# Patient Record
Sex: Male | Born: 1950
Health system: Southern US, Community
[De-identification: ages and names within clinical notes are randomized; demographics above are authoritative.]

## PROBLEM LIST (undated history)

## (undated) DIAGNOSIS — M545 Low back pain, unspecified: Secondary | ICD-10-CM

## (undated) DIAGNOSIS — G5600 Carpal tunnel syndrome, unspecified upper limb: Secondary | ICD-10-CM

## (undated) DIAGNOSIS — IMO0001 Reserved for inherently not codable concepts without codable children: Secondary | ICD-10-CM

## (undated) DIAGNOSIS — E669 Obesity, unspecified: Secondary | ICD-10-CM

## (undated) DIAGNOSIS — I10 Essential (primary) hypertension: Secondary | ICD-10-CM

## (undated) DIAGNOSIS — D693 Immune thrombocytopenic purpura: Secondary | ICD-10-CM

## (undated) DIAGNOSIS — M549 Dorsalgia, unspecified: Secondary | ICD-10-CM

## (undated) DIAGNOSIS — I2510 Atherosclerotic heart disease of native coronary artery without angina pectoris: Secondary | ICD-10-CM

## (undated) DIAGNOSIS — K219 Gastro-esophageal reflux disease without esophagitis: Secondary | ICD-10-CM

## (undated) DIAGNOSIS — S064XAA Epidural hemorrhage with loss of consciousness status unknown, initial encounter: Secondary | ICD-10-CM

## (undated) DIAGNOSIS — J189 Pneumonia, unspecified organism: Secondary | ICD-10-CM

## (undated) DIAGNOSIS — Z8719 Personal history of other diseases of the digestive system: Secondary | ICD-10-CM

## (undated) DIAGNOSIS — Z862 Personal history of diseases of the blood and blood-forming organs and certain disorders involving the immune mechanism: Secondary | ICD-10-CM

## (undated) DIAGNOSIS — G8929 Other chronic pain: Secondary | ICD-10-CM

## (undated) DIAGNOSIS — I25119 Atherosclerotic heart disease of native coronary artery with unspecified angina pectoris: Secondary | ICD-10-CM

## (undated) DIAGNOSIS — E7800 Pure hypercholesterolemia, unspecified: Secondary | ICD-10-CM

## (undated) DIAGNOSIS — E039 Hypothyroidism, unspecified: Secondary | ICD-10-CM

## (undated) DIAGNOSIS — I5042 Chronic combined systolic (congestive) and diastolic (congestive) heart failure: Secondary | ICD-10-CM

## (undated) DIAGNOSIS — T7840XA Allergy, unspecified, initial encounter: Secondary | ICD-10-CM

## (undated) DIAGNOSIS — Z951 Presence of aortocoronary bypass graft: Secondary | ICD-10-CM

## (undated) DIAGNOSIS — I219 Acute myocardial infarction, unspecified: Secondary | ICD-10-CM

## (undated) DIAGNOSIS — IMO0002 Reserved for concepts with insufficient information to code with codable children: Secondary | ICD-10-CM

## (undated) HISTORY — DX: Carpal tunnel syndrome, unspecified upper limb: G56.00

## (undated) HISTORY — PX: NECK SURGERY: SHX720

## (undated) HISTORY — PX: COLONOSCOPY: SHX174

## (undated) HISTORY — DX: Allergy, unspecified, initial encounter: T78.40XA

## (undated) HISTORY — PX: BACK SURGERY: SHX140

## (undated) HISTORY — DX: Low back pain, unspecified: M54.50

## (undated) HISTORY — PX: CORONARY ANGIOPLASTY WITH STENT PLACEMENT: SHX49

## (undated) HISTORY — DX: Low back pain: M54.5

## (undated) HISTORY — DX: Hypothyroidism, unspecified: E03.9

---

## 1998-07-15 ENCOUNTER — Ambulatory Visit (HOSPITAL_COMMUNITY): Admission: RE | Admit: 1998-07-15 | Discharge: 1998-07-15 | Payer: Self-pay | Admitting: Neurosurgery

## 1998-07-15 ENCOUNTER — Encounter: Payer: Self-pay | Admitting: Neurosurgery

## 1998-07-20 ENCOUNTER — Encounter: Payer: Self-pay | Admitting: Neurosurgery

## 1998-07-21 ENCOUNTER — Inpatient Hospital Stay (HOSPITAL_COMMUNITY): Admission: RE | Admit: 1998-07-21 | Discharge: 1998-07-22 | Payer: Self-pay | Admitting: Neurosurgery

## 1998-07-21 ENCOUNTER — Encounter: Payer: Self-pay | Admitting: Neurosurgery

## 1999-03-18 ENCOUNTER — Ambulatory Visit (HOSPITAL_COMMUNITY): Admission: RE | Admit: 1999-03-18 | Discharge: 1999-03-18 | Payer: Self-pay | Admitting: Neurosurgery

## 1999-03-18 ENCOUNTER — Encounter: Payer: Self-pay | Admitting: Neurosurgery

## 1999-05-27 ENCOUNTER — Observation Stay (HOSPITAL_COMMUNITY): Admission: AD | Admit: 1999-05-27 | Discharge: 1999-05-28 | Payer: Self-pay | Admitting: *Deleted

## 1999-05-27 ENCOUNTER — Encounter: Payer: Self-pay | Admitting: *Deleted

## 1999-09-17 ENCOUNTER — Encounter: Payer: Self-pay | Admitting: Neurosurgery

## 1999-09-17 ENCOUNTER — Ambulatory Visit (HOSPITAL_COMMUNITY): Admission: RE | Admit: 1999-09-17 | Discharge: 1999-09-18 | Payer: Self-pay | Admitting: Neurosurgery

## 2000-05-04 ENCOUNTER — Encounter: Payer: Self-pay | Admitting: Neurosurgery

## 2000-05-04 ENCOUNTER — Ambulatory Visit (HOSPITAL_COMMUNITY): Admission: RE | Admit: 2000-05-04 | Discharge: 2000-05-04 | Payer: Self-pay | Admitting: Neurosurgery

## 2000-05-18 ENCOUNTER — Ambulatory Visit (HOSPITAL_COMMUNITY): Admission: RE | Admit: 2000-05-18 | Discharge: 2000-05-18 | Payer: Self-pay | Admitting: Neurosurgery

## 2000-05-18 ENCOUNTER — Encounter: Payer: Self-pay | Admitting: Neurosurgery

## 2000-06-02 ENCOUNTER — Ambulatory Visit (HOSPITAL_COMMUNITY): Admission: RE | Admit: 2000-06-02 | Discharge: 2000-06-02 | Payer: Self-pay | Admitting: Neurosurgery

## 2000-06-02 ENCOUNTER — Encounter: Payer: Self-pay | Admitting: Neurosurgery

## 2000-08-15 ENCOUNTER — Encounter: Payer: Self-pay | Admitting: Neurosurgery

## 2000-08-15 ENCOUNTER — Ambulatory Visit (HOSPITAL_COMMUNITY): Admission: RE | Admit: 2000-08-15 | Discharge: 2000-08-15 | Payer: Self-pay | Admitting: Neurosurgery

## 2000-12-12 ENCOUNTER — Ambulatory Visit (HOSPITAL_COMMUNITY): Admission: RE | Admit: 2000-12-12 | Discharge: 2000-12-12 | Payer: Self-pay | Admitting: Neurosurgery

## 2000-12-12 ENCOUNTER — Encounter: Payer: Self-pay | Admitting: Neurosurgery

## 2000-12-19 ENCOUNTER — Ambulatory Visit: Admission: RE | Admit: 2000-12-19 | Discharge: 2000-12-19 | Payer: Self-pay | Admitting: Neurosurgery

## 2001-01-03 ENCOUNTER — Encounter: Payer: Self-pay | Admitting: Neurosurgery

## 2001-01-05 ENCOUNTER — Inpatient Hospital Stay (HOSPITAL_COMMUNITY): Admission: RE | Admit: 2001-01-05 | Discharge: 2001-01-10 | Payer: Self-pay | Admitting: Neurosurgery

## 2001-01-05 ENCOUNTER — Encounter: Payer: Self-pay | Admitting: Neurosurgery

## 2001-02-06 ENCOUNTER — Encounter (HOSPITAL_COMMUNITY): Admission: RE | Admit: 2001-02-06 | Discharge: 2001-03-08 | Payer: Self-pay | Admitting: Neurosurgery

## 2001-07-24 ENCOUNTER — Encounter: Payer: Self-pay | Admitting: *Deleted

## 2001-07-24 ENCOUNTER — Inpatient Hospital Stay (HOSPITAL_COMMUNITY): Admission: EM | Admit: 2001-07-24 | Discharge: 2001-07-26 | Payer: Self-pay | Admitting: *Deleted

## 2001-08-01 ENCOUNTER — Encounter (HOSPITAL_COMMUNITY): Admission: RE | Admit: 2001-08-01 | Discharge: 2001-08-31 | Payer: Self-pay | Admitting: Hematology & Oncology

## 2001-08-01 ENCOUNTER — Encounter: Admission: RE | Admit: 2001-08-01 | Discharge: 2001-08-01 | Payer: Self-pay | Admitting: Hematology & Oncology

## 2001-09-03 ENCOUNTER — Encounter: Admission: RE | Admit: 2001-09-03 | Discharge: 2001-09-03 | Payer: Self-pay | Admitting: Oncology

## 2001-09-03 ENCOUNTER — Encounter (HOSPITAL_COMMUNITY): Admission: RE | Admit: 2001-09-03 | Discharge: 2001-10-03 | Payer: Self-pay | Admitting: Oncology

## 2001-12-31 ENCOUNTER — Encounter: Admission: RE | Admit: 2001-12-31 | Discharge: 2001-12-31 | Payer: Self-pay | Admitting: Oncology

## 2001-12-31 ENCOUNTER — Encounter (HOSPITAL_COMMUNITY): Admission: RE | Admit: 2001-12-31 | Discharge: 2002-01-30 | Payer: Self-pay | Admitting: Oncology

## 2002-01-15 ENCOUNTER — Ambulatory Visit (HOSPITAL_COMMUNITY): Admission: RE | Admit: 2002-01-15 | Discharge: 2002-01-15 | Payer: Self-pay | Admitting: Neurosurgery

## 2002-01-15 ENCOUNTER — Encounter: Payer: Self-pay | Admitting: Neurosurgery

## 2002-01-29 ENCOUNTER — Encounter: Admission: RE | Admit: 2002-01-29 | Discharge: 2002-01-29 | Payer: Self-pay | Admitting: Neurosurgery

## 2002-01-29 ENCOUNTER — Encounter: Payer: Self-pay | Admitting: Neurosurgery

## 2002-02-12 ENCOUNTER — Encounter: Admission: RE | Admit: 2002-02-12 | Discharge: 2002-02-12 | Payer: Self-pay | Admitting: Neurosurgery

## 2002-02-12 ENCOUNTER — Encounter: Payer: Self-pay | Admitting: Neurosurgery

## 2002-02-26 ENCOUNTER — Encounter: Admission: RE | Admit: 2002-02-26 | Discharge: 2002-02-26 | Payer: Self-pay | Admitting: Neurosurgery

## 2002-02-26 ENCOUNTER — Encounter: Payer: Self-pay | Admitting: Neurosurgery

## 2002-03-10 ENCOUNTER — Emergency Department (HOSPITAL_COMMUNITY): Admission: EM | Admit: 2002-03-10 | Discharge: 2002-03-10 | Payer: Self-pay | Admitting: Internal Medicine

## 2002-03-10 ENCOUNTER — Inpatient Hospital Stay (HOSPITAL_COMMUNITY): Admission: EM | Admit: 2002-03-10 | Discharge: 2002-03-12 | Payer: Self-pay | Admitting: *Deleted

## 2002-03-10 ENCOUNTER — Encounter: Payer: Self-pay | Admitting: Internal Medicine

## 2002-05-24 ENCOUNTER — Observation Stay (HOSPITAL_COMMUNITY): Admission: EM | Admit: 2002-05-24 | Discharge: 2002-05-24 | Payer: Self-pay | Admitting: *Deleted

## 2002-07-02 ENCOUNTER — Encounter: Admission: RE | Admit: 2002-07-02 | Discharge: 2002-07-02 | Payer: Self-pay | Admitting: Oncology

## 2002-07-19 ENCOUNTER — Encounter: Payer: Self-pay | Admitting: Cardiology

## 2002-07-19 ENCOUNTER — Ambulatory Visit (HOSPITAL_COMMUNITY): Admission: RE | Admit: 2002-07-19 | Discharge: 2002-07-20 | Payer: Self-pay | Admitting: *Deleted

## 2002-07-25 ENCOUNTER — Emergency Department (HOSPITAL_COMMUNITY): Admission: EM | Admit: 2002-07-25 | Discharge: 2002-07-26 | Payer: Self-pay | Admitting: Emergency Medicine

## 2002-07-26 ENCOUNTER — Ambulatory Visit (HOSPITAL_COMMUNITY): Admission: RE | Admit: 2002-07-26 | Discharge: 2002-07-26 | Payer: Self-pay | Admitting: Cardiology

## 2002-08-07 ENCOUNTER — Ambulatory Visit (HOSPITAL_COMMUNITY): Admission: RE | Admit: 2002-08-07 | Discharge: 2002-08-07 | Payer: Self-pay | Admitting: *Deleted

## 2002-09-14 ENCOUNTER — Emergency Department (HOSPITAL_COMMUNITY): Admission: EM | Admit: 2002-09-14 | Discharge: 2002-09-14 | Payer: Self-pay | Admitting: *Deleted

## 2002-10-09 ENCOUNTER — Ambulatory Visit (HOSPITAL_COMMUNITY): Admission: RE | Admit: 2002-10-09 | Discharge: 2002-10-09 | Payer: Self-pay | Admitting: *Deleted

## 2002-10-09 ENCOUNTER — Encounter: Payer: Self-pay | Admitting: *Deleted

## 2002-10-17 ENCOUNTER — Ambulatory Visit (HOSPITAL_COMMUNITY): Admission: RE | Admit: 2002-10-17 | Discharge: 2002-10-17 | Payer: Self-pay | Admitting: Neurosurgery

## 2002-10-17 ENCOUNTER — Encounter: Payer: Self-pay | Admitting: Neurosurgery

## 2002-11-27 ENCOUNTER — Inpatient Hospital Stay (HOSPITAL_COMMUNITY): Admission: RE | Admit: 2002-11-27 | Discharge: 2002-11-30 | Payer: Self-pay | Admitting: Neurosurgery

## 2002-11-27 ENCOUNTER — Encounter: Payer: Self-pay | Admitting: Neurosurgery

## 2002-11-29 ENCOUNTER — Encounter: Payer: Self-pay | Admitting: Neurosurgery

## 2002-12-31 ENCOUNTER — Encounter: Admission: RE | Admit: 2002-12-31 | Discharge: 2002-12-31 | Payer: Self-pay | Admitting: Oncology

## 2003-03-05 ENCOUNTER — Encounter: Payer: Self-pay | Admitting: Family Medicine

## 2003-03-05 ENCOUNTER — Ambulatory Visit (HOSPITAL_COMMUNITY): Admission: RE | Admit: 2003-03-05 | Discharge: 2003-03-05 | Payer: Self-pay | Admitting: Family Medicine

## 2003-05-09 ENCOUNTER — Encounter: Payer: Self-pay | Admitting: Emergency Medicine

## 2003-05-09 ENCOUNTER — Emergency Department (HOSPITAL_COMMUNITY): Admission: EM | Admit: 2003-05-09 | Discharge: 2003-05-09 | Payer: Self-pay | Admitting: Emergency Medicine

## 2003-06-05 ENCOUNTER — Ambulatory Visit (HOSPITAL_COMMUNITY): Admission: RE | Admit: 2003-06-05 | Discharge: 2003-06-05 | Payer: Self-pay | Admitting: Neurosurgery

## 2003-06-05 ENCOUNTER — Encounter: Payer: Self-pay | Admitting: Neurosurgery

## 2003-06-24 ENCOUNTER — Encounter: Payer: Self-pay | Admitting: Neurosurgery

## 2003-06-24 ENCOUNTER — Encounter: Admission: RE | Admit: 2003-06-24 | Discharge: 2003-06-24 | Payer: Self-pay | Admitting: Neurosurgery

## 2003-07-08 ENCOUNTER — Encounter: Payer: Self-pay | Admitting: Neurosurgery

## 2003-07-08 ENCOUNTER — Encounter: Admission: RE | Admit: 2003-07-08 | Discharge: 2003-07-08 | Payer: Self-pay | Admitting: Neurosurgery

## 2003-10-07 ENCOUNTER — Inpatient Hospital Stay (HOSPITAL_COMMUNITY): Admission: RE | Admit: 2003-10-07 | Discharge: 2003-10-11 | Payer: Self-pay | Admitting: Neurosurgery

## 2004-06-02 ENCOUNTER — Encounter: Admission: RE | Admit: 2004-06-02 | Discharge: 2004-06-02 | Payer: Self-pay | Admitting: Oncology

## 2004-12-24 ENCOUNTER — Ambulatory Visit (HOSPITAL_COMMUNITY): Admission: RE | Admit: 2004-12-24 | Discharge: 2004-12-24 | Payer: Self-pay | Admitting: Family Medicine

## 2005-01-12 ENCOUNTER — Ambulatory Visit: Payer: Self-pay | Admitting: *Deleted

## 2005-01-13 ENCOUNTER — Ambulatory Visit (HOSPITAL_COMMUNITY): Admission: RE | Admit: 2005-01-13 | Discharge: 2005-01-13 | Payer: Self-pay | Admitting: Family Medicine

## 2005-01-26 ENCOUNTER — Ambulatory Visit: Payer: Self-pay | Admitting: Orthopedic Surgery

## 2005-02-18 ENCOUNTER — Ambulatory Visit: Payer: Self-pay | Admitting: Orthopedic Surgery

## 2005-02-18 ENCOUNTER — Ambulatory Visit (HOSPITAL_COMMUNITY): Admission: RE | Admit: 2005-02-18 | Discharge: 2005-02-18 | Payer: Self-pay | Admitting: Orthopedic Surgery

## 2005-02-21 ENCOUNTER — Ambulatory Visit: Payer: Self-pay | Admitting: Orthopedic Surgery

## 2005-02-23 ENCOUNTER — Encounter (HOSPITAL_COMMUNITY): Admission: RE | Admit: 2005-02-23 | Discharge: 2005-03-25 | Payer: Self-pay | Admitting: Orthopedic Surgery

## 2005-03-14 ENCOUNTER — Ambulatory Visit: Payer: Self-pay | Admitting: Orthopedic Surgery

## 2005-04-06 ENCOUNTER — Ambulatory Visit (HOSPITAL_COMMUNITY): Admission: RE | Admit: 2005-04-06 | Discharge: 2005-04-06 | Payer: Self-pay | Admitting: Family Medicine

## 2005-04-20 ENCOUNTER — Ambulatory Visit (HOSPITAL_COMMUNITY): Admission: RE | Admit: 2005-04-20 | Discharge: 2005-04-20 | Payer: Self-pay | Admitting: Neurosurgery

## 2005-07-06 ENCOUNTER — Encounter: Admission: RE | Admit: 2005-07-06 | Discharge: 2005-07-29 | Payer: Self-pay | Admitting: Oncology

## 2005-07-06 ENCOUNTER — Encounter (HOSPITAL_COMMUNITY): Admission: RE | Admit: 2005-07-06 | Discharge: 2005-07-29 | Payer: Self-pay | Admitting: Oncology

## 2005-07-06 ENCOUNTER — Ambulatory Visit (HOSPITAL_COMMUNITY): Payer: Self-pay | Admitting: Oncology

## 2006-07-11 ENCOUNTER — Ambulatory Visit (HOSPITAL_COMMUNITY): Payer: Self-pay | Admitting: Oncology

## 2006-09-15 ENCOUNTER — Ambulatory Visit (HOSPITAL_COMMUNITY): Admission: RE | Admit: 2006-09-15 | Discharge: 2006-09-15 | Payer: Self-pay | Admitting: Family Medicine

## 2007-01-29 ENCOUNTER — Inpatient Hospital Stay (HOSPITAL_COMMUNITY): Admission: AD | Admit: 2007-01-29 | Discharge: 2007-02-02 | Payer: Self-pay | Admitting: Family Medicine

## 2007-03-08 ENCOUNTER — Ambulatory Visit: Payer: Self-pay | Admitting: Orthopedic Surgery

## 2007-03-15 ENCOUNTER — Ambulatory Visit: Payer: Self-pay | Admitting: Orthopedic Surgery

## 2007-04-09 ENCOUNTER — Ambulatory Visit: Payer: Self-pay | Admitting: Orthopedic Surgery

## 2007-07-09 ENCOUNTER — Ambulatory Visit: Payer: Self-pay | Admitting: Orthopedic Surgery

## 2007-07-30 ENCOUNTER — Ambulatory Visit: Admission: RE | Admit: 2007-07-30 | Discharge: 2007-07-30 | Payer: Self-pay | Admitting: Family Medicine

## 2007-09-10 ENCOUNTER — Ambulatory Visit (HOSPITAL_COMMUNITY): Admission: RE | Admit: 2007-09-10 | Discharge: 2007-09-10 | Payer: Self-pay | Admitting: Anesthesiology

## 2007-10-09 DIAGNOSIS — I10 Essential (primary) hypertension: Secondary | ICD-10-CM | POA: Insufficient documentation

## 2007-10-10 ENCOUNTER — Ambulatory Visit: Payer: Self-pay | Admitting: Orthopedic Surgery

## 2007-10-10 DIAGNOSIS — M25569 Pain in unspecified knee: Secondary | ICD-10-CM | POA: Insufficient documentation

## 2007-11-01 DIAGNOSIS — I219 Acute myocardial infarction, unspecified: Secondary | ICD-10-CM

## 2007-11-01 HISTORY — PX: LUMBAR FUSION: SHX111

## 2007-11-01 HISTORY — DX: Acute myocardial infarction, unspecified: I21.9

## 2008-01-16 ENCOUNTER — Ambulatory Visit: Payer: Self-pay | Admitting: Orthopedic Surgery

## 2008-02-18 ENCOUNTER — Ambulatory Visit (HOSPITAL_COMMUNITY): Admission: RE | Admit: 2008-02-18 | Discharge: 2008-02-18 | Payer: Self-pay | Admitting: Family Medicine

## 2008-02-20 ENCOUNTER — Ambulatory Visit: Payer: Self-pay | Admitting: Cardiovascular Disease

## 2008-02-26 ENCOUNTER — Ambulatory Visit: Payer: Self-pay | Admitting: Cardiovascular Disease

## 2008-02-26 ENCOUNTER — Encounter (HOSPITAL_COMMUNITY): Admission: RE | Admit: 2008-02-26 | Discharge: 2008-03-27 | Payer: Self-pay | Admitting: Cardiovascular Disease

## 2008-02-28 ENCOUNTER — Ambulatory Visit (HOSPITAL_COMMUNITY): Admission: RE | Admit: 2008-02-28 | Discharge: 2008-02-28 | Payer: Self-pay | Admitting: Neurosurgery

## 2008-03-05 ENCOUNTER — Inpatient Hospital Stay (HOSPITAL_COMMUNITY): Admission: RE | Admit: 2008-03-05 | Discharge: 2008-03-12 | Payer: Self-pay | Admitting: Neurosurgery

## 2008-04-30 ENCOUNTER — Ambulatory Visit: Payer: Self-pay | Admitting: Orthopedic Surgery

## 2008-04-30 DIAGNOSIS — M25469 Effusion, unspecified knee: Secondary | ICD-10-CM | POA: Insufficient documentation

## 2008-05-29 ENCOUNTER — Ambulatory Visit: Payer: Self-pay | Admitting: Orthopedic Surgery

## 2008-05-29 DIAGNOSIS — IMO0002 Reserved for concepts with insufficient information to code with codable children: Secondary | ICD-10-CM | POA: Insufficient documentation

## 2008-05-29 DIAGNOSIS — M1710 Unilateral primary osteoarthritis, unspecified knee: Secondary | ICD-10-CM

## 2008-07-01 ENCOUNTER — Encounter: Payer: Self-pay | Admitting: Orthopedic Surgery

## 2008-07-23 ENCOUNTER — Ambulatory Visit: Payer: Self-pay | Admitting: Orthopedic Surgery

## 2008-07-23 DIAGNOSIS — G8929 Other chronic pain: Secondary | ICD-10-CM

## 2008-07-23 DIAGNOSIS — M549 Dorsalgia, unspecified: Secondary | ICD-10-CM

## 2008-07-28 ENCOUNTER — Telehealth: Payer: Self-pay | Admitting: Orthopedic Surgery

## 2008-07-29 ENCOUNTER — Encounter: Payer: Self-pay | Admitting: Orthopedic Surgery

## 2008-07-30 ENCOUNTER — Encounter: Payer: Self-pay | Admitting: Orthopedic Surgery

## 2008-08-05 ENCOUNTER — Ambulatory Visit: Payer: Self-pay | Admitting: Orthopedic Surgery

## 2008-08-05 ENCOUNTER — Inpatient Hospital Stay (HOSPITAL_COMMUNITY): Admission: RE | Admit: 2008-08-05 | Discharge: 2008-08-11 | Payer: Self-pay | Admitting: Orthopedic Surgery

## 2008-08-11 ENCOUNTER — Telehealth: Payer: Self-pay | Admitting: Orthopedic Surgery

## 2008-08-11 ENCOUNTER — Encounter: Payer: Self-pay | Admitting: Orthopedic Surgery

## 2008-08-12 ENCOUNTER — Encounter: Payer: Self-pay | Admitting: Orthopedic Surgery

## 2008-08-14 ENCOUNTER — Ambulatory Visit: Payer: Self-pay | Admitting: Orthopedic Surgery

## 2008-08-14 ENCOUNTER — Telehealth: Payer: Self-pay | Admitting: Orthopedic Surgery

## 2008-08-14 DIAGNOSIS — Z96659 Presence of unspecified artificial knee joint: Secondary | ICD-10-CM | POA: Insufficient documentation

## 2008-08-19 ENCOUNTER — Ambulatory Visit: Payer: Self-pay | Admitting: Orthopedic Surgery

## 2008-08-25 ENCOUNTER — Encounter: Payer: Self-pay | Admitting: Orthopedic Surgery

## 2008-08-28 ENCOUNTER — Ambulatory Visit: Payer: Self-pay | Admitting: Orthopedic Surgery

## 2008-09-04 ENCOUNTER — Ambulatory Visit: Payer: Self-pay | Admitting: Orthopedic Surgery

## 2008-09-10 ENCOUNTER — Ambulatory Visit: Payer: Self-pay | Admitting: Orthopedic Surgery

## 2008-09-26 ENCOUNTER — Encounter: Payer: Self-pay | Admitting: Orthopedic Surgery

## 2008-10-02 ENCOUNTER — Ambulatory Visit: Payer: Self-pay | Admitting: Orthopedic Surgery

## 2008-12-31 ENCOUNTER — Ambulatory Visit: Payer: Self-pay | Admitting: Orthopedic Surgery

## 2009-04-29 ENCOUNTER — Emergency Department (HOSPITAL_COMMUNITY): Admission: EM | Admit: 2009-04-29 | Discharge: 2009-04-29 | Payer: Self-pay | Admitting: Emergency Medicine

## 2009-05-05 ENCOUNTER — Encounter: Admission: RE | Admit: 2009-05-05 | Discharge: 2009-05-05 | Payer: Self-pay | Admitting: Neurosurgery

## 2009-06-19 ENCOUNTER — Inpatient Hospital Stay (HOSPITAL_COMMUNITY): Admission: RE | Admit: 2009-06-19 | Discharge: 2009-06-29 | Payer: Self-pay | Admitting: Neurosurgery

## 2009-06-24 ENCOUNTER — Ambulatory Visit: Payer: Self-pay | Admitting: Physical Medicine & Rehabilitation

## 2009-08-05 ENCOUNTER — Encounter: Payer: Self-pay | Admitting: Orthopedic Surgery

## 2009-09-11 ENCOUNTER — Ambulatory Visit (HOSPITAL_BASED_OUTPATIENT_CLINIC_OR_DEPARTMENT_OTHER): Admission: RE | Admit: 2009-09-11 | Discharge: 2009-09-11 | Payer: Self-pay | Admitting: Urology

## 2009-10-31 HISTORY — PX: JOINT REPLACEMENT: SHX530

## 2010-12-28 ENCOUNTER — Other Ambulatory Visit: Payer: Self-pay | Admitting: Anesthesiology

## 2010-12-28 DIAGNOSIS — M79605 Pain in left leg: Secondary | ICD-10-CM

## 2010-12-28 DIAGNOSIS — R29898 Other symptoms and signs involving the musculoskeletal system: Secondary | ICD-10-CM

## 2010-12-29 ENCOUNTER — Ambulatory Visit
Admission: RE | Admit: 2010-12-29 | Discharge: 2010-12-29 | Disposition: A | Discharge status: Home / Self Care | Payer: No Typology Code available for payment source | Source: Ambulatory Visit | Attending: Anesthesiology | Admitting: Anesthesiology

## 2010-12-29 DIAGNOSIS — R29898 Other symptoms and signs involving the musculoskeletal system: Secondary | ICD-10-CM

## 2010-12-29 DIAGNOSIS — M79605 Pain in left leg: Secondary | ICD-10-CM

## 2011-01-19 ENCOUNTER — Other Ambulatory Visit (HOSPITAL_COMMUNITY): Payer: Self-pay | Admitting: Family Medicine

## 2011-01-19 DIAGNOSIS — R600 Localized edema: Secondary | ICD-10-CM

## 2011-01-21 ENCOUNTER — Other Ambulatory Visit (HOSPITAL_COMMUNITY): Payer: No Typology Code available for payment source

## 2011-01-24 ENCOUNTER — Ambulatory Visit (HOSPITAL_COMMUNITY)
Admission: RE | Admit: 2011-01-24 | Discharge: 2011-01-24 | Disposition: A | Discharge status: Home / Self Care | Payer: No Typology Code available for payment source | Source: Ambulatory Visit | Attending: Family Medicine | Admitting: Family Medicine

## 2011-01-24 ENCOUNTER — Encounter (HOSPITAL_COMMUNITY): Payer: Self-pay

## 2011-01-24 DIAGNOSIS — R9389 Abnormal findings on diagnostic imaging of other specified body structures: Secondary | ICD-10-CM | POA: Insufficient documentation

## 2011-01-24 DIAGNOSIS — R935 Abnormal findings on diagnostic imaging of other abdominal regions, including retroperitoneum: Secondary | ICD-10-CM | POA: Insufficient documentation

## 2011-01-24 DIAGNOSIS — R600 Localized edema: Secondary | ICD-10-CM

## 2011-01-24 DIAGNOSIS — R609 Edema, unspecified: Secondary | ICD-10-CM | POA: Insufficient documentation

## 2011-01-24 HISTORY — DX: Essential (primary) hypertension: I10

## 2011-01-24 MED ORDER — IOHEXOL 300 MG/ML  SOLN
100.0000 mL | Freq: Once | INTRAMUSCULAR | Status: AC | PRN
  Administered 2011-01-24: 100 mL via INTRAVENOUS

## 2011-02-02 LAB — POCT I-STAT 4, (NA,K, GLUC, HGB,HCT)
Glucose, Bld: 100 mg/dL — ABNORMAL HIGH (ref 70–99)
HCT: 37 % — ABNORMAL LOW (ref 39.0–52.0)
Hemoglobin: 12.6 g/dL — ABNORMAL LOW (ref 13.0–17.0)
Potassium: 4.2 mEq/L (ref 3.5–5.1)
Sodium: 138 mEq/L (ref 135–145)

## 2011-02-05 LAB — URINALYSIS, ROUTINE W REFLEX MICROSCOPIC
Glucose, UA: NEGATIVE mg/dL
Protein, ur: NEGATIVE mg/dL
Urobilinogen, UA: 1 mg/dL (ref 0.0–1.0)

## 2011-02-05 LAB — CBC
HCT: 38.1 % — ABNORMAL LOW (ref 39.0–52.0)
Hemoglobin: 12.8 g/dL — ABNORMAL LOW (ref 13.0–17.0)
MCV: 95.3 fL (ref 78.0–100.0)
RDW: 14.5 % (ref 11.5–15.5)

## 2011-02-05 LAB — BASIC METABOLIC PANEL
Chloride: 102 mEq/L (ref 96–112)
GFR calc non Af Amer: >60 mL/min (ref >60)
Glucose, Bld: 112 mg/dL — ABNORMAL HIGH (ref 70–99)
Potassium: 4.2 mEq/L (ref 3.5–5.1)
Sodium: 137 mEq/L (ref 135–145)

## 2011-02-05 LAB — URINE CULTURE
Colony Count: NO GROWTH
Culture: NO GROWTH

## 2011-03-15 NOTE — Consult Note (Signed)
NAMEDEAVEN, [name]                 ACCOUNT NO.:  [id number]   MEDICAL RECORD NO.:  [id number]          PATIENT TYPE:  INP   LOCATION:  3040                         FACILITY:  [facility]   PHYSICIAN:  Mark C. Vernie Ammons, M.D.  DATE OF BIRTH:  [birth year]   DATE OF CONSULTATION:  06/25/2009  DATE OF DISCHARGE:                                 CONSULTATION   [name] is a 60 year old male patient, who is known to me.  He is seen  today in hospital consultation for further evaluation of urinary  retention.  The patient has not been able to void spontaneously since he  was admitted to the hospital for an elective lumbar laminectomy and  fusion on June 19, 2009.  He has been experiencing significant pain in  his back and has been undergoing intermittent self-catheterization every  4 hours, but has not begun to void spontaneously.   He has a history of urinary retention, and I saw him in hospital  consultation in March 2000, for this same problem.  Since I have seen  him last, he reports that he has slowly developed a slow, weak stream  with associated hesitancy, but denies spraying or splitting of his  stream.  He reports he has no nocturia and says he can go all day with  only having to urinate twice typically.  This is of mild-to-moderate  severity, and it appears his pain and need for narcotics are modifying  factors.  He also has a history of an elevated PSA, but has been  followed for this by [name] and notes his PSAs have looked good.   Past medical history is positive for hypertension, coronary artery  disease, MI, and multiple back surgeries.  He has had a cardiac stent  placed in the past.  He also has chronic back pain.   ALLERGIES:  SULFA and PENICILLIN.   MEDICATIONS:  Aspirin, Plavix, quinapril, doxazosin, Lasix, simvastatin,  fexofenadine, gabapentin, omeprazole, Zyrtec, nadolol, Niaspan, as well  as a fentanyl patch, oxycodone, and diazepam.   SOCIAL HISTORY:  Denies  tobacco or ethanol use.   FAMILY HISTORY:  Negative for GU malignancy or renal disease.   REVIEW OF SYSTEMS:  As noted above, otherwise his 13-point review of  systems is negative.   PHYSICAL EXAMINATION:  VITAL SIGNS:  Temperature is 98.5, pulse 64,  blood pressure is 110/68.  GENERAL:  The patient is a well-developed, well-nourished white male,  who appears to be in mild distress due to back pain.  HEENT:  Atraumatic, normocephalic.  Oropharynx clear.  NECK:  Supple with midline trachea.  CHEST:  Normal respiratory effort.  CARDIOVASCULAR:  Regular rate and rhythm.  ABDOMEN:  Soft and nontender without mass or HSM.  PELVIC:  He has no inguinal hernias or adenopathy.  He has a circumcised  phallus with a distal shaft hypospadias that is widely patent.  Scrotum,  testicles, epididymis, anus, and perineum are normal.  He has normal  rectal tone.  His prostate is small, smooth, symmetric, and has no  suspicious nodularity or any induration.  There is  no tenderness.  EXTREMITIES:  Without clubbing, cyanosis, edema.  NEUROLOGIC:  He is alert and oriented with appropriate mood and affect  and no gross focal neurologic deficits.   His creatinine is normal at 1.0.   IMPRESSION:  1. Hypospadias.  His meatus is widely patent and he urinates with a      forwardly directed stream and therefore no surgical treatment is      necessary.  2. He does have minimal benign prostatic hypertrophy, but more      importantly, it sounds like, by his history, that he has bladder      hypotonicity.  That sounds chronic and may be in part neurologic,      but also I think his narcotic pain medications are contributing to      this.  3. Acute urinary retention.  This has undoubtedly been precipitated by      the fact that he has bladder hypotonicity as well as the need for      narcotics and his pain.  He has been undergoing intermittent      catheterization, and we discussed placing a Foley catheter  in.      This has occurred in the past, and it has been managed with Foley      catheter temporarily with spontaneous voiding once the patient was      out of the hospital, and I suspect a similar result will occur at      this time.  I will add alpha blocker to augment his Cardura.  4. He has a history of elevated PSA, although this has been followed      by [name], and he reports his PSA has been normal.   PLAN:  1. I will have a Foley catheter placed today, and this will remain      indwelling during his hospitalization and will be removed as an      outpatient.  2. Add tamsulosin 0.4 mg to his 4 mg of Cardura for maximum alpha      blockade.  3. His voiding trial will be undertaken once his pain has decreased as      well as his narcotic need.  4. He will follow up with me as an outpatient for long-term management      of his bladder and possible urodynamics.      Mark C. Vernie Ammons, M.D.  Electronically Signed     MCO/MEDQ  D:  06/25/2009  T:  06/26/2009  Job:  [id number]   cc:   Hilda Lias, M.D.

## 2011-03-15 NOTE — H&P (Signed)
[name], [name]                 ACCOUNT NO.:  [id number]   MEDICAL RECORD NO.:  [id number]          PATIENT TYPE:  INP   LOCATION:  3034                         FACILITY:  [facility]   PHYSICIAN:  Hilda Lias, M.D.   DATE OF BIRTH:  December 24, 1950   DATE OF ADMISSION:  03/05/2008  DATE OF DISCHARGE:                              HISTORY & PHYSICAL   Mr. Dredge is a gentleman  with a lumbar fusion at the level of L4-L5, L5-  S1.  I have not seen him in a long while, but luckily he comes  complaining of back pain, quadriceps of both legs associated with  weakness with __________ of the quadriceps.  We did a myelogram that  showed severe stenosis at the L3-4.  He is being admitted for surgery.   PAST MEDICAL HISTORY:  Lumbar fusion L4-L5 and L5-S1.  He had a ear  surgery.   REVIEW OF SYSTEMS:  Positive for back and neck pain.   PHYSICAL EXAMINATION:  The patient came to my office, and he was walking  with a short step.  He was using a walker and a cane.   PHYSICAL EXAMINATION:  HEAD, EARS, NOSE, AND THROAT:  Normal.  NECK:  Normal.  LUNGS:  Clear.  HEART:  Normal.  ABDOMEN:  Normal.  EXTREMITIES:  Normal pulses.  There is a scar in the lumbar area from  previous surgery.  NEURO:  He certainly has weakness of the quadriceps and iliopsoas.  The  straight leg raising is positive at 80 degrees.  Femoral stretch  maneuver is positive bilaterally.   Lumbar spine x-ray showed that he has stenosis at the level of L3-L4  with fusion at the level of L4-L5 and L5-S1 with position of the pedicle  screws in good location.   CLINICAL IMPRESSION:  1. Lumbar stenosis, L3-L4.  2. Status post lumbar fusion, L4-L5 and L5-S1.   RECOMMENDATIONS:  The patient being admitted for surgery.  Procedure  would be bilateral __________ prelaminectomy with foraminotomy.  We are  going to do a posterolateral arthrodesis.  A decision about having to do  pedicle screw will be made during surgery.  He and his wife  knew the  risks such as __________  infection, CSF leak, worsening pain,  paralysis, and need for the surgery.           ______________________________  Hilda Lias, M.D.     EB/MEDQ  D:  03/05/2008  T:  03/06/2008  Job:  [id number]

## 2011-03-15 NOTE — H&P (Signed)
[name], [name]                 ACCOUNT NO.:  [id number]   MEDICAL RECORD NO.:  [id number]          PATIENT TYPE:  AMB   LOCATION:  DAY                           FACILITY:  [facility]   PHYSICIAN:  Vickki Hearing, M.D.DATE OF BIRTH:  Feb 15, 1951   DATE OF ADMISSION:  DATE OF DISCHARGE:  LH                              HISTORY & PHYSICAL   CHIEF COMPLAINT:  Left knee pain.   HISTORY:  [name] is a 60 year old male status post neck surgery, 4  lumbar spine surgeries by Dr. Jeral Fruit, status post stent placement in  heart with a history of hypertension, heart disease, and chronic pain  who has end-stage osteoarthritis of his left knee.   This patient has been treated with multiple aspirations and injections  as well as arthroscopy of the left knee but continued to have pain and a  feeling like his left leg is going in and out of joint.  His recurrent  effusions have been problematic as well.   He has definite chronic pain issues as he is on Fentanyl patches,  Lidoderm patches, and oxycodone 7.5 mg every 6 hours p.r.n.  See med  list.  However, he has failed conservative treatment in reference to his  left knee osteoarthritis, has x-ray findings consistent with a severely  diseased knee and wishes to go ahead with knee replacement surgery.   I have made a concerted effort to explain to him that he must  participate in the rehabilitation program or he will have a poor result  with a stiff painful knee.  I told them that he will have pain  management issues secondary to his narcotic use and tolerance.  Informed  consent process also occluded a discussion regarding bleeding,  infection, deep vein thrombosis, stiffness, continued pain which may be  a component of his lumbar disk disease.   ALLERGIES:  PENICILLIN.   MEDICATIONS:  1. Over-the-counter multivitamin.  2. Aspirin stopped on July 21, 2008.  3. Plavix stopped on July 21, 2008.  4. Quinapril 10 mg.  5. Doxazosin  4 mg.  6. Furosemide 40 mg.  7. Simvastatin 80 mg.  8. Fexofenadine 180 mg.  9. Gabapentin 300 mg.  10.Omeprazole 20 mg.  All of those medicines a taken in the morning.  11.In the afternoon he takes gabapentin 300 mg.  12.At bedtime he takes Zyrtec 10 mg.  13.Quinapril 10 mg.  14.Omeprazole 20 mg.  15.Gabapentin 30 mg.  16.Nadolol 40 mg.  17.Niaspan 500 mg.  18.He takes a fentanyl patch 50 mcg every other day.  19.Oxycodone 7.5/325 one every 6 hours p.r.n. pain.  20.Valium 5 mg 1 every 6 to 8 hours as needed for spasms.   MEDICAL AND SURGICAL HISTORY:  Have been recorded.   FAMILY HISTORY:  Noncontributory.   SOCIAL HISTORY:  He is married and has a supportive spouse   REVIEW OF SYSTEMS:  He reports chronic and longstanding numbness in his  left leg and weakness in his left leg.  Otherwise, other than some  sinusitis, all other systems are negative.   EXAMINATION:  VITAL  SIGNS:  Stable.  They will be repeated prior to  surgery.  CONSTITUTIONAL:  His appearance is normal.  His body habitus is medium  to large.  CARDIOVASCULAR:  On observation, there is no swelling or varicose veins.  On palpation, pulses are normal.  Temperature is warm.  There is no  edema or tenderness.  LYMPHATICS:  Normal in the cervical spine as well as the groin.  MUSCULOSKELETAL:  Examination of gait and station reveal an antalgic  left lower extremity gait with a varus knee.  EXTREMITIES:  Lower extremities inspection of the left knee reveals  medial joint line tenderness with left knee effusion.  Flexion 115  degrees, -5 degrees of extension.  Medial joint line is significantly  tender.  He has varus deformity.  He has 5-/5 extension power.  Upper  extremities show normal range of motion, strength, stability and  alignment and his right knee and right lower extremity show no atrophy,  tremor subluxation instability or effusion.  SKIN:  He has some skin lesions which are chronic and are of no  major  consequence.  They have been there all of his life and they have been  worked up.  NEUROLOGIC:  His deep tendon reflexes are normal in the knee and ankle  with 2+ at the knee and 1+ at the ankle.  They are symmetric and equal.  He has decreased sensation in his lower leg, mainly in the L4-L5  dermatomes on the left.  He is oriented x3.  His mood is pleasant.  Coordination is normal.   IMPRESSION:  He has osteoarthritis and pain to the left knee 715.96 and  715.16.  He has chronic back pain 724.5.   PLAN:  Katrinka Blazing and Nephew left total knee arthroplasty at [facility] on October 6.  He is following up on October 20 at 1:45 p.m.      Vickki Hearing, M.D.  Electronically Signed     SEH/MEDQ  D:  08/04/2008  T:  08/04/2008  Job:  [id number]   cc:   [facility]

## 2011-03-15 NOTE — Discharge Summary (Signed)
[name], Burns                 ACCOUNT NO.:  [id number]   MEDICAL RECORD NO.:  [id number]          PATIENT TYPE:  INP   LOCATION:  3034                         FACILITY:  [facility]   PHYSICIAN:  Hilda Lias, M.D.   DATE OF BIRTH:  April 27, 1951   DATE OF ADMISSION:  03/05/2008  DATE OF DISCHARGE:  03/12/2008                               DISCHARGE SUMMARY   ADMISSION DIAGNOSIS:  L3-L4 stenosis, status post L4-L5, L5-S1 fusion.   FINAL DIAGNOSIS:  L3-L4 stenosis, status post L4-L5, L5-S1 fusion.   CLINICAL HISTORY:  The patient was admitted because of back pain racing  to both the legs.  X-ray shows severe stenosis at the L3-L4.  The  previous area where he had the surgery was normal.  The surgery was  advised.  Laboratory normal.   COURSE IN THE HOSPITAL:  The patient underwent L3 laminectomy with  posterolateral fusion.  The patient had quite a bit of discomfort in the  beginning.  At the end, we added fentanyl patch, which he was taking  prior to surgery and the pain decreased.  He has been able to ambulate  with minimal discomfort.  He is being discharged today to be followed by  me in my office.   CONDITION ON DISCHARGE:  Improvement.   MEDICATIONS:  Percocet and diazepam.  He will continue taking his own  fentanyl as prescribed by his medical doctor.   DIET:  Regular.   ACTIVITY:  Not to drive for at least a week.   FOLLOWUP:  To be seen by me in 4 weeks or before.           ______________________________  Hilda Lias, M.D.     EB/MEDQ  D:  03/12/2008  T:  03/13/2008  Job:  [id number]

## 2011-03-15 NOTE — Op Note (Signed)
NAMEOBINNA, EHRESMAN                 ACCOUNT NO.:  [id number]   MEDICAL RECORD NO.:  [id number]          PATIENT TYPE:  INP   LOCATION:  3040                         FACILITY:  [facility]   PHYSICIAN:  Hilda Lias, M.D.   DATE OF BIRTH:  14-Jun-1951   DATE OF PROCEDURE:  06/19/2009  DATE OF DISCHARGE:                               OPERATIVE REPORT   PREOPERATIVE DIAGNOSIS:  L2-L3 stenosis status post fusion 4-5, 5-1,  laminectomy, L3.   POSTOPERATIVE DIAGNOSIS:  L2-L3 stenosis status post fusion 4-5, 5-1,  laminectomy, L3.   PROCEDURE:  Bilateral L2 laminectomy, lysis of adhesion, decompression  of the thecal sac, foraminotomy 2-3 and 3-4, microscope, posterolateral  arthrodesis with BMP and autograft.   SURGEON:  Hilda Lias, MD   ASSISTANT:  Danae Orleans. Venetia Maxon, MD   CLINICAL HISTORY:  Mr. Goates is a gentleman who in the past underwent  fusion at L4-5, 5-1 with laminectomy of L3.  Now, he is complaining of  back pain with radiation to both legs.  X-ray showed stenosis at the  level of 2-3.  Surgery was advised in view of non-improvement with  medication.   DESCRIPTION OF PROCEDURE:  The patient was taken to the OR, and after  intubation he was positioned in a prone manner.  The back was cleaned  with DuraPrep.  Drapes were applied.  Immediately, we felt the spinal  process of L2, and midline incision from L1-2 down through below was  made.  We detached the muscle from the lamina of L2 with Leksell and  drill.  We removed the spinous process of L2 and the lamina of this  level.  We found that it was quite narrow here, but the most narrow was  below.  The patient had quite a bit of adhesion.  It took [place] at least 45  minutes to be able to remove the scar tissue and decompress the thecal  sac all the way down until the upper part of L4.  With the drill and the  2 mm Kerrison punch we went laterally, we removed a part of the yellow  ligament at the scar tissue.  We had good  decompression of the foramina  of L2-3 and L3-4.  X-ray showed that indeed we were at the right area.  At the end, had a good decompression, the area was irrigated.  We went  laterally, and we removed the periosteum of the facet of 2-3 as well as  the transverse process, and a mix of BMP and autograft was used for  arthrodesis.  From there on, the wound was closed with Vicryl and  staples.           ______________________________  Hilda Lias, M.D.     EB/MEDQ  D:  06/19/2009  T:  06/20/2009  Job:  [id number]

## 2011-03-15 NOTE — Assessment & Plan Note (Signed)
[facility]                       [place] CARDIOLOGY OFFICE NOTE   Oscar [name], [name]                        MRN:          [id number]  DATE:02/20/2008                            DOB:          07-Feb-1951    Mr. Oscar [name] [name] is a 60 year old patient referred for preop clearance.  The  patient needs to have back surgery and possible stimulator implant by  Dr. Jeral Fruit.  In fact, he has already stopped his Plavix for a myelogram  next week.  The patient has previously been seen by Dr. Chales Abrahams and Dr.  Gerri Spore.  He has had stents placed to the LAD and OM.  He has an  occlusion of the distal right that is collateralize.  His last cath  appears to have been in 2003 at which time he had patent stents and a  chronically occluded right.   His Plavix was to be continued long-term per Dr. Andee Lineman.   The patient has had extreme back pain.  He is very limited.  He has had  injections of steroids and had oral steroids.  He is on fentanyl patch  and Valium-like compounds.  He is miserable.   It is not clear whether he has a compression in his spinal cord or just  a bony abnormality.  The myelogram will help with this.  The patient has  been inactive due to his back disease.  I told him that since he has  known disease and is fairly inactive, he would need an adenosine Myoview  to clear him for surgery.   He has not had chest pain.  He has mild chronic dyspnea.  He has no  lower extremity edema.  No palpitations, PND, or orthopnea.   PAST MEDICAL HISTORY:  Remarkable for  1. Hypertension.  2. Prostatism.  3. Coronary artery disease.  4. Reflux.  5. Chronic back problems.  6. Hypercholesterolemia.   ALLERGIES:  PENICILLIN.   PAST SURGICAL HISTORY:  He has had previous  1. Eye surgery for lazy eye.  2. Bunion surgery.  3. L-spine surgery,   MEDICATIONS:  1. Gabapentin 300 a day.  2. Fexofenadine 180 mg.  3. Simvastatin 80 a day.  4. Quinapril 10 a day.  5.  Lasix 40 a day.  6. Plavix 75, on hold.  7. Doxazosin 4 a day.  8. Omeprazole 20 a day.  9. Multivitamins.  10.Coated aspirin.  11.Amitriptyline 10 mg 1-2 tablets at bedtime.  12.Fentanyl patch changes every 2 days.  13.Endocet 10/325 one every 4 hours.  14.Diazepam 5 mg one every 6-8 hours.  15.He is also on Cipro which was started for 14 days February 18, 2008,      for a question of a UTI, I believe.   FAMILY HISTORY:  Unremarkable.   SOCIAL HISTORY:  He is previous smoker, quitting about 10 years ago.  He  does not drink.  He is happily married, his wife was with him today.  The patient does not work.  He is on disability from his back.  He has  limited hobbies.   PHYSICAL EXAMINATION:  VITAL SIGNS:  Remarkable for weight of 245, blood  pressure 120/80, pulse 68, respiratory rate 14.  GENERAL APPEARANCE:  He is in some pain from his back.  HEENT:  Unremarkable.  NECK:  Carotids are without bruit.  No lymphadenopathy, thyromegaly or  JVP elevation.  LUNGS:  Clear with good diaphragmatic motion.  No wheezing.  CARDIAC:  S1-S2 with a soft systolic murmur.  ABDOMEN:  Benign.  Bowel sounds positive, no AAA, no tenderness.  No  bruit.  No hepatosplenomegaly or hepatojugular reflux.  EXTREMITIES:  Distal pulses are intact.  No edema.  NEURO:  Nonfocal.  SKIN:  Warm and dry.  MUSCULOSKELETAL:  No muscular weakness.   His EKG is normal.   IMPRESSION:  1. Coronary disease, previous stent to the left anterior descending      and obtuse marginal with a collateralized distal right.  Follow-up      adenosine Myoview to clear him for surgery.  2. Hypertension, currently stable.  Continue current dose of quinapril      and low-salt diet.  3. Hyperlipidemia in the setting of coronary disease.  Continue      simvastatin 80 a day.  Lipid and liver profile in 6 months.  4. Chronic pain from back problem.  Continue fentanyl patch as well as      Valium-like compounds.  Hopefully we can  clear him  for surgery      quickly.  5. In regards to his anticoagulation, his Plavix has already been      held, his stents are old and we do not need to worry about this      issue, however, he does probably need to be on long-term Plavix      which can be restarted after the operation.  In light of this, I      would stop his omeprazole and switch him over to Protonix.  6. So long as his adenosine Myoview is not high-risk, he will be      cleared for surgery.  7. Urinary tract infection.  Continue ciprofloxacin 500 mg, follow-up      with primary care MD.     Noralyn Pick. Eden Emms, MD, [facility]  Electronically Signed    PCN/MedQ  DD: 02/20/2008  DT: 02/20/2008  Job #: [id number]   cc:   Hilda Lias, M.D.

## 2011-03-15 NOTE — Discharge Summary (Signed)
[name], [name]                 ACCOUNT NO.:  [id number]   MEDICAL RECORD NO.:  [id number]          PATIENT TYPE:  INP   LOCATION:  3040                         FACILITY:  [facility]   PHYSICIAN:  Hilda Lias, M.D.   DATE OF BIRTH:  Jul 27, 1951   DATE OF ADMISSION:  06/19/2009  DATE OF DISCHARGE:  06/29/2009                               DISCHARGE SUMMARY   ADMISSION DIAGNOSIS:  L2-L3 degenerative disk disease with a chronic  back pain, status post fusion.   FINAL DIAGNOSES:  L2-L3 degenerative disk disease with a chronic back  pain, status post fusion and urinary retention.   CLINICAL HISTORY:  The patient was admitted to Surgery because of back  pain with radiation to both the legs.  Previously, he has had fusion at  L4-5 and L5-S1.  The patient has failed conservative treatment.  He has  been seen by Dr. Vear Clock in the Pain Clinic.  X-ray shows stenosis at  L2-3.  Surgery was advised.   LABORATORY DATA:  Normal.   COURSE IN THE HOSPITAL:  The patient was taken to surgery and L2-L3  laminectomy and lysis of adhesion was done.  The surgeon and the patient  did well for the first 24 hours, but later on he complained of lot of  back pain and muscle spasm.  He developed off and on urinary retention.  He was seen by urologist.  Catheter was inserted.  Today, he is feeling  much better.  He is ready to go home.  The wounds were healing.   CONDITION ON DISCHARGE:  Stable.   MEDICATIONS:  Percocet and diazepam.   DIET:  Regular.   ACTIVITY:  Not to drive for at least 4 weeks.   FOLLOWUP:  He will be seen by me in 4 weeks and by the urologist in the  next 3 weeks.           ______________________________  Hilda Lias, M.D.     EB/MEDQ  D:  06/29/2009  T:  06/29/2009  Job:  [id number]

## 2011-03-15 NOTE — Discharge Summary (Signed)
[name], [name]                 ACCOUNT NO.:  [id number]   MEDICAL RECORD NO.:  [id number]          PATIENT TYPE:  INP   LOCATION:  A338                          FACILITY:  [facility]   PHYSICIAN:  Vickki Hearing, M.D.DATE OF BIRTH:  1951-02-27   DATE OF ADMISSION:  08/05/2008  DATE OF DISCHARGE:  10/12/2009LH                               DISCHARGE SUMMARY   ADMITTING DIAGNOSIS:  Osteoarthritis of the left knee.   DISCHARGE DIAGNOSES:  1. Osteoarthritis of the left knee.  2. Postoperative hematoma.  3. Acute blood loss anemia.   PROCEDURE PERFORMED:  Left total knee arthroplasty.   CONSULTANTS:  Consults were obtained through his medical doctor, Dr.  Simone Curia.   HISTORY:  This is a 60 year old male who has had neck surgery, 4 lumbar  surgeries, stent placement in his heart with a history of hypertension,  heart disease, and chronic pain who had end-stage osteoarthritis of the  left knee.   He was treated with multiple aspirations, injections, and arthroscopy of  the left knee.  He continued to have pain, feeling like his leg was  going to go in and out of joint, and popping sensations were noted.  He  had recurrent effusions, which were problematic as well as the concern  regarding his perioperative course was that he was on fentanyl patches,  Lidoderm patches, oxycodone 7.5 mg q.6 h. p.r.n., and we were concerned  about controlling his pain in the perioperative period.  He accepted his  risks and benefits of surgery and possibilities of severe pain after  surgery and agreed to participate in rehab, and he was admitted for  surgery on the stated date above.   HOSPITAL COURSE:  His hospital course was remarkable for uncomplicated  surgery on August 05, 2008, with a Katrinka Blazing and Nephew left total knee  arthroplasty.  His operative findings were consistent with his  preoperative diagnosis.  Implant sizes were 8 femur, 7 tibia, 35  patella, and High-Flex 9-mm posterior  stabilized polyethylene insert.  There was a PCL sacrificing substituting knee.   In the perioperative period, he developed a large hematoma secondary to  elevated INR.  This was treated with observation and stopping his  Coumadin.  He also had some severe pain as we expecting, this was  treated by increasing his PCA with a customized dose, and he is on oral  narcotics as required.   Despite this, he was very diligent about exercising, staying in his CPM,  and walking.   Once his hematoma settled down and his INR corrected, he was discharged  to home.  He was to resume his preoperative medications, which will be  listed below.  His postoperative instructions included a followup visit  with me, scheduled for August 19, 2008.   We put Covaderm on his blisters, Xeroform underneath, and wrapped Kerlix  around his knee with ACE bandage.  He had home health set up for  therapy.  He was discharged on the following medicines:  1. Plavix 75 mg a day.  2. Aspirin 325 mg a day.  3. Quinapril  10 mg twice a day.  4. Doxazosin 4 mg daily,  5. Furosemide 40 mg daily.  6. Simvastatin 80 mg daily.  7. Fexofenadine 180 mg daily.  8. Gabapentin 300 mg 3 times a day.  9. Omeprazole 20 mg twice daily.  10.Nadolol 40 mg daily.  11.Niaspan 500 mg daily.  12.Fentanyl patch 50 mcg every second day as a patch.  13.Percocet 10 mg q.4 h. p.r.n.  14.Colace 100 mg p.o. b.i.d.  15.Milk of magnesia 30 mL p.o. daily for constipation.   His overall condition at discharge was improved.  He was discharged to  home.  Discharge laboratory studies showed that his hemoglobin which  initially was 11.2 and was 9.2, and he did require transfusions while in  the hospital.   His INR was 1.8 with a high of 2.8.   Dictation ended at this point.      Vickki Hearing, M.D.  Electronically Signed     SEH/MEDQ  D:  09/02/2008  T:  09/03/2008  Job:  [id number]

## 2011-03-15 NOTE — Op Note (Signed)
NAMEDIDIER, BRANDENBURG                 ACCOUNT NO.:  [id number]   MEDICAL RECORD NO.:  [id number]          PATIENT TYPE:  INP   LOCATION:  3034                         FACILITY:  [facility]   PHYSICIAN:  Hilda Lias, M.D.   DATE OF BIRTH:  1950/12/25   DATE OF PROCEDURE:  DATE OF DISCHARGE:                               OPERATIVE REPORT   PREOPERATIVE DIAGNOSIS:  L3-L4 stenosis, status post L4-L5 and L5-S1  fusion.   POSTOPERATIVE DIAGNOSES:  L3-L4 stenosis, status post L4-L5 and L5-S1  fusion.   PROCEDURES:  Bilateral L3 laminectomy, lysis of adhesion, decompression  of the L3 and L4 nerve roots, posterolateral arthrodesis with autograft  and bone morphogenetic protein, microscope.   SURGEON:  Hilda Lias, MD.   CLINICAL HISTORY:  Mr. Desai is a gentleman who in the past underwent  fusion of L4-L5 and L5-S1.  Lately, he had been complaining of back pain  radiating to both legs with weakness which required x-ray, which showed  stenosis at the L3-L4.  There was normal movement between flexion and  extension.  Surgery was advised.  A decision was made to do  decompression leaving the posterior pedicle screws if we found some  abnormality.   PROCEDURE:  The patient was taken to the OR and he was positioned in a  prone manner.  The back was cleaned with DuraPrep.  A midline incision  was made and we were able to retract the muscle laterally until we saw  the spinous process of L3 and the operation of the pedicle screws of L4.  X-rays showed indeed we were in that area.  From thereon, with the  [place], we removed the spinous process of L3 with the drill and we  removed the lamina bilaterally.  Preservation of the facet was  accomplished, indeed we found then there was a quite a bit adhesion to  the point, there was no plane between the dura mater and the epidural  space.  The patient had quite a bit of scar tissue.  Lysis was  accomplished.  At the end, we had a good decompression of  the thecal  sac.  The L3 and L4 nerve roots on the right side were decompressed  after lysis, they were on the left side, took a little bit longer.  The  patient had quite a bit of adhesion in that area.  At the end, we had a  good decompression and there was no evidence of any ruptured disk.  Then, with the drill, we drilled the lateral facet of L3 and L4 as well  as the bilateral.  Then, a mix of BMP and autograft was used for  arthrodesis.  From thereon, the wound was closed with Vicryl and Steri-  Strips.           ______________________________  Hilda Lias, M.D.     EB/MEDQ  D:  03/05/2008  T:  03/06/2008  Job:  [id number]

## 2011-03-15 NOTE — H&P (Signed)
[name], [name]                 ACCOUNT NO.:  [id number]   MEDICAL RECORD NO.:  [id number]          PATIENT TYPE:  INP   LOCATION:  3040                         FACILITY:  [facility]   PHYSICIAN:  Hilda Lias, M.D.   DATE OF BIRTH:  Jun 03, 1951   DATE OF ADMISSION:  06/19/2009  DATE OF DISCHARGE:                              HISTORY & PHYSICAL   HISTORY OF PRESENT ILLNESS:  [name] is a gentleman who in the past  underwent decompression laminectomy from 3-4, 4-5.  The patient has been  following in my office.  He has minimal discomfort, and off and on he  has been having some radiculopathy, which had been taken care by the  [facility].  Lately, he had difficulty walking.  The pain is quite  different, it is quite severe, and compromise mostly the proximal legs.  He had conservative treatment.  He finally in view of no improvement had  a myelogram, which showed severe stenosis at the level 2-3.  The rest  looks normal.   PAST MEDICAL HISTORY:  Lumbar fusion at the levels 4-5 and 5-1.  Also,  he has fusion at the level 3-4.   FAMILY HISTORY:  Unremarkable.   SOCIAL HISTORY:  Unremarkable.   PHYSICAL EXAMINATION:  GENERAL:  The patient came to my office with his  wife.  He had difficulty walking and standing.  HEAD, NOSE, AND THROAT:  Normal.  NECK:  Normal.  LUNGS:  Clear.  HEART:  Heart sounds normal.  ABDOMEN:  Normal.  EXTREMITIES:  Normal pulses.  NEURO:  He has some weakness of the quadratus.  He has decreased  __________ quadratus reflex.  Femoral stretch maneuver is positive  bilaterally.   The x-ray showed that the area between 3-4, 4-5, 5-1 is completely  fused.  He had severe stenosis at the level 2-3.   CLINICAL IMPRESSION:  Lumbar stenosis, L2-3.   RECOMMENDATIONS:  The patient is being admitted for surgery.  The  procedure would be bilateral L2-3 laminectomy with foraminotomy with  posterolateral arthrodesis with BMP.  The patient knows about the risks  of  infection, CSF leak, worsening pain, paralysis, and need for further  surgery.           ______________________________  Hilda Lias, M.D.     EB/MEDQ  D:  06/19/2009  T:  06/19/2009  Job:  [id number]

## 2011-03-15 NOTE — Op Note (Signed)
NAMEMUJTABA, BOLLIG                 ACCOUNT NO.:  [id number]   MEDICAL RECORD NO.:  [id number]          PATIENT TYPE:  INP   LOCATION:  A341                          FACILITY:  [facility]   PHYSICIAN:  Vickki Hearing, M.D.DATE OF BIRTH:  05-13-1951   DATE OF PROCEDURE:  08/05/2008  DATE OF DISCHARGE:                               OPERATIVE REPORT   HISTORY:  A 60 year old male with a long-standing pain in his left knee  status post left knee arthroscopy.  At that time, we found severe medial  compartment arthrosis, we treated him with multiple aspirations and  injections for multiple effusions, and he has a failed non-operative  conservative treatment and presents for left total knee arthroplasty.   PREOPERATIVE DIAGNOSIS:  Osteoarthritis of left knee.   POSTOPERATIVE DIAGNOSIS:  Osteoarthritis of left knee.   PROCEDURE:  Left total knee arthroplasty with a Smith & Nephew total  knee replacement system using a size 8 femur, size 7 tibia, 35 patella,  and a High Flex 9-mm posterior stabilized polyethylene insert.   SURGEON:  Vickki Hearing, MD.   ASSISTANTS:  I was assisted by [facility] [place] [facility] and [facility].   ANESTHETIC:  General.   OPERATIVE FINDINGS:  Sclerotic bone changes and complete loss of  cartilage from the medial compartment, both tibial and femoral.  There  was varus deformity with a 5-degree flexion contracture, and there was a  large lateral patellar ridge/osteophyte.   The patient was identified as [name].  His left knee, he marked as  a surgical site and then it was countersigned.  His history and physical  was updated.  He is allergic to PENICILLIN and was started on  vancomycin.  He was taken to surgery, had a general anesthetic by  intubation, tolerated that well, and he then had a Foley catheter  inserted sterilely with no complications.  He does have hypospadias   The left knee was then prepped with DuraPrep and draped sterilely with a  tourniquet applied to the proximal thigh.   After completing the time-out, the knee was exsanguinated with a 6-inch  Esmarch and flexed.  Tourniquet was inflated to 300 mmHg   A straight midline incision was made in flexion.  The medial arthrotomy  was performed.  The knee was opened.  The fat pad behind the patella was  resected.  The medial and lateral menisci were resected.  The ACL and  PCL were resected, and the medial soft tissue sleeve was elevated to the  mid-coronal plane.  The tibia was then subluxated forward.   A drill bit was inserted into the intramedullary canal of the femur,  which was then decompressed with suction and irrigation.  We pinned a 5-  degree left cutting block, set for 11-mm resection to the distal femur,  and made the distal femoral cut.   We then placed the tibial external alignment guide and undercut the  medial defect x 2 mm.  We set the guide for neutral cut in the AP and  lateral planes.   After making the proximal tibial cut, we  measured it to a size 7 and  then checked the extension gap.  The extension gap was excellent with  good extension with a 9 mm spacer block.   We then measured the femur to a size 8.  We externally rotated the block  and then pinned for a size 8 femoral prosthesis.  After making the 4  cuts, we took an osteotome and cleaned out the posterior condyles, both  medially and laterally and cleaned out the soft tissue including the  residual medial and lateral menisci.  We then placed the trial, centered  it, placed the box cut device, reamed twice proximal and distal, and  then punched twice proximal and distal.  We then placed the 9-mm trial  on the 7 base plate and did a trial reduction.  The patella tracked a  little bit laterally and a lateral release was performed until the  patella could track with no-touch technique.   We then resected the patella from a 25-1/2 down to a 14.  We put a 35-mm  button on with lateralizing  and to improve patellar tracking.  We  drilled the 3 holes.   We then ranged the knee, set the tibial rotation, then removed the trial  components, and punched the tibia.   We irrigated the knee, removed debris, and cemented the implants in  place.  During the trial range of motion, we made further soft tissue  releases to balance the knee.   After cementing the components in place and removing excess cement, we  placed the 9-mm High Flex polyethylene insert and took the knee through  a range of motion.  The range of motion was excellent.  Full extension  was obtained, 125 degrees of flexion was obtained at 90 degrees.  AP  stability was excellent in extension.  Medial lateral stability was  normal.  Again, we did a lateral release to improve patellar tracking.   We injected the knee with 30 mL of Marcaine with epinephrine, closed the  extensor mechanism in flexion with #1 Bralon in interrupted and running  sutures.   We inserted a pain pump catheter in the subcu and then closed with O  Monocryl and 0 Monocryl suture.   We did irrigate the knee prior to closure.  We removed any excess cement  and debris.   We closed the skin with staples.  We put on sterile dressings and a  Cryo/Cuff.   The patient was extubated and taken to recovery room in stable  condition.   We would like to use Coumadin for DVT prevention along with foot pumps  and TED hose, and early mobilization.   He will be on vancomycin 1 dose to follow the SCIP protocols.   He is full weightbearing and he will be on a CPM machine.      Vickki Hearing, M.D.  Electronically Signed     SEH/MEDQ  D:  08/05/2008  T:  08/05/2008  Job:  [id number]

## 2011-03-18 NOTE — H&P (Signed)
NAMECHANCY, SMIGIEL                           ACCOUNT NO.:  [id number]   MEDICAL RECORD NO.:  [id number]                   PATIENT TYPE:  INP   LOCATION:  2899                                 FACILITY:  [facility]   PHYSICIAN:  Hilda Lias, M.D.                DATE OF BIRTH:  05-14-1951   DATE OF ADMISSION:  10/07/2003  DATE OF DISCHARGE:                                HISTORY & PHYSICAL   HISTORY OF PRESENT ILLNESS:  The patient is a gentleman who underwent L3-4  and L4-5 decompressive surgery in the past.  Nevertheless, I have been  following him for several months.  He has been unable to walk.  The pain  that is complaining of is mostly going to both legs, the left worse than  right one.  He had developed weakness of the dorsiflexion of the left foot  with weakness of the quadriceps.  The patient had conservative treatment,  including epidural injection, without any improvement.  Because of that, we  went ahead and we did a myelogram followed by a CT scan, and because of the  findings the patient wanted to proceed with surgery.   PAST MEDICAL HISTORY:  1. Decompressive laminectomy at the level of L3-4 and L4-5 in the left side.  2. He had L3-4 diskectomy many years ago because of a foot drop.  3. Anterior cervical diskectomy about two years ago.   FAMILY HISTORY:  Unremarkable.   REVIEW OF SYSTEMS:  Positive for back and bilateral leg pain, left worse  than the right one.   SOCIAL HISTORY:  Negative.   PHYSICAL EXAMINATION:  GENERAL:  The patient came to my office with his wife  and he was limping from the left leg.  HEENT:  Normal.  NECK:  Normal.  LUNGS:  Clear.  HEART:  Heart sounds normal.  EXTREMITIES:  Normal extremities, normal pulse.  NEUROLOGIC:  Mental status normal.  Cranial nerves normal.  Strength is 5/5,  except that he has weakness on dorsiflexion of the left foot.  He has some  mild weakness of the left quadriceps.  The ________ maneuver was positive  bilaterally, and straight leg raising is positive at 80 degrees.   LABORATORY DATA:  The myelogram and MRI show that he has a severe case of  degenerative disk disease at the level of 4-5 and 5-1, and borderline at 3-  4.   CLINICAL IMPRESSION:  Chronic radiculopathy secondary to degenerative disk  disease, 4-5 and 5-1, and borderline 3-4.   RECOMMENDATIONS:  The patient is being admitted for surgery.  The procedure  will be bilateral 4-5 and 5-1 diskectomy and interbody fusion.  We are going  to do a compressive laminectomy at the level of 3.  A decision  about what to do with the L3-4 disk will be made during surgery.  He and his  wife know about the risks such  as infection, CSF leak, worsening pain, need  for further surgery, damage to the vessels of the abdomen, failure of the  graft, failure of the hardware.                                                Hilda Lias, M.D.    EB/MEDQ  D:  10/07/2003  T:  10/07/2003  Job:  [id number]

## 2011-03-18 NOTE — Cardiovascular Report (Signed)
[place]  Patient:    KIRKE, BREACH Visit Number: [id number] MRN: [id number]          Service Type: MED Location: 2000 2041 01 Attending Physician:  Rollene Rotunda Dictated by:   Lewayne Bunting, M.D. [facility] Proc. Date: 07/25/01 Admit Date:  07/24/2001   CC:         Lilyan Punt, M.D.  Daisey Must, M.D. [facility]   Cardiac Catheterization  DATE OF BIRTH:  12/20/1950  REFERRING PHYSICIAN:  Lilyan Punt, M.D.  Phone number [phone].  CARDIOLOGIST:  Daisey Must, M.D. [facility]  PROCEDURES:  Left heart catheterization, selective coronary angiography and ventriculography.  DIAGNOSES: 1. End stent restenosis of first obtuse marginal branch. 2. High grade mid left anterior descending artery stenosis. 3. Chronically occluded distal right coronary artery. 4. Mild left ventricular systolic dysfunction with inferior hypokinesis.  HISTORY OF PRESENT ILLNESS:  Mr. Stranahan is a 60 year old male with multiple cardiovascular risk factors, including hypertension, hypercholesterolemia, previous tobacco use, as well as a strong family history of coronary artery disease.  The patient approximately two years ago underwent cardiac catheterization by Dr. Gerri Spore, and was found to have significant coronary artery disease requiring stent placement to a large first obtuse marginal branch.  At that time, an attempt was made to try to cross an occluded distal right coronary artery which was not successful.  During this time, the patient has been doing well.  He has had no recurrent chest pain.  He is now admitted with recurrent substernal discomfort, shortness of breath, and diaphoresis. He has ruled out for myocardial infarction.  The decision was made to proceed with cardiac catheterization to reassess his coronary anatomy and rule out end stent restenosis.  DESCRIPTION OF PROCEDURE:  After informed consent was obtained, the patient was brought to the  catheterization laboratory.  The right groin was sterilely prepped and draped and infiltrated with 1% lidocaine.  A 6 French arterial sheath was placed using the modified Seldinger technique.  Subsequently, 6 [place] geled catheters were used to engage the left and right coronary system, respectively.  Angiographic images were then obtained in various projections, using manual injection of contrast.  After coronary angiography, a pigtail catheter was placed in the left ventricular cavity, and the left-sided hemodynamics were obtained.  ______ injection was obtained to perform left ventriculography.  At the termination of the case, all catheters were removed. The sheath was left in place in anticipation of a percutaneous coronary intervention to the left anterior descending artery and/or the stent to the obtuse marginal branch.  CATHETERIZATION RESULTS: 1. Hemodynamics:  Aortic pressure 140/80.  Left ventricular pressure 140/22.    There was no aortic valve gradient. 2. Left ventriculogram:  Mild left ventricular systolic dysfunction with    hypokinesis of the basal and mid inferior wall. 3. Selective coronary arteriography:  (Codominant system).  One left main    coronary artery.  There is no evidence of ______ coronary artery disease.    Left anterior descending artery is a moderate sized caliber vessel with a    proximal 30% stenosis.  The mid vessel has an 80% focal stenosis.  The    remainder of the distal vessel has nonobstructive coronary artery disease    with respect to 40% tandem lesions in the distal vessel.  Left circumflex    coronary artery gives rise to a small ramus intermedius.  The first obtuse    marginal branch is a large caliber vessel bifurcating  in a superior    inferior limb.  The superior limb is free of coronary artery disease, but    the inferior limb has a proximal 80% stenosis just prior to the stent, and    followed then by an 80% end stent restenosis of the  proximal 1/3 of the NIR    Royal stent.  In addition, the proximal circumflex has a diffuse 30%    stenosis.  Right coronary artery is a small codominant vessel which is    diffusely diseased.  There is approximately a 50% stenosis in the mid    vessel, followed by a long 60 to 70% diffuse stenosis which terminates in    an occluded distal right coronary artery.  There is vein filling of a small    posterior descending coronary artery and posterolateral branch from    right-to-right collaterals (bridging collaterals), as well as left-to-right    collaterals filling a posterolateral branch.  There is a moderate sized    right ventricular branch which has tandem lesions of approximately 80 and    90% in the mid vessel.  CONCLUSIONS: 1. End stent restenosis of the first obtuse marginal branch. 2. High grade mid left anterior descending artery lesion. 3. Occluded distal right coronary artery with bridging collaterals and    left-to-right collaterals.  RECOMMENDATIONS:  The results have been discussed with the patient as well as Dr. Gerri Spore.  The plan is to proceed with percutaneous coronary intervention to relieve the end stent restenosis and left anterior descending artery stenosis. Dictated by:   Lewayne Bunting, M.D. [facility] Attending Physician:  Rollene Rotunda DD:  07/25/01 TD:  07/25/01 Job: [id number]

## 2011-03-18 NOTE — Consult Note (Signed)
[place]. [facility]  Patient:    [name], [name]                        MRN: [id number] Proc. Date: 01/06/01 Adm. Date:  [id number] Attending:  Danella Penton CC:         Tanya Nones. Jeral Fruit, M.D.   Consultation Report  BRIEF HISTORY:  This patient is seen in hospital consultation for further evaluation of inability to void after his back surgery.  He is a 60 year old white male with a history of obstructive symptoms that he tells me have worsened somewhat over the past couple of years.  He has had back pain since that time and occasionally the back pain will radiating around into the penis and groin.  He has noted over the years diminished pressure and force of his urinary stream as well as intermittency, nocturia 3 times as well as hesitancy.  He has never had a UTI or stones or gross hematuria.  He has had difficulty with voiding in the past with prior hospitalizations, and he says his "nerves" seem to make it worse as well.  He is now unable to void after his back surgery and notes that in the past he was able to feel his very full and distended bladder but recently has not been able to sense a full bladder despite there being greater than 1 liter in the bladder.  OTHER PERTINENT UROLOGIC HISTORY:  Consists of a prior PSA done by Dr. Gerda Diss about 1 year ago.  The patient says it was slightly elevated.  He does not know the result though.  He also notes that he has had a hypospadias but it does not cause any penile curvature, and he voids with a good stream otherwise without splitting or spreading.  He has had an attempt at prior repair in the past.  He has a past history of hypertension, cigarette smoking and a prior MI with coronary artery disease.  ALLERGIES:  PENICILLIN caused him to pass out in the past.  MEDICATIONS:  On admission his medications are:  1. Corgard 40 mg.  2. Lipitor 40 mg.  3. Accupril 10 mg.  4. Humibid long-acting  b.i.d.  5. allegra 60 mg b.i.d.  6. Nitroglycerin patch.  7. Aspirin.  8. Percocet.  9. Nitrostat. 10. Prilosec 11. Multivitamins.  SOCIAL HISTORY:  He denies alcohol or tobacco use.  FAMILY HISTORY:  Negative for GU malignancy or renal disease.  REVIEW OF SYSTEMS:  As per his Health History Sheet Section II which was reviewed and signed.  LABORATORY DATA:  Serum creatinine is 1.0.  He has a UA CNS that is currently pending.  PHYSICAL EXAMINATION:  GENERAL:  The patient is a well-developed, well-nourished white male in no apparent distress.  VITAL SIGNS:  Temperature 97.7, pulse 50, respiration 20, blood pressure 153/87.  SKIN:  Warm and dry.  HEENT:  Atraumatic, normocephalic.  PERRLA, EOMI, oropharynx is clear.  CHEST:  Reveals normal respiratory effort.  CARDIOVASCULAR:  Bradycardic and regular.  ABDOMEN:  Soft, nontender, nondistended without mass or hepatosplenomegaly. He has no inguinal hernias or adenopathy.  GENITOURINARY:  He has an uncircumcised male phallus with a hypospadiac meatus that is at the coronal to subcoronal location and evidence of attempt at prior surgical repair.  No lesions are noted.  There is no discharge and the meatus is adequate.  His scrotum, testicles and epididymis, anus and perineum are normal.  He  has normal sphincter tone and a piloted perianal wink.  He has no rectal masses.  His prostate is just slightly 1 enlarged but is free of any suspicious nodularity or induration.  There is obliteration of the median furrow.  The seminal vesicles have no palpable abnormality.  IMPRESSION:  1. Acute urinary retention in a patient who has just had recent back surgery.     The patient has fairly moderate obstructive symptoms as a baseline     consistent with some BPH which obviously had been worsened by the pain of     his surgery and narcotics and decreased mobility.  I am going to try to     rather diminish outlet resistance with an alpha  blocker and also try to     increase detrusor contractility with a cholinergic agent.  2. He has mild BPH by exam but a smooth prostate.  3. He reports an elevated PSA in the past, over 1 year ago, that will need     followup as an outpatient once his urinary retention has resolved.  4. Bladder outlet obstructive symptoms of longstanding duration.  He has     some slight prostate enlargement.  There may be a neurogenic component     as well but I think the first thing that he needs is a trial of     anticholinergics before further investigation such as urodynamics are     considered.  5. He has mild hypospadias with no chordae.  I would not recommend any     surgical therapy for this.  PLAN:  1. Flomax 0.8 mg q. day.  2. Urecholine 25 mg q.i.d.  3. Continue I&O catheterization q.6h. and check PVRs until less than 100 cc.  4. UA CNS has been sent.  5. Will follow and assist in care. DD:  01/06/01 TD:  01/08/01 Job: [id number]

## 2011-03-18 NOTE — H&P (Signed)
NAMEMADSEN, RIDDLE                             ACCOUNT NO.:  [id number]   MEDICAL RECORD NO.:  [id number]                   PATIENT TYPE:   LOCATION:                                       FACILITY:   PHYSICIAN:  Hilda Lias, M.D.                DATE OF BIRTH:  06/24/1951   DATE OF ADMISSION:  11/27/2002  DATE OF DISCHARGE:                                HISTORY & PHYSICAL   CLINICAL HISTORY:  The patient is a gentleman who in the past underwent L3-4  diskectomy because of foot drop.  Later on, he was admitted because of  spondylosis and foraminotomy of that level was accomplished.  The patient  did well, but he came to see me in my office because weakness of the left  quadriceps associated with atrophy and weak dorsiflexion of the left foot.  This problem is on the left side, which before, his problems had been on the  right side.  Although he is concerned about the right leg,  nevertheless, he  is really worried about the amount of  weakness that he is having in the  left thigh.  Because of that, we proceed with an MRI as well as  electromyelogram and because of the findings, he is to be admitted for  surgery.   PAST MEDICAL HISTORY:  Diskectomy at L3-4 and also foraminotomy for  spondylosis of the L3-4 and L4-5.  This was one done last time in 12/2000.   FAMILY HISTORY:  Remarkable.   REVIEW OF SYSTEMS:  Positive for back pain, weakness.  He has had cervical  diskectomy. That one was four years ago by me.   SOCIAL HISTORY:  Negative.   PHYSICAL EXAMINATION:  GENERAL:  The patient came to my office with his  wife.  He was obviously limping from the left leg.  HEENT:  Normal.  NECK:  There is a scar on the left side from previous surgery.  He is able  to flex __________ with minimal discomfort.  LUNGS:  Clear.  HEART:  Normal.  EXTREMITIES:  Normal pulses.  NEURO:  Mental status normal.  Cranial nerves normal.  Strength:  Showed  __________ atrophy of the left  quadriceps with weakness of the left  quadriceps iliopsoas, mild weakness of the left dorsiflexor in the left foot  .  In the right leg, there is no evidence of any weakness.  Sensation:  He  complains of numbness, mostly in the left thigh and some of it in the right  foot.  Reflexes symmetrical with absence of the left knee jerk.  Coordination:  Normal gait.  He is obviously limping on the left leg and  when he tried to stand from sitting position he is using mostly the right  thigh.   The myelogram showed this gentleman had a herniated disk at the level of 4-5  with intra and extraforaminal  component. The EMG showed that indeed there is  compromise of the L4 level.   CLINICAL IMPRESSION:  Left  L4-5 herniated disk with intra and  extraforaminal component associated with atrophy of the left quadriceps most  likely secondary to compromise of the L4 nerve root proven by MRI,  myelogram, as well as EMG.   RECOMMENDATIONS:  The patient is being admitted for surgery and the  procedure will be a left 4-5 diskectomy with intra and extraforaminal  approach.  We are going to try the  Medrix system, but if that fails we are  going to proceed with our standard diskectomy.  He and his wife know all the  risks of infection, CSF leak, worsening pain, paralysis and no improvement  whatsoever of the weakness in the quadriceps.                                               Hilda Lias, M.D.    EB/MEDQ  D:  11/27/2002  T:  11/27/2002  Job:  [id number]

## 2011-03-18 NOTE — Op Note (Signed)
[name], [name]                           ACCOUNT NO.:  [id number]   MEDICAL RECORD NO.:  [id number]                   PATIENT TYPE:  INP   LOCATION:  2852                                 FACILITY:  [facility]   PHYSICIAN:  Hilda Lias, M.D.                DATE OF BIRTH:  1951-02-14   DATE OF PROCEDURE:  11/27/2002  DATE OF DISCHARGE:                                 OPERATIVE REPORT   PREOPERATIVE DIAGNOSIS:  Left L4-L5 intra and extraforaminal herniated disk  with compromise of the L4 nerve root by electromyelogram and magnetic  resonance imaging.   POSTOPERATIVE DIAGNOSIS:  Left L4-L5 intra and extraforaminal herniated disk  with compromise of the L4 nerve root by electromyelogram and magnetic  resonance imaging.   PROCEDURE:  Left L4-L5 intra and extraforaminal diskectomy, lysis of  adhesions, decompression of the L4 nerve root.  Microscope:  Metrix system.  C-arm.   SURGEON:  Hilda Lias, M.D.   INDICATIONS:  The patient is a gentleman who in the past underwent a 3-4  diskectomy and later on a 3-4, L4-5 foraminotomy. Now he came to see [place] with  __________ of the left quadriceps associated with pain. Myelogram showed a  herniated disk, intra and extraforaminal at the level of 4-5, compromising  the L4 nerve root. The electromyogram showed compromise of the L4 nerve  root. Surgery was advised. He and his wife knew about the possibility of no  improvement whatsoever, need for further surgery, infection, CSF leak and  worsening of the pain.   PROCEDURE:  The patient was taken to the operating room and he was  positioned under a monitor  video. The back was prepped with Betadine.  Drapes were applied. With the C-arm we localized the L4-5 space.   Then we made an incision about an inch away from the midline. The incision  was carried down and the dilator was inserted until we were able to localize  the area of transverse process of 4-5 on the left side with the facet of  the  4-5. From then on, we brought the microscope into the area.   The patient had quite a bit of scar tissue and lysis was accomplished. At  the end we found out that the L4 nerve root was swollen and edematous. Also  it was reddish. A __________ was made and we found the disk space.   An incision was made and quite a bit of degenerative disk was removed. At  the end we had good decompression of the L4 nerve root. There was no  evidence of more scar tissue or any disk compromising the L4.   We went into the intraforaminal space into the previous laminotomy and we  decompressed the disk from the inside out. There was minimal disk at this  level. Investigation of above and below was negative. At the end we had  plenty of  room for the L5 and also for the L4 nerve root.   From then on the area was irrigated. Fentanyl was left in the epidural space  and the wound was closed with Vicryl and Steri-Strips.                                              Hilda Lias, M.D.   EB/MEDQ  D:  11/27/2002  T:  11/27/2002  Job:  [id number]

## 2011-03-18 NOTE — H&P (Signed)
[place]. [facility]  Patient:    ESCHER HARR                         MRN: [id number] Adm. Date:  [id number] Attending:  Danella Penton                         History and Physical  HISTORY:  Mr. Collins [place] is a gentleman who had surgery by me more than one year ago t the level of the cervical spine, because of cervical myelopathy with a stenosis  between C5-6 and C6-7.  Last night Dr. Stefani Dama was called by Dr. Gerda Diss. The history was that the patient was having quite a bit of weakness in the left  leg.  He was seen early this afternoon by Dr. Danielle Dess in the office, and he called me. Immediately I went to see him, and indeed Mr. Hort had been having quite a it of weakness of the left foot, especially when he walks, associated with weakness of the quadriceps and the iliopsoas.  It seems that the history started about two weeks ago when he was bending over and developed the sudden onset of pain in his back, with radiation down to the left foot.  Right now he complains of numbness of the left thigh, numbness of the leg, weakness of the leg, especially distally. he denies any pain in the right leg, or any problem with his bladder or bowels. He had an MRI last night.  PAST MEDICAL HISTORY:  Anterior cervical diskectomy.  He had surgery on the ear. Also he has a stent inserted into the heart a few months ago.  FAMILY HISTORY:  Negative.  CLINICAL IMPRESSION:  A patient who is complaining of quite a bit of pain in the left leg.  He is having quite a bit of difficulty sitting.  PHYSICAL EXAMINATION:  HEENT:  Normal.  NECK:  A scar, well-healed.  He has a good ____________ with some mild discomfort on extension.  LUNGS:  Clear.  HEART:  Sounds normal.  ABDOMEN:  Normal.  EXTREMITIES:  Normal pulses.  NEUROLOGIC:  Mental status normal.  Cranial nerves:  Pupils equal, reactive. Face symmetrical.  Funduscopic clear.  Hearing is  normal.  Able to move his shoulder and able to stick his tongue in the midline.  Strength in the upper extremities normal. In the right leg is normal.  In the left leg I found weakness of the iliopsoas,  quadriceps, and also weakness of dorsiflexion of the left foot.  Plantar flexion seems to be normal.  Reflexes symmetrical with absence of the left knee jerk. Straight leg raising negative bilaterally with the femoral stretch maneuver positive on the left, and negative on the right side.  He had an MRI as well as x-ray of the lumbar spine.  The lumbar spine showed four lumbar vertebrae.  Indeed at the level of L3-4 on the left side there is a large herniated disk with expression of the thecal sac, and going to the foramen.  CLINICAL IMPRESSION:  Left L3-4 herniated disk with a foot drop.  RECOMMENDATIONS:  I talked to the patient and to his friend at length.  I am really concerned about the amount of weakness, especially with the foot drop that he is having.  I mentioned to him and to his friend, that this is something that needs to be taken care of immediately,  and I do not think that we need to go ahead with he and continue with the possibility of treatment.  He is being admitted immediately for surgery.  The procedure will be a left L3-4 diskectomy and foraminotomy. The risks of course are CSF leak, need for further surgery, paralysis, and all the problems associated with his heart and also with the previous history of deep vein thrombosis. DD:  09/17/99 TD:  09/17/99 Job: [id number]

## 2011-03-18 NOTE — H&P (Signed)
[place]. [facility]  Patient:    [name], [name]                        MRN: [id number] Adm. Date:  [id number] Attending:  Danella Penton                         History and Physical  HISTORY OF PRESENT ILLNESS:  [name] is a gentleman who, in the past, underwent L3-4 diskectomy because of left foot drop.  The patient did well and off and on has been seen in my office, but lately he is complaining of worsening of the pain in the left leg.  He was scheduled to have a left L3-4, L4-5 foraminotomy about 10 days ago but when he came he was complaining of some pain in the right leg.  Since that was a big difference from before, we decided to cancel the surgery and for him to go home, to monitor himself, and to see if indeed the pain was going to the right leg or mostly was to the left.  Again, today he tells me that since he left the hospital 10 days ago the pain has been persistent and going to the left leg, not to the right leg. Because of that, he is being admitted for surgery.  PAST MEDICAL HISTORY:  L3-4 diskectomy because of foot drop.  FAMILY HISTORY:  Unremarkable.  REVIEW OF SYSTEMS:  The patient complains of back and left leg pain with numbness of the left foot.  He has weakness of the left foot.  He has a history of anterior cervical diskectomy done by me almost 18 months ago.  SOCIAL HISTORY:  Negative.  PHYSICAL EXAMINATION:  GENERAL:  The patient walked into my office and he is complaining of back pain going to the left leg.  HEENT:  Normal.  NECK:  There is an anterior scar.  He has good flexibility.  There are no bruits in the carotids.  LUNGS:  There is some mild rhonchi bilaterally.  HEART:  Heart sounds normal.  ABDOMEN:  Normal.  EXTREMITIES:  Normal pulses.  NEUROLOGIC:  Mental status normal.  Cranial nerves normal.  Strength is 5/5 except in the left leg.  He has weakness on dorsiflexion of the left foot and some  mild weakness over the left quadriceps.  There is no atrophy or fasciculation.  Reflexes are symmetrical with decrease of the left knee jerk. Straight leg raising on the right side is positive to 80.  Left side is positive about 30 degrees.  He had difficulty walking tiptoes and heel, with the left leg especially.  Sensation:  Numbness which affects the L4 and L3 area.  CLINICAL IMPRESSION:  Left L3-4, L4-5 spondylosis with radiculopathy.  RECOMMENDATIONS:  The patient is being admitted for a left 3-4 and 4-5 foraminotomy.  We are going to investigate the disk but, looking at the myelogram, there is no evidence of any ruptured disk, but mostly stenosis. The patient knows about the risks such as infection, CSF leak, worsening of pain, paralysis, and the possibility that in the future he will need more surgery and it would be a fusion. DD:  01/05/01 TD:  01/06/01 Job: [id number]

## 2011-03-18 NOTE — Discharge Summary (Signed)
   [name], [name]                           ACCOUNT NO.:  [id number]   MEDICAL RECORD NO.:  [id number]                   PATIENT TYPE:  INP   LOCATION:  3037                                 FACILITY:  [facility]   PHYSICIAN:  Danae Orleans. Venetia Maxon, M.D.               DATE OF BIRTH:  05-11-1951   DATE OF ADMISSION:  11/27/2002  DATE OF DISCHARGE:  11/30/2002                                 DISCHARGE SUMMARY   REASON FOR ADMISSION:  Lumbar disk herniation, hypertension, old myocardial  infarction, coronary atherosclerosis of the native coronary vessel, and  status post prior percutaneous transluminal coronary angioplasty.   FINAL DIAGNOSES:  Lumbar disk herniation, hypertension, old myocardial  infarction, coronary atherosclerosis of the native coronary vessel, and  status post prior percutaneous transluminal coronary angioplasty.   HOSPITAL COURSE:  The patient is a 60 year old man who is a patient of Dr.  Jeral Fruit. He was admitted on November 27, 2002, for left L4-5 disk herniation  with intra and extraforaminal component.  He was admitted on same-day  hospital procedure basis and underwent microdiskectomy. He did well after  surgery, was observed in the hospital. He was gradually doing better on  January 31, and was discharged home at that point with instructions to  follow up in three weeks with Dr. Jeral Fruit with discharge medications of  Percocet and Valium.                                               Danae Orleans. Venetia Maxon, M.D.    JDS/MEDQ  D:  01/10/2003  T:  01/11/2003  Job:  [id number]

## 2011-03-18 NOTE — H&P (Signed)
[name], [name]                 ACCOUNT NO.:  [id number]   MEDICAL RECORD NO.:  [id number]          PATIENT TYPE:  INP   LOCATION:  A340                          FACILITY:  [facility]   PHYSICIAN:  Scott A. Gerda Diss, MD    DATE OF BIRTH:  1951/10/16   DATE OF ADMISSION:  01/29/2007  DATE OF DISCHARGE:  LH                              HISTORY & PHYSICAL   CHIEF COMPLAINT:  Cough, weakness.   HISTORY OF PRESENT ILLNESS:  This is 60 year old white male who has had  a 4- or 5-day history of a cold with a little bit of a runny nose and a  mild cough and over the weekend, began having a very harsh cough with  some chest discomfort, feeling short-winded and feeling dizzy.  He  states that he just really does not feel like eating anything on drink  anything and was just pretty much out of it when he came to my office  and was not able to really even get up on the exam table.  In addition  to this, he states some nausea, but no vomiting or diarrhea, and he  related having a lot of fever and chills and feeling the worst that he  has ever felt.   PAST MEDICAL HISTORY:  1. Long history of a hypertension.  2. Back problems including cervical and lumbar back troubles and has      had previous surgery plus also is on chronic pain medicines.  3. In addition to this, he has had stents in the past along with      coronary artery disease.  4. BPH.  5. Hyperlipidemia.  6. He also had hospital stays for diverticulosis, cardiac stents,      lumbar surgery, neck surgery.   ALLERGIES:  PENICILLIN; he states to makes him go unconscious; that  occurred after a shot.   FAMILY HISTORY:  Colon cancer, hypertension, heart disease.   SOCIAL HISTORY:  He is married, quit smoking several years back, has  children.   MEDICATION LIST:  1. Zocor 80 mg daily.  2. Cardura 4 mg nightly.  3. Corgard 40 mg one-half daily.  4. Accupril 10 mg b.i.d.  5. Lasix 40 mg q.a.m.  6. Nitrostat p.r.n.  7. Plavix 75 mg  daily.  8. Nexium 40 mg daily.  9. Aspirin 325 mg daily.  10.Percocet averages one to three per day.  11.Levitra 50 mg t.i.d.  12.Duragesic 50-mcg patch applied every 2 days.   REVIEW OF SYSTEMS:  See per above.   PHYSICAL EXAMINATION:  GENERAL:  Looks to feel very ill, extremely weak  in the office, tachycardiac, little bit tachypneic.  HEENT:  TMs NL, __________  CHEST:  Rales in the right lower half of the lung.  HEART:  Tachycardic.  No murmurs.  ABDOMEN:  Soft.  EXTREMITIES:  Warm, temperature noted 100.8.   LABORATORY AND ACCESSORY CLINICAL DATA:  White count 6.8, hemoglobin  11.4, MCV 88.4, platelets 107,000.  Sodium 137, potassium 3.4.  Blood  culture taken.   Chest x-ray:  Right lower lobe pneumonia.  ASSESSMENT/PLAN:  Right lower lobe pneumonia.  We will go with  intravenous antibiotics, both Levaquin and doxycycline, ,to cover for  the possibility of a post-flu methicillin-resistant Staphylococcus  aureus.  The patient was pretty toxic.  I think he needs a couple of  days of intravenous antibiotics, making sure the blood cultures are  negative.  It is concerning for the possibility of sepsis, given his  dehydration as well and his toxic appearance, but if he does the corner  significantly over the next couple of days, then hopefully will be able  to move towards outpatient.      Scott A. Gerda Diss, MD  Electronically Signed     SAL/MEDQ  D:  01/30/2007  T:  01/30/2007  Job:  [id number]

## 2011-03-18 NOTE — Cardiovascular Report (Signed)
[place]  Patient:    NOLEN, LINDAMOOD Visit Number: [id number] MRN: [id number]          Service Type: MED Location: [phone] Attending Physician:  Rollene Rotunda Proc. Date: 07/25/01 Admit Date:  07/24/2001   CC:         Lilyan Punt, M.D.  Cath Lab   Cardiac Catheterization  PROCEDURE: 1. PTCA with stent placement in the mid left anterior descending artery. 2. PTCA utilizing the cutting balloon of the second obtuse marginal both in    the proximal and midvessel.  In the midvessel it was for in-stent    restenosis.  INDICATIONS:  Mr. Liaw is a 60 year old male who had a history of previous stent placement in the first obtuse marginal branch.  He presented to the hospital with unstable angina.  Cardiac catheterization today by Dr. Andee Lineman revealed an 80% stenosis in the mid left anterior descending artery.  In the left circumflex system, there was a large obtuse marginal which had a previously placed stent.  Just at the proximal portion of the stent, there was an 80% focal in-stent restenosis.  In addition, proximal to this area just before the bifurcation of the first marginal was a 70% stenosis.  We opted to proceed with percutaneous intervention.  DESCRIPTION OF PROCEDURE:  The preexisting 6 French sheath in the right femoral artery was exchanged over a wire for a 7 [place] sheath.  Heparin and Integrilin were administered per protocol.  We used a 7 [place] Voda left 4 guiding catheter and a BMW wire.  We initially treated the left anterior descending artery.  The lesion was predilated with a 3.0 x 15 mm Quantum balloon inflated to 10 atmospheres.  We then deployed a 3.0 x 12 mm Express II stent at a deployment pressure of 10 atmospheres.  This stent was postdilated with a 3.0 x 9 mm Quantum balloon inflated to 14 atmospheres at the distal aspect of the stent and 17 atmospheres at the proximal aspect of the stent. Angiographic images  revealed patency of the LAD with 0% residual stenosis and Timi 3 flow.  We then turned our attention to the left circumflex.  The BMW wire was advanced into the circumflex beyond the stenosis in the obtuse marginal branch. We then used a 3.25 x 10 mm cutting balloon.  This was positioned first across the area of in-stent restenosis and inflated to 6, 8, and then 8 atmospheres.  The balloon was then pulled back and positioned in the proximal portion of the obtuse marginal and inflated to 8, 10, and 10 atmospheres. Final angiographic images revealed patency of the obtuse marginal with 25% residual stenosis at both sites that were treated with Timi 3 flow.  COMPLICATIONS:  None.  RESULTS: 1. Successful PTCA with stent placement in the mid LAD reducing an 80%    stenosis to 0% residual with Timi 3 flow. 2. Successful PTCA utilizing the cutting balloon of the first obtuse marginal    branch and 80% in-stent restenosis was reduced to 25% residual.  A 70%    stenosis in the proximal marginal was reduced to 25% residual with Timi 3    flow.  PLAN:  Integrilin will be continued for 18 hours.  Plavix will be administered for a minimum of four weeks. Attending Physician:  Rollene Rotunda DD:  07/25/01 TD:  07/25/01 Job: [id number]

## 2011-03-18 NOTE — Cardiovascular Report (Signed)
[name], [name]                           ACCOUNT NO.:  [id number]   MEDICAL RECORD NO.:  [id number]                   PATIENT TYPE:  OIB   LOCATION:  2902                                 FACILITY:  [facility]   PHYSICIAN:  Veneda Melter, M.D. [facility]               DATE OF BIRTH:  May 29, 1951   DATE OF PROCEDURE:  07/19/2002  DATE OF DISCHARGE:  07/20/2002                              CARDIAC CATHETERIZATION   PROCEDURES PERFORMED:  1. Selective coronary angiography.  2. Percutaneous transluminal coronary angioplasty, rotational arthrectomy,     and stent placement of distal right coronary artery.   DIAGNOSES:  1. Two-vessel coronary artery disease.  2. Exertional angina.   HISTORY:  The patient is a 60 year old gentleman with known history of two-  vessel coronary artery disease with occlusion of the distal right and severe  disease in the large first marginal branch of the left circumflex artery. He  has undergone percutaneous intervention with stent placement to the first  marginal branch of the left circumflex artery with repeat treatment for in-  stent re-stenosis.  Unfortunately, he has continued to have substernal chest  discomfort attributable to his subtotal occlusion of the distal right  coronary artery. Stress imaging study has shown inferior wall scar with a  mild amount of inferolateral wall ischemia.  He has been poorly tolerant of  medical therapy with significant side effects due to nitrates. He presents  now for an attempt of percutaneous intervention of the distal right coronary  artery.   TECHNIQUE:  Informed consent had been obtained, patient brought to the  cardiac catheterization lab, and a 7 French sheath was placed in the right  femoral artery using the modified Seldinger technique. A 6 [place] JL4  diagnostic catheter was used to engage the left coronary artery and  selective angiography performed in various projections using manual  injections of contrast. A  7 [place] JR4 guide catheter was then used to  engage the right coronary artery and selective guide shots of the RCA  performed using manual injections of contrast.   FINDINGS:  Initial findings are as follows:  1. Left main trunk is a medium caliber vessel with mild disease of 20%.  2. LAD:  This is a medium caliber vessel that provides two small diagonal     branches in the mid section.  The LAD has mild diffuse disease of 30-40%     in the proximal mid section.  3. Left circumflex artery:  A large caliber vessel that provides a large     bifurcating first marginal branch in the mid section and a small second     marginal branch distally.  The AV circumflex has mild diffuse disease of     30%.  A large bifurcating first marginal branch has moderate narrowing of     30% prior to bifurcation. There is a previously placed stent in the  inferior division which has mild in-stent re-stenosis of 30%.  4. Right coronary artery is dominant.  This is a medium caliber vessel that     provides the posterior descending artery in its terminal segment.  The     right coronary artery has moderate diffuse disease in the mid and distal     sections of 50-60%. There is then a long area of subtotal occlusion in     the distal section prior to the PDA.   With these findings, we elected to proceed with an attempt at percutaneous  intervention to the distal right coronary artery.  The patient had been  pretreated with aspirin and Plavix and was given heparin to maintain ACT of  greater than 250 seconds.  Later during advance in interventional therapy  including rotablation ACT was maintained at 300 seconds. Next, a support  wire was advanced within a 2.0/15 mm Maverick balloon for support and used  to probe the distal RCA. This proved unsuccessful and additional wires  including a Cross-It 300 as well as a standard wire were used with limited  successful.  A single inflation was performed with the Maverick  balloon in  an attempt to snowplow the distal vessel. However, this proved unsuccessful  as the balloon could not be advanced very far into the distal RCA due to  poor guide support. An attempt was made using a 1.5 mm Maverick balloon and  this also proved unsuccessful. The 7 [place] guide catheter was exchanged  for a 7 [place] AL-1 guide catheter with side holes to improve distal  support, and the Maverick balloons reintroduced. After significant attempts,  the standard wire was finally successfully advanced into the distal RCA.  However, the balloon could not be passed.  The wire was retracted and a  rotafloppy extra-support wire advanced into the distal section of the  vessel.  The patient was then given aminophylline pretreatment and  rotational arthrectomy then performed using a 1.25 mm bur. A total of three  passes were made at 6, 9 and 11 seconds in the distal RCA.  Repeat  angiography showed recanalization of the vessel with significant improved  flow into the PDA. There was a mild amount of contrast extravasation of the  distal vessel consistent with wire perforation.  The bur was removed and a  2.0 x 15 mm Maverick balloon introduced. A total of four inflations was  performed within the distal RCA at 8 atmospheres for 30 seconds.  Two  additional inflations performed within the proximal segment of the PDA at 6  atmospheres for 30 seconds to stabilize this vessel, which appeared somewhat  hazy.  Repeat angiography showed evidence of what appeared to be a  dissection in the proximal segment of the posterior descending artery and  moderate residual disease in the distal RCA.  A 2.5/18 mm Cypher stent was  introduced, carefully positioned in the distal RCA extending up to a small  posterior ventricular branch which arose just prior to the PDA and deployed  at 11 atmospheres for 30 seconds.  The stent balloon was brought back slightly and used to further dilate the distal RCA at 10  atmospheres for 30  seconds. We then elected to proceed with coverage of the RCA just proximal  to the stent with an additional 2.5 x 18 mm Cypher stent deployed at 12  atmospheres for 30 seconds.  Repeat angiography showed an excellent result  in the distal RCA with no significant residual  stenosis. There was improved  flow through the distal posterior descending artery. However, the proximal  PDA appeared to have dissection. A 2.5 x 20 mm Quantum Maverick balloon was  introduced, carefully positioned in the proximal segment of the PDA and  dilated at 4 atmospheres for 180 seconds to stabilize the dissection.  Two  additional inflations were performed in the RCA within the distal and  proximal segment to the stented region at 14 atmospheres for 30 seconds each  and in the mid section at 16 atmospheres for 30 seconds.  Repeat angiography  was performed after the administration of intracoronary nitroglycerin  showing excellent result with no residual stenosis in the distal RCA.  There  appeared to be stable type A dissection involving the vessel wall in the  proximal segment of the PDA that did not appear to compromise the lumen.  There was TIMI-3 flow through the PDA and perhaps 30-40% residual stenosis.  During placement of the Cypher stent, an attempt was briefly made to pass  this into the proximal segment of the PDA. However, due to the bend, this  proved difficult and we did not reattempt this for fear of damaging the  vessel. Consideration was given for the placement of a Pixel stent.  However, due to the brisk flow and no evidence of luminal compromise, we  felt that leaving this segment alone and allowing to heal would be prudent.  Final angiography was then performed in various projection again showing  TIMI-3 flow through the PDA and distal right coronary artery with stable  dissection of the proximal segment of the PDA.  The guide catheter was then  removed and the sheath secured  in position. The patient tolerated the  procedure well and was transferred to the ward in stable condition. This was  a long procedure due to extensive amount of work necessary to cross the  distal RCA lesions.   FINAL RESULTS:  Successful rotational arthrectomy, angioplasty and stent  placement of the distal right coronary artery with reduction of subtotal 99%  narrowing to 0% with placement of two 2.5 x 18 mm Cypher stents with  improvement of TIMI grade 1 to TIMI grade 3 flow into the PDA.   ASSESSMENT AND PLAN:  The patient is a 60 year old gentleman with advanced  coronary artery disease involving the distal right coronary artery.  We  successfully recannulated this vessel. Will allow the patient to heal. He  will continue on Plavix indefinitely and will consider re-look angiography  in 3-4 months to reassess the proximal posterior descending artery.  Should  this area not heal appropriately or have significant residual stenosis, consideration may then be given towards placement of a stent.                                                                         Veneda Melter, M.D. [facility]    NG/MEDQ  D:  07/19/2002  T:  07/22/2002  Job:  [id number]   cc:   Learta Codding, M.D. [facility]   Scott A. Gerda Diss, M.D.

## 2011-03-18 NOTE — H&P (Signed)
[place]. [facility]  Patient:    [name], [name] Visit Number: [id number] MRN: [id number]          Service Type: MED Location: [phone] Attending Physician:  Daisey Must Dictated by:   Daisey Must, M.D. [facility] Proc. Date: 03/10/02 Admit Date:  03/10/2002   CC:         Lilyan Punt, M.D.   History and Physical  CHIEF COMPLAINT:  Chest pain.  HISTORY OF PRESENT ILLNESS: The patient is a 60 year old male with a known history of coronary artery disease. His cardiac history is significant for an initial cardiac catheterization in July of 2000 after he presented to the office with symptoms of recent onset chest pain. He had an outpatient stress Cardiolite which suggested prior inferior infarct. On catheterization, he was found to have a chronically occluded distal right coronary artery with a very small vessel beyond this. He also had an 80% stenosis in a circumflex marginal branch with moderate disease in the LAD. He underwent stent placement in the circumflex marginal branch. There was an attempt at angioplasty of the distal right coronary artery. However, the chronic total occlusion could not be crossed with the guide wire. He subsequently represented to the hospital in September of 2002 with an acute coronary syndrome. At that time, he had normal troponins but slightly elevated CK-MB. Cardiac catheterization revealed an 80% stenosis in the mid LAD and an 80% stenosis in the circumflex proximal to and within the previously placed stent. There was chronic total occlusion of the distal right coronary artery as seen below. Ejection fraction at that time was 45-50% with inferior hypokinesis. He underwent stent placement in the left anterior descending artery and PTCA of the first obtuse marginal branch.  The patient was subsequently discharged and did well since then.  He had a followup Cardiolite in January, which showed no evidence of ischemia. He  has been otherwise doing well and free of chest pain until yesterday. He states that while he was washing his car, he developed an episode of substernal chest pain which was relieved with nitroglycerin. This morning, he was at church and walked up two flights of stairs and then back down again. After he walked down the stairs, he developed the onset of substernal chest pain, which was a pressure, squeezing type pain radiating into the left arm. It was rated at 7/10 in severity, and was associated with dyspnea, diaphoresis, and nausea. He took a total of three sublingual nitroglycerin without relief of his chest pain. He was taken to the [facility] Room where he was started on intravenous heparin and intravenous nitroglycerin with significant improvement in chest pain. On arrival to [facility], he had still mild residual chest pain but after titration of his nitroglycerin, he has been rendered chest pain free.  The patient has significant lumbar disk disease, which limits his activity. He has been recently getting steroid injections. He has had significant weight gain over the past several weeks, which he has attributed to the steroid injections. He does have some mild exertional dyspnea. No orthopnea, PND, or edema that he is aware of. As described above, he had not had any angina up until yesterday.  PAST MEDICAL HISTORY: 1. CAD, as above. 2. Hypertension. 3. Hyperlipidemia. 4. Gastroesophageal reflux disease. 5. Diverticulosis. 6. 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 7. Benign prostatic hypertrophy.  Current medications, allergies, social history, family history, and review of systems are  as noted in the admission hand written physicians assistant admission note.  PHYSICAL EXAMINATION:  GENERAL: This is a well appearing male in no acute distress.  VITAL SIGNS: Temperature 98.1, pulse 80 and regular, respirations 14, blood pressure  130/80.  SKIN: Warm and dry without generalized rash.  HEENT: Normocephalic, atraumatic. Sclerae anicteric. ______ are unremarkable. Oral mucosa unremarkable.  NECK: No adenopathy or thyromegaly. Carotid upstroke normal without bruits. No JVD.  CHEST: Clear to percussion and auscultation.  CARDIAC: Apical and pulse nondisplaced. Regular rate and rhythm. Positive S4, normal S1 and S2 without rub, murmur, or S3.  ABDOMEN: Soft and nontender without bruit or organomegaly. Normal bowel sounds.  EXTREMITIES: No clubbing, cyanosis, or edema. Peripheral pulses, femoral are 2+ bilaterally without bruits. Pedal pulses reveal 2+ dorsal pedal bilaterally and trace posterior tibial bilaterally.  ECG done at [facility] showed normal sinus rhythm with T wave inversions in the inferior leads.  Followup ECG done here after arrival shows normal sinus rhythm with normalization of the inferior T waves and no acute ischemic changes.  Other laboratory data are as noted in the admission note.  ASSESSMENT AND PLAN: 1. Unstable angina: The patient has a known history of coronary artery disease    and now presents with symptoms compatible with unstable angina. Total    duration of his chest pain was approximately 2 hours and thus we do need    to rule out myocardial infarction. Plan at this point continuation of    heparin, nitroglycerin and aspirin therapy. He will also be started on    Plavix. He will be scheduled for cardiac catheterization tomorrow. This    may need to be done more urgently if he should have recurrent refractory    chest pain. In addition, he will be continued on therapy with beta blocker    and ACE inhibitor. 2. Hypertension: Continue outpatient medical regimen. 3. Hyperlipidemia: Continue statin. 4. Lumbar spine degenerative joint disease: Continue p.r.n. Vicodin. Dictated by:   Daisey Must, M.D. [facility] Attending Physician:  Daisey Must DD:  03/10/02 TD:   03/12/02  Job: [id number]

## 2011-03-18 NOTE — Op Note (Signed)
NAMEAIMAN, Oscar                           ACCOUNT NO.:  [id number]   MEDICAL RECORD NO.:  [id number]                   PATIENT TYPE:  INP   LOCATION:  3017                                 FACILITY:  [facility]   PHYSICIAN:  Hilda Lias, M.D.                DATE OF BIRTH:  11-Aug-1951   DATE OF PROCEDURE:  DATE OF DISCHARGE:                                 OPERATIVE REPORT   PREOPERATIVE DIAGNOSIS:  Degenerative disk disease, L4-5 and L5-S1, with a  chronic L4-5 and S1 radiculopathy.  Status post two lumbar decompressions.   POSTOPERATIVE DIAGNOSIS:  Degenerative disk disease, L4-5 and L5-S1, with a  chronic L4-5 and S1 radiculopathy.  Status post two lumbar decompressions.   PROCEDURE:  Bilateral L4-5 laminectomy, L4-5 and L5-S1 diskectomy, interbody  fusion, posterior lateral fusion, pedicle screw from L4 to S1 bilateral,  partial laminotomy of L3, lysis of adhesions, Cell Saver, C-arm.   SURGEON:  Dr. Jeral Fruit.   ASSISTANT:  Dr. Phoebe Perch.   CLINICAL HISTORY:  This patient was admitted because of back pain radiating  to both legs, left worse than the right one.  X-rays showed degenerative  disk disease at the level of 4-5 and 5-1.  Surgery was advised.  The risks  were explained in the history and physical.   PROCEDURE:  The patient was taken to the OR and _________.  Midline incision  from L2 down to S1 was made.  Muscle and fascia were retracted laterally.  We identified the L5-S1, as well as the L4-5 space.  With the Leks[place], we  removed the spinal _______ of 4-5 and part of 3.  We entered disk space in  the right side which is the area where he never had surgery.  Nevertheless,  there was quite a bit of adhesion.  We removed the disk at 4-5 and 5-1 and  immediately and placed a spacer to keep the space open.  Then we went to the  left side where it took [place] at least 45 minutes to do a lysis of adhesions to  decompress and clean the L4 and L5, as well as the S1 nerve root.   Then the  disk space were entered and total gross diskectomy was achieved from the  left side.  With the curette, we removed the end plate.  This procedure was  done bilaterally.  At the end, we were able to introduce at the level of 4-  5, two allografts of 12 x 26, and at the level of 5-1, two of 10 x 26.  In  the midline as well as laterally, we used morselized autograft.  From then  on, we went laterally and what looked like the pedicle of 4, 5, and 1.  Pedicle prongs were applied.  This was followed by a tap and using three  screws of 6.5 x 40 and 45 were introduced at those three  levels.  The same  procedure was done bilaterally.  This was done with the help of the C-arm.  AP and lateral showed good position of the pedicle screws.  We investigated  the foramen, and the L4 and L5 and S1 nerve root were absolutely open.  Having done this, we did a laminotomy and we opened the space at the level  of 3-4.  Then a rod was used to get the hooks together, followed by a tap.  Then we went laterally and we removed the periosteum of 4-5 and the ala of  the sacrum.  Then using a mixture of ________ autograft, that area was  filled up with this material.  Then a cross link with __________ was used.  From then on the area was irrigated.  The _________ was negative.  Then the  wound was closed with Vicryl and Steri-Strips.                                               Hilda Lias, M.D.   EB/MEDQ  D:  10/07/2003  T:  10/07/2003  Job:  [id number]

## 2011-03-18 NOTE — Op Note (Signed)
NAMEALEJANDRA, BARNA                 ACCOUNT NO.:  [id number]   MEDICAL RECORD NO.:  [id number]          PATIENT TYPE:  AMB   LOCATION:  DAY                           FACILITY:  [facility]   PHYSICIAN:  Vickki Hearing, M.D.DATE OF BIRTH:  03/29/1951   DATE OF PROCEDURE:  02/18/2005  DATE OF DISCHARGE:                                 OPERATIVE REPORT   MEDICAL HISTORY:  This is a 60 year old male who presented with a chief  complaint of left knee pain.  He was having mechanical symptoms.  His  clinical exam suggested a medial meniscal tear.  This was confirmed by MRI.  He presented for left knee arthroscopy and medial meniscectomy.   PREOPERATIVE DIAGNOSIS:  Medial meniscal, tear left knee.   POSTOPERATIVE DIAGNOSES:  Medial meniscal tear, osteoarthritis, left knee.   PROCEDURE:  Arthroscopy, partial medial meniscectomy, chondroplasty of  medial femoral condyle.   SURGEON:  Vickki Hearing, M.D.   ASSISTANT:  Victoriano Lain.   ANESTHETIC:  General.   OPERATIVE FINDINGS:  There was a moderate amount of arthritis in the  patellofemoral compartment and the medial compartment.  There was a torn  medial meniscus at the posterior horn.  The medial femoral condyle lesion  was grade 2 and was associated with a chondral flap.  The patellofemoral  compartment had grade 2 changes as well.   The remaining structures in the knee were normal.   The patient was identified in preop holding area as Faylene Kurtz, his history  and physical was updated,  he was given preoperative Ancef 1 gram.  He was  taken to the operating room for general anesthesia.  After successful  anesthesia, his left lower extremity was prepped and draped with a sterile  technique, at which time a time-out was taken and the procedure was  confirmed as a left knee arthroscopy with planned medial meniscectomy and  Faylene Kurtz, antibiotics were given within an hour of the skin incision,  equipment necessary to complete  the procedure was confirmed to be in the  room.   A two-incision diagnostic arthroscopy was performed.  The structures in the  knee were palpated using a probe and visualized arthroscopically.  The  findings are as stated.  Through the  medial portal a straight duckbill  forceps was used to resect the meniscal tear.  The fragments were removed  from the joint using a motorized shaver and the remaining meniscus was  balanced with a combination of motorized shaver and arthroscopic wand.  A  straight shaver was used to perform a chondroplasty on the medial femoral  condyle.  The knee was irrigated and the knee was washed to clear any  debris.  The knee was injected with 30 mL of plain 0.5% Marcaine.  Steri-  Strips were applied to the incisions.  A Cryo/Cuff was applied along with  the sterile dressings.  The patient was extubated and taken to the recovery  room in stable condition.   Postop plan is for full weightbearing, physical therapy can start on  Tuesday.  Follow-up will be on Monday.  He is discharged on Lorcet Plus one  q.4h. p.r.n. for pain, #60. CPT code is [phone], ICD-9 code as 717.2 and  715.16.      SEH/MEDQ  D:  02/18/2005  T:  02/18/2005  Job:  [id number]

## 2011-03-18 NOTE — Discharge Summary (Signed)
[place]  Patient:    [name], [name] Visit Number: [id number] MRN: [id number]          Service Type: MED Location: [phone] Attending Physician:  Lenoria Farrier Dictated by:   Joellyn Rued, P.A.-C. Admit Date:  05/23/2002 Discharge Date: 05/24/2002                    Referring Physician Discharge [place]  DATE OF BIRTH:  1950/12/27  SUMMARY OF HISTORY:  [name] is a 60 year old white male, who presented having occasional exertional chest discomfort since the preceding Saturday. He feels that it has actually worsened since that time and has had to start taking 2-3 nitroglycerins a day.  He does not feel it is as severe as his May symptoms, but it is the same type of discomfort.  He also describes shortness of breath and dyspnea on exertion and diaphoresis.  It is noted that after his catheterization last time, he has had two days worth of itching, and he was prescribed Benadryl.  His history is notable for a stent to the OM-1 in July 2000, a stent to the LAD, and angioplasty to the OM in September 2002, angioplasty to the OM-1 in May 2003.  Last catheterization was in May 2003.  This showed a 20% left main, 30% LAD, 70% in-stent OM-1, chronically occluded RCA.  He has a history of hypertension, hyperlipidemia, obesity, DJD, GERD, diverticulosis, hiatal hernia, BPH, and femoral bruits.  LABORATORY DATA:  In the emergency room, H&H was 11.60 and 35.1, normal indices, platelets 117, WBC 3.9.  CKs and troponins were negative for myocardial infarction.  Sodium was 141, potassium 4.5, BUN 13, creatinine 1.3, glucose 112.  EKG showed normal sinus rhythm.  HOSPITAL COURSE:  [name] was admitted to 6500 and placed on IV heparin and his home medications.  Overnight, he was still having chest discomfort despite negative CK, isoenzymes, and troponins.  It was noted on the 25th that he did have thrombocytopenia.  His admission platelets  were 117, and they were 103 on May 24, 2002.  In searching through his old medical records on admission May 3, prior to his catheterization, they were 159.  After he was started on Plavix at DC, they were 108.  On May 24, 2002, he underwent cardiac catheterization by Dr. Andee Lineman.  His EF was 45-50% with inferior hypokinesis and 1+ MR.  The LAD stent was patent.  He had a 40% mid and distal LAD.  The OM-1 had a 40-50% ostial lesion.  The OM stent was patent.  The RCA had a 30% proximal lesion, 50% mid lesion, 60% distal lesion, a 100% distal lesion involving the PDA which was felt to be chronically occluded.  An RV branch also had a high-grade 90-805 lesion that was also chronic.  Post sheath removal and bed rest, he was ambulating without difficulty.  Dr.  Andee Lineman felt that he could be discharged on the evening of his catheterization postambulation.  DISCHARGE DIAGNOSES: 1. Unstable angina. 2. History as previously.  DISPOSITION:  DISCHARGE MEDICATIONS:  1. His Corgard was decreased to 20 mg q.d. secondary to bradycardia.  2. His Nitro-Dur was increased to 0.6, apply q.a.m. remove q.p.m.  3. He was asked to continue his Plavix 75 1 q.d.  4. Coated aspirin 325 q.d.  5. Prinivil 10 b.i.d.  6. Zocor 80 q.d.  7. Nexium 40 q.d.  8. Lasix 20 q.d.  9. Cardura  4 q.d. 10. Nitroglycerin p.r.n. 11. Darvocet and Vicodin as previously.  DISCHARGE INSTRUCTIONS: 1. He was advised no lifting, driving, sexual activity, or heavy exertion for    two days. 2. Maintain low salt, fat, cholesterol diet. 3. If he had any problems with his catheterization site, he was asked to call    [place] immediately. 4. He will keep his appointment with Dr. Chales Abrahams on July 09, 2002, at    11 a.m. Dictated by:   Joellyn Rued, P.A.-C. Attending Physician:  Lenoria Farrier DD:  05/24/02 TD:  05/24/02 Job: [id number]

## 2011-03-18 NOTE — Discharge Summary (Signed)
NAMEKELCEY, WICKSTROM                           ACCOUNT NO.:  [id number]   MEDICAL RECORD NO.:  [id number]                   PATIENT TYPE:  INP   LOCATION:  3017                                 FACILITY:  [facility]   PHYSICIAN:  Danae Orleans. Venetia Maxon, M.D.               DATE OF BIRTH:  [birth year]   DATE OF ADMISSION:  10/07/2003  DATE OF DISCHARGE:  10/11/2003                                 DISCHARGE SUMMARY   REASON FOR ADMISSION:  1. Lumbar disk degeneration.  2. Hypertension.   Patient is a 60 year old man who is a patient of Dr. Cassandria Santee who was  admitted to the hospital with inability to walk complaining of pain down  both legs, left worse than the right, developing weakness in dorsiflexion on  the left with weakness in the quadriceps.  Patient initially underwent  conservative management but did not get better.  He had previously undergone  decompression at L4-5 and L5-S1 levels on the left and he had an L3-4  diskectomy many years ago because of a footdrop.  The patient was admitted  to the hospital and underwent decompressive procedure L4-5 and L5-S1  __________ surgery along with pedicle screw fixation.  Postoperatively he  gradually mobilized, was doing better on October 11, 2003, and was  discharged home with instructions to follow up with Dr. Jeral Fruit in 3 weeks  and instructed to wear the brace when up.   DISCHARGE MEDICATIONS:  1. Percocet.  2. Valium.   FINAL DIAGNOSES:  1. Lumbar disk degeneration.  2. Hypertension.   DISCHARGE CONDITION:  Improved.                                                Danae Orleans. Venetia Maxon, M.D.    JDS/MEDQ  D:  11/24/2003  T:  11/25/2003  Job:  [id number]

## 2011-03-18 NOTE — Discharge Summary (Signed)
[place]. [facility]  Patient:    [name], [name]                        MRN: [id number] Adm. Date:  [id number] Disc. Date: [id number] Attending:  Danella Penton                           Discharge Summary  ADMISSION DIAGNOSIS:  Chronic left L4-5 radiculopathy with weakness of the left foot secondary to spondylosis.  FINAL DIAGNOSES: 1. Chronic left L4-5 radiculopathy with weakness of the left foot secondary to    spondylosis. 2. Acute urinary retention with mild hypertrophy of the prostate.  HISTORY OF PRESENT ILLNESS:  Mr. Valletta is a gentleman who in the past underwent surgery because of foot drop.  The patient did well, but lately had been complaining of back pain with radiation down to the left leg.  The pain had been persistent and went to the left leg.  On one occasion it went to the right side.  In view of no improvement, a myelogram was done which showed spondylosis at the level of L3-4 and L4-5.  Surgery was advised.  LABORATORY DATA:  None.  COURSE IN THE HOSPITAL:  The patient was taken to surgery and a left L3-4 and L4-5 foraminotomy with lysis of adhesions was done.  The patient did well, but developed urinary retention.  He was seen by a urologist and a catheter was inserted.  He had problems in the past.  The patient had been seen by physical therapy.  Today he is feeling much better and he is ready to go home.  CONDITION ON DISCHARGE:  Improving.  DISCHARGE MEDICATIONS:  Percocet and diazepam.  DIET:  Regular.  ACTIVITY:  He is not to drive until he sees me.  FOLLOW-UP:  We are going to set up an appointment to be seen at [facility] for further physical therapy.  Also, we are going to set up an appointment with Loraine Leriche C. Vernie Ammons, M.D., from the urology service.  DIET:  Regular. DD:  01/10/01 TD:  01/10/01 Job: [id number]

## 2011-03-18 NOTE — H&P (Signed)
[name], [name]                 ACCOUNT NO.:  [id number]   MEDICAL RECORD NO.:  [id number]          PATIENT TYPE:  AMB   LOCATION:  DAY                           FACILITY:  [facility]   PHYSICIAN:  Vickki Hearing, M.D.DATE OF BIRTH:  05/14/1951   DATE OF ADMISSION:  DATE OF DISCHARGE:  LH                                HISTORY & PHYSICAL   CHIEF COMPLAINT:  Left knee pain.   HISTORY:  This is a 60 year old male with three to four weeks' duration of  constant moderate dull aching left knee pain relieved initially with  Tylenol, aggravated by moving and motion in the context of a fall three  weeks ago with giving way of the knee and associated signs and symptoms of  swelling and giving way.   REVIEW OF SYSTEMS:  He has had chest pain, angina, heart attack, phlebitis,  poor stream, reflux, numbness, weakness, unsteady gait, joint pain,  rheumatoid arthritis, joint swelling, sinusitis, bee sting allergy, seasonal  allergies, sinus problems and itching.   ALLERGIES:  1.  BEE STINGS.  2.  PENICILLIN.   MEDICAL HISTORY:  1.  Heart disease.  2.  Hypertension.  3.  Back trouble with four back surgeries.  4.  Multiple stents placed in his coronaries.  5.  One neck procedure.  Dr. Jeral Fruit has evaluated him for his neck and      recommended no surgery.   MEDICATIONS:  1.  Duragesic patch.  2.  Lidoderm patch.  3.  Fentanyl.  4.  Plavix.  5.  Aspirin.  6.  Doxazosin 4 mg.  7.  Furosemide 20 mg.  8.  Nexium 40 mg.  9.  Iron.  10. Protegra Cardio.  11. Quinapril.  12. Zyrtec.  13. Lyrica.  14. Zetia.  15. Lipitor.  16. Nadolol.  17. Niaspan.   PHYSICAL EXAMINATION:  He is noted to have normal development, grooming,  hygiene, nutrition, body habitus and mesomorphic.  He is alert and oriented  x 3 with normal sensation, except over his left knee joint with persistent  numbness from previous back surgery.  Pulses normal.  Venous status:  None.  Temperature normal.  Edema:   No.  Skin normal.  Left knee range of motion  normal.  Strength normal.  Stability normal.  Inspection was abnormal.  There was tenderness over the medial joint line with swelling, effusion and  crepitance.  His opposite extremity showed no abnormal findings and his  upper extremities were normal.   His MRI showed a torn medial meniscus of the left knee.  There was  patellofemoral degenerative disease and degeneration in the medial  compartment.  There was also a prominent medial plica.   Recommendations are made for arthroscopy, left knee, and resection of the  torn medial meniscus.      SEH/MEDQ  D:  02/17/2005  T:  02/17/2005  Job:  [id number]   cc:   [facility]  Fax: [phone]

## 2011-03-18 NOTE — Cardiovascular Report (Signed)
[name], [name]                           ACCOUNT NO.:  [id number]   MEDICAL RECORD NO.:  [id number]                   PATIENT TYPE:  OIB   LOCATION:  2899                                 FACILITY:  [facility]   PHYSICIAN:  Veneda Melter, M.D. [facility]               DATE OF BIRTH:  1951/06/29   DATE OF PROCEDURE:  10/09/2002  DATE OF DISCHARGE:                              CARDIAC CATHETERIZATION   PROCEDURE:  1. Left heart catheterization.  2. Left ventriculogram.  3. Selective coronary angiography.   CARDIOLOGIST:  Veneda Melter, M.D.   DIAGNOSES:  1. Coronary atherosclerotic disease.  2. Patent stents left circumflex artery and right coronary artery.  3. Mild left ventricular systolic dysfunction.   HISTORY:  The patient is a 60 year old gentleman with known history of  coronary artery disease who has previously undergone percutaneous  intervention with stent placement to left circumflex artery and recently to  the distal right coronary artery in September 2003.  At that time, he had  occluded vessel with a difficult intervention.  He has done remarkably well  without recurrence of discomfort.  Due to the difficulty of opening the  occluded vessel with placement of two Cypher stents, it was recommended  relook angiography be performed to ensure patency of the vessel.  In  addition, the patient is requiring back surgery in the near future and will  require cardiac clearance.   TECHNIQUE:  Informed consent was obtained.  The patient was brought to the  cardiac catheterization lab.  A 6-French sheath was placed in the right  femoral artery using modified Seldinger technique.  The 6-French JL4 an JR4  catheter were then used to engage the left and right coronary arteries.  Selective angiography was performed in various projections using manual  injection of contrast.  A 6-French pigtail catheter was then advanced in the  left ventricle and left ventriculogram performed using power  injection of  contrast.  At the termination of the case, catheters and sheaths were  removed and manual pressure applied until adequate hemostasis was achieved.  The patient tolerated the procedure well and was transferred to the floor  instable condition.   FINDINGS:  1. Left main trunk:  Mild irregularities.  2. LAD:  This is a medium caliber vessel that provides a several small     diagonal  branches.  The LAD has mild plaque of 40 to 50% in the proximal     segment.  The distal vessel has mild diffuse disease of 30%.  The     diagonal branch is trivial in size.  3. Left circumflex artery:  This is a large caliber vessel that provides a     large bifurcating first marginal branch in the mid section and a small     second marginal branch distally.  The proximal A-V circumflex has mild     diffuse disease of 30.  The first marginal branch has moderate disease of     40 to 50% prior to the bifurcation.  The stent in the inferior position     is widely patent with mild in-stent restenosis of 30 to 40%.  4. Right coronary artery:  This is dominant.  It is a small caliber vessel     that provides a small posterior descending artery at its terminal     segment.  The right coronary artery has moderate disease of 30% in     proximal and mid section.  There is then a tubular narrowing of 50% at     the junction of the mid and distal segments.  The distal right coronary     artery has evidence of previously placed Cypher stents that are widely     patent.  There is mild disease of 30% immediately prior to and following     the stented segment.  The proximal posterior descending artery is patent     with mild disease of 30% on the inferior border in the area of previous     dissections, appears to be well healed.  5. Left ventricle:  Normal end-systolic and end-diastolic dimensions.     Overall left ventricular function is low-normal to mildly impaired with     ejection fraction of approximately  45 to  50%.  There is hypokinesis of     the basal and mid inferior wall.  No mitral regurgitation was noted.  LV     pressure is 160/20, aortic 160/90.  LV EDP equals 25.   ASSESSMENT/PLAN:  The patient is a 59 year old gentleman who is three months  status post placement of drug-eluting stents to the distal right coronary  artery.  These are patent and appear well opposed.  There is very mild  aneurysmal dilatation in the distal vessel in the area of prior perforation  and dissection that appears stable.  These are not flow limiting and do not  appear to represent dilatation due to the drug coating. As the stents appear  to be well opposed, I believe the risk fo performing MRI is low.  The  patient has stable coronary artery disease and should also be at low risk  for cardiovascular event at the time of proposed surgery.                                                 Veneda Melter, M.D. [facility]    NG/MEDQ  D:  10/09/2002  T:  10/09/2002  Job:  [id number]   cc:   Learta Codding, M.D. [facility]   Scott A. Gerda Diss, M.D.  [address], [address]  [place]  [place] [address]  Fax: [phone]   Hilda Lias, M.D.  [address]  [place], [place] [address]  Fax: [phone]

## 2011-03-18 NOTE — Cardiovascular Report (Signed)
NAMESHIMSHON, NARULA                             ACCOUNT NO.:  [id number]   MEDICAL RECORD NO.:  [id number]                   PATIENT TYPE:   LOCATION:                                       FACILITY:   PHYSICIAN:  Learta Codding, M.D. [facility]             DATE OF BIRTH:  02/10/1951   DATE OF PROCEDURE:  05/24/2002  DATE OF DISCHARGE:                              CARDIAC CATHETERIZATION   PROCEDURES PERFORMED:  1. Left heart catheterization with selective coronary angiography.  2. Ventriculography.   DIAGNOSES:  1. Patent stent x2 of the old obtuse marginal branch in the left anterior     descending.  2. Chronic occlusion of the distal right coronary artery.  3. High-grade stenosis of a small artery branch, which appears chronic.   INDICATION:  The patient is a 60 year old male with a history of coronary  artery disease and prior coronary intervention to the LAD and obtuse  marginal branch.  The patient now presents with atypical chest pain, is  ruled out for myocardial infarction and was referred to reassess his  coronary anatomy.   DESCRIPTION OF PROCEDURE:  After informed consent was obtained, the patient  was brought to the catheterization laboratory.  A 6 French arterial sheath was placed in the right femoral artery using the  modified Seldinger technique.  A 6 [place] and JR4 catheters were used to  engage the left and right coronary ostia respectively.  A 6 French angled  pigtail catheter was used to performed ventriculography. At the termination  of the procedure all catheters and sheaths were removed and the patient was  brought back tot he holding area and no complications were encountered.   FINDINGS:  HEMODYNAMICS:.  Left ventricular pressure 109/13 mmHg, aortic pressure  108/59 mmHg. No gradient on aortic pullback.   VENTRICULOGRAPHY:  Ejection fraction approximately 45-50% with mild LV  dysfunction. There is an area of hypokinesis of the inferior wall extending  from the base to the mid ventricle.  There is 1+ mitral regurgitation noted.   SELECTIVE CORONARY ANGIOGRAPHY:  1. Left main coronary artery is a large caliber vessel with no evidence of     flow-limiting disease.  2. Left anterior descending artery demonstrates a patent stent in the mid     left anterior descending. There is diffuse disease in the distal vessel     of approximately 40%.  The remainder of the vessel, however, does not     demonstrate flow-limiting disease.  Diagonal branches are free of flow-     limiting coronary artery disease.  3. The circumflex coronary artery is a large caliber vessel.  A stent in the     inferior branch of the first obtuse marginal branch is patent.  There is     a more proximal 40-50% stenosis and the superior limb has approximately     30-40% stenosis.  4. Right  coronary artery is a large caliber vessel which is chronically     occluded in its distal course. Proximally there is a 30% stenosis. In the     mid vessel there is a 50% stenosis and more distal there is a diffuse 60%     stenosis.  The PDA fills from collateral branches from the LAD. There is     an RV branch which has a proximal 90% and 80% stenosis, which is chronic     compared to prior angiograms.   IMPRESSION/PLAN:  Patent stents x2 with diffusely diseased right coronary  artery with chronic distal occlusion.  The plan is to continue on medical  therapy at this point, particularly as the patient has rather atypical chest  pain. Inderal will be added to his medical regimen and Plavix can also be  continued long-term.                                                 Learta Codding, M.D. [facility]    GED/MEDQ  D:  07/11/2002  T:  07/12/2002  Job:  [id number]   cc:   Gerda Diss, Dr.

## 2011-03-18 NOTE — Discharge Summary (Signed)
[name], [name]                           ACCOUNT NO.:  [id number]   MEDICAL RECORD NO.:  [id number]                   PATIENT TYPE:  OIB   LOCATION:  2902                                 FACILITY:  [facility]   PHYSICIAN:  Guy Franco, P.A. [facility]                DATE OF BIRTH:  06/29/1951   DATE OF ADMISSION:  07/19/2002  DATE OF DISCHARGE:  07/20/2002                           DISCHARGE SUMMARY - REFERRING   DISCHARGE DIAGNOSES:  1. Coronary artery disease.  2. Hypertension.  3. Hyperlipidemia, treated.  4. Gastroesophageal reflux disease.   HOSPITAL COURSE:  [name] is a 60 year old male patient who was admitted  for outpatient cardiac catheterization on 07/19/02 for complaints of  exertional chest pain.  Cardiac catheterization was performed by Dr. Veneda Melter, and the patient was found to have the following:  Left main 20%, LAD  30 to 40% proximal and mid lesions, circumflex with a 30% OM1 with in-stent  restenosis, right coronary artery with a 60% mid lesion followed by 99%  distal lesion.  LE-gram not performed.  The patient then underwent a  PCR/stent placement to the distal RCA with a 99% stenosis reduced to a 0%  post procedure stenosis.  The patient will be placed on Plavix indefinitely  and a relook angiogram will need to be scheduled in approximately three to  four months.  The patient remained in the hospital overnight and was  prepared for discharge to home the following day in stable condition.   During his hospitalization, the patient underwent a 2-D echo which revealed  an EF of 55 to 60% with no evidence of LV wall motion abnormalities.  There  is no significant pericardial effusion.   Labs done during the patient stay include BUN 9, creatinine 0.7, sodium 140,  potassium 3.7, hemoglobin 11.5, hematocrit 33.9, white count 4.8, and  platelets 107,000.   DISCHARGE MEDICATIONS:  The same as his home medications which include  Plavix 75 mg one p.o. q.d.,  multivitamin daily, baby aspirin daily, Demerol  40 mg a day, Accupril 40 mg a day, Cardura 4 mg a day, Lasix 40 mg a day,  Nexium 40 mg a day, Imdur 60 mg a day, Sil-Tex 1 p.o. q.d.  I have given him  a prescription for sublingual nitroglycerin p.r.n. chest pain.  He can use  Tylenol as needed for pain.  No strenuous activity for two days, then resume  normal activity.  He is o.k. to return to work on 07/22/2002.  Remain on a  low-fat diet.  The cardiac catheterization will be assessed some more, and  my office will call him with a appointment to see a P.A. in two weeks and  Dr. Chales Abrahams in three months.  Guy Franco, P.A. [facility]    LB/MEDQ  D:  07/20/2002  T:  07/23/2002  Job:  [id number]   cc:   Veneda Melter, M.D. [facility]

## 2011-03-18 NOTE — Discharge Summary (Signed)
[place]. [facility]  Patient:    [name], [name] Visit Number: [id number] MRN: [id number]          Service Type: Attending:  Daisey Must, M.D. [facility] Dictated by:   Pennelope Bracken, N.P. Adm. Date:  07/24/01 Disc. Date: 07/26/01   CC:         Dr. Lilyan Punt, primary care  Ladona Horns. Mariel Sleet, M.D., [place], [place]   Discharge Summary  REASON FOR ADMISSION:  This delightful 60 year old gentleman who has a history of coronary artery disease with stent placement in the OM as well as failed attempt to angioplasty the RCA.  His stents had been working well since then. Until recently, where he has experienced recurrent substernal chest discomfort, dyspnea and diaphoresis.  He was brought by ambulance to our emergency department where he was stabilized heparin and nitroglycerin.  ADMISSION LABS:  CK 133, CK-MB 5.6, relative index 4.2 and troponin 0.01. His hemoglobin on admission was 13.0 with an RBC of 4.13, hematocrit 37.8.  This was drawn again and RBC was found to be 3.76, hemoglobin 11.8 and hematocrit 33.7.  Platelets were low at 130 on admission.  A third draw produced the following results:  RBC 3.90, hemoglobin 12.1 and hematocrit 35.1, platelet count was even lower at 121.  The patient was taken to cath where it was found that there was an 80% occlusion in the mid LAD, with diffuse disease both proximally and distally. The distal limb of the OM.  This seemed to have an 80% proximal in-stent restenosis.  The RCA was 100% distally occluded with right-to-right and left-to-right collateral formation.  Ejection fraction was estimated at 45 to 50% and severe hypokinesis was seen in the inferior ventricular wall.  The patient was taken subsequently for intervention where a stent was placed in the mid LAD reducing the degree of occlusion there from 80% to zero.  A cutting balloon was used to reduce stenosis in the OM from 70% to 25%, an intrastent restenosis  was reduced in this manner from 80 to 25%.  Post intervention, the patient was managed with both heparin and Integrelin.  The patient recovered uneventfully, continued on his medications from home. Serial labs were followed and these revealed fluctuations in RBCs, hemoglobin and hematocrit as well as platelets.  The patients hemoglobin was 11.8 on admission and on day of discharge it was 11.3.  The patients hematocrit was 33.7 on the day of admission and had declined to 32.6 on day of discharge. The patients rbcs were 3.76 on admission, declining to 3.61 at discharge. Platelets  were 130 on admission and 114 on the day of discharge.  Cardiac markers were within normal limits by discharge; CK total was 71 and CK-MB 3.5 down from 6.4 previously, troponin was negative.  Anemia studies were drawn and all values were within normal limits.  Iron was 129, TIBC 274, percent sat was 47, B12 was 366, folate was greater than 20 and ferritin was 38.  Chest x-ray performed on admission shows stable cardiomegaly and no active disease.  The patient is discharged to home with his wife in stable condition.  DISCHARGE PHYSICAL EXAMINATION:  GENERAL APPEARANCE:  A well-developed, well-nourished white male in no acute distress.  VITAL SIGNS:  Blood pressure 115/65, pulse 70, respiratory rate 20, temperature afebrile.  Pulse-ox is 94% on room air.  A 12-lead EKG obtained on the day of discharge showed normal sinus rhythm without ST segment changes.  LUNGS:  Clear to auscultation.  CARDIOVASCULAR:  Regular rate and rhythm without murmurs, rubs, or gallops. Clear S1 and S2.  EXTREMITIES:  Without clubbing, cyanosis, or edema.  No evidence of hematoma or bruit at the right groin.  DISCHARGE DIAGNOSES: 1. Coronary artery disease, status post stent of obtuse marginal in 2000    with restenosis.  A percutaneous coronary intervention stent placed in    the mid left anterior descending and balloon  angioplasty performed of the    obtuse marginal. 2. Hypertension. 3. Hyperlipidemia. 4. Anemia of unknown origin. 5. Thrombocytopenia. 6. Chronic back pain. 7. History of colon polyp with positive family history of colorectal cancer.  DISCHARGE MEDICATIONS:  The patient will resume his home medications including: 1. Accupril 40 mg one q.d. 2. __________ 40 mg one q.d. 3. Cardura 4 mg one half tablet q.d. 4. Plavix 75 mg one q.d. with food. 5. Lipitor 40 mg q.d. 6. Aspirin 325 mg q.d. 7. Celebrex as directed.  (The patient does not want to take both aspirin and    Celebrex so we will discontinue his Celebrex except for p.r.n. use.) 8. Vicodin p.r.n. 9. Nitropatch 0.4 mg one q.d. with 10 to 12 hour removal q.d.  ACTIVITY:  Restrictions include heavy lifting, driving or sexual activity for two days.  DIET:  The patient is aware to consume a low fat, low salt, low cholesterol diet.  DISCHARGE INSTRUCTIONS:  He agrees to call if his groin wound becomes painful or hard.  He will follow up his hematologic irregularities with Dr. Ladona Horns. Mariel Sleet, M.D., on August 07, 2001, at 2:10 and this appointment has been secured and records have been faxed.  He will return to [facility] on August 03, 2001, for groin check with P.A. at 10 oclock and will see Daisey Must, M.D., August 27, 2001, at 12:15.  He knows to call the clinic in the interim with any questions, problems, or concerns or change or increase in symptoms. Dictated by:   Pennelope Bracken, N.P. Attending:  Daisey Must, M.D. [facility] DD:  07/26/01 TD:  07/26/01 Job: [id number]

## 2011-03-18 NOTE — Discharge Summary (Signed)
[name], [name]                 ACCOUNT NO.:  [id number]   MEDICAL RECORD NO.:  [id number]          PATIENT TYPE:  INP   LOCATION:  A340                          FACILITY:  [facility]   PHYSICIAN:  Scott A. Gerda Diss, MD    DATE OF BIRTH:  05-17-1951   DATE OF ADMISSION:  01/29/2007  DATE OF DISCHARGE:  04/04/2008LH                               DISCHARGE SUMMARY   DISCHARGE DIAGNOSES:  1. Right lower lobe pneumonia.  2. Heart disease.  3. Dehydration.  4. Altered mental status.   HOSPITAL COURSE:  This patient was admitted just very drowsy coughing,  fever or congestion, felt short-winded, stated he felt very bad, did not  recall all the events of the morning in terms of how he got to the  doctor's office, was found to be dehydrated by orthostatic, was admitted  at that point in time with presumed right lower lobe pneumonia.  The x-  ray did show right lower lobe pneumonia.  His blood counts overall  looked good.  O2 saturations were acceptable.  The patient was given IV  fluids and the patient's blood pressures stabilized; IV antibiotics of  doxycycline as well as Levaquin and improved dramatically over the  course of next few days and on the date of February 02, 2007, was felt  stable for discharge.   DISCHARGE MEDICATIONS:  We are discharging him to home on all his usual  medicines plus  1. Levaquin 500 mg daily for 10 days.  2. Zithromax 500 mg daily for 3 days.   He is to follow up in our office in 10-14 days and we will need to do a  follow-up chest x-ray to make sure this clears.  If it does not clear,  he may need to have a CAT scan.  Certainly if he worsens, I would  recommend a CAT scan.  He was advised of all warning signs to watch for  and to notify us if any of these problems should occur and not to drive  over the weekend.      Scott A. Gerda Diss, MD  Electronically Signed     SAL/MEDQ  D:  02/02/2007  T:  02/02/2007  Job:  [id number]

## 2011-03-18 NOTE — Op Note (Signed)
[place]. [facility]  Patient:    [name], [name]                        MRN: [id number] Proc. Date: 01/05/01 Adm. Date:  [id number] Attending:  Danella Penton                           Operative Report  PREOPERATIVE DIAGNOSES: 1. Left L3-4, L4-5 stenosis. 2. Weakness on dorsiflexion of the left foot.  POSTOPERATIVE DIAGNOSES: 1. Left L3-4, L4-5 stenosis. 2. Weakness on dorsiflexion of the left foot.  PROCEDURES: 1. Left L3-4 foraminotomy, left L4-5 foraminotomy with lysis of adhesions. 2. Decompression of the L5, L4, and L3 nerve root. 3. Microscope.  SURGEON:  Tanya Nones. Jeral Fruit, M.D.  ASSISTANT:  Hewitt Shorts, M.D.  CLINICAL HISTORY:  The patient is a 60 year old gentleman who in the past had surgery because of foot drop.  The patient did well but now is complaining of pain from his back down to the left leg.  He has failed with conservative treatment.  X-ray shows a stenosis of the 3-4 and 4-5, worst at L4-5.  The patient wanted to go ahead with surgery.  He denies any pain in the right leg. The patient knew of the risks, such as infection, CSF leak, worsening pain, paralysis, need for further surgery, which might _____.  DESCRIPTION OF PROCEDURE:  The patient was taken to the OR and after intubation, the previous scar tissue was removed.  Then dissection was carried down in the midline, and muscle was retracted on the left side.  We were able to identify 5-1 and 4-5 easily.  Then we brought the microscope into the area, and we started drilling part of the lamina of L5 and part of the lamina of L3. At the level of 3-4, we found that indeed there was quite a bit of narrowing compromising the L4 nerve root.  Foraminotomy was accomplished.  Investigation of the disk space was completely normal.  At the level of 4-5, we found the L5 nerve root and, indeed, what we found was quite a bit of scar tissue, mostly at the level of the axilla of  the nerve root.  There was the possibility of some herniated disk and mostly hypertrophy of the ligament.  Decompression was achieved.  At the end, we had plenty of room for the L5 nerve root.  The foramen was opened after we drilled it with the drill.  Having done this, the area was irrigated.  Valsalva maneuver was negative.  During surgery we did two lumbar spine x-rays just to be sure of the location.  Then the area was irrigated.  Fentanyl and Depo-Medrol were left in the dural space.  Also, fat was left in the operative site.  Depo-Medrol and fentanyl were left, and the patient went back to the recovery room.  The wound was closed with Vicryl and Steri-Strips. DD:  01/05/01 TD:  01/06/01 Job: [id number]

## 2011-03-18 NOTE — H&P (Signed)
[place]. [facility]  Patient:    Oscar, Burns Visit Number: [id number] MRN: [id number]          Service Type: MED Location: [phone] Attending Physician:  Lenoria Farrier Dictated by:   Everardo Beals Juanda Chance, M.D. [facility] Admit Date:  05/23/2002 Discharge Date: 05/24/2002   CC:         Lilyan Punt, M.D.  Veneda Melter, M.D. [facility]  Cardiopulmonary Lab   History and Physical  CHIEF COMPLAINT:  Chest pain.  HISTORY OF PRESENT ILLNESS:  [name] is 60 years old and has documented coronary disease.  He has a chronically totally occluded right coronary artery. In 2000 he had a stent placed in the circumflex artery.  In September 2002 he had PTCA for in-stent restenosis in the circumflex artery and had a new stent placed in the LAD, and in May 2003 he had PTCA for in-stent restenosis in the circumflex artery.  Over the last four to five days he has developed chest pain, which he describes as substernal pressure coming on both with exertion and with rest.  He has had to take several nitroglycerin.  While at work at [employer] today, he developed recurrent pain and was seen by the nurse there and advised to come to the emergency room.  He had persistent pain in the emergency room requiring relief with nitroglycerin.  He did have shortness of breath and diaphoresis with his pain.  PAST MEDICAL HISTORY:  Significant for hypertension, hyperlipidemia, degenerative joint disease, and GERD.  There is no history of diabetes.  MEDICATIONS:  Nitroglycerin patch, Plavix 75 mg, aspirin 325 mg, Prinivil 10 mg b.i.d., Corgard 40 mg daily, Zocor 80 mg daily, Nexium 40 mg daily, Lasix 20 mg daily, Cardura 4 mg daily, and p.r.n. nitroglycerin.  ALLERGIES:  PENICILLIN.  SOCIAL HISTORY:  He is married, and he works at [employer].  For details of family history and review of systems, please see [name] note.  PHYSICAL EXAMINATION:  VITAL SIGNS:  Blood pressure  was 140/72 and the pulse 70 and regular.  NECK:  There was no venous distention.  The carotid pulses were full, and there were no bruits.  The thyroid was not enlarged.  CHEST:  The chest was clear without rales or rhonchi.  CARDIAC:  The cardiac rhythm was regular.  The first and second heart sounds were normal, and there were no murmurs or gallops.  ABDOMEN:  Soft with normal bowel sounds.  There was no hepatosplenomegaly.  EXTREMITIES:  No edema.  Peripheral pulses were full.  NEUROLOGIC:  No focal signs.  MUSCULOSKELETAL:  No deformities.  SKIN:  Warm and dry.  LABORATORY DATA:  EKG was normal except for a slight conduction delay.  His BUN and creatinine were 13 and 1.3.  His initial troponins and CK-MBs were negative.  IMPRESSION: 1. Chest pain consistent with unstable angina. 2. Coronary artery disease, status post multiple percutaneous coronary    interventions as described above. 3. Hyperlipidemia. 4. Hypertension. 5. Gastroesophageal reflux disease.  RECOMMENDATIONS:  Will plan to admit the patient to the hospital and treat him with IV heparin and continue his aspirin and beta blockers and nitrates.  Will plan evaluation with angiography tomorrow. Dictated by:   Everardo Beals Juanda Chance, M.D. [facility] Attending Physician:  Lenoria Farrier DD:  05/23/02 TD:  05/27/02 Job: [id number]

## 2011-03-18 NOTE — Cardiovascular Report (Signed)
[place]. [facility]  Patient:    [name], [name] Visit Number: [id number] MRN: [id number]          Service Type: MED Location: [phone] Attending Physician:  Daisey Must Dictated by:   Veneda Melter, M.D. [facility] Proc. Date: 03/11/02 Admit Date:  03/10/2002 Discharge Date: 03/12/2002   CC:         Daisey Must, M.D. [facility]  Lilyan Punt, M.D.   Cardiac Catheterization  PROCEDURES PERFORMED: 1. Left heart catheterization. 2. Left ventriculogram. 3. Selective coronary angiography. 4. Percutaneous transluminal coronary angioplasty of the first marginal    branch.  DIAGNOSES: 1. Coronary artery disease. 2. Normal left ventricular systolic function. 3. In-stent restenosis.  HISTORY OF PRESENT ILLNESS:  Mr. Housman is a 60 year old white male with advanced coronary artery disease, who has a known occlusion of the distal right coronary artery at the takeoff of the posterior descending artery as well as the left circumflex system, who has undergone percutaneous intervention with stent placement to the inferior branch of a bifurcating first marginal branch on May 27, 1999, and July 25, 2001.  The patient recently developed some substernal chest discomfort initially with exertion but then had a severe episode at rest, prompting his admission to the hospital.  He ruled out for acute myocardial infarction, and he presents now for further assessment.  TECHNIQUE:  Informed consent was obtained.  The patient was brought to the catheterization lab and a 6 French sheath placed in the right femoral artery. A 6 [place] and JR4 catheter was then used to engage the left and right coronary arteries and selective angiography performed in various projections using manual injections contrast.  A 6 French pigtail catheter was then advanced to the left ventricle and left ventriculogram performed using power injections of contrast.  FINDINGS: 1. Left main  trunk is a large caliber vessel with mild irregularities to 20%. 2. Left anterior descending:  This is medium caliber vessel that provides    several small diagonal branches in the mid section.  The LAD has moderate    diffuse disease of 30-40% along its course. 3. The left circumflex artery is a large caliber vessel that provides a large    bifurcating first marginal branch in the mid section and a small second    marginal branch distally.  The AV circumflex has mild diffuse disease of    30%, large first marginal branch has narrowings of 30-40% in its proximal    segment at the bifurcation.  The superior division then has moderate    diffuse disease of 50% in the mid section.  The inferior branch has    evidence of previously-placed stent in the proximal mid section.  There is    a moderate narrowing of 70% in the proximal stent.  The distal stent has    mild narrowing of 30-40%.  The distal native vessel has mild    irregularities.  The second marginal branch has mild irregularities. 4. Ramus intermedius:  This is a medium caliber vessel with an ostial    narrowing of 50%.  There is diffuse disease of 30-40% in the mid section. 5. Right coronary artery is dominant.  It is a medium caliber vessel that    provides the posterior descending and a small posterior ventricular branch    at the terminal segment.  The right coronary artery has moderately diffuse    disease of 50% along its entire course.  There is a severe  narrowing of    100% distally at the takeoff of the posterior descending artery, descending    into the posterior ventricular branch.  The posterior descending artery is    seen to fill the collaterals from the left circumflex system and has mild    diffuse disease. 6. Left ventricle:  Normal end systolic and end diastolic dimensions.  Overall    left ventricular function is well-preserved, ejection fraction greater than    55%, no mitral regurgitation.  LV pressure is 115/10,  aortic was 115/65,    LVEDP is 15.  With these findings, we elected to proceed with percutaneous intervention to the marginal branch of the left circumflex system.  The patient was given heparin intravenously to maintain ACT of approximately 200 seconds.  A 6 [place] CS guide catheter was used to engage the left coronary artery, and a .01 preformed support wire advanced into the distal segment of the inferior division of the first marginal branch.  A 3.0 x 15 mm cutting balloon was introduced and a total of three inflations performed within the proximal segment of the vessel for treatment of in-stent restenosis at up to 10 atmospheres for 60 seconds.  Repeat angiography showed an excellent result with less than 10% residual narrowing.  There was TIMI 3 flow through the vessel and no evidence of distal vessel damage.  Final angiography was performed in various projections, confirming no distal vessel damage.  The guide catheter was then removed with the sheath, secured into position, and the patient transferred to the holding area in stable condition.  The sheath will be removed when the ACT returns to normal.  FINAL RESULT:  Successful PTCA of the inferior division of the first marginal branch with reduction of 70% in-stent restenosis to less than 10% using a 3.0 mm cutting balloon. Dictated by:   Veneda Melter, M.D. [facility] Attending Physician:  Daisey Must DD:  03/11/02 TD:  03/13/02 Job: [id number]

## 2011-03-18 NOTE — Discharge Summary (Signed)
[facility]  Patient:    [name], [name] Visit Number: [id number] MRN: [id number]          Service Type: MED Location: [phone] Attending Physician:  Daisey Must Dictated by:   Pennelope Bracken, N.P. Admit Date:  03/10/2002 Disc. Date: 03/12/02   CC:         Lilyan Punt, M.D.   Discharge Summary  DATE OF BIRTH:  12-11-1950.  PRIMARY CARE Bevelyn Arriola:  Lilyan Punt, M.D.  REASON FOR ADMISSION:  Chest pain.  DISCHARGE DIAGNOSES: 1. Coronary artery disease, status post percutaneous transluminal coronary    angioplasty this admission for in-stent restenosis of the obtuse marginal    #1.  Previous admission July 24, 2001, for percutaneous transluminal    coronary angioplasty with stent placement to the mid-left anterior    descending coronary artery, and percutaneous transluminal coronary    angioplasty with cutting balloon to obtuse marginal #2, where there was    midvessel in-stent restenosis. 2. Obesity. 3. Hyperlipidemia. 4. Hypertension. 5. Femoral bruits. 6. Gastroesophageal reflux disease. 7. Degenerative disk disease. 8. Benign prostatic hypertrophy.  HISTORY OF PRESENT ILLNESS:  A delightful 60 year old gentleman with cardiac history as noted above, was transferred to [facility] with unstable angina.  He had done well since the previous admission September 2002, having had a negative Cardiolite in January of this year.  The day of admission he experienced exertional chest tightness which was relieved with nitroglycerin, with associated shortness of breath, diaphoresis, and nausea, with radiation to the left upper extremity.  His EKG revealed downsloping ST segment changes in inferior leads.  Given these factors, it was decided best to admit him for re-look.  HOSPITAL COURSE:  The patient was admitted to telemetry, continued on his home medications and stabilized on IV nitroglycerin and heparin  as well as Plavix. Serial labs were drawn, and the patients cardiac enzymes were negative.  He did experienced some trouble voiding on the procedure day, so a Foley was placed.  He was taken to catheterization Mar 11, 2002, by Dr. Chales Abrahams.  The findings were as follows:  Left main 20% stenosis.  LAD 30% stenosis, with two small diagonals.  The left circumflex had two OMs with a 70% in-stent restenosis in the proximal part of the OM-1 stent.  The RCA had 100% occlusion of the PDA with some collateralization seen from the left circumflex. Ventriculogram revealed an ejection fraction of greater than 55%.  PTCA was performed to the OM-1, reducing the stenosis there from 70% to less than 10%. The patient tolerated the procedure well and returned to the floor in stable condition.  DISCHARGE PHYSICAL EXAMINATION:  VITAL SIGNS:  Telemetry revealed normal sinus rhythm.  Vital signs were as follows:  115/55, 80, 16, 97% on room air, and 97.1.  GENERAL:  The day of discharge the patient offered no complaints of chest pain, shortness of breath, or palpitations.   In no acute distress.  NECK:  No JVD.  CHEST:  Clear to auscultation bilaterally.  CARDIAC:  Regular rate and rhythm, S1, S2.  EXTREMITIES:  Without cyanosis, clubbing, or edema,  Catheter site benign without sign of hematoma or bruit.  LABORATORY DATA:  At discharge, chemistry:  Sodium 140, potassium 4.3, chloride 107, CO2 27, BUN 12, creatinine 1.0, glucose 101.  Hemogram:  WBC 4.9, hemoglobin 12.6, hematocrit 36.7, and platelets 105.  Postprocedure cardiac enzymes were CK 41, MB 1.5, and troponin I  of 0.01.  A lipid profile was obtained with results as follows:  Total cholesterol 148, triglycerides 115, HDL 45, and LDL 80.  Chest x-ray showed no abnormality.  Twelve-lead EKG revealed normal sinus rhythm without ischemic changes.  DISPOSITION:  The patient is discharged to home in care of his family.  DISCHARGE MEDICATIONS:   1. Aspirin 325 mg one q.d.  2. Prinivil 10 mg b.i.d.  3. Corgard 40 mg one q.d.  4. Zocor 80 mg one q.d.  5. Humibid 600 mg one b.i.d. with water.  6. Protonix 40 mg or Nexium one q.d.  7. Lasix 20 mg one q.d.  8. Cardura 4 mg one q.d.  9. Plavix 75 mg one q.d. 10. Nitroglycerin 0.4 mg one sublingual every five minutes x3 for chest pain. 11. Nitro patch.  DISCHARGE INSTRUCTIONS:  Activity restrictions:  No heavy lifting, driving, sexual activity, or tub baths x2 days.  Diet recommended:  Heart-healthy, low-fat, low-cholesterol diet.  The patient agrees to call the office if his groin becomes hard or painful.  SPECIAL INSTRUCTIONS:  He will exercise by biking or doing water aerobics due to his degenerative disk disease for 30 minutes every day.  He declines cardiac rehab at this visit, having been through it several times in the past.  FOLLOW-UP:  With Dr. Venia Minks. on June 2 at 9 oclock at [facility].  The patient agrees to call in the interim with any problems, questions, or concerns, or change or increase in symptoms. Dictated by:   Pennelope Bracken, N.P. Attending Physician:  Daisey Must DD:  03/12/02 TD:  03/12/02 Job: [id number]

## 2011-06-13 ENCOUNTER — Other Ambulatory Visit: Payer: Self-pay

## 2011-06-13 ENCOUNTER — Emergency Department (HOSPITAL_COMMUNITY): Payer: No Typology Code available for payment source

## 2011-06-13 ENCOUNTER — Emergency Department (HOSPITAL_COMMUNITY)
Admission: EM | Admit: 2011-06-13 | Discharge: 2011-06-14 | Disposition: A | Discharge status: Still In Hospital | Payer: No Typology Code available for payment source | Source: Home / Self Care | Attending: Emergency Medicine | Admitting: Emergency Medicine

## 2011-06-13 ENCOUNTER — Encounter (HOSPITAL_COMMUNITY): Payer: Self-pay | Admitting: Emergency Medicine

## 2011-06-13 DIAGNOSIS — K219 Gastro-esophageal reflux disease without esophagitis: Secondary | ICD-10-CM | POA: Insufficient documentation

## 2011-06-13 DIAGNOSIS — Z79899 Other long term (current) drug therapy: Secondary | ICD-10-CM | POA: Insufficient documentation

## 2011-06-13 DIAGNOSIS — Z9861 Coronary angioplasty status: Secondary | ICD-10-CM | POA: Insufficient documentation

## 2011-06-13 DIAGNOSIS — R079 Chest pain, unspecified: Secondary | ICD-10-CM

## 2011-06-13 DIAGNOSIS — I10 Essential (primary) hypertension: Secondary | ICD-10-CM | POA: Insufficient documentation

## 2011-06-13 DIAGNOSIS — I252 Old myocardial infarction: Secondary | ICD-10-CM | POA: Insufficient documentation

## 2011-06-13 DIAGNOSIS — I2510 Atherosclerotic heart disease of native coronary artery without angina pectoris: Secondary | ICD-10-CM | POA: Insufficient documentation

## 2011-06-13 DIAGNOSIS — M549 Dorsalgia, unspecified: Secondary | ICD-10-CM | POA: Insufficient documentation

## 2011-06-13 HISTORY — DX: Gastro-esophageal reflux disease without esophagitis: K21.9

## 2011-06-13 HISTORY — DX: Other chronic pain: G89.29

## 2011-06-13 HISTORY — DX: Atherosclerotic heart disease of native coronary artery without angina pectoris: I25.10

## 2011-06-13 HISTORY — DX: Acute myocardial infarction, unspecified: I21.9

## 2011-06-13 HISTORY — DX: Pure hypercholesterolemia, unspecified: E78.00

## 2011-06-13 HISTORY — DX: Dorsalgia, unspecified: M54.9

## 2011-06-13 LAB — CBC
MCHC: 33.4 g/dL (ref 30.0–36.0)
Platelets: 177 10*3/uL (ref 150–400)
RDW: 14.5 % (ref 11.5–15.5)

## 2011-06-13 LAB — COMPREHENSIVE METABOLIC PANEL
Alkaline Phosphatase: 92 U/L (ref 39–117)
BUN: 10 mg/dL (ref 6–23)
Calcium: 8.8 mg/dL (ref 8.4–10.5)
GFR calc Af Amer: >60 mL/min (ref >60)
Glucose, Bld: 101 mg/dL — ABNORMAL HIGH (ref 70–99)
Total Protein: 6.4 g/dL (ref 6.0–8.3)

## 2011-06-13 LAB — CARDIAC PANEL(CRET KIN+CKTOT+MB+TROPI)
CK, MB: 2.7 ng/mL (ref 0.3–4.0)
Total CK: 46 U/L (ref 7–232)

## 2011-06-13 LAB — PRO B NATRIURETIC PEPTIDE: Pro B Natriuretic peptide (BNP): 306.8 pg/mL — ABNORMAL HIGH (ref 0–125)

## 2011-06-13 MED ORDER — ASPIRIN 81 MG PO CHEW
324.0000 mg | CHEWABLE_TABLET | Freq: Once | ORAL | Status: AC
  Administered 2011-06-13: 324 mg via ORAL
  Filled 2011-06-13: qty 4

## 2011-06-13 MED ORDER — POTASSIUM CHLORIDE CRYS ER 20 MEQ PO TBCR
40.0000 meq | EXTENDED_RELEASE_TABLET | Freq: Once | ORAL | Status: AC
Start: 2011-06-13 — End: 2011-06-13
  Administered 2011-06-13: 40 meq via ORAL
  Filled 2011-06-13: qty 2

## 2011-06-13 MED ORDER — NITROGLYCERIN 2 % TD OINT
1.0000 [in_us] | TOPICAL_OINTMENT | Freq: Once | TRANSDERMAL | Status: AC
  Administered 2011-06-13: 66.6667 [in_us] via TOPICAL
  Filled 2011-06-13: qty 1

## 2011-06-13 MED ORDER — NITROGLYCERIN 0.4 MG SL SUBL
0.4000 mg | SUBLINGUAL_TABLET | SUBLINGUAL | Status: DC | PRN
  Administered 2011-06-13 (×2): 0.4 mg via SUBLINGUAL
  Filled 2011-06-13: qty 25

## 2011-06-13 MED ORDER — MORPHINE SULFATE 2 MG/ML IJ SOLN
2.0000 mg | INTRAMUSCULAR | Status: DC | PRN
  Administered 2011-06-13: 2 mg via INTRAVENOUS
  Filled 2011-06-13: qty 1

## 2011-06-13 MED ORDER — SODIUM CHLORIDE 0.9 % IV SOLN: 999.0000 mL | INTRAVENOUS | Status: DC

## 2011-06-13 NOTE — ED Provider Notes (Signed)
History     CSN: [id number] Arrival date & time: 06/13/2011  8:53 PM  Chief Complaint  Patient presents with  . Chest Pain   HPI Pt was seen at 2130.  Per pt, c/o gradual onset and persistence of multiple intermittent episodes of mid-sternal chest "pain" x3 days.  Describes the CP as "pressure," "heaviness," and "like when I needed a stent last time."  CP has been assoc with SOB and nausea, has radiated into his left shoulder and jaw/neck area.  Has been relieved by taking ntg SL.  CP has been occurring at rest and on exertion.  Denies palpitations, no cough, no back pain (other than his usual longstanding chronic low back pain), no vomiting/diarrhea, no abd pain.        Past Medical History  Diagnosis Date  . Hypertension   . Coronary artery disease   . Myocardial infarction   . Chronic back pain   . Hypercholesteremia   . GERD (gastroesophageal reflux disease)     Past Surgical History  Procedure Date  . Coronary angioplasty with stent placement   . Back surgery     No family history on file.  History  Substance Use Topics  . Smoking status: Not on file  . Smokeless tobacco: Not on file  . Alcohol Use:     Review of Systems ROS: Statement: All systems negative except as marked or noted in the HPI; Constitutional: Negative for fever and chills. ; ; Eyes: Negative for eye pain and discharge. ; ; ENMT: Negative for ear pain, hoarseness, nasal congestion, sinus pressure and sore throat. ; ; Cardiovascular: Negative for palpitations, diaphoresis, peripheral edema. +CP and SOB ; Respiratory: Negative for cough, wheezing and stridor. ; ; Gastrointestinal: Negative for nausea, vomiting, diarrhea and abdominal pain. ; ; Genitourinary: Negative for dysuria, flank pain and hematuria. ; ; Musculoskeletal: Negative for back pain and neck pain. ; ; Skin: Negative for rash and skin lesion. ; ; Neuro: Negative for headache, lightheadedness and neck stiffness.    Physical Exam  BP 142/82   Pulse 69  Temp(Src) 97.8 F (36.6 C) (Oral)  Resp 20  Ht 5\' 11"  (1.803 m)  Wt 237 lb (107.502 kg)  BMI 33.05 kg/m2  SpO2 99%  Physical Exam 2135: Physical examination:  Nursing notes reviewed; Vital signs and O2 SAT reviewed;  Constitutional: Well developed, Well nourished, Well hydrated, In no acute distress; Head:  Normocephalic, atraumatic; Eyes: EOMI, PERRL, No scleral icterus; ENMT: Mouth and pharynx normal, Mucous membranes moist; Neck: Supple, Full range of motion, No lymphadenopathy; Cardiovascular: Regular rate and rhythm, No murmur, rub, or gallop; Respiratory: Breath sounds clear & equal bilaterally, No rales, rhonchi, wheezes, or rub, Normal respiratory effort/excursion; Chest: Nontender, Movement normal; Abdomen: Soft, Nontender, Nondistended, Normal bowel sounds; Extremities: Pulses normal, No tenderness, No edema, No calf edema or asymmetry.; Neuro: AA&Ox3, Major CN grossly intact.  No gross focal motor or sensory deficits in extremities.; Skin: Color normal, Warm, Dry.   ED Course  Procedures  MDM MDM Reviewed: nursing note, vitals and previous chart Reviewed previous: ECG and labs Interpretation: labs, ECG and x-ray   "09/2002 Cardiac Cath by Dr. Chales Abrahams: FINDINGS:  1. Left main trunk:  Mild irregularities.  2. LAD:  This is a medium caliber vessel that provides a several small diagonal  branches.  The LAD has mild plaque of 40 to 50% in the proximal segment.  The distal vessel has mild diffuse disease of 30%.  The diagonal branch is  trivial in size.  3. Left circumflex artery:  This is a large caliber vessel that provides a large bifurcating first marginal branch in the mid section and a small second marginal branch distally.  The proximal A-V circumflex has mild diffuse disease of 30.  The first marginal branch has moderate disease of 40 to 50% prior to the bifurcation.  The stent in the inferior position is widely patent with mild in-stent restenosis of 30 to 40%.  4.  Right coronary artery:  This is dominant.  It is a small caliber vessel that provides a small posterior descending artery at its terminal segment.  The right coronary artery has moderate disease of 30% in     proximal and mid section.  There is then a tubular narrowing of 50% at the junction of the mid and distal segments.  The distal right coronary artery has evidence of previously placed Cypher stents that are widely patent.  There is mild disease of 30% immediately prior to and following the stented segment.  The proximal posterior descending artery is patent with mild disease of 30% on the inferior border in the area of previous dissections, appears to be well healed.  5. Left ventricle:  Normal end-systolic and end-diastolic dimensions.  Overall left ventricular function is low-normal to mildly impaired with ejection fraction of approximately 45 to  50%.  There is hypokinesis of     the basal and mid inferior wall.  No mitral regurgitation was noted.  LV pressure is 160/20, aortic 160/90.  LV EDP equals 25."   Date: 06/13/2011  Rate: 75  Rhythm: normal sinus rhythm  QRS Axis: normal  Intervals: normal  ST/T Wave abnormalities: nonspecific ST/T changes  Conduction Disutrbances:none  Narrative Interpretation:   Old EKG Reviewed: unchanged from previous EKG dated 09/11/2009.   Results for orders placed during the hospital encounter of 06/13/11  PRO B NATRIURETIC PEPTIDE      Component Value Range   BNP, POC 306.8 (*) 0 - 125 (pg/mL)  CBC      Component Value Range   WBC 6.0  4.0 - 10.5 (K/uL)   RBC 4.23  4.22 - 5.81 (MIL/uL)   Hemoglobin 12.4 (*) 13.0 - 17.0 (g/dL)   HCT 16.1 (*) 09.6 - 52.0 (%)   MCV 87.7  78.0 - 100.0 (fL)   MCH 29.3  26.0 - 34.0 (pg)   MCHC 33.4  30.0 - 36.0 (g/dL)   RDW 04.5  40.9 - 81.1 (%)   Platelets 177  150 - 400 (K/uL)  CARDIAC PANEL(CRET KIN+CKTOT+MB+TROPI)      Component Value Range   Total CK 46  7 - 232 (U/L)   CK, MB 2.7  0.3 - 4.0 (ng/mL)    Troponin I <0.30  <0.30 (ng/mL)   Relative Index RELATIVE INDEX IS INVALID  0.0 - 2.5   COMPREHENSIVE METABOLIC PANEL      Component Value Range   Sodium 141  135 - 145 (mEq/L)   Potassium 3.1 (*) 3.5 - 5.1 (mEq/L)   Chloride 104  96 - 112 (mEq/L)   CO2 24  19 - 32 (mEq/L)   Glucose, Bld 101 (*) 70 - 99 (mg/dL)   BUN 10  6 - 23 (mg/dL)   Creatinine, Ser 9.14  0.50 - 1.35 (mg/dL)   Calcium 8.8  8.4 - 78.2 (mg/dL)   Total Protein 6.4  6.0 - 8.3 (g/dL)   Albumin 3.6  3.5 - 5.2 (g/dL)   AST 10  0 -  37 (U/L)   ALT 8  0 - 53 (U/L)   Alkaline Phosphatase 92  39 - 117 (U/L)   Total Bilirubin 0.2 (*) 0.3 - 1.2 (mg/dL)   GFR calc non Af Amer >60  >60 (mL/min)   GFR calc Af Amer >60  >60 (mL/min)   Dg Chest Portable 1 View  06/13/2011  *RADIOLOGY REPORT*  Clinical Data: Chest pain.  PORTABLE CHEST - 1 VIEW  Comparison: 06/19/2009  Findings: Cardiomegaly noted with coronary stents in place.  Incidental note is made of an azygos fissure.  The lungs appear clear.  No pleural effusion identified.  IMPRESSION:  1.  Cardiomegaly. 2.   Otherwise, no significant abnormality identified.  Original Report Authenticated By: Dellia Cloud, M.D.   2245:  BNP mildly elevated, no old to compare.   Mild hypokalemia, will replete potassium PO.  CK/troponin neg, EKG without acute STTW changes.  Feels improved after meds (ASA, ntg, morphine).  Dx testing d/w pt and family.  Questions answered.  Verb understanding, agreeable to xfer/admit.      11:50 PM:  T/C to Cards MD at [facility] Dr. Raphael Gibney ([facility] Fellow on call for Magdalena Cards), case discussed, including:  HPI, pertinent PM/SHx, VS/PE, dx testing, ED course and treatment.  Agreeable to accept transfer for admit.  Requests to admit to tele bed to Dr. Eden Emms (pt's usual Cards MD per pt and family).      Deegan Valentino Allison Quarry, DO 06/14/11 1433

## 2011-06-13 NOTE — ED Notes (Signed)
Chest pain for 3 days, previous history of heart disease and MI

## 2011-06-13 NOTE — ED Notes (Signed)
Pt resting  in bed stating his pain increased to a 4/10, no noted distress no stated needs

## 2011-06-13 NOTE — ED Notes (Signed)
Pt stating pain is better rating it at 3/10 after 1st nitro

## 2011-06-14 ENCOUNTER — Inpatient Hospital Stay (HOSPITAL_COMMUNITY)
Admission: RE | Admit: 2011-06-14 | Discharge: 2011-06-15 | DRG: 247 | Disposition: A | Discharge status: Home / Self Care | Payer: No Typology Code available for payment source | Source: Other Acute Inpatient Hospital | Attending: Cardiovascular Disease | Admitting: Cardiovascular Disease

## 2011-06-14 DIAGNOSIS — I2510 Atherosclerotic heart disease of native coronary artery without angina pectoris: Principal | ICD-10-CM | POA: Diagnosis present

## 2011-06-14 DIAGNOSIS — Z96659 Presence of unspecified artificial knee joint: Secondary | ICD-10-CM

## 2011-06-14 DIAGNOSIS — I252 Old myocardial infarction: Secondary | ICD-10-CM

## 2011-06-14 DIAGNOSIS — M549 Dorsalgia, unspecified: Secondary | ICD-10-CM | POA: Diagnosis present

## 2011-06-14 DIAGNOSIS — R079 Chest pain, unspecified: Secondary | ICD-10-CM

## 2011-06-14 DIAGNOSIS — Z9861 Coronary angioplasty status: Secondary | ICD-10-CM

## 2011-06-14 DIAGNOSIS — E785 Hyperlipidemia, unspecified: Secondary | ICD-10-CM | POA: Diagnosis present

## 2011-06-14 DIAGNOSIS — Z7902 Long term (current) use of antithrombotics/antiplatelets: Secondary | ICD-10-CM

## 2011-06-14 DIAGNOSIS — Z7982 Long term (current) use of aspirin: Secondary | ICD-10-CM

## 2011-06-14 DIAGNOSIS — I10 Essential (primary) hypertension: Secondary | ICD-10-CM | POA: Diagnosis present

## 2011-06-14 LAB — POCT ACTIVATED CLOTTING TIME: Activated Clotting Time: 501 seconds

## 2011-06-14 LAB — LIPID PANEL
Cholesterol: 218 mg/dL — ABNORMAL HIGH (ref 0–200)
Total CHOL/HDL Ratio: 7 RATIO
Triglycerides: 126 mg/dL (ref <150)
VLDL: 25 mg/dL (ref 0–40)

## 2011-06-14 LAB — BASIC METABOLIC PANEL
CO2: 27 mEq/L (ref 19–32)
Chloride: 109 mEq/L (ref 96–112)
Creatinine, Ser: 0.65 mg/dL (ref 0.50–1.35)
Sodium: 143 mEq/L (ref 135–145)

## 2011-06-14 LAB — PRO B NATRIURETIC PEPTIDE: Pro B Natriuretic peptide (BNP): 295.7 pg/mL — ABNORMAL HIGH (ref 0–125)

## 2011-06-14 LAB — URINALYSIS, ROUTINE W REFLEX MICROSCOPIC
Ketones, ur: NEGATIVE mg/dL
Leukocytes, UA: NEGATIVE
Nitrite: NEGATIVE
Specific Gravity, Urine: 1.02 (ref 1.005–1.030)
pH: 6.5 (ref 5.0–8.0)

## 2011-06-14 LAB — CARDIAC PANEL(CRET KIN+CKTOT+MB+TROPI): Total CK: 40 U/L (ref 7–232)

## 2011-06-14 MED ORDER — MORPHINE SULFATE 2 MG/ML IJ SOLN
2.0000 mg | Freq: Once | INTRAMUSCULAR | Status: AC
  Administered 2011-06-14: 2 mg via INTRAVENOUS
  Filled 2011-06-14: qty 1

## 2011-06-14 NOTE — ED Notes (Signed)
Pt leaving er via [facility] no noted distress no stated needs from pt or family. Report called to rose at receiving facility

## 2011-06-15 LAB — CBC
HCT: 31.9 % — ABNORMAL LOW (ref 39.0–52.0)
Hemoglobin: 10.4 g/dL — ABNORMAL LOW (ref 13.0–17.0)
WBC: 4.7 10*3/uL (ref 4.0–10.5)

## 2011-06-15 LAB — BASIC METABOLIC PANEL
BUN: 9 mg/dL (ref 6–23)
Chloride: 105 mEq/L (ref 96–112)
GFR calc Af Amer: >60 mL/min (ref >60)
Glucose, Bld: 92 mg/dL (ref 70–99)
Potassium: 4 mEq/L (ref 3.5–5.1)
Sodium: 137 mEq/L (ref 135–145)

## 2011-06-16 NOTE — Cardiovascular Report (Signed)
[name], FULTS NO.:  [id number]  MEDICAL RECORD NO.:  [id number]  LOCATION:  2919                         FACILITY:  [facility]  PHYSICIAN:  Arturo Morton. Riley Kill, MD, FACCDATE OF BIRTH:  1951-04-26  DATE OF PROCEDURE:  06/14/2011 DATE OF DISCHARGE:                           CARDIAC CATHETERIZATION   INDICATIONS:  [name] is a 60 year old gentleman who has been extensively treated here.  He has been followed by Dr. Eden Emms.  The patient has stents within the circumflex, right, and LAD.  He has had some recurrent symptoms, and he was seen by Dr. Eden Emms, his primary cardiologist, who recommended diagnostic cardiac catheterization.  We discussed the risks and benefits.  The patient was agreeable to proceed.  PROCEDURES: 1. Left heart catheterization. 2. Selective coronary arteriography. 3. Selective left ventriculography. 4. Placement of drug-eluting stent in the circumflex marginal.  DESCRIPTION OF PROCEDURE:  The patient was brought to the catheterization laboratory and prepped and draped in the usual fashion. We utilized the right radial artery and a 5-French sheath was placed.  3 mg of intra-arterial verapamil and 5000 units of intravenous heparin were administered.  Views of the right and left coronary arteries were obtained in multiple angiographic projections.  Central aortic and left ventricular pressures were measured with pigtail.  Ventriculography was performed in the RAO projection.  I then carefully reviewed the films, and carefully reviewed the prior procedural notes to follow the path. The patient's LAD stent remains patent as to the distal right stent. His previously placed non drug-eluting stent, or NIR Royal in the circumflex coronary artery, which has been re-dilated previously demonstrates extensive area of in-stent restenosis.  I discussed the various options with the patient, and given the findings, we elected to recommend an attempt at  reopening the vessel.  He has a modest-size vessel beyond this with no evident collaterals.  The patient was agreeable to proceed.  We then administered Angiomax and an ACT was checked.  I have spoke with the patient's wife as we were getting ready. We used an XB 3.5 guide, which sat nicely in the left main.  We were able to get across with a Prowater wire, and dilatations were done with a 2.25 x 30-mm balloon.  Given the angiographic findings, we felt that the best option at this point given the very, very long area would be to place a drug-eluting stent.  Therefore, a 2.5 x 38 Promus element drug- eluting stent was attempted to be passed into the vessel.  We initially could not, so a second wire, specifically a BMW was placed down the vessel and this acted like a nice rails, so the stent could be easily delivered.  The BMW wire was removed and the stent was carefully positioned so that some of it was at the proximal end and some out at the distal end.  Following this, the stent was deployed at about 15 atmospheres.  There was marked improvement in the appearance of the vessel.  We then postdilated throughout a high pressure using a 2.75-mm noncompliant balloon.  Most proximal end of the stent was dilated with a 3. 0 x 8 noncompliant balloon to high  pressure to flare at the ostium. There were no major complications.  The patient was taken to the holding area after removal of the radial sheath and placement of a TR band.  I took his family to the viewing room and reviewed the findings with his wife and brother-in-law.  He tolerated the procedure well and there were no major complications.  HEMODYNAMIC DATA: 1. The central aortic pressure is 116/73, mean 95. 2. LV pressure 123/20. 3. No gradient or pullback across the aortic valve.  ANGIOGRAPHIC DATA: 1. Ventriculography in the RAO projection reveals a small inferobasal     focal wall motion abnormality with moderately severe  hypokinesis.     Overall EF would be estimated in the 50% range. 2. The left main is free of critical disease. 3. The LAD is a heavily calcified vessel with diffuse luminal     irregularities throughout.  In the previously placed stent, there     is about 40% narrowing.  There was extensive calcification noted     proximally.  There was a fair amount of luminal irregularity again     noted distally.  There was about 50% distal stenosis noted as well.     Critical disease however is not noted. 4. The circumflex provides a ramus intermedius.  This has mild luminal     irregularity.  Following this, there is a bifurcating marginal     branch with a superior branch and an inferior branch.  The superior     branch has diffuse 70% narrowing throughout the proximal vessel     over a long area.  The inferior branch has a previously placed NIR     Royal stent.  There was extensive in-stent restenosis, which was     successfully dilated with an excellent angiographic appearance.     This was improved. 5. The right coronary artery is also a calcified, diffusely diseased     vessel.  Throughout the proximal and midvessel were several areas     of probably 40% narrowing with diffuse luminal irregularities and     calcification.  The previously placed stent distally has about 30%     in-stent renarrowing with one focal area of about 40 to no more     than 50, but there is good runoff into the distal vessel.  It     consists mainly of a posterior descending branch.  There is a right     ventricular branch that comes off proximally with tandem 90%     stenoses, but is relatively small in caliber.  CONCLUSIONS: 1. Minimal reduction in overall left ventricular systolic function. 2. Continued patency of the stent in the left anterior descending     artery with a diffuse calcified artery. 3. High-grade in-stent restenosis in the inferior subbranch of the     large bifurcating marginal with successful  percutaneous stenting     using a drug-eluting platform. 4. Diffuse abnormalities of the right coronary artery as described     above.  DISPOSITION:  Aggressive medical therapy will be warranted.  He should remain on dual antiplatelet therapy.  We will get a P2Y12 given the length of the stent to ensure that he has adequate platelet protection.     Arturo Morton. Riley Kill, MD, [facility]     TDS/MEDQ  D:  06/14/2011  T:  06/15/2011  Job:  [id number]  cc:   CV Laboratory Noralyn Pick. Eden Emms, MD, [facility]  Electronically Signed by Shawnie Pons MD  FACC on 06/16/2011 09:54:28 AM

## 2011-06-20 ENCOUNTER — Encounter: Payer: Self-pay | Admitting: Cardiovascular Disease

## 2011-06-21 NOTE — Discharge Summary (Addendum)
[name], TEAL NO.:  [id number]  MEDICAL RECORD NO.:  [id number]  LOCATION:  2919                         FACILITY:  [facility]  PHYSICIAN:  Noralyn Pick. Eden Emms, MD, FACCDATE OF BIRTH:  06-Feb-1951  DATE OF ADMISSION:  06/14/2011 DATE OF DISCHARGE:  06/15/2011                              DISCHARGE SUMMARY   DISCHARGE DIAGNOSES: 1. Coronary artery disease.     a.     Status post Promus drug-eluting stent placement for high-      grade in-stent restenosis in the inferior subbranch of the large      bifurcating marginal, June 14, 2011, with continued patency of      the stent to the left anterior descending with diffusely calcified      artery.     b.     Status post percutaneous coronary intervention in 2003 by      Dr. Chales Abrahams with stents to the mid left anterior descending.     c.     P2Y12 testing this admission demonstrating a PRU of 330, the      patient changed to Effient. 2. Hypertension with hypotension with admission, ACE inhibitor and     Lasix on hold. 3. Hyperlipidemia with LDL 162, initiated on Zocor this admission. 4. Chronic back pain, on chronic narcotics. 5. Elevated TSH, for recheck with PCP within the week.  HOSPITAL COURSE:  [name] is a 60 year old gentleman with a history of known coronary artery disease, hypertension, hyperlipidemia, and chronic back pain who presents to the [facility], complains of left- sided substernal squeezing chest pressure with associated left shoulder pain.  He had mild nausea and some shortness of breath.  He reported similar prior episodes of chest pain when he required stenting.  He took nitroglycerin with relief and the pain returns, so he had to take more nitroglycerin.  He initially presented to [facility] and transferred to [facility] for further evaluation.  Cardiac enzymes were negative x2.  The patient was continued on aspirin and Plavix and was felt to warrant cardiac  catheterization which was done on June 14, 2011 by Dr. Riley Kill.  This demonstrated high-grade in-stent restenosis in the inferior subbranch of the large bifurcating marginal with unsuccessful PCI using drug-eluting stent platform.  He also continued patency of the stent to the LAD with diffusely calcified artery as well as diffuse abnormalities of RCA and EF was 50%.  P2Y12 testing was obtained given the length of the stent, which did demonstrate a PRU of 330.  This was reviewed by Dr. Eden Emms and was changed to Effient.  Per discussion with Dr. Eden Emms since the patient had been on Plavix and aspirin prior to admission, the patient was started on daily dosing at 10 mg daily of Effient.  Today, the patient is doing well.  Dr. Eden Emms has seen and examined him today and feels he is stable for discharge.  The patient's blood pressure on the day of discharge is slightly soft, in the upper 80s and mid 90s and the patient is asymptomatic.  His ACE inhibitor and Lasix will be discontinued per discussion with Dr. Eden Emms.  Of  note, two pertinent labs and studies during this admission demonstrated an LDL of 162 and a TSH of 15.646.  He was subsequently started on a statin.  He will be strongly encouraged to follow up with his primary care provider as an outpatient for recheck of his thyroid function tests along with the free T4.  DISCHARGE LABS:  WBC 4.7, hemoglobin 10.4, hematocrit 31.9, platelet count 143.  Sodium 137, potassium 4, chloride 105, CO2 29, glucose 92, BUN 9, creatinine 0.65.  Cardiac enzymes are negative x3.  Pro BNP 295. Total cholesterol 280, triglycerides 126, HDL 31, LDL 162, TSH 15.646. UA was negative.  STUDIES: 1. Chest x-ray, June 13, 2011, showed cardiomegaly, is otherwise no     significant abnormality identified. 2. Cardiac catheterization, June 14, 2011, please see full report     for details as well as HPI for summary.  DISCHARGE MEDICATIONS: 1. Prasugrel  10 mg daily. 2. Zocor 40 mg nightly. 3. Aspirin 81 mg daily. 4. Celexa 20 mg daily. 5. Doxazosin 4 mg daily. 6. Kadian 60mg  t.i.d. 7. Morphine sulfate 15 mg t.i.d. 8. Multivitamin 1 tablet daily. 9. Nadolol 40 mg daily. 10.Nitroglycerin sublingual 0.4 mg every 5 minutes as needed up to 3     doses for chest pain. 11.Omeprazole 20 mg daily. 12.Zyrtec 20 mg daily.  Please note, the patient's Plavix was changed to Effient this admission secondary to inadequate PRU by P2Y12 testing.  Lasix and quinapril were discontinued at this time secondary to hypotension with blood pressures ranging in the upper 80s to mid 90s.  DISPOSITION:  [name] will be discharged in stable condition to home. He is not to lift anything over 5 pounds for 1 week, drive 2 days or participate in sexual activity for 1 week.  He is to follow a low- sodium, heart-healthy die and to call or return if he notices any pain, swelling, bleeding, or pus from the cath site.  He does not have any appointment for the next 5 weeks in [place], so the patient will follow up in [place] office on July 06, 2011, at 9:30 a.m.  He is also ensure to follow up with his primary care provider regarding recheck of his thyroid function testing within 1 week.  DURATION OF DISCHARGE ENCOUNTER:  Greater than 30 minutes including physician and PA time.     [name], P.A.C.   ______________________________ Noralyn Pick Eden Emms, MD, [facility]    DD/MEDQ  D:  06/15/2011  T:  06/15/2011  Job:  [id number]  Electronically Signed by Charlton Haws MD [facility] on 06/21/2011 09:23:49 AM Electronically Signed by Ronie Spies  on 06/22/2011 10:00:36 AM

## 2011-06-30 ENCOUNTER — Encounter: Payer: Self-pay | Admitting: Cardiovascular Disease

## 2011-07-06 ENCOUNTER — Ambulatory Visit (INDEPENDENT_AMBULATORY_CARE_PROVIDER_SITE_OTHER): Payer: No Typology Code available for payment source | Admitting: Cardiovascular Disease

## 2011-07-06 ENCOUNTER — Encounter: Payer: Self-pay | Admitting: Cardiovascular Disease

## 2011-07-06 DIAGNOSIS — E782 Mixed hyperlipidemia: Secondary | ICD-10-CM | POA: Insufficient documentation

## 2011-07-06 DIAGNOSIS — Z8679 Personal history of other diseases of the circulatory system: Secondary | ICD-10-CM

## 2011-07-06 DIAGNOSIS — I25119 Atherosclerotic heart disease of native coronary artery with unspecified angina pectoris: Secondary | ICD-10-CM | POA: Insufficient documentation

## 2011-07-06 DIAGNOSIS — M549 Dorsalgia, unspecified: Secondary | ICD-10-CM

## 2011-07-06 DIAGNOSIS — I2510 Atherosclerotic heart disease of native coronary artery without angina pectoris: Secondary | ICD-10-CM

## 2011-07-06 NOTE — Patient Instructions (Signed)
Your physician recommends that you schedule a follow-up appointment in: 6 MONTHS WITH DR NISHAN  Your physician recommends that you continue on your current medications as directed. Please refer to the Current Medication list given to you today. 

## 2011-07-06 NOTE — Assessment & Plan Note (Signed)
F/U with ortho and [facility].  No nerve stimulator for at least 6 months

## 2011-07-06 NOTE — Progress Notes (Signed)
F/U after recent D/C from [facility] for angina:  1. Coronary artery disease.  a. Status post Promus drug-eluting stent placement for high-  grade in-stent restenosis in the inferior subbranch of the large  bifurcating marginal, June 14, 2011, with continued patency of  the stent to the left anterior descending with diffusely calcified  artery.  b. Status post percutaneous coronary intervention in 2003 by  Dr. Chales Abrahams with stents to the mid left anterior descending.  c. P2Y12 testing this admission demonstrating a PRU of 330, the  patient changed to Effient.  He has had better energy and no chest pain since D/C.  Needs nerve stimulator for back.  Told him he could not stop his Effient for at least 6 months.  Lasix and qunipril not restarted after D/C and BP is ok.    ROS: Denies fever, malais, weight loss, blurry vision, decreased visual acuity, cough, sputum, SOB, hemoptysis, pleuritic pain, palpitaitons, heartburn, abdominal pain, melena, lower extremity edema, claudication, or rash.  All other systems reviewed and negative  General: Affect appropriate Healthy:  appears stated age HEENT: normal Neck supple with no adenopathy JVP normal no bruits no thyromegaly Lungs clear with no wheezing and good diaphragmatic motion Heart:  S1/S2 no murmur,rub, gallop or click PMI normal Abdomen: benighn, BS positve, no tenderness, no AAA no bruit.  No HSM or HJR Distal pulses intact with no bruits No edema Neuro non-focal Skin warm and dry No muscular weakness   Current Outpatient Prescriptions  Medication Sig Dispense Refill  . aspirin 81 MG tablet Take 81 mg by mouth daily.        . cetirizine (ZYRTEC) 10 MG tablet Take 10 mg by mouth at bedtime.        . citalopram (CELEXA) 20 MG tablet Take 10 mg by mouth every morning.        Marland Kitchen doxazosin (CARDURA) 4 MG tablet Take 4 mg by mouth every morning.        Marland Kitchen levothyroxine (SYNTHROID, LEVOTHROID) 50 MCG tablet Take 50 mcg by mouth daily.          Marland Kitchen morphine (KADIAN) 60 MG 24 hr capsule Take 60 mg by mouth every 8 (eight) hours. With Morphine 15mg   For a total of 75mg  every 8 hours       . morphine (MSIR) 15 MG tablet Take 15 mg by mouth every 8 (eight) hours. For a total of 75mg  every 8 hours       . Multiple Vitamin (MULTIVITAMIN) capsule Take 1 capsule by mouth daily.        . nadolol (CORGARD) 40 MG tablet Take 40 mg by mouth at bedtime.        Marland Kitchen omeprazole (PRILOSEC) 20 MG capsule Take 20 mg by mouth 2 (two) times daily.        . prasugrel (EFFIENT) 10 MG TABS Take 10 mg by mouth.        . simvastatin (ZOCOR) 40 MG tablet Take 40 mg by mouth at bedtime.          Allergies  Penicillins and Sulfa antibiotics  Electrocardiogram:  Assessment and Plan

## 2011-07-06 NOTE — Assessment & Plan Note (Signed)
Just started stain in hospital.  F/U LDL 6 months No myalgias or side effects.  F/U  LFT's in 6 months. Lab Results  Component Value Date   LDLCALC 162* 06/14/2011

## 2011-07-06 NOTE — Assessment & Plan Note (Signed)
Well controlled.  Continue current medications and low sodium Dash type diet.    

## 2011-07-06 NOTE — Assessment & Plan Note (Signed)
Previous stent ot LAD with recent OM DES.  Continue beta blocker ASA and Effient

## 2011-07-27 LAB — BASIC METABOLIC PANEL
BUN: 8
Chloride: 101
Potassium: 4.4
Sodium: 141

## 2011-07-27 LAB — CBC
HCT: 28.8 — ABNORMAL LOW
HCT: 36.6 — ABNORMAL LOW
Hemoglobin: 12.3 — ABNORMAL LOW
Hemoglobin: 9.7 — ABNORMAL LOW
MCHC: 33.6
MCV: 90.5
MCV: 91.2
Platelets: 141 — ABNORMAL LOW
Platelets: 164
RDW: 14.4
WBC: 5

## 2011-08-01 LAB — CBC
HCT: 30 — ABNORMAL LOW
Hemoglobin: 10.3 — ABNORMAL LOW
Hemoglobin: 8 — ABNORMAL LOW
Hemoglobin: 8.1 — ABNORMAL LOW
Hemoglobin: 8.3 — ABNORMAL LOW
Hemoglobin: 9.2 — ABNORMAL LOW
MCHC: 34.1
MCHC: 34.1
MCHC: 34.3
MCHC: 34.5
MCHC: 34.5
MCHC: 34.5
MCV: 89.1
MCV: 89.7
MCV: 89.8
Platelets: 130 — ABNORMAL LOW
Platelets: 176
Platelets: 193
RBC: 2.23 — ABNORMAL LOW
RBC: 2.59 — ABNORMAL LOW
RBC: 2.68 — ABNORMAL LOW
RDW: 15.3
RDW: 15.6 — ABNORMAL HIGH
RDW: 15.7 — ABNORMAL HIGH
RDW: 15.7 — ABNORMAL HIGH
RDW: 16.2 — ABNORMAL HIGH
RDW: 16.3 — ABNORMAL HIGH
RDW: 16.5 — ABNORMAL HIGH

## 2011-08-01 LAB — DIFFERENTIAL
Basophils Absolute: 0
Basophils Absolute: 0
Basophils Absolute: 0
Basophils Absolute: 0
Basophils Absolute: 0
Basophils Absolute: 0
Basophils Relative: 0
Basophils Relative: 0
Basophils Relative: 0
Basophils Relative: 0
Basophils Relative: 0
Basophils Relative: 1
Eosinophils Absolute: 0.1
Eosinophils Absolute: 0.1
Eosinophils Absolute: 0.2
Eosinophils Absolute: 0.2
Eosinophils Absolute: 0.3
Eosinophils Relative: 3
Eosinophils Relative: 4
Eosinophils Relative: 4
Lymphocytes Relative: 11 — ABNORMAL LOW
Lymphocytes Relative: 9 — ABNORMAL LOW
Monocytes Absolute: 0.5
Monocytes Absolute: 0.5
Monocytes Absolute: 0.7
Monocytes Absolute: 0.7
Monocytes Absolute: 0.7
Monocytes Relative: 10
Monocytes Relative: 12
Monocytes Relative: 5
Neutro Abs: 3.5
Neutro Abs: 3.9
Neutro Abs: 6.2
Neutro Abs: 6.6
Neutrophils Relative %: 75
Neutrophils Relative %: 79 — ABNORMAL HIGH
Neutrophils Relative %: 80 — ABNORMAL HIGH
Neutrophils Relative %: 81 — ABNORMAL HIGH

## 2011-08-01 LAB — BASIC METABOLIC PANEL
BUN: 11
BUN: 12
BUN: 5 — ABNORMAL LOW
BUN: 7
BUN: 9
CO2: 28
CO2: 28
CO2: 29
CO2: 29
CO2: 29
CO2: 30
Calcium: 7.9 — ABNORMAL LOW
Calcium: 7.9 — ABNORMAL LOW
Calcium: 8 — ABNORMAL LOW
Calcium: 8.1 — ABNORMAL LOW
Calcium: 8.1 — ABNORMAL LOW
Calcium: 8.1 — ABNORMAL LOW
Calcium: 9.5
Chloride: 101
Chloride: 104
Chloride: 105
Creatinine, Ser: 0.83
Creatinine, Ser: 0.88
Creatinine, Ser: 0.9
Creatinine, Ser: 0.98
Creatinine, Ser: 1
GFR calc Af Amer: >60
GFR calc Af Amer: >60
GFR calc Af Amer: >60
GFR calc non Af Amer: >60
GFR calc non Af Amer: >60
Glucose, Bld: 110 — ABNORMAL HIGH
Glucose, Bld: 114 — ABNORMAL HIGH
Glucose, Bld: 120 — ABNORMAL HIGH
Glucose, Bld: 130 — ABNORMAL HIGH
Glucose, Bld: 130 — ABNORMAL HIGH
Glucose, Bld: 98
Potassium: 3.7
Sodium: 135
Sodium: 136
Sodium: 138
Sodium: 138

## 2011-08-01 LAB — PROTIME-INR
INR: 1.1
INR: 1.1
INR: 2.7 — ABNORMAL HIGH
INR: 2.8 — ABNORMAL HIGH
Prothrombin Time: 15.8 — ABNORMAL HIGH
Prothrombin Time: 21.5 — ABNORMAL HIGH

## 2011-08-01 LAB — CROSSMATCH
ABO/RH(D): O POS
Antibody Screen: NEGATIVE

## 2011-08-01 LAB — URINALYSIS, ROUTINE W REFLEX MICROSCOPIC
Nitrite: NEGATIVE
Specific Gravity, Urine: 1.01
Urobilinogen, UA: 0.2

## 2011-08-01 LAB — PREPARE RBC (CROSSMATCH)

## 2011-08-01 LAB — URINE CULTURE: Culture: NO GROWTH

## 2011-08-01 LAB — CULTURE, BLOOD (ROUTINE X 2)
Culture: NO GROWTH
Report Status: 10132009

## 2011-08-01 LAB — OCCULT BLOOD X 1 CARD TO LAB, STOOL: Fecal Occult Bld: NEGATIVE

## 2011-08-01 LAB — HEMOGLOBIN AND HEMATOCRIT, BLOOD
Hemoglobin: 8.7 — ABNORMAL LOW
Hemoglobin: 9.1 — ABNORMAL LOW

## 2011-08-01 LAB — APTT: aPTT: 36

## 2011-10-31 ENCOUNTER — Ambulatory Visit (HOSPITAL_COMMUNITY)
Admission: RE | Admit: 2011-10-31 | Discharge: 2011-10-31 | Disposition: A | Discharge status: Home / Self Care | Payer: No Typology Code available for payment source | Source: Ambulatory Visit | Attending: Family Medicine | Admitting: Family Medicine

## 2011-10-31 ENCOUNTER — Other Ambulatory Visit: Payer: Self-pay | Admitting: Family Medicine

## 2011-10-31 DIAGNOSIS — M25579 Pain in unspecified ankle and joints of unspecified foot: Secondary | ICD-10-CM | POA: Insufficient documentation

## 2011-10-31 DIAGNOSIS — T148XXA Other injury of unspecified body region, initial encounter: Secondary | ICD-10-CM

## 2011-10-31 DIAGNOSIS — R52 Pain, unspecified: Secondary | ICD-10-CM

## 2011-10-31 DIAGNOSIS — S9030XA Contusion of unspecified foot, initial encounter: Secondary | ICD-10-CM | POA: Insufficient documentation

## 2011-10-31 DIAGNOSIS — X58XXXA Exposure to other specified factors, initial encounter: Secondary | ICD-10-CM | POA: Insufficient documentation

## 2011-11-01 HISTORY — PX: EYE SURGERY: SHX253

## 2011-12-31 ENCOUNTER — Emergency Department (HOSPITAL_COMMUNITY): Payer: No Typology Code available for payment source

## 2011-12-31 ENCOUNTER — Observation Stay (HOSPITAL_COMMUNITY)
Admission: EM | Admit: 2011-12-31 | Discharge: 2012-01-03 | Disposition: A | Discharge status: Home / Self Care | Payer: No Typology Code available for payment source | Attending: Cardiology | Admitting: Cardiology

## 2011-12-31 ENCOUNTER — Other Ambulatory Visit: Payer: Self-pay

## 2011-12-31 ENCOUNTER — Encounter (HOSPITAL_COMMUNITY): Payer: Self-pay | Admitting: *Deleted

## 2011-12-31 DIAGNOSIS — M1990 Unspecified osteoarthritis, unspecified site: Secondary | ICD-10-CM | POA: Insufficient documentation

## 2011-12-31 DIAGNOSIS — R0609 Other forms of dyspnea: Secondary | ICD-10-CM | POA: Insufficient documentation

## 2011-12-31 DIAGNOSIS — R0600 Dyspnea, unspecified: Secondary | ICD-10-CM

## 2011-12-31 DIAGNOSIS — Z9861 Coronary angioplasty status: Secondary | ICD-10-CM | POA: Insufficient documentation

## 2011-12-31 DIAGNOSIS — R079 Chest pain, unspecified: Secondary | ICD-10-CM | POA: Insufficient documentation

## 2011-12-31 DIAGNOSIS — I2510 Atherosclerotic heart disease of native coronary artery without angina pectoris: Secondary | ICD-10-CM | POA: Insufficient documentation

## 2011-12-31 DIAGNOSIS — R7989 Other specified abnormal findings of blood chemistry: Secondary | ICD-10-CM

## 2011-12-31 DIAGNOSIS — E785 Hyperlipidemia, unspecified: Secondary | ICD-10-CM | POA: Insufficient documentation

## 2011-12-31 DIAGNOSIS — I10 Essential (primary) hypertension: Secondary | ICD-10-CM | POA: Insufficient documentation

## 2011-12-31 DIAGNOSIS — R002 Palpitations: Principal | ICD-10-CM | POA: Insufficient documentation

## 2011-12-31 DIAGNOSIS — R0989 Other specified symptoms and signs involving the circulatory and respiratory systems: Secondary | ICD-10-CM | POA: Insufficient documentation

## 2011-12-31 DIAGNOSIS — E039 Hypothyroidism, unspecified: Secondary | ICD-10-CM | POA: Insufficient documentation

## 2011-12-31 LAB — CBC
Hemoglobin: 13.7 g/dL (ref 13.0–17.0)
MCHC: 33.8 g/dL (ref 30.0–36.0)
Platelets: 182 10*3/uL (ref 150–400)
RDW: 13.9 % (ref 11.5–15.5)

## 2011-12-31 LAB — COMPREHENSIVE METABOLIC PANEL
ALT: 9 U/L (ref 0–53)
Albumin: 4.3 g/dL (ref 3.5–5.2)
Alkaline Phosphatase: 80 U/L (ref 39–117)
Potassium: 3 mEq/L — ABNORMAL LOW (ref 3.5–5.1)
Sodium: 137 mEq/L (ref 135–145)
Total Protein: 7.2 g/dL (ref 6.0–8.3)

## 2011-12-31 LAB — POCT I-STAT TROPONIN I: Troponin i, poc: 0 ng/mL (ref 0.00–0.08)

## 2011-12-31 LAB — DIFFERENTIAL
Basophils Absolute: 0 10*3/uL (ref 0.0–0.1)
Basophils Relative: 0 % (ref 0–1)
Neutro Abs: 6.8 10*3/uL (ref 1.7–7.7)
Neutrophils Relative %: 74 % (ref 43–77)

## 2011-12-31 NOTE — ED Notes (Signed)
Cp x a hour with SOB, denies cough, denies N/V, " skipping a beat", hx of 4 stents

## 2012-01-01 ENCOUNTER — Emergency Department (HOSPITAL_COMMUNITY): Payer: No Typology Code available for payment source

## 2012-01-01 ENCOUNTER — Encounter (HOSPITAL_COMMUNITY): Payer: Self-pay | Admitting: *Deleted

## 2012-01-01 DIAGNOSIS — R079 Chest pain, unspecified: Secondary | ICD-10-CM

## 2012-01-01 DIAGNOSIS — R002 Palpitations: Secondary | ICD-10-CM

## 2012-01-01 LAB — CREATININE, SERUM
Creatinine, Ser: 0.77 mg/dL (ref 0.50–1.35)
GFR calc Af Amer: >90 mL/min
GFR calc non Af Amer: >90 mL/min

## 2012-01-01 LAB — CARDIAC PANEL(CRET KIN+CKTOT+MB+TROPI)
CK, MB: 2.5 ng/mL (ref 0.3–4.0)
CK, MB: 1.9 ng/mL (ref 0.3–4.0)
Relative Index: INVALID (ref 0.0–2.5)
Total CK: 47 U/L (ref 7–232)
Troponin I: <0.3 ng/mL
Troponin I: <0.3 ng/mL (ref <0.30)

## 2012-01-01 LAB — CBC
MCH: 28.3 pg (ref 26.0–34.0)
MCV: 84.4 fL (ref 78.0–100.0)
Platelets: 155 10*3/uL (ref 150–400)
RDW: 14 % (ref 11.5–15.5)

## 2012-01-01 LAB — POCT I-STAT TROPONIN I: Troponin i, poc: 0.01 ng/mL (ref 0.00–0.08)

## 2012-01-01 MED ORDER — ASPIRIN 300 MG RE SUPP
300.0000 mg | RECTAL | Status: AC
  Filled 2012-01-01: qty 1

## 2012-01-01 MED ORDER — PANTOPRAZOLE SODIUM 40 MG PO TBEC
40.0000 mg | DELAYED_RELEASE_TABLET | Freq: Every day | ORAL | Status: DC
  Administered 2012-01-01 – 2012-01-02 (×2): 40 mg via ORAL
  Filled 2012-01-01 (×2): qty 1

## 2012-01-01 MED ORDER — POTASSIUM CHLORIDE CRYS ER 20 MEQ PO TBCR
20.0000 meq | EXTENDED_RELEASE_TABLET | Freq: Every day | ORAL | Status: DC
  Administered 2012-01-01 – 2012-01-03 (×3): 20 meq via ORAL
  Filled 2012-01-01 (×3): qty 1

## 2012-01-01 MED ORDER — POTASSIUM CHLORIDE CRYS ER 20 MEQ PO TBCR: 20.0000 meq | EXTENDED_RELEASE_TABLET | Freq: Two times a day (BID) | ORAL | Status: DC

## 2012-01-01 MED ORDER — ASPIRIN EC 81 MG PO TBEC: 81.0000 mg | DELAYED_RELEASE_TABLET | Freq: Every day | ORAL | Status: DC

## 2012-01-01 MED ORDER — CITALOPRAM HYDROBROMIDE 10 MG PO TABS
10.0000 mg | ORAL_TABLET | Freq: Every day | ORAL | Status: DC
  Administered 2012-01-01 – 2012-01-03 (×3): 10 mg via ORAL
  Filled 2012-01-01 (×3): qty 1

## 2012-01-01 MED ORDER — NITROGLYCERIN 0.4 MG SL SUBL
0.4000 mg | SUBLINGUAL_TABLET | Freq: Once | SUBLINGUAL | Status: AC
  Administered 2012-01-01: 0.4 mg via SUBLINGUAL
  Filled 2012-01-01: qty 25

## 2012-01-01 MED ORDER — PRASUGREL HCL 10 MG PO TABS
10.0000 mg | ORAL_TABLET | Freq: Every day | ORAL | Status: DC
  Administered 2012-01-01 – 2012-01-03 (×3): 10 mg via ORAL
  Filled 2012-01-01 (×3): qty 1

## 2012-01-01 MED ORDER — MORPHINE SULFATE 30 MG PO TABS
15.0000 mg | ORAL_TABLET | Freq: Three times a day (TID) | ORAL | Status: DC
  Administered 2012-01-01 – 2012-01-03 (×7): 15 mg via ORAL
  Filled 2012-01-01 (×7): qty 1

## 2012-01-01 MED ORDER — POTASSIUM CHLORIDE CRYS ER 20 MEQ PO TBCR
40.0000 meq | EXTENDED_RELEASE_TABLET | Freq: Once | ORAL | Status: AC
  Administered 2012-01-01: 40 meq via ORAL
  Filled 2012-01-01: qty 2

## 2012-01-01 MED ORDER — MORPHINE SULFATE CR 30 MG PO TB12
30.0000 mg | ORAL_TABLET | Freq: Two times a day (BID) | ORAL | Status: DC
  Administered 2012-01-01 – 2012-01-03 (×5): 30 mg via ORAL
  Filled 2012-01-01 (×6): qty 1

## 2012-01-01 MED ORDER — SIMVASTATIN 40 MG PO TABS
40.0000 mg | ORAL_TABLET | Freq: Every day | ORAL | Status: DC
  Administered 2012-01-01 – 2012-01-02 (×2): 40 mg via ORAL
  Filled 2012-01-01 (×3): qty 1

## 2012-01-01 MED ORDER — ONDANSETRON HCL 4 MG/2ML IJ SOLN: 4.0000 mg | Freq: Four times a day (QID) | INTRAMUSCULAR | Status: DC | PRN

## 2012-01-01 MED ORDER — IOHEXOL 350 MG/ML SOLN
100.0000 mL | Freq: Once | INTRAVENOUS | Status: AC | PRN
  Administered 2012-01-01: 100 mL via INTRAVENOUS

## 2012-01-01 MED ORDER — DOXAZOSIN MESYLATE 4 MG PO TABS
4.0000 mg | ORAL_TABLET | Freq: Every day | ORAL | Status: DC
  Administered 2012-01-01 – 2012-01-03 (×3): 4 mg via ORAL
  Filled 2012-01-01 (×3): qty 1

## 2012-01-01 MED ORDER — LEVOTHYROXINE SODIUM 75 MCG PO TABS
75.0000 ug | ORAL_TABLET | Freq: Every day | ORAL | Status: DC
  Administered 2012-01-01 – 2012-01-03 (×3): 75 ug via ORAL
  Filled 2012-01-01 (×4): qty 1

## 2012-01-01 MED ORDER — HEPARIN SODIUM (PORCINE) 5000 UNIT/ML IJ SOLN
5000.0000 [IU] | Freq: Three times a day (TID) | INTRAMUSCULAR | Status: DC
  Administered 2012-01-01 – 2012-01-03 (×6): 5000 [IU] via SUBCUTANEOUS
  Filled 2012-01-01 (×9): qty 1

## 2012-01-01 MED ORDER — NADOLOL 40 MG PO TABS
40.0000 mg | ORAL_TABLET | Freq: Every day | ORAL | Status: DC
  Administered 2012-01-01 – 2012-01-02 (×2): 40 mg via ORAL
  Filled 2012-01-01 (×3): qty 1

## 2012-01-01 MED ORDER — ASPIRIN 81 MG PO CHEW: 324.0000 mg | CHEWABLE_TABLET | ORAL | Status: AC

## 2012-01-01 MED ORDER — SODIUM CHLORIDE 0.9 % IV SOLN
INTRAVENOUS | Status: DC
  Administered 2012-01-01: 23:00:00 via INTRAVENOUS

## 2012-01-01 MED ORDER — ASPIRIN EC 81 MG PO TBEC
81.0000 mg | DELAYED_RELEASE_TABLET | Freq: Every day | ORAL | Status: DC
  Administered 2012-01-02 – 2012-01-03 (×2): 81 mg via ORAL
  Filled 2012-01-01 (×2): qty 1

## 2012-01-01 MED ORDER — ACETAMINOPHEN 325 MG PO TABS: 650.0000 mg | ORAL_TABLET | ORAL | Status: DC | PRN

## 2012-01-01 MED ORDER — ASPIRIN 325 MG PO TABS
325.0000 mg | ORAL_TABLET | Freq: Once | ORAL | Status: AC
  Administered 2012-01-01: 325 mg via ORAL
  Filled 2012-01-01: qty 1

## 2012-01-01 MED ORDER — NITROGLYCERIN 0.4 MG SL SUBL: 0.4000 mg | SUBLINGUAL_TABLET | SUBLINGUAL | Status: DC | PRN

## 2012-01-01 MED ORDER — HYDROMORPHONE HCL PF 1 MG/ML IJ SOLN
1.0000 mg | Freq: Once | INTRAMUSCULAR | Status: AC
  Administered 2012-01-01: 1 mg via INTRAVENOUS
  Filled 2012-01-01: qty 1

## 2012-01-01 MED ORDER — MORPHINE SULFATE ER 60 MG PO CP24: 60.0000 mg | ORAL_CAPSULE | Freq: Three times a day (TID) | ORAL | Status: DC

## 2012-01-01 NOTE — ED Provider Notes (Signed)
History     CSN: [id number]  Arrival date & time 12/31/11  2112   First MD Initiated Contact with Patient 01/01/12 0002      Chief Complaint  Patient presents with  . Chest Pain    (Consider location/radiation/quality/duration/timing/severity/associated sxs/prior treatment) Patient is a 61 y.o. male presenting with chest pain. The history is provided by the patient.  Chest Pain The chest pain began 3 - 5 hours ago. Chest pain occurs constantly. The chest pain is unchanged. At its most intense, the pain is at 8/10. The pain is currently at 5/10. The severity of the pain is moderate. The quality of the pain is described as sharp. The pain radiates to the left arm. Primary symptoms include shortness of breath. Pertinent negatives for primary symptoms include no fever, no fatigue, no syncope, no cough, no wheezing, no palpitations, no abdominal pain, no nausea, no vomiting and no dizziness.  Associated symptoms include lower extremity edema.  Pertinent negatives for associated symptoms include no diaphoresis, no near-syncope, no numbness and no weakness. He tried nothing for the symptoms. Risk factors include male gender.  His past medical history is significant for hypertension and MI.  Pertinent negatives for past medical history include no aortic dissection, no pacemaker and no PE.   patient with history of stents is at 4 in the past most recently it was in 7 months ago that one was placed. No pain since then today's pain reminds him of the pain that he had during that time.  Past Medical History  Diagnosis Date  . Hypertension   . Coronary artery disease   . Myocardial infarction   . Chronic back pain   . Hypercholesteremia   . GERD (gastroesophageal reflux disease)     Past Surgical History  Procedure Date  . Coronary angioplasty with stent placement   . Back surgery   . Neck surgery   . Lumbar fusion 2009    History reviewed. No pertinent family history.  History    Substance Use Topics  . Smoking status: Former Smoker    Types: Cigarettes    Quit date: 07/06/1991  . Smokeless tobacco: Never Used  . Alcohol Use: No      Review of Systems  Constitutional: Negative for fever, diaphoresis and fatigue.  HENT: Negative for congestion.   Eyes: Positive for redness.  Respiratory: Positive for shortness of breath. Negative for cough and wheezing.   Cardiovascular: Positive for chest pain. Negative for palpitations, syncope and near-syncope.  Gastrointestinal: Negative for nausea, vomiting and abdominal pain.  Genitourinary: Negative for dysuria.  Musculoskeletal: Positive for back pain.  Skin: Negative for rash.  Neurological: Negative for dizziness, weakness, numbness and headaches.  Hematological: Does not bruise/bleed easily.    Allergies  Penicillins and Sulfa antibiotics  Home Medications   Current Outpatient Rx  Name Route Sig Dispense Refill  . ASPIRIN EC 81 MG PO TBEC Oral Take 81 mg by mouth daily.    Marland Kitchen CETIRIZINE HCL 10 MG PO TABS Oral Take 10 mg by mouth at bedtime.      Marland Kitchen CITALOPRAM HYDROBROMIDE 20 MG PO TABS Oral Take 10 mg by mouth every morning.      Marland Kitchen DOXAZOSIN MESYLATE 4 MG PO TABS Oral Take 4 mg by mouth every morning.      Marland Kitchen LEVOTHYROXINE SODIUM 50 MCG PO TABS Oral Take 75 mcg by mouth daily.     . MORPHINE SULFATE ER 60 MG PO CP24 Oral Take 60 mg by  mouth every 8 (eight) hours. With Morphine 15mg   For a total of 75mg  every 8 hours     . MORPHINE SULFATE 15 MG PO TABS Oral Take 15 mg by mouth every 8 (eight) hours. For a total of 75mg  every 8 hours     . MULTIVITAMINS PO CAPS Oral Take 1 capsule by mouth daily.      Marland Kitchen NADOLOL 40 MG PO TABS Oral Take 40 mg by mouth at bedtime.      . OMEPRAZOLE 20 MG PO CPDR Oral Take 20 mg by mouth 2 (two) times daily.      Marland Kitchen PRASUGREL HCL 10 MG PO TABS Oral Take 10 mg by mouth.      Marland Kitchen SIMVASTATIN 40 MG PO TABS Oral Take 40 mg by mouth at bedtime.        BP 142/88  Pulse 78  Temp(Src)  98.1 F (36.7 C) (Oral)  Resp 20  Ht 6\' 1"  (1.854 m)  Wt 240 lb (108.863 kg)  BMI 31.66 kg/m2  SpO2 98%  Physical Exam  Nursing note and vitals reviewed. Constitutional: He is oriented to person, place, and time. He appears well-developed and well-nourished. No distress.  HENT:  Head: Normocephalic and atraumatic.  Mouth/Throat: Oropharynx is clear and moist.  Eyes: Conjunctivae and EOM are normal. Pupils are equal, round, and reactive to light.  Neck: Normal range of motion. Neck supple. No JVD present.  Cardiovascular: Normal rate, regular rhythm, normal heart sounds and intact distal pulses.   No murmur heard. Pulmonary/Chest: Effort normal. No respiratory distress. He has no wheezes. He has no rales. He exhibits no tenderness.  Abdominal: Soft. Bowel sounds are normal. There is no tenderness.  Musculoskeletal: Normal range of motion. He exhibits no edema and no tenderness.       No leg swelling  Neurological: He is alert and oriented to person, place, and time. No cranial nerve deficit. He exhibits normal muscle tone. Coordination normal.  Skin: Skin is warm. No rash noted. He is not diaphoretic.    ED Course  Procedures (including critical care time)  Labs Reviewed  COMPREHENSIVE METABOLIC PANEL - Abnormal; Notable for the following:    Potassium 3.0 (*)    All other components within normal limits  CBC  DIFFERENTIAL  TROPONIN I  POCT I-STAT TROPONIN I  POCT I-STAT TROPONIN I   Ct Angio Chest W/cm &/or Wo Cm  01/01/2012  *RADIOLOGY REPORT*  Clinical Data: Chest pain.  CT ANGIOGRAPHY CHEST  Technique:  Multidetector CT imaging of the chest using the standard protocol during bolus administration of intravenous contrast. Multiplanar reconstructed images including MIPs were obtained and reviewed to evaluate the vascular anatomy.  Contrast: OMNIPAQUE IOHEXOL 350 MG/ML IV SOLN  Comparison: Chest radiograph performed 01/10/2012  Findings: There is no evidence of pulmonary  embolus.  The lungs are clear bilaterally.  Evaluation is mildly suboptimal due to motion artifact.  There is no evidence of significant focal consolidation, pleural effusion or pneumothorax.  No masses are identified; no abnormal focal contrast enhancement is seen.  An accessory azygos lobe is noted.  Diffuse coronary artery calcifications are seen. There is a borderline enlarged subcarinal node, measuring 1.1 cm in short axis.  No additional mediastinal lymphadenopathy is seen.  A trace pericardial effusion is noted.  The great vessels are unremarkable in appearance.  No axillary lymphadenopathy is seen.  The thyroid gland is unremarkable in appearance.  Scattered small hypodensities within the liver are nonspecific, but likely  reflect small cysts.  The visualized portions of the spleen are unremarkable in appearance, given the phase of contrast enhancement.  Nonspecific perinephric stranding is noted bilaterally.  The visualized portions of the stomach and pancreas are unremarkable.  No acute osseous abnormalities are seen.  The patient is status post anterior cervical spinal fusion.  IMPRESSION:  1.  No evidence of pulmonary embolus. 2.  Lungs clear bilaterally. 3.  Diffuse coronary artery calcifications seen. 4.  Borderline enlarged subcarinal node, measuring 1.1 cm in short axis, likely within normal limits. 5.  Scattered small hypodensities within the liver appears stable from the prior CT of the abdomen and pelvis performed 01/24/2011, and likely reflect small cysts. 6.  Trace pericardial effusion noted.  Original Report Authenticated By: Tonia Ghent, M.D.   Dg Chest Portable 1 View  12/31/2011  *RADIOLOGY REPORT*  Clinical Data: Chest pain  PORTABLE CHEST - 1 VIEW  Comparison: 06/13/2011  Findings: The heart size and mediastinal contours are within normal limits.  Both lungs are clear.  The visualized skeletal structures are unremarkable.  IMPRESSION: Negative exam.  Original Report Authenticated By:  Rosealee Albee, M.D.    Date: 01/01/2012  Rate: 91  Rhythm: premature atrial contractions (PAC)  QRS Axis: normal  Intervals: normal  ST/T Wave abnormalities: nonspecific ST changes  Conduction Disutrbances:none  Narrative Interpretation:   Old EKG Reviewed: unchanged No significant change in EKG from 06/15/2011 Results for orders placed during the hospital encounter of 12/31/11  CBC      Component Value Range   WBC 9.2  4.0 - 10.5 (K/uL)   RBC 4.83  4.22 - 5.81 (MIL/uL)   Hemoglobin 13.7  13.0 - 17.0 (g/dL)   HCT 72.5  36.6 - 44.0 (%)   MCV 83.9  78.0 - 100.0 (fL)   MCH 28.4  26.0 - 34.0 (pg)   MCHC 33.8  30.0 - 36.0 (g/dL)   RDW 34.7  42.5 - 95.6 (%)   Platelets 182  150 - 400 (K/uL)  DIFFERENTIAL      Component Value Range   Neutrophils Relative 74  43 - 77 (%)   Neutro Abs 6.8  1.7 - 7.7 (K/uL)   Lymphocytes Relative 15  12 - 46 (%)   Lymphs Abs 1.4  0.7 - 4.0 (K/uL)   Monocytes Relative 10  3 - 12 (%)   Monocytes Absolute 0.9  0.1 - 1.0 (K/uL)   Eosinophils Relative 1  0 - 5 (%)   Eosinophils Absolute 0.1  0.0 - 0.7 (K/uL)   Basophils Relative 0  0 - 1 (%)   Basophils Absolute 0.0  0.0 - 0.1 (K/uL)  TROPONIN I      Component Value Range   Troponin I <0.30  <0.30 (ng/mL)  COMPREHENSIVE METABOLIC PANEL      Component Value Range   Sodium 137  135 - 145 (mEq/L)   Potassium 3.0 (*) 3.5 - 5.1 (mEq/L)   Chloride 103  96 - 112 (mEq/L)   CO2 23  19 - 32 (mEq/L)   Glucose, Bld 79  70 - 99 (mg/dL)   BUN 14  6 - 23 (mg/dL)   Creatinine, Ser 3.87  0.50 - 1.35 (mg/dL)   Calcium 9.6  8.4 - 56.4 (mg/dL)   Total Protein 7.2  6.0 - 8.3 (g/dL)   Albumin 4.3  3.5 - 5.2 (g/dL)   AST 12  0 - 37 (U/L)   ALT 9  0 - 53 (U/L)   Alkaline  Phosphatase 80  39 - 117 (U/L)   Total Bilirubin 0.6  0.3 - 1.2 (mg/dL)   GFR calc non Af Amer >90  >90 (mL/min)   GFR calc Af Amer >90  >90 (mL/min)  POCT I-STAT TROPONIN I      Component Value Range   Troponin i, poc 0.00  0.00 - 0.08  (ng/mL)   Comment 3           POCT I-STAT TROPONIN I      Component Value Range   Troponin i, poc 0.01  0.00 - 0.08 (ng/mL)   Comment 3              1. Chest pain       MDM  Discussed with [place] cardiology, accepted for admission at [place] telemetry bed will be transported by [facility]. Currently chest pain-free resolved after sublingual nitroglycerin and aspirin. CT chest negative for PE.that because patient had a history of left leg swelling 2 days ago which has now resolved. Based on the CT Angio no evidence of pulmonary embolism no evidence of great vessel disease. troponins are negative x2. However patient has history of stents most recently 7 months ago.         Shelda Jakes, MD 01/01/12 [phone]

## 2012-01-01 NOTE — H&P (Signed)
See dictated note. Transferred from [facility] for chest pain.  However, his primary complaint is palpitations and SOB.  Recently started synthroid.  Admitted for r/o MI and telemetry. Check TSH, ECHO.  Continue bblocker -consider uptitrating.

## 2012-01-01 NOTE — H&P (Signed)
NAMERAEVON, BROOM NO.:  [id number]  MEDICAL RECORD NO.:  [id number]  LOCATION:                                 FACILITY:  PHYSICIAN:  Natasha Bence, MD       DATE OF BIRTH:  Jun 30, 1951  DATE OF ADMISSION:  01/01/2012 DATE OF DISCHARGE:                             HISTORY & PHYSICAL   PRIMARY CARDIOLOGIST:  Theron Arista C. Eden Emms, MD, [facility]  CHIEF COMPLAINT:  Palpitations and chest pain.  HISTORY OF PRESENT ILLNESS:  Mr. Reetz is a 61 year old male with known coronary artery disease, who is followed by Dr. Eden Emms who presented to the [facility] Emergency Department with chief complaint of palpitations that began last night while he was lying down.  He experienced some mild chest discomfort, although this was not nearly as intense as he had had on previous episodes.  His primary complaint was shortness of breath and feeling of his heart skipping beats.  He has never experienced episode like this before.  He denies any lightheadedness or presyncope.  He has been doing very well and since his last stent has not had any recurrent chest discomfort.  He does have mild chronic dyspnea on exertion.  This is largely unchanged.  He denies any PND or orthopnea.  He also denies any bleeding or melena.  Of note he was recently diagnosed with hypothyroidism and has recently started treatment with Synthroid.  He says at times he has periods where he is very hot natured, feels somewhat anxious.  He denies any problems with constipation or diarrhea or hypothyroid symptoms.  Otherwise denies any fevers chills, sweats. No weight loss or weight gain.  In the emergency department at [facility] due to his abrupt shortness of breath this evening, they obtained a CT with PE protocol which was negative for pulmonary embolism. Currently he feels well and is without shortness of breath.  He does note his diastolic blood pressure has been running high in the 100-110s. Complete review of  systems was performed and was otherwise negative except for what is stated above.  PAST MEDICAL HISTORY:  Coronary artery disease, status post drug-eluting stent placed in inferior subbranch of his large bifurcating marginal branch.  This was placed in an in-stent restenosis on June 14, 2011. He also has a stent in his LAD, diffusely calcified disease in that vessel.  He has a history of Plavix resistance and is on Effient.  Also has a history of dyslipidemia, hypertension, hypothyroidism, and chronic osteoarthritis.  SURGICAL HISTORY:  History of back surgery and neck surgery.  FAMILY HISTORY:  No history of coronary artery disease.  SOCIAL HISTORY:  He is a former smoker.  He quit 20 years ago.  He denies any alcohol or illicit drug use.  ALLERGIES:  He is allergic to penicillin and sulfa.  HOME MEDICATIONS: 1. Aspirin 81 mg p.o. daily. 2. Zyrtec 10 mg p.o. daily. 3. Celexa 20 mg p.o. daily. 4. Doxazosin 4 mg p.o. daily. 5. Synthroid 75 mcg p.o. daily. 6. Morphine 24 hour capsule 60 mg every 8 hours. 7. Morphine IR 15 mg q.8 hours p.r.n. 8. Nadolol 40 mg p.o. at bedtime.  9. Omeprazole 20 mg p.o. b.i.d. 10.Prasugrel 10 mg p.o. daily. 11.Simvastatin 40 mg p.o. at bedtime.  PHYSICAL EXAMINATION:  VITAL SIGNS:  He is afebrile with temperature 98.4, pulse is 78 and regular, respiratory rate is 20, blood pressure 158/83, O2 sats 98% on room air. GENERAL:  He is well-developed white male, in no apparent distress. HEENT:  Eyes:  Anicteric sclerae. NECK:  He has normal jugular venous pressure.  No carotid bruits.  No thyromegaly. HEENT:  Mucous membranes are moist. LUNGS:  Clear to auscultation bilaterally. CARDIOVASCULAR:  Regular rate and rhythm.  No murmurs, rubs, or gallops. ABDOMEN:  Soft, nontender, nondistended. EXTREMITIES:  Warm with no edema.  He has symmetrical pulses throughout. NEURO:  Grossly afocal. SKIN:  No rashes or ulcers.  LABS:  Sodium was 137,  potassium 3.0, chloride 103, bicarb 23, BUN of 14, creatinine of 0.77, calcium of 9.6, glucose 79.  Liver enzymes are within normal limits.  Troponin was less than 0.3.  White blood cell count is 9.2 with normal differential.  Hematocrit is 41, platelet count was 182.  The patient had a CT chest angiogram to rule out PE which was negative for PE.  EKG shows sinus mechanism with a rate of 92 beats per minute.  He has PACs and nonspecific ST changes.  IMPRESSION AND PLAN:  This is a 61 year old male with known coronary artery disease status post drug-eluting stent, stents most recent August 2012, also has a history of Plavix resistance on Effient who presents primarily with complaint of palpitations.  He did experience mild chest discomfort, however, he has been doing very well recently after his stent in August.  His primary complaint was witnessed palpitations with associated shortness of breath.  He had a negative PE protocol scan in the ER at [facility].  He has been in sinus rhythm since he has been here which is very brief.  He has already had negative troponins and has no obvious ischemic evidence on his EKG.  We will admit him for telemetry and rule him out for myocardial infarction, however, given the low burden of his symptoms and the infrequency of his symptoms, unsure if we will be able to see anything on telemetry.  We may need to have a device or a Holter monitor.  He does not have any heart failure symptoms, but given his admission we will order an echocardiogram.  It has been almost 10 years since his last echocardiogram.  Of note, he has recently started Synthroid for hypothyroidism which may be contributory. We will go ahead and check a TSH.          ______________________________ Natasha Bence, MD     MH/MEDQ  D:  01/01/2012  T:  01/01/2012  Job:  [id number]

## 2012-01-01 NOTE — ED Notes (Signed)
Patient requesting pain medication - usually takes Morphine 75mg  po at home every 8 hours for back pain.  Dr Herma Carson made aware

## 2012-01-01 NOTE — ED Notes (Signed)
Patient's wife states she is going home and is requesting [place] to call when patient is transferred to [facility]. Name is [name] and number is [phone].

## 2012-01-02 DIAGNOSIS — R0609 Other forms of dyspnea: Secondary | ICD-10-CM

## 2012-01-02 DIAGNOSIS — R0600 Dyspnea, unspecified: Secondary | ICD-10-CM

## 2012-01-02 DIAGNOSIS — R0989 Other specified symptoms and signs involving the circulatory and respiratory systems: Secondary | ICD-10-CM

## 2012-01-02 DIAGNOSIS — R002 Palpitations: Principal | ICD-10-CM

## 2012-01-02 DIAGNOSIS — I2510 Atherosclerotic heart disease of native coronary artery without angina pectoris: Secondary | ICD-10-CM

## 2012-01-02 LAB — BASIC METABOLIC PANEL
CO2: 25 mEq/L (ref 19–32)
Calcium: 8.6 mg/dL (ref 8.4–10.5)
Creatinine, Ser: 0.79 mg/dL (ref 0.50–1.35)
Glucose, Bld: 93 mg/dL (ref 70–99)
Sodium: 142 mEq/L (ref 135–145)

## 2012-01-02 NOTE — Progress Notes (Signed)
Patient ID: [name], male   DOB: 07-14-1951, 61 y.o.   MRN: [id number] @ Subjective:  Admitted with palpitations and dyspnea.  Recently had Thyroid medicine increased for TSH 15.  Known CAD  1. Coronary artery disease.  a. Status post Promus drug-eluting stent placement for high-  grade in-stent restenosis in the inferior subbranch of the large  bifurcating marginal, June 14, 2011, with continued patency of  the stent to the left anterior descending with diffusely calcified  artery.  b. Status post percutaneous coronary intervention in 2003 by  Dr. Chales Abrahams with stents to the mid left anterior descending.  c. P2Y12 testing this admission demonstrating a PRU of 330, the  patient changed to Effient.  Objective:  Filed Vitals:   01/01/12 0646 01/01/12 1506 01/01/12 2100 01/02/12 0500  BP: 158/83 128/74 125/75 112/71  Pulse: 78 91 72 52  Temp: 98.4 F (36.9 C) 98.4 F (36.9 C) 97.9 F (36.6 C) 97.6 F (36.4 C)  TempSrc:  Oral    Resp: 20 18 18 18   Height: 6\' 1"  (1.854 m)     Weight: 235 lb 7.2 oz (106.8 kg)     SpO2: 98% 97% 99% 100%    Intake/Output from previous day:  Intake/Output Summary (Last 24 hours) at 01/02/12 1118 Last data filed at 01/02/12 0700  Gross per 24 hour  Intake    580 ml  Output      2 ml  Net    578 ml    Physical Exam: Affect appropriate Healthy:  appears stated age HEENT: normal Neck supple with no adenopathy JVP normal no bruits no thyromegaly Lungs clear with no wheezing and good diaphragmatic motion Heart:  S1/S2 no murmur, no rub, gallop or click PMI normal Abdomen: benighn, BS positve, no tenderness, no AAA no bruit.  No HSM or HJR Distal pulses intact with no bruits No edema Neuro non-focal Skin warm and dry No muscular weakness   Lab Results: Basic Metabolic Panel:  Basename 01/02/12 0615 01/01/12 0810 12/31/11 2130  NA 142 -- 137  K 3.7 -- 3.0*  CL 109 -- 103  CO2 25 -- 23  GLUCOSE 93 -- 79  BUN 13 -- 14  CREATININE  0.79 0.77 --  CALCIUM 8.6 -- 9.6  MG -- -- --  PHOS -- -- --   Liver Function Tests:  [facility] 12/31/11 2130  AST 12  ALT 9  ALKPHOS 80  BILITOT 0.6  PROT 7.2  ALBUMIN 4.3   No results found for this basename: LIPASE:2,AMYLASE:2 in the last 72 hours CBC:  Basename 01/01/12 0810 12/31/11 2130  WBC 7.6 9.2  NEUTROABS -- 6.8  HGB 12.3* 13.7  HCT 36.7* 40.5  MCV 84.4 83.9  PLT 155 182   Cardiac Enzymes:  Basename 01/01/12 1942 01/01/12 1425 01/01/12 0825  CKTOTAL 40 47 51  CKMB 1.9 2.5 2.7  CKMBINDEX -- -- --  TROPONINI <0.30 <0.30 <0.30     Basename 01/01/12 0810  TSH 15.420*  T4TOTAL --  T3FREE --  THYROIDAB --   Anemia Panel: No results found for this basename: VITAMINB12,FOLATE,FERRITIN,TIBC,IRON,RETICCTPCT in the last 72 hours  Imaging: Ct Angio Chest W/cm &/or Wo Cm  01/01/2012  *RADIOLOGY REPORT*  Clinical Data: Chest pain.  CT ANGIOGRAPHY CHEST  Technique:  Multidetector CT imaging of the chest using the standard protocol during bolus administration of intravenous contrast. Multiplanar reconstructed images including MIPs were obtained and reviewed to evaluate the vascular anatomy.  Contrast: OMNIPAQUE IOHEXOL  350 MG/ML IV SOLN  Comparison: Chest radiograph performed 01/10/2012  Findings: There is no evidence of pulmonary embolus.  The lungs are clear bilaterally.  Evaluation is mildly suboptimal due to motion artifact.  There is no evidence of significant focal consolidation, pleural effusion or pneumothorax.  No masses are identified; no abnormal focal contrast enhancement is seen.  An accessory azygos lobe is noted.  Diffuse coronary artery calcifications are seen. There is a borderline enlarged subcarinal node, measuring 1.1 cm in short axis.  No additional mediastinal lymphadenopathy is seen.  A trace pericardial effusion is noted.  The great vessels are unremarkable in appearance.  No axillary lymphadenopathy is seen.  The thyroid gland is unremarkable in  appearance.  Scattered small hypodensities within the liver are nonspecific, but likely reflect small cysts.  The visualized portions of the spleen are unremarkable in appearance, given the phase of contrast enhancement.  Nonspecific perinephric stranding is noted bilaterally.  The visualized portions of the stomach and pancreas are unremarkable.  No acute osseous abnormalities are seen.  The patient is status post anterior cervical spinal fusion.  IMPRESSION:  1.  No evidence of pulmonary embolus. 2.  Lungs clear bilaterally. 3.  Diffuse coronary artery calcifications seen. 4.  Borderline enlarged subcarinal node, measuring 1.1 cm in short axis, likely within normal limits. 5.  Scattered small hypodensities within the liver appears stable from the prior CT of the abdomen and pelvis performed 01/24/2011, and likely reflect small cysts. 6.  Trace pericardial effusion noted.  Original Report Authenticated By: Tonia Ghent, M.D.   Dg Chest Portable 1 View  12/31/2011  *RADIOLOGY REPORT*  Clinical Data: Chest pain  PORTABLE CHEST - 1 VIEW  Comparison: 06/13/2011  Findings: The heart size and mediastinal contours are within normal limits.  Both lungs are clear.  The visualized skeletal structures are unremarkable.  IMPRESSION: Negative exam.  Original Report Authenticated By: Rosealee Albee, M.D.    Cardiac Studies:  ECG:   NSR rate 92 PAC no acute ischemic changes.  12/31/11   Telemetry: NSR no PAF or arrhythmia  Echo:  Ordered 3/3  Medications:     . aspirin  324 mg Oral NOW   Or  . aspirin  300 mg Rectal NOW  . aspirin EC  81 mg Oral Daily  . citalopram  10 mg Oral Daily  . doxazosin  4 mg Oral Daily  . heparin  5,000 Units Subcutaneous Q8H  . levothyroxine  75 mcg Oral Q0600  . morphine  30 mg Oral Q12H  . morphine  15 mg Oral Q8H  . nadolol  40 mg Oral QHS  . pantoprazole  40 mg Oral Q1200  . potassium chloride  20 mEq Oral Daily  . prasugrel  10 mg Oral Daily  . simvastatin  40 mg Oral  QHS       . sodium chloride 100 mL/hr at 01/01/12 2233    Assessment/Plan:  Palpitations:  May be related to increase in synthroid.  Tele shows no PAF.  Continue Nadolol.  Dyspnea:  No signs of CHF or ischemia.  Echo CAD:  Stable continue ASA and Effient Chol:  Known CAD continue statin  Charlton Haws 01/02/2012, 11:18 AM

## 2012-01-02 NOTE — Progress Notes (Signed)
  Echocardiogram 2D Echocardiogram has been performed.  Cathie Beams Deneen 01/02/2012, 12:24 PM

## 2012-01-02 NOTE — Clinical Documentation Improvement (Signed)
CHEST PAIN DOCUMENTATION CLARIFICATION QUERY  THIS DOCUMENT IS NOT A PERMANENT PART OF THE MEDICAL RECORD  TO RESPOND TO THE THIS QUERY, FOLLOW THE INSTRUCTIONS BELOW:  1. If needed, update documentation for the patient's encounter via the notes activity.  2. Access this query again and click edit on the In [employer].  3. After updating, or not, click F2 to complete all highlighted (required) fields concerning your review. Select "additional documentation in the medical record" OR "no additional documentation provided".  4. Click Sign note button.  5. The deficiency will fall out of your In Basket *Please let us know if you are not able to complete this workflow by phone or e-mail (listed below).          01/02/12  Dear Dr. Eden Emms Marton Redwood  In an effort to better capture your patient's severity of illness, reflect appropriate length of stay and utilization of resources, a review of the patient medical record has revealed the following indicators.    Based on your clinical judgment, please clarify and document in a progress note and/or discharge summary the clinical condition associated with the following supporting information:  In responding to this query please exercise your independent judgment.  The fact that a query is asked, does not imply that any particular answer is desired or expected.  Possible Clinical Conditions?   Based on your clinical judgment, please clarify cause of Chest Pain:  _______Unstable Angina  _______Musculoskeletal Chest Pain _______Pleuritic Chest Pain _______GERD _______Costochondritis _______Cholelithiasis / Cholecystitis _______Dressler's syndrome _______Chest wall pain _______Other Condition_______________________ _______Cannot Clinically Determine  Supporting Information: Palpitations and Chest Pain noted per 3/3 progress notes. Palpitations and dyspnea, recently had thyroid medicine increased, known history of CAD noted per  3/4 progress notes.   You may use possible, probable, or suspect with inpatient documentation. possible, probable, suspected diagnoses MUST be documented at the time of discharge  Reviewed:  no additional documentation provided  Thank You,  Marciano Sequin,  Clinical Documentation Specialist:  Pager: [phone]  Health Information Management [place]   I did not admit patient.  On rounds today he did not complain of pain.  Today his primary issues Are dyspnea and palpitations which appear more related to recent change in thyroid medicine No further inpatient evaluation of chest pain is currently being considered  Charlton Haws

## 2012-01-03 ENCOUNTER — Ambulatory Visit: Payer: No Typology Code available for payment source | Admitting: Cardiovascular Disease

## 2012-01-03 DIAGNOSIS — R079 Chest pain, unspecified: Secondary | ICD-10-CM

## 2012-01-03 DIAGNOSIS — R7989 Other specified abnormal findings of blood chemistry: Secondary | ICD-10-CM

## 2012-01-03 MED ORDER — NITROGLYCERIN 0.4 MG SL SUBL: 0.4000 mg | SUBLINGUAL_TABLET | SUBLINGUAL | Status: DC | PRN

## 2012-01-03 MED FILL — Morphine Sulfate Inj 10 MG/ML: INTRAMUSCULAR | Qty: 1 | Status: AC

## 2012-01-03 NOTE — Progress Notes (Signed)
Patient ID: [name], male   DOB: 1951-04-22, 61 y.o.   MRN: [id number] @ Subjective:  Admitted with palpitations and dyspnea.  Recently had Thyroid medicine increased for TSH 15.  Known CAD Daughter in hospital just had her thyroid removed by Dr Jearld Fenton  1. Coronary artery disease.  a. Status post Promus drug-eluting stent placement for high-  grade in-stent restenosis in the inferior subbranch of the large  bifurcating marginal, June 14, 2011, with continued patency of  the stent to the left anterior descending with diffusely calcified  artery.  b. Status post percutaneous coronary intervention in 2003 by  Dr. Chales Abrahams with stents to the mid left anterior descending.  c. P2Y12 testing this admission demonstrating a PRU of 330, the  patient changed to Effient.  Objective:  Filed Vitals:   01/02/12 0500 01/02/12 1417 01/02/12 2119 01/03/12 0500  BP: 112/71 105/64 112/70 111/71  Pulse: 52 55 60 56  Temp: 97.6 F (36.4 C) 98.6 F (37 C) 98.3 F (36.8 C) 98 F (36.7 C)  TempSrc:  Oral Oral Oral  Resp: 18 18  18   Height:      Weight:      SpO2: 100% 98% 98% 99%    Intake/Output from previous day:  Intake/Output Summary (Last 24 hours) at 01/03/12 0803 Last data filed at 01/02/12 1700  Gross per 24 hour  Intake    840 ml  Output      0 ml  Net    840 ml    Physical Exam: Affect appropriate Healthy:  appears stated age HEENT: normal Neck supple with no adenopathy JVP normal no bruits no thyromegaly Lungs clear with no wheezing and good diaphragmatic motion Heart:  S1/S2 no murmur, no rub, gallop or click PMI normal Abdomen: benighn, BS positve, no tenderness, no AAA no bruit.  No HSM or HJR Distal pulses intact with no bruits No edema Neuro non-focal Skin warm and dry No muscular weakness   Lab Results: Basic Metabolic Panel:  Basename 01/02/12 0615 01/01/12 0810 12/31/11 2130  NA 142 -- 137  K 3.7 -- 3.0*  CL 109 -- 103  CO2 25 -- 23  GLUCOSE 93 -- 79    BUN 13 -- 14  CREATININE 0.79 0.77 --  CALCIUM 8.6 -- 9.6  MG -- -- --  PHOS -- -- --   Liver Function Tests:  [facility] 12/31/11 2130  AST 12  ALT 9  ALKPHOS 80  BILITOT 0.6  PROT 7.2  ALBUMIN 4.3   No results found for this basename: LIPASE:2,AMYLASE:2 in the last 72 hours CBC:  Basename 01/01/12 0810 12/31/11 2130  WBC 7.6 9.2  NEUTROABS -- 6.8  HGB 12.3* 13.7  HCT 36.7* 40.5  MCV 84.4 83.9  PLT 155 182   Cardiac Enzymes:  Basename 01/01/12 1942 01/01/12 1425 01/01/12 0825  CKTOTAL 40 47 51  CKMB 1.9 2.5 2.7  CKMBINDEX -- -- --  TROPONINI <0.30 <0.30 <0.30     Basename 01/01/12 0810  TSH 15.420*  T4TOTAL --  T3FREE --  THYROIDAB --   Anemia Panel: No results found for this basename: VITAMINB12,FOLATE,FERRITIN,TIBC,IRON,RETICCTPCT in the last 72 hours  Imaging: No results found.  Cardiac Studies:  ECG:   NSR rate 92 PAC no acute ischemic changes.  12/31/11   Telemetry: NSR no PAF or arrhythmia  Echo:  Reviewed EF 55-60%  Medications:      . aspirin EC  81 mg Oral Daily  . citalopram  10 mg  Oral Daily  . doxazosin  4 mg Oral Daily  . heparin  5,000 Units Subcutaneous Q8H  . levothyroxine  75 mcg Oral Q0600  . morphine  30 mg Oral Q12H  . morphine  15 mg Oral Q8H  . nadolol  40 mg Oral QHS  . pantoprazole  40 mg Oral Q1200  . potassium chloride  20 mEq Oral Daily  . prasugrel  10 mg Oral Daily  . simvastatin  40 mg Oral QHS        . sodium chloride 100 mL/hr at 01/01/12 2233    Assessment/Plan:  Palpitations:  May be related to increase in synthroid.  Tele shows no PAF.  Continue Nadolol.  No need for holter Dyspnea:  No signs of CHF or ischemia.  Echo shows normal EF CAD:  Stable continue ASA and Effient Chol:  Known CAD continue statin  D/C home outpatient F/U primary for synthroid dose adjustment and me in 3 months  Charlton Haws 01/03/2012, 8:03 AM

## 2012-01-03 NOTE — Discharge Summary (Signed)
Discharge Summary   Patient ID: [name] MRN: [id number], DOB/AGE: 61-10-1951 61 y.o.  Primary MD: Lilyan Punt MD Primary Cardiologist: Charlton Haws MD  Admit date: 12/31/2011 D/C date:     01/03/2012      Primary Discharge Diagnoses:  1. Palpitations  - Felt r/t hypothyroidism, TSH 15.42  - No A. Fib or arrhythmias on telemetry, No indication for outpt holter monitor  - Outpatient f/u with PCP for synthroid dose adjustment    2. Dyspnea  - No signs of CHF or ischemia  - Cardiac enzymes negative  - Echo 01/02/12 revealed EF 55-60%, basal inferior hypokinesis, grade 1 diastolic dysfunction  - Chest CTA negative for PE or acute cardiopulmonary findings  Secondary Discharge Diagnoses:  1. CAD - S/p Promus DES  for high-grade ISR in the inferior subbranch of the large bifurcating marginal, 06/14/11, w/ continued patency of the stent to the LAD w/ diffusely calcified artery. S/p PCI 2003 by Dr. Chales Abrahams with stents to the mid LAD.  2. Plavix resistance, PRU 330  3. HTN 4. HLD - NFA213 on 06/14/11  5. Hypothyroidism 6. Osteoarthritis 7. Back surgery 8. Neck surgery 9. Former tobacco abuser, quit in the 1990s  Allergies Allergies  Allergen Reactions  . Penicillins Other (See Comments)    "I passed out last time I had it and they told me not to take it again."  . Sulfa Antibiotics Swelling    Diagnostic Studies/Procedures:   01/02/12 - 2D Echocardiogram Study Conclusions:  Left ventricle: Difficult acoustic windows limit study. LVEF is approximately 55 to 60% with basal infeiror hypokinesis. The cavity size was normal. Wall thickness was normal. Systolic function was normal. The estimated ejection fraction was in the range of 55% to 60%. Doppler parameters are consistent with abnormal left ventricular relaxation (grade 1 diastolic dysfunction).  01/01/2012 - CTA Chest Findings: There is no evidence of pulmonary embolus.  The lungs are clear bilaterally.  Evaluation is mildly  suboptimal due to motion artifact.  There is no evidence of significant focal consolidation, pleural effusion or pneumothorax.  No masses are identified; no abnormal focal contrast enhancement is seen.  An accessory azygos lobe is noted.  Diffuse coronary artery calcifications are seen. There is a borderline enlarged subcarinal node, measuring 1.1 cm in short axis.  No additional mediastinal lymphadenopathy is seen.  A trace pericardial effusion is noted.  The great vessels are unremarkable in appearance.  No axillary lymphadenopathy is seen.  The thyroid gland is unremarkable in appearance.  Scattered small hypodensities within the liver are nonspecific, but likely reflect small cysts.  The visualized portions of the spleen are unremarkable in appearance, given the phase of contrast enhancement.  Nonspecific perinephric stranding is noted bilaterally.  The visualized portions of the stomach and pancreas are unremarkable.  No acute osseous abnormalities are seen.  The patient is status post anterior cervical spinal fusion.  IMPRESSION:  1.  No evidence of pulmonary embolus. 2.  Lungs clear bilaterally. 3.  Diffuse coronary artery calcifications seen. 4.  Borderline enlarged subcarinal node, measuring 1.1 cm in short axis, likely within normal limits. 5.  Scattered small hypodensities within the liver appears stable from the prior CT of the abdomen and pelvis performed 01/24/2011, and likely reflect small cysts. 6.  Trace pericardial effusion noted.     History of Present Illness: 61 y.o. male w/ above problems who presented to [facility] on 01/01/12 with chief c/o palpitations, mild chest discomfort, and sob for  which he was transferred to [facility] for further evaluation and treatment.  He reported his palpitations began the night prior to presentation while he was lying down. He experienced some mild chest discomfort, although this was not nearly as intense as he had had on previous episodes.  His primary complaint was shortness of breath and feeling of his heart skipping beats which he had never experienced before. He was recently diagnosed with hypothyroidism and had started treatment with Synthroid with last adjustment about 7mos ago.   Hospital Course: In the [facility] ED his EKG revealed NSR 92bpm w/ PACs and nonspecific ST changes. CXR was without acute cardiopulmonary abnormalities. Cardiac enzymes were negative x2. CTA chest was completed and negative for PE or acute cardiopulmonary findings. He was transferred to [facility] for further evaluation and treatment.  At [facility] he had no signs of volume overload or other findings to suggest CHF or ischemia. Cardiac enzymes remained negative. An echocardiogram was completed revealing EF 55-60%, basal inferior hypokinesis, grade 1 diastolic dysfunction. TSH was elevated at 15.42. It was felt his palpitations and dyspnea were likely related to hypothroidism. No atrial fibrillation or other arrhythmias were noted on telemetry and there were no other indications for further evaluation with an outpatient holter monitor. He will need outpatient follow up with his PCP for synthroid dose adjustment.  He was seen and evaluated by Dr. Eden Emms who felt he was stable for discharge home with plans for follow up as scheduled below.  Discharge Vitals: Blood pressure 111/71, pulse 56, temperature 98 F (36.7 C), temperature source Oral, resp. rate 18, height 6\' 1"  (1.854 m), weight 235 lb 7.2 oz (106.8 kg), SpO2 99.00%.  Labs: Component Value Date   WBC 7.6 01/01/2012   HGB 12.3* 01/01/2012   HCT 36.7* 01/01/2012   MCV 84.4 01/01/2012   PLT 155 01/01/2012    Lab 01/02/12 0615 12/31/11 2130  NA 142 --  K 3.7 --  CL 109 --  CO2 25 --  BUN 13 --  CREATININE 0.79 --  CALCIUM 8.6 --  PROT -- 7.2  BILITOT -- 0.6  ALKPHOS -- 80  ALT -- 9  AST -- 12  GLUCOSE 93 --   Basename 01/01/12 1942 01/01/12 1425 01/01/12 0825 12/31/11 2130  CKTOTAL  40 47 51 --  CKMB 1.9 2.5 2.7 --  TROPONINI <0.30 <0.30 <0.30 <0.30     01/01/2012 08:10  TSH 15.420 (H)    Discharge Medications   Medication List  As of 01/03/2012 11:10 AM   TAKE these medications         aspirin EC 81 MG tablet   Take 81 mg by mouth daily.      cetirizine 10 MG tablet   Commonly known as: ZYRTEC   Take 10 mg by mouth at bedtime.      citalopram 20 MG tablet   Commonly known as: CELEXA   Take 10 mg by mouth every morning.      doxazosin 4 MG tablet   Commonly known as: CARDURA   Take 4 mg by mouth every morning.      EFFIENT 10 MG Tabs   Generic drug: prasugrel   Take 10 mg by mouth.      levothyroxine 50 MCG tablet   Commonly known as: SYNTHROID, LEVOTHROID   Take 75 mcg by mouth daily.      morphine 60 MG 24 hr capsule   Commonly known as: KADIAN   Take  60 mg by mouth every 8 (eight) hours. With Morphine 15mg   For a total of 75mg  every 8 hours      morphine 15 MG tablet   Commonly known as: MSIR   Take 15 mg by mouth every 8 (eight) hours. For a total of 75mg  every 8 hours      multivitamin capsule   Take 1 capsule by mouth daily.      nadolol 40 MG tablet   Commonly known as: CORGARD   Take 40 mg by mouth at bedtime.      nitroGLYCERIN 0.4 MG SL tablet   Commonly known as: NITROSTAT   Place 1 tablet (0.4 mg total) under the tongue every 5 (five) minutes x 3 doses as needed for chest pain (up to 3 doses).      omeprazole 20 MG capsule   Commonly known as: PRILOSEC   Take 20 mg by mouth 2 (two) times daily.      simvastatin 40 MG tablet   Commonly known as: ZOCOR   Take 40 mg by mouth at bedtime.            Disposition   Discharge Orders    Future Appointments: Provider: Department: Dept Phone: Center:   02/07/2012 11:00 AM Fuller Canada, MD [facility] [phone] [facility]   04/10/2012 10:00 AM Wendall Stade, MD [facility] [phone] [facility]     Future Orders Please Complete By Expires   Diet - low  sodium heart healthy      Increase activity slowly      Discharge instructions      Comments:   *PLEASE REMEMBER TO BRING ALL OF YOUR MEDICATIONS TO EACH OF YOUR FOLLOW-UP OFFICE VISITS.  * Please follow up with your primary care provider as soon as possible for adjustment of your thyroid medication.     Follow-up Information    Schedule an appointment as soon as possible for a visit with Lilyan Punt, MD.   Contact information:   [address] [place] [address] [phone]       Follow up with Charlton Haws, MD on 04/10/2012. (10:00)    Contact information:   [facility] [address] [place] [address] [phone]           Outstanding Labs/Studies:  1. Need outpatient f/u with PCP for elevated TSH  Duration of Discharge Encounter: Greater than 30 minutes including physician and PA time.  Signed, Abeni Finchum PA-C 01/03/2012, 11:10 AM

## 2012-01-03 NOTE — Progress Notes (Signed)
Pt discharged to home per MD order. Pt alert and oriented with no complaints of pain.  Pt received all discharge instructions and medication information including prescriptions and follow-up appointment information.  Efraim Kaufmann

## 2012-02-07 ENCOUNTER — Ambulatory Visit: Payer: No Typology Code available for payment source | Admitting: Orthopedic Surgery

## 2012-02-21 ENCOUNTER — Encounter: Payer: Self-pay | Admitting: Orthopedic Surgery

## 2012-02-21 ENCOUNTER — Ambulatory Visit: Payer: No Typology Code available for payment source | Admitting: Orthopedic Surgery

## 2012-03-06 ENCOUNTER — Other Ambulatory Visit (HOSPITAL_COMMUNITY): Payer: Self-pay | Admitting: Urology

## 2012-03-06 DIAGNOSIS — D49519 Neoplasm of unspecified behavior of unspecified kidney: Secondary | ICD-10-CM

## 2012-03-10 ENCOUNTER — Ambulatory Visit (HOSPITAL_COMMUNITY)
Admission: RE | Admit: 2012-03-10 | Discharge: 2012-03-10 | Disposition: A | Discharge status: Home / Self Care | Payer: No Typology Code available for payment source | Source: Ambulatory Visit | Attending: Urology | Admitting: Urology

## 2012-03-10 DIAGNOSIS — N289 Disorder of kidney and ureter, unspecified: Secondary | ICD-10-CM | POA: Insufficient documentation

## 2012-03-10 DIAGNOSIS — D49519 Neoplasm of unspecified behavior of unspecified kidney: Secondary | ICD-10-CM

## 2012-03-10 DIAGNOSIS — Q619 Cystic kidney disease, unspecified: Secondary | ICD-10-CM | POA: Insufficient documentation

## 2012-03-10 LAB — CREATININE, SERUM
Creatinine, Ser: 0.78 mg/dL (ref 0.50–1.35)
GFR calc Af Amer: >90 mL/min (ref >90)
GFR calc non Af Amer: >90 mL/min (ref >90)

## 2012-03-10 MED ORDER — GADOBENATE DIMEGLUMINE 529 MG/ML IV SOLN
20.0000 mL | Freq: Once | INTRAVENOUS | Status: AC | PRN
  Administered 2012-03-10: 20 mL via INTRAVENOUS

## 2012-04-10 ENCOUNTER — Ambulatory Visit: Payer: No Typology Code available for payment source | Admitting: Cardiovascular Disease

## 2012-10-18 ENCOUNTER — Other Ambulatory Visit: Payer: Self-pay | Admitting: Family Medicine

## 2012-10-18 DIAGNOSIS — R7989 Other specified abnormal findings of blood chemistry: Secondary | ICD-10-CM

## 2012-10-19 ENCOUNTER — Other Ambulatory Visit: Payer: Self-pay | Admitting: Family Medicine

## 2012-10-19 ENCOUNTER — Ambulatory Visit (HOSPITAL_COMMUNITY)
Admission: RE | Admit: 2012-10-19 | Discharge: 2012-10-19 | Disposition: A | Discharge status: Home / Self Care | Payer: No Typology Code available for payment source | Source: Ambulatory Visit | Attending: Family Medicine | Admitting: Family Medicine

## 2012-10-19 DIAGNOSIS — R7989 Other specified abnormal findings of blood chemistry: Secondary | ICD-10-CM

## 2012-10-19 DIAGNOSIS — R791 Abnormal coagulation profile: Secondary | ICD-10-CM | POA: Insufficient documentation

## 2012-10-19 DIAGNOSIS — Z86718 Personal history of other venous thrombosis and embolism: Secondary | ICD-10-CM | POA: Insufficient documentation

## 2012-10-19 DIAGNOSIS — M79609 Pain in unspecified limb: Secondary | ICD-10-CM | POA: Insufficient documentation

## 2012-10-19 DIAGNOSIS — M7989 Other specified soft tissue disorders: Secondary | ICD-10-CM | POA: Insufficient documentation

## 2012-10-26 ENCOUNTER — Ambulatory Visit (HOSPITAL_COMMUNITY): Payer: No Typology Code available for payment source | Attending: Family Medicine

## 2013-01-30 ENCOUNTER — Encounter: Payer: Self-pay | Admitting: *Deleted

## 2013-01-31 ENCOUNTER — Ambulatory Visit (INDEPENDENT_AMBULATORY_CARE_PROVIDER_SITE_OTHER): Payer: Medicare (Managed Care) | Admitting: Family Medicine

## 2013-01-31 ENCOUNTER — Encounter: Payer: Self-pay | Admitting: Family Medicine

## 2013-01-31 VITALS — BP 140/90 | HR 70 | Ht 71.75 in | Wt 265.2 lb

## 2013-01-31 DIAGNOSIS — Z79899 Other long term (current) drug therapy: Secondary | ICD-10-CM

## 2013-01-31 DIAGNOSIS — I10 Essential (primary) hypertension: Secondary | ICD-10-CM

## 2013-01-31 DIAGNOSIS — E039 Hypothyroidism, unspecified: Secondary | ICD-10-CM

## 2013-01-31 DIAGNOSIS — Z125 Encounter for screening for malignant neoplasm of prostate: Secondary | ICD-10-CM

## 2013-01-31 MED ORDER — HYDROCHLOROTHIAZIDE 25 MG PO TABS: 25.0000 mg | ORAL_TABLET | Freq: Every day | ORAL | Status: DC

## 2013-01-31 MED ORDER — POTASSIUM CHLORIDE ER 10 MEQ PO CPCR: 10.0000 meq | ORAL_CAPSULE | Freq: Two times a day (BID) | ORAL | Status: DC

## 2013-01-31 NOTE — Progress Notes (Signed)
  Subjective:    Patient ID: [name], male    DOB: 1951-02-03, 62 y.o.   MRN: [id number]  Hypertension This is a chronic problem. The current episode started more than 1 month ago. The problem has been gradually worsening since onset. The problem is uncontrolled. (Light-headed) There are no associated agents to hypertension. There are no known risk factors for coronary artery disease. The current treatment provides moderate improvement. There are no compliance problems.    This patient denies seen chest pressure tightness pain shortness of breath or swelling in the legs he does state his weight has gone up dramatically over the past several months because he's not been as active as normal and his he keeps a pretty robust appetite past medical history family history social history all reviewed.   Review of Systemssee above     Objective:   Physical Exam Blood pressures taken twice in both arms and both times was 140/88 and 140/92 lungs clear no crackles heart is regular pulses normal abdomen is soft extremities no edema       Assessment & Plan:  HTN-hydrochlorothiazide 25 mg every morning, potassium 10 any Q. Every morning also recommend followup in approximately 4 weeks check blood pressure again if any particular problems let us know watch portions and minimize calories decrease weight.

## 2013-01-31 NOTE — Patient Instructions (Signed)
Use hydrochlorothiazide each morning for your blood pressure also take potassium with it. Followup in 4 weeks to recheck blood pressure. Also do your labwork as directed.  Try to work hard on cutting back on portions and calories.

## 2013-07-02 ENCOUNTER — Encounter: Payer: Self-pay | Admitting: Family Medicine

## 2013-07-02 ENCOUNTER — Ambulatory Visit (INDEPENDENT_AMBULATORY_CARE_PROVIDER_SITE_OTHER): Payer: Medicare (Managed Care) | Admitting: Family Medicine

## 2013-07-02 VITALS — BP 112/80 | Temp 97.9°F | Ht 73.0 in | Wt 272.2 lb

## 2013-07-02 DIAGNOSIS — I2510 Atherosclerotic heart disease of native coronary artery without angina pectoris: Secondary | ICD-10-CM

## 2013-07-02 DIAGNOSIS — R7989 Other specified abnormal findings of blood chemistry: Secondary | ICD-10-CM

## 2013-07-02 DIAGNOSIS — Z125 Encounter for screening for malignant neoplasm of prostate: Secondary | ICD-10-CM

## 2013-07-02 DIAGNOSIS — R5381 Other malaise: Secondary | ICD-10-CM

## 2013-07-02 DIAGNOSIS — Z79899 Other long term (current) drug therapy: Secondary | ICD-10-CM

## 2013-07-02 DIAGNOSIS — R7309 Other abnormal glucose: Secondary | ICD-10-CM

## 2013-07-02 DIAGNOSIS — E782 Mixed hyperlipidemia: Secondary | ICD-10-CM

## 2013-07-02 DIAGNOSIS — R739 Hyperglycemia, unspecified: Secondary | ICD-10-CM

## 2013-07-02 DIAGNOSIS — R946 Abnormal results of thyroid function studies: Secondary | ICD-10-CM

## 2013-07-02 MED ORDER — CLINDAMYCIN HCL 300 MG PO CAPS: 300.0000 mg | ORAL_CAPSULE | Freq: Three times a day (TID) | ORAL | Status: DC

## 2013-07-02 NOTE — Progress Notes (Signed)
  Subjective:    Patient ID: [name], male    DOB: Sep 11, 1951, 62 y.o.   MRN: [id number]  HPI Patient is here today because his face has been swelling on the right side. He states that it started on Friday. He has no other concerns.   Patient relates that that area became tender painful he states he does have a history of a toothache are broken off into the right sinus. She didn't think that was causing it but he wasn't certain. He does relate no fevers no vomiting just mild headache. Patient also has a history of hypothyroidism, hypertension, chronic pain, heart disease, hyperlipidemia he has not followed up with specialist and months and has not followed up here either. He is behind on lab testing Social does not smoke he is unable to exercise because of chronic pain and orthopedic and back problems  Review of Systems He denies any chest tightness pressure pain shortness of breath denies her legs. Denies nausea vomiting diarrhea no fevers.    Objective:   Physical Exam Ears are normal throat is normal moderate right cheek redness cellulitis no abscess felt firmness is consistent with cellulitis Lungs are clear no crackles heart is regular no murmurs pulses are normal extremities no significant edema skin warm dry       Assessment & Plan:  #1 history of heart disease hypothyroidism hyperlipidemia-check lab work. May need adjustments on medications #2 patient with polyuria I am concerned about possibility of diabetes. #3 facial cellulitis clindamycin 3 times a day for the next 7-14 days if progressively worse referral to ENT patient will let us

## 2013-07-05 ENCOUNTER — Other Ambulatory Visit: Payer: Self-pay | Admitting: Family Medicine

## 2013-07-05 LAB — CBC WITH DIFFERENTIAL/PLATELET
Basophils Relative: 1 % (ref 0–1)
Eosinophils Absolute: 0.2 10*3/uL (ref 0.0–0.7)
MCH: 28.9 pg (ref 26.0–34.0)
MCHC: 33.7 g/dL (ref 30.0–36.0)
Monocytes Relative: 8 % (ref 3–12)
Neutrophils Relative %: 61 % (ref 43–77)
Platelets: 115 10*3/uL — ABNORMAL LOW (ref 150–400)
RDW: 14.3 % (ref 11.5–15.5)

## 2013-07-05 LAB — HEMOGLOBIN A1C: Hgb A1c MFr Bld: 5.5 % (ref <5.7)

## 2013-07-06 LAB — BASIC METABOLIC PANEL
Calcium: 9 mg/dL (ref 8.4–10.5)
Glucose, Bld: 91 mg/dL (ref 70–99)
Sodium: 141 mEq/L (ref 135–145)

## 2013-07-06 LAB — HEPATIC FUNCTION PANEL
ALT: 11 U/L (ref 0–53)
AST: 13 U/L (ref 0–37)
Bilirubin, Direct: 0.1 mg/dL (ref 0.0–0.3)

## 2013-07-06 LAB — LIPID PANEL
Cholesterol: 212 mg/dL — ABNORMAL HIGH (ref 0–200)
Total CHOL/HDL Ratio: 5 Ratio

## 2013-07-09 ENCOUNTER — Other Ambulatory Visit: Payer: Self-pay

## 2013-07-09 MED ORDER — ATORVASTATIN CALCIUM 80 MG PO TABS: 80.0000 mg | ORAL_TABLET | Freq: Every day | ORAL | Status: DC

## 2013-08-29 ENCOUNTER — Ambulatory Visit (INDEPENDENT_AMBULATORY_CARE_PROVIDER_SITE_OTHER): Payer: Medicare (Managed Care)

## 2013-08-29 DIAGNOSIS — Z23 Encounter for immunization: Secondary | ICD-10-CM

## 2013-09-15 ENCOUNTER — Emergency Department (HOSPITAL_COMMUNITY): Payer: No Typology Code available for payment source

## 2013-09-15 ENCOUNTER — Emergency Department (HOSPITAL_COMMUNITY)
Admission: EM | Admit: 2013-09-15 | Discharge: 2013-09-15 | Disposition: A | Discharge status: Home / Self Care | Payer: No Typology Code available for payment source | Attending: Emergency Medicine | Admitting: Emergency Medicine

## 2013-09-15 ENCOUNTER — Encounter (HOSPITAL_COMMUNITY): Payer: Self-pay | Admitting: Emergency Medicine

## 2013-09-15 DIAGNOSIS — J159 Unspecified bacterial pneumonia: Secondary | ICD-10-CM | POA: Insufficient documentation

## 2013-09-15 DIAGNOSIS — R0602 Shortness of breath: Secondary | ICD-10-CM | POA: Insufficient documentation

## 2013-09-15 DIAGNOSIS — Z880 Allergy status to penicillin: Secondary | ICD-10-CM | POA: Insufficient documentation

## 2013-09-15 DIAGNOSIS — Z862 Personal history of diseases of the blood and blood-forming organs and certain disorders involving the immune mechanism: Secondary | ICD-10-CM | POA: Insufficient documentation

## 2013-09-15 DIAGNOSIS — K219 Gastro-esophageal reflux disease without esophagitis: Secondary | ICD-10-CM | POA: Insufficient documentation

## 2013-09-15 DIAGNOSIS — Z9861 Coronary angioplasty status: Secondary | ICD-10-CM | POA: Insufficient documentation

## 2013-09-15 DIAGNOSIS — Z7982 Long term (current) use of aspirin: Secondary | ICD-10-CM | POA: Insufficient documentation

## 2013-09-15 DIAGNOSIS — I10 Essential (primary) hypertension: Secondary | ICD-10-CM | POA: Insufficient documentation

## 2013-09-15 DIAGNOSIS — E7800 Pure hypercholesterolemia, unspecified: Secondary | ICD-10-CM | POA: Insufficient documentation

## 2013-09-15 DIAGNOSIS — Z8639 Personal history of other endocrine, nutritional and metabolic disease: Secondary | ICD-10-CM | POA: Insufficient documentation

## 2013-09-15 DIAGNOSIS — I252 Old myocardial infarction: Secondary | ICD-10-CM | POA: Insufficient documentation

## 2013-09-15 DIAGNOSIS — I2510 Atherosclerotic heart disease of native coronary artery without angina pectoris: Secondary | ICD-10-CM | POA: Insufficient documentation

## 2013-09-15 DIAGNOSIS — Z79899 Other long term (current) drug therapy: Secondary | ICD-10-CM | POA: Insufficient documentation

## 2013-09-15 DIAGNOSIS — Z8669 Personal history of other diseases of the nervous system and sense organs: Secondary | ICD-10-CM | POA: Insufficient documentation

## 2013-09-15 DIAGNOSIS — J189 Pneumonia, unspecified organism: Secondary | ICD-10-CM

## 2013-09-15 DIAGNOSIS — Z87891 Personal history of nicotine dependence: Secondary | ICD-10-CM | POA: Insufficient documentation

## 2013-09-15 LAB — CBC
HCT: 41.4 % (ref 39.0–52.0)
Hemoglobin: 14.1 g/dL (ref 13.0–17.0)
MCH: 30.4 pg (ref 26.0–34.0)
MCHC: 34.1 g/dL (ref 30.0–36.0)
MCV: 89.2 fL (ref 78.0–100.0)
RBC: 4.64 MIL/uL (ref 4.22–5.81)
WBC: 7.4 10*3/uL (ref 4.0–10.5)

## 2013-09-15 LAB — BASIC METABOLIC PANEL
BUN: 10 mg/dL (ref 6–23)
CO2: 28 mEq/L (ref 19–32)
Calcium: 9.6 mg/dL (ref 8.4–10.5)
GFR calc non Af Amer: >90 mL/min (ref >90)
Glucose, Bld: 114 mg/dL — ABNORMAL HIGH (ref 70–99)
Sodium: 138 mEq/L (ref 135–145)

## 2013-09-15 LAB — POCT I-STAT TROPONIN I: Troponin i, poc: 0 ng/mL (ref 0.00–0.08)

## 2013-09-15 MED ORDER — METHYLPREDNISOLONE SODIUM SUCC 125 MG IJ SOLR
125.0000 mg | Freq: Once | INTRAMUSCULAR | Status: AC
  Administered 2013-09-15: 125 mg via INTRAVENOUS
  Filled 2013-09-15: qty 2

## 2013-09-15 MED ORDER — LEVOFLOXACIN 500 MG PO TABS: 500.0000 mg | ORAL_TABLET | Freq: Every day | ORAL | Status: DC

## 2013-09-15 MED ORDER — LEVOFLOXACIN IN D5W 750 MG/150ML IV SOLN
750.0000 mg | Freq: Once | INTRAVENOUS | Status: AC
  Administered 2013-09-15: 750 mg via INTRAVENOUS
  Filled 2013-09-15: qty 150

## 2013-09-15 MED ORDER — PREDNISONE 20 MG PO TABS: 60.0000 mg | ORAL_TABLET | Freq: Every day | ORAL | Status: DC

## 2013-09-15 MED ORDER — ALBUTEROL SULFATE HFA 108 (90 BASE) MCG/ACT IN AERS
2.0000 | INHALATION_SPRAY | RESPIRATORY_TRACT | Status: DC | PRN
  Administered 2013-09-15: 2 via RESPIRATORY_TRACT
  Filled 2013-09-15: qty 6.7

## 2013-09-15 MED ORDER — ALBUTEROL SULFATE (5 MG/ML) 0.5% IN NEBU
5.0000 mg | INHALATION_SOLUTION | Freq: Once | RESPIRATORY_TRACT | Status: AC
  Administered 2013-09-15: 5 mg via RESPIRATORY_TRACT
  Filled 2013-09-15: qty 1

## 2013-09-15 NOTE — ED Notes (Signed)
Pt ambulated around pod a with pulse ox on.  O2 sat dropped to 88% for < 5 seconds, then went back up to 97 and remained there.  Pt reported he does not ambulate at home usually, he sits in a chair and then lays all due to his back complications.

## 2013-09-15 NOTE — ED Notes (Signed)
Reports breathing treatment appears to have helped.

## 2013-09-15 NOTE — ED Notes (Signed)
The pt has had a cold and sob since Wednesday.  Now he is unable to lie down  Flat and breathe well

## 2013-09-15 NOTE — ED Provider Notes (Signed)
CSN: [id number]     Arrival date & time 09/15/13  0046 History   First MD Initiated Contact with Patient 09/15/13 0110     Chief Complaint  Patient presents with  . Shortness of Breath   (Consider location/radiation/quality/duration/timing/severity/associated sxs/prior Treatment) HPI Hx per PT - not feeling well for the last 2 days with cough, cold and congestion. He has some productive sputum without hemoptysis. No fevers or chills. Tonight unable to sleep due to the symptoms and increasing dyspnea. No chest pain or chest tightness. Granddaughter was recently sick with a cold.  Leg pain or leg swelling. Symptoms moderate severity. No sore throat. No rash. No recent travel. Past Medical History  Diagnosis Date  . Hypertension   . Coronary artery disease   . Myocardial infarction   . Chronic back pain   . Hypercholesteremia   . GERD (gastroesophageal reflux disease)   . Allergy   . Lumbar pain   . Hypothyroid   . Carpal tunnel syndrome    Past Surgical History  Procedure Laterality Date  . Coronary angioplasty with stent placement    . Back surgery    . Neck surgery    . Lumbar fusion  2009  . Colonoscopy     Family History  Problem Relation Age of Onset  . Heart attack Father    History  Substance Use Topics  . Smoking status: Former Smoker    Types: Cigarettes    Quit date: 07/06/1991  . Smokeless tobacco: Never Used  . Alcohol Use: No    Review of Systems  Constitutional: Negative for fever and chills.  HENT: Negative for sore throat.   Respiratory: Positive for cough and shortness of breath.   Cardiovascular: Negative for chest pain.  Gastrointestinal: Negative for vomiting and abdominal pain.  Genitourinary: Negative for flank pain.  Musculoskeletal: Negative for back pain, neck pain and neck stiffness.  Skin: Negative for rash.  Neurological: Negative for headaches.  All other systems reviewed and are negative.    Allergies  Penicillins; Sulfa  antibiotics; and Zoloft  Home Medications   Current Outpatient Rx  Name  Route  Sig  Dispense  Refill  . aspirin EC 81 MG tablet   Oral   Take 81 mg by mouth daily.         Marland Kitchen atorvastatin (LIPITOR) 80 MG tablet   Oral   Take 1 tablet (80 mg total) by mouth daily.   30 tablet   5   . cetirizine (ZYRTEC) 10 MG tablet   Oral   Take 10 mg by mouth at bedtime.           . citalopram (CELEXA) 20 MG tablet   Oral   Take 10 mg by mouth every morning.           Marland Kitchen doxazosin (CARDURA) 4 MG tablet   Oral   Take 4 mg by mouth every morning.           . hydrochlorothiazide (HYDRODIURIL) 25 MG tablet   Oral   Take 1 tablet (25 mg total) by mouth daily.   30 tablet   4   . morphine (AVINZA) 90 MG 24 hr capsule   Oral   Take 90 mg by mouth. 2 tablets qhs         . Multiple Vitamin (MULTIVITAMIN) capsule   Oral   Take 1 capsule by mouth daily.           . nadolol (CORGARD) 40 MG  tablet   Oral   Take 40 mg by mouth at bedtime.           Marland Kitchen EXPIRED: nitroGLYCERIN (NITROSTAT) 0.4 MG SL tablet   Sublingual   Place 1 tablet (0.4 mg total) under the tongue every 5 (five) minutes x 3 doses as needed for chest pain (up to 3 doses).   25 tablet   3   . omeprazole (PRILOSEC) 20 MG capsule   Oral   Take 20 mg by mouth 2 (two) times daily.           . potassium chloride (MICRO-K) 10 MEQ CR capsule   Oral   Take 1 capsule (10 mEq total) by mouth 2 (two) times daily.   30 capsule   4     May take with hctz, please deliver meds   . prasugrel (EFFIENT) 10 MG TABS   Oral   Take 10 mg by mouth.           . simvastatin (ZOCOR) 40 MG tablet   Oral   Take 40 mg by mouth at bedtime.            BP 182/93  Pulse 103  Temp(Src) 98.9 F (37.2 C) (Oral)  Resp 18  SpO2 96% Physical Exam  Constitutional: He is oriented to person, place, and time. He appears well-developed and well-nourished.  HENT:  Head: Normocephalic and atraumatic.  Mouth/Throat: Oropharynx  is clear and moist.  Eyes: Conjunctivae and EOM are normal. Pupils are equal, round, and reactive to light.  Neck: Trachea normal. Neck supple. No thyromegaly present.  Cardiovascular: Normal rate, regular rhythm, S1 normal, S2 normal and normal pulses.     No systolic murmur is present   No diastolic murmur is present  Pulses:      Radial pulses are 2+ on the right side, and 2+ on the left side.  Pulmonary/Chest: Effort normal. He has no rhonchi. He exhibits no tenderness.  Mildly coarse bilateral breath sounds  Abdominal: Soft. Normal appearance and bowel sounds are normal. There is no tenderness. There is no CVA tenderness and negative Murphy's sign.  Musculoskeletal: He exhibits no edema and no tenderness.  Neurological: He is alert and oriented to person, place, and time. He has normal strength. No cranial nerve deficit or sensory deficit. GCS eye subscore is 4. GCS verbal subscore is 5. GCS motor subscore is 6.  Skin: Skin is warm and dry. No rash noted. He is not diaphoretic.  Psychiatric: His speech is normal.  Cooperative and appropriate    ED Course  Procedures (including critical care time)  Room air pulse ox 96 - 100% is adequate  Labs Review Labs Reviewed  CBC - Abnormal; Notable for the following:    Platelets 124 (*)    All other components within normal limits  BASIC METABOLIC PANEL - Abnormal; Notable for the following:    Glucose, Bld 114 (*)    All other components within normal limits  PRO B NATRIURETIC PEPTIDE - Abnormal; Notable for the following:    Pro B Natriuretic peptide (BNP) 436.3 (*)    All other components within normal limits  POCT I-STAT TROPONIN I   Imaging Review Dg Chest 2 View  09/15/2013   CLINICAL DATA:  Shortness of breath.  EXAM: CHEST  2 VIEW  COMPARISON:  Chest radiograph performed 12/31/2011, and CTA of the chest performed 01/01/2012  FINDINGS: The lungs are well-aerated. Mild right basilar opacity is not well characterized on the  lateral view but may may reflect atelectasis or possibly mild pneumonia. An accessory azygos lobe is noted. No pleural effusion or pneumothorax is seen.  The heart is borderline normal in size; the mediastinal contour is within normal limits. No acute osseous abnormalities are seen. Cervical spinal fusion hardware is noted.  IMPRESSION: Mild right basilar airspace opacity, not well characterized on the lateral view, may reflect atelectasis or possibly mild pneumonia.   Electronically Signed   By: Roanna Raider M.D.   On: 09/15/2013 02:26    EKG Interpretation     Ventricular Rate:  103 PR Interval:  176 QRS Duration: 102 QT Interval:  350 QTC Calculation: 458 R Axis:     Text Interpretation:  Sinus tachycardia with frequent Premature ventricular complexes Nonspecific ST abnormality Abnormal ECG           Albuterol treatment provided. IV steroids. IV antibiotics.  4:05 AM on recheck, ambulates in the ER, feels sig better and wants to go home. he agrees to strict return precautions. Plan followup primary care physician or return here for difficulty breathing, fevers or worsening condition. Prescription for Levaquin and prednisone - albuterol inhaler given in the ED  MDM  Diagnoses: Community acquired pneumonia  EKG, chest x-ray, labs Approved with albuterol treatment and steroids Antibiotics provided Vital signs nurse's notes reviewed and considered    Sunnie Nielsen, MD 09/15/13 [phone]

## 2013-09-16 ENCOUNTER — Encounter: Payer: Self-pay | Admitting: Family Medicine

## 2013-09-16 ENCOUNTER — Ambulatory Visit (INDEPENDENT_AMBULATORY_CARE_PROVIDER_SITE_OTHER): Payer: Medicare (Managed Care) | Admitting: Family Medicine

## 2013-09-16 VITALS — BP 160/90 | Temp 98.4°F | Ht 70.0 in | Wt 271.6 lb

## 2013-09-16 DIAGNOSIS — J189 Pneumonia, unspecified organism: Secondary | ICD-10-CM

## 2013-09-16 DIAGNOSIS — I10 Essential (primary) hypertension: Secondary | ICD-10-CM

## 2013-09-16 MED ORDER — CIPROFLOXACIN HCL 500 MG PO TABS: 500.0000 mg | ORAL_TABLET | Freq: Two times a day (BID) | ORAL | Status: DC

## 2013-09-16 MED ORDER — HYDROCHLOROTHIAZIDE 25 MG PO TABS: 25.0000 mg | ORAL_TABLET | Freq: Every day | ORAL | Status: DC

## 2013-09-16 NOTE — Progress Notes (Signed)
  Subjective:    Patient ID: [name], male    DOB: December 17, 1950, 62 y.o.   MRN: [id number]  HPI Patient is here today for a f/u from the ER on 11/16. He was diagnosed with community pneumonia.   He was given prednisone and antibiotics, but he said he could not afford to pick up the medications.   PMH heart disease chronic pain and discomfort  Review of Systems Denies high fever chills sweats nausea vomiting   him Objective:   Physical Exam Blood pressure initially up on prom recheck it was actually improved 140/86 Lungs bilateral cough noted but more congestion in the right base not rest for distress heart regular extremities no edema        Assessment & Plan:  Pneumonia-x-ray was reviewed. It does not appear severe. I would recommend Cipro 500 twice a day for 10 days he needs something he can afford along with that his difficult financial situation. Patient was talked to at length about the importance of watching for various signs of complications. Report back to [place] if ongoing issues.

## 2013-11-18 ENCOUNTER — Ambulatory Visit (INDEPENDENT_AMBULATORY_CARE_PROVIDER_SITE_OTHER): Payer: Medicare Other | Admitting: Family Medicine

## 2013-11-18 ENCOUNTER — Encounter: Payer: Self-pay | Admitting: Family Medicine

## 2013-11-18 VITALS — BP 122/88 | Temp 97.9°F | Ht 70.0 in | Wt 273.4 lb

## 2013-11-18 DIAGNOSIS — E785 Hyperlipidemia, unspecified: Secondary | ICD-10-CM

## 2013-11-18 DIAGNOSIS — Z79899 Other long term (current) drug therapy: Secondary | ICD-10-CM

## 2013-11-18 DIAGNOSIS — J019 Acute sinusitis, unspecified: Secondary | ICD-10-CM

## 2013-11-18 MED ORDER — CIPROFLOXACIN HCL 500 MG PO TABS: 500.0000 mg | ORAL_TABLET | Freq: Two times a day (BID) | ORAL | Status: DC

## 2013-11-18 NOTE — Progress Notes (Signed)
   Subjective:    Patient ID: [name], male    DOB: Aug 14, 1951, 63 y.o.   MRN: [id number]  Sinusitis This is a new problem. The current episode started in the past 7 days. The problem has been gradually worsening since onset. The pain is moderate. Associated symptoms include chills, congestion and coughing. Pertinent negatives include no ear pain. Past treatments include nothing. The treatment provided no relief.   Been present for the past 7 days, now in chest with deep cough. Moderate phlegm No fever, some chills Has a history of heart issues also chronic back pain Review of Systems  Constitutional: Positive for chills. Negative for fever and activity change.  HENT: Positive for congestion and rhinorrhea. Negative for ear pain.   Eyes: Negative for discharge.  Respiratory: Positive for cough. Negative for wheezing.   Cardiovascular: Negative for chest pain.       Objective:   Physical Exam  Nursing note and vitals reviewed. Constitutional: He appears well-developed.  HENT:  Head: Normocephalic.  Mouth/Throat: Oropharynx is clear and moist. No oropharyngeal exudate.  Neck: Normal range of motion.  Cardiovascular: Normal rate, regular rhythm and normal heart sounds.   No murmur heard. Pulmonary/Chest: Effort normal and breath sounds normal. He has no wheezes.  Lymphadenopathy:    He has no cervical adenopathy.  Neurological: He exhibits normal muscle tone.  Skin: Skin is warm and dry.          Assessment & Plan:  Viral syndrome with secondary sinusitis antibiotics prescribed warning signs discussed followup if ongoing troubles  History hyperlipidemia he will check lipid profile continue medication watch diet patient was also encouraged to call his cardiologist he ought to followup with them every 12-18 months

## 2014-01-14 ENCOUNTER — Ambulatory Visit (INDEPENDENT_AMBULATORY_CARE_PROVIDER_SITE_OTHER): Payer: Medicare Other | Admitting: Family Medicine

## 2014-01-14 ENCOUNTER — Encounter: Payer: Self-pay | Admitting: Family Medicine

## 2014-01-14 VITALS — BP 138/98 | Temp 99.0°F | Ht 71.0 in | Wt 267.0 lb

## 2014-01-14 DIAGNOSIS — R109 Unspecified abdominal pain: Secondary | ICD-10-CM

## 2014-01-14 DIAGNOSIS — K5732 Diverticulitis of large intestine without perforation or abscess without bleeding: Secondary | ICD-10-CM

## 2014-01-14 MED ORDER — DOXAZOSIN MESYLATE 4 MG PO TABS: 4.0000 mg | ORAL_TABLET | ORAL | Status: DC

## 2014-01-14 MED ORDER — LEVOTHYROXINE SODIUM 100 MCG PO TABS: 100.0000 ug | ORAL_TABLET | Freq: Every day | ORAL | Status: DC

## 2014-01-14 MED ORDER — NADOLOL 40 MG PO TABS: 40.0000 mg | ORAL_TABLET | Freq: Every day | ORAL | Status: DC

## 2014-01-14 MED ORDER — POTASSIUM CHLORIDE ER 10 MEQ PO TBCR: 10.0000 meq | EXTENDED_RELEASE_TABLET | Freq: Every day | ORAL | Status: DC

## 2014-01-14 MED ORDER — CIPROFLOXACIN HCL 750 MG PO TABS: 750.0000 mg | ORAL_TABLET | Freq: Two times a day (BID) | ORAL | Status: DC

## 2014-01-14 MED ORDER — CITALOPRAM HYDROBROMIDE 20 MG PO TABS: 20.0000 mg | ORAL_TABLET | ORAL | Status: DC

## 2014-01-14 MED ORDER — METRONIDAZOLE 500 MG PO TABS: 500.0000 mg | ORAL_TABLET | Freq: Three times a day (TID) | ORAL | Status: DC

## 2014-01-14 NOTE — Progress Notes (Signed)
   Subjective:    Patient ID: [name], male    DOB: November 30, 1950, 63 y.o.   MRN: [id number]  Abdominal Pain This is a new problem. The current episode started 1 to 4 weeks ago. The pain is located in the LLQ. The quality of the pain is burning, cramping and sharp. Associated symptoms include a fever. Associated symptoms comments: Loose stool. Exacerbated by: stress. Treatments tried: mylanta, tums, laxative,     Chills t thome, felt yucky and nausea  Burping frequently,  Last colonoscopy ten yrs ago  Pos hx of diverticulosis via colonoscopy.  Patient also notes loose stools and diminished energy.  Review of Systems  Constitutional: Positive for fever.  Gastrointestinal: Positive for abdominal pain.   positive nausea no vomiting. No blood per stool no new chest pain ROS otherwise negative.     Objective:   Physical Exam  Alert mild malaise. Vitals reviewed. Temp 99. HEENT normal. Lungs clear. Heart regular in rhythm. Abdomen good bowel sounds. Distinct left lower cord or tenderness.      Assessment & Plan:  Impression probable diverticulitis discussed at length plan Cipro twice a day metronidazole 3 times a day. Schedule scan followup here in 48 hours. Appropriate blood work. WSL

## 2014-01-15 ENCOUNTER — Encounter (HOSPITAL_COMMUNITY): Payer: Self-pay

## 2014-01-15 ENCOUNTER — Ambulatory Visit (HOSPITAL_COMMUNITY)
Admission: RE | Admit: 2014-01-15 | Discharge: 2014-01-15 | Disposition: A | Discharge status: Home / Self Care | Payer: Medicare Other | Source: Ambulatory Visit | Attending: Family Medicine | Admitting: Family Medicine

## 2014-01-15 DIAGNOSIS — Q618 Other cystic kidney diseases: Secondary | ICD-10-CM | POA: Insufficient documentation

## 2014-01-15 DIAGNOSIS — K5730 Diverticulosis of large intestine without perforation or abscess without bleeding: Secondary | ICD-10-CM | POA: Insufficient documentation

## 2014-01-15 DIAGNOSIS — R109 Unspecified abdominal pain: Secondary | ICD-10-CM | POA: Insufficient documentation

## 2014-01-15 LAB — CBC WITH DIFFERENTIAL/PLATELET
BASOS ABS: 0 10*3/uL (ref 0.0–0.1)
BASOS PCT: 1 % (ref 0–1)
EOS PCT: 8 % — AB (ref 0–5)
Eosinophils Absolute: 0.4 10*3/uL (ref 0.0–0.7)
HCT: 38.7 % — ABNORMAL LOW (ref 39.0–52.0)
Hemoglobin: 13.4 g/dL (ref 13.0–17.0)
LYMPHS PCT: 18 % (ref 12–46)
Lymphs Abs: 0.9 10*3/uL (ref 0.7–4.0)
MCH: 29.1 pg (ref 26.0–34.0)
MCHC: 34.6 g/dL (ref 30.0–36.0)
MCV: 84.1 fL (ref 78.0–100.0)
Monocytes Absolute: 0.4 10*3/uL (ref 0.1–1.0)
Monocytes Relative: 8 % (ref 3–12)
Neutro Abs: 3.2 10*3/uL (ref 1.7–7.7)
Neutrophils Relative %: 65 % (ref 43–77)
PLATELETS: 131 10*3/uL — AB (ref 150–400)
RBC: 4.6 MIL/uL (ref 4.22–5.81)
RDW: 14.4 % (ref 11.5–15.5)
WBC: 4.9 10*3/uL (ref 4.0–10.5)

## 2014-01-15 LAB — BASIC METABOLIC PANEL
BUN: 9 mg/dL (ref 6–23)
CHLORIDE: 102 meq/L (ref 96–112)
CO2: 31 mEq/L (ref 19–32)
Calcium: 8.7 mg/dL (ref 8.4–10.5)
Creat: 0.88 mg/dL (ref 0.50–1.35)
Glucose, Bld: 118 mg/dL — ABNORMAL HIGH (ref 70–99)
POTASSIUM: 4.2 meq/L (ref 3.5–5.3)
SODIUM: 138 meq/L (ref 135–145)

## 2014-01-15 LAB — HEPATIC FUNCTION PANEL
ALBUMIN: 4.3 g/dL (ref 3.5–5.2)
ALT: 12 U/L (ref 0–53)
AST: 13 U/L (ref 0–37)
Alkaline Phosphatase: 67 U/L (ref 39–117)
Bilirubin, Direct: 0.1 mg/dL (ref 0.0–0.3)
Indirect Bilirubin: 0.4 mg/dL (ref 0.2–1.2)
TOTAL PROTEIN: 6.4 g/dL (ref 6.0–8.3)
Total Bilirubin: 0.5 mg/dL (ref 0.2–1.2)

## 2014-01-15 LAB — POCT I-STAT CREATININE: Creatinine, Ser: 0.9 mg/dL (ref 0.50–1.35)

## 2014-01-15 MED ORDER — IOHEXOL 300 MG/ML  SOLN
100.0000 mL | Freq: Once | INTRAMUSCULAR | Status: AC | PRN
  Administered 2014-01-15: 100 mL via INTRAVENOUS

## 2014-01-16 ENCOUNTER — Encounter: Payer: Self-pay | Admitting: Family Medicine

## 2014-01-16 ENCOUNTER — Ambulatory Visit (INDEPENDENT_AMBULATORY_CARE_PROVIDER_SITE_OTHER): Payer: Medicare Other | Admitting: Family Medicine

## 2014-01-16 VITALS — BP 136/94 | Temp 98.3°F | Ht 71.0 in | Wt 267.0 lb

## 2014-01-16 DIAGNOSIS — R109 Unspecified abdominal pain: Secondary | ICD-10-CM

## 2014-01-16 NOTE — Progress Notes (Signed)
   Subjective:    Patient ID: [name], male    DOB: [birth year], 63 y.o.   MRN: [id number]  HPI Patient was seen here on Tues with Dr. Richardson Landry for abdominal pain.   He did lab work and had a CT scan. Patient relates left lower cord or abdominal pain discomfort over the past 2 weeks worse over the past few days he came in was seen lab work ordered CAT scan ordered. Did not show diverticulitis or abscess has continued lower abdominal pain discomfort but he states he's feeling better he also relates upper abdominal discomfort and dysphagia that's been present weeks. Patient is over due for colonoscopy  He said he still has abdominal pain, but it is getting better.  Pt is here to go over the results with you.   Labs and CT were reviewed with the patient Review of Systems    see above Objective:   Physical Exam Lungs clear heart regular abdomen mild epigastric tenderness significant left lower quadrant tenderness no guarding or rebound       Assessment & Plan:  Abdominal pain-referral to Dr. Laural Golden I believe this patient will need a colonoscopy and EGD and stretching in the esophagus finish antibiotics warning signs discussed

## 2014-01-21 ENCOUNTER — Encounter (INDEPENDENT_AMBULATORY_CARE_PROVIDER_SITE_OTHER): Payer: Self-pay | Admitting: *Deleted

## 2014-02-04 ENCOUNTER — Encounter (INDEPENDENT_AMBULATORY_CARE_PROVIDER_SITE_OTHER): Payer: Self-pay | Admitting: Internal Medicine

## 2014-02-04 ENCOUNTER — Telehealth (INDEPENDENT_AMBULATORY_CARE_PROVIDER_SITE_OTHER): Payer: Self-pay | Admitting: *Deleted

## 2014-02-04 ENCOUNTER — Other Ambulatory Visit (INDEPENDENT_AMBULATORY_CARE_PROVIDER_SITE_OTHER): Payer: Self-pay | Admitting: *Deleted

## 2014-02-04 ENCOUNTER — Ambulatory Visit (INDEPENDENT_AMBULATORY_CARE_PROVIDER_SITE_OTHER): Payer: Medicare Other | Admitting: Internal Medicine

## 2014-02-04 VITALS — BP 100/66 | HR 72 | Temp 97.8°F | Ht 71.0 in | Wt 263.2 lb

## 2014-02-04 DIAGNOSIS — R1310 Dysphagia, unspecified: Secondary | ICD-10-CM | POA: Insufficient documentation

## 2014-02-04 DIAGNOSIS — Z1211 Encounter for screening for malignant neoplasm of colon: Secondary | ICD-10-CM

## 2014-02-04 DIAGNOSIS — K5792 Diverticulitis of intestine, part unspecified, without perforation or abscess without bleeding: Secondary | ICD-10-CM

## 2014-02-04 DIAGNOSIS — K5732 Diverticulitis of large intestine without perforation or abscess without bleeding: Secondary | ICD-10-CM | POA: Insufficient documentation

## 2014-02-04 MED ORDER — PEG-KCL-NACL-NASULF-NA ASC-C 100 G PO SOLR: 1.0000 | Freq: Once | ORAL | Status: DC

## 2014-02-04 NOTE — Progress Notes (Signed)
Subjective:     Patient ID: [name], male   DOB: 1951/06/19, 63 y.o.   MRN: [id number]  HPI Referred to our office by Dr.Scott [name] for possible diverticulitis. Treated with Cipro and Flagyl x 2 weeks. Symptoms started 01-15-2014. Symptoms resolved. He says he ate very little with the bout of diverticulitis due to the pain No prior hx of diverticulitis.  He says he is 95% better. There is no lower abdominal pain. Appetite is good. He says he lost about 20 pounds with the episode of diverticulitis.  He is back to a regular diet and is gaining his weight back.  He usually has a BM 1 every 3-4 days due to taking Morphine for his chronic back pain. He takes Miralax for his constipation. No melena or bright red rectal bleeding. His last colonoscopy was greater than 10 yrs a go by Dr. Sharlett Iles which was normal. He tells me he is having dysphagia.  Meats and breads feel like they are lodging in his esophagus. He self induces vomiting to remove the bolus. He does drink water behind the bolus because the water lodges on top of the bolus.  Acid reflux controlled with Prilosec.  Family hx of colon cancer in a grandmother age 68. 1st cousin with colon cancer. Deceased age 37. Jan 15, 2014 CT abdomen/pelvis with CM: IMPRESSION:  Scattered sigmoid diverticula. No active diverticulitis.  Hepatic and renal cysts. Suspect mild diffuse fatty infiltration of  the liver. CBC    Component Value Date/Time   WBC 4.9 15-Jan-2014 0947   RBC 4.60 01/15/14 0947   HGB 13.4 2014-01-15 0947   HCT 38.7* 2014/01/15 0947   PLT 131* January 15, 2014 0947   MCV 84.1 01/15/14 0947   MCH 29.1 15-Jan-2014 0947   MCHC 34.6 01-15-14 0947   RDW 14.4 01-15-2014 0947   LYMPHSABS 0.9 01-15-2014 0947   MONOABS 0.4 01-15-14 0947   EOSABS 0.4 Jan 15, 2014 0947   BASOSABS 0.0 15-Jan-2014 0947   Hepatic Function Panel     Component Value Date/Time   PROT 6.4 Jan 15, 2014 0947   ALBUMIN 4.3 2014/01/15 0947   AST 13 01/15/2014 0947   ALT 12  2014/01/15 0947   ALKPHOS 67 January 15, 2014 0947   BILITOT 0.5 15-Jan-2014 0947   BILIDIR 0.1 01/15/14 0947   IBILI 0.4 01-15-14 0947       Review of Systems Past Medical History  Diagnosis Date  . Hypertension   . Coronary artery disease   . Myocardial infarction   . Chronic back pain   . Hypercholesteremia   . GERD (gastroesophageal reflux disease)   . Allergy   . Lumbar pain   . Hypothyroid   . Carpal tunnel syndrome     Past Surgical History  Procedure Laterality Date  . Coronary angioplasty with stent placement      2000, and 2004  . Back surgery    . Neck surgery    . Lumbar fusion  2009  . Colonoscopy      Allergies  Allergen Reactions  . Penicillins Other (See Comments)    "I passed out last time I had it and they told me not to take it again."  . Sulfa Antibiotics Swelling  . Zoloft [Sertraline Hcl] Other (See Comments)    Confusion     Current Outpatient Prescriptions on File Prior to Visit  Medication Sig Dispense Refill  . albuterol (PROVENTIL HFA;VENTOLIN HFA) 108 (90 BASE) MCG/ACT inhaler Inhale into the lungs every 6 (six) hours as needed for  wheezing or shortness of breath.      Marland Kitchen aspirin EC 81 MG tablet Take 81 mg by mouth daily.      Marland Kitchen atorvastatin (LIPITOR) 80 MG tablet Take 1 tablet (80 mg total) by mouth daily.  30 tablet  5  . cetirizine (ZYRTEC) 10 MG tablet Take 10 mg by mouth at bedtime.        . citalopram (CELEXA) 20 MG tablet Take 1 tablet (20 mg total) by mouth every morning.  90 tablet  1  . doxazosin (CARDURA) 4 MG tablet Take 1 tablet (4 mg total) by mouth every morning.  90 tablet  1  . hydrochlorothiazide (HYDRODIURIL) 25 MG tablet Take 1 tablet (25 mg total) by mouth daily.  90 tablet  4  . levothyroxine (SYNTHROID, LEVOTHROID) 100 MCG tablet Take 1 tablet (100 mcg total) by mouth daily before breakfast.  90 tablet  1  . morphine (MS CONTIN) 60 MG 12 hr tablet Take 60 mg by mouth every 8 (eight) hours. Scheduled doses for pain       . morphine (MSIR) 15 MG tablet Take 15 mg by mouth every 6 (six) hours as needed for severe pain.      . Multiple Vitamin (MULTIVITAMIN) capsule Take 1 capsule by mouth daily.        . nadolol (CORGARD) 40 MG tablet Take 1 tablet (40 mg total) by mouth at bedtime.  90 tablet  1  . omeprazole (PRILOSEC) 20 MG capsule Take 20 mg by mouth 2 (two) times daily.        . potassium chloride (K-DUR) 10 MEQ tablet Take 1 tablet (10 mEq total) by mouth daily.  90 tablet  1  . metroNIDAZOLE (FLAGYL) 500 MG tablet Take 1 tablet (500 mg total) by mouth 3 (three) times daily.  30 tablet  0  . nitroGLYCERIN (NITROSTAT) 0.4 MG SL tablet Place 1 tablet (0.4 mg total) under the tongue every 5 (five) minutes x 3 doses as needed for chest pain (up to 3 doses).  25 tablet  3   No current facility-administered medications on file prior to visit.        Objective:   Physical Exam  Filed Vitals:   02/04/14 1106  BP: 100/66  Pulse: 72  Temp: 97.8 F (36.6 C)  Height: 5\' 11"  (1.803 m)  Weight: 263 lb 3.2 oz (119.387 kg)   Alert and oriented. Skin warm and dry. Oral mucosa is moist.   . Sclera anicteric, conjunctivae is pink. Thyroid not enlarged. No cervical lymphadenopathy. Lungs clear. Heart regular rate and rhythm.  Abdomen is soft. Bowel sounds are positive. No hepatomegaly. No abdominal masses felt. No tenderness.  No edema to lower extremities.       Assessment:    Dysphagia to solids. Stricture needs to be ruled out. Recent hx of diverticulitis. Underlying colon cancer needs to be ruled out.    Plan:    EGD/ED and Colonoscopy. The risks and benefits such as perforation, bleeding, and infection were reviewed with the patient and is agreeable.

## 2014-02-04 NOTE — Telephone Encounter (Signed)
Patient needs movi prep 

## 2014-02-04 NOTE — Patient Instructions (Signed)
EGD/ED, Colonoscopy. -The risks and benefits such as perforation, bleeding, and infection were reviewed with the patient and is agreeable. 

## 2014-03-03 ENCOUNTER — Encounter (HOSPITAL_COMMUNITY): Payer: Self-pay | Admitting: Pharmacy Technician

## 2014-03-03 ENCOUNTER — Encounter (HOSPITAL_COMMUNITY): Payer: Self-pay

## 2014-03-03 ENCOUNTER — Encounter (HOSPITAL_COMMUNITY)
Admission: RE | Admit: 2014-03-03 | Discharge: 2014-03-03 | Disposition: A | Discharge status: Home / Self Care | Payer: Medicare Other | Source: Ambulatory Visit | Attending: Internal Medicine | Admitting: Internal Medicine

## 2014-03-03 VITALS — BP 165/91 | HR 79 | Temp 97.3°F | Resp 18 | Ht 73.0 in | Wt 261.6 lb

## 2014-03-03 DIAGNOSIS — R1310 Dysphagia, unspecified: Secondary | ICD-10-CM

## 2014-03-03 DIAGNOSIS — K5792 Diverticulitis of intestine, part unspecified, without perforation or abscess without bleeding: Secondary | ICD-10-CM

## 2014-03-03 HISTORY — DX: Reserved for concepts with insufficient information to code with codable children: IMO0002

## 2014-03-03 HISTORY — DX: Personal history of diseases of the blood and blood-forming organs and certain disorders involving the immune mechanism: Z86.2

## 2014-03-03 NOTE — Patient Instructions (Signed)
[name]  03/03/2014   Your procedure is scheduled on:   03/06/2014  Report to [facility] at  7  AM.  Call this number if you have problems the morning of surgery: [phone]   Remember:   Do not eat food or drink liquids after midnight.   Take these medicines the morning of surgery with A SIP OF WATER:  Zyrtec, celexa, cardura, synthroid, MS Contin, nadolol, prilosec. Take your proventil before you come.   Do not wear jewelry, make-up or nail polish.  Do not wear lotions, powders, or perfumes.   Do not shave 48 hours prior to surgery. Men may shave face and neck.  Do not bring valuables to the hospital.  [facility] is not responsible for any belongings or valuables.               Contacts, dentures or bridgework may not be worn into surgery.  Leave suitcase in the car. After surgery it may be brought to your room.  For patients admitted to the hospital, discharge time is determined by your treatment team.               Patients discharged the day of surgery will not be allowed to drive home.  Name and phone number of your driver: family  Special Instructions: N/A   Please read over the following fact sheets that you were given: Pain Booklet, Coughing and Deep Breathing, Surgical Site Infection Prevention, Anesthesia Post-op Instructions and Care and Recovery After Surgery Colonoscopy A colonoscopy is an exam to look at the entire large intestine (colon). This exam can help find problems such as tumors, polyps, inflammation, and areas of bleeding. The exam takes about 1 hour.  LET [facility] CARE PROVIDER KNOW ABOUT:   Any allergies you have.  All medicines you are taking, including vitamins, herbs, eye drops, creams, and over-the-counter medicines.  Previous problems you or members of your family have had with the use of anesthetics.  Any blood disorders you have.  Previous surgeries you have had.  Medical conditions you have. RISKS AND COMPLICATIONS    Generally, this is a safe procedure. However, as with any procedure, complications can occur. Possible complications include:  Bleeding.  Tearing or rupture of the colon wall.  Reaction to medicines given during the exam.  Infection (rare). BEFORE THE PROCEDURE   Ask your health care provider about changing or stopping your regular medicines.  You may be prescribed an oral bowel prep. This involves drinking a large amount of medicated liquid, starting the day before your procedure. The liquid will cause you to have multiple loose stools until your stool is almost clear or light green. This cleans out your colon in preparation for the procedure.  Do not eat or drink anything else once you have started the bowel prep, unless your health care provider tells you it is safe to do so.  Arrange for someone to drive you home after the procedure. PROCEDURE   You will be given medicine to help you relax (sedative).  You will lie on your side with your knees bent.  A long, flexible tube with a light and camera on the end (colonoscope) will be inserted through the rectum and into the colon. The camera sends video back to a computer screen as it moves through the colon. The colonoscope also releases carbon dioxide gas to inflate the colon. This helps your health care provider see the  area better.  During the exam, your health care provider may take a small tissue sample (biopsy) to be examined under a microscope if any abnormalities are found.  The exam is finished when the entire colon has been viewed. AFTER THE PROCEDURE   Do not drive for 24 hours after the exam.  You may have a small amount of blood in your stool.  You may pass moderate amounts of gas and have mild abdominal cramping or bloating. This is caused by the gas used to inflate your colon during the exam.  Ask when your test results will be ready and how you will get your results. Make sure you get your test results. Document  Released: 10/14/2000 Document Revised: 08/07/2013 Document Reviewed: 06/24/2013 [facility] Patient Information 2014 [place]. Esophageal Dilatation The esophagus is the long, narrow tube which carries food and liquid from the mouth to the stomach. Esophageal dilatation is the technique used to stretch a blocked or narrowed portion of the esophagus. This procedure is used when a part of the esophagus has become so narrow that it becomes difficult, painful or even impossible to swallow. This is generally an uncomplicated form of treatment. When this is not successful, chest surgery may be required. This is a much more extensive form of treatment with a longer recovery time. CAUSES  Some of the more common causes of blockage or strictures of the esophagus are:  Narrowing from longstanding inflammation (soreness and redness) of the lower esophagus. This comes from the constant exposure of the lower esophagus to the acid which bubbles up from the stomach. Over time this causes scarring and narrowing of the lower esophagus.  Hiatal hernia in which a small part of the stomach bulges (herniates) up through the diaphragm. This can cause a gradual narrowing of the end of the esophagus.  Schatzki's Ring is a narrow ring of benign (non-cancerous) fibrous tissue which constricts the lower esophagus. The reason for this is not known.  Scleroderma is a connective tissue disorder that affects the esophagus and makes swallowing difficult.  Achalasia is an absence of nerves to the lower esophagus and to the esophageal sphincter. This is the circular muscle between the stomach and esophagus that relaxes to allow food into the stomach. After swallowing, it contracts to keep food in the stomach. This absence of nerves may be congenital (present since birth). This can cause irregular spasms of the lower esophageal muscle. This spasm does not open up to allow food and fluid through. The result is a persistent blockage  with subsequent slow trickling of the esophageal contents into the stomach.  Strictures may develop from swallowing materials which damage the esophagus. Some examples are strong acids or alkalis such as lye.  Growths such as benign (non-cancerous) and malignant (cancerous) tumors can block the esophagus.  Heredity (present since birth) causes. DIAGNOSIS  Your caregiver often suspects this problem by taking a medical history. They will also do a physical exam. They can then prove their suspicions using X-rays and endoscopy. Endoscopy is an exam in which a tube like a small flexible telescope is used to look at your esophagus.  TREATMENT There are different stretching (dilating) techniques which can be used. Simple bougie dilatation may be done in the office. This usually takes only a couple minutes. A numbing (anesthetic) spray of the throat is used. Endoscopy, when done, is done in an endoscopy suite, under mild sedation. When fluoroscopy is used, the procedure is performed in X-ray. Other techniques require a  little longer time. Recovery is usually quick. There is no waiting time to begin eating and drinking to test success of the treatment. Following are some of the methods used. Narrowing of the esophagus is treated by making it bigger. Commonly this is a mechanical problem which can be treated with stretching. This can be done in different ways. Your caregiver will discuss these with you. Some of the means used are:  A series of graduated (increasing thickness) flexible dilators can be used. These are weighted tubes passed through the esophagus into the stomach. The tubes used become progressively larger until the desired stretched size is reached. Graduated dilators are a simple and quick way of opening the esophagus. No visualization is required.  Another method is the use of endoscopy to place a flexible wire across the stricture. The endoscope is removed and the wire left in place. A dilator  with a hole through it from end to end is guided down the esophagus and across the stricture. One or more of these dilators are passed over the wire. At the end of the exam, the wire is removed. This type of treatment may be performed in the X-ray department under fluoroscopy. An advantage of this procedure is the examiner is visualizing the end opening in the esophagus.  Stretching of the esophagus may be done using balloons. Deflated balloons are placed through the endoscope and across the stricture. This type of balloon dilatation is often done at the time of endoscopy or fluoroscopy. Flexible endoscopy allows the examiner to directly view the stricture. A balloon is inserted in the deflated form into the area of narrowing. It is then inflated with air to a certain pressure that is pre-set for a given circumference. When inflated, it becomes sausage shaped, stretched, and makes the stricture larger.  Achalasia requires a longer larger balloon-type dilator. This is frequently done under X-ray control. In this situation, the spastic muscle fibers in the lower esophagus are stretched. All of the above procedures make the passage of food and water into the stomach easier. They also make it easier for stomach contents to reflux back into the esophagus. Special medications may be used following the procedure to help prevent further stricturing. Proton-pump inhibitor medications are good at decreasing the amount of acid in the stomach juice. When stomach juice refluxes into the esophagus, the juice is no longer as acidic and is less likely to burn or scar the esophagus. RISKS AND COMPLICATIONS Esophageal dilatation is usually performed effectively and without problems. Some complications that can occur are:  A small amount of bleeding almost always happens where the stretching takes place. If this is too excessive it may require more aggressive treatment.  An uncommon complication is perforation (making a  hole) of the esophagus. The esophagus is thin. It is easy to make a hole in it. If this happens, an operation may be necessary to repair this.  A small, undetected perforation could lead to an infection in the chest. This can be very serious. HOME CARE INSTRUCTIONS   If you received sedation for your procedure, do not drive, make important decisions, or perform any activities requiring your full coordination. Do not drink alcohol, take sedatives, or use any mind altering chemicals unless instructed by your caregiver.  You may use throat lozenges or warm salt water gargles if you have throat discomfort  You can begin eating and drinking normally on return home unless instructed otherwise. Do not purposely try to force large chunks of food  down to test the benefits of your procedure.  Mild discomfort can be eased with sips of ice water.  Medications for discomfort may or may not be needed. SEEK IMMEDIATE MEDICAL CARE IF:   You begin vomiting up blood.  You develop black tarry stools  You develop chills or an unexplained temperature of over 101 F (38.3 C)  You develop chest or abdominal pain.  You develop shortness of breath or feel lightheaded or faint.  Your swallowing is becoming more painful, difficult, or you are unable to swallow. MAKE SURE YOU:   Understand these instructions.  Will watch your condition.  Will get help right away if you are not doing well or get worse. Document Released: 12/08/2005 Document Revised: 01/09/2012 Document Reviewed: 01/25/2006 [facility] Patient Information 2014 [employer]. Esophagogastroduodenoscopy Esophagogastroduodenoscopy (EGD) is a procedure to examine the lining of the esophagus, stomach, and first part of the small intestine (duodenum). A long, flexible, lighted tube with a camera attached (endoscope) is inserted down the throat to view these organs. This procedure is done to detect problems or abnormalities, such as inflammation,  bleeding, ulcers, or growths, in order to treat them. The procedure lasts about 5 20 minutes. It is usually an outpatient procedure, but it may need to be performed in emergency cases in the hospital. LET YOUR CAREGIVER KNOW ABOUT:   Allergies to food or medicine.  All medicines you are taking, including vitamins, herbs, eyedrops, and over-the-counter medicines and creams.  Use of steroids (by mouth or creams).  Previous problems you or members of your family have had with the use of anesthetics.  Any blood disorders you have.  Previous surgeries you have had.  Other health problems you have.  Possibility of pregnancy, if this applies. RISKS AND COMPLICATIONS  Generally, EGD is a safe procedure. However, as with any procedure, complications can occur. Possible complications include:  Infection.  Bleeding.  Tearing (perforation) of the esophagus, stomach, or duodenum.  Difficulty breathing or not being able to breath.  Excessive sweating.  Spasms of the larynx.  Slowed heartbeat.  Low blood pressure. BEFORE THE PROCEDURE  Do not eat or drink anything for 6 8 hours before the procedure or as directed by your caregiver.  Ask your caregiver about changing or stopping your regular medicines.  If you wear dentures, be prepared to remove them before the procedure.  Arrange for someone to drive you home after the procedure. PROCEDURE   A vein will be accessed to give medicines and fluids. A medicine to relax you (sedative) and a pain reliever will be given through that access into the vein.  A numbing medicine (local anesthetic) may be sprayed on your throat for comfort and to stop you from gagging or coughing.  A mouth guard may be placed in your mouth to protect your teeth and to keep you from biting on the endoscope.  You will be asked to lie on your left side.  The endoscope is inserted down your throat and into the esophagus, stomach, and duodenum.  Air is put  through the endoscope to allow your caregiver to view the lining of your esophagus clearly.  The esophagus, stomach, and duodenum is then examined. During the exam, your caregiver may:  Remove tissue to be examined under a microscope (biopsy) for inflammation, infection, or other medical problems.  Remove growths.  Remove objects (foreign bodies) that are stuck.  Treat any bleeding with medicines or other devices that stop tissues from bleeding (hot cauters, clipping  devices).  Widen (dilate) or stretch narrowed areas of the esophagus and stomach.  The endoscope will then be withdrawn. AFTER THE PROCEDURE  You will be taken to a recovery area to be monitored. You will be able to go home once you are stable and alert.  Do not eat or drink anything until the local anesthetic and numbing medicines have worn off. You may choke.  It is normal to feel bloated, have pain with swallowing, or have a sore throat for a short time. This will wear off.  Your caregiver should be able to discuss his or her findings with you. It will take longer to discuss the test results if any biopsies were taken. Document Released: 02/17/2005 Document Revised: 10/03/2012 Document Reviewed: 09/19/2012 [facility] Patient Information 2014 [place], [place]. PATIENT INSTRUCTIONS POST-ANESTHESIA  IMMEDIATELY FOLLOWING SURGERY:  Do not drive or operate machinery for the first twenty four hours after surgery.  Do not make any important decisions for twenty four hours after surgery or while taking narcotic pain medications or sedatives.  If you develop intractable nausea and vomiting or a severe headache please notify your doctor immediately.  FOLLOW-UP:  Please make an appointment with your surgeon as instructed. You do not need to follow up with anesthesia unless specifically instructed to do so.  WOUND CARE INSTRUCTIONS (if applicable):  Keep a dry clean dressing on the anesthesia/puncture wound site if there is  drainage.  Once the wound has quit draining you may leave it open to air.  Generally you should leave the bandage intact for twenty four hours unless there is drainage.  If the epidural site drains for more than 36-48 hours please call the anesthesia department.  QUESTIONS?:  Please feel free to call your physician or the hospital operator if you have any questions, and they will be happy to assist you.

## 2014-03-06 ENCOUNTER — Encounter (HOSPITAL_COMMUNITY): Payer: Medicare Other | Admitting: Anesthesiology

## 2014-03-06 ENCOUNTER — Encounter (HOSPITAL_COMMUNITY): Payer: Self-pay

## 2014-03-06 ENCOUNTER — Ambulatory Visit (HOSPITAL_COMMUNITY)
Admission: RE | Admit: 2014-03-06 | Discharge: 2014-03-06 | Disposition: A | Discharge status: Home / Self Care | Payer: Medicare Other | Source: Ambulatory Visit | Attending: Internal Medicine | Admitting: Internal Medicine

## 2014-03-06 ENCOUNTER — Encounter (HOSPITAL_COMMUNITY)
Admission: RE | Disposition: A | Discharge status: Home / Self Care | Payer: Self-pay | Source: Ambulatory Visit | Attending: Internal Medicine

## 2014-03-06 ENCOUNTER — Ambulatory Visit (HOSPITAL_COMMUNITY): Payer: Medicare Other | Admitting: Anesthesiology

## 2014-03-06 DIAGNOSIS — Z79899 Other long term (current) drug therapy: Secondary | ICD-10-CM | POA: Insufficient documentation

## 2014-03-06 DIAGNOSIS — K208 Other esophagitis without bleeding: Secondary | ICD-10-CM | POA: Insufficient documentation

## 2014-03-06 DIAGNOSIS — G709 Myoneural disorder, unspecified: Secondary | ICD-10-CM | POA: Insufficient documentation

## 2014-03-06 DIAGNOSIS — K219 Gastro-esophageal reflux disease without esophagitis: Secondary | ICD-10-CM | POA: Insufficient documentation

## 2014-03-06 DIAGNOSIS — K6389 Other specified diseases of intestine: Secondary | ICD-10-CM | POA: Insufficient documentation

## 2014-03-06 DIAGNOSIS — Z7982 Long term (current) use of aspirin: Secondary | ICD-10-CM | POA: Insufficient documentation

## 2014-03-06 DIAGNOSIS — Z87891 Personal history of nicotine dependence: Secondary | ICD-10-CM | POA: Insufficient documentation

## 2014-03-06 DIAGNOSIS — Z888 Allergy status to other drugs, medicaments and biological substances status: Secondary | ICD-10-CM | POA: Insufficient documentation

## 2014-03-06 DIAGNOSIS — M549 Dorsalgia, unspecified: Secondary | ICD-10-CM | POA: Insufficient documentation

## 2014-03-06 DIAGNOSIS — R1310 Dysphagia, unspecified: Secondary | ICD-10-CM

## 2014-03-06 DIAGNOSIS — E039 Hypothyroidism, unspecified: Secondary | ICD-10-CM | POA: Insufficient documentation

## 2014-03-06 DIAGNOSIS — Z882 Allergy status to sulfonamides status: Secondary | ICD-10-CM | POA: Insufficient documentation

## 2014-03-06 DIAGNOSIS — G8929 Other chronic pain: Secondary | ICD-10-CM | POA: Insufficient documentation

## 2014-03-06 DIAGNOSIS — K2970 Gastritis, unspecified, without bleeding: Secondary | ICD-10-CM

## 2014-03-06 DIAGNOSIS — K222 Esophageal obstruction: Secondary | ICD-10-CM | POA: Insufficient documentation

## 2014-03-06 DIAGNOSIS — Z800 Family history of malignant neoplasm of digestive organs: Secondary | ICD-10-CM | POA: Insufficient documentation

## 2014-03-06 DIAGNOSIS — K5730 Diverticulosis of large intestine without perforation or abscess without bleeding: Secondary | ICD-10-CM

## 2014-03-06 DIAGNOSIS — K5732 Diverticulitis of large intestine without perforation or abscess without bleeding: Secondary | ICD-10-CM

## 2014-03-06 DIAGNOSIS — K5792 Diverticulitis of intestine, part unspecified, without perforation or abscess without bleeding: Secondary | ICD-10-CM

## 2014-03-06 DIAGNOSIS — I252 Old myocardial infarction: Secondary | ICD-10-CM | POA: Insufficient documentation

## 2014-03-06 DIAGNOSIS — I2510 Atherosclerotic heart disease of native coronary artery without angina pectoris: Secondary | ICD-10-CM | POA: Insufficient documentation

## 2014-03-06 DIAGNOSIS — K5900 Constipation, unspecified: Secondary | ICD-10-CM | POA: Insufficient documentation

## 2014-03-06 DIAGNOSIS — E7800 Pure hypercholesterolemia, unspecified: Secondary | ICD-10-CM | POA: Insufficient documentation

## 2014-03-06 DIAGNOSIS — I10 Essential (primary) hypertension: Secondary | ICD-10-CM | POA: Insufficient documentation

## 2014-03-06 DIAGNOSIS — Z880 Allergy status to penicillin: Secondary | ICD-10-CM | POA: Insufficient documentation

## 2014-03-06 HISTORY — PX: ESOPHAGOGASTRODUODENOSCOPY (EGD) WITH PROPOFOL: SHX5813

## 2014-03-06 HISTORY — PX: COLONOSCOPY WITH PROPOFOL: SHX5780

## 2014-03-06 HISTORY — PX: BIOPSY: SHX5522

## 2014-03-06 HISTORY — PX: MALONEY DILATION: SHX5535

## 2014-03-06 SURGERY — ESOPHAGOGASTRODUODENOSCOPY (EGD) WITH PROPOFOL
Anesthesia: Monitor Anesthesia Care

## 2014-03-06 MED ORDER — GLYCOPYRROLATE 0.2 MG/ML IJ SOLN
0.2000 mg | Freq: Once | INTRAMUSCULAR | Status: AC
  Administered 2014-03-06: 0.2 mg via INTRAVENOUS

## 2014-03-06 MED ORDER — MIDAZOLAM HCL 2 MG/2ML IJ SOLN
INTRAMUSCULAR | Status: AC
  Filled 2014-03-06: qty 2

## 2014-03-06 MED ORDER — FENTANYL CITRATE 0.05 MG/ML IJ SOLN
INTRAMUSCULAR | Status: AC
  Filled 2014-03-06: qty 2

## 2014-03-06 MED ORDER — FENTANYL CITRATE 0.05 MG/ML IJ SOLN: 25.0000 ug | INTRAMUSCULAR | Status: DC | PRN

## 2014-03-06 MED ORDER — PROPOFOL 10 MG/ML IV BOLUS
INTRAVENOUS | Status: DC | PRN
  Administered 2014-03-06: 11.8 mg via INTRAVENOUS

## 2014-03-06 MED ORDER — LIDOCAINE HCL (PF) 1 % IJ SOLN
INTRAMUSCULAR | Status: AC
  Filled 2014-03-06: qty 5

## 2014-03-06 MED ORDER — PROPOFOL 10 MG/ML IV BOLUS
INTRAVENOUS | Status: AC
  Filled 2014-03-06: qty 20

## 2014-03-06 MED ORDER — ONDANSETRON HCL 4 MG/2ML IJ SOLN
4.0000 mg | Freq: Once | INTRAMUSCULAR | Status: AC
  Administered 2014-03-06: 4 mg via INTRAVENOUS

## 2014-03-06 MED ORDER — LACTATED RINGERS IV SOLN
INTRAVENOUS | Status: DC
  Administered 2014-03-06: 07:00:00 via INTRAVENOUS

## 2014-03-06 MED ORDER — PROPOFOL INFUSION 10 MG/ML OPTIME
INTRAVENOUS | Status: DC | PRN
  Administered 2014-03-06: 70 ug/kg/min via INTRAVENOUS
  Administered 2014-03-06: 200 ug/kg/min via INTRAVENOUS

## 2014-03-06 MED ORDER — GLYCOPYRROLATE 0.2 MG/ML IJ SOLN
INTRAMUSCULAR | Status: AC
Start: 2014-03-06 — End: 2014-03-06
  Filled 2014-03-06: qty 1

## 2014-03-06 MED ORDER — FENTANYL CITRATE 0.05 MG/ML IJ SOLN
INTRAMUSCULAR | Status: DC | PRN
  Administered 2014-03-06: 25 ug via INTRAVENOUS

## 2014-03-06 MED ORDER — FENTANYL CITRATE 0.05 MG/ML IJ SOLN
25.0000 ug | INTRAMUSCULAR | Status: AC
  Administered 2014-03-06 (×2): 25 ug via INTRAVENOUS

## 2014-03-06 MED ORDER — MIDAZOLAM HCL 2 MG/2ML IJ SOLN
1.0000 mg | INTRAMUSCULAR | Status: DC | PRN
  Administered 2014-03-06 (×2): 2 mg via INTRAVENOUS

## 2014-03-06 MED ORDER — ONDANSETRON HCL 4 MG/2ML IJ SOLN
INTRAMUSCULAR | Status: AC
Start: 2014-03-06 — End: 2014-03-06
  Filled 2014-03-06: qty 2

## 2014-03-06 MED ORDER — STERILE WATER FOR IRRIGATION IR SOLN
Status: DC | PRN
  Administered 2014-03-06: 07:00:00

## 2014-03-06 MED ORDER — LIDOCAINE HCL (CARDIAC) 10 MG/ML IV SOLN
INTRAVENOUS | Status: DC | PRN
  Administered 2014-03-06: 10 mg via INTRAVENOUS

## 2014-03-06 MED ORDER — RABEPRAZOLE SODIUM 20 MG PO TBEC: 20.0000 mg | DELAYED_RELEASE_TABLET | Freq: Every day | ORAL | Status: DC

## 2014-03-06 MED ORDER — SUCRALFATE 1 GM/10ML PO SUSP: 1.0000 g | Freq: Three times a day (TID) | ORAL | Status: DC

## 2014-03-06 MED ORDER — ONDANSETRON HCL 4 MG/2ML IJ SOLN
4.0000 mg | Freq: Once | INTRAMUSCULAR | Status: DC | PRN
Start: 2014-03-06 — End: 2014-03-06

## 2014-03-06 MED ORDER — BUTAMBEN-TETRACAINE-BENZOCAINE 2-2-14 % EX AERO
1.0000 | INHALATION_SPRAY | Freq: Once | CUTANEOUS | Status: AC
  Administered 2014-03-06: 1 via TOPICAL
  Filled 2014-03-06: qty 56

## 2014-03-06 SURGICAL SUPPLY — 40 items
BALLN CRE LF 10-12 240X5.5 (BALLOONS)
BALLN CRE LF 10-12MM 240X5.5 (BALLOONS)
BALLN DILATOR CRE 12-15 240 (BALLOONS)
BALLN DILATOR CRE 15-18 240 (BALLOONS) IMPLANT
BALLN DILATOR CRE 18-20 240 (BALLOONS) IMPLANT
BALLN DILATOR CRE WIREGUIDE (BALLOONS)
BALLOON CRE LF 10-12 240X5.5 (BALLOONS) IMPLANT
BALLOON DILATOR CRE 12-15 240 (BALLOONS) IMPLANT
BALLOON DILATOR CRE WIREGUIDE (BALLOONS) IMPLANT
BLOCK BITE 60FR ADLT L/F BLUE (MISCELLANEOUS) ×3 IMPLANT
ELECT REM PT RETURN 9FT ADLT (ELECTROSURGICAL)
ELECTRODE REM PT RTRN 9FT ADLT (ELECTROSURGICAL) IMPLANT
FCP BXJMBJMB 240X2.8X (CUTTING FORCEPS)
FLOOR PAD 36X40 (MISCELLANEOUS) ×3
FORCEP COLD BIOPSY (CUTTING FORCEPS) IMPLANT
FORCEP RJ3 GP 1.8X160 W-NEEDLE (CUTTING FORCEPS) IMPLANT
FORCEPS BIOP RAD 4 LRG CAP 4 (CUTTING FORCEPS) ×2 IMPLANT
FORCEPS BIOP RJ4 240 W/NDL (CUTTING FORCEPS)
FORCEPS BXJMBJMB 240X2.8X (CUTTING FORCEPS) IMPLANT
FORMALIN 10 PREFIL 20ML (MISCELLANEOUS) ×2 IMPLANT
INJECTOR/SNARE I SNARE (MISCELLANEOUS) IMPLANT
KIT CLEAN ENDO COMPLIANCE (KITS) ×3 IMPLANT
LUBRICANT JELLY 4.5OZ STERILE (MISCELLANEOUS) ×2 IMPLANT
MANIFOLD NEPTUNE II (INSTRUMENTS) ×3 IMPLANT
MANIFOLD NEPTUNE WASTE (CANNULA) ×2 IMPLANT
NDL SCLEROTHERAPY 25GX240 (NEEDLE) IMPLANT
NEEDLE SCLEROTHERAPY 25GX240 (NEEDLE) IMPLANT
PAD FLOOR 36X40 (MISCELLANEOUS) ×1 IMPLANT
PROBE APC STR FIRE (PROBE) IMPLANT
PROBE INJECTION GOLD (MISCELLANEOUS)
PROBE INJECTION GOLD 7FR (MISCELLANEOUS) IMPLANT
SNARE ROTATE MED OVAL 20MM (MISCELLANEOUS) IMPLANT
SNARE SHORT THROW 13M SML OVAL (MISCELLANEOUS) ×3 IMPLANT
SYR 50ML LL SCALE MARK (SYRINGE) ×2 IMPLANT
SYR INFLATE BILIARY GAUGE (MISCELLANEOUS) IMPLANT
SYR INFLATION 60ML (SYRINGE) IMPLANT
TRAP SPECIMEN MUCOUS 40CC (MISCELLANEOUS) IMPLANT
TUBING ENDO SMARTCAP PENTAX (MISCELLANEOUS) ×3 IMPLANT
TUBING IRRIGATION ENDOGATOR (MISCELLANEOUS) ×1 IMPLANT
WATER STERILE IRR 1000ML POUR (IV SOLUTION) ×2 IMPLANT

## 2014-03-06 NOTE — H&P (Signed)
[name] is an 63 y.o. male.   Chief Complaint: Patient is here for EGD, EGD and colonoscopy. HPI: Patient is 56-year-old Caucasian male who presents with 5-6 year history of dysphagia primarily to solids. He states he had his esophagus stretched more than 15 years ago but it didn't help a great deal. Lately he has had multiple episodes of food impaction relieved with self-induced vomiting. He states omeprazole is not controlling heartburn. He has tried his wife's  rebaprazole which works much better. He denies anorexia or weight loss. He has history of diverticulitis for which was treated in generally 20/15. He has chronic constipation due to pain medication which she takes for chronic back pain. He denies rectal bleeding or melena. Family history significant for colon carcinoma and in his first cousin who died at 40. She was diagnosed around 39 or 62. Maternal grandmother also had CRC at 72.  Past Medical History  Diagnosis Date  . Hypertension   . Coronary artery disease   . Chronic back pain   . Hypercholesteremia   . GERD (gastroesophageal reflux disease)   . Allergy   . Lumbar pain   . Hypothyroid   . Carpal tunnel syndrome   . Myocardial infarction 2009  . History of thrombocytopenia   . Degenerative disc disease     Past Surgical History  Procedure Laterality Date  . Neck surgery    . Lumbar fusion  2009  . Colonoscopy    . Coronary angioplasty with stent placement      2000, and 2004 has 3 stents  . Back surgery      5 lumbas disc with cervical and lumbar fusions    Family History  Problem Relation Age of Onset  . Heart attack Father    Social History:  reports that he quit smoking about 22 years ago. His smoking use included Cigarettes. He has a 40 pack-year smoking history. He has never used smokeless tobacco. He reports that he does not drink alcohol or use illicit drugs.  Allergies:  Allergies  Allergen Reactions  . Penicillins Other (See Comments)    "I  passed out last time I had it and they told me not to take it again."  . Sulfa Antibiotics Swelling  . Zoloft [Sertraline Hcl] Other (See Comments)    Confusion     Medications Prior to Admission  Medication Sig Dispense Refill  . albuterol (PROVENTIL HFA;VENTOLIN HFA) 108 (90 BASE) MCG/ACT inhaler Inhale into the lungs every 6 (six) hours as needed for wheezing or shortness of breath.      Marland Kitchen aspirin EC 81 MG tablet Take 81 mg by mouth daily.      Marland Kitchen atorvastatin (LIPITOR) 80 MG tablet Take 1 tablet (80 mg total) by mouth daily.  30 tablet  5  . cetirizine (ZYRTEC) 10 MG tablet Take 10 mg by mouth at bedtime.        . citalopram (CELEXA) 20 MG tablet Take 1 tablet (20 mg total) by mouth every morning.  90 tablet  1  . doxazosin (CARDURA) 4 MG tablet Take 1 tablet (4 mg total) by mouth every morning.  90 tablet  1  . hydrochlorothiazide (HYDRODIURIL) 25 MG tablet Take 1 tablet (25 mg total) by mouth daily.  90 tablet  4  . levothyroxine (SYNTHROID, LEVOTHROID) 100 MCG tablet Take 1 tablet (100 mcg total) by mouth daily before breakfast.  90 tablet  1  . metroNIDAZOLE (FLAGYL) 500 MG tablet Take 1 tablet (  500 mg total) by mouth 3 (three) times daily.  30 tablet  0  . morphine (MS CONTIN) 60 MG 12 hr tablet Take 60 mg by mouth every 8 (eight) hours. Scheduled doses for pain      . morphine (MSIR) 15 MG tablet Take 15 mg by mouth every 6 (six) hours as needed for severe pain.      . Multiple Vitamin (MULTIVITAMIN) capsule Take 1 capsule by mouth daily.        . nadolol (CORGARD) 40 MG tablet Take 1 tablet (40 mg total) by mouth at bedtime.  90 tablet  1  . nitroGLYCERIN (NITROSTAT) 0.4 MG SL tablet Place 1 tablet (0.4 mg total) under the tongue every 5 (five) minutes x 3 doses as needed for chest pain (up to 3 doses).  25 tablet  3  . omeprazole (PRILOSEC) 20 MG capsule Take 20 mg by mouth 2 (two) times daily.        . peg 3350 powder (MOVIPREP) 100 G SOLR Take 1 kit (200 g total) by mouth once.   1 kit  0  . potassium chloride (K-DUR) 10 MEQ tablet Take 1 tablet (10 mEq total) by mouth daily.  90 tablet  1  . ibuprofen (ADVIL,MOTRIN) 200 MG tablet Take 600 mg by mouth daily as needed for moderate pain.        No results found for this or any previous visit (from the past 48 hour(s)). No results found.  ROS  Blood pressure 169/88, pulse 81, temperature 97.7 F (36.5 C), temperature source Oral, resp. rate 15, SpO2 94.00%. Physical Exam  Constitutional: He appears well-developed and well-nourished.  HENT:  Mouth/Throat: Oropharynx is clear and moist.  Eyes: Conjunctivae are normal. No scleral icterus.  Neck: No thyromegaly present.  Cardiovascular: Normal rate, regular rhythm and normal heart sounds.   No murmur heard. Respiratory: Effort normal and breath sounds normal.  GI: Soft. He exhibits no distension and no mass. There is no tenderness.  Musculoskeletal: He exhibits no edema.  Lymphadenopathy:    He has no cervical adenopathy.  Neurological: He is alert.  Skin: Skin is warm and dry.     Assessment/Plan Solid food dysphagia. Chronic GERD. History of diverticulitis and family history of colon carcinoma in 2 second-degree relatives.  EGD with ED and diagnostic colonoscopy.   [name] 03/06/2014, 7:18 AM

## 2014-03-06 NOTE — Anesthesia Postprocedure Evaluation (Signed)
  Anesthesia Post-op Note  Patient: [name]  Procedure(s) Performed: Procedure(s) with comments: ESOPHAGOGASTRODUODENOSCOPY (EGD) WITH PROPOFOL (N/A) MALONEY DILATION 54 french (N/A) ESOPHAGEAL BIOPSIES (N/A) COLONOSCOPY WITH PROPOFOL (N/A) - in cecum at 0807; total withdrawal time 9 minutes  Patient Location: PACU  Anesthesia Type:MAC  Level of Consciousness: awake, alert  and patient cooperative  Airway and Oxygen Therapy: Patient Spontanous Breathing  Post-op Pain: none  Post-op Assessment: Post-op Vital signs reviewed, Patient's Cardiovascular Status Stable and Respiratory Function Stable  Post-op Vital Signs: Reviewed and stable  Last Vitals:  Filed Vitals:   03/06/14 0825  BP: 137/81  Pulse: 85  Temp: 36.4 C  Resp: 22    Complications: No apparent anesthesia complications

## 2014-03-06 NOTE — Anesthesia Procedure Notes (Signed)
Procedure Name: MAC Date/Time: 03/06/2014 7:29 AM Performed by: Vista Deck Pre-anesthesia Checklist: Patient identified, Emergency Drugs available, Suction available, Timeout performed and Patient being monitored Patient Re-evaluated:Patient Re-evaluated prior to inductionOxygen Delivery Method: Non-rebreather mask

## 2014-03-06 NOTE — Anesthesia Preprocedure Evaluation (Addendum)
Anesthesia Evaluation  Patient identified by MRN, date of birth, ID band Patient awake    Reviewed: Allergy & Precautions, H&P , NPO status , Patient's Chart, lab work & pertinent test results, reviewed documented beta blocker date and time   Airway Mallampati: II TM Distance: >3 FB Neck ROM: Full    Dental  (+) Teeth Intact, Dental Advisory Given   Pulmonary shortness of breath and with exertion, former smoker,  breath sounds clear to auscultation        Cardiovascular hypertension, Pt. on home beta blockers and Pt. on medications + CAD, + Past MI and + Cardiac Stents Rhythm:Regular Rate:Normal     Neuro/Psych  Neuromuscular disease    GI/Hepatic GERD-  Medicated and Controlled,  Endo/Other  Hypothyroidism   Renal/GU      Musculoskeletal   Abdominal   Peds  Hematology   Anesthesia Other Findings   Reproductive/Obstetrics                         Anesthesia Physical Anesthesia Plan  ASA: III  Anesthesia Plan: MAC   Post-op Pain Management:    Induction: Intravenous  Airway Management Planned: Simple Face Mask  Additional Equipment:   Intra-op Plan:   Post-operative Plan:   Informed Consent: I have reviewed the patients History and Physical, chart, labs and discussed the procedure including the risks, benefits and alternatives for the proposed anesthesia with the patient or authorized representative who has indicated his/her understanding and acceptance.     Plan Discussed with:   Anesthesia Plan Comments:         Anesthesia Quick Evaluation

## 2014-03-06 NOTE — Transfer of Care (Signed)
Immediate Anesthesia Transfer of Care Note  Patient: [name]  Procedure(s) Performed: Procedure(s) with comments: ESOPHAGOGASTRODUODENOSCOPY (EGD) WITH PROPOFOL (N/A) MALONEY DILATION 54 french (N/A) ESOPHAGEAL BIOPSIES (N/A) COLONOSCOPY WITH PROPOFOL (N/A) - in cecum at 0807; total withdrawal time 9 minutes  Patient Location: PACU  Anesthesia Type:MAC  Level of Consciousness: awake and patient cooperative  Airway & Oxygen Therapy: Patient Spontanous Breathing  Post-op Assessment: Report given to PACU RN, Post -op Vital signs reviewed and stable and Patient moving all extremities  Post vital signs: Reviewed and stable  Complications: No apparent anesthesia complications

## 2014-03-06 NOTE — Discharge Instructions (Addendum)
Resume usual medications but discontinue omeprazole and no aspirin for 5 days. Sucralfate 1 g by mouth 30-60 minutes before each meal and at bedtime for 2 weeks. Pantaprazole (Protonix) 20 mg by mouth 30 minutes before breakfast daily. Soft foods for 24 hours and then usual diet. No driving for 24 hours. Physician will call with the results of blood tests and biopsy.   Esophagogastroduodenoscopy Esophagogastroduodenoscopy (EGD) is a procedure to examine the lining of the esophagus, stomach, and first part of the small intestine (duodenum). A long, flexible, lighted tube with a camera attached (endoscope) is inserted down the throat to view these organs. This procedure is done to detect problems or abnormalities, such as inflammation, bleeding, ulcers, or growths, in order to treat them. The procedure lasts about 5 20 minutes. It is usually an outpatient procedure, but it may need to be performed in emergency cases in the hospital. LET YOUR CAREGIVER KNOW ABOUT:   Allergies to food or medicine.  All medicines you are taking, including vitamins, herbs, eyedrops, and over-the-counter medicines and creams.  Use of steroids (by mouth or creams).  Previous problems you or members of your family have had with the use of anesthetics.  Any blood disorders you have.  Previous surgeries you have had.  Other health problems you have.  Possibility of pregnancy, if this applies. RISKS AND COMPLICATIONS  Generally, EGD is a safe procedure. However, as with any procedure, complications can occur. Possible complications include:  Infection.  Bleeding.  Tearing (perforation) of the esophagus, stomach, or duodenum.  Difficulty breathing or not being able to breath.  Excessive sweating.  Spasms of the larynx.  Slowed heartbeat.  Low blood pressure. BEFORE THE PROCEDURE  Do not eat or drink anything for 6 8 hours before the procedure or as directed by your caregiver.  Ask your caregiver  about changing or stopping your regular medicines.  If you wear dentures, be prepared to remove them before the procedure.  Arrange for someone to drive you home after the procedure. PROCEDURE   A vein will be accessed to give medicines and fluids. A medicine to relax you (sedative) and a pain reliever will be given through that access into the vein.  A numbing medicine (local anesthetic) may be sprayed on your throat for comfort and to stop you from gagging or coughing.  A mouth guard may be placed in your mouth to protect your teeth and to keep you from biting on the endoscope.  You will be asked to lie on your left side.  The endoscope is inserted down your throat and into the esophagus, stomach, and duodenum.  Air is put through the endoscope to allow your caregiver to view the lining of your esophagus clearly.  The esophagus, stomach, and duodenum is then examined. During the exam, your caregiver may:  Remove tissue to be examined under a microscope (biopsy) for inflammation, infection, or other medical problems.  Remove growths.  Remove objects (foreign bodies) that are stuck.  Treat any bleeding with medicines or other devices that stop tissues from bleeding (hot cauters, clipping devices).  Widen (dilate) or stretch narrowed areas of the esophagus and stomach.  The endoscope will then be withdrawn. AFTER THE PROCEDURE  You will be taken to a recovery area to be monitored. You will be able to go home once you are stable and alert.  Do not eat or drink anything until the local anesthetic and numbing medicines have worn off. You may choke.  It is  normal to feel bloated, have pain with swallowing, or have a sore throat for a short time. This will wear off.  Your caregiver should be able to discuss his or her findings with you. It will take longer to discuss the test results if any biopsies were taken. Document Released: 02/17/2005 Document Revised: 10/03/2012 Document  Reviewed: 09/19/2012 [facility] Patient Information 2014 [place], [place]. Colonoscopy Care After These instructions give you information on caring for yourself after your procedure. Your doctor may also give you more specific instructions. Call your doctor if you have any problems or questions after your procedure. HOME CARE  Take it easy for the next 24 hours.  Rest.  Walk or use warm packs on your belly (abdomen) if you have belly cramping or gas.  Do not drive for 24 hours.  You may shower.  Do not sign important papers or use machinery for 24 hours.  Drink enough fluids to keep your pee (urine) clear or pale yellow.  Resume your normal diet. Avoid heavy or fried foods.  Avoid alcohol.  Continue taking your normal medicines.  Only take medicine as told by your doctor. Do not take aspirin. If you had growths (polyps) removed:  Do not take aspirin.  Do not drink alcohol for 7 days or as told by your doctor.  Eat a soft diet for 24 hours. GET HELP RIGHT AWAY IF:  You have a fever.  You pass clumps of tissue (blood clots) or fill the toilet with blood.  You have belly pain that gets worse and medicine does not help.  Your belly is puffy (swollen).  You feel sick to your stomach (nauseous) or throw up (vomit). MAKE SURE YOU:  Understand these instructions.  Will watch your condition.  Will get help right away if you are not doing well or get worse. Document Released: 11/19/2010 Document Revised: 01/09/2012 Document Reviewed: 06/24/2013 [facility] Patient Information 2014 [place].

## 2014-03-06 NOTE — Addendum Note (Signed)
Addendum created 03/06/14 4827 by Vista Deck, CRNA   Modules edited: Anesthesia Blocks and Procedures, Clinical Notes   Clinical Notes:  File: [id number]

## 2014-03-06 NOTE — OR Nursing (Signed)
Movey prep taken, patient drank all, enema also done this am.  Last BM clear.  Prep made patient feel a bit nauseated.

## 2014-03-06 NOTE — Op Note (Signed)
EGD PROCEDURE REPORT  PATIENT:  [name]  MR#:  [id number] Birthdate:  June 08, 1951, 62 y.o., male Endoscopist:  Dr. [name], MD Referred By:  Dr. Sallee Lange, MD Procedure Date: 03/06/2014  Procedure:   EGD, ED & Colonoscopy   Indications:  Patient is a 63 year old Caucasian male who has chronic GERD and presents with a 5 to six-year history of solid food dysphagia. He has had multiple episodes of food impaction relief with self-induced vomiting. He also has history of diverticulitis. He was treated with antibiotics and generally 2015 and has fully recovered. Family history is positive for colon carcinoma in 2 second degree relatives.            Informed Consent:  The risks, benefits, alternatives & imponderables which include, but are not limited to, bleeding, infection, perforation, drug reaction and potential missed lesion have been reviewed.  The potential for biopsy, lesion removal, esophageal dilation, etc. have also been discussed.  Questions have been answered.  All parties agreeable.  Please see history & physical in medical record for more information.  Medications:  Cetacaine spray topically for oropharyngeal anesthesia Monitored anesthesia care. Please see anesthesia records for details.  EGD  Description of procedure:  The endoscope was introduced through the mouth and advanced to the second portion of the duodenum without difficulty or limitations. The mucosal surfaces were surveyed very carefully during advancement of the scope and upon withdrawal.  Findings:  Esophagus:  Mucosa of proximal middle and distal segment wasnormal with exception of linear erosion distal esophagus extending into GE junction with soft stricture at GE junction. Noncritical luminal narrowing noted to distal 2-3 cm. GEJ:  43 cm Stomach:  Stomach was empty and distended very well with insufflation. Folds in the proximal stomach were normal. Examination of mucosa at gastric body was normal. There  was patchy and linear erythema and edema to antral mucosa but no erosions or ulcers noted. Pyloric channel was patent. Angularis fundus and cardia were unremarkable. Duodenum:  Normal bulbar and post bulbar mucosa.   Therapeutic/Diagnostic Maneuvers Performed:   Esophagus dilated by passing 22 French Maloney dilator to full insertion. Endoscope was passed again and esophageal mucosa reexamined. Linear dissection noted the distal esophagus about 3 cm long and did not extend to GE junction. Mucosal destruction is also noted at GE junction. Esophageal biopsy was taken for routine histology.  COLONOSCOPY Description of procedure:  After a digital rectal exam was performed, that colonoscope was advanced from the anus through the rectum and colon to the area of the cecum, ileocecal valve and appendiceal orifice. The cecum was deeply intubated. These structures were well-seen and photographed for the record. From the level of the cecum and ileocecal valve, the scope was slowly and cautiously withdrawn. The mucosal surfaces were carefully surveyed utilizing scope tip to flexion to facilitate fold flattening as needed. The scope was pulled down into the rectum where a thorough exam including retroflexion was performed.  Findings:   Prep satisfactory. 2 diverticula at cecum. Mucosa of transverse colon and splenic flexure revealed focal erythema and fine nodularity possibly due to lymphoid hyperplasia. Biopsy was taken for routine histology. Scattered diverticula at sigmoid colon. Rectal mucosa and anal rectal junction are unremarkable.  Therapeutic/Diagnostic Maneuvers Performed:  See above  Complications:  None  Cecal Withdrawal Time:  9 minutes  Impression:  EGD findings; Distal esophageal stricture with normal appearing mucosa. Single erosion at GE junction was soft stricture. Nonerosive antral gastritis. Esophagus dilated by passing 54  [place] Maloney dilator resulting in mucosal destruction  or GE junction and linear mucosal disruption at distal esophagus. Esophageal biopsy taken to rule out eosinophilic esophagitis.  Colonoscopy findings; Cecal and sigmoid colon diverticulosis. 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 Biopsy taken. No evidence of colonic polyps.   Recommendations:  Discontinue omeprazole as it is not working. Rebuck result 20 mg by mouth every morning. Sucralfate 1 g by mouth a.c. and each bedtime for 2 weeks. H. Pylori serology. I will be contacting patient with results of biopsy and blood test.   [name]  03/06/2014 8:32 AM  CC: Dr. Sallee Lange, MD & Dr. Rayne Burns ref. provider found

## 2014-03-07 ENCOUNTER — Encounter (HOSPITAL_COMMUNITY): Payer: Self-pay | Admitting: Internal Medicine

## 2014-03-07 LAB — H. PYLORI ANTIBODY, IGG: H Pylori IgG: <0.4 {ISR}

## 2014-08-15 ENCOUNTER — Other Ambulatory Visit: Payer: Self-pay

## 2014-08-29 ENCOUNTER — Ambulatory Visit (INDEPENDENT_AMBULATORY_CARE_PROVIDER_SITE_OTHER): Payer: Medicare Other | Admitting: Family Medicine

## 2014-08-29 ENCOUNTER — Encounter: Payer: Self-pay | Admitting: Family Medicine

## 2014-08-29 VITALS — BP 130/80 | Ht 71.0 in | Wt 250.6 lb

## 2014-08-29 DIAGNOSIS — E785 Hyperlipidemia, unspecified: Secondary | ICD-10-CM

## 2014-08-29 DIAGNOSIS — Z79899 Other long term (current) drug therapy: Secondary | ICD-10-CM

## 2014-08-29 DIAGNOSIS — E038 Other specified hypothyroidism: Secondary | ICD-10-CM

## 2014-08-29 DIAGNOSIS — Z125 Encounter for screening for malignant neoplasm of prostate: Secondary | ICD-10-CM

## 2014-08-29 DIAGNOSIS — E039 Hypothyroidism, unspecified: Secondary | ICD-10-CM | POA: Insufficient documentation

## 2014-08-29 DIAGNOSIS — Z23 Encounter for immunization: Secondary | ICD-10-CM

## 2014-08-29 MED ORDER — PANTOPRAZOLE SODIUM 40 MG PO TBEC: 40.0000 mg | DELAYED_RELEASE_TABLET | Freq: Every day | ORAL | Status: DC

## 2014-08-29 MED ORDER — CITALOPRAM HYDROBROMIDE 20 MG PO TABS: 20.0000 mg | ORAL_TABLET | ORAL | Status: DC

## 2014-08-29 MED ORDER — NADOLOL 40 MG PO TABS: 40.0000 mg | ORAL_TABLET | Freq: Every day | ORAL | Status: DC

## 2014-08-29 MED ORDER — DOXAZOSIN MESYLATE 4 MG PO TABS: 4.0000 mg | ORAL_TABLET | ORAL | Status: DC

## 2014-08-29 MED ORDER — ATORVASTATIN CALCIUM 80 MG PO TABS: 80.0000 mg | ORAL_TABLET | Freq: Every day | ORAL | Status: DC

## 2014-08-29 MED ORDER — LEVOTHYROXINE SODIUM 100 MCG PO TABS: 100.0000 ug | ORAL_TABLET | Freq: Every day | ORAL | Status: DC

## 2014-08-29 NOTE — Progress Notes (Signed)
   Subjective:    Patient ID: [name], male    DOB: 04/08/1951, 63 y.o.   MRN: [id number]  Hypertension This is a chronic problem. The current episode started more than 1 year ago. Pertinent negatives include no chest pain. Risk factors for coronary artery disease include dyslipidemia, male gender and obesity. Treatments tried: hctz, cardura.   He has been unable to do much in way of exercise because of his back he does try to watch how he eats fairly well he denies chest tightness pressure pain shortness of breath. He relates compliance with his cholesterol medicine He states his moods are doing well Celexa helps him Reflux doing well he continues medication  Review of Systems  Constitutional: Negative for activity change, appetite change and fatigue.  HENT: Negative for congestion.   Respiratory: Negative for cough.   Cardiovascular: Negative for chest pain.  Gastrointestinal: Negative for abdominal pain.  Endocrine: Negative for polydipsia and polyphagia.  Neurological: Negative for weakness.  Psychiatric/Behavioral: Negative for confusion.       Objective:   Physical Exam  Vitals reviewed. Constitutional: He appears well-nourished. No distress.  Cardiovascular: Normal rate, regular rhythm and normal heart sounds.   No murmur heard. Pulmonary/Chest: Effort normal and breath sounds normal. No respiratory distress.  Musculoskeletal: He exhibits no edema.  Lymphadenopathy:    He has no cervical adenopathy.  Neurological: He is alert.  Psychiatric: His behavior is normal.          Assessment & Plan:  Hyperlipidemia check lab work await results may need adjustments Hypertension good control continue current measures Underlying coronary artery disease stable no sign of instability continue current measures Chronic pain managed by the pain management specialist Preventative measures discuss lab work ordered. 25 minutes spent with patient.

## 2014-10-20 ENCOUNTER — Other Ambulatory Visit: Payer: Self-pay | Admitting: Family Medicine

## 2015-01-02 ENCOUNTER — Telehealth: Payer: Self-pay | Admitting: Family Medicine

## 2015-01-02 DIAGNOSIS — R2689 Other abnormalities of gait and mobility: Secondary | ICD-10-CM

## 2015-01-02 NOTE — Telephone Encounter (Signed)
Pt is needing a mobility exam for a hover round power chair. Pt  Needs to know if he can get an exam to be qualified for one. If he can Pt will need to call to have form faxed to [place]. Pt states he had a power chair About ten years ago.

## 2015-01-04 NOTE — Telephone Encounter (Signed)
Typically this is handled as a mobility examination from his golf therapy. Please refer the patient to physical therapy for evaluation of mobility/use a powered scooter-wheelchair. If he has forms from this organization he may need to bring them with him when he goes to physical therapy for them to help fill out. If it also requires a face-to-face visit with physician after he has this evaluation with physical therapy he may come in for an office visit bring whatever forms he needs in regards to any letters of support that he might need. This used to be a very easy process but Medicare and insurance companies have made very regimented. That is why an evaluation by physical therapy is necessary in most cases. This gentleman suffers from nerve damage to his legs from severe back troubles. Please assist him thank you

## 2015-01-06 NOTE — Telephone Encounter (Signed)
Notified patient that we referred the patient to physical therapy for evaluation of mobility/use a powered scooter-wheelchair. If he has forms from this organization he may need to bring them with him when he goes to physical therapy for them to help fill out. If it also requires a face-to-face visit with physician after he has this evaluation with physical therapy he may come in for an office visit bring whatever forms he needs in regards to any letters of support that he might need. This used to be a very easy process but Medicare and insurance companies have made very regimented. That is why an evaluation by physical therapy is necessary in most cases. Referral for physical therapy in the system. Patient verbalized understanding.

## 2015-01-30 DIAGNOSIS — M961 Postlaminectomy syndrome, not elsewhere classified: Secondary | ICD-10-CM | POA: Diagnosis not present

## 2015-01-30 DIAGNOSIS — G894 Chronic pain syndrome: Secondary | ICD-10-CM | POA: Diagnosis not present

## 2015-01-30 DIAGNOSIS — M4726 Other spondylosis with radiculopathy, lumbar region: Secondary | ICD-10-CM | POA: Diagnosis not present

## 2015-01-30 DIAGNOSIS — Z79891 Long term (current) use of opiate analgesic: Secondary | ICD-10-CM | POA: Diagnosis not present

## 2015-05-11 DIAGNOSIS — L920 Granuloma annulare: Secondary | ICD-10-CM | POA: Diagnosis not present

## 2015-05-11 DIAGNOSIS — L820 Inflamed seborrheic keratosis: Secondary | ICD-10-CM | POA: Diagnosis not present

## 2015-05-25 ENCOUNTER — Emergency Department (HOSPITAL_COMMUNITY): Payer: Medicare Other

## 2015-05-25 ENCOUNTER — Encounter: Payer: Self-pay | Admitting: Family Medicine

## 2015-05-25 ENCOUNTER — Other Ambulatory Visit: Payer: Self-pay | Admitting: Family Medicine

## 2015-05-25 ENCOUNTER — Inpatient Hospital Stay (HOSPITAL_COMMUNITY)
Admission: EM | Admit: 2015-05-25 | Discharge: 2015-05-27 | DRG: 813 | Disposition: A | Discharge status: Home / Self Care | Payer: Medicare Other | Attending: Internal Medicine | Admitting: Internal Medicine

## 2015-05-25 ENCOUNTER — Ambulatory Visit (INDEPENDENT_AMBULATORY_CARE_PROVIDER_SITE_OTHER): Payer: Medicare Other | Admitting: Family Medicine

## 2015-05-25 ENCOUNTER — Encounter (HOSPITAL_COMMUNITY): Payer: Self-pay | Admitting: Emergency Medicine

## 2015-05-25 VITALS — BP 150/90 | Temp 98.4°F | Ht 71.0 in | Wt 278.0 lb

## 2015-05-25 DIAGNOSIS — I2510 Atherosclerotic heart disease of native coronary artery without angina pectoris: Secondary | ICD-10-CM | POA: Diagnosis not present

## 2015-05-25 DIAGNOSIS — R531 Weakness: Secondary | ICD-10-CM | POA: Diagnosis not present

## 2015-05-25 DIAGNOSIS — Z981 Arthrodesis status: Secondary | ICD-10-CM | POA: Diagnosis not present

## 2015-05-25 DIAGNOSIS — I10 Essential (primary) hypertension: Secondary | ICD-10-CM | POA: Diagnosis present

## 2015-05-25 DIAGNOSIS — D6959 Other secondary thrombocytopenia: Secondary | ICD-10-CM | POA: Diagnosis not present

## 2015-05-25 DIAGNOSIS — D696 Thrombocytopenia, unspecified: Secondary | ICD-10-CM | POA: Diagnosis not present

## 2015-05-25 DIAGNOSIS — Z87891 Personal history of nicotine dependence: Secondary | ICD-10-CM | POA: Diagnosis not present

## 2015-05-25 DIAGNOSIS — D693 Immune thrombocytopenic purpura: Principal | ICD-10-CM | POA: Diagnosis present

## 2015-05-25 DIAGNOSIS — R233 Spontaneous ecchymoses: Secondary | ICD-10-CM

## 2015-05-25 DIAGNOSIS — Z882 Allergy status to sulfonamides status: Secondary | ICD-10-CM | POA: Diagnosis not present

## 2015-05-25 DIAGNOSIS — I25119 Atherosclerotic heart disease of native coronary artery with unspecified angina pectoris: Secondary | ICD-10-CM | POA: Diagnosis present

## 2015-05-25 DIAGNOSIS — E782 Mixed hyperlipidemia: Secondary | ICD-10-CM | POA: Diagnosis not present

## 2015-05-25 DIAGNOSIS — R21 Rash and other nonspecific skin eruption: Secondary | ICD-10-CM | POA: Diagnosis not present

## 2015-05-25 DIAGNOSIS — E039 Hypothyroidism, unspecified: Secondary | ICD-10-CM | POA: Diagnosis present

## 2015-05-25 DIAGNOSIS — Z880 Allergy status to penicillin: Secondary | ICD-10-CM | POA: Diagnosis not present

## 2015-05-25 DIAGNOSIS — M47899 Other spondylosis, site unspecified: Secondary | ICD-10-CM | POA: Diagnosis not present

## 2015-05-25 DIAGNOSIS — Z888 Allergy status to other drugs, medicaments and biological substances status: Secondary | ICD-10-CM | POA: Diagnosis not present

## 2015-05-25 DIAGNOSIS — Z7982 Long term (current) use of aspirin: Secondary | ICD-10-CM | POA: Diagnosis not present

## 2015-05-25 DIAGNOSIS — E785 Hyperlipidemia, unspecified: Secondary | ICD-10-CM | POA: Diagnosis present

## 2015-05-25 DIAGNOSIS — E038 Other specified hypothyroidism: Secondary | ICD-10-CM | POA: Diagnosis not present

## 2015-05-25 DIAGNOSIS — R0600 Dyspnea, unspecified: Secondary | ICD-10-CM | POA: Diagnosis not present

## 2015-05-25 DIAGNOSIS — K219 Gastro-esophageal reflux disease without esophagitis: Secondary | ICD-10-CM | POA: Diagnosis not present

## 2015-05-25 DIAGNOSIS — R5383 Other fatigue: Secondary | ICD-10-CM | POA: Diagnosis not present

## 2015-05-25 HISTORY — DX: Immune thrombocytopenic purpura: D69.3

## 2015-05-25 HISTORY — DX: Reserved for inherently not codable concepts without codable children: IMO0001

## 2015-05-25 LAB — COMPREHENSIVE METABOLIC PANEL
ALT: 21 U/L (ref 17–63)
AST: 22 U/L (ref 15–41)
Albumin: 4 g/dL (ref 3.5–5.0)
Alkaline Phosphatase: 102 U/L (ref 38–126)
Anion gap: 7 (ref 5–15)
BUN: 10 mg/dL (ref 6–20)
CALCIUM: 8.8 mg/dL — AB (ref 8.9–10.3)
CO2: 28 mmol/L (ref 22–32)
Chloride: 103 mmol/L (ref 101–111)
Creatinine, Ser: 0.89 mg/dL (ref 0.61–1.24)
GFR calc non Af Amer: >60 mL/min (ref >60)
Glucose, Bld: 108 mg/dL — ABNORMAL HIGH (ref 65–99)
POTASSIUM: 3.6 mmol/L (ref 3.5–5.1)
Sodium: 138 mmol/L (ref 135–145)
Total Bilirubin: 1 mg/dL (ref 0.3–1.2)
Total Protein: 6.5 g/dL (ref 6.5–8.1)

## 2015-05-25 LAB — CBC WITH DIFFERENTIAL/PLATELET
BASOS ABS: 0 10*3/uL (ref 0.0–0.1)
Basophils Relative: 1 % (ref 0–1)
Eosinophils Absolute: 0.2 10*3/uL (ref 0.0–0.7)
Eosinophils Relative: 3 % (ref 0–5)
HCT: 39.1 % (ref 39.0–52.0)
HEMOGLOBIN: 13.1 g/dL (ref 13.0–17.0)
Lymphocytes Relative: 15 % (ref 12–46)
Lymphs Abs: 0.7 10*3/uL (ref 0.7–4.0)
MCH: 29 pg (ref 26.0–34.0)
MCHC: 33.5 g/dL (ref 30.0–36.0)
MCV: 86.7 fL (ref 78.0–100.0)
Monocytes Absolute: 0.5 10*3/uL (ref 0.1–1.0)
Monocytes Relative: 10 % (ref 3–12)
NEUTROS ABS: 3.6 10*3/uL (ref 1.7–7.7)
Neutrophils Relative %: 72 % (ref 43–77)
RBC: 4.51 MIL/uL (ref 4.22–5.81)
RDW: 13 % (ref 11.5–15.5)
WBC: 5 10*3/uL (ref 4.0–10.5)

## 2015-05-25 LAB — ABO/RH: ABO/RH(D): O POS

## 2015-05-25 MED ORDER — ALBUTEROL SULFATE HFA 108 (90 BASE) MCG/ACT IN AERS: 2.0000 | INHALATION_SPRAY | Freq: Four times a day (QID) | RESPIRATORY_TRACT | Status: DC | PRN

## 2015-05-25 MED ORDER — PANTOPRAZOLE SODIUM 40 MG PO TBEC
40.0000 mg | DELAYED_RELEASE_TABLET | Freq: Every day | ORAL | Status: DC
  Administered 2015-05-26 – 2015-05-27 (×2): 40 mg via ORAL
  Filled 2015-05-25 (×2): qty 1

## 2015-05-25 MED ORDER — LEVOTHYROXINE SODIUM 100 MCG PO TABS
100.0000 ug | ORAL_TABLET | Freq: Every day | ORAL | Status: DC
  Administered 2015-05-26 – 2015-05-27 (×2): 100 ug via ORAL
  Filled 2015-05-25 (×2): qty 1

## 2015-05-25 MED ORDER — NADOLOL 40 MG PO TABS
40.0000 mg | ORAL_TABLET | Freq: Every day | ORAL | Status: DC
  Administered 2015-05-26 (×2): 40 mg via ORAL
  Filled 2015-05-25 (×4): qty 1

## 2015-05-25 MED ORDER — CITALOPRAM HYDROBROMIDE 20 MG PO TABS
20.0000 mg | ORAL_TABLET | ORAL | Status: DC
  Administered 2015-05-26 – 2015-05-27 (×2): 20 mg via ORAL
  Filled 2015-05-25 (×3): qty 1

## 2015-05-25 MED ORDER — HYDROCHLOROTHIAZIDE 25 MG PO TABS
25.0000 mg | ORAL_TABLET | Freq: Every day | ORAL | Status: DC
  Administered 2015-05-26 – 2015-05-27 (×2): 25 mg via ORAL
  Filled 2015-05-25 (×2): qty 1

## 2015-05-25 MED ORDER — SODIUM CHLORIDE 0.9 % IV SOLN
10.0000 mL/h | Freq: Once | INTRAVENOUS | Status: AC
  Administered 2015-05-25: 10 mL/h via INTRAVENOUS

## 2015-05-25 MED ORDER — ATORVASTATIN CALCIUM 80 MG PO TABS
80.0000 mg | ORAL_TABLET | Freq: Every day | ORAL | Status: DC
  Administered 2015-05-26 – 2015-05-27 (×2): 80 mg via ORAL
  Filled 2015-05-25 (×2): qty 1

## 2015-05-25 MED ORDER — ALPRAZOLAM 0.25 MG PO TABS
0.5000 mg | ORAL_TABLET | Freq: Once | ORAL | Status: AC
  Administered 2015-05-26: 0.5 mg via ORAL
  Filled 2015-05-25: qty 2

## 2015-05-25 MED ORDER — MORPHINE SULFATE ER 60 MG PO TBCR
60.0000 mg | EXTENDED_RELEASE_TABLET | Freq: Three times a day (TID) | ORAL | Status: DC
  Administered 2015-05-26 – 2015-05-27 (×4): 60 mg via ORAL
  Filled 2015-05-25 (×4): qty 1

## 2015-05-25 MED ORDER — PREDNISONE 20 MG PO TABS
120.0000 mg | ORAL_TABLET | Freq: Once | ORAL | Status: AC
  Administered 2015-05-26: 120 mg via ORAL
  Filled 2015-05-25: qty 6

## 2015-05-25 MED ORDER — MORPHINE SULFATE 15 MG PO TABS: 15.0000 mg | ORAL_TABLET | Freq: Four times a day (QID) | ORAL | Status: DC | PRN

## 2015-05-25 MED ORDER — DOXAZOSIN MESYLATE 4 MG PO TABS
4.0000 mg | ORAL_TABLET | Freq: Every day | ORAL | Status: DC
  Administered 2015-05-26 – 2015-05-27 (×2): 4 mg via ORAL
  Filled 2015-05-25 (×2): qty 1

## 2015-05-25 MED ORDER — HYDRALAZINE HCL 20 MG/ML IJ SOLN
20.0000 mg | INTRAMUSCULAR | Status: DC | PRN
  Filled 2015-05-25: qty 1

## 2015-05-25 MED ORDER — DOXYCYCLINE HYCLATE 100 MG PO CAPS: 100.0000 mg | ORAL_CAPSULE | Freq: Two times a day (BID) | ORAL | Status: DC

## 2015-05-25 MED ORDER — PREDNISONE 20 MG PO TABS
120.0000 mg | ORAL_TABLET | Freq: Every day | ORAL | Status: DC
  Administered 2015-05-26 – 2015-05-27 (×2): 120 mg via ORAL
  Filled 2015-05-25 (×2): qty 6

## 2015-05-25 NOTE — H&P (Signed)
[facility] History and Physical  TRUST LEH OAC:[id number] DOB: [birth year] DOA: 05/25/2015  Referring physician: EDP PCP: Sallee Lange, MD   Chief Complaint: Thrombocytopenia   HPI: [name] is a 64 y.o. male who presents to the ED after he went to his PCP for a rash of several days duration.  He also had a nose bleed and spontaneous gum bleeding.  Labs were drawn at his PCPs office which demonstrated thrombocytopenia.  He has a history of mild thrombocytopenia several years ago in the "90-100" platelet range that was apparently "due to his immune system attacking his platelets" per a "lab" that was drawn he says.  Review of Systems: Systems reviewed.  As above, otherwise negative  Past Medical History  Diagnosis Date  . Hypertension   . Coronary artery disease   . Chronic back pain   . Hypercholesteremia   . GERD (gastroesophageal reflux disease)   . Allergy   . Lumbar pain   . Hypothyroid   . Carpal tunnel syndrome   . Myocardial infarction 2009  . History of thrombocytopenia   . Degenerative disc disease    Past Surgical History  Procedure Laterality Date  . Neck surgery    . Lumbar fusion  2009  . Colonoscopy    . Coronary angioplasty with stent placement      2000, and 2004 has 3 stents  . Back surgery      5 lumbas disc with cervical and lumbar fusions  . Esophagogastroduodenoscopy (egd) with propofol N/A 03/06/2014    Procedure: ESOPHAGOGASTRODUODENOSCOPY (EGD) WITH PROPOFOL;  Surgeon: [name], MD;  Location: [facility];  Service: Endoscopy;  Laterality: N/A;  Venia Minks dilation N/A 03/06/2014    Procedure: MALONEY DILATION 54 french;  Surgeon: [name], MD;  Location: [facility];  Service: Endoscopy;  Laterality: N/A;  . Esophageal biopsy N/A 03/06/2014    Procedure: ESOPHAGEAL BIOPSIES;  Surgeon: [name], MD;  Location: [facility];  Service: Endoscopy;  Laterality: N/A;  . Colonoscopy with propofol N/A 03/06/2014    Procedure: COLONOSCOPY WITH  PROPOFOL;  Surgeon: [name], MD;  Location: [facility];  Service: Endoscopy;  Laterality: N/A;  in cecum at 0807; total withdrawal time 9 minutes   Social History:  reports that he quit smoking about 23 years ago. His smoking use included Cigarettes. He has a 40 pack-year smoking history. He has never used smokeless tobacco. He reports that he does not drink alcohol or use illicit drugs.  Allergies  Allergen Reactions  . Penicillins Other (See Comments)    "I passed out last time I had it and they told me not to take it again."  . Sulfa Antibiotics Swelling  . Zoloft [Sertraline Hcl] Other (See Comments)    Confusion     Family History  Problem Relation Age of Onset  . Heart attack Father      Prior to Admission medications   Medication Sig Start Date End Date Taking? Authorizing Provider  albuterol (PROVENTIL HFA;VENTOLIN HFA) 108 (90 BASE) MCG/ACT inhaler Inhale 2 puffs into the lungs every 6 (six) hours as needed for wheezing or shortness of breath. 05/25/15  Yes Kathyrn Drown, MD  atorvastatin (LIPITOR) 80 MG tablet Take 1 tablet (80 mg total) by mouth daily. 08/29/14 08/29/15 Yes Kathyrn Drown, MD  cetirizine (ZYRTEC) 10 MG tablet Take 10 mg by mouth at bedtime.     Yes Historical Provider, MD  citalopram (CELEXA) 20 MG tablet Take  1 tablet (20 mg total) by mouth every morning. 08/29/14  Yes Kathyrn Drown, MD  doxazosin (CARDURA) 4 MG tablet Take 1 tablet (4 mg total) by mouth every morning. 08/29/14  Yes Kathyrn Drown, MD  hydrochlorothiazide (HYDRODIURIL) 25 MG tablet TAKE ONE TABLET BY MOUTH ONCE DAILY. 10/20/14  Yes Kathyrn Drown, MD  levothyroxine (SYNTHROID, LEVOTHROID) 100 MCG tablet Take 1 tablet (100 mcg total) by mouth daily before breakfast. 08/29/14  Yes Kathyrn Drown, MD  morphine (MS CONTIN) 60 MG 12 hr tablet Take 60 mg by mouth every 8 (eight) hours. Scheduled doses for pain   Yes Historical Provider, MD  morphine (MSIR) 15 MG tablet Take 15 mg by mouth  every 6 (six) hours as needed for severe pain.   Yes Historical Provider, MD  Multiple Vitamin (MULTIVITAMIN) capsule Take 1 capsule by mouth daily.     Yes Historical Provider, MD  nadolol (CORGARD) 40 MG tablet Take 1 tablet (40 mg total) by mouth at bedtime. 08/29/14  Yes Kathyrn Drown, MD  nitroGLYCERIN (NITROSTAT) 0.4 MG SL tablet Place 0.4 mg under the tongue every 5 (five) minutes as needed for chest pain.   Yes Historical Provider, MD  pantoprazole (PROTONIX) 40 MG tablet Take 1 tablet (40 mg total) by mouth daily. 08/29/14  Yes Kathyrn Drown, MD  doxycycline (VIBRAMYCIN) 100 MG capsule Take 1 capsule (100 mg total) by mouth 2 (two) times daily. Patient not taking: Reported on 05/25/2015 05/25/15   Kathyrn Drown, MD  nitroGLYCERIN (NITROSTAT) 0.4 MG SL tablet Place 1 tablet (0.4 mg total) under the tongue every 5 (five) minutes x 3 doses as needed for chest pain (up to 3 doses). 01/03/12 03/03/14  Leona Carry Hope, PA-C   Physical Exam: Filed Vitals:   05/25/15 2350  BP: 187/109  Pulse: 83  Temp: 98.1 F (36.7 C)  Resp: 18    BP 187/109 mmHg  Pulse 83  Temp(Src) 98.1 F (36.7 C) (Oral)  Resp 18  Ht 5\' 11"  (1.803 m)  Wt 124.739 kg (275 lb)  BMI 38.37 kg/m2  SpO2 98%  General Appearance:    Alert, oriented, no distress, appears stated age  Head:    Normocephalic, atraumatic  Eyes:    PERRL, EOMI, sclera non-icteric        Nose:   Nares without drainage or epistaxis. Mucosa, turbinates normal  Throat:   Moist mucous membranes. Oropharynx without erythema or exudate.  Neck:   Supple. No carotid bruits.  No thyromegaly.  No lymphadenopathy.   Back:     No CVA tenderness, no spinal tenderness  Lungs:     Clear to auscultation bilaterally, without wheezes, rhonchi or rales  Chest wall:    No tenderness to palpitation  Heart:    Regular rate and rhythm without murmurs, gallops, rubs  Abdomen:     Soft, non-tender, nondistended, normal bowel sounds, no organomegaly  Genitalia:     deferred  Rectal:    deferred  Extremities:   No clubbing, cyanosis or edema.  Pulses:   2+ and symmetric all extremities  Skin:   Patient with a classic purpuric rash on BLE, some on UE with some bruising as well.  Lymph nodes:   Cervical, supraclavicular, and axillary nodes normal  Neurologic:   CNII-XII intact. Normal strength, sensation and reflexes      throughout    Labs on Admission:  Basic Metabolic Panel:  Recent Labs Lab 05/25/15 1148 05/25/15 1841  NA 139 138  K 3.9 3.6  CL 96* 103  CO2 24 28  GLUCOSE 102* 108*  BUN 10 10  CREATININE 0.81 0.89  CALCIUM 8.8 8.8*   Liver Function Tests:  Recent Labs Lab 05/25/15 1148 05/25/15 1841  AST 20 22  ALT 20 21  ALKPHOS 116 102  BILITOT 0.9 1.0  PROT 6.6 6.5  ALBUMIN  --  4.0   No results for input(s): LIPASE, AMYLASE in the last 168 hours. No results for input(s): AMMONIA in the last 168 hours. CBC:  Recent Labs Lab 05/25/15 1148 05/25/15 1841  WBC WILL FOLLOW 5.0  NEUTROABS WILL FOLLOW 3.6  HGB  --  13.1  HCT WILL FOLLOW 39.1  MCV  --  86.7  PLT  --  <5*   Cardiac Enzymes: No results for input(s): CKTOTAL, CKMB, CKMBINDEX, TROPONINI in the last 168 hours.  BNP (last 3 results) No results for input(s): PROBNP in the last 8760 hours. CBG: No results for input(s): GLUCAP in the last 168 hours.  Radiological Exams on Admission: No results found.  EKG: Independently reviewed.  Assessment/Plan Principal Problem:   ITP (idiopathic thrombocytopenic purpura) Active Problems:   Thrombocytopenia   1. ITP - Patient with classic appearing rash, platelets of less than 5k, no evidence of renal involvement or AMS to suggest TTP. 1. Dr. Earlie Server of heme/onc consulted 1. Ordered 1mg  / kg / day of prednisone as instructed, first dose tonight. 2. Patient getting 1 unit platelets as instructed 2. Repeat CBC in AM. 3. No DVT ppx due to no platelets.    Code Status: Full  Family Communication: Family at  bedside Disposition Plan: Admit to inpatient   Time spent: 34 min  GARDNER, JARED M. [facility] Pager [phone]  If 7AM-7PM, please contact the day team taking care of the patient [email or web address] Password [facility] 05/25/2015, 11:55 PM

## 2015-05-25 NOTE — ED Provider Notes (Signed)
CSN: [id number]     Arrival date & time 05/25/15  1819 History   First MD Initiated Contact with Patient 05/25/15 2202     Chief Complaint  Patient presents with  . Abnormal Lab    The patient said he went to his PCP for a rash and when they did labs he was sent here for low Platelets.  The patient did say he had a nose bleed and his gums.  Both have resolved.      (Consider location/radiation/quality/duration/timing/severity/associated sxs/prior Treatment) Patient is a 64 y.o. male presenting with rash. The history is provided by the patient. No language interpreter was used.  Rash Location:  Full body Severity:  Severe Onset quality:  Gradual Duration:  2 days Timing:  Constant Progression:  Worsening Chronicity:  New Relieved by:  Nothing Worsened by:  Nothing tried Ineffective treatments:  None tried Associated symptoms: fatigue and shortness of breath   Associated symptoms: no abdominal pain, no diarrhea, no fever, no headaches, no nausea, no periorbital edema, no sore throat, no throat swelling, no tongue swelling, no URI, not vomiting and not wheezing   Associated symptoms comment:  Epistaxis 2 days ago, bleeding gums    Past Medical History  Diagnosis Date  . Hypertension   . Coronary artery disease   . Chronic back pain   . Hypercholesteremia   . GERD (gastroesophageal reflux disease)   . Allergy   . Lumbar pain   . Hypothyroid   . Carpal tunnel syndrome   . Myocardial infarction 2009  . History of thrombocytopenia   . Degenerative disc disease    Past Surgical History  Procedure Laterality Date  . Neck surgery    . Lumbar fusion  2009  . Colonoscopy    . Coronary angioplasty with stent placement      2000, and 2004 has 3 stents  . Back surgery      5 lumbas disc with cervical and lumbar fusions  . Esophagogastroduodenoscopy (egd) with propofol N/A 03/06/2014    Procedure: ESOPHAGOGASTRODUODENOSCOPY (EGD) WITH PROPOFOL;  Surgeon: [name], MD;   Location: [facility];  Service: Endoscopy;  Laterality: N/A;  Venia Minks dilation N/A 03/06/2014    Procedure: MALONEY DILATION 54 french;  Surgeon: [name], MD;  Location: [facility];  Service: Endoscopy;  Laterality: N/A;  . Esophageal biopsy N/A 03/06/2014    Procedure: ESOPHAGEAL BIOPSIES;  Surgeon: [name], MD;  Location: [facility];  Service: Endoscopy;  Laterality: N/A;  . Colonoscopy with propofol N/A 03/06/2014    Procedure: COLONOSCOPY WITH PROPOFOL;  Surgeon: [name], MD;  Location: [facility];  Service: Endoscopy;  Laterality: N/A;  in cecum at 0807; total withdrawal time 9 minutes   Family History  Problem Relation Age of Onset  . Heart attack Father    History  Substance Use Topics  . Smoking status: Former Smoker -- 2.00 packs/day for 20 years    Types: Cigarettes    Quit date: 07/06/1991  . Smokeless tobacco: Never Used  . Alcohol Use: No     Comment: occassional    Review of Systems  Constitutional: Positive for fatigue. Negative for fever, activity change and appetite change.  HENT: Negative for congestion, facial swelling, rhinorrhea, sore throat and trouble swallowing.   Eyes: Negative for photophobia and pain.  Respiratory: Positive for shortness of breath. Negative for cough, chest tightness and wheezing.   Cardiovascular: Negative for chest pain and leg swelling.  Gastrointestinal: Negative for nausea,  vomiting, abdominal pain, diarrhea and constipation.  Endocrine: Negative for polydipsia and polyuria.  Genitourinary: Negative for dysuria, urgency, decreased urine volume and difficulty urinating.  Musculoskeletal: Negative for back pain and gait problem.  Skin: Positive for rash. Negative for color change and wound.  Allergic/Immunologic: Negative for immunocompromised state.  Neurological: Negative for dizziness, facial asymmetry, speech difficulty, weakness, numbness and headaches.  Psychiatric/Behavioral: Negative for confusion, decreased concentration  and agitation.      Allergies  Penicillins; Sulfa antibiotics; and Zoloft  Home Medications   Prior to Admission medications   Medication Sig Start Date End Date Taking? Authorizing Provider  albuterol (PROVENTIL HFA;VENTOLIN HFA) 108 (90 BASE) MCG/ACT inhaler Inhale 2 puffs into the lungs every 6 (six) hours as needed for wheezing or shortness of breath. 05/25/15  Yes Kathyrn Drown, MD  atorvastatin (LIPITOR) 80 MG tablet Take 1 tablet (80 mg total) by mouth daily. 08/29/14 08/29/15 Yes Kathyrn Drown, MD  cetirizine (ZYRTEC) 10 MG tablet Take 10 mg by mouth at bedtime.     Yes Historical Provider, MD  citalopram (CELEXA) 20 MG tablet Take 1 tablet (20 mg total) by mouth every morning. 08/29/14  Yes Kathyrn Drown, MD  doxazosin (CARDURA) 4 MG tablet Take 1 tablet (4 mg total) by mouth every morning. 08/29/14  Yes Kathyrn Drown, MD  hydrochlorothiazide (HYDRODIURIL) 25 MG tablet TAKE ONE TABLET BY MOUTH ONCE DAILY. 10/20/14  Yes Kathyrn Drown, MD  levothyroxine (SYNTHROID, LEVOTHROID) 100 MCG tablet Take 1 tablet (100 mcg total) by mouth daily before breakfast. 08/29/14  Yes Kathyrn Drown, MD  morphine (MS CONTIN) 60 MG 12 hr tablet Take 60 mg by mouth every 8 (eight) hours. Scheduled doses for pain   Yes Historical Provider, MD  morphine (MSIR) 15 MG tablet Take 15 mg by mouth every 6 (six) hours as needed for severe pain.   Yes Historical Provider, MD  Multiple Vitamin (MULTIVITAMIN) capsule Take 1 capsule by mouth daily.     Yes Historical Provider, MD  nadolol (CORGARD) 40 MG tablet Take 1 tablet (40 mg total) by mouth at bedtime. 08/29/14  Yes Kathyrn Drown, MD  nitroGLYCERIN (NITROSTAT) 0.4 MG SL tablet Place 0.4 mg under the tongue every 5 (five) minutes as needed for chest pain.   Yes Historical Provider, MD  pantoprazole (PROTONIX) 40 MG tablet Take 1 tablet (40 mg total) by mouth daily. 08/29/14  Yes Kathyrn Drown, MD  doxycycline (VIBRAMYCIN) 100 MG capsule Take 1 capsule  (100 mg total) by mouth 2 (two) times daily. Patient not taking: Reported on 05/25/2015 05/25/15   Kathyrn Drown, MD  nitroGLYCERIN (NITROSTAT) 0.4 MG SL tablet Place 1 tablet (0.4 mg total) under the tongue every 5 (five) minutes x 3 doses as needed for chest pain (up to 3 doses). 01/03/12 03/03/14  Jessica A Hope, PA-C   BP 149/96 mmHg  Pulse 81  Temp(Src) 98.1 F (36.7 C) (Oral)  Resp 18  Ht 5\' 11"  (1.803 m)  Wt 275 lb (124.739 kg)  BMI 38.37 kg/m2  SpO2 97% Physical Exam  Constitutional: He is oriented to person, place, and time. He appears well-developed and well-nourished. No distress.  HENT:  Head: Normocephalic and atraumatic.  Mouth/Throat: No oropharyngeal exudate.  Eyes: Pupils are equal, round, and reactive to light.  Neck: Normal range of motion. Neck supple.  Cardiovascular: Normal rate, regular rhythm and normal heart sounds.  Exam reveals no gallop and no friction rub.   No  murmur heard. Pulmonary/Chest: Effort normal and breath sounds normal. No respiratory distress. He has no wheezes. He has no rales.  Abdominal: Soft. Bowel sounds are normal. He exhibits no distension and no mass. There is no tenderness. There is no rebound and no guarding.  Musculoskeletal: Normal range of motion. He exhibits no edema or tenderness.  Neurological: He is alert and oriented to person, place, and time.  Skin: Skin is warm and dry. Petechiae (generalized) noted.  Psychiatric: He has a normal mood and affect.    ED Course  Procedures (including critical care time) Labs Review Labs Reviewed  CBC WITH DIFFERENTIAL/PLATELET - Abnormal; Notable for the following:    Platelets <5 (*)    All other components within normal limits  COMPREHENSIVE METABOLIC PANEL - Abnormal; Notable for the following:    Glucose, Bld 108 (*)    Calcium 8.8 (*)    All other components within normal limits  PROTIME-INR  CBC  PREPARE PLATELET PHERESIS  ABO/RH    Imaging Review Dg Chest 2 View  05/26/2015    CLINICAL DATA:  Dyspnea and weakness.  Thrombocytopenia.  EXAM: CHEST  2 VIEW  COMPARISON:  09/15/2013  FINDINGS: There is mild unchanged cardiomegaly. The lungs are clear. The pulmonary vasculature is normal. There is no pleural effusion. Hilar and mediastinal contours are unremarkable and unchanged.  IMPRESSION: Mild unchanged cardiomegaly.  No acute findings.   Electronically Signed   By: Andreas Newport M.D.   On: 05/26/2015 00:07     EKG Interpretation None      MDM   Final diagnoses:  Thrombocytopenia    Pt is a 64 y.o. male with Pmhx as above who presents with thrombocytopenia from PCP's office where he presented today for rash.  Patient states he has had low platelets in past and was followed by oncology for several years and that he was told that it was his immune system attacking the platelets.  2 days ago he had nosebleeds and has had easily, bleeding gums when he brushes his teeth.  He seen no blood in his urine or stool, he has had fatigue and shortness of breath and no fevers, chills, night sweats or weight changes.  On physical exam he is a diffuse petechial rash.  Abdominal exam is benign.  No purpura.  Platelets on labs or less than 5 wbc's and hemoglobin is normal.  No acute CMP changes.  PT and INR also normal.  Chest x-ray shows mild changes, cardiomegaly.  I spoke with the hospitalist service who will admit.  Spoke with Dr. Inda Merlin of hematology oncology, who recommended giving a one-time dose of 1 minute per By mouth prednisone as well as transfusion of 1 unit of platelets.         Ernestina Patches, MD 05/26/15 [phone]

## 2015-05-25 NOTE — ED Notes (Signed)
The patient said he went to his PCP for a rash and when they did labs he was sent here for low Platelets.  The patient did say he had a nose bleed and his gums.  Both have resolved.   Dr. Sallee Lange sent him here for low platelet count.  He denies any pain but he has been light headed at times.

## 2015-05-25 NOTE — Progress Notes (Addendum)
   Subjective:    Patient ID: [name], male    DOB: 1951/07/17, 64 y.o.   MRN: [id number]  Rash This is a new problem. The current episode started in the past 7 days. The problem is unchanged. The rash is diffuse. The rash is characterized by redness and itchiness. He was exposed to shellfish. Past treatments include antihistamine. The treatment provided mild relief.   patient states the rash started up over the weekend started off with a few dots noted progressively spread on the arms legs chest abdomen and back he does have cardiac history has a history of platelets being slightly low he does take aspirin daily Patient states that he needs a refill on his Ventolin inhaler.  Patient states he did have a nosebleed over the weekend but it stopped he denies hematuria he denies rectal bleeding denies fever chills denies bites denies any increasing shortness of breath energy level fair  Review of Systems  Skin: Positive for rash.   see above denies wheezing states use albuterol when necessary needs a refill 25 minutes spent with patient discussing multiple issues regarding petechiae    Objective:   Physical Exam  Lungs are clear hearts regular no crackles significant amount of petechiae on arms/legs/torso Pt unable to give urine wife will bring back specimen  noted but no bullae Patient appears okay does not appear to be in any distress     Assessment & Plan:  He was advised to stop aspirin If any serious bleeding immediately go to the ER Stat lab work ordered, should be back later this afternoon Doxycycline twice a day to cover for the possibility of tick related illness May need hematology referral may need referral to the hospital depending on results of the test  STAT labs finally returned at 5:20 pm- severe thrombocytopenia, other than petechiae patient asymptomatic, going to [facility] ED immedeately. Triage called and labs faxed as well  WBC and Hg normal, Plt <5 Urine 2 to 4 RBC  per HPF ( specimen turned in after 4 pm)

## 2015-05-25 NOTE — ED Notes (Signed)
Dr. Gardner at bedside 

## 2015-05-25 NOTE — ED Notes (Signed)
Pt c/o rash x 7 days; denies being bite or any new clothing. Was seen at primary care office today, had labs drawn, and was re-contacted to follow up in the ED for platelet count less than 5,000. Bumps noted from head to toe. Reports hx of low platelet count in the past without intervention. On Saturday, pt reports a nosebleed. Also reports bleeding to gums when brushing teeth. Pt is not on blood thinners, denies rectal bleeding or hematuria.

## 2015-05-26 ENCOUNTER — Encounter (HOSPITAL_COMMUNITY): Payer: Self-pay | Admitting: General Practice

## 2015-05-26 DIAGNOSIS — R21 Rash and other nonspecific skin eruption: Secondary | ICD-10-CM

## 2015-05-26 DIAGNOSIS — D693 Immune thrombocytopenic purpura: Principal | ICD-10-CM

## 2015-05-26 DIAGNOSIS — R233 Spontaneous ecchymoses: Secondary | ICD-10-CM

## 2015-05-26 LAB — PREPARE PLATELET PHERESIS: UNIT DIVISION: 0

## 2015-05-26 LAB — BASIC METABOLIC PANEL
BUN/Creatinine Ratio: 12 (ref 10–22)
BUN: 10 mg/dL (ref 8–27)
CO2: 24 mmol/L (ref 18–29)
Calcium: 8.8 mg/dL (ref 8.6–10.2)
Chloride: 96 mmol/L — ABNORMAL LOW (ref 97–108)
Creatinine, Ser: 0.81 mg/dL (ref 0.76–1.27)
GFR calc non Af Amer: 94 mL/min/{1.73_m2} (ref >59)
GFR, EST AFRICAN AMERICAN: 109 mL/min/{1.73_m2} (ref >59)
GLUCOSE: 102 mg/dL — AB (ref 65–99)
Potassium: 3.9 mmol/L (ref 3.5–5.2)
Sodium: 139 mmol/L (ref 134–144)

## 2015-05-26 LAB — POCT URINALYSIS DIPSTICK
RBC UA: 250
Spec Grav, UA: <=1.005
pH, UA: 6

## 2015-05-26 LAB — CBC WITH DIFFERENTIAL/PLATELET
BASOS ABS: 0 10*3/uL (ref 0.0–0.2)
Basos: 1 %
EOS (ABSOLUTE): 0.1 10*3/uL (ref 0.0–0.4)
Eos: 2 %
Hematocrit: 39.2 % (ref 37.5–51.0)
Hemoglobin: 13.5 g/dL (ref 12.6–17.7)
Immature Grans (Abs): 0 10*3/uL (ref 0.0–0.1)
Immature Granulocytes: 0 %
LYMPHS ABS: 0.7 10*3/uL (ref 0.7–3.1)
Lymphs: 13 %
MCH: 29.3 pg (ref 26.6–33.0)
MCHC: 34.4 g/dL (ref 31.5–35.7)
MCV: 85 fL (ref 79–97)
Monocytes Absolute: 0.4 10*3/uL (ref 0.1–0.9)
Monocytes: 7 %
NEUTROS ABS: 3.9 10*3/uL (ref 1.4–7.0)
Neutrophils: 77 %
Platelets: <5 10*3/uL — CL (ref 150–379)
RBC: 4.6 x10E6/uL (ref 4.14–5.80)
RDW: 13.6 % (ref 12.3–15.4)
WBC: 5.1 10*3/uL (ref 3.4–10.8)

## 2015-05-26 LAB — PROTIME-INR
INR: 1.1 (ref 0.00–1.49)
PROTHROMBIN TIME: 14.4 s (ref 11.6–15.2)

## 2015-05-26 LAB — CBC
HCT: 37.5 % — ABNORMAL LOW (ref 39.0–52.0)
HCT: 36.8 % — ABNORMAL LOW (ref 39.0–52.0)
HCT: 38.6 % — ABNORMAL LOW (ref 39.0–52.0)
HEMOGLOBIN: 12.5 g/dL — AB (ref 13.0–17.0)
Hemoglobin: 12.3 g/dL — ABNORMAL LOW (ref 13.0–17.0)
Hemoglobin: 12.9 g/dL — ABNORMAL LOW (ref 13.0–17.0)
MCH: 28.9 pg (ref 26.0–34.0)
MCH: 28.9 pg (ref 26.0–34.0)
MCH: 29.1 pg (ref 26.0–34.0)
MCHC: 33.3 g/dL (ref 30.0–36.0)
MCHC: 33.4 g/dL (ref 30.0–36.0)
MCHC: 33.4 g/dL (ref 30.0–36.0)
MCV: 86.8 fL (ref 78.0–100.0)
MCV: 86.6 fL (ref 78.0–100.0)
MCV: 86.9 fL (ref 78.0–100.0)
PLATELETS: 7 10*3/uL — AB (ref 150–400)
PLATELETS: 12 10*3/uL — AB (ref 150–400)
Platelets: 7 10*3/uL — CL (ref 150–400)
RBC: 4.32 MIL/uL (ref 4.22–5.81)
RBC: 4.25 MIL/uL (ref 4.22–5.81)
RBC: 4.44 MIL/uL (ref 4.22–5.81)
RDW: 12.9 % (ref 11.5–15.5)
RDW: 12.9 % (ref 11.5–15.5)
RDW: 13 % (ref 11.5–15.5)
WBC: 5.7 10*3/uL (ref 4.0–10.5)
WBC: 4.1 10*3/uL (ref 4.0–10.5)
WBC: 10.6 10*3/uL — ABNORMAL HIGH (ref 4.0–10.5)

## 2015-05-26 LAB — HEPATIC FUNCTION PANEL
ALT: 20 IU/L (ref 0–44)
AST: 20 IU/L (ref 0–40)
Albumin: 4.4 g/dL (ref 3.6–4.8)
Alkaline Phosphatase: 116 IU/L (ref 39–117)
BILIRUBIN TOTAL: 0.9 mg/dL (ref 0.0–1.2)
Bilirubin, Direct: 0.22 mg/dL (ref 0.00–0.40)
Total Protein: 6.6 g/dL (ref 6.0–8.5)

## 2015-05-26 LAB — DIC (DISSEMINATED INTRAVASCULAR COAGULATION)PANEL
D-Dimer, Quant: 1.28 ug/mL-FEU — ABNORMAL HIGH (ref 0.00–0.48)
INR: 1.15 (ref 0.00–1.49)
Platelets: 6 10*3/uL — CL (ref 150–400)
Prothrombin Time: 14.9 seconds (ref 11.6–15.2)
aPTT: 30 seconds (ref 24–37)

## 2015-05-26 LAB — PT AND PTT
APTT: 29 s (ref 24–33)
INR: 1 (ref 0.8–1.2)
Prothrombin Time: 10.4 s (ref 9.1–12.0)

## 2015-05-26 LAB — DIC (DISSEMINATED INTRAVASCULAR COAGULATION) PANEL
Fibrinogen: 444 mg/dL (ref 204–475)
SMEAR REVIEW: NONE SEEN

## 2015-05-26 LAB — SAVE SMEAR

## 2015-05-26 LAB — PATHOLOGIST SMEAR REVIEW

## 2015-05-26 MED ORDER — ALBUTEROL SULFATE (2.5 MG/3ML) 0.083% IN NEBU: 2.5000 mg | INHALATION_SOLUTION | Freq: Four times a day (QID) | RESPIRATORY_TRACT | Status: DC | PRN

## 2015-05-26 NOTE — Consult Note (Signed)
[place]  Telephone:([phone]) [place] NOTE  BROUGHTON EPPINGER                                MR#: [id number]  DOB: 04-08-1951                        CSN#: [id number]  Patient Care Team: Kathyrn Drown, MD as PCP - General (Family Medicine)   Referring MD: [facility]    Reason for Consult: Thrombocytopenia  [name] 64 y.o. male admitted directly from his PCP office on 05/25/15 after presenting to his office with 7-day history of progressive, diffuse, pruritic, petechial rash, and spontaneous nose bleeding. He reports these symptoms began after eating shellfish. He denied any other form of bleeding such as hematemesis, hematuria, melena or hematochezia.He was found to have abnormal CBC  with platelet count of less than 5,000. The patient denies history of liver disease, risk factors for HIV. Denies exposure to heparin, Lovenox. Denies any history of cardiac murmur or prior cardiovascular surgery. Denies recent new medications. He takes daily ASA (now on hold)  but denies any NSAIDs intake. He denies prior blood or platelet transfusions.Denies any ticks or other insect bites. Patient reports he has known thrombocytopenia dating back to greater than 12-13 yrs. At the time he was seen by Dr. Tressie Stalker, who diagnosed him "with some type of autoimmune disease". He never had a bone marrow biopsy. He reports baseline platelets between 90's and 100's. Values going back to 07/2008 show a platelet count of 99k. In March of 2015 were 124k. WBC and Hb are stable. PT, PTT and INR are normal.  Smear has been ordered for review. He was initiated on prednisone 39m/kg/d=120 mg, now at 7,000. He also received 1 unit of platelets on 7/25 We were kindly requested to see the patient in consultation with recommendations  PMH:  Past Medical History  Diagnosis Date  . Hypertension   . Coronary artery disease   . Chronic back pain   . Hypercholesteremia   .  GERD (gastroesophageal reflux disease)   . Allergy   . Lumbar pain   . Hypothyroid   . Carpal tunnel syndrome   . Myocardial infarction 2009  . History of thrombocytopenia   . Degenerative disc disease     Surgeries:  Past Surgical History  Procedure Laterality Date  . Neck surgery    . Lumbar fusion  2009  . Colonoscopy    . Coronary angioplasty with stent placement      2000, and 2004 has 3 stents  . Back surgery      5 lumbas disc with cervical and lumbar fusions  . Esophagogastroduodenoscopy (egd) with propofol N/A 03/06/2014    Procedure: ESOPHAGOGASTRODUODENOSCOPY (EGD) WITH PROPOFOL;  Surgeon: NRogene Houston MD;  Location: [facility];  Service: Endoscopy;  Laterality: N/A;  .Venia Minksdilation N/A 03/06/2014    Procedure: MALONEY DILATION 54 french;  Surgeon: NRogene Houston MD;  Location: [facility];  Service: Endoscopy;  Laterality: N/A;  . Esophageal biopsy N/A 03/06/2014    Procedure: ESOPHAGEAL BIOPSIES;  Surgeon: NRogene Houston MD;  Location: [facility];  Service: Endoscopy;  Laterality: N/A;  . Colonoscopy with propofol N/A 03/06/2014    Procedure: COLONOSCOPY WITH PROPOFOL;  Surgeon: NRogene Houston MD;  Location: [facility];  Service: Endoscopy;  Laterality:  N/A;  in cecum at 0807; total withdrawal time 9 minutes    Allergies:  Allergies  Allergen Reactions  . Penicillins Other (See Comments)    "I passed out last time I had it and they told me not to take it again."  . Sulfa Antibiotics Swelling  . Zoloft [Sertraline Hcl] Other (See Comments)    Confusion     Medications:    . atorvastatin  80 mg Oral Daily  . citalopram  20 mg Oral [id number]  . doxazosin  4 mg Oral Daily  . hydrochlorothiazide  25 mg Oral Daily  . levothyroxine  100 mcg Oral QAC breakfast  . morphine  60 mg Oral 3 times per day  . nadolol  40 mg Oral QHS  . pantoprazole  40 mg Oral Daily  . predniSONE  120 mg Oral Q breakfast    QQV:ZDGLOVFIE, hydrALAZINE, morphine  ROS: Constitutional: Denies  fevers, chills or abnormal night sweats Eyes: Denies blurriness of vision, double vision or watery eyes Ears, nose, mouth, throat, and face: Denies mucositis or sore throat Respiratory: Denies cough, dyspnea or wheezes Cardiovascular: Denies palpitation, chest discomfort or lower extremity swelling Gastrointestinal:  Denies nausea, heartburn or change in bowel habits. Prior history of esophagitis due to NSAIDs, now resolved after discontinuation Skin:difuse petechial skin rashes as per HPI Lymphatics: Denies new lymphadenopathy or easy bruising Neurological:Denies numbness, tingling or new weaknesses. Chronic back pain Behavioral/Psych: Mood is stable, no new changes  All other systems were reviewed with the patient and are negative.    Family History:    Family History  Problem Relation Age of Onset  . Heart attack Father     No family history of hematological  disorders.  Social History:  reports that he quit smoking about 23 years ago. His smoking use included Cigarettes. He has a 40 pack-year smoking history. He has never used smokeless tobacco. He reports that he does not drink alcohol or use illicit drugs.. Lives in [place]. Retired Systems developer  Physical Exam    ECOG PERFORMANCE STATUS:1  Filed Vitals:   05/26/15 0616  BP: 142/69  Pulse: 65  Temp: 98.2 F (36.8 C)  Resp:    Filed Weights   05/25/15 1834  Weight: 275 lb (124.739 kg)    GENERAL:alert, no distress and comfortable SKIN: diffuse petechial rash on extremities, abdomen and trunk. EYES: normal, conjunctiva are pink and non-injected, sclera clear OROPHARYNX:no exudate, no erythema and lips, buccal mucosa, and tongue normal  NECK: supple, thyroid normal size, non-tender, without nodularity LYMPH:  no palpable lymphadenopathy in the cervical, axillary or inguinal area LUNGS: clear to auscultation and percussion with normal breathing effort HEART: regular rate & rhythm and no murmurs and no lower  extremity edema ABDOMEN: soft, non-tender and normal bowel sounds. No palpable hepatosplenomegaly. Musculoskeletal:no cyanosis of digits and no clubbing  PSYCH: alert & oriented x 3 with fluent speech NEURO: no focal motor/sensory deficits   Labs:    Recent Labs Lab 05/25/15 1148 05/25/15 1841 05/26/15 0529  WBC 5.1 5.0 5.7  HGB  --  13.1 12.5*  HCT 39.2 39.1 37.5*  PLT  --  <5* 7*  MCV  --  86.7 86.8  MCH 29.3 29.0 28.9  MCHC 34.4 33.5 33.3  RDW 13.6 13.0 12.9  LYMPHSABS 0.7 0.7  --   MONOABS  --  0.5  --   EOSABS  --  0.2  --   BASOSABS 0.0 0.0  --  Recent Labs Lab 05/25/15 1148 05/25/15 1841  NA 139 138  K 3.9 3.6  CL 96* 103  CO2 24 28  GLUCOSE 102* 108*  BUN 10 10  CREATININE 0.81 0.89  CALCIUM 8.8 8.8*  AST 20 22  ALT 20 21  ALKPHOS 116 102  BILITOT 0.9 1.0        Component Value Date/Time   BILITOT 1.0 05/25/2015 1841   BILITOT 0.9 05/25/2015 1148   BILIDIR 0.22 05/25/2015 1148   BILIDIR 0.1 01/14/2014 0947   IBILI 0.4 01/14/2014 0947      Recent Labs Lab 05/25/15 1148 05/25/15 2300  INR 1.0 1.10    No results for input(s): DDIMER in the last 72 hours.   Anemia panel:  No results for input(s): VITAMINB12, FOLATE, FERRITIN, TIBC, IRON, RETICCTPCT in the last 72 hours.  Urinalysis    Component Value Date/Time   COLORURINE YELLOW 06/13/2011 2348   APPEARANCEUR CLEAR 06/13/2011 2348   LABSPEC 1.020 06/13/2011 2348   PHURINE 6.5 06/13/2011 2348   GLUCOSEU NEGATIVE 06/13/2011 2348   HGBUR NEGATIVE 06/13/2011 2348   BILIRUBINUR NEGATIVE 06/13/2011 2348   KETONESUR NEGATIVE 06/13/2011 2348   PROTEINUR NEGATIVE 06/13/2011 2348   UROBILINOGEN 0.2 06/13/2011 2348   NITRITE NEGATIVE 06/13/2011 2348   LEUKOCYTESUR small (1+)* 05/26/2015 0840    Drugs of Abuse  No results found for: LABOPIA, COCAINSCRNUR, LABBENZ, AMPHETMU, THCU, LABBARB   Imaging Studies:  Dg Chest 2 View  05/26/2015   CLINICAL DATA:  Dyspnea and  weakness.  Thrombocytopenia.  EXAM: CHEST  2 VIEW  COMPARISON:  09/15/2013  FINDINGS: There is mild unchanged cardiomegaly. The lungs are clear. The pulmonary vasculature is normal. There is no pleural effusion. Hilar and mediastinal contours are unremarkable and unchanged.  IMPRESSION: Mild unchanged cardiomegaly.  No acute findings.   Electronically Signed   By: Andreas Newport M.D.   On: 05/26/2015 00:07     A/P: 64 y.o. male with :   Thrombocytopenia:  Petechial Rash Patient reports he has known thrombocytopenia dating back to greater than 12-13 yrs.  At the time he was seen by Dr. Tressie Stalker, who diagnosed him "with some type of autoimmune disease". Will try to obtain these records. He was admitted with petechial rash and epistaxis He was initiated on Prednisone 120 mg and 1 unit of platelets on 7/25 Platelet count at this time is 7,000 Agree with holding ASA. Will check a blood smear to rule out schistocytes. Renal functions are normal.  Check DIC.  DVT prophylaxis Holding anticoagulants for now   Full Code Rondel Jumbo, PA-C 05/26/2015 33:60 AM   64 year old gentleman with history of thrombocytopenia that have been relatively mild and fluctuating for many years. He presented with acute exacerbation with a blood count of less than 7000. He had complaints of petechiae and epistaxis. He was started on prednisone as well as received 1 unit of transfusion.  Clinically he feels well without any bleeding this afternoon. He does not report any epistaxis, hematochezia or melanoma. He does not report any alteration of his mental status or confusion. He does not report any medication or supplements.  On physical examination, laboratory gentleman not in any distress. Head is normocephalic without any trauma. Neck was supple without any lymphadenopathy. Sclerae anicteric. Lungs are clear and heart was regular rate and rhythm. Abdomen soft without any hepatosplenomegaly. Extremities had no  edema. Skin showed diffuse petechial rash with ecchymosis.  Impression and recommendation:  64 year old gentleman with severe thrombocytopenia likely  ITP. Differential diagnosis includes medication, reactive findings, MDS, leukemia with those are extremely unlikely. TTP, HUS, DIC are extremely unlikely.  From a management standpoint, I agree with current prednisone dose and I will hold off on any platelet transfusion unless he has active bleeding.  If his blood counts show signs of recovery in the next 24 hours with a platelet counts close to 22,000it will be reasonable to discharge him with a follow-up as an outpatient. I will leave him on the prednisone of 120 mg daily upon his discharge.  I discussed with hem alternative treatment options if he is steroid refractory. These would include IVIG, rituximab and  splenectomy.  We will monitor his counts in the next 24 hours and we'll arrange follow-up upon discharge if his plateletsstart to recover.  [facility] MD 05/26/2015

## 2015-05-26 NOTE — Progress Notes (Signed)
[facility] PROGRESS NOTE  [name] ZDG:[id number] DOB: [birth year] DOA: 05/25/2015 PCP: Sallee Lange, MD  Assessment/Plan: 1-Thrombocytopenia; possible ITP;  Started on prednisone. Continue with 120 mg daily.  Received one unit platelet. Platelet this am at 7.  Discussed with Dr Julien Nordmann, continue with prednisone, further platelet transfusion if evidence of bleeding, CBC Q 12 hour. Oncology team will see patient today.   2-HTN; Continue with atenolol, HCTZ.   3-Hypothyroidism:  Continue with synthroid.   4-H/O CAD;  Continue with nadolol, HCTZ.  Continue with statins.   5-Chronic pain:  Continue with morphine Q 12 hours.   6-GI prophylaxis: Protonix.    Code Status: Full Code.  Family Communication: Care discussed with patient.  Disposition Plan: Remain inpatient.    Consultants:  Dr Julien Nordmann,  Procedures:  none  Antibiotics: None  HPI/Subjective: Patient alert, oriented times 3. He denies melena, hematochezia or nose bleed.    Objective: Filed Vitals:   05/26/15 0616  BP: 142/69  Pulse: 65  Temp: 98.2 F (36.8 C)  Resp:     Intake/Output Summary (Last 24 hours) at 05/26/15 0915 Last data filed at 05/26/15 0531  Gross per 24 hour  Intake     56 ml  Output      0 ml  Net     56 ml   Filed Weights   05/25/15 1834  Weight: 124.739 kg (275 lb)    Exam:   General:  Alert in no distress.   Cardiovascular: S 1, S 2 RRR  Respiratory: CTA  Abdomen: BS present, soft, nt  Musculoskeletal: no edema  Data Reviewed: Basic Metabolic Panel:  Recent Labs Lab 05/25/15 1148 05/25/15 1841  NA 139 138  K 3.9 3.6  CL 96* 103  CO2 24 28  GLUCOSE 102* 108*  BUN 10 10  CREATININE 0.81 0.89  CALCIUM 8.8 8.8*   Liver Function Tests:  Recent Labs Lab 05/25/15 1148 05/25/15 1841  AST 20 22  ALT 20 21  ALKPHOS 116 102  BILITOT 0.9 1.0  PROT 6.6 6.5  ALBUMIN  --  4.0   No results for input(s): LIPASE, AMYLASE in the last 168  hours. No results for input(s): AMMONIA in the last 168 hours. CBC:  Recent Labs Lab 05/25/15 1148 05/25/15 1841 05/26/15 0529  WBC 5.1 5.0 5.7  NEUTROABS 3.9 3.6  --   HGB  --  13.1 12.5*  HCT 39.2 39.1 37.5*  MCV  --  86.7 86.8  PLT  --  <5* 7*   Cardiac Enzymes: No results for input(s): CKTOTAL, CKMB, CKMBINDEX, TROPONINI in the last 168 hours. BNP (last 3 results) No results for input(s): BNP in the last 8760 hours.  ProBNP (last 3 results) No results for input(s): PROBNP in the last 8760 hours.  CBG: No results for input(s): GLUCAP in the last 168 hours.  No results found for this or any previous visit (from the past 240 hour(s)).   Studies: Dg Chest 2 View  05/26/2015   CLINICAL DATA:  Dyspnea and weakness.  Thrombocytopenia.  EXAM: CHEST  2 VIEW  COMPARISON:  09/15/2013  FINDINGS: There is mild unchanged cardiomegaly. The lungs are clear. The pulmonary vasculature is normal. There is no pleural effusion. Hilar and mediastinal contours are unremarkable and unchanged.  IMPRESSION: Mild unchanged cardiomegaly.  No acute findings.   Electronically Signed   By: Andreas Newport M.D.   On: 05/26/2015 00:07    Scheduled Meds: . atorvastatin  80 mg Oral  Daily  . citalopram  20 mg Oral [id number]  . doxazosin  4 mg Oral Daily  . hydrochlorothiazide  25 mg Oral Daily  . levothyroxine  100 mcg Oral QAC breakfast  . morphine  60 mg Oral 3 times per day  . nadolol  40 mg Oral QHS  . pantoprazole  40 mg Oral Daily  . predniSONE  120 mg Oral Q breakfast   Continuous Infusions:   Principal Problem:   ITP (idiopathic thrombocytopenic purpura) Active Problems:   Thrombocytopenia    Time spent: 35 minutes.     Niel Hummer A  [facility] Pager [phone]. If 7PM-7AM, please contact night-coverage at [email or web address], password [facility] 05/26/2015, 9:15 AM  LOS: 1 day

## 2015-05-26 NOTE — Addendum Note (Signed)
Addended by: Jesusita Oka on: 05/26/2015 08:41 AM   Modules accepted: Orders

## 2015-05-27 ENCOUNTER — Telehealth: Payer: Self-pay | Admitting: Oncology

## 2015-05-27 DIAGNOSIS — E038 Other specified hypothyroidism: Secondary | ICD-10-CM

## 2015-05-27 DIAGNOSIS — E785 Hyperlipidemia, unspecified: Secondary | ICD-10-CM

## 2015-05-27 DIAGNOSIS — D696 Thrombocytopenia, unspecified: Secondary | ICD-10-CM

## 2015-05-27 LAB — CBC
HCT: 34.8 % — ABNORMAL LOW (ref 39.0–52.0)
HEMOGLOBIN: 11.8 g/dL — AB (ref 13.0–17.0)
MCH: 29.1 pg (ref 26.0–34.0)
MCHC: 33.9 g/dL (ref 30.0–36.0)
MCV: 85.9 fL (ref 78.0–100.0)
Platelets: 16 10*3/uL — CL (ref 150–400)
RBC: 4.05 MIL/uL — ABNORMAL LOW (ref 4.22–5.81)
RDW: 13 % (ref 11.5–15.5)
WBC: 10 10*3/uL (ref 4.0–10.5)

## 2015-05-27 LAB — RICKETTSIAL FEVER ABS
Q FEVER PHASE I: NEGATIVE
Q Fever Phase II: NEGATIVE
RMSF IgG: NEGATIVE

## 2015-05-27 MED ORDER — PREDNISONE 20 MG PO TABS: 120.0000 mg | ORAL_TABLET | Freq: Every day | ORAL | Status: DC

## 2015-05-27 NOTE — Progress Notes (Signed)
Labs reviewed in the last 24 hours. His platelet count this morning is up to 16 with his DIC panel the within normal range.  Given the reasonable response to prednisone, I have no objections to discharging him on the current prednisone dose and we'll arrange follow-up for him next week to recheck his platelet counts. My plan is to keep him on the current prednisone dose until his platelets are fully recovered. I will start to taper at that point.  If he develops any bleeding such as of his taxis, hematochezia or melanoma I do not recommend discharge however.  I do not see any evidence to suggest prednisone refractoriness and will likely not require IVIG, rituximab or splenectomy at this time.  My office will contact him with a follow-up appointment in the next 24 hours.

## 2015-05-27 NOTE — Telephone Encounter (Signed)
Hosp f/u appt-s/w patient wife [name] and gave appt for 08/04 @ 10 w/Dr. Alen Blew.

## 2015-05-27 NOTE — Discharge Summary (Signed)
Physician Discharge Summary  [name] QPY:[id number] DOB: Nov 13, 1950 DOA: 05/25/2015  PCP: Oscar Lange, MD  Admit date: 05/25/2015 Discharge date: 05/27/2015  Time spent: > 35 minutes  Recommendations for Outpatient Follow-up:  1. Follow up with Dr. Alen Burns as scheduled  Discharge Diagnoses:  Principal Problem:   ITP (idiopathic thrombocytopenic purpura) Active Problems:   CAD (coronary artery disease)   Mixed hyperlipidemia   Hyperlipidemia   Hypothyroidism   Thrombocytopenia  Discharge Condition: stable  Diet recommendation: heart healthy  Filed Weights   05/25/15 1834  Weight: 124.739 kg (275 lb)   History of present illness:  [name] is a 64 y.o. male who presents to the ED after he went to his PCP for a rash of several days duration. He also had a nose bleed and spontaneous gum bleeding. Labs were drawn at his PCPs office which demonstrated thrombocytopenia. He has a history of mild thrombocytopenia several years ago in the "90-100" platelet range that was apparently "due to his immune system attacking his platelets" per a "lab" that was drawn he says.  Hospital Course:  Patient was admitted to the hospital with thrombocytopenia, oncology was consulted and have followed patient was hospitalized. It was thought that this is due to ITP, and patient was started on prednisone 120 mg daily. His platelet counts have started to improve while on prednisone, and clinically had no episodes of bleeding, no epistaxis or any signs of a GI bleed. Given improvement in platelets  on prednisone, oncology was okay with patient being discharged home on current prednisone dose over 120 mg, with outpatient follow-up. Patient was clinically stable, no evidence of bleeding, he was discharged home in stable condition and has follow-up next week. His other medical problems have remained stable and no medication changes were done to his chronic regimen  Procedures:  None     Consultations:  Oncology   Discharge Exam: Filed Vitals:   05/26/15 0616 05/26/15 1400 05/26/15 2016 05/27/15 0501  BP: 142/69 105/53 117/69 133/61  Pulse: 65 70 66 51  Temp: 98.2 F (36.8 C) 98.1 F (36.7 C) 98.8 F (37.1 C) 97.6 F (36.4 C)  TempSrc: Oral  Oral   Resp:  16 15 16   Height:      Weight:      SpO2: 96% 98% 97% 97%    General: NAD Cardiovascular: RRR Respiratory: CTA biL  Discharge Instructions     Medication List    STOP taking these medications        doxycycline 100 MG capsule  Commonly known as:  VIBRAMYCIN      TAKE these medications        albuterol 108 (90 BASE) MCG/ACT inhaler  Commonly known as:  PROVENTIL HFA;VENTOLIN HFA  Inhale 2 puffs into the lungs every 6 (six) hours as needed for wheezing or shortness of breath.     atorvastatin 80 MG tablet  Commonly known as:  LIPITOR  Take 1 tablet (80 mg total) by mouth daily.     cetirizine 10 MG tablet  Commonly known as:  ZYRTEC  Take 10 mg by mouth at bedtime.     citalopram 20 MG tablet  Commonly known as:  CELEXA  Take 1 tablet (20 mg total) by mouth every morning.     doxazosin 4 MG tablet  Commonly known as:  CARDURA  Take 1 tablet (4 mg total) by mouth every morning.     hydrochlorothiazide 25 MG tablet  Commonly known as:  HYDRODIURIL  TAKE ONE TABLET BY MOUTH ONCE DAILY.     levothyroxine 100 MCG tablet  Commonly known as:  SYNTHROID, LEVOTHROID  Take 1 tablet (100 mcg total) by mouth daily before breakfast.     morphine 60 MG 12 hr tablet  Commonly known as:  MS CONTIN  Take 60 mg by mouth every 8 (eight) hours. Scheduled doses for pain     morphine 15 MG tablet  Commonly known as:  MSIR  Take 15 mg by mouth every 6 (six) hours as needed for severe pain.     multivitamin capsule  Take 1 capsule by mouth daily.     nadolol 40 MG tablet  Commonly known as:  CORGARD  Take 1 tablet (40 mg total) by mouth at bedtime.     nitroGLYCERIN 0.4 MG SL tablet   Commonly known as:  NITROSTAT  Place 0.4 mg under the tongue every 5 (five) minutes as needed for chest pain.     pantoprazole 40 MG tablet  Commonly known as:  PROTONIX  Take 1 tablet (40 mg total) by mouth daily.     predniSONE 20 MG tablet  Commonly known as:  DELTASONE  Take 6 tablets (120 mg total) by mouth daily with breakfast.           Follow-up Information    Follow up with Oscar Lange, MD. Schedule an appointment as soon as possible for a visit in 4 weeks.   Specialty:  Family Medicine   Contact information:   [address] [place] [address] [phone]       Follow up with [facility], MD In 1 week.   Specialty:  Oncology   Why:  Dr. Hazeline Junker office will call you wityh an appointment date and time   Contact information:   [address] [address] [phone]       The results of significant diagnostics from this hospitalization (including imaging, microbiology, ancillary and laboratory) are listed below for reference.    Significant Diagnostic Studies: Dg Chest 2 View  05/26/2015   CLINICAL DATA:  Dyspnea and weakness.  Thrombocytopenia.  EXAM: CHEST  2 VIEW  COMPARISON:  09/15/2013  FINDINGS: There is mild unchanged cardiomegaly. The lungs are clear. The pulmonary vasculature is normal. There is no pleural effusion. Hilar and mediastinal contours are unremarkable and unchanged.  IMPRESSION: Mild unchanged cardiomegaly.  No acute findings.   Electronically Signed   By: Andreas Newport M.D.   On: 05/26/2015 00:07    Microbiology: No results found for this or any previous visit (from the past 240 hour(s)).   Labs: Basic Metabolic Panel:  Recent Labs Lab 05/25/15 1148 05/25/15 1841  NA 139 138  K 3.9 3.6  CL 96* 103  CO2 24 28  GLUCOSE 102* 108*  BUN 10 10  CREATININE 0.81 0.89  CALCIUM 8.8 8.8*   Liver Function Tests:  Recent Labs Lab 05/25/15 1148 05/25/15 1841  AST 20 22  ALT 20 21  ALKPHOS 116 102   BILITOT 0.9 1.0  PROT 6.6 6.5  ALBUMIN  --  4.0   CBC:  Recent Labs Lab 05/25/15 1148  05/25/15 1841 05/26/15 0529 05/26/15 1010 05/26/15 1233 05/26/15 2110 05/27/15 0830  WBC 5.1  --  5.0 5.7 4.1  --  10.6* 10.0  NEUTROABS 3.9  --  3.6  --   --   --   --   --   HGB  --   --  13.1 12.5* 12.3*  --  12.9* 11.8*  HCT 39.2  --  39.1 37.5* 36.8*  --  38.6* 34.8*  MCV  --   --  86.7 86.8 86.6  --  86.9 85.9  PLT  --   < > <5* 7* 7* 6* 12* 16*  < > = values in this interval not displayed.   SignedMarzetta Board  [facility] 05/27/2015, 3:44 PM

## 2015-05-27 NOTE — Care Management Important Message (Signed)
Important Message  Patient Details  Name: [name] MRN: [id number] Date of Birth: [birth year]   Medicare Important Message Given:  Yes-second notification given    Delorse Lek 05/27/2015, 12:32 PM

## 2015-05-27 NOTE — Care Management Utilization Note (Signed)
Utilization review completed. Alek Poncedeleon, RN Case Manager [phone]. 

## 2015-06-03 ENCOUNTER — Other Ambulatory Visit: Payer: Self-pay | Admitting: Oncology

## 2015-06-03 DIAGNOSIS — D696 Thrombocytopenia, unspecified: Secondary | ICD-10-CM

## 2015-06-04 ENCOUNTER — Other Ambulatory Visit (HOSPITAL_BASED_OUTPATIENT_CLINIC_OR_DEPARTMENT_OTHER): Payer: Medicare Other

## 2015-06-04 ENCOUNTER — Ambulatory Visit: Payer: Medicare Other

## 2015-06-04 ENCOUNTER — Telehealth: Payer: Self-pay | Admitting: Oncology

## 2015-06-04 ENCOUNTER — Ambulatory Visit (HOSPITAL_BASED_OUTPATIENT_CLINIC_OR_DEPARTMENT_OTHER): Payer: Medicare Other | Admitting: Oncology

## 2015-06-04 VITALS — BP 172/78 | HR 94 | Temp 98.0°F | Resp 19 | Ht 71.0 in | Wt 279.8 lb

## 2015-06-04 DIAGNOSIS — D696 Thrombocytopenia, unspecified: Secondary | ICD-10-CM | POA: Diagnosis not present

## 2015-06-04 LAB — COMPREHENSIVE METABOLIC PANEL (CC13)
ALBUMIN: 3.8 g/dL (ref 3.5–5.0)
ALT: 20 U/L (ref 0–55)
ANION GAP: 8 meq/L (ref 3–11)
AST: 12 U/L (ref 5–34)
Alkaline Phosphatase: 109 U/L (ref 40–150)
BILIRUBIN TOTAL: 0.41 mg/dL (ref 0.20–1.20)
BUN: 9.8 mg/dL (ref 7.0–26.0)
CALCIUM: 8.5 mg/dL (ref 8.4–10.4)
CO2: 31 mEq/L — ABNORMAL HIGH (ref 22–29)
Chloride: 100 mEq/L (ref 98–109)
Creatinine: 0.8 mg/dL (ref 0.7–1.3)
Glucose: 139 mg/dl (ref 70–140)
POTASSIUM: 3.4 meq/L — AB (ref 3.5–5.1)
SODIUM: 139 meq/L (ref 136–145)
TOTAL PROTEIN: 6.3 g/dL — AB (ref 6.4–8.3)

## 2015-06-04 LAB — CBC WITH DIFFERENTIAL/PLATELET
BASO%: 0.3 % (ref 0.0–2.0)
BASOS ABS: 0 10*3/uL (ref 0.0–0.1)
EOS%: 0.3 % (ref 0.0–7.0)
Eosinophils Absolute: 0 10*3/uL (ref 0.0–0.5)
HEMATOCRIT: 39.2 % (ref 38.4–49.9)
HGB: 12.8 g/dL — ABNORMAL LOW (ref 13.0–17.1)
LYMPH%: 5.3 % — ABNORMAL LOW (ref 14.0–49.0)
MCH: 28.2 pg (ref 27.2–33.4)
MCHC: 32.6 g/dL (ref 32.0–36.0)
MCV: 86.5 fL (ref 79.3–98.0)
MONO#: 0.4 10*3/uL (ref 0.1–0.9)
MONO%: 3.4 % (ref 0.0–14.0)
NEUT%: 90.7 % — ABNORMAL HIGH (ref 39.0–75.0)
NEUTROS ABS: 11.5 10*3/uL — AB (ref 1.5–6.5)
Platelets: 112 10*3/uL — ABNORMAL LOW (ref 140–400)
RBC: 4.54 10*6/uL (ref 4.20–5.82)
RDW: 13.8 % (ref 11.0–14.6)
WBC: 12.7 10*3/uL — AB (ref 4.0–10.3)
lymph#: 0.7 10*3/uL — ABNORMAL LOW (ref 0.9–3.3)

## 2015-06-04 LAB — CHCC SMEAR

## 2015-06-04 MED ORDER — PREDNISONE 20 MG PO TABS: 120.0000 mg | ORAL_TABLET | Freq: Every day | ORAL | Status: DC

## 2015-06-04 NOTE — Telephone Encounter (Signed)
Pt confirmed labs/ov per 08/04 POF, gave pt avs and calendar.... KJ °

## 2015-06-04 NOTE — Progress Notes (Signed)
Hematology and Oncology Follow Up Visit  [name] [id number] Nov 13, 1950 64 y.o. 06/04/2015 10:41 AM [name], MDLuking, Elayne Snare, MD   Principle Diagnosis: 64 year old gentleman with thrombocytopenia likely ITP diagnosed in July 2016. He presented with platelets less than 5 petechiae and ecchymosis.  Current therapy: Prednisone 120 mg daily with a near complete response in August 2016.  Interim History: [name] presents today for a follow-up visit. He is pleasant gentleman I saw in consultation on 05/26/2015 after presenting with undetectable platelet count. He had normal white cell count and normal hemoglobin. He was started on prednisone during his hospitalization and had an excellent response to platelet count to 16,000. He did receive platelet transfusion with a post count showed no response to transfusion. Since his discharge, he is doing well. He does not report any hematochezia, epistaxis, petechiae, ecchymosis or any signs of bleeding. He does not report any headaches or alteration of mental status. He does report insomnia, hyperactivity and excessive bleeding.  He does not report any neurological deficits, syncope or seizures. He does not report any fevers, chills, sweats or weight loss. He does not report any chest pain, orthopnea or leg edema. He does not report any cough, hemoptysis or hematemesis. Does not report any nausea, vomiting or abdominal pain. Does not report any frequency urgency or hesitancy. Does not report any skeletal complaints. Remaining review of systems unremarkable.  Medications: I have reviewed the patient's current medications.  Current Outpatient Prescriptions  Medication Sig Dispense Refill  . albuterol (PROVENTIL HFA;VENTOLIN HFA) 108 (90 BASE) MCG/ACT inhaler Inhale 2 puffs into the lungs every 6 (six) hours as needed for wheezing or shortness of breath. 1 Inhaler 3  . ALPRAZolam (XANAX) 0.5 MG tablet Take 0.5 mg by mouth at bedtime.    Marland Kitchen atorvastatin  (LIPITOR) 80 MG tablet Take 1 tablet (80 mg total) by mouth daily. 90 tablet 2  . cetirizine (ZYRTEC) 10 MG tablet Take 10 mg by mouth at bedtime.      . citalopram (CELEXA) 20 MG tablet Take 1 tablet (20 mg total) by mouth every morning. 90 tablet 2  . doxazosin (CARDURA) 4 MG tablet Take 1 tablet (4 mg total) by mouth every morning. 90 tablet 2  . hydrochlorothiazide (HYDRODIURIL) 25 MG tablet TAKE ONE TABLET BY MOUTH ONCE DAILY. 90 tablet 1  . levothyroxine (SYNTHROID, LEVOTHROID) 100 MCG tablet Take 1 tablet (100 mcg total) by mouth daily before breakfast. 90 tablet 2  . morphine (MS CONTIN) 60 MG 12 hr tablet Take 60 mg by mouth every 8 (eight) hours. Scheduled doses for pain    . morphine (MSIR) 15 MG tablet Take 15 mg by mouth every 6 (six) hours as needed for severe pain.    . Multiple Vitamin (MULTIVITAMIN) capsule Take 1 capsule by mouth daily.      . nadolol (CORGARD) 40 MG tablet Take 1 tablet (40 mg total) by mouth at bedtime. 90 tablet 2  . nitroGLYCERIN (NITROSTAT) 0.4 MG SL tablet Place 0.4 mg under the tongue every 5 (five) minutes as needed for chest pain.    . pantoprazole (PROTONIX) 40 MG tablet Take 1 tablet (40 mg total) by mouth daily. 90 tablet 2  . predniSONE (DELTASONE) 20 MG tablet Take 6 tablets (120 mg total) by mouth daily with breakfast. 180 tablet 0   No current facility-administered medications for this visit.     Allergies:  Allergies  Allergen Reactions  . Penicillins Other (See Comments)    "  I passed out last time I had it and they told me not to take it again."  . Sulfa Antibiotics Swelling  . Zoloft [Sertraline Hcl] Other (See Comments)    Confusion     Past Medical History, Surgical history, Social history, and Family History were reviewed and updated.   Physical Exam: Blood pressure 172/78, pulse 94, temperature 98 F (36.7 C), temperature source Oral, resp. rate 19, height 5\' 11"  (1.803 m), weight 279 lb 12.8 oz (126.916 kg), SpO2 98  %. ECOG: 0 General appearance: alert and cooperative Head: Normocephalic, without obvious abnormality Neck: no adenopathy Lymph nodes: Cervical, supraclavicular, and axillary nodes normal. Heart:regular rate and rhythm, S1, S2 normal, no murmur, click, rub or gallop Lung:chest clear, no wheezing, rales, normal symmetric air entry Abdomin: soft, non-tender, without masses or organomegaly EXT:no erythema, induration, or nodules   Lab Results: Lab Results  Component Value Date   WBC 12.7* 06/04/2015   HGB 12.8* 06/04/2015   HCT 39.2 06/04/2015   MCV 86.5 06/04/2015   PLT 112 Platelet count confirmed by slide estimate* 06/04/2015     Chemistry      Component Value Date/Time   NA 138 05/25/2015 1841   NA 139 05/25/2015 1148   K 3.6 05/25/2015 1841   CL 103 05/25/2015 1841   CO2 28 05/25/2015 1841   BUN 10 05/25/2015 1841   BUN 10 05/25/2015 1148   CREATININE 0.89 05/25/2015 1841   CREATININE 0.88 01/14/2014 0947      Component Value Date/Time   CALCIUM 8.8* 05/25/2015 1841   ALKPHOS 102 05/25/2015 1841   AST 22 05/25/2015 1841   ALT 21 05/25/2015 1841   BILITOT 1.0 05/25/2015 1841   BILITOT 0.9 05/25/2015 1148      Impression and Plan:   64 year old gentleman with the following issues:  1. Thrombocytopenia likely represent ITP diagnosed in July 2016. He has long lasting thrombocytopenia dating back for many years with a baseline of about 112. He clearly presented with acute exacerbation. After 2 weeks of prednisone therapy, he has near complete response with platelet count of 112. The plan is to start prednisone taper starting on 06/05/2015 by 20 mg every week. I will check his platelet counts in 2 weeks and in 4 weeks. If he develops a relapse, we will restart his prednisone back at 120 mg.  Options of refractory ITP would include IVIG, rituximab and splenectomy.  Follow-up: Will be in 2 weeks for laboratory check and if his blood counts continue to be above 100, we'll  continue with the prednisone taper by 20 mg every week.  [facility], MD 8/4/201610:41 AM

## 2015-06-19 ENCOUNTER — Other Ambulatory Visit (HOSPITAL_BASED_OUTPATIENT_CLINIC_OR_DEPARTMENT_OTHER): Payer: Medicare Other

## 2015-06-19 DIAGNOSIS — D696 Thrombocytopenia, unspecified: Secondary | ICD-10-CM | POA: Diagnosis not present

## 2015-06-19 LAB — CBC WITH DIFFERENTIAL/PLATELET
BASO%: 0.3 % (ref 0.0–2.0)
Basophils Absolute: 0 10*3/uL (ref 0.0–0.1)
EOS ABS: 0 10*3/uL (ref 0.0–0.5)
EOS%: 0.1 % (ref 0.0–7.0)
HEMATOCRIT: 40.3 % (ref 38.4–49.9)
HGB: 13 g/dL (ref 13.0–17.1)
LYMPH%: 3.9 % — ABNORMAL LOW (ref 14.0–49.0)
MCH: 28.2 pg (ref 27.2–33.4)
MCHC: 32.3 g/dL (ref 32.0–36.0)
MCV: 87.4 fL (ref 79.3–98.0)
MONO#: 0.3 10*3/uL (ref 0.1–0.9)
MONO%: 2.1 % (ref 0.0–14.0)
NEUT#: 11.3 10*3/uL — ABNORMAL HIGH (ref 1.5–6.5)
NEUT%: 93.6 % — AB (ref 39.0–75.0)
PLATELETS: 99 10*3/uL — AB (ref 140–400)
RBC: 4.61 10*6/uL (ref 4.20–5.82)
RDW: 14.8 % — ABNORMAL HIGH (ref 11.0–14.6)
WBC: 12.1 10*3/uL — ABNORMAL HIGH (ref 4.0–10.3)
lymph#: 0.5 10*3/uL — ABNORMAL LOW (ref 0.9–3.3)

## 2015-06-23 ENCOUNTER — Ambulatory Visit (INDEPENDENT_AMBULATORY_CARE_PROVIDER_SITE_OTHER): Payer: Medicare Other | Admitting: Family Medicine

## 2015-06-23 ENCOUNTER — Encounter: Payer: Self-pay | Admitting: Family Medicine

## 2015-06-23 VITALS — BP 160/98 | Temp 98.3°F | Ht 71.0 in | Wt 283.5 lb

## 2015-06-23 DIAGNOSIS — R5383 Other fatigue: Secondary | ICD-10-CM

## 2015-06-23 DIAGNOSIS — R1314 Dysphagia, pharyngoesophageal phase: Secondary | ICD-10-CM

## 2015-06-23 DIAGNOSIS — I10 Essential (primary) hypertension: Secondary | ICD-10-CM | POA: Diagnosis not present

## 2015-06-23 DIAGNOSIS — R600 Localized edema: Secondary | ICD-10-CM

## 2015-06-23 MED ORDER — METOPROLOL TARTRATE 50 MG PO TABS: 50.0000 mg | ORAL_TABLET | Freq: Two times a day (BID) | ORAL | Status: DC

## 2015-06-23 NOTE — Progress Notes (Signed)
   Subjective:    Patient ID: [name], male    DOB: [birth year], 64 y.o.   MRN: [id number]  HPI Patient is here today because he is having swelling in his left foot/ankle. This started about 4 days ago. Patient states that he is having trouble swallowing and his throat feels raw. Patient believes his symptoms may be related to his high prednisone dosing.  Patient has no other concerns at this time.    Patient doing recently in the hospital for petechiae. Has autoimmune thrombocytopenia Recently over the past several days and noticed swelling in the left lower leg. Relates some soreness and discomfort no shortness of breath Patient relates unable to afford his current beta blocker. Has been doing without it. Wonders if there is a cheaper alternative Patient also relates at times food gets stuck when he swallows he did have his esophagus stretched in the past. Review of Systems    patient denies any chest pressure tightness or pain he does relate fatigue tiredness he does state he has a hard time swallowing he feels like things get stuck part way down he has had problems with dysphagia in the past. He has had times where his esophagus had to be stretched. Objective:   Physical Exam  Significant swelling in the left leg. Some tenderness around the frontal part of the left lower leg but none in the calf the knee is normal. He does have some swelling in the right leg his lungs are clear heart is regular pulses normal blood pressure elevated      Assessment & Plan:  #1 significant swelling left leg. Even though he does not have tenderness in the calf he states he's having soreness in the lower leg. This raises into question the possibility of a DVT. He states this swelling is come on over the past 4 to 5 days. He has recently been in the hospital. We will go ahead and check d-dimer as well as  #2 hypertension-probably related to the prednisone. I would recommend reinitiating beta blockers. The  patient was taking Cogard but he cannot afford this currently. Therefore we will use metoprolol 50 mg twice a day patient is to follow-up within 2 weeks to recheck blood pressure.  #3 patient relates some fatigue and tiredness he wonders if he could have thyroid dysfunction we will check this  #4 patient on diuretic has not had metabolic 7 recently we will check this.  #5 dysphagia-referral back to gastroenterology may need to his esophagus stretched again. Would recommend that his platelet count be watched closely around the time of this procedure  #6 thrombocytopenia-patient relates that his specialists has stated they're trying prednisone first if that doesn't help they will consider using splenectomy  25 minutes spent with patient discussing these issues

## 2015-06-24 ENCOUNTER — Other Ambulatory Visit: Payer: Self-pay | Admitting: *Deleted

## 2015-06-24 ENCOUNTER — Ambulatory Visit (HOSPITAL_COMMUNITY)
Admission: RE | Admit: 2015-06-24 | Discharge: 2015-06-24 | Disposition: A | Discharge status: Home / Self Care | Payer: Medicare Other | Source: Ambulatory Visit | Attending: Family Medicine | Admitting: Family Medicine

## 2015-06-24 DIAGNOSIS — R600 Localized edema: Secondary | ICD-10-CM | POA: Diagnosis not present

## 2015-06-24 DIAGNOSIS — R5383 Other fatigue: Secondary | ICD-10-CM | POA: Diagnosis not present

## 2015-06-24 MED ORDER — POTASSIUM CHLORIDE CRYS ER 10 MEQ PO TBCR: 10.0000 meq | EXTENDED_RELEASE_TABLET | Freq: Two times a day (BID) | ORAL | Status: DC

## 2015-06-24 MED ORDER — FUROSEMIDE 20 MG PO TABS: 20.0000 mg | ORAL_TABLET | ORAL | Status: DC

## 2015-06-25 ENCOUNTER — Other Ambulatory Visit: Payer: Self-pay

## 2015-06-25 DIAGNOSIS — E039 Hypothyroidism, unspecified: Secondary | ICD-10-CM

## 2015-06-25 DIAGNOSIS — R739 Hyperglycemia, unspecified: Secondary | ICD-10-CM

## 2015-06-25 LAB — D-DIMER, QUANTITATIVE (NOT AT ARMC): D-DIMER: 0.38 mg/L FEU (ref 0.00–0.49)

## 2015-06-25 LAB — BASIC METABOLIC PANEL
BUN / CREAT RATIO: 16 (ref 10–22)
BUN: 12 mg/dL (ref 8–27)
CO2: 28 mmol/L (ref 18–29)
Calcium: 8.8 mg/dL (ref 8.6–10.2)
Chloride: 96 mmol/L — ABNORMAL LOW (ref 97–108)
Creatinine, Ser: 0.76 mg/dL (ref 0.76–1.27)
GFR calc Af Amer: 111 mL/min/{1.73_m2} (ref >59)
GFR calc non Af Amer: 96 mL/min/{1.73_m2} (ref >59)
GLUCOSE: 139 mg/dL — AB (ref 65–99)
POTASSIUM: 3.8 mmol/L (ref 3.5–5.2)
SODIUM: 139 mmol/L (ref 134–144)

## 2015-06-25 LAB — TSH: TSH: 6.22 u[IU]/mL — AB (ref 0.450–4.500)

## 2015-06-25 MED ORDER — LEVOTHYROXINE SODIUM 112 MCG PO TABS: 112.0000 ug | ORAL_TABLET | Freq: Every day | ORAL | Status: DC

## 2015-07-01 ENCOUNTER — Other Ambulatory Visit: Payer: Self-pay | Admitting: Family Medicine

## 2015-07-02 ENCOUNTER — Encounter (INDEPENDENT_AMBULATORY_CARE_PROVIDER_SITE_OTHER): Payer: Self-pay | Admitting: *Deleted

## 2015-07-03 ENCOUNTER — Ambulatory Visit (HOSPITAL_BASED_OUTPATIENT_CLINIC_OR_DEPARTMENT_OTHER): Payer: Medicare Other | Admitting: Oncology

## 2015-07-03 ENCOUNTER — Telehealth: Payer: Self-pay | Admitting: Oncology

## 2015-07-03 ENCOUNTER — Other Ambulatory Visit (HOSPITAL_BASED_OUTPATIENT_CLINIC_OR_DEPARTMENT_OTHER): Payer: Medicare Other

## 2015-07-03 VITALS — BP 140/70 | HR 73 | Temp 97.5°F | Resp 20 | Ht 71.0 in | Wt 284.1 lb

## 2015-07-03 DIAGNOSIS — D693 Immune thrombocytopenic purpura: Secondary | ICD-10-CM

## 2015-07-03 DIAGNOSIS — D696 Thrombocytopenia, unspecified: Secondary | ICD-10-CM

## 2015-07-03 LAB — CBC WITH DIFFERENTIAL/PLATELET
BASO%: 0.2 % (ref 0.0–2.0)
Basophils Absolute: 0 10*3/uL (ref 0.0–0.1)
EOS%: 0.1 % (ref 0.0–7.0)
Eosinophils Absolute: 0 10*3/uL (ref 0.0–0.5)
HCT: 38.8 % (ref 38.4–49.9)
HGB: 12.4 g/dL — ABNORMAL LOW (ref 13.0–17.1)
LYMPH%: 6.1 % — ABNORMAL LOW (ref 14.0–49.0)
MCH: 29 pg (ref 27.2–33.4)
MCHC: 32 g/dL (ref 32.0–36.0)
MCV: 90.7 fL (ref 79.3–98.0)
MONO#: 0.2 10*3/uL (ref 0.1–0.9)
MONO%: 2.1 % (ref 0.0–14.0)
NEUT#: 9.8 10*3/uL — ABNORMAL HIGH (ref 1.5–6.5)
NEUT%: 91.5 % — AB (ref 39.0–75.0)
Platelets: 149 10*3/uL (ref 140–400)
RBC: 4.28 10*6/uL (ref 4.20–5.82)
RDW: 14.5 % (ref 11.0–14.6)
WBC: 10.7 10*3/uL — ABNORMAL HIGH (ref 4.0–10.3)
lymph#: 0.7 10*3/uL — ABNORMAL LOW (ref 0.9–3.3)

## 2015-07-03 NOTE — Progress Notes (Signed)
Hematology and Oncology Follow Up Visit  [name] [id number] 18-Nov-1950 64 y.o. 07/03/2015 12:02 PM [name], MDLuking, Elayne Snare, MD   Principle Diagnosis: 64 year old gentleman with thrombocytopenia likely ITP diagnosed in July 2016. He presented with platelets less than 5 petechiae and ecchymosis.  Current therapy: Prednisone 120 mg daily with complete response in August 2016. He is currently on a prednisone taper with the current dose of 20 mg daily for the next 5 days.  Interim History: [name] presents today for a follow-up visit. Since his last visit, he is doing well. He does not report any hematochezia, epistaxis, petechiae, ecchymosis or any signs of bleeding. He does not report any headaches or alteration of mental status. He does report insomnia, hyperactivity and excessive bleeding. He did develop lower extremity edema and lower extremity Dopplers ruled out deep vein thrombosis. He is feeling better since he is been on lower doses of prednisone.  He does not report any neurological deficits, syncope or seizures. He does not report any fevers, chills, sweats or weight loss. He does not report any chest pain, orthopnea or leg edema. He does not report any cough, hemoptysis or hematemesis. Does not report any nausea, vomiting or abdominal pain. Does not report any frequency urgency or hesitancy. Does not report any skeletal complaints. Remaining review of systems unremarkable.  Medications: I have reviewed the patient's current medications.  Current Outpatient Prescriptions  Medication Sig Dispense Refill  . albuterol (PROVENTIL HFA;VENTOLIN HFA) 108 (90 BASE) MCG/ACT inhaler Inhale 2 puffs into the lungs every 6 (six) hours as needed for wheezing or shortness of breath. 1 Inhaler 3  . ALPRAZolam (XANAX) 0.5 MG tablet Take 0.5 mg by mouth at bedtime.    Marland Kitchen atorvastatin (LIPITOR) 80 MG tablet TAKE ONE TABLET BY MOUTH ONCE DAILY. 90 tablet 3  . cetirizine (ZYRTEC) 10 MG tablet Take  10 mg by mouth at bedtime.      . citalopram (CELEXA) 20 MG tablet TAKE 1 TABLET BY MOUTH EVERY MORNING. 90 tablet 3  . doxazosin (CARDURA) 4 MG tablet TAKE 1 TABLET BY MOUTH EVERY MORNING. 90 tablet 3  . furosemide (LASIX) 20 MG tablet Take 1 tablet (20 mg total) by mouth every morning. 30 tablet 4  . levothyroxine (SYNTHROID) 112 MCG tablet Take 1 tablet (112 mcg total) by mouth daily before breakfast. 30 tablet 5  . metoprolol tartrate (LOPRESSOR) 50 MG tablet Take 1 tablet (50 mg total) by mouth 2 (two) times daily. 60 tablet 4  . morphine (MS CONTIN) 60 MG 12 hr tablet Take 60 mg by mouth every 8 (eight) hours. Scheduled doses for pain    . morphine (MSIR) 15 MG tablet Take 15 mg by mouth every 6 (six) hours as needed for severe pain.    . Multiple Vitamin (MULTIVITAMIN) capsule Take 1 capsule by mouth daily.      . nitroGLYCERIN (NITROSTAT) 0.4 MG SL tablet Place 0.4 mg under the tongue every 5 (five) minutes as needed for chest pain.    . pantoprazole (PROTONIX) 40 MG tablet TAKE 1 TABLET BY MOUTH ONCE A DAY. 90 tablet 3  . potassium chloride (K-DUR,KLOR-CON) 10 MEQ tablet Take 1 tablet (10 mEq total) by mouth 2 (two) times daily. 60 tablet 4  . predniSONE (DELTASONE) 20 MG tablet Take 6 tablets (120 mg total) by mouth daily with breakfast. (Patient taking differently: Take 60 mg by mouth daily with breakfast. ) 180 tablet 0   No current facility-administered medications for  this visit.     Allergies:  Allergies  Allergen Reactions  . Penicillins Other (See Comments)    "I passed out last time I had it and they told me not to take it again."  . Sulfa Antibiotics Swelling  . Zoloft [Sertraline Hcl] Other (See Comments)    Confusion     Past Medical History, Surgical history, Social history, and Family History were reviewed and updated.   Physical Exam: Blood pressure 140/70, pulse 73, temperature 97.5 F (36.4 C), temperature source Oral, resp. rate 20, height 5\' 11"  (1.803  m), weight 284 lb 1.6 oz (128.867 kg), SpO2 100 %. ECOG: 0 General appearance: alert and cooperative well-appearing gentleman without distress. Head: Normocephalic, without obvious abnormality Neck: no adenopathy Lymph nodes: Cervical, supraclavicular, and axillary nodes normal. Heart:regular rate and rhythm, S1, S2 normal, no murmur, click, rub or gallop Lung:chest clear, no wheezing, rales, normal symmetric air entry Abdomin: soft, non-tender, without masses or organomegaly obese without any shifting dullness. EXT:no erythema, induration, or nodules trace edema noted.   Lab Results: Lab Results  Component Value Date   WBC 10.7* 07/03/2015   HGB 12.4* 07/03/2015   HCT 38.8 07/03/2015   MCV 90.7 07/03/2015   PLT 149 07/03/2015     Chemistry      Component Value Date/Time   NA 139 06/24/2015 0811   NA 139 06/04/2015 1005   NA 138 05/25/2015 1841   K 3.8 06/24/2015 0811   K 3.4* 06/04/2015 1005   CL 96* 06/24/2015 0811   CO2 28 06/24/2015 0811   CO2 31* 06/04/2015 1005   BUN 12 06/24/2015 0811   BUN 9.8 06/04/2015 1005   BUN 10 05/25/2015 1841   CREATININE 0.76 06/24/2015 0811   CREATININE 0.8 06/04/2015 1005   CREATININE 0.88 01/14/2014 0947      Component Value Date/Time   CALCIUM 8.8 06/24/2015 0811   CALCIUM 8.5 06/04/2015 1005   ALKPHOS 109 06/04/2015 1005   ALKPHOS 102 05/25/2015 1841   AST 12 06/04/2015 1005   AST 22 05/25/2015 1841   ALT 20 06/04/2015 1005   ALT 21 05/25/2015 1841   BILITOT 0.41 06/04/2015 1005   BILITOT 1.0 05/25/2015 1841   BILITOT 0.9 05/25/2015 1148      Impression and Plan:   64 year old gentleman with the following issues:  1. Thrombocytopenia likely represent ITP diagnosed in July 2016. He presented with a platelet count of 16,000 and had a complete response to prednisone with a normal platelet count at this time. His prednisone starting this was 120 mg daily and have been tapered down to 20 mg at this time. The plan is to  continue on this current dose for next week and then discontinue after that. If he develops a relapse disease, retreatment with stores would be possible.  He develops refractory disease then options of refractory ITP would include IVIG, rituximab and splenectomy.  2. Follow-up: Will be in 3 months to recheck his platelet counts at that time.  [facility], MD 9/2/201612:02 PM

## 2015-07-03 NOTE — Telephone Encounter (Signed)
Gave and printed appts ched and avs for pt for DEC  °

## 2015-07-08 ENCOUNTER — Ambulatory Visit (INDEPENDENT_AMBULATORY_CARE_PROVIDER_SITE_OTHER): Payer: Medicare Other | Admitting: Family Medicine

## 2015-07-08 ENCOUNTER — Encounter: Payer: Self-pay | Admitting: Family Medicine

## 2015-07-08 VITALS — BP 126/72 | Ht 71.0 in | Wt 284.2 lb

## 2015-07-08 DIAGNOSIS — I951 Orthostatic hypotension: Secondary | ICD-10-CM

## 2015-07-08 DIAGNOSIS — B370 Candidal stomatitis: Secondary | ICD-10-CM

## 2015-07-08 MED ORDER — METOPROLOL TARTRATE 25 MG PO TABS: 50.0000 mg | ORAL_TABLET | Freq: Two times a day (BID) | ORAL | Status: DC

## 2015-07-08 MED ORDER — NYSTATIN 100000 UNIT/ML MT SUSP: 5.0000 mL | Freq: Four times a day (QID) | OROMUCOSAL | Status: AC

## 2015-07-08 MED ORDER — FLUCONAZOLE 200 MG PO TABS: 200.0000 mg | ORAL_TABLET | Freq: Every day | ORAL | Status: DC

## 2015-07-08 NOTE — Patient Instructions (Addendum)
For the next 7 days  Fluconazole 200 mg one daily for 7 days  Nystatin oral solution 1 tsp 4 times a day swish and swallow for 7 days  Reduce the metoprolol from 50 mg to 25 mg twice a day  (so your current med metoprolol 50mg  split it in half ) ( I sent a new script in for 25 mg twice a day)

## 2015-07-08 NOTE — Progress Notes (Signed)
   Subjective:    Patient ID: [name], male    DOB: [birth year], 64 y.o.   MRN: [id number]  HPI Patient arrives for a follow up on dizziness. Patient states he just stopped steroids and is having sx from that currently. Patient has been on steroid because of thrombocytopenia. Recently tapering off of these. Has had several day history of burning in the throat and down into the upper chest when he swallows patient also relates at times gets a little bit dizzy he stands up  Review of Systems Patient denies any chest tightness pressure pain shortness breath nausea vomiting he does relate sore throat burning in his throat and upper esophagus when he swallows    Objective:   Physical Exam Lungs are clear hearts regular pulse normal throat slight erythema in the back of the throat no white patches seen       Assessment & Plan:  Probable thrush based on history and based on symptoms recommend treating with nystatin oral solution plus also Diflucan hold off on statins while on Diflucan  Follow-up with [place] if not better over the next 7-10 days  Orthostatic hypotension reduce metoprolol to 25 mg twice a day patient could not afford [place]

## 2015-07-20 ENCOUNTER — Ambulatory Visit (INDEPENDENT_AMBULATORY_CARE_PROVIDER_SITE_OTHER): Payer: Medicare Other | Admitting: Internal Medicine

## 2015-08-13 ENCOUNTER — Encounter: Payer: Self-pay | Admitting: Family Medicine

## 2015-08-13 ENCOUNTER — Ambulatory Visit (INDEPENDENT_AMBULATORY_CARE_PROVIDER_SITE_OTHER): Payer: Medicare Other | Admitting: Family Medicine

## 2015-08-13 VITALS — BP 144/80 | Temp 98.9°F | Ht 71.0 in | Wt 276.0 lb

## 2015-08-13 DIAGNOSIS — Z23 Encounter for immunization: Secondary | ICD-10-CM | POA: Diagnosis not present

## 2015-08-13 DIAGNOSIS — M8580 Other specified disorders of bone density and structure, unspecified site: Secondary | ICD-10-CM | POA: Diagnosis not present

## 2015-08-13 MED ORDER — DOXYCYCLINE HYCLATE 100 MG PO CAPS: 100.0000 mg | ORAL_CAPSULE | Freq: Two times a day (BID) | ORAL | Status: DC

## 2015-08-13 NOTE — Progress Notes (Signed)
   Subjective:    Patient ID: [name], male    DOB: 01/28/1951, 64 y.o.   MRN: [id number]  Cough This is a new problem. Episode onset: 3 days ago. Associated symptoms include headaches, nasal congestion, rhinorrhea and wheezing. Pertinent negatives include no chest pain, ear pain or fever.   This patient had several days of head congestion drainage coughing at times he thinks he's wheezing no shortness of breath with it he does relate sinus pressure some chest congestion as well   Review of Systems  Constitutional: Negative for fever and activity change.  HENT: Positive for congestion and rhinorrhea. Negative for ear pain.   Eyes: Negative for discharge.  Respiratory: Positive for cough and wheezing.   Cardiovascular: Negative for chest pain.  Neurological: Positive for headaches.       Objective:   Physical Exam  Constitutional: He appears well-developed.  HENT:  Head: Normocephalic.  Mouth/Throat: Oropharynx is clear and moist. No oropharyngeal exudate.  Neck: Normal range of motion.  Cardiovascular: Normal rate, regular rhythm and normal heart sounds.   No murmur heard. Pulmonary/Chest: Effort normal and breath sounds normal. He has no wheezes.  Lymphadenopathy:    He has no cervical adenopathy.  Neurological: He exhibits normal muscle tone.  Skin: Skin is warm and dry.  Nursing note and vitals reviewed.         Assessment & Plan:  Viral URI Secondary rhinosinusitis Antibiotics [name] Warning signs discussed Patient instructed to follow-up if worse In addition to this probable osteopenia. Patient states he was seen by specialist told that his bones were soft this was affecting the treatment. He's been on a lot of prednisone. I recommend bone density

## 2015-08-17 ENCOUNTER — Inpatient Hospital Stay (HOSPITAL_COMMUNITY): Admission: RE | Admit: 2015-08-17 | Payer: Medicare Other | Source: Ambulatory Visit

## 2015-09-21 DIAGNOSIS — G894 Chronic pain syndrome: Secondary | ICD-10-CM | POA: Diagnosis not present

## 2015-09-21 DIAGNOSIS — Z79891 Long term (current) use of opiate analgesic: Secondary | ICD-10-CM | POA: Diagnosis not present

## 2015-09-21 DIAGNOSIS — M961 Postlaminectomy syndrome, not elsewhere classified: Secondary | ICD-10-CM | POA: Diagnosis not present

## 2015-09-21 DIAGNOSIS — M4726 Other spondylosis with radiculopathy, lumbar region: Secondary | ICD-10-CM | POA: Diagnosis not present

## 2015-10-02 ENCOUNTER — Ambulatory Visit (HOSPITAL_BASED_OUTPATIENT_CLINIC_OR_DEPARTMENT_OTHER): Payer: Medicare Other | Admitting: Oncology

## 2015-10-02 ENCOUNTER — Telehealth: Payer: Self-pay | Admitting: Oncology

## 2015-10-02 ENCOUNTER — Other Ambulatory Visit (HOSPITAL_BASED_OUTPATIENT_CLINIC_OR_DEPARTMENT_OTHER): Payer: Medicare Other

## 2015-10-02 VITALS — BP 157/79 | HR 64 | Temp 97.8°F | Resp 18 | Ht 71.0 in | Wt 282.0 lb

## 2015-10-02 DIAGNOSIS — D693 Immune thrombocytopenic purpura: Secondary | ICD-10-CM

## 2015-10-02 LAB — COMPREHENSIVE METABOLIC PANEL
ALT: 10 U/L (ref 0–55)
AST: 12 U/L (ref 5–34)
Albumin: 3.8 g/dL (ref 3.5–5.0)
Alkaline Phosphatase: 118 U/L (ref 40–150)
Anion Gap: 10 mEq/L (ref 3–11)
BUN: 12 mg/dL (ref 7.0–26.0)
CHLORIDE: 104 meq/L (ref 98–109)
CO2: 27 meq/L (ref 22–29)
Calcium: 9 mg/dL (ref 8.4–10.4)
Creatinine: 0.8 mg/dL (ref 0.7–1.3)
Glucose: 134 mg/dl (ref 70–140)
Potassium: 4.2 mEq/L (ref 3.5–5.1)
SODIUM: 140 meq/L (ref 136–145)
Total Bilirubin: 0.51 mg/dL (ref 0.20–1.20)
Total Protein: 6.8 g/dL (ref 6.4–8.3)

## 2015-10-02 LAB — CBC WITH DIFFERENTIAL/PLATELET
BASO%: 0.5 % (ref 0.0–2.0)
Basophils Absolute: 0 10*3/uL (ref 0.0–0.1)
EOS ABS: 0.2 10*3/uL (ref 0.0–0.5)
EOS%: 2.8 % (ref 0.0–7.0)
HCT: 38.7 % (ref 38.4–49.9)
HGB: 12.7 g/dL — ABNORMAL LOW (ref 13.0–17.1)
LYMPH%: 16.7 % (ref 14.0–49.0)
MCH: 29.7 pg (ref 27.2–33.4)
MCHC: 32.8 g/dL (ref 32.0–36.0)
MCV: 90.4 fL (ref 79.3–98.0)
MONO#: 0.4 10*3/uL (ref 0.1–0.9)
MONO%: 6.3 % (ref 0.0–14.0)
NEUT#: 4.3 10*3/uL (ref 1.5–6.5)
NEUT%: 73.7 % (ref 39.0–75.0)
PLATELETS: 142 10*3/uL (ref 140–400)
RBC: 4.28 10*6/uL (ref 4.20–5.82)
RDW: 13.7 % (ref 11.0–14.6)
WBC: 5.8 10*3/uL (ref 4.0–10.3)
lymph#: 1 10*3/uL (ref 0.9–3.3)

## 2015-10-02 NOTE — Progress Notes (Signed)
Hematology and Oncology Follow Up Visit  [name]:[id number] [birth year] 64 y.o. 10/02/2015 1:16 PM Sallee Lange, MDLuking, Elayne Snare, MD   Principle Diagnosis: 64 year old gentleman with thrombocytopenia likely ITP diagnosed in July 2016. He presented with platelets less than 5 petechiae and ecchymosis.  Past therapy: Prednisone 120 mg daily with complete response in August 2016. He was tapered off completely and September 2016.   Current therapy: Radiation and surveillance.  Interim History: [name] presents today for a follow-up visit. Since his last visit, he reports no major complaints. He did have one episode of epistaxis that was self-limited likely related to dry nasal mucosa. He does not report any hematochezia, petechiae, ecchymosis or any signs of bleeding. He have completely tapered off prednisone back in September and have been doing relatively well off of it. His lower extremity swelling have completely subsided at this time. He regained all activities of daily living.  He does not report any neurological deficits, headaches, blurry vision syncope or seizures. He does not report any fevers, chills, sweats or weight loss. He does not report any chest pain, orthopnea or leg edema. He does not report any cough, hemoptysis or hematemesis. Does not report any nausea, vomiting or abdominal pain. Does not report any frequency urgency or hesitancy. Does not report any skeletal complaints. Remaining review of systems unremarkable.  Medications: I have reviewed the patient's current medications.  Current Outpatient Prescriptions  Medication Sig Dispense Refill  . albuterol (PROVENTIL HFA;VENTOLIN HFA) 108 (90 BASE) MCG/ACT inhaler Inhale 2 puffs into the lungs every 6 (six) hours as needed for wheezing or shortness of breath. 1 Inhaler 3  . ALPRAZolam (XANAX) 0.5 MG tablet Take 0.5 mg by mouth at bedtime.    Marland Kitchen atorvastatin (LIPITOR) 80 MG tablet TAKE ONE TABLET BY MOUTH ONCE DAILY. 90  tablet 3  . cetirizine (ZYRTEC) 10 MG tablet Take 10 mg by mouth at bedtime.      . citalopram (CELEXA) 20 MG tablet TAKE 1 TABLET BY MOUTH EVERY MORNING. 90 tablet 3  . doxazosin (CARDURA) 4 MG tablet TAKE 1 TABLET BY MOUTH EVERY MORNING. 90 tablet 3  . doxycycline (VIBRAMYCIN) 100 MG capsule Take 1 capsule (100 mg total) by mouth 2 (two) times daily. 20 capsule 0  . furosemide (LASIX) 20 MG tablet Take 1 tablet (20 mg total) by mouth every morning. 30 tablet 4  . levothyroxine (SYNTHROID) 112 MCG tablet Take 1 tablet (112 mcg total) by mouth daily before breakfast. 30 tablet 5  . metoprolol (LOPRESSOR) 25 MG tablet Take 2 tablets (50 mg total) by mouth 2 (two) times daily. 60 tablet 5  . morphine (MS CONTIN) 60 MG 12 hr tablet Take 60 mg by mouth every 8 (eight) hours. Scheduled doses for pain    . morphine (MSIR) 15 MG tablet Take 15 mg by mouth every 6 (six) hours as needed for severe pain.    . Multiple Vitamin (MULTIVITAMIN) capsule Take 1 capsule by mouth daily.      . nitroGLYCERIN (NITROSTAT) 0.4 MG SL tablet Place 0.4 mg under the tongue every 5 (five) minutes as needed for chest pain.    . pantoprazole (PROTONIX) 40 MG tablet TAKE 1 TABLET BY MOUTH ONCE A DAY. 90 tablet 3  . potassium chloride (K-DUR,KLOR-CON) 10 MEQ tablet Take 1 tablet (10 mEq total) by mouth 2 (two) times daily. 60 tablet 4   No current facility-administered medications for this visit.     Allergies:  Allergies  Allergen Reactions  . Penicillins Other (See Comments)    "I passed out last time I had it and they told me not to take it again."  . Sulfa Antibiotics Swelling  . Zoloft [Sertraline Hcl] Other (See Comments)    Confusion     Past Medical History, Surgical history, Social history, and Family History were reviewed and updated.   Physical Exam: Blood pressure 157/79, pulse 64, temperature 97.8 F (36.6 C), temperature source Oral, resp. rate 18, height 5\' 11"  (1.803 m), weight 282 lb (127.914  kg), SpO2 100 %. ECOG: 0 General appearance: well-appearing gentleman without distress. Head: Normocephalic, without obvious abnormality no oral ulcers or lesions. Neck: no adenopathy Lymph nodes: Cervical, supraclavicular, and axillary nodes normal. Heart:regular rate and rhythm, S1, S2 normal, no murmur, click, rub or gallop Lung:chest clear, no wheezing, rales, normal symmetric air entry Abdomin: soft, non-tender, without masses or organomegaly no shifting dullness or ascites. EXT:no edema noted.   Lab Results: Lab Results  Component Value Date   WBC 5.8 10/02/2015   HGB 12.7* 10/02/2015   HCT 38.7 10/02/2015   MCV 90.4 10/02/2015   PLT 142 10/02/2015     Chemistry      Component Value Date/Time   NA 139 06/24/2015 0811   NA 139 06/04/2015 1005   NA 138 05/25/2015 1841   K 3.8 06/24/2015 0811   K 3.4* 06/04/2015 1005   CL 96* 06/24/2015 0811   CO2 28 06/24/2015 0811   CO2 31* 06/04/2015 1005   BUN 12 06/24/2015 0811   BUN 9.8 06/04/2015 1005   BUN 10 05/25/2015 1841   CREATININE 0.76 06/24/2015 0811   CREATININE 0.8 06/04/2015 1005   CREATININE 0.88 01/14/2014 0947      Component Value Date/Time   CALCIUM 8.8 06/24/2015 0811   CALCIUM 8.5 06/04/2015 1005   ALKPHOS 109 06/04/2015 1005   ALKPHOS 102 05/25/2015 1841   AST 12 06/04/2015 1005   AST 22 05/25/2015 1841   ALT 20 06/04/2015 1005   ALT 21 05/25/2015 1841   BILITOT 0.41 06/04/2015 1005   BILITOT 1.0 05/25/2015 1841   BILITOT 0.9 05/25/2015 1148      Impression and Plan:  64 year old gentleman with the following issues:  1. Thrombocytopenia due to ITP diagnosed in July 2016. He presented with a platelet count of 16,000 and had a complete response to prednisone with a normal platelet count at that time. His prednisone has been completely tapered off in September 2016.  His laboratory testing from today appear within normal range including his platelet count. The plan is to continue with active  surveillance and repeat laboratory testing in 6 months. I gave instructions for signs or symptoms of bleeding and to report to me if these occur between visits.   2. Follow-up: Will be in 6 months to recheck his platelet counts at that time.  Oscar Button, MD 12/2/20161:16 PM

## 2015-10-02 NOTE — Telephone Encounter (Signed)
per pof to sch pt app-gae pt copy of avs

## 2015-11-16 DIAGNOSIS — Z79891 Long term (current) use of opiate analgesic: Secondary | ICD-10-CM | POA: Diagnosis not present

## 2015-11-16 DIAGNOSIS — M961 Postlaminectomy syndrome, not elsewhere classified: Secondary | ICD-10-CM | POA: Diagnosis not present

## 2015-11-16 DIAGNOSIS — M4726 Other spondylosis with radiculopathy, lumbar region: Secondary | ICD-10-CM | POA: Diagnosis not present

## 2015-11-16 DIAGNOSIS — G894 Chronic pain syndrome: Secondary | ICD-10-CM | POA: Diagnosis not present

## 2015-12-17 ENCOUNTER — Other Ambulatory Visit: Payer: Self-pay | Admitting: Family Medicine

## 2016-01-05 ENCOUNTER — Telehealth: Payer: Self-pay | Admitting: Family Medicine

## 2016-01-05 DIAGNOSIS — Z01818 Encounter for other preprocedural examination: Secondary | ICD-10-CM | POA: Diagnosis not present

## 2016-01-05 NOTE — Telephone Encounter (Signed)
Spoke with patient and informed him per Dr.Scott [name]- we are ordering a CBC that will have white blood count for oral surgeon. Patient verbalized understanding.

## 2016-01-05 NOTE — Telephone Encounter (Signed)
CBC

## 2016-01-05 NOTE — Telephone Encounter (Signed)
Pt was told by his oral surgeon to call here and have you order  bw for a platelet count to be done as soon as possible before  He does oral surgery on him the 14th of Mar  Dr Sabra Heck in [place] [phone]

## 2016-01-06 LAB — CBC WITH DIFFERENTIAL/PLATELET
BASOS: 1 %
Basophils Absolute: 0 10*3/uL (ref 0.0–0.2)
EOS (ABSOLUTE): 0.1 10*3/uL (ref 0.0–0.4)
EOS: 3 %
Hematocrit: 37.5 % (ref 37.5–51.0)
Hemoglobin: 12.2 g/dL — ABNORMAL LOW (ref 12.6–17.7)
IMMATURE GRANS (ABS): 0 10*3/uL (ref 0.0–0.1)
IMMATURE GRANULOCYTES: 0 %
Lymphocytes Absolute: 0.6 10*3/uL — ABNORMAL LOW (ref 0.7–3.1)
Lymphs: 15 %
MCH: 28.6 pg (ref 26.6–33.0)
MCHC: 32.5 g/dL (ref 31.5–35.7)
MCV: 88 fL (ref 79–97)
MONOCYTES: 7 %
Monocytes Absolute: 0.3 10*3/uL (ref 0.1–0.9)
Neutrophils Absolute: 3.1 10*3/uL (ref 1.4–7.0)
Neutrophils: 74 %
Platelets: 177 10*3/uL (ref 150–379)
RBC: 4.27 x10E6/uL (ref 4.14–5.80)
RDW: 14.5 % (ref 12.3–15.4)
WBC: 4.2 10*3/uL (ref 3.4–10.8)

## 2016-01-11 DIAGNOSIS — Z79891 Long term (current) use of opiate analgesic: Secondary | ICD-10-CM | POA: Diagnosis not present

## 2016-01-11 DIAGNOSIS — M4726 Other spondylosis with radiculopathy, lumbar region: Secondary | ICD-10-CM | POA: Diagnosis not present

## 2016-01-11 DIAGNOSIS — G894 Chronic pain syndrome: Secondary | ICD-10-CM | POA: Diagnosis not present

## 2016-01-11 DIAGNOSIS — M961 Postlaminectomy syndrome, not elsewhere classified: Secondary | ICD-10-CM | POA: Diagnosis not present

## 2016-01-25 ENCOUNTER — Encounter: Payer: Self-pay | Admitting: Family Medicine

## 2016-01-25 ENCOUNTER — Ambulatory Visit (INDEPENDENT_AMBULATORY_CARE_PROVIDER_SITE_OTHER): Payer: Medicare Other | Admitting: Family Medicine

## 2016-01-25 VITALS — BP 132/76 | Temp 99.7°F | Ht 71.0 in | Wt 275.0 lb

## 2016-01-25 DIAGNOSIS — J019 Acute sinusitis, unspecified: Secondary | ICD-10-CM | POA: Diagnosis not present

## 2016-01-25 DIAGNOSIS — J111 Influenza due to unidentified influenza virus with other respiratory manifestations: Secondary | ICD-10-CM | POA: Diagnosis not present

## 2016-01-25 MED ORDER — CLINDAMYCIN HCL 300 MG PO CAPS: ORAL_CAPSULE | ORAL | Status: DC

## 2016-01-25 MED ORDER — DOXYCYCLINE HYCLATE 100 MG PO CAPS: 100.0000 mg | ORAL_CAPSULE | Freq: Two times a day (BID) | ORAL | Status: DC

## 2016-01-25 NOTE — Patient Instructions (Signed)

## 2016-01-25 NOTE — Progress Notes (Signed)
   Subjective:    Patient ID: [name], male    DOB: 1951-08-22, 65 y.o.   MRN: [id number]  Cough This is a new problem. Episode onset: 2 days. Associated symptoms include chest pain, a fever, headaches, myalgias, nasal congestion, a sore throat and wheezing. Treatments tried: tylenol.  Patient with approximate 2-3 day history of body aches fever chills sweats denies nausea vomiting diarrhea relates mainly headache body aches not feeling good PMH benign  Review of Systems  Constitutional: Positive for fever.  HENT: Positive for sore throat.   Respiratory: Positive for cough and wheezing.   Cardiovascular: Positive for chest pain.  Musculoskeletal: Positive for myalgias.  Neurological: Positive for headaches.       Objective:   Physical Exam  Constitutional: He appears well-developed.  HENT:  Head: Normocephalic.  Mouth/Throat: Oropharynx is clear and moist. No oropharyngeal exudate.  Neck: Normal range of motion.  Cardiovascular: Normal rate, regular rhythm and normal heart sounds.   No murmur heard. Pulmonary/Chest: Effort normal and breath sounds normal. He has no wheezes.  Lymphadenopathy:    He has no cervical adenopathy.  Neurological: He exhibits normal muscle tone.  Skin: Skin is warm and dry.  Nursing note and vitals reviewed.         Assessment & Plan:  Influenza-the patient was diagnosed with influenza. Patient/family educated about the flu and warning signs to watch for. If difficulty breathing, severe neck pain and stiffness, cyanosis, disorientation, or progressive worsening then immediately get rechecked at that ER. If progressive symptoms be certain to be rechecked. Supportive measures such as Tylenol/ibuprofen was discussed. No aspirin use in children. And influenza home care instruction sheet was given. There may be some early secondary sinusitis get antibiotic filled if worse over the next 2-3 days warning signs discussed

## 2016-03-08 DIAGNOSIS — G894 Chronic pain syndrome: Secondary | ICD-10-CM | POA: Diagnosis not present

## 2016-03-08 DIAGNOSIS — M961 Postlaminectomy syndrome, not elsewhere classified: Secondary | ICD-10-CM | POA: Diagnosis not present

## 2016-03-08 DIAGNOSIS — M4726 Other spondylosis with radiculopathy, lumbar region: Secondary | ICD-10-CM | POA: Diagnosis not present

## 2016-03-08 DIAGNOSIS — Z79891 Long term (current) use of opiate analgesic: Secondary | ICD-10-CM | POA: Diagnosis not present

## 2016-03-18 ENCOUNTER — Telehealth: Payer: Self-pay | Admitting: Oncology

## 2016-03-18 NOTE — Telephone Encounter (Signed)
lvm advising appt chg due to md pal  from 6/2 to 6/27 at 8.30am.

## 2016-03-21 ENCOUNTER — Other Ambulatory Visit: Payer: Self-pay | Admitting: Family Medicine

## 2016-04-01 ENCOUNTER — Other Ambulatory Visit: Payer: Medicare Other

## 2016-04-01 ENCOUNTER — Ambulatory Visit: Payer: Medicare Other | Admitting: Oncology

## 2016-04-25 ENCOUNTER — Other Ambulatory Visit: Payer: Self-pay | Admitting: Family Medicine

## 2016-04-26 ENCOUNTER — Ambulatory Visit: Payer: Medicare Other | Admitting: Oncology

## 2016-04-26 ENCOUNTER — Other Ambulatory Visit: Payer: Medicare Other

## 2016-05-23 ENCOUNTER — Other Ambulatory Visit: Payer: Self-pay | Admitting: Family Medicine

## 2016-05-30 ENCOUNTER — Ambulatory Visit (INDEPENDENT_AMBULATORY_CARE_PROVIDER_SITE_OTHER): Payer: Medicare Other | Admitting: Nurse Practitioner

## 2016-05-30 ENCOUNTER — Encounter: Payer: Self-pay | Admitting: Nurse Practitioner

## 2016-05-30 VITALS — BP 112/74 | Ht 71.0 in | Wt 271.4 lb

## 2016-05-30 DIAGNOSIS — D693 Immune thrombocytopenic purpura: Secondary | ICD-10-CM | POA: Diagnosis not present

## 2016-05-30 DIAGNOSIS — K219 Gastro-esophageal reflux disease without esophagitis: Secondary | ICD-10-CM | POA: Diagnosis not present

## 2016-05-30 DIAGNOSIS — E038 Other specified hypothyroidism: Secondary | ICD-10-CM | POA: Diagnosis not present

## 2016-05-30 DIAGNOSIS — I10 Essential (primary) hypertension: Secondary | ICD-10-CM | POA: Diagnosis not present

## 2016-05-30 DIAGNOSIS — E782 Mixed hyperlipidemia: Secondary | ICD-10-CM | POA: Diagnosis not present

## 2016-05-30 DIAGNOSIS — Z125 Encounter for screening for malignant neoplasm of prostate: Secondary | ICD-10-CM | POA: Diagnosis not present

## 2016-05-30 MED ORDER — NITROGLYCERIN 0.4 MG SL SUBL: 0.4000 mg | SUBLINGUAL_TABLET | SUBLINGUAL | 2 refills | Status: DC | PRN

## 2016-05-30 MED ORDER — METOPROLOL TARTRATE 50 MG PO TABS: 50.0000 mg | ORAL_TABLET | Freq: Two times a day (BID) | ORAL | 1 refills | Status: DC

## 2016-05-30 MED ORDER — LEVOTHYROXINE SODIUM 112 MCG PO TABS: ORAL_TABLET | ORAL | 1 refills | Status: DC

## 2016-05-30 MED ORDER — PANTOPRAZOLE SODIUM 40 MG PO TBEC: 40.0000 mg | DELAYED_RELEASE_TABLET | Freq: Every day | ORAL | 1 refills | Status: DC

## 2016-05-30 MED ORDER — CITALOPRAM HYDROBROMIDE 20 MG PO TABS: 20.0000 mg | ORAL_TABLET | Freq: Every morning | ORAL | 1 refills | Status: DC

## 2016-05-30 MED ORDER — FUROSEMIDE 20 MG PO TABS: ORAL_TABLET | ORAL | 1 refills | Status: DC

## 2016-05-30 MED ORDER — POTASSIUM CHLORIDE CRYS ER 10 MEQ PO TBCR: 10.0000 meq | EXTENDED_RELEASE_TABLET | Freq: Two times a day (BID) | ORAL | 1 refills | Status: DC

## 2016-05-30 MED ORDER — DOXAZOSIN MESYLATE 4 MG PO TABS: 4.0000 mg | ORAL_TABLET | Freq: Every morning | ORAL | 1 refills | Status: DC

## 2016-05-30 MED ORDER — ALBUTEROL SULFATE HFA 108 (90 BASE) MCG/ACT IN AERS: 2.0000 | INHALATION_SPRAY | RESPIRATORY_TRACT | 2 refills | Status: DC | PRN

## 2016-05-30 MED ORDER — ATORVASTATIN CALCIUM 80 MG PO TABS: 80.0000 mg | ORAL_TABLET | Freq: Every day | ORAL | 1 refills | Status: DC

## 2016-06-02 ENCOUNTER — Encounter: Payer: Self-pay | Admitting: Nurse Practitioner

## 2016-06-02 NOTE — Progress Notes (Signed)
Subjective:  Presents for routine follow-up. Has seen a slight increase in shortness of breath during the very hot weather, some relief with albuterol. No chest pain/ischemic type pain. Continues to see her specialist for his pain management. Furosemide working well for his edema. Compliant with medications. Celexa working well for anxiety and depression. Reflux has been stable on Protonix. Has gained weight due to recent use of steroids related to ITP. Has gained mainly around the abdominal area.  Objective:   BP 112/74   Ht 5\' 11"  (1.803 m)   Wt 271 lb 6 oz (123.1 kg)   BMI 37.85 kg/m  NAD. Alert, oriented. Lungs clear. Heart regular rate rhythm. Abdomen soft nontender.  Assessment:  Problem List Items Addressed This Visit      Cardiovascular and Mediastinum   Essential hypertension   Relevant Medications   atorvastatin (LIPITOR) 80 MG tablet   doxazosin (CARDURA) 4 MG tablet   furosemide (LASIX) 20 MG tablet   nitroGLYCERIN (NITROSTAT) 0.4 MG SL tablet   metoprolol (LOPRESSOR) 50 MG tablet   Other Relevant Orders   Lipid panel   Hepatic function panel   Basic metabolic panel     Endocrine   Hypothyroidism - Primary   Relevant Medications   levothyroxine (SYNTHROID, LEVOTHROID) 112 MCG tablet   metoprolol (LOPRESSOR) 50 MG tablet   Other Relevant Orders   TSH     Hematopoietic and Hemostatic   ITP (idiopathic thrombocytopenic purpura)   Relevant Orders   CBC with Differential/Platelet     Other   Mixed hyperlipidemia   Relevant Medications   atorvastatin (LIPITOR) 80 MG tablet   doxazosin (CARDURA) 4 MG tablet   furosemide (LASIX) 20 MG tablet   nitroGLYCERIN (NITROSTAT) 0.4 MG SL tablet   metoprolol (LOPRESSOR) 50 MG tablet   Other Relevant Orders   Lipid panel   Hepatic function panel   Basic metabolic panel    Other Visit Diagnoses    Gastroesophageal reflux disease without esophagitis       Relevant Medications   pantoprazole (PROTONIX) 40 MG tablet   Screening for prostate cancer       Relevant Orders   PSA     Plan:  Meds ordered this encounter  Medications  . albuterol (PROVENTIL HFA;VENTOLIN HFA) 108 (90 Base) MCG/ACT inhaler    Sig: Inhale 2 puffs into the lungs every 4 (four) hours as needed for wheezing or shortness of breath.    Dispense:  1 Inhaler    Refill:  2    Order Specific Question:   Supervising Provider    Answer:   Mikey Kirschner [[id number]]  . atorvastatin (LIPITOR) 80 MG tablet    Sig: Take 1 tablet (80 mg total) by mouth daily.    Dispense:  90 tablet    Refill:  1    Order Specific Question:   Supervising Provider    Answer:   Mikey Kirschner [[id number]]  . citalopram (CELEXA) 20 MG tablet    Sig: Take 1 tablet (20 mg total) by mouth every morning.    Dispense:  90 tablet    Refill:  1    Order Specific Question:   Supervising Provider    Answer:   Mikey Kirschner [[id number]]  . doxazosin (CARDURA) 4 MG tablet    Sig: Take 1 tablet (4 mg total) by mouth every morning.    Dispense:  90 tablet    Refill:  1    Order Specific Question:   Supervising  Provider    Answer:   Mikey Kirschner [[id number]]  . furosemide (LASIX) 20 MG tablet    Sig: TAKE 1 TABLET BY MOUTH EACH MORNING.    Dispense:  90 tablet    Refill:  1    Order Specific Question:   Supervising Provider    Answer:   Mikey Kirschner [[id number]]  . levothyroxine (SYNTHROID, LEVOTHROID) 112 MCG tablet    Sig: TAKE 1 TABLET BY MOUTH DAILY BEFORE BREAKFAST.    Dispense:  90 tablet    Refill:  1    Order Specific Question:   Supervising Provider    Answer:   Mikey Kirschner [[id number]]  . nitroGLYCERIN (NITROSTAT) 0.4 MG SL tablet    Sig: Place 1 tablet (0.4 mg total) under the tongue every 5 (five) minutes as needed for chest pain.    Dispense:  10 tablet    Refill:  2    Order Specific Question:   Supervising Provider    Answer:   Mikey Kirschner [[id number]]  . pantoprazole (PROTONIX) 40 MG tablet    Sig: Take 1 tablet (40 mg total) by mouth daily.     Dispense:  90 tablet    Refill:  1    Order Specific Question:   Supervising Provider    Answer:   Mikey Kirschner [[id number]]  . potassium chloride (K-DUR,KLOR-CON) 10 MEQ tablet    Sig: Take 1 tablet (10 mEq total) by mouth 2 (two) times daily.    Dispense:  180 tablet    Refill:  1    Order Specific Question:   Supervising Provider    Answer:   Mikey Kirschner [[id number]]  . metoprolol (LOPRESSOR) 50 MG tablet    Sig: Take 1 tablet (50 mg total) by mouth 2 (two) times daily.    Dispense:  180 tablet    Refill:  1    Order Specific Question:   Supervising Provider    Answer:   Mikey Kirschner [[id number]]   Recommend routine follow-up with cardiology. Recommend activity as tolerated and weight loss. Return in about 6 months (around 11/30/2016) for recheck.

## 2016-06-06 DIAGNOSIS — E782 Mixed hyperlipidemia: Secondary | ICD-10-CM | POA: Diagnosis not present

## 2016-06-06 DIAGNOSIS — D693 Immune thrombocytopenic purpura: Secondary | ICD-10-CM | POA: Diagnosis not present

## 2016-06-06 DIAGNOSIS — E038 Other specified hypothyroidism: Secondary | ICD-10-CM | POA: Diagnosis not present

## 2016-06-06 DIAGNOSIS — I10 Essential (primary) hypertension: Secondary | ICD-10-CM | POA: Diagnosis not present

## 2016-06-06 DIAGNOSIS — Z125 Encounter for screening for malignant neoplasm of prostate: Secondary | ICD-10-CM | POA: Diagnosis not present

## 2016-06-07 ENCOUNTER — Telehealth: Payer: Self-pay | Admitting: Family Medicine

## 2016-06-07 DIAGNOSIS — I25119 Atherosclerotic heart disease of native coronary artery with unspecified angina pectoris: Secondary | ICD-10-CM

## 2016-06-07 LAB — LIPID PANEL
Chol/HDL Ratio: 5 ratio units (ref 0.0–5.0)
Cholesterol, Total: 213 mg/dL — ABNORMAL HIGH (ref 100–199)
HDL: 43 mg/dL (ref >39)
LDL Calculated: 156 mg/dL — ABNORMAL HIGH (ref 0–99)
Triglycerides: 71 mg/dL (ref 0–149)
VLDL CHOLESTEROL CAL: 14 mg/dL (ref 5–40)

## 2016-06-07 LAB — CBC WITH DIFFERENTIAL/PLATELET
BASOS ABS: 0 10*3/uL (ref 0.0–0.2)
BASOS: 0 %
EOS (ABSOLUTE): 0.1 10*3/uL (ref 0.0–0.4)
Eos: 3 %
HEMATOCRIT: 34.8 % — AB (ref 37.5–51.0)
Hemoglobin: 11.4 g/dL — ABNORMAL LOW (ref 12.6–17.7)
IMMATURE GRANS (ABS): 0 10*3/uL (ref 0.0–0.1)
Immature Granulocytes: 0 %
LYMPHS: 15 %
Lymphocytes Absolute: 0.8 10*3/uL (ref 0.7–3.1)
MCH: 28.7 pg (ref 26.6–33.0)
MCHC: 32.8 g/dL (ref 31.5–35.7)
MCV: 88 fL (ref 79–97)
MONOS ABS: 0.4 10*3/uL (ref 0.1–0.9)
Monocytes: 9 %
NEUTROS PCT: 73 %
Neutrophils Absolute: 3.7 10*3/uL (ref 1.4–7.0)
PLATELETS: 116 10*3/uL — AB (ref 150–379)
RBC: 3.97 x10E6/uL — AB (ref 4.14–5.80)
RDW: 14.6 % (ref 12.3–15.4)
WBC: 5.1 10*3/uL (ref 3.4–10.8)

## 2016-06-07 LAB — BASIC METABOLIC PANEL
BUN/Creatinine Ratio: 11 (ref 10–24)
BUN: 8 mg/dL (ref 8–27)
CALCIUM: 8.4 mg/dL — AB (ref 8.6–10.2)
CO2: 26 mmol/L (ref 18–29)
Chloride: 99 mmol/L (ref 96–106)
Creatinine, Ser: 0.76 mg/dL (ref 0.76–1.27)
GFR calc Af Amer: 111 mL/min/{1.73_m2} (ref >59)
GFR, EST NON AFRICAN AMERICAN: 96 mL/min/{1.73_m2} (ref >59)
GLUCOSE: 134 mg/dL — AB (ref 65–99)
POTASSIUM: 4.3 mmol/L (ref 3.5–5.2)
SODIUM: 141 mmol/L (ref 134–144)

## 2016-06-07 LAB — TSH: TSH: 4.41 u[IU]/mL (ref 0.450–4.500)

## 2016-06-07 LAB — HEPATIC FUNCTION PANEL
ALBUMIN: 4 g/dL (ref 3.6–4.8)
ALK PHOS: 83 IU/L (ref 39–117)
ALT: 10 IU/L (ref 0–44)
AST: 15 IU/L (ref 0–40)
Bilirubin Total: 0.7 mg/dL (ref 0.0–1.2)
Bilirubin, Direct: 0.19 mg/dL (ref 0.00–0.40)
TOTAL PROTEIN: 6.2 g/dL (ref 6.0–8.5)

## 2016-06-07 LAB — PSA: PROSTATE SPECIFIC AG, SERUM: 0.3 ng/mL (ref 0.0–4.0)

## 2016-06-07 NOTE — Telephone Encounter (Signed)
Pt needs a referral to Dr. Johnsie Cancel.  It's been a long time since he was seen & they need referral from PCP  Please initiate in system so that I may process  Please schedule with Lindi Adie, due to Mr. Pugh will be Mr. Hammersley transportation

## 2016-06-07 NOTE — Telephone Encounter (Signed)
Please initiate referral coronary artery disease

## 2016-06-07 NOTE — Telephone Encounter (Signed)
Order for referral put in.  

## 2016-06-08 ENCOUNTER — Telehealth: Payer: Self-pay | Admitting: Nurse Practitioner

## 2016-06-08 NOTE — Telephone Encounter (Signed)
[name],  Pt looking for the results to his BW from a few days ago

## 2016-06-08 NOTE — Telephone Encounter (Signed)
See my chart message

## 2016-06-14 ENCOUNTER — Telehealth: Payer: Self-pay

## 2016-06-14 NOTE — Telephone Encounter (Signed)
Notified patient message sent to him via [employer] by Hoyle Sauer: [name], are you taking cholesterol med everyday? Liver tests, kidney tests, thyroid tests are all normal. Prostate lab is normal. You are mildly anemic and your platelet count has fallen over the past 6 months. Your last office visit with your hematologist was in December, recommend a recheck to discuss your blood counts. Has Dr. Nicki Reaper spoken with you about your sugar? I did not see anything on your problem list. Hoyle Sauer  Patient states he will schedule follow up with hematology. He does take his cholesterol medication everyday. Dr. Nicki Reaper has not spoken to him about his sugar.

## 2016-06-17 ENCOUNTER — Other Ambulatory Visit: Payer: Self-pay | Admitting: Oncology

## 2016-06-17 ENCOUNTER — Telehealth: Payer: Self-pay | Admitting: Oncology

## 2016-06-17 DIAGNOSIS — D693 Immune thrombocytopenic purpura: Secondary | ICD-10-CM

## 2016-06-17 NOTE — Telephone Encounter (Signed)
Spoke with pt to confirm 8/29 appt at noon per [facility] LOS

## 2016-06-18 NOTE — Telephone Encounter (Signed)
Review cholesterol with Dr. Johnsie Cancel on 8/29. Recommend A1C at his next visit in our office. This was ordered awhile back but not done. This will give [place] an idea of his sugar over the past 3 months.

## 2016-06-20 NOTE — Telephone Encounter (Signed)
Spoke with patient and informed him per Linzie Collin- Review cholesterol with Dr. Johnsie Cancel on 8/29. Recommend A1C at his next visit in our office. This was ordered awhile back but not done. This will give [place] an idea of his sugar over the past 3 months. Patient verbalized understanding.

## 2016-06-27 NOTE — Progress Notes (Signed)
Cardiology Office Note   Date:  06/28/2016   ID:  [name], DOB 12-Sep-1951, MRN [id number]  PCP:  Sallee Lange, MD  Cardiologist:   Jenkins Rouge, MD   No chief complaint on file.     History of Present Illness: [name] is a 65 y.o. male who presents for evaluation of ? CAD.  Has some issues with anxiety and chronic pain. Some dyspnea and recent weight gain on steriods for ITP.  Takes lasix for LE edema  History  Of HTN and elevated lipids on statin .   Distant history of CAD  Stents in circumflex/right and LAD  Last cath Dr Lia Foyer 2012.  With restenting of OM branch patent LAD Stent and diffuse RCA disease not focal.  Initial stents placed by Dr Lyndel Safe in 2003   Cannot walk on treadmill due to left knee pain. Last myovue said he felt horrible and had a reaction but was just intense dyspnea And some chest pain. From adenosine   Lab Results  Component Value Date   LDLCALC 156 (H) 06/06/2016        Past Medical History:  Diagnosis Date  . Allergy   . Carpal tunnel syndrome   . Chronic back pain   . Coronary artery disease   . Degenerative disc disease   . GERD (gastroesophageal reflux disease)   . History of thrombocytopenia   . Hypercholesteremia   . Hypertension   . Hypothyroid   . Lumbar pain   . Myocardial infarction ([place]) 2009  . Shortness of breath dyspnea     Past Surgical History:  Procedure Laterality Date  . BACK SURGERY     5 lumbas disc with cervical and lumbar fusions  . BIOPSY N/A 03/06/2014   Procedure: ESOPHAGEAL BIOPSIES;  Surgeon: [name], MD;  Location: [facility];  Service: Endoscopy;  Laterality: N/A;  . COLONOSCOPY    . COLONOSCOPY WITH PROPOFOL N/A 03/06/2014   Procedure: COLONOSCOPY WITH PROPOFOL;  Surgeon: [name], MD;  Location: [facility];  Service: Endoscopy;  Laterality: N/A;  in cecum at 0807; total withdrawal time 9 minutes  . CORONARY ANGIOPLASTY WITH STENT PLACEMENT     2000, and 2004 has 3 stents  .  ESOPHAGOGASTRODUODENOSCOPY (EGD) WITH PROPOFOL N/A 03/06/2014   Procedure: ESOPHAGOGASTRODUODENOSCOPY (EGD) WITH PROPOFOL;  Surgeon: [name], MD;  Location: [facility];  Service: Endoscopy;  Laterality: N/A;  . LUMBAR FUSION  2009  . MALONEY DILATION N/A 03/06/2014   Procedure: MALONEY DILATION 54 french;  Surgeon: [name], MD;  Location: [facility];  Service: Endoscopy;  Laterality: N/A;  . NECK SURGERY       Current Outpatient Prescriptions  Medication Sig Dispense Refill  . albuterol (PROVENTIL HFA;VENTOLIN HFA) 108 (90 Base) MCG/ACT inhaler Inhale 2 puffs into the lungs every 4 (four) hours as needed for wheezing or shortness of breath. 1 Inhaler 2  . ALPRAZolam (XANAX) 0.5 MG tablet Take 0.5 mg by mouth at bedtime.    Marland Kitchen atorvastatin (LIPITOR) 80 MG tablet Take 1 tablet (80 mg total) by mouth daily. 90 tablet 1  . cetirizine (ZYRTEC) 10 MG tablet Take 10 mg by mouth at bedtime.      . citalopram (CELEXA) 20 MG tablet Take 1 tablet (20 mg total) by mouth every morning. 90 tablet 1  . clindamycin (CLEOCIN) 300 MG capsule 2 po 1 hour before dental procedure 2 capsule 5  . doxazosin (CARDURA) 4 MG tablet Take  1 tablet (4 mg total) by mouth every morning. 90 tablet 1  . doxycycline (VIBRAMYCIN) 100 MG capsule Take 1 capsule (100 mg total) by mouth 2 (two) times daily. 20 capsule 0  . furosemide (LASIX) 20 MG tablet TAKE 1 TABLET BY MOUTH EACH MORNING. 90 tablet 1  . levothyroxine (SYNTHROID, LEVOTHROID) 112 MCG tablet TAKE 1 TABLET BY MOUTH DAILY BEFORE BREAKFAST. 90 tablet 1  . metoprolol (LOPRESSOR) 50 MG tablet Take 1 tablet (50 mg total) by mouth 2 (two) times daily. 180 tablet 1  . morphine (MS CONTIN) 60 MG 12 hr tablet Take 60 mg by mouth every 8 (eight) hours. Scheduled doses for pain    . morphine (MSIR) 15 MG tablet Take 15 mg by mouth every 6 (six) hours as needed for severe pain.    . Multiple Vitamin (MULTIVITAMIN) capsule Take 1 capsule by mouth daily.      . nitroGLYCERIN  (NITROSTAT) 0.4 MG SL tablet Place 1 tablet (0.4 mg total) under the tongue every 5 (five) minutes as needed for chest pain. 10 tablet 2  . pantoprazole (PROTONIX) 40 MG tablet Take 1 tablet (40 mg total) by mouth daily. 90 tablet 1  . potassium chloride (K-DUR,KLOR-CON) 10 MEQ tablet Take 1 tablet (10 mEq total) by mouth 2 (two) times daily. 180 tablet 1   No current facility-administered medications for this visit.     Allergies:   Penicillins; Sulfa antibiotics; and Zoloft [sertraline hcl]    Social History:  The patient  reports that he quit smoking about 24 years ago. His smoking use included Cigarettes. He has a 40.00 pack-year smoking history. He has never used smokeless tobacco. He reports that he does not drink alcohol or use drugs.   Family History:  The patient's family history includes Heart attack in his father.    ROS:  Please see the history of present illness.   Otherwise, review of systems are positive for none.   All other systems are reviewed and negative.    PHYSICAL EXAM: VS:  BP (!) 160/90   Pulse (!) 108   Ht 5\' 11"  (1.803 m)   Wt 263 lb (119.3 kg)   SpO2 98%   BMI 36.68 kg/m  , BMI Body mass index is 36.68 kg/m. Affect appropriate Healthy:  appears stated age 65: normal Neck supple with no adenopathy JVP normal no bruits no thyromegaly Lungs clear with no wheezing and good diaphragmatic motion Heart:  S1/S2 no murmur, no rub, gallop or click PMI normal Abdomen: benighn, BS positve, no tenderness, no AAA no bruit.  No HSM or HJR Distal pulses intact with no bruits No edema Neuro non-focal Skin warm and dry No muscular weakness    EKG:  03/03/14  SR normal ECG  06/28/16  SR rate 86  T wave inversions 3, F    Recent Labs: 10/02/2015: HGB 12.7 06/06/2016: ALT 10; BUN 8; Creatinine, Ser 0.76; Platelets 116; Potassium 4.3; Sodium 141; TSH 4.410    Lipid Panel    Component Value Date/Time   CHOL 213 (H) 06/06/2016 0906   TRIG 71 06/06/2016 0906     HDL 43 06/06/2016 0906   CHOLHDL 5.0 06/06/2016 0906   CHOLHDL 5.0 07/05/2013 0850   VLDL 24 07/05/2013 0850   LDLCALC 156 (H) 06/06/2016 0906      Wt Readings from Last 3 Encounters:  06/28/16 263 lb (119.3 kg)  05/30/16 271 lb 6 oz (123.1 kg)  01/25/16 275 lb (124.7 kg)  Other studies Reviewed: Additional studies/ records that were reviewed today include: Cath notes notes primary Dr Wolfgang Phoenix .    ASSESSMENT AND PLAN:  1.  CAD:  Multiple previous stents with exertional dyspnea and off antiplatelets due to ITP f/u lexiscan myovue 2. Dyspnea: previously normal EF f/u Echo for RV/LV function 3. ITP on steroids will be an issue if he has recurrent CAD and needs stenting  4. Chol continue high dose lipitor consider adding zetia 5. HTN;  Well controlled.  Continue current medications and low sodium Dash type diet.      Current medicines are reviewed at length with the patient today.  The patient does not have concerns regarding medicines.  The following changes have been made:  no change  Labs/ tests ordered today include: Lexiscan myovue and Echo   Orders Placed This Encounter  Procedures  . NM Myocar Multi W/Spect W/Wall Motion / EF  . EKG 12-Lead  . EKG 12-Lead  . ECHOCARDIOGRAM COMPLETE     Disposition:   FU with [place] 6 months      Signed, Jenkins Rouge, MD  06/28/2016 10:16 AM    [facility] [place], [place], [place]  [address] Phone: [phone]; Fax: [phone]

## 2016-06-28 ENCOUNTER — Ambulatory Visit (INDEPENDENT_AMBULATORY_CARE_PROVIDER_SITE_OTHER): Payer: Medicare Other | Admitting: Cardiovascular Disease

## 2016-06-28 ENCOUNTER — Other Ambulatory Visit (HOSPITAL_BASED_OUTPATIENT_CLINIC_OR_DEPARTMENT_OTHER): Payer: Medicare Other

## 2016-06-28 ENCOUNTER — Ambulatory Visit (HOSPITAL_BASED_OUTPATIENT_CLINIC_OR_DEPARTMENT_OTHER): Payer: Medicare Other | Admitting: Oncology

## 2016-06-28 ENCOUNTER — Encounter: Payer: Self-pay | Admitting: Cardiovascular Disease

## 2016-06-28 ENCOUNTER — Telehealth: Payer: Self-pay | Admitting: Oncology

## 2016-06-28 VITALS — BP 145/93 | HR 73 | Temp 97.6°F | Resp 18 | Ht 71.0 in | Wt 264.3 lb

## 2016-06-28 VITALS — BP 160/90 | HR 108 | Ht 71.0 in | Wt 263.0 lb

## 2016-06-28 DIAGNOSIS — R0600 Dyspnea, unspecified: Secondary | ICD-10-CM

## 2016-06-28 DIAGNOSIS — I10 Essential (primary) hypertension: Secondary | ICD-10-CM | POA: Diagnosis not present

## 2016-06-28 DIAGNOSIS — R072 Precordial pain: Secondary | ICD-10-CM

## 2016-06-28 DIAGNOSIS — D693 Immune thrombocytopenic purpura: Secondary | ICD-10-CM

## 2016-06-28 LAB — CBC WITH DIFFERENTIAL/PLATELET
BASO%: 0.1 % (ref 0.0–2.0)
Basophils Absolute: 0 10*3/uL (ref 0.0–0.1)
EOS%: 0.1 % (ref 0.0–7.0)
Eosinophils Absolute: 0 10*3/uL (ref 0.0–0.5)
HEMATOCRIT: 41.8 % (ref 38.4–49.9)
HGB: 13.9 g/dL (ref 13.0–17.1)
LYMPH#: 0.8 10*3/uL — AB (ref 0.9–3.3)
LYMPH%: 9.1 % — ABNORMAL LOW (ref 14.0–49.0)
MCH: 28.8 pg (ref 27.2–33.4)
MCHC: 33.3 g/dL (ref 32.0–36.0)
MCV: 86.5 fL (ref 79.3–98.0)
MONO#: 0.4 10*3/uL (ref 0.1–0.9)
MONO%: 4.4 % (ref 0.0–14.0)
NEUT#: 7.1 10*3/uL — ABNORMAL HIGH (ref 1.5–6.5)
NEUT%: 86.3 % — AB (ref 39.0–75.0)
Platelets: 163 10*3/uL (ref 140–400)
RBC: 4.83 10*6/uL (ref 4.20–5.82)
RDW: 13 % (ref 11.0–14.6)
WBC: 8.3 10*3/uL (ref 4.0–10.3)

## 2016-06-28 LAB — CHCC SMEAR

## 2016-06-28 NOTE — Progress Notes (Signed)
Hematology and Oncology Follow Up Visit  KYERE GALATI IC:[id number] 30-Jan-1951 65 y.o. 06/28/2016 12:26 PM Sallee Lange, M[place], Elayne Snare, MD   Principle Diagnosis: 65 year old gentleman with thrombocytopenia likely ITP diagnosed in July 2016. He presented with platelets less than 5 petechiae and ecchymosis.  Past therapy: Prednisone 120 mg daily with complete response in August 2016. He was tapered off completely and September 2016.   Current therapy: Active surveillance.  Interim History: Mr. Sthill presents today for a follow-up visit. Since his last visit, he reports some slight fatigue and tiredness. He had a CBC done 3 weeks ago which showed mild anemia at hemoglobin 11.6 and thrombocytopenia as well as 116. He is asymptomatic otherwise. He denied any bleeding episodes. He does not report any hematochezia, petechiae, ecchymosis or any signs of bleeding. He remains reasonably active and performs activities of daily living. He was evaluated by cardiology earlier today and he will have a stress test in the near future.  He does not report any neurological deficits, headaches, blurry vision syncope or seizures. He does not report any fevers, chills, sweats or weight loss. He does not report any chest pain, orthopnea or leg edema. He does not report any cough, hemoptysis or hematemesis. Does not report any nausea, vomiting or abdominal pain. Does not report any frequency urgency or hesitancy. Does not report any skeletal complaints. Remaining review of systems unremarkable.  Medications: I have reviewed the patient's current medications.  Current Outpatient Prescriptions  Medication Sig Dispense Refill  . albuterol (PROVENTIL HFA;VENTOLIN HFA) 108 (90 Base) MCG/ACT inhaler Inhale 2 puffs into the lungs every 4 (four) hours as needed for wheezing or shortness of breath. 1 Inhaler 2  . ALPRAZolam (XANAX) 0.5 MG tablet Take 0.5 mg by mouth at bedtime.    Marland Kitchen atorvastatin (LIPITOR) 80 MG tablet Take 1  tablet (80 mg total) by mouth daily. 90 tablet 1  . cetirizine (ZYRTEC) 10 MG tablet Take 10 mg by mouth at bedtime.      . citalopram (CELEXA) 20 MG tablet Take 1 tablet (20 mg total) by mouth every morning. 90 tablet 1  . clindamycin (CLEOCIN) 300 MG capsule 2 po 1 hour before dental procedure 2 capsule 5  . doxazosin (CARDURA) 4 MG tablet Take 1 tablet (4 mg total) by mouth every morning. 90 tablet 1  . doxycycline (VIBRAMYCIN) 100 MG capsule Take 1 capsule (100 mg total) by mouth 2 (two) times daily. 20 capsule 0  . furosemide (LASIX) 20 MG tablet TAKE 1 TABLET BY MOUTH EACH MORNING. 90 tablet 1  . levothyroxine (SYNTHROID, LEVOTHROID) 112 MCG tablet TAKE 1 TABLET BY MOUTH DAILY BEFORE BREAKFAST. 90 tablet 1  . metoprolol (LOPRESSOR) 50 MG tablet Take 1 tablet (50 mg total) by mouth 2 (two) times daily. 180 tablet 1  . morphine (MS CONTIN) 60 MG 12 hr tablet Take 60 mg by mouth every 8 (eight) hours. Scheduled doses for pain    . morphine (MSIR) 15 MG tablet Take 15 mg by mouth every 6 (six) hours as needed for severe pain.    . Multiple Vitamin (MULTIVITAMIN) capsule Take 1 capsule by mouth daily.      . nitroGLYCERIN (NITROSTAT) 0.4 MG SL tablet Place 1 tablet (0.4 mg total) under the tongue every 5 (five) minutes as needed for chest pain. 10 tablet 2  . pantoprazole (PROTONIX) 40 MG tablet Take 1 tablet (40 mg total) by mouth daily. 90 tablet 1  . potassium chloride (K-DUR,KLOR-CON) 10 MEQ  tablet Take 1 tablet (10 mEq total) by mouth 2 (two) times daily. 180 tablet 1   No current facility-administered medications for this visit.      Allergies:  Allergies  Allergen Reactions  . Penicillins Other (See Comments)    "I passed out last time I had it and they told me not to take it again."  . Sulfa Antibiotics Swelling  . Zoloft [Sertraline Hcl] Other (See Comments)    Confusion     Past Medical History, Surgical history, Social history, and Family History were reviewed and  updated.   Physical Exam: Blood pressure (!) 145/93, pulse 73, temperature 97.6 F (36.4 C), temperature source Oral, resp. rate 18, height 5\' 11"  (1.803 m), weight 264 lb 4.8 oz (119.9 kg), SpO2 99 %. ECOG: 0 General appearance: Alert, awake gentleman without distress. Head: Normocephalic, without obvious abnormality no oral bleeding. Neck: no adenopathy Lymph nodes: Cervical, supraclavicular, and axillary nodes normal. Heart:regular rate and rhythm, S1, S2 normal, no murmur, click, rub or gallop Lung:chest clear, no wheezing, rales, normal symmetric air entry Abdomin: soft, non-tender, without masses or organomegaly no rebound or guarding. EXT:no edema noted.   Lab Results: Lab Results  Component Value Date   WBC 8.3 06/28/2016   HGB 13.9 06/28/2016   HCT 41.8 06/28/2016   MCV 86.5 06/28/2016   PLT 163 06/28/2016     Chemistry      Component Value Date/Time   NA 141 06/06/2016 0906   NA 140 10/02/2015 1259   K 4.3 06/06/2016 0906   K 4.2 10/02/2015 1259   CL 99 06/06/2016 0906   CO2 26 06/06/2016 0906   CO2 27 10/02/2015 1259   BUN 8 06/06/2016 0906   BUN 12.0 10/02/2015 1259   CREATININE 0.76 06/06/2016 0906   CREATININE 0.8 10/02/2015 1259      Component Value Date/Time   CALCIUM 8.4 (L) 06/06/2016 0906   CALCIUM 9.0 10/02/2015 1259   ALKPHOS 83 06/06/2016 0906   ALKPHOS 118 10/02/2015 1259   AST 15 06/06/2016 0906   AST 12 10/02/2015 1259   ALT 10 06/06/2016 0906   ALT 10 10/02/2015 1259   BILITOT 0.7 06/06/2016 0906   BILITOT 0.51 10/02/2015 1259      Impression and Plan:  65 year old gentleman with the following issues:  1. Thrombocytopenia due to ITP diagnosed in July 2016. He presented with a platelet count of 16,000 and had a complete response to prednisone with a normal platelet count at that time. His prednisone has been completely tapered off in September 2016.  His CBC was repeated today and appears to be within normal range. His hemoglobin  is 13.9 with a platelet count of 163.  At this time, no further intervention is needed and I recommended continued surveillance. I will repeat his count in 6 months.   2. Follow-up: Will be in 6 months to recheck his platelet counts at that time.  [facility], MD 8/29/201712:26 PM

## 2016-06-28 NOTE — Telephone Encounter (Signed)
AVS REPORT AND APPT SCHD GIVEN PER 08/29 LOS. °

## 2016-06-28 NOTE — Patient Instructions (Signed)
Your physician wants you to follow-up in: 1 year Dr Blima Singer will receive a reminder letter in the mail two months in advance. If you don't receive a letter, please call our office to schedule the follow-up appointment.    Your physician recommends that you continue on your current medications as directed. Please refer to the Current Medication list given to you today.   Your physician has requested that you have a lexiscan myoview. For further information please visit [email or web address]. Please follow instruction sheet, as given.   Your physician has requested that you have an echocardiogram. Echocardiography is a painless test that uses sound waves to create images of your heart. It provides your doctor with information about the size and shape of your heart and how well your heart's chambers and valves are working. This procedure takes approximately one hour. There are no restrictions for this procedure.      Thank you for choosing [facility] !

## 2016-07-03 ENCOUNTER — Encounter (HOSPITAL_COMMUNITY): Payer: Self-pay

## 2016-07-03 ENCOUNTER — Emergency Department (HOSPITAL_COMMUNITY)
Admission: EM | Admit: 2016-07-03 | Discharge: 2016-07-03 | Disposition: A | Discharge status: Home / Self Care | Payer: Medicare Other | Attending: Emergency Medicine | Admitting: Emergency Medicine

## 2016-07-03 DIAGNOSIS — Z955 Presence of coronary angioplasty implant and graft: Secondary | ICD-10-CM | POA: Insufficient documentation

## 2016-07-03 DIAGNOSIS — E039 Hypothyroidism, unspecified: Secondary | ICD-10-CM | POA: Diagnosis not present

## 2016-07-03 DIAGNOSIS — K068 Other specified disorders of gingiva and edentulous alveolar ridge: Secondary | ICD-10-CM | POA: Diagnosis present

## 2016-07-03 DIAGNOSIS — Z79899 Other long term (current) drug therapy: Secondary | ICD-10-CM | POA: Diagnosis not present

## 2016-07-03 DIAGNOSIS — I2510 Atherosclerotic heart disease of native coronary artery without angina pectoris: Secondary | ICD-10-CM | POA: Diagnosis not present

## 2016-07-03 DIAGNOSIS — I10 Essential (primary) hypertension: Secondary | ICD-10-CM | POA: Diagnosis not present

## 2016-07-03 DIAGNOSIS — Z87891 Personal history of nicotine dependence: Secondary | ICD-10-CM | POA: Diagnosis not present

## 2016-07-03 DIAGNOSIS — D696 Thrombocytopenia, unspecified: Secondary | ICD-10-CM | POA: Diagnosis not present

## 2016-07-03 DIAGNOSIS — D693 Immune thrombocytopenic purpura: Secondary | ICD-10-CM | POA: Diagnosis not present

## 2016-07-03 DIAGNOSIS — I252 Old myocardial infarction: Secondary | ICD-10-CM | POA: Diagnosis not present

## 2016-07-03 LAB — CBC WITH DIFFERENTIAL/PLATELET
BASOS ABS: 0 10*3/uL (ref 0.0–0.1)
BASOS PCT: 0 %
EOS ABS: 0.2 10*3/uL (ref 0.0–0.7)
EOS PCT: 3 %
HCT: 36.8 % — ABNORMAL LOW (ref 39.0–52.0)
Hemoglobin: 11.5 g/dL — ABNORMAL LOW (ref 13.0–17.0)
LYMPHS PCT: 19 %
Lymphs Abs: 1 10*3/uL (ref 0.7–4.0)
MCH: 28.1 pg (ref 26.0–34.0)
MCHC: 31.3 g/dL (ref 30.0–36.0)
MCV: 90 fL (ref 78.0–100.0)
Monocytes Absolute: 0.4 10*3/uL (ref 0.1–1.0)
Monocytes Relative: 8 %
Neutro Abs: 3.5 10*3/uL (ref 1.7–7.7)
Neutrophils Relative %: 70 %
PLATELETS: 120 10*3/uL — AB (ref 150–400)
RBC: 4.09 MIL/uL — AB (ref 4.22–5.81)
RDW: 12.9 % (ref 11.5–15.5)
WBC: 5.1 10*3/uL (ref 4.0–10.5)

## 2016-07-03 LAB — BASIC METABOLIC PANEL
ANION GAP: 5 (ref 5–15)
BUN: 9 mg/dL (ref 6–20)
CALCIUM: 8.9 mg/dL (ref 8.9–10.3)
CO2: 27 mmol/L (ref 22–32)
Chloride: 105 mmol/L (ref 101–111)
Creatinine, Ser: 0.7 mg/dL (ref 0.61–1.24)
Glucose, Bld: 137 mg/dL — ABNORMAL HIGH (ref 65–99)
POTASSIUM: 3.7 mmol/L (ref 3.5–5.1)
Sodium: 137 mmol/L (ref 135–145)

## 2016-07-03 NOTE — ED Triage Notes (Signed)
Patient has history of low platelets and today noticed small patches of petechia to legs and abdomen. Was directed to ED to have labs collected. Alert and oriented, NAD

## 2016-07-03 NOTE — ED Provider Notes (Signed)
[facility] DEPT Provider Note   CSN: [id number] Arrival date & time: 07/03/16  1701     History   Chief Complaint Chief Complaint  Patient presents with  . Bleeding/Bruising    HPI [name] is a 65 y.o. male.  [name] is a 65 yo with history of ITP and HTN presenting with one day of petechiae and bleeding from his gums.  Patient was in his normal state of health until this morning when he woke up and noticed red bumps on his R shin and abdomen.  He says the bumps look like petechiae he developed prior to being diagnosed with ITP one year ago.  Since noticing the bumps, the petechiae have expanded on his abdomen.  Petechiae are not itchy or painful.    He also noticed some gum bleeding when drinking a Coke this morning and when brushing his teeth.  Other than gum bleeding and petechiae he has been feeling fatigued today, but denies any other symptoms.  Patient says symptoms are similar to one year ago when he was diagnosed with ITP, during which time he was admitted to the hospital and given steroids until platelet count rose.  He had been asymptomatic up until today.  He was most recently seen by oncology this week, during which time his platelet count was 163.  Denies lightheadedness, CP, palpitations, SOB, nausea, vomiting, diarrhea or constipation.  No bruising or joint swelling. He has not started any new medications recently.       Past Medical History:  Diagnosis Date  . Allergy   . Carpal tunnel syndrome   . Chronic back pain   . Coronary artery disease   . Degenerative disc disease   . GERD (gastroesophageal reflux disease)   . History of thrombocytopenia   . Hypercholesteremia   . Hypertension   . Hypothyroid   . Lumbar pain   . Myocardial infarction ([place]) 2009  . Shortness of breath dyspnea     Patient Active Problem List   Diagnosis Date Noted  . Osteopenia 08/13/2015  . Thrombocytopenia ([place]) 05/25/2015  . ITP (idiopathic thrombocytopenic purpura)  05/25/2015  . Hypothyroidism 08/29/2014  . Diverticulitis of colon (without mention of hemorrhage) 02/04/2014  . Dysphagia, unspecified(787.20) 02/04/2014  . Hyperlipidemia 11/18/2013  . Dyspnea 01/02/2012  . Palpitations 01/02/2012  . CAD (coronary artery disease) 07/06/2011  . Mixed hyperlipidemia 07/06/2011  . TOTAL KNEE FOLLOW-UP 08/14/2008  . BACK PAIN 07/23/2008  . KNEE, ARTHRITIS, DEGEN./OSTEO 05/29/2008  . JOINT EFFUSION, KNEE 04/30/2008  . Pain in joint, lower leg 10/10/2007  . Essential hypertension 10/09/2007    Past Surgical History:  Procedure Laterality Date  . BACK SURGERY     5 lumbas disc with cervical and lumbar fusions  . BIOPSY N/A 03/06/2014   Procedure: ESOPHAGEAL BIOPSIES;  Surgeon: [name], MD;  Location: [facility];  Service: Endoscopy;  Laterality: N/A;  . COLONOSCOPY    . COLONOSCOPY WITH PROPOFOL N/A 03/06/2014   Procedure: COLONOSCOPY WITH PROPOFOL;  Surgeon: [name], MD;  Location: [facility];  Service: Endoscopy;  Laterality: N/A;  in cecum at 0807; total withdrawal time 9 minutes  . CORONARY ANGIOPLASTY WITH STENT PLACEMENT     2000, and 2004 has 3 stents  . ESOPHAGOGASTRODUODENOSCOPY (EGD) WITH PROPOFOL N/A 03/06/2014   Procedure: ESOPHAGOGASTRODUODENOSCOPY (EGD) WITH PROPOFOL;  Surgeon: [name], MD;  Location: [facility];  Service: Endoscopy;  Laterality: N/A;  . LUMBAR FUSION  2009  . MALONEY DILATION N/A  03/06/2014   Procedure: MALONEY DILATION 54 french;  Surgeon: [name], MD;  Location: [facility];  Service: Endoscopy;  Laterality: N/A;  . NECK SURGERY         Home Medications    Prior to Admission medications   Medication Sig Start Date End Date Taking? Authorizing Provider  albuterol (PROVENTIL HFA;VENTOLIN HFA) 108 (90 Base) MCG/ACT inhaler Inhale 2 puffs into the lungs every 4 (four) hours as needed for wheezing or shortness of breath. 05/30/16   Nilda Simmer, NP  ALPRAZolam Duanne Moron) 0.5 MG tablet Take 0.5 mg by mouth at  bedtime. 06/01/15   Historical Provider, MD  atorvastatin (LIPITOR) 80 MG tablet Take 1 tablet (80 mg total) by mouth daily. 05/30/16   Nilda Simmer, NP  cetirizine (ZYRTEC) 10 MG tablet Take 10 mg by mouth at bedtime.      Historical Provider, MD  citalopram (CELEXA) 20 MG tablet Take 1 tablet (20 mg total) by mouth every morning. 05/30/16   Nilda Simmer, NP  clindamycin (CLEOCIN) 300 MG capsule 2 po 1 hour before dental procedure 01/25/16   Kathyrn Drown, MD  doxazosin (CARDURA) 4 MG tablet Take 1 tablet (4 mg total) by mouth every morning. 05/30/16   Nilda Simmer, NP  doxycycline (VIBRAMYCIN) 100 MG capsule Take 1 capsule (100 mg total) by mouth 2 (two) times daily. 01/25/16   Kathyrn Drown, MD  furosemide (LASIX) 20 MG tablet TAKE 1 TABLET BY MOUTH EACH MORNING. 05/30/16   Nilda Simmer, NP  levothyroxine (SYNTHROID, LEVOTHROID) 112 MCG tablet TAKE 1 TABLET BY MOUTH DAILY BEFORE BREAKFAST. 05/30/16   Nilda Simmer, NP  metoprolol (LOPRESSOR) 50 MG tablet Take 1 tablet (50 mg total) by mouth 2 (two) times daily. 05/30/16   Nilda Simmer, NP  morphine (MS CONTIN) 60 MG 12 hr tablet Take 60 mg by mouth every 8 (eight) hours. Scheduled doses for pain    Historical Provider, MD  morphine (MSIR) 15 MG tablet Take 15 mg by mouth every 6 (six) hours as needed for severe pain.    Historical Provider, MD  Multiple Vitamin (MULTIVITAMIN) capsule Take 1 capsule by mouth daily.      Historical Provider, MD  nitroGLYCERIN (NITROSTAT) 0.4 MG SL tablet Place 1 tablet (0.4 mg total) under the tongue every 5 (five) minutes as needed for chest pain. 05/30/16   Nilda Simmer, NP  pantoprazole (PROTONIX) 40 MG tablet Take 1 tablet (40 mg total) by mouth daily. 05/30/16   Nilda Simmer, NP  potassium chloride (K-DUR,KLOR-CON) 10 MEQ tablet Take 1 tablet (10 mEq total) by mouth 2 (two) times daily. 05/30/16   Nilda Simmer, NP    Family History Family History  Problem Relation Age of  Onset  . Heart attack Father     Social History Social History  Substance Use Topics  . Smoking status: Former Smoker    Packs/day: 2.00    Years: 20.00    Types: Cigarettes    Quit date: 07/06/1991  . Smokeless tobacco: Never Used  . Alcohol use No     Comment: occassional     Allergies   Penicillins; Sulfa antibiotics; and Zoloft [sertraline hcl]   Review of Systems Review of Systems  Constitutional: Positive for fatigue. Negative for appetite change, chills and diaphoresis.  HENT: Negative for congestion, postnasal drip and sore throat.   Eyes: Negative for visual disturbance.  Respiratory: Negative for cough, chest tightness, shortness of  breath and wheezing.   Cardiovascular: Negative for chest pain, palpitations and leg swelling.  Gastrointestinal: Negative for abdominal distention, abdominal pain, constipation, diarrhea, nausea and vomiting.  Endocrine: Negative for cold intolerance and heat intolerance.  Genitourinary: Negative for dysuria.  Musculoskeletal: Negative for back pain and joint swelling.  Skin: Positive for rash (petechiae on R shin and abdomen).  Neurological: Negative for dizziness, syncope and headaches.  Hematological: Bruises/bleeds easily (bleeding from gum).  Psychiatric/Behavioral: Negative for agitation. The patient is not nervous/anxious.      Physical Exam Updated Vital Signs BP 185/95 (BP Location: Left Arm)   Pulse 85   Temp 98.5 F (36.9 C) (Oral)   Resp 18   SpO2 98%   Physical Exam  Constitutional: He is oriented to person, place, and time. He appears well-nourished. No distress.  HENT:  Head: Normocephalic.  Eyes: Pupils are equal, round, and reactive to light.  Neck: Normal range of motion. Neck supple.  Cardiovascular: Normal rate and regular rhythm.   No murmur heard. Pulmonary/Chest: Effort normal and breath sounds normal. He has no wheezes.  Abdominal: Soft. Bowel sounds are normal. He exhibits no distension. There is  no tenderness.  Musculoskeletal: Normal range of motion. He exhibits edema (trace edema up to mid-shin bilaterally).  Neurological: He is alert and oriented to person, place, and time.  Skin: Skin is warm and dry. Rash (petechiae on R shin and abdomen) noted.  Psychiatric: He has a normal mood and affect. His behavior is normal. Judgment and thought content normal.     ED Treatments / Results  Labs (all labs ordered are listed, but only abnormal results are displayed) Labs Reviewed  CBC WITH DIFFERENTIAL/PLATELET - Abnormal; Notable for the following:       Result Value   RBC 4.09 (*)    Hemoglobin 11.5 (*)    HCT 36.8 (*)    Platelets 120 (*)    All other components within normal limits  BASIC METABOLIC PANEL - Abnormal; Notable for the following:    Glucose, Bld 137 (*)    All other components within normal limits    EKG  EKG Interpretation None       Radiology No results found.  Procedures Procedures (including critical care time)  Medications Ordered in ED Medications - No data to display   Initial Impression / Assessment and Plan / ED Course  I have reviewed the triage vital signs and the nursing notes.  Pertinent labs & imaging results that were available during my care of the patient were reviewed by me and considered in my medical decision making (see chart for details).  Clinical Course    65 yo with history of ITP presenting with one day of petechiae to R shin and abdomen as well as bleeding from gums.  Platelets 120 today, down from 163 on 8/29.  This likely presents recurrence of ITP.    ITP/thrombocytopenia: Consulted oncology (Dr. Lindi Adie) who recommended discharging patient today without prescribing steroids.  Will follow-up with oncology clinic on Tuesday.  Strict return precautions if petechiae spread or new bleeding/bruising.   Final Clinical Impressions(s) / ED Diagnoses   Final diagnoses:  Thrombocytopenia (HCC)  ITP (idiopathic  thrombocytopenic purpura)    New Prescriptions New Prescriptions   No medications on file     Isla Pence, MD 07/03/16 1931

## 2016-07-03 NOTE — Discharge Instructions (Signed)
Dr. Hazeline Junker office will call you for an appointment on Tuesday for a repeat CBC.  Return immediately to the ED for worsening of symptoms.

## 2016-07-05 ENCOUNTER — Inpatient Hospital Stay (HOSPITAL_COMMUNITY): Admission: RE | Admit: 2016-07-05 | Payer: Medicare Other | Source: Ambulatory Visit

## 2016-07-05 ENCOUNTER — Encounter (HOSPITAL_COMMUNITY): Payer: Medicare Other

## 2016-07-05 ENCOUNTER — Other Ambulatory Visit (HOSPITAL_COMMUNITY): Payer: Medicare Other

## 2016-07-05 ENCOUNTER — Telehealth: Payer: Self-pay | Admitting: *Deleted

## 2016-07-05 ENCOUNTER — Encounter (HOSPITAL_COMMUNITY): Payer: Self-pay

## 2016-07-05 NOTE — Telephone Encounter (Signed)
Patient calling to say he had petechiae on his legs and was sent from [place] [facility], where labs were drawn and platelet count is down to 50,000.

## 2016-07-26 DIAGNOSIS — Z79891 Long term (current) use of opiate analgesic: Secondary | ICD-10-CM | POA: Diagnosis not present

## 2016-11-15 DIAGNOSIS — M961 Postlaminectomy syndrome, not elsewhere classified: Secondary | ICD-10-CM | POA: Diagnosis not present

## 2016-11-15 DIAGNOSIS — M4726 Other spondylosis with radiculopathy, lumbar region: Secondary | ICD-10-CM | POA: Diagnosis not present

## 2016-11-15 DIAGNOSIS — M5412 Radiculopathy, cervical region: Secondary | ICD-10-CM | POA: Diagnosis not present

## 2016-11-15 DIAGNOSIS — M62838 Other muscle spasm: Secondary | ICD-10-CM | POA: Diagnosis not present

## 2016-11-19 ENCOUNTER — Emergency Department (HOSPITAL_COMMUNITY)
Admission: EM | Admit: 2016-11-19 | Discharge: 2016-11-19 | Disposition: A | Discharge status: Home / Self Care | Payer: Medicare Other | Attending: Emergency Medicine | Admitting: Emergency Medicine

## 2016-11-19 ENCOUNTER — Encounter (HOSPITAL_COMMUNITY): Payer: Self-pay | Admitting: Emergency Medicine

## 2016-11-19 DIAGNOSIS — I10 Essential (primary) hypertension: Secondary | ICD-10-CM | POA: Diagnosis not present

## 2016-11-19 DIAGNOSIS — M7989 Other specified soft tissue disorders: Secondary | ICD-10-CM | POA: Diagnosis not present

## 2016-11-19 DIAGNOSIS — Z79899 Other long term (current) drug therapy: Secondary | ICD-10-CM | POA: Insufficient documentation

## 2016-11-19 DIAGNOSIS — Z87891 Personal history of nicotine dependence: Secondary | ICD-10-CM | POA: Insufficient documentation

## 2016-11-19 DIAGNOSIS — I2510 Atherosclerotic heart disease of native coronary artery without angina pectoris: Secondary | ICD-10-CM | POA: Insufficient documentation

## 2016-11-19 LAB — CBC WITH DIFFERENTIAL/PLATELET
Basophils Absolute: 0 10*3/uL (ref 0.0–0.1)
Basophils Relative: 0 %
Eosinophils Absolute: 0.1 10*3/uL (ref 0.0–0.7)
Eosinophils Relative: 2 %
HCT: 36 % — ABNORMAL LOW (ref 39.0–52.0)
HEMOGLOBIN: 12 g/dL — AB (ref 13.0–17.0)
LYMPHS PCT: 17 %
Lymphs Abs: 0.9 10*3/uL (ref 0.7–4.0)
MCH: 29.3 pg (ref 26.0–34.0)
MCHC: 33.3 g/dL (ref 30.0–36.0)
MCV: 88 fL (ref 78.0–100.0)
Monocytes Absolute: 0.4 10*3/uL (ref 0.1–1.0)
Monocytes Relative: 8 %
NEUTROS ABS: 3.9 10*3/uL (ref 1.7–7.7)
NEUTROS PCT: 73 %
Platelets: 131 10*3/uL — ABNORMAL LOW (ref 150–400)
RBC: 4.09 MIL/uL — AB (ref 4.22–5.81)
RDW: 13.5 % (ref 11.5–15.5)
WBC: 5.4 10*3/uL (ref 4.0–10.5)

## 2016-11-19 LAB — BASIC METABOLIC PANEL
Anion gap: 6 (ref 5–15)
BUN: 15 mg/dL (ref 6–20)
CHLORIDE: 101 mmol/L (ref 101–111)
CO2: 31 mmol/L (ref 22–32)
Calcium: 8.8 mg/dL — ABNORMAL LOW (ref 8.9–10.3)
Creatinine, Ser: 0.84 mg/dL (ref 0.61–1.24)
GFR calc Af Amer: >60 mL/min (ref >60)
GFR calc non Af Amer: >60 mL/min (ref >60)
Glucose, Bld: 104 mg/dL — ABNORMAL HIGH (ref 65–99)
POTASSIUM: 4.3 mmol/L (ref 3.5–5.1)
SODIUM: 138 mmol/L (ref 135–145)

## 2016-11-19 MED ORDER — ENOXAPARIN SODIUM 150 MG/ML ~~LOC~~ SOLN
1.0000 mg/kg | Freq: Once | SUBCUTANEOUS | Status: AC
  Administered 2016-11-19: 130 mg via SUBCUTANEOUS
  Filled 2016-11-19: qty 1

## 2016-11-19 NOTE — ED Triage Notes (Signed)
Pt reports LLE swelling and rednessto foot. Pulse palatable +2. Pt reports hx of knee replace in that leg 8 years ago. Pt denies recent injury.

## 2016-11-19 NOTE — ED Provider Notes (Signed)
[facility] DEPT Provider Note   CSN: [id number] Arrival date & time: 11/19/16  1749     History   Chief Complaint Chief Complaint  Patient presents with  . Leg Swelling    HPI [name] is a 66 y.o. male.  The history is provided by the patient.   CC: leg swelling  Onset/Duration: 3 days Timing: constant, worse today Location: left leg Quality: swelling Severity: moderate Modifying Factors:  Improved by: nothing  Worsened by: nothing Associated Signs/Symptoms:  Pertinent (+): hyperemia of the foot,   Pertinent (-): no injury, pain, fevers, chills Context: pt has baseline edema in both legs, and left leg has been intermittently worsening. He reports that over the past several days he has been sitting and sleeping in a chair due to his chronic neck pain.  H/o prior DVT in left leg 34 yrs ago. No recent surgeries or travel.   Past Medical History:  Diagnosis Date  . Allergy   . Carpal tunnel syndrome   . Chronic back pain   . Coronary artery disease   . Degenerative disc disease   . GERD (gastroesophageal reflux disease)   . History of thrombocytopenia   . Hypercholesteremia   . Hypertension   . Hypothyroid   . Lumbar pain   . Myocardial infarction 2009  . Shortness of breath dyspnea     Patient Active Problem List   Diagnosis Date Noted  . Osteopenia 08/13/2015  . Thrombocytopenia ([place]) 05/25/2015  . ITP (idiopathic thrombocytopenic purpura) 05/25/2015  . Hypothyroidism 08/29/2014  . Diverticulitis of colon (without mention of hemorrhage)(562.11) 02/04/2014  . Dysphagia, unspecified(787.20) 02/04/2014  . Hyperlipidemia 11/18/2013  . Dyspnea 01/02/2012  . Palpitations 01/02/2012  . CAD (coronary artery disease) 07/06/2011  . Mixed hyperlipidemia 07/06/2011  . TOTAL KNEE FOLLOW-UP 08/14/2008  . BACK PAIN 07/23/2008  . KNEE, ARTHRITIS, DEGEN./OSTEO 05/29/2008  . JOINT EFFUSION, KNEE 04/30/2008  . Pain in joint, lower leg 10/10/2007  .  Essential hypertension 10/09/2007    Past Surgical History:  Procedure Laterality Date  . BACK SURGERY     5 lumbas disc with cervical and lumbar fusions  . BIOPSY N/A 03/06/2014   Procedure: ESOPHAGEAL BIOPSIES;  Surgeon: [name], MD;  Location: [facility];  Service: Endoscopy;  Laterality: N/A;  . COLONOSCOPY    . COLONOSCOPY WITH PROPOFOL N/A 03/06/2014   Procedure: COLONOSCOPY WITH PROPOFOL;  Surgeon: [name], MD;  Location: [facility];  Service: Endoscopy;  Laterality: N/A;  in cecum at 0807; total withdrawal time 9 minutes  . CORONARY ANGIOPLASTY WITH STENT PLACEMENT     2000, and 2004 has 3 stents  . ESOPHAGOGASTRODUODENOSCOPY (EGD) WITH PROPOFOL N/A 03/06/2014   Procedure: ESOPHAGOGASTRODUODENOSCOPY (EGD) WITH PROPOFOL;  Surgeon: [name], MD;  Location: [facility];  Service: Endoscopy;  Laterality: N/A;  . LUMBAR FUSION  2009  . MALONEY DILATION N/A 03/06/2014   Procedure: MALONEY DILATION 54 french;  Surgeon: [name], MD;  Location: [facility];  Service: Endoscopy;  Laterality: N/A;  . NECK SURGERY         Home Medications    Prior to Admission medications   Medication Sig Start Date End Date Taking? Authorizing Provider  albuterol (PROVENTIL HFA;VENTOLIN HFA) 108 (90 Base) MCG/ACT inhaler Inhale 2 puffs into the lungs every 4 (four) hours as needed for wheezing or shortness of breath. 05/30/16  Yes Nilda Simmer, NP  atorvastatin (LIPITOR) 80 MG tablet Take 1 tablet (80 mg total) by  mouth daily. 05/30/16  Yes Nilda Simmer, NP  cetirizine (ZYRTEC) 10 MG tablet Take 10 mg by mouth at bedtime.     Yes Historical Provider, MD  citalopram (CELEXA) 20 MG tablet Take 1 tablet (20 mg total) by mouth every morning. 05/30/16  Yes Nilda Simmer, NP  cyclobenzaprine (FLEXERIL) 5 MG tablet Take 5 mg by mouth 3 (three) times daily as needed for muscle spasms.  11/15/16  Yes Historical Provider, MD  doxazosin (CARDURA) 4 MG tablet Take 1 tablet (4 mg total) by mouth  every morning. 05/30/16  Yes Nilda Simmer, NP  furosemide (LASIX) 20 MG tablet TAKE 1 TABLET BY MOUTH EACH MORNING. 05/30/16  Yes Nilda Simmer, NP  levothyroxine (SYNTHROID, LEVOTHROID) 112 MCG tablet TAKE 1 TABLET BY MOUTH DAILY BEFORE BREAKFAST. 05/30/16  Yes Nilda Simmer, NP  metoprolol (LOPRESSOR) 50 MG tablet Take 1 tablet (50 mg total) by mouth 2 (two) times daily. 05/30/16  Yes Nilda Simmer, NP  morphine (MS CONTIN) 60 MG 12 hr tablet Take 60 mg by mouth every 8 (eight) hours. Scheduled doses for pain   Yes Historical Provider, MD  morphine (MSIR) 15 MG tablet Take 15 mg by mouth every 6 (six) hours as needed for severe pain.   Yes Historical Provider, MD  Multiple Vitamin (MULTIVITAMIN) capsule Take 1 capsule by mouth daily.     Yes Historical Provider, MD  nitroGLYCERIN (NITROSTAT) 0.4 MG SL tablet Place 1 tablet (0.4 mg total) under the tongue every 5 (five) minutes as needed for chest pain. 05/30/16  Yes Nilda Simmer, NP  pantoprazole (PROTONIX) 40 MG tablet Take 1 tablet (40 mg total) by mouth daily. 05/30/16  Yes Nilda Simmer, NP  potassium chloride (K-DUR,KLOR-CON) 10 MEQ tablet Take 1 tablet (10 mEq total) by mouth 2 (two) times daily. 05/30/16  Yes Nilda Simmer, NP    Family History Family History  Problem Relation Age of Onset  . Heart attack Father     Social History Social History  Substance Use Topics  . Smoking status: Former Smoker    Packs/day: 2.00    Years: 20.00    Types: Cigarettes    Quit date: 07/06/1991  . Smokeless tobacco: Never Used  . Alcohol use No     Comment: occassional     Allergies   Penicillins; Sulfa antibiotics; and Zoloft [sertraline hcl]   Review of Systems Review of Systems Ten systems are reviewed and are negative for acute change except as noted in the HPI   Physical Exam Updated Vital Signs BP 130/84   Pulse 73   Temp 97.7 F (36.5 C) (Oral)   Resp 18   Ht 5\' 11"  (1.803 m)   Wt 280 lb (127 kg)    SpO2 99%   BMI 39.05 kg/m   Physical Exam  Constitutional: He is oriented to person, place, and time. He appears well-developed and well-nourished. No distress.  HENT:  Head: Normocephalic and atraumatic.  Nose: Nose normal.  Eyes: Conjunctivae and EOM are normal. Pupils are equal, round, and reactive to light. Right eye exhibits no discharge. Left eye exhibits no discharge. No scleral icterus.  Neck: Normal range of motion. Neck supple.  Cardiovascular: Normal rate and regular rhythm.  Exam reveals no gallop and no friction rub.   No murmur heard. Pulmonary/Chest: Effort normal and breath sounds normal. No stridor. No respiratory distress. He has no rales.  Abdominal: Soft. He exhibits no distension. There is no  tenderness.  Musculoskeletal: He exhibits no edema or tenderness.  Bilateral lower extremity edema, left greater than right. Mild hyperemia of bilateral lower extremities, left greater than right. No tenderness to palpation light touch of either lower extremity. No open wounds.  Neurological: He is alert and oriented to person, place, and time.  Skin: Skin is warm and dry. No rash noted. He is not diaphoretic. No erythema.  Psychiatric: He has a normal mood and affect.  Vitals reviewed.    ED Treatments / Results  Labs (all labs ordered are listed, but only abnormal results are displayed) Labs Reviewed  CBC WITH DIFFERENTIAL/PLATELET - Abnormal; Notable for the following:       Result Value   RBC 4.09 (*)    Hemoglobin 12.0 (*)    HCT 36.0 (*)    Platelets 131 (*)    All other components within normal limits  BASIC METABOLIC PANEL - Abnormal; Notable for the following:    Glucose, Bld 104 (*)    Calcium 8.8 (*)    All other components within normal limits    EKG  EKG Interpretation None       Radiology No results found.  Procedures Procedures (including critical care time)  Medications Ordered in ED Medications  enoxaparin (LOVENOX) injection 130 mg  (not administered)     Initial Impression / Assessment and Plan / ED Course  I have reviewed the triage vital signs and the nursing notes.  Pertinent labs & imaging results that were available during my care of the patient were reviewed by me and considered in my medical decision making (see chart for details).     Likely venous insufficiency that was exacerbated by dependent positioning over the past several days. Low suspicion for cellulitis. Given the patient's history of prior unprovoked DVT and asymmetry of the swelling, would like to obtain ultrasound study to rule out DVT. Unfortunately at this time of night, ultrasound is not available to assess for DVT. Screening labs obtained and noted platelets of 131K, and normal renal function. Appropriate for single dose of Lovenox until patient is able to return in the morning for the Doppler study.   The patient is safe for discharge with strict return precautions.   Final Clinical Impressions(s) / ED Diagnoses   Final diagnoses:  Leg swelling   Disposition: Discharge  Condition: Good  I have discussed the results, Dx and Tx plan with the patient who expressed understanding and agree(s) with the plan. Discharge instructions discussed at great length. The patient was given strict return precautions who verbalized understanding of the instructions. No further questions at time of discharge.    New Prescriptions   No medications on file    Follow Up: [facility] [address] [id number] [place] [address] [phone] Go in 1 day at 1000 am for lower extremity US      Pedro Eduardo Cardama, MD 11/19/16 2135

## 2016-11-20 ENCOUNTER — Ambulatory Visit (HOSPITAL_COMMUNITY)
Admission: RE | Admit: 2016-11-20 | Discharge: 2016-11-20 | Disposition: A | Discharge status: Home / Self Care | Payer: Medicare Other | Source: Ambulatory Visit | Attending: Emergency Medicine | Admitting: Emergency Medicine

## 2016-11-20 DIAGNOSIS — M79605 Pain in left leg: Secondary | ICD-10-CM | POA: Diagnosis not present

## 2016-11-20 DIAGNOSIS — M7989 Other specified soft tissue disorders: Secondary | ICD-10-CM | POA: Diagnosis not present

## 2016-11-20 NOTE — ED Provider Notes (Signed)
10:49 AM [place] negative for DVT. Recommend elevation, compression stockings and pcp follow up  US Venous Img Lower Unilateral Left  Result Date: 11/20/2016 CLINICAL DATA:  Increased left leg pain and swelling for 3 days. Remote history of DVT. EXAM: LEFT LOWER EXTREMITY VENOUS DOPPLER ULTRASOUND TECHNIQUE: Gray-scale sonography with graded compression, as well as color Doppler and duplex ultrasound were performed to evaluate the lower extremity deep venous systems from the level of the common femoral vein and including the common femoral, femoral, profunda femoral, popliteal and calf veins including the posterior tibial, peroneal and gastrocnemius veins when visible. The superficial great saphenous vein was also interrogated. Spectral Doppler was utilized to evaluate flow at rest and with distal augmentation maneuvers in the common femoral, femoral and popliteal veins. COMPARISON:  None. FINDINGS: Contralateral Common Femoral Vein: Respiratory phasicity is normal and symmetric with the symptomatic side. No evidence of thrombus. Normal compressibility. Common Femoral Vein: No evidence of thrombus. Normal compressibility, respiratory phasicity and response to augmentation. Saphenofemoral Junction: No evidence of thrombus. Normal compressibility and flow on color Doppler imaging. Profunda Femoral Vein: No evidence of thrombus. Normal compressibility and flow on color Doppler imaging. Femoral Vein: No evidence of thrombus. Normal compressibility, respiratory phasicity and response to augmentation. Popliteal Vein: No evidence of thrombus. Normal compressibility, respiratory phasicity and response to augmentation. Calf Veins: No definite thrombus visualized. Visualization limited by soft tissue edema of left calf. Superficial Great Saphenous Vein: No evidence of thrombus. Normal compressibility and flow on color Doppler imaging. Venous Reflux:  None. Other Findings:  None. IMPRESSION: No evidence of deep venous  thrombosis. Electronically Signed   By: Earle Gell M.D.   On: 11/20/2016 10:44      Jola Schmidt, MD 11/20/16 1050

## 2016-11-21 ENCOUNTER — Other Ambulatory Visit (HOSPITAL_COMMUNITY): Payer: Medicare Other

## 2016-11-28 ENCOUNTER — Ambulatory Visit (INDEPENDENT_AMBULATORY_CARE_PROVIDER_SITE_OTHER): Payer: Medicare Other | Admitting: Family Medicine

## 2016-11-28 VITALS — BP 132/86 | Wt 300.2 lb

## 2016-11-28 DIAGNOSIS — R600 Localized edema: Secondary | ICD-10-CM | POA: Insufficient documentation

## 2016-11-28 MED ORDER — TORSEMIDE 20 MG PO TABS: ORAL_TABLET | ORAL | 4 refills | Status: DC

## 2016-11-28 MED ORDER — TAMSULOSIN HCL 0.4 MG PO CAPS: 0.4000 mg | ORAL_CAPSULE | Freq: Every day | ORAL | 5 refills | Status: DC

## 2016-11-28 NOTE — Patient Instructions (Addendum)
Chuck-please avoid excessive salt use. Also in addition to this it is important to his best as possible keep your legs elevated when you are sleeping as we discussed.  I recommend stopping furosemide. In its place you will use torsemide (which is called Demadex) 20 mg. Initially take 2 each morning for the next several mornings until you see improvement then try adjusting to taking 2 in the morning followed by one the following morning-in other words every other day take 2. If you're swelling is dramatically getting better then you more likely can reduce it down to just one each morning.  It will be important for you to follow-up in 3 months. We may decide to do additional blood work once the blood you get drawn today comes back. Should things get dramatically worse please notify us immediately thank you-Dr. Nicki Reaper

## 2016-11-28 NOTE — Progress Notes (Signed)
   Subjective:    Patient ID: [name], male    DOB: [birth year], 66 y.o.   MRN: [id number]  HPI Patient in office today for ED follow up.  Pt was seen at [facility] on 11/19/2016 for leg swelling. He states swelling is the same today as when he when to the ED.  He admits to having [place] of legs 11/20/16. Patient with severe bilateral leg swelling he states he's been eating popcorn with salt he is also been keeping his legs low by sitting in a recliner to sleep because of neck pain. He denies chest pressure tightness pain or shortness of breath. Denies nausea vomiting diarrhea fever chills.  Review of Systems See above.    Objective:   Physical Exam  Lungs clear no crackles hearts regular extremities trace edema near the knees significant 1-2 pitting edema in the lower legs      Assessment & Plan:  Significant pedal edema probably dependent pedal edema important to sleep in a position that allows of fluid to come out of the legs Switch away from furosemide instead will use Demadex 20 mg 1-2 every morning We will check BNP May need metabolic 7 in a few weeks Patient is a follow-up with [place] again within a few months time He is to give [place] feedback if the swelling is not going down over the next few weeks Patient does relate some decreased urinary flow he requests Flomax he states he's taken this before without trouble we will stop Cardura and start Flomax 0.4 mg daily at bedtime

## 2016-12-01 DIAGNOSIS — R600 Localized edema: Secondary | ICD-10-CM | POA: Diagnosis not present

## 2016-12-02 LAB — BRAIN NATRIURETIC PEPTIDE: BNP: 33.1 pg/mL (ref 0.0–100.0)

## 2016-12-05 ENCOUNTER — Encounter: Payer: Self-pay | Admitting: Family Medicine

## 2016-12-15 ENCOUNTER — Ambulatory Visit (HOSPITAL_COMMUNITY)
Admission: RE | Admit: 2016-12-15 | Discharge: 2016-12-15 | Disposition: A | Discharge status: Home / Self Care | Payer: Medicare Other | Source: Ambulatory Visit | Attending: Family Medicine | Admitting: Family Medicine

## 2016-12-15 ENCOUNTER — Ambulatory Visit (INDEPENDENT_AMBULATORY_CARE_PROVIDER_SITE_OTHER): Payer: Medicare Other | Admitting: Family Medicine

## 2016-12-15 ENCOUNTER — Encounter: Payer: Self-pay | Admitting: Family Medicine

## 2016-12-15 VITALS — BP 134/90 | Temp 98.6°F | Ht 71.0 in | Wt 291.0 lb

## 2016-12-15 DIAGNOSIS — M79605 Pain in left leg: Secondary | ICD-10-CM | POA: Diagnosis not present

## 2016-12-15 DIAGNOSIS — M7989 Other specified soft tissue disorders: Secondary | ICD-10-CM

## 2016-12-15 DIAGNOSIS — Z86718 Personal history of other venous thrombosis and embolism: Secondary | ICD-10-CM | POA: Insufficient documentation

## 2016-12-15 DIAGNOSIS — L03116 Cellulitis of left lower limb: Secondary | ICD-10-CM | POA: Diagnosis not present

## 2016-12-15 MED ORDER — TRIAMCINOLONE ACETONIDE 0.1 % EX CREA: 1.0000 "application " | TOPICAL_CREAM | Freq: Two times a day (BID) | CUTANEOUS | 0 refills | Status: DC

## 2016-12-15 MED ORDER — DOXYCYCLINE HYCLATE 100 MG PO TABS: 100.0000 mg | ORAL_TABLET | Freq: Two times a day (BID) | ORAL | 0 refills | Status: DC

## 2016-12-15 NOTE — Progress Notes (Signed)
   Subjective:    Patient ID: [name], male    DOB: 03/02/1951, 66 y.o.   MRN: [id number]  Leg Pain   Incident onset: 3 weeks ago. has been seen in office and twice at [facility]. The pain is present in the left leg and right leg. Treatments tried: fluid pill.   Patient present with swelling in both legs but left more than right. Next  Also notes pain in the left leg. Next  Has history of DVTs in the past.  Also history of substantial venous stasis see prior note.  Has developed redness and tenderness and leg. Has had history of skin infections before.    Also notes scaliness lateral ankle. Left side more than right. Very pruritic at times.  No headache no chest pain no shortness of breath  Review of Systems No headache, no major weight loss or weight gain, no chest pain no back pain abdominal pain no change in bowel habits complete ROS otherwise negative     Objective:   Physical Exam Alert vitals stable, NAD. Blood pressure good on repeat. HEENT normal. Lungs clear. Heart regular rate and rhythm. Legs bilateral edema left more so than right left anterior cath erythematous warm to touch diffusely tender throughout.  Lateral ankle scaly patchy rash     Assessment & Plan:  Impression probable superimposed cellulitis on venous stasis. Discussed with patient. #2 venous stasis dermatitis discussed plan antibiotics prescribed. Ultrasound left leg warning signs discussed intervention discussed. Follow-up with Dr. Nicki Reaper next week. WSL

## 2016-12-22 ENCOUNTER — Encounter: Payer: Self-pay | Admitting: Family Medicine

## 2016-12-22 ENCOUNTER — Ambulatory Visit (INDEPENDENT_AMBULATORY_CARE_PROVIDER_SITE_OTHER): Payer: Medicare Other | Admitting: Family Medicine

## 2016-12-22 VITALS — BP 132/84 | Ht 71.0 in | Wt 292.4 lb

## 2016-12-22 DIAGNOSIS — R600 Localized edema: Secondary | ICD-10-CM

## 2016-12-22 MED ORDER — TORSEMIDE 20 MG PO TABS: ORAL_TABLET | ORAL | 4 refills | Status: DC

## 2016-12-22 MED ORDER — POTASSIUM CHLORIDE CRYS ER 10 MEQ PO TBCR
20.0000 meq | EXTENDED_RELEASE_TABLET | Freq: Two times a day (BID) | ORAL | 1 refills | Status: DC
Start: 2016-12-22 — End: 2018-04-19

## 2016-12-22 NOTE — Progress Notes (Signed)
   Subjective:    Patient ID: [name], male    DOB: 1950-12-15, 66 y.o.   MRN: [id number]  HPI Patient arrives for a follow up on foot swelling. Has bilateral swelling in both legs. Has a terrible habit of sleeping in a recliner but it's mainly because his back and neck he will be seen the specialist coming up he denies any chest pressure tightness shortness of breath he is had a ultrasound which did not show DVTs also had bloodwork which did not show congestive heart failure he has been using Demadex in the morning it seems to be helping some he denies any other particular troubles.  Review of Systems    denies any chest tightness pressure pain shortness of breath does relate swelling in the legs no PND no orthopnea Objective:   Physical Exam  Lungs clear no crackles heart is regular extremities 1-2+ edema all the way up to the knee No sign of cellulitis     Assessment & Plan:  Pedal edema Compression stockings patient states he has a home he is to use ease Increased Demadex used to in the morning and 1 at noontime Double up on potassium Check metabolic 7 in 3 weeks Try to sleep in a bed with legs elevated as best as possible Follow-up within 6 weeks follow-up sooner if any problems

## 2016-12-23 ENCOUNTER — Other Ambulatory Visit: Payer: Self-pay | Admitting: Nurse Practitioner

## 2016-12-28 ENCOUNTER — Other Ambulatory Visit (HOSPITAL_BASED_OUTPATIENT_CLINIC_OR_DEPARTMENT_OTHER): Payer: Medicare Other

## 2016-12-28 ENCOUNTER — Telehealth: Payer: Self-pay | Admitting: Oncology

## 2016-12-28 ENCOUNTER — Ambulatory Visit (HOSPITAL_BASED_OUTPATIENT_CLINIC_OR_DEPARTMENT_OTHER): Payer: Medicare Other | Admitting: Oncology

## 2016-12-28 VITALS — BP 154/78 | HR 88 | Temp 97.4°F | Resp 18 | Ht 71.0 in | Wt 292.0 lb

## 2016-12-28 DIAGNOSIS — Z862 Personal history of diseases of the blood and blood-forming organs and certain disorders involving the immune mechanism: Secondary | ICD-10-CM

## 2016-12-28 DIAGNOSIS — D693 Immune thrombocytopenic purpura: Secondary | ICD-10-CM

## 2016-12-28 LAB — CBC WITH DIFFERENTIAL/PLATELET
BASO%: 0.2 % (ref 0.0–2.0)
BASOS ABS: 0 10*3/uL (ref 0.0–0.1)
EOS ABS: 0.2 10*3/uL (ref 0.0–0.5)
EOS%: 3 % (ref 0.0–7.0)
HCT: 36 % — ABNORMAL LOW (ref 38.4–49.9)
HGB: 11.7 g/dL — ABNORMAL LOW (ref 13.0–17.1)
LYMPH%: 18 % (ref 14.0–49.0)
MCH: 28.1 pg (ref 27.2–33.4)
MCHC: 32.5 g/dL (ref 32.0–36.0)
MCV: 86.5 fL (ref 79.3–98.0)
MONO#: 0.3 10*3/uL (ref 0.1–0.9)
MONO%: 5.8 % (ref 0.0–14.0)
NEUT#: 4.2 10*3/uL (ref 1.5–6.5)
NEUT%: 73 % (ref 39.0–75.0)
Platelets: 135 10*3/uL — ABNORMAL LOW (ref 140–400)
RBC: 4.16 10*6/uL — AB (ref 4.20–5.82)
RDW: 13.1 % (ref 11.0–14.6)
WBC: 5.7 10*3/uL (ref 4.0–10.3)
lymph#: 1 10*3/uL (ref 0.9–3.3)

## 2016-12-28 LAB — COMPREHENSIVE METABOLIC PANEL
ALT: 20 U/L (ref 0–55)
AST: 17 U/L (ref 5–34)
Albumin: 3.7 g/dL (ref 3.5–5.0)
Alkaline Phosphatase: 123 U/L (ref 40–150)
Anion Gap: 8 mEq/L (ref 3–11)
BUN: 14.8 mg/dL (ref 7.0–26.0)
CHLORIDE: 101 meq/L (ref 98–109)
CO2: 31 meq/L — AB (ref 22–29)
Calcium: 8.9 mg/dL (ref 8.4–10.4)
Creatinine: 0.9 mg/dL (ref 0.7–1.3)
EGFR: 88 mL/min/{1.73_m2} — ABNORMAL LOW (ref >90)
GLUCOSE: 163 mg/dL — AB (ref 70–140)
POTASSIUM: 3.8 meq/L (ref 3.5–5.1)
SODIUM: 140 meq/L (ref 136–145)
Total Bilirubin: 0.5 mg/dL (ref 0.20–1.20)
Total Protein: 6.4 g/dL (ref 6.4–8.3)

## 2016-12-28 NOTE — Progress Notes (Signed)
Hematology and Oncology Follow Up Visit  [name] [id number] 09-Aug-1951 66 y.o. 12/28/2016 10:43 AM [name], MDLuking, Elayne Snare, MD   Principle Diagnosis: 66 year old gentleman with thrombocytopenia likely ITP diagnosed in July 2016. He presented with platelets less than 5 petechiae and ecchymosis.  Past therapy: Prednisone 120 mg daily with complete response in August 2016. He was tapered off completely and September 2016.   Current therapy: Active surveillance.  Interim History: [name] presents today for a follow-up visit. Since his last visit, he reports no major changes in his health. He was seen in the emergency department for increased lower extremity edema and was given Lovenox injection and DVT was ruled out. At that time his platelet count was 131. He has been prescribed Demadex which have helped his edema. He denied any hematochezia, melena or epistaxis. He denied any recent hospitalizations.  He does not report any neurological deficits, headaches, blurry vision syncope or seizures. He does not report any fevers, chills, sweats or weight loss. He does not report any chest pain, orthopnea or leg edema. He does not report any cough, hemoptysis or hematemesis. Does not report any nausea, vomiting or abdominal pain. Does not report any frequency urgency or hesitancy. Does not report any skeletal complaints. Remaining review of systems unremarkable.  Medications: I have reviewed the patient's current medications.  Current Outpatient Prescriptions  Medication Sig Dispense Refill  . albuterol (PROVENTIL HFA;VENTOLIN HFA) 108 (90 Base) MCG/ACT inhaler Inhale 2 puffs into the lungs every 4 (four) hours as needed for wheezing or shortness of breath. 1 Inhaler 2  . atorvastatin (LIPITOR) 80 MG tablet TAKE ONE TABLET BY MOUTH ONCE DAILY. 90 tablet 0  . cetirizine (ZYRTEC) 10 MG tablet Take 10 mg by mouth at bedtime.      . citalopram (CELEXA) 20 MG tablet Take 1 tablet (20 mg total)  by mouth every morning. 90 tablet 1  . cyclobenzaprine (FLEXERIL) 5 MG tablet Take 5 mg by mouth 3 (three) times daily as needed for muscle spasms.     Marland Kitchen levothyroxine (SYNTHROID, LEVOTHROID) 112 MCG tablet TAKE 1 TABLET BY MOUTH DAILY BEFORE BREAKFAST. 90 tablet 1  . metoprolol (LOPRESSOR) 50 MG tablet Take 1 tablet (50 mg total) by mouth 2 (two) times daily. 180 tablet 1  . morphine (MS CONTIN) 60 MG 12 hr tablet Take 60 mg by mouth every 8 (eight) hours. Scheduled doses for pain    . morphine (MSIR) 15 MG tablet Take 15 mg by mouth every 6 (six) hours as needed for severe pain.    . Multiple Vitamin (MULTIVITAMIN) capsule Take 1 capsule by mouth daily.      . pantoprazole (PROTONIX) 40 MG tablet Take 1 tablet (40 mg total) by mouth daily. 90 tablet 1  . potassium chloride (K-DUR,KLOR-CON) 10 MEQ tablet Take 2 tablets (20 mEq total) by mouth 2 (two) times daily. 360 tablet 1  . tamsulosin (FLOMAX) 0.4 MG CAPS capsule Take 1 capsule (0.4 mg total) by mouth daily. 30 capsule 5  . torsemide (DEMADEX) 20 MG tablet 2 tablets qam and one tablet at noon 90 tablet 4  . triamcinolone cream (KENALOG) 0.1 % Apply 1 application topically 2 (two) times daily. 30 g 0  . nitroGLYCERIN (NITROSTAT) 0.4 MG SL tablet Place 1 tablet (0.4 mg total) under the tongue every 5 (five) minutes as needed for chest pain. (Patient not taking: Reported on 12/28/2016) 10 tablet 2   No current facility-administered medications for this visit.  Allergies:  Allergies  Allergen Reactions  . Penicillins Other (See Comments)    "I passed out last time I had it and they told me not to take it again."  . Sulfa Antibiotics Swelling  . Zoloft [Sertraline Hcl] Other (See Comments)    Confusion     Past Medical History, Surgical history, Social history, and Family History were reviewed and updated.   Physical Exam: His vitals reviewed today including a blood pressure 154/78. His pulse is 88 with respiration 18. His  temperature is 90.7.  ECOG: 0 General appearance: Well-appearing gentleman appeared without distress. Head: Normocephalic, without obvious abnormality no oral ulcers or lesions. Neck: no adenopathy Lymph nodes: Cervical, supraclavicular, and axillary nodes normal. Heart:regular rate and rhythm, S1, S2 normal, no murmur, click, rub or gallop Lung:chest clear, no wheezing, rales, normal symmetric air entry Abdomin: soft, non-tender, without masses or organomegaly no rebound or guarding. EXT: 1+ edema noted bilaterally. Skin: No ecchymosis or petechiae.  Lab Results: Lab Results  Component Value Date   WBC 5.7 12/28/2016   HGB 11.7 (L) 12/28/2016   HCT 36.0 (L) 12/28/2016   MCV 86.5 12/28/2016   PLT 135 (L) 12/28/2016     Chemistry      Component Value Date/Time   NA 138 11/19/2016 1956   NA 141 06/06/2016 0906   NA 140 10/02/2015 1259   K 4.3 11/19/2016 1956   K 4.2 10/02/2015 1259   CL 101 11/19/2016 1956   CO2 31 11/19/2016 1956   CO2 27 10/02/2015 1259   BUN 15 11/19/2016 1956   BUN 8 06/06/2016 0906   BUN 12.0 10/02/2015 1259   CREATININE 0.84 11/19/2016 1956   CREATININE 0.8 10/02/2015 1259      Component Value Date/Time   CALCIUM 8.8 (L) 11/19/2016 1956   CALCIUM 9.0 10/02/2015 1259   ALKPHOS 83 06/06/2016 0906   ALKPHOS 118 10/02/2015 1259   AST 15 06/06/2016 0906   AST 12 10/02/2015 1259   ALT 10 06/06/2016 0906   ALT 10 10/02/2015 1259   BILITOT 0.7 06/06/2016 0906   BILITOT 0.51 10/02/2015 1259      Impression and Plan:  66 year old gentleman with the following issues:  1. Thrombocytopenia due to ITP diagnosed in July 2016. He presented with a platelet count of 16,000 and had a complete response to prednisone with a normal platelet count at that time. His prednisone has been completely tapered off in September 2016.  His CBC was repeated today And pelvic cancer remains close to normal range of 135.  At this time, no further intervention is needed  and I recommended continued surveillance. I will repeat his count in 6 months. Repeat to steroids or IVIG will be needed if he develops relapse disease.   2. Follow-up: Will be in 6 months to recheck his platelet counts at that time.  [facility], MD 2/28/201810:43 AM

## 2016-12-28 NOTE — Telephone Encounter (Signed)
Gave patient avs report and appointments for August.  °

## 2017-01-02 ENCOUNTER — Telehealth: Payer: Self-pay | Admitting: Family Medicine

## 2017-01-02 DIAGNOSIS — R600 Localized edema: Secondary | ICD-10-CM

## 2017-01-02 MED ORDER — TORSEMIDE 20 MG PO TABS: ORAL_TABLET | ORAL | 4 refills | Status: DC

## 2017-01-02 NOTE — Telephone Encounter (Signed)
Spoke with patient and informed him per Dr.Scott [name]- we are going to increase a new dose 2 tablets int he morning and 2 tablets at noon. Continue potassium, check met 7 in 2-3 weeks. Keep follow up in office visit in April, try to keep legs propped when resting in possible sleep in bed and not in a recliner. Patient's verbalized understanding.

## 2017-01-02 NOTE — Telephone Encounter (Signed)
It would be fine to increase torsemide new dose 2 tablets in the morning 2 tablets at noon, continue potassium, check metabolic 7 in 2-3 weeks, keep follow-up office visit in April, try to keep legs propped up when resting in if possible sleep in a bed not a recliner

## 2017-01-02 NOTE — Telephone Encounter (Signed)
Patient was seen on 12/22/16 for pedal edema by Dr. Nicki Reaper.  He said he is still having trouble with swelling and was told to call back if continued issues.  Please advise.

## 2017-01-10 DIAGNOSIS — M961 Postlaminectomy syndrome, not elsewhere classified: Secondary | ICD-10-CM | POA: Diagnosis not present

## 2017-01-10 DIAGNOSIS — M62838 Other muscle spasm: Secondary | ICD-10-CM | POA: Diagnosis not present

## 2017-01-10 DIAGNOSIS — M4726 Other spondylosis with radiculopathy, lumbar region: Secondary | ICD-10-CM | POA: Diagnosis not present

## 2017-01-10 DIAGNOSIS — M5412 Radiculopathy, cervical region: Secondary | ICD-10-CM | POA: Diagnosis not present

## 2017-01-12 DIAGNOSIS — R600 Localized edema: Secondary | ICD-10-CM | POA: Diagnosis not present

## 2017-01-13 ENCOUNTER — Encounter: Payer: Self-pay | Admitting: Family Medicine

## 2017-01-13 LAB — BASIC METABOLIC PANEL
BUN/Creatinine Ratio: 16 (ref 10–24)
BUN: 11 mg/dL (ref 8–27)
CO2: 29 mmol/L (ref 18–29)
Calcium: 8.9 mg/dL (ref 8.6–10.2)
Chloride: 102 mmol/L (ref 96–106)
Creatinine, Ser: 0.67 mg/dL — ABNORMAL LOW (ref 0.76–1.27)
GFR calc Af Amer: 117 mL/min/{1.73_m2} (ref >59)
GFR calc non Af Amer: 101 mL/min/{1.73_m2} (ref >59)
Glucose: 119 mg/dL — ABNORMAL HIGH (ref 65–99)
Potassium: 4.8 mmol/L (ref 3.5–5.2)
Sodium: 145 mmol/L — ABNORMAL HIGH (ref 134–144)

## 2017-01-13 NOTE — Telephone Encounter (Signed)
It is possible that glucose been elevated could be a sign of developing diabetes. Also if the blood was drawn in the middle of the day often it is not fasting. Therefore I recommend a fasting glucose and a hemoglobin A1c because of hyperglycemia please help set this up for the patient

## 2017-01-18 ENCOUNTER — Other Ambulatory Visit: Payer: Self-pay | Admitting: Nurse Practitioner

## 2017-02-03 ENCOUNTER — Encounter: Payer: Self-pay | Admitting: Family Medicine

## 2017-02-03 ENCOUNTER — Ambulatory Visit (INDEPENDENT_AMBULATORY_CARE_PROVIDER_SITE_OTHER): Payer: Medicare Other | Admitting: Family Medicine

## 2017-02-03 VITALS — BP 124/80 | Ht 71.0 in | Wt 294.0 lb

## 2017-02-03 DIAGNOSIS — M542 Cervicalgia: Secondary | ICD-10-CM | POA: Diagnosis not present

## 2017-02-03 DIAGNOSIS — R600 Localized edema: Secondary | ICD-10-CM

## 2017-02-03 DIAGNOSIS — R7301 Impaired fasting glucose: Secondary | ICD-10-CM | POA: Diagnosis not present

## 2017-02-03 LAB — POCT GLYCOSYLATED HEMOGLOBIN (HGB A1C): Hemoglobin A1C: 5.2

## 2017-02-03 NOTE — Progress Notes (Signed)
   Subjective:    Patient ID: [name], male    DOB: [birth year], 66 y.o.   MRN: [id number]  HPIFollow up on bilateral leg swelling. Breathing no no no PND no orthopnea swelling is stable. Patient states it's better than what it has been.  Wants to have xray for neck pain. Pain started 2 months ago. Patient pain discomfort in the neck radiates into the arms denies sweats chills fevers denies injury has previous surgery. Patient also has fasting hyperglycemia on every single lab test he is concerned about possibility diabetes therefore hemoglobin A1c was running in the results was reviewed with patient the results were normal. The patient was talked to at length about the importance cutting back on starch and other sugars in his diet.   Review of Systems  Constitutional: Negative for activity change, fatigue and fever.  Respiratory: Negative for cough and shortness of breath.   Cardiovascular: Negative for chest pain and leg swelling.  Neurological: Negative for headaches.       Objective:   Physical Exam  Constitutional: He appears well-nourished. No distress.  Cardiovascular: Normal rate, regular rhythm and normal heart sounds.   No murmur heard. Pulmonary/Chest: Effort normal and breath sounds normal. No respiratory distress.  Musculoskeletal: He exhibits edema.  Lymphadenopathy:    He has no cervical adenopathy.  Neurological: He is alert.  Psychiatric: His behavior is normal.  Vitals reviewed.   25 minutes was spent with the patient. Greater than half the time was spent in discussion and answering questions and counseling regarding the issues that the patient came in for today.   He does have some swelling but it's not severe1+-2+    Assessment & Plan:  Pedal edema stable taking 2 in the morning 2 in the afternoon I encouraged him to watch diet closely we will check lab work again later this summer right before his visit in September  Heart disease stable follows up  with specialist  Significant neck pain radiating into both arms with subjective weakness and both arms previous surgery in the neck x-ray ordered await the results may need referral to neurosurgery may need MRI

## 2017-02-20 ENCOUNTER — Ambulatory Visit: Payer: Medicare Other | Admitting: Family Medicine

## 2017-03-03 ENCOUNTER — Encounter: Payer: Self-pay | Admitting: Family Medicine

## 2017-03-03 ENCOUNTER — Ambulatory Visit (INDEPENDENT_AMBULATORY_CARE_PROVIDER_SITE_OTHER): Payer: Medicare Other | Admitting: Family Medicine

## 2017-03-03 VITALS — BP 128/80 | Temp 98.2°F | Ht 71.0 in | Wt 298.0 lb

## 2017-03-03 DIAGNOSIS — J019 Acute sinusitis, unspecified: Secondary | ICD-10-CM

## 2017-03-03 MED ORDER — CEFPROZIL 500 MG PO TABS: 500.0000 mg | ORAL_TABLET | Freq: Two times a day (BID) | ORAL | 0 refills | Status: DC

## 2017-03-03 NOTE — Progress Notes (Signed)
   Subjective:    Patient ID: [name], male    DOB: [birth year], 66 y.o.   MRN: [id number]  Sinusitis  This is a new problem. Episode onset: 3 days. Associated symptoms include coughing, ear pain, headaches and a sore throat. (Fever, wheezing) Past treatments include acetaminophen.  Patient relates that he picked up an illness from his wife easy relates a lot of cough congestion drainage some rattling in the chest no difficulty breathing relates a lot of sinus pressure pain a small amount wheezing    Review of Systems  HENT: Positive for ear pain and sore throat.   Respiratory: Positive for cough.   Neurological: Positive for headaches.       Objective:   Physical Exam  Minimal expiratory wheezes no crackles no pneumonia detected heart regular not respiratory distress eardrums normal throat normal      Assessment & Plan:  Viral syndrome Secondary rhinosinusitis Cefzil 10 days Warning signs discussed follow-up if problems

## 2017-03-14 DIAGNOSIS — M62838 Other muscle spasm: Secondary | ICD-10-CM | POA: Diagnosis not present

## 2017-03-14 DIAGNOSIS — M4726 Other spondylosis with radiculopathy, lumbar region: Secondary | ICD-10-CM | POA: Diagnosis not present

## 2017-03-14 DIAGNOSIS — M5412 Radiculopathy, cervical region: Secondary | ICD-10-CM | POA: Diagnosis not present

## 2017-03-14 DIAGNOSIS — M961 Postlaminectomy syndrome, not elsewhere classified: Secondary | ICD-10-CM | POA: Diagnosis not present

## 2017-03-30 ENCOUNTER — Other Ambulatory Visit: Payer: Self-pay | Admitting: Family Medicine

## 2017-04-24 ENCOUNTER — Other Ambulatory Visit: Payer: Self-pay | Admitting: Nurse Practitioner

## 2017-04-28 ENCOUNTER — Emergency Department (HOSPITAL_COMMUNITY)
Admission: EM | Admit: 2017-04-28 | Discharge: 2017-04-28 | Disposition: A | Discharge status: Home / Self Care | Payer: Medicare Other | Attending: Emergency Medicine | Admitting: Emergency Medicine

## 2017-04-28 ENCOUNTER — Encounter (HOSPITAL_COMMUNITY): Payer: Self-pay | Admitting: *Deleted

## 2017-04-28 ENCOUNTER — Emergency Department (HOSPITAL_COMMUNITY): Payer: Medicare Other

## 2017-04-28 DIAGNOSIS — E039 Hypothyroidism, unspecified: Secondary | ICD-10-CM | POA: Diagnosis not present

## 2017-04-28 DIAGNOSIS — Z955 Presence of coronary angioplasty implant and graft: Secondary | ICD-10-CM | POA: Insufficient documentation

## 2017-04-28 DIAGNOSIS — Z87891 Personal history of nicotine dependence: Secondary | ICD-10-CM | POA: Diagnosis not present

## 2017-04-28 DIAGNOSIS — Z79899 Other long term (current) drug therapy: Secondary | ICD-10-CM | POA: Diagnosis not present

## 2017-04-28 DIAGNOSIS — R600 Localized edema: Secondary | ICD-10-CM | POA: Diagnosis not present

## 2017-04-28 DIAGNOSIS — I2510 Atherosclerotic heart disease of native coronary artery without angina pectoris: Secondary | ICD-10-CM | POA: Insufficient documentation

## 2017-04-28 DIAGNOSIS — J449 Chronic obstructive pulmonary disease, unspecified: Secondary | ICD-10-CM | POA: Diagnosis not present

## 2017-04-28 DIAGNOSIS — R609 Edema, unspecified: Secondary | ICD-10-CM

## 2017-04-28 DIAGNOSIS — I10 Essential (primary) hypertension: Secondary | ICD-10-CM | POA: Insufficient documentation

## 2017-04-28 DIAGNOSIS — R0600 Dyspnea, unspecified: Secondary | ICD-10-CM | POA: Diagnosis not present

## 2017-04-28 DIAGNOSIS — I252 Old myocardial infarction: Secondary | ICD-10-CM | POA: Diagnosis not present

## 2017-04-28 LAB — COMPREHENSIVE METABOLIC PANEL
ALBUMIN: 3.6 g/dL (ref 3.5–5.0)
ALT: 16 U/L — AB (ref 17–63)
AST: 20 U/L (ref 15–41)
Alkaline Phosphatase: 105 U/L (ref 38–126)
Anion gap: 8 (ref 5–15)
BUN: 10 mg/dL (ref 6–20)
CHLORIDE: 101 mmol/L (ref 101–111)
CO2: 29 mmol/L (ref 22–32)
CREATININE: 0.92 mg/dL (ref 0.61–1.24)
Calcium: 8.7 mg/dL — ABNORMAL LOW (ref 8.9–10.3)
GFR calc Af Amer: >60 mL/min (ref >60)
GFR calc non Af Amer: >60 mL/min (ref >60)
GLUCOSE: 162 mg/dL — AB (ref 65–99)
Potassium: 3.1 mmol/L — ABNORMAL LOW (ref 3.5–5.1)
SODIUM: 138 mmol/L (ref 135–145)
Total Bilirubin: 0.6 mg/dL (ref 0.3–1.2)
Total Protein: 6.2 g/dL — ABNORMAL LOW (ref 6.5–8.1)

## 2017-04-28 LAB — CBC WITH DIFFERENTIAL/PLATELET
Basophils Absolute: 0 10*3/uL (ref 0.0–0.1)
Basophils Relative: 0 %
EOS ABS: 0.2 10*3/uL (ref 0.0–0.7)
EOS PCT: 3 %
HCT: 35.2 % — ABNORMAL LOW (ref 39.0–52.0)
Hemoglobin: 11.4 g/dL — ABNORMAL LOW (ref 13.0–17.0)
LYMPHS ABS: 0.9 10*3/uL (ref 0.7–4.0)
LYMPHS PCT: 16 %
MCH: 27.7 pg (ref 26.0–34.0)
MCHC: 32.4 g/dL (ref 30.0–36.0)
MCV: 85.4 fL (ref 78.0–100.0)
MONO ABS: 0.3 10*3/uL (ref 0.1–1.0)
MONOS PCT: 6 %
Neutro Abs: 4.4 10*3/uL (ref 1.7–7.7)
Neutrophils Relative %: 75 %
Platelets: 122 10*3/uL — ABNORMAL LOW (ref 150–400)
RBC: 4.12 MIL/uL — AB (ref 4.22–5.81)
RDW: 14.5 % (ref 11.5–15.5)
WBC: 5.9 10*3/uL (ref 4.0–10.5)

## 2017-04-28 LAB — BRAIN NATRIURETIC PEPTIDE: B Natriuretic Peptide: 81.9 pg/mL (ref 0.0–100.0)

## 2017-04-28 MED ORDER — POTASSIUM CHLORIDE CRYS ER 20 MEQ PO TBCR
20.0000 meq | EXTENDED_RELEASE_TABLET | Freq: Once | ORAL | Status: AC
  Administered 2017-04-28: 20 meq via ORAL
  Filled 2017-04-28: qty 1

## 2017-04-28 NOTE — ED Notes (Signed)
ED Provider at bedside. 

## 2017-04-28 NOTE — ED Triage Notes (Signed)
Pt in c/o bil lower leg swelling chronicallly, pt seen cardiologist x 3 wks ago & states, "They just keep going up on my diuretics." pt has x2 open wounds to L lower leg, pt states, "they blistered up and then they busted." pt denies current SOB, denies CP, A&O x4

## 2017-04-28 NOTE — ED Notes (Signed)
Pt provided with diet coke, ok by PA

## 2017-04-28 NOTE — Discharge Instructions (Signed)
Read the information below.  You may return to the Emergency Department at any time for worsening condition or any new symptoms that concern you.   Please use compression stockings or wrap your legs tightly with ACE wraps, being careful not to cut of circulation.  Elevate your legs as much as possible.   Follow up with Dr Wolfgang Phoenix to discuss your Torsemide dosage.    If you develop redness, swelling, pus draining from the wound, or fevers greater than 100.4, return to the ER immediately for a recheck.

## 2017-04-28 NOTE — ED Notes (Signed)
Patient transported to X-ray 

## 2017-04-28 NOTE — ED Provider Notes (Signed)
[facility] DEPT Provider Note   CSN: [id number] Arrival date & time: 04/28/17  0845     History   Chief Complaint Chief Complaint  Patient presents with  . Leg Swelling    HPI MAXIMILIAN TALLO is a 66 y.o. male.  HPI   Pt with hx CAD s/p MI, multiple stents, LE edema, chronic back and neck pain p/w concern about his persistent bilateral lower extremity edema.  It has been present approximately 3 months, continued worsening despite increase in diuretics (Torsemide 40mg  BID).  Has had two blisters pop on the left anterior shin and redness around the area began 1 week ago.  Could not tolerate compression stockings.   Also notes dyspnea on exertion that also began 3 months ago.  Sleeps in a recliner due to chronic back and neck pain, unable to comment on orthopnea.  Denies CP, cough, fevers, leg injury, weakness.    Has seen PCP for same, has had [place] to r/o DVTs (January, February 2018).   Echo 2013 EF 77-82%, grade 1 diastolic dysfunction  Past Medical History:  Diagnosis Date  . Allergy   . Carpal tunnel syndrome   . Chronic back pain   . Coronary artery disease   . Degenerative disc disease   . GERD (gastroesophageal reflux disease)   . History of thrombocytopenia   . Hypercholesteremia   . Hypertension   . Hypothyroid   . Lumbar pain   . Myocardial infarction ([place]) 2009  . Shortness of breath dyspnea     Patient Active Problem List   Diagnosis Date Noted  . Pedal edema 11/28/2016  . Osteopenia 08/13/2015  . Thrombocytopenia ([place]) 05/25/2015  . ITP (idiopathic thrombocytopenic purpura) 05/25/2015  . Hypothyroidism 08/29/2014  . Diverticulitis of colon (without mention of hemorrhage)(562.11) 02/04/2014  . Dysphagia, unspecified(787.20) 02/04/2014  . Hyperlipidemia 11/18/2013  . Dyspnea 01/02/2012  . Palpitations 01/02/2012  . CAD (coronary artery disease) 07/06/2011  . Mixed hyperlipidemia 07/06/2011  . TOTAL KNEE FOLLOW-UP 08/14/2008  . BACK PAIN 07/23/2008  .  KNEE, ARTHRITIS, DEGEN./OSTEO 05/29/2008  . JOINT EFFUSION, KNEE 04/30/2008  . Pain in joint, lower leg 10/10/2007  . Essential hypertension 10/09/2007    Past Surgical History:  Procedure Laterality Date  . BACK SURGERY     5 lumbas disc with cervical and lumbar fusions  . BIOPSY N/A 03/06/2014   Procedure: ESOPHAGEAL BIOPSIES;  Surgeon: [name], MD;  Location: [facility];  Service: Endoscopy;  Laterality: N/A;  . COLONOSCOPY    . COLONOSCOPY WITH PROPOFOL N/A 03/06/2014   Procedure: COLONOSCOPY WITH PROPOFOL;  Surgeon: [name], MD;  Location: [facility];  Service: Endoscopy;  Laterality: N/A;  in cecum at 0807; total withdrawal time 9 minutes  . CORONARY ANGIOPLASTY WITH STENT PLACEMENT     2000, and 2004 has 3 stents  . ESOPHAGOGASTRODUODENOSCOPY (EGD) WITH PROPOFOL N/A 03/06/2014   Procedure: ESOPHAGOGASTRODUODENOSCOPY (EGD) WITH PROPOFOL;  Surgeon: [name], MD;  Location: [facility];  Service: Endoscopy;  Laterality: N/A;  . LUMBAR FUSION  2009  . MALONEY DILATION N/A 03/06/2014   Procedure: MALONEY DILATION 54 french;  Surgeon: [name], MD;  Location: [facility];  Service: Endoscopy;  Laterality: N/A;  . NECK SURGERY         Home Medications    Prior to Admission medications   Medication Sig Start Date End Date Taking? Authorizing Provider  albuterol (PROVENTIL HFA;VENTOLIN HFA) 108 (90 Base) MCG/ACT inhaler Inhale 2 puffs into the lungs  every 4 (four) hours as needed for wheezing or shortness of breath. 05/30/16   Nilda Simmer, NP  atorvastatin (LIPITOR) 80 MG tablet TAKE ONE TABLET BY MOUTH ONCE DAILY. 03/30/17   Kathyrn Drown, MD  cefPROZIL (CEFZIL) 500 MG tablet Take 1 tablet (500 mg total) by mouth 2 (two) times daily. 03/03/17   Kathyrn Drown, MD  cetirizine (ZYRTEC) 10 MG tablet Take 10 mg by mouth at bedtime.      [provider]  citalopram (CELEXA) 20 MG tablet TAKE 1 TABLET BY MOUTH EVERY MORNING. 04/24/17   Nilda Simmer, NP    cyclobenzaprine (FLEXERIL) 5 MG tablet Take 5 mg by mouth 3 (three) times daily as needed for muscle spasms.  11/15/16   [provider]  doxazosin (CARDURA) 4 MG tablet TAKE 1 TABLET BY MOUTH EVERY MORNING. 04/24/17   Nilda Simmer, NP  levothyroxine (SYNTHROID, LEVOTHROID) 112 MCG tablet TAKE 1 TABLET BY MOUTH DAILY BEFORE BREAKFAST. 05/30/16   Nilda Simmer, NP  metoprolol (LOPRESSOR) 50 MG tablet Take 1 tablet (50 mg total) by mouth 2 (two) times daily. 05/30/16   Nilda Simmer, NP  morphine (MS CONTIN) 60 MG 12 hr tablet Take 60 mg by mouth every 8 (eight) hours. Scheduled doses for pain    [provider]  morphine (MSIR) 15 MG tablet Take 15 mg by mouth every 6 (six) hours as needed for severe pain.    [provider]  Multiple Vitamin (MULTIVITAMIN) capsule Take 1 capsule by mouth daily.      [provider]  nitroGLYCERIN (NITROSTAT) 0.4 MG SL tablet Place 1 tablet (0.4 mg total) under the tongue every 5 (five) minutes as needed for chest pain. 05/30/16   Nilda Simmer, NP  pantoprazole (PROTONIX) 40 MG tablet TAKE 1 TABLET BY MOUTH ONCE A DAY. 04/24/17   Nilda Simmer, NP  potassium chloride (K-DUR,KLOR-CON) 10 MEQ tablet Take 2 tablets (20 mEq total) by mouth 2 (two) times daily. 12/22/16   Kathyrn Drown, MD  tamsulosin (FLOMAX) 0.4 MG CAPS capsule Take 1 capsule (0.4 mg total) by mouth daily. 11/28/16   Kathyrn Drown, MD  torsemide (DEMADEX) 20 MG tablet Take 2 tablets by mouth in the morning and two tablets by mouth at noon. 01/02/17   Kathyrn Drown, MD  triamcinolone cream (KENALOG) 0.1 % Apply 1 application topically 2 (two) times daily. 12/15/16   Mikey Kirschner, MD    Family History Family History  Problem Relation Age of Onset  . Heart attack Father     Social History Social History  Substance Use Topics  . Smoking status: Former Smoker    Packs/day: 2.00    Years: 20.00    Types: Cigarettes    Quit date:  07/06/1991  . Smokeless tobacco: Never Used  . Alcohol use No     Comment: occassional     Allergies   Penicillins; Sulfa antibiotics; and Zoloft [sertraline hcl]   Review of Systems Review of Systems  All other systems reviewed and are negative.    Physical Exam Updated Vital Signs BP (!) 162/92   Pulse 77   Temp 98.4 F (36.9 C) (Oral)   Resp (!) 24   Ht 5\' 11"  (1.803 m)   SpO2 97%   Physical Exam  Constitutional: He appears well-developed and well-nourished. No distress.  HENT:  Head: Normocephalic and atraumatic.  Neck: Neck supple.  Cardiovascular: Normal rate, regular rhythm and  intact distal pulses.   Pulmonary/Chest: Effort normal and breath sounds normal. No respiratory distress. He has no wheezes. He has no rales.  Abdominal: Soft. He exhibits no distension and no mass. There is no tenderness. There is no rebound and no guarding.  obese  Musculoskeletal: He exhibits edema.  Bilateral lower extremity edema to the knee.  Left shin with two small breaks in skin without discharge.  Skin is erythematous over distal left leg but not tender to palpation.    Neurological: He is alert. He exhibits normal muscle tone.  Skin: He is not diaphoretic.  Nursing note and vitals reviewed.    ED Treatments / Results  Labs (all labs ordered are listed, but only abnormal results are displayed) Labs Reviewed  COMPREHENSIVE METABOLIC PANEL - Abnormal; Notable for the following:       Result Value   Potassium 3.1 (*)    Glucose, Bld 162 (*)    Calcium 8.7 (*)    Total Protein 6.2 (*)    ALT 16 (*)    All other components within normal limits  CBC WITH DIFFERENTIAL/PLATELET - Abnormal; Notable for the following:    RBC 4.12 (*)    Hemoglobin 11.4 (*)    HCT 35.2 (*)    Platelets 122 (*)    All other components within normal limits  BRAIN NATRIURETIC PEPTIDE    EKG  EKG Interpretation  Date/Time:  Friday April 28 2017 10:28:33 EDT Ventricular Rate:  76 PR  Interval:    QRS Duration: 114 QT Interval:  411 QTC Calculation: 463 R Axis:   41 Text Interpretation:  Sinus rhythm.  Multiform ventricular premature complexes Probable anteroseptal infarct, old No STEMI. Similar to prior.  Confirmed by Nanda Quinton [phone]) on 04/28/2017 10:31:21 AM Also confirmed by Nanda Quinton [phone]), editor Drema Pry [phone])  on 04/28/2017 10:51:28 AM       Radiology Dg Chest 2 View  Result Date: 04/28/2017 CLINICAL DATA:  Dyspnea on exertion EXAM: CHEST  2 VIEW COMPARISON:  05/25/2015 FINDINGS: Cardiac shadow is mildly enlarged but stable. Coronary stents are seen. Azygos lobe is noted. The lungs are clear but hyperinflated. Changes consistent with prior cervical spine surgery. No acute bony abnormality is noted. IMPRESSION: COPD without acute abnormality. Electronically Signed   By: Inez Catalina M.D.   On: 04/28/2017 11:09    Procedures Procedures (including critical care time)  Medications Ordered in ED Medications  potassium chloride SA (K-DUR,KLOR-CON) CR tablet 20 mEq (20 mEq Oral Given 04/28/17 1245)     Initial Impression / Assessment and Plan / ED Course  I have reviewed the triage vital signs and the nursing notes.  Pertinent labs & imaging results that were available during my care of the patient were reviewed by me and considered in my medical decision making (see chart for details).     Afebrile, nontoxic patient with chronic bilateral lower extremity edema.  Mild DOE.  CXR clear, BNP normal.  Pt sleeps upright in a recliner and does not wear compression hose - we discussed strategies for dealing with peripheral edema including elevation and compression. Pt to also talk with PCP about home dose of torsemide.  I offered pt IV lasix here but he declined.  Doubt ACS, acute CHF.  Doubt infection of open areas of left leg.  Suspect chronic venous stasis changes.  Has already been ruled out for DVT x 2.  Low suspicion for this.  Labs  unremarkable, largely unchanged from baseline, mild hypokalemia.  Pt discussed and also seen by Dr Laverta Baltimore.  Wounds dressed and bilateral legs wrapped with ace bandages for compression by tech.  Discussed home care with patient and his significant other.  D/C home with PCP follow up.  Discussed result, findings, treatment, and follow up  with patient.  Pt given return precautions.  Pt verbalizes understanding and agrees with plan.       Final Clinical Impressions(s) / ED Diagnoses   Final diagnoses:  Peripheral edema    New Prescriptions Discharge Medication List as of 04/28/2017 12:20 PM       Clayton Bibles, Hershal Coria 04/28/17 1445    Long, Wonda Olds, MD 04/28/17 2024

## 2017-05-01 ENCOUNTER — Other Ambulatory Visit: Payer: Self-pay | Admitting: Family Medicine

## 2017-05-11 DIAGNOSIS — M62838 Other muscle spasm: Secondary | ICD-10-CM | POA: Diagnosis not present

## 2017-05-11 DIAGNOSIS — M4726 Other spondylosis with radiculopathy, lumbar region: Secondary | ICD-10-CM | POA: Diagnosis not present

## 2017-05-11 DIAGNOSIS — M5412 Radiculopathy, cervical region: Secondary | ICD-10-CM | POA: Diagnosis not present

## 2017-05-11 DIAGNOSIS — M961 Postlaminectomy syndrome, not elsewhere classified: Secondary | ICD-10-CM | POA: Diagnosis not present

## 2017-06-02 NOTE — Progress Notes (Signed)
Cardiology Office Note   Date:  06/05/2017   ID:  SI JACHIM, DOB Dec 03, 1950, MRN [id number]  PCP:  Kathyrn Drown, MD  Cardiologist:   Jenkins Rouge, MD   No chief complaint on file.     History of Present Illness: [name] is a 66 y.o. male who presents for f/u CAD   Has some issues with anxiety and chronic pain. Some dyspnea and recent weight gain on steriods for ITP.  Takes lasix for LE edema  History  Of HTN and elevated lipids on statin .   Distant history of CAD  Stents in circumflex/right and LAD  Last cath Dr Lia Foyer 2012.  With restenting of OM branch patent LAD Stent and diffuse RCA disease not focal.  Initial stents placed by Dr Lyndel Safe in 2003 Did not comply/fu for myovue ordered Last year   Cannot walk on treadmill due to left knee pain. Last myovue said he felt horrible and had a reaction but was just intense dyspnea And some chest pain. From adenosine   Lab Results  Component Value Date   LDLCALC 156 (H) 06/06/2016    Seen in ER 04/28/17 for LE edema ongoing last month PCP with negative duplex LE veins  Reviewed ER notes/labs BNP normal CXR no CHF  Offered iv lasix but declined D/c for primary t adjust diuretic dosing  Seen by Sallee Lange but demedex not changes  Has multiple cervical and lumbar fusions Needs new neurosurgeon as Dr Joya Salm has retired     Past Medical History:  Diagnosis Date  . Allergy   . Carpal tunnel syndrome   . Chronic back pain   . Coronary artery disease   . Degenerative disc disease   . GERD (gastroesophageal reflux disease)   . History of thrombocytopenia   . Hypercholesteremia   . Hypertension   . Hypothyroid   . Lumbar pain   . Myocardial infarction ([place]) 2009  . Shortness of breath dyspnea     Past Surgical History:  Procedure Laterality Date  . BACK SURGERY     5 lumbas disc with cervical and lumbar fusions  . BIOPSY N/A 03/06/2014   Procedure: ESOPHAGEAL BIOPSIES;  Surgeon: [name], MD;   Location: [facility];  Service: Endoscopy;  Laterality: N/A;  . COLONOSCOPY    . COLONOSCOPY WITH PROPOFOL N/A 03/06/2014   Procedure: COLONOSCOPY WITH PROPOFOL;  Surgeon: [name], MD;  Location: [facility];  Service: Endoscopy;  Laterality: N/A;  in cecum at 0807; total withdrawal time 9 minutes  . CORONARY ANGIOPLASTY WITH STENT PLACEMENT     2000, and 2004 has 3 stents  . ESOPHAGOGASTRODUODENOSCOPY (EGD) WITH PROPOFOL N/A 03/06/2014   Procedure: ESOPHAGOGASTRODUODENOSCOPY (EGD) WITH PROPOFOL;  Surgeon: [name], MD;  Location: [facility];  Service: Endoscopy;  Laterality: N/A;  . LUMBAR FUSION  2009  . MALONEY DILATION N/A 03/06/2014   Procedure: MALONEY DILATION 54 french;  Surgeon: [name], MD;  Location: [facility];  Service: Endoscopy;  Laterality: N/A;  . NECK SURGERY       Current Outpatient Prescriptions  Medication Sig Dispense Refill  . albuterol (PROVENTIL HFA;VENTOLIN HFA) 108 (90 Base) MCG/ACT inhaler Inhale 2 puffs into the lungs every 4 (four) hours as needed for wheezing or shortness of breath. 1 Inhaler 2  . atorvastatin (LIPITOR) 80 MG tablet TAKE ONE TABLET BY MOUTH ONCE DAILY. 90 tablet 0  . cetirizine (ZYRTEC) 10 MG tablet Take 10 mg by  mouth at bedtime.      . citalopram (CELEXA) 20 MG tablet TAKE 1 TABLET BY MOUTH EVERY MORNING. 90 tablet 1  . cyclobenzaprine (FLEXERIL) 5 MG tablet Take 5 mg by mouth 3 (three) times daily as needed for muscle spasms.     Marland Kitchen doxazosin (CARDURA) 4 MG tablet TAKE 1 TABLET BY MOUTH EVERY MORNING. 90 tablet 1  . levothyroxine (SYNTHROID, LEVOTHROID) 112 MCG tablet TAKE 1 TABLET BY MOUTH DAILY BEFORE BREAKFAST. 90 tablet 1  . metoprolol (LOPRESSOR) 50 MG tablet Take 1 tablet (50 mg total) by mouth 2 (two) times daily. 180 tablet 1  . morphine (MS CONTIN) 60 MG 12 hr tablet Take 60 mg by mouth every 8 (eight) hours. Scheduled doses for pain    . morphine (MSIR) 15 MG tablet Take 15 mg by mouth every 6 (six) hours as needed for severe pain.     . Multiple Vitamin (MULTIVITAMIN) capsule Take 1 capsule by mouth daily.      . nitroGLYCERIN (NITROSTAT) 0.4 MG SL tablet Place 1 tablet (0.4 mg total) under the tongue every 5 (five) minutes as needed for chest pain. 10 tablet 2  . pantoprazole (PROTONIX) 40 MG tablet TAKE 1 TABLET BY MOUTH ONCE A DAY. 90 tablet 1  . potassium chloride (K-DUR,KLOR-CON) 10 MEQ tablet Take 2 tablets (20 mEq total) by mouth 2 (two) times daily. 360 tablet 1  . tamsulosin (FLOMAX) 0.4 MG CAPS capsule TAKE 1 CAPSULE BY MOUTH DAILY. 30 capsule 0  . torsemide (DEMADEX) 20 MG tablet Take 2 tablets by mouth in the morning and two tablets by mouth at noon. 120 tablet 4  . triamcinolone cream (KENALOG) 0.1 % Apply 1 application topically 2 (two) times daily. 30 g 0   No current facility-administered medications for this visit.     Allergies:   Penicillins; Sulfa antibiotics; and Zoloft [sertraline hcl]    Social History:  The patient  reports that he quit smoking about 25 years ago. His smoking use included Cigarettes. He has a 40.00 pack-year smoking history. He has never used smokeless tobacco. He reports that he does not drink alcohol or use drugs.   Family History:  The patient's family history includes Heart attack in his father.    ROS:  Please see the history of present illness.   Otherwise, review of systems are positive for none.   All other systems are reviewed and negative.    PHYSICAL EXAM: VS:  BP (!) 142/80   Pulse 80   Ht 5\' 11"  (1.803 m)   Wt 293 lb 6.4 oz (133.1 kg)   SpO2 97% Comment: on room air  BMI 40.92 kg/m  , BMI Body mass index is 40.92 kg/m. Affect appropriate Healthy:  appears stated age 66: normal Neck supple with no adenopathy JVP normal no bruits no thyromegaly Lungs clear with no wheezing and good diaphragmatic motion Heart:  S1/S2 no murmur, no rub, gallop or click PMI normal Abdomen: benighn, BS positve, no tenderness, no AAA no bruit.  No HSM or HJR Distal  pulses intact with no bruits Plus 2 bilateral LE edema with stasis worse in LLE with TKR Neuro non-focal Skin warm and dry No muscular weakness    EKG:  03/03/14  SR normal ECG  06/28/16  SR rate 86  T wave inversions 3, F 06/02/17 NSR normal ECG rate 79   Recent Labs: 06/06/2016: TSH 4.410 04/28/2017: ALT 16; B Natriuretic Peptide 81.9; BUN 10; Creatinine, Ser 0.92; Hemoglobin  11.4; Platelets 122; Potassium 3.1; Sodium 138    Lipid Panel    Component Value Date/Time   CHOL 213 (H) 06/06/2016 0906   TRIG 71 06/06/2016 0906   HDL 43 06/06/2016 0906   CHOLHDL 5.0 06/06/2016 0906   CHOLHDL 5.0 07/05/2013 0850   VLDL 24 07/05/2013 0850   LDLCALC 156 (H) 06/06/2016 0906      Wt Readings from Last 3 Encounters:  06/05/17 293 lb 6.4 oz (133.1 kg)  03/03/17 298 lb (135.2 kg)  02/03/17 294 lb (133.4 kg)      Other studies Reviewed: Additional studies/ records that were reviewed today include: Cath notes notes primary Dr Wolfgang Phoenix .    ASSESSMENT AND PLAN:  1.  CAD:  Multiple previous stents  Stable no chest pain continue medical Rx  2. Dyspnea: previously normal EF f/u Echo for RV/LV function BNP CXR normal  3. ITP on steroids will be an issue if he has recurrent CAD and needs stenting  4. Chol continue high dose lipitor consider adding zetia 5. HTN;  Well controlled.  Continue current medications and low sodium Dash type diet.   6. Edema with venous stasis f/u wound center and primary demedex 40 bid Needs something else can consider zaroxyln 5 mg M/W/Friday avoid salt with pop corn 7. Neuro:  Chronic pain management Gave him Driscilla Moats name as alternative neurosurgeion   Current medicines are reviewed at length with the patient today.  The patient does not have concerns regarding medicines.  The following changes have been made:  no change  Labs/ tests ordered today include: Echo   Orders Placed This Encounter  Procedures  . ECHOCARDIOGRAM COMPLETE     Disposition:   FU  with [facility] a year     Signed, Jenkins Rouge, MD  06/05/2017 10:29 AM    [facility] [place], [place], [place]  [address] Phone: [phone]; Fax: [phone]

## 2017-06-05 ENCOUNTER — Encounter: Payer: Self-pay | Admitting: Cardiovascular Disease

## 2017-06-05 ENCOUNTER — Ambulatory Visit (INDEPENDENT_AMBULATORY_CARE_PROVIDER_SITE_OTHER): Payer: Medicare Other | Admitting: Cardiovascular Disease

## 2017-06-05 VITALS — BP 142/80 | HR 80 | Ht 71.0 in | Wt 293.4 lb

## 2017-06-05 DIAGNOSIS — R601 Generalized edema: Secondary | ICD-10-CM

## 2017-06-05 NOTE — Patient Instructions (Signed)
Medication Instructions:  Your physician recommends that you continue on your current medications as directed. Please refer to the Current Medication list given to you today.   Labwork: NONE  Testing/Procedures: Your physician has requested that you have an echocardiogram. Echocardiography is a painless test that uses sound waves to create images of your heart. It provides your doctor with information about the size and shape of your heart and how well your heart's chambers and valves are working. This procedure takes approximately one hour. There are no restrictions for this procedure.    Follow-Up: Your physician wants you to follow-up in: 1 Year with Dr Johnsie Cancel.  You will receive a reminder letter in the mail two months in advance. If you don't receive a letter, please call our office to schedule the follow-up appointment.   Any Other Special Instructions Will Be Listed Below (If Applicable).     If you need a refill on your cardiac medications before your next appointment, please call your pharmacy.  Thank you for choosing [facility]!

## 2017-06-12 ENCOUNTER — Ambulatory Visit (HOSPITAL_COMMUNITY)
Admission: RE | Admit: 2017-06-12 | Discharge: 2017-06-12 | Disposition: A | Discharge status: Home / Self Care | Payer: Medicare Other | Source: Ambulatory Visit | Attending: Cardiovascular Disease | Admitting: Cardiovascular Disease

## 2017-06-12 DIAGNOSIS — I083 Combined rheumatic disorders of mitral, aortic and tricuspid valves: Secondary | ICD-10-CM | POA: Diagnosis not present

## 2017-06-12 DIAGNOSIS — E782 Mixed hyperlipidemia: Secondary | ICD-10-CM | POA: Insufficient documentation

## 2017-06-12 DIAGNOSIS — R601 Generalized edema: Secondary | ICD-10-CM

## 2017-06-12 DIAGNOSIS — I2510 Atherosclerotic heart disease of native coronary artery without angina pectoris: Secondary | ICD-10-CM | POA: Insufficient documentation

## 2017-06-12 DIAGNOSIS — I371 Nonrheumatic pulmonary valve insufficiency: Secondary | ICD-10-CM | POA: Diagnosis not present

## 2017-06-12 DIAGNOSIS — I10 Essential (primary) hypertension: Secondary | ICD-10-CM | POA: Insufficient documentation

## 2017-06-12 LAB — ECHOCARDIOGRAM COMPLETE
CHL CUP DOP CALC LVOT VTI: 26.1 cm
CHL CUP STROKE VOLUME: 74 mL
EERAT: 11.47
EWDT: 208 ms
FS: 33 % (ref 28–44)
IV/PV OW: 1.03
LA ID, A-P, ES: 38 mm
LA diam end sys: 38 mm
LADIAMINDEX: 1.44 cm/m2
LAVOL: 91.7 mL
LAVOLA4C: 81.9 mL
LAVOLIN: 34.7 mL/m2
LV E/e' medial: 11.47
LV PW d: 11.8 mm — AB (ref 0.6–1.1)
LV TDI E'LATERAL: 7.83
LV e' LATERAL: 7.83 cm/s
LV sys vol index: 16 mL/m2
LV sys vol: 41 mL (ref 21–61)
LVDIAVOL: 115 mL (ref 62–150)
LVDIAVOLIN: 44 mL/m2
LVEEAVG: 11.47
LVOT area: 3.8 cm2
LVOT peak grad rest: 5 mmHg
LVOT peak vel: 112 cm/s
LVOTD: 22 mm
LVOTSV: 99 mL
MV Dec: 208
MV pk E vel: 89.8 m/s
MVPG: 3 mmHg
MVPKAVEL: 77.8 m/s
Simpson's disk: 64
TAPSE: 28.7 mm
TDI e' medial: 7.18

## 2017-06-12 NOTE — Progress Notes (Signed)
*  PRELIMINARY RESULTS* Echocardiogram 2D Echocardiogram has been performed.  [name] 06/12/2017, 2:02 PM

## 2017-06-13 ENCOUNTER — Telehealth: Payer: Self-pay

## 2017-06-13 NOTE — Telephone Encounter (Signed)
-----   Message from Josue Hector, MD sent at 06/12/2017  5:07 PM EDT ----- Echo with normal EF no bad valves good

## 2017-06-13 NOTE — Telephone Encounter (Signed)
Called pt, no answer- left message for pt to return call.  

## 2017-06-15 ENCOUNTER — Other Ambulatory Visit: Payer: Self-pay | Admitting: Nurse Practitioner

## 2017-06-20 ENCOUNTER — Ambulatory Visit: Payer: Medicare Other | Admitting: Oncology

## 2017-06-20 ENCOUNTER — Other Ambulatory Visit: Payer: Medicare Other

## 2017-06-30 ENCOUNTER — Other Ambulatory Visit: Payer: Self-pay | Admitting: Family Medicine

## 2017-06-30 ENCOUNTER — Encounter: Payer: Self-pay | Admitting: Nurse Practitioner

## 2017-06-30 ENCOUNTER — Ambulatory Visit (INDEPENDENT_AMBULATORY_CARE_PROVIDER_SITE_OTHER): Payer: Medicare Other | Admitting: Nurse Practitioner

## 2017-06-30 VITALS — BP 112/70 | Ht 71.0 in | Wt 290.0 lb

## 2017-06-30 DIAGNOSIS — Z23 Encounter for immunization: Secondary | ICD-10-CM

## 2017-06-30 DIAGNOSIS — I10 Essential (primary) hypertension: Secondary | ICD-10-CM | POA: Diagnosis not present

## 2017-06-30 DIAGNOSIS — K5732 Diverticulitis of large intestine without perforation or abscess without bleeding: Secondary | ICD-10-CM | POA: Diagnosis not present

## 2017-06-30 DIAGNOSIS — R609 Edema, unspecified: Secondary | ICD-10-CM | POA: Diagnosis not present

## 2017-06-30 MED ORDER — METRONIDAZOLE 500 MG PO TABS: 500.0000 mg | ORAL_TABLET | Freq: Three times a day (TID) | ORAL | 0 refills | Status: DC

## 2017-06-30 MED ORDER — CIPROFLOXACIN HCL 500 MG PO TABS: 500.0000 mg | ORAL_TABLET | Freq: Two times a day (BID) | ORAL | 0 refills | Status: DC

## 2017-06-30 MED ORDER — TAMSULOSIN HCL 0.4 MG PO CAPS: 0.4000 mg | ORAL_CAPSULE | Freq: Every day | ORAL | 5 refills | Status: DC

## 2017-06-30 NOTE — Patient Instructions (Signed)
Demadex increase to 3 pills twice a day; call back end of next week if no significant improvement in edema.

## 2017-07-01 ENCOUNTER — Encounter: Payer: Self-pay | Admitting: Nurse Practitioner

## 2017-07-01 NOTE — Progress Notes (Signed)
Subjective:  Presents for recheck on his blood pressure and peripheral edema. Had a recent complete cardiac workup including a cardiac echo. Our office is managing his medication for edema. No unusual shortness of breath. No cough or orthopnea. Has decreased salt in his diet. No chest pain/ischemic type pain. Has also had a flareup of his left lower quadrant abdominal pain. States he feels warm at times but no high fevers. No obvious blood in his stool. His last episode of diverticulitis was 2015. States his symptoms are very similar to those in the past. Is not having loose bowel movements but describes them as "sticky". Taking fluids well. Voiding normal limit.  Objective:   BP 112/70   Ht 5\' 11"  (1.803 m)   Wt 290 lb 0.4 oz (131.6 kg)   BMI 40.45 kg/m  NAD. Alert, oriented. Lungs clear. Heart regular rate rhythm. Abdomen soft nondistended with left lower quadrant abdominal tenderness specifically towards the outer side near the sigmoid colon. Some guarding with deep palpation. No obvious masses. Both lower extremities have dry skin and mild erythema from chronic peripheral edema with 1-2 plus pitting edema noted.  Assessment:   Problem List Items Addressed This Visit      Cardiovascular and Mediastinum   Essential hypertension - Primary     Other   Diverticulitis of colon (without mention of hemorrhage)(562.11)   Peripheral edema    Other Visit Diagnoses    Need for vaccination       Relevant Orders   Flu Vaccine QUAD 36+ mos IM (Completed)       Plan:   Meds ordered this encounter  Medications  . ciprofloxacin (CIPRO) 500 MG tablet    Sig: Take 1 tablet (500 mg total) by mouth 2 (two) times daily.    Dispense:  14 tablet    Refill:  0    Order Specific Question:   Supervising Provider    Answer:   Mikey Kirschner [[id number]]  . metroNIDAZOLE (FLAGYL) 500 MG tablet    Sig: Take 1 tablet (500 mg total) by mouth 3 (three) times daily.    Dispense:  21 tablet    Refill:  0   Order Specific Question:   Supervising Provider    Answer:   Mikey Kirschner [[id number]]   Increase Demadex to 3 by mouth twice a day. Callback in 7-10 days if no improvement in fluid status. Since patient is not demonstrating a need for emergent care and because of his history of diverticulitis and current symptoms, will treat presumptively for possible diverticulitis. Warning signs reviewed for fluid and diverticulitis, call back or go to ED if worse. Otherwise routine follow-up. Return in about 3 months (around 09/29/2017) for recheck.

## 2017-07-04 ENCOUNTER — Other Ambulatory Visit: Payer: Self-pay | Admitting: Family Medicine

## 2017-07-05 MED ORDER — ATORVASTATIN CALCIUM 80 MG PO TABS: 80.0000 mg | ORAL_TABLET | Freq: Every day | ORAL | 0 refills | Status: DC

## 2017-07-06 DIAGNOSIS — M62838 Other muscle spasm: Secondary | ICD-10-CM | POA: Diagnosis not present

## 2017-07-06 DIAGNOSIS — M5412 Radiculopathy, cervical region: Secondary | ICD-10-CM | POA: Diagnosis not present

## 2017-07-06 DIAGNOSIS — M961 Postlaminectomy syndrome, not elsewhere classified: Secondary | ICD-10-CM | POA: Diagnosis not present

## 2017-07-06 DIAGNOSIS — M4726 Other spondylosis with radiculopathy, lumbar region: Secondary | ICD-10-CM | POA: Diagnosis not present

## 2017-07-24 ENCOUNTER — Other Ambulatory Visit: Payer: Self-pay | Admitting: Nurse Practitioner

## 2017-07-24 ENCOUNTER — Other Ambulatory Visit: Payer: Self-pay | Admitting: Family Medicine

## 2017-07-24 NOTE — Telephone Encounter (Signed)
Each one of these +5 refills

## 2017-07-24 NOTE — Telephone Encounter (Signed)
Last seen 06/30/2017

## 2017-08-09 DIAGNOSIS — M5412 Radiculopathy, cervical region: Secondary | ICD-10-CM | POA: Diagnosis not present

## 2017-08-09 DIAGNOSIS — M4726 Other spondylosis with radiculopathy, lumbar region: Secondary | ICD-10-CM | POA: Diagnosis not present

## 2017-08-09 DIAGNOSIS — M62838 Other muscle spasm: Secondary | ICD-10-CM | POA: Diagnosis not present

## 2017-08-09 DIAGNOSIS — M961 Postlaminectomy syndrome, not elsewhere classified: Secondary | ICD-10-CM | POA: Diagnosis not present

## 2017-08-17 ENCOUNTER — Other Ambulatory Visit: Payer: Self-pay | Admitting: Nurse Practitioner

## 2017-08-17 MED ORDER — METOPROLOL TARTRATE 50 MG PO TABS: 50.0000 mg | ORAL_TABLET | Freq: Two times a day (BID) | ORAL | 1 refills | Status: DC

## 2017-09-06 DIAGNOSIS — M5412 Radiculopathy, cervical region: Secondary | ICD-10-CM | POA: Diagnosis not present

## 2017-09-06 DIAGNOSIS — M62838 Other muscle spasm: Secondary | ICD-10-CM | POA: Diagnosis not present

## 2017-09-06 DIAGNOSIS — Z79891 Long term (current) use of opiate analgesic: Secondary | ICD-10-CM | POA: Diagnosis not present

## 2017-09-06 DIAGNOSIS — M5417 Radiculopathy, lumbosacral region: Secondary | ICD-10-CM | POA: Diagnosis not present

## 2017-09-06 DIAGNOSIS — M961 Postlaminectomy syndrome, not elsewhere classified: Secondary | ICD-10-CM | POA: Diagnosis not present

## 2017-09-06 DIAGNOSIS — M4726 Other spondylosis with radiculopathy, lumbar region: Secondary | ICD-10-CM | POA: Diagnosis not present

## 2017-09-25 ENCOUNTER — Ambulatory Visit: Payer: Medicare Other | Admitting: Nurse Practitioner

## 2017-11-02 DIAGNOSIS — M4726 Other spondylosis with radiculopathy, lumbar region: Secondary | ICD-10-CM | POA: Diagnosis not present

## 2017-11-02 DIAGNOSIS — M62838 Other muscle spasm: Secondary | ICD-10-CM | POA: Diagnosis not present

## 2017-11-02 DIAGNOSIS — M5412 Radiculopathy, cervical region: Secondary | ICD-10-CM | POA: Diagnosis not present

## 2017-11-02 DIAGNOSIS — M961 Postlaminectomy syndrome, not elsewhere classified: Secondary | ICD-10-CM | POA: Diagnosis not present

## 2017-11-13 ENCOUNTER — Other Ambulatory Visit: Payer: Self-pay | Admitting: Family Medicine

## 2017-11-13 MED ORDER — ATORVASTATIN CALCIUM 80 MG PO TABS: 80.0000 mg | ORAL_TABLET | Freq: Every day | ORAL | 0 refills | Status: DC

## 2017-12-11 ENCOUNTER — Other Ambulatory Visit: Payer: Self-pay | Admitting: Family Medicine

## 2017-12-13 NOTE — Telephone Encounter (Signed)
Patient needs office visit and lab work may have 1 refill of 90 tablets

## 2017-12-14 ENCOUNTER — Encounter: Payer: Self-pay | Admitting: Family Medicine

## 2017-12-14 ENCOUNTER — Ambulatory Visit: Payer: Medicare Other | Admitting: Family Medicine

## 2017-12-14 VITALS — BP 168/92 | Ht 71.0 in | Wt 295.0 lb

## 2017-12-14 DIAGNOSIS — E038 Other specified hypothyroidism: Secondary | ICD-10-CM

## 2017-12-14 DIAGNOSIS — Z125 Encounter for screening for malignant neoplasm of prostate: Secondary | ICD-10-CM

## 2017-12-14 DIAGNOSIS — D693 Immune thrombocytopenic purpura: Secondary | ICD-10-CM | POA: Diagnosis not present

## 2017-12-14 DIAGNOSIS — E7849 Other hyperlipidemia: Secondary | ICD-10-CM | POA: Diagnosis not present

## 2017-12-14 DIAGNOSIS — I10 Essential (primary) hypertension: Secondary | ICD-10-CM | POA: Diagnosis not present

## 2017-12-14 DIAGNOSIS — L03211 Cellulitis of face: Secondary | ICD-10-CM | POA: Diagnosis not present

## 2017-12-14 DIAGNOSIS — Z79899 Other long term (current) drug therapy: Secondary | ICD-10-CM

## 2017-12-14 MED ORDER — CLINDAMYCIN HCL 300 MG PO CAPS: 300.0000 mg | ORAL_CAPSULE | Freq: Three times a day (TID) | ORAL | 0 refills | Status: DC

## 2017-12-14 NOTE — Progress Notes (Signed)
   Subjective:    Patient ID: [name], male    DOB: 1950-12-19, 67 y.o.   MRN: [id number]  HPI Patient is here today with complaints of a sinus infection. He states he has had a cough,wheezing,under eye swelling,runny nose,bilateral ear pain,headache,fever for the last four days. He has been taking tylenol which has helped some. He relates some swelling of the right side of his face some pain and discomfort he has dental issues denies wheezing difficulty breathing denies severe headache denies high fevers Review of Systems  Constitutional: Negative for activity change, chills and fever.  HENT: Positive for congestion and rhinorrhea. Negative for ear pain.   Eyes: Negative for discharge.  Respiratory: Positive for cough. Negative for wheezing.   Cardiovascular: Negative for chest pain.  Gastrointestinal: Negative for nausea and vomiting.  Musculoskeletal: Negative for arthralgias.       Objective:   Physical Exam  Constitutional: He appears well-developed.  HENT:  Head: Normocephalic and atraumatic.  Mouth/Throat: Oropharynx is clear and moist. No oropharyngeal exudate.  Eyes: Right eye exhibits no discharge. Left eye exhibits no discharge.  Neck: Normal range of motion.  Cardiovascular: Normal rate, regular rhythm and normal heart sounds.  No murmur heard. Pulmonary/Chest: Effort normal and breath sounds normal. No respiratory distress. He has no wheezes. He has no rales.  Lymphadenopathy:    He has no cervical adenopathy.  Neurological: He exhibits normal muscle tone.  Skin: Skin is warm and dry.  Nursing note and vitals reviewed.         Assessment & Plan:  Facial cellulitis probably related to his dental issues clindamycin 3 times daily 10 days follow-up if progressive troubles warning signs were discussed in detail  Upper respiratory mild sinus infection should get better with medication Comprehensive lab work and follow-up office visit in the spring

## 2018-01-01 DIAGNOSIS — M4726 Other spondylosis with radiculopathy, lumbar region: Secondary | ICD-10-CM | POA: Diagnosis not present

## 2018-01-01 DIAGNOSIS — M62838 Other muscle spasm: Secondary | ICD-10-CM | POA: Diagnosis not present

## 2018-01-01 DIAGNOSIS — M961 Postlaminectomy syndrome, not elsewhere classified: Secondary | ICD-10-CM | POA: Diagnosis not present

## 2018-01-01 DIAGNOSIS — M5412 Radiculopathy, cervical region: Secondary | ICD-10-CM | POA: Diagnosis not present

## 2018-01-10 ENCOUNTER — Ambulatory Visit: Payer: Medicare Other | Admitting: Family Medicine

## 2018-01-10 ENCOUNTER — Encounter: Payer: Self-pay | Admitting: Family Medicine

## 2018-01-10 VITALS — BP 162/88 | Temp 99.6°F | Ht 71.0 in | Wt 294.0 lb

## 2018-01-10 DIAGNOSIS — J019 Acute sinusitis, unspecified: Secondary | ICD-10-CM | POA: Diagnosis not present

## 2018-01-10 DIAGNOSIS — J4521 Mild intermittent asthma with (acute) exacerbation: Secondary | ICD-10-CM

## 2018-01-10 MED ORDER — DOXYCYCLINE HYCLATE 100 MG PO TABS: 100.0000 mg | ORAL_TABLET | Freq: Two times a day (BID) | ORAL | 0 refills | Status: DC

## 2018-01-10 NOTE — Progress Notes (Signed)
   Subjective:    Patient ID: [name], male    DOB: 1951/09/23, 67 y.o.   MRN: [id number]  HPI Patient is here today with complaints of a fever,cough and congestion,wheezing,runny nose,right ear pain,headache,sore throat,fever.On going for a month now.      Was seen by Dr Nicki Reaper for the same thing a few weeks ago and he was given clindamycin, States his grand children were diganosed with pneumonia.  See prior notes.  Did get better from this illness nearly a month ago.  Now will return over the past week.  Major exposures to viruses within the family.  Cough is productive of yellowish phlegm.  Also notes substantial wheezing.  Unfortunately has not uses inhaler   Review of Systems No headache, no major weight loss or weight gain, no chest pain no back pain abdominal pain no change in bowel habits complete ROS otherwise negative     Objective:   Physical Exam Alert active no acute distress somewhat chronic ill appearing HEENT mild nasal congestion pharynx normal lungs inspiration clear expiration positive very faint wheezes no tachypnea       Assessment & Plan:  Impression reemergence of respiratory illness with probable sinus and bronchial highly doubt pneumonia discussed Doxy 10 days resume albuterol regularly expect gradual

## 2018-02-13 ENCOUNTER — Encounter: Payer: Self-pay | Admitting: Family Medicine

## 2018-02-13 ENCOUNTER — Ambulatory Visit: Payer: Medicare Other | Admitting: Family Medicine

## 2018-02-13 ENCOUNTER — Telehealth: Payer: Self-pay | Admitting: Family Medicine

## 2018-02-13 ENCOUNTER — Other Ambulatory Visit (HOSPITAL_COMMUNITY)
Admission: RE | Admit: 2018-02-13 | Discharge: 2018-02-13 | Disposition: A | Discharge status: Home / Self Care | Payer: Medicare Other | Source: Ambulatory Visit | Attending: Family Medicine | Admitting: Family Medicine

## 2018-02-13 VITALS — BP 142/94 | Temp 98.6°F | Ht 71.0 in | Wt 288.2 lb

## 2018-02-13 DIAGNOSIS — D696 Thrombocytopenia, unspecified: Secondary | ICD-10-CM | POA: Insufficient documentation

## 2018-02-13 LAB — CBC WITH DIFFERENTIAL/PLATELET
Basophils Absolute: 0 10*3/uL (ref 0.0–0.1)
Basophils Relative: 1 %
Eosinophils Absolute: 0.1 10*3/uL (ref 0.0–0.7)
Eosinophils Relative: 2 %
HEMATOCRIT: 41.6 % (ref 39.0–52.0)
HEMOGLOBIN: 13.2 g/dL (ref 13.0–17.0)
LYMPHS ABS: 1 10*3/uL (ref 0.7–4.0)
LYMPHS PCT: 17 %
MCH: 29.3 pg (ref 26.0–34.0)
MCHC: 31.7 g/dL (ref 30.0–36.0)
MCV: 92.4 fL (ref 78.0–100.0)
Monocytes Absolute: 0.5 10*3/uL (ref 0.1–1.0)
Monocytes Relative: 8 %
NEUTROS PCT: 72 %
Neutro Abs: 4.2 10*3/uL (ref 1.7–7.7)
Platelets: 137 10*3/uL — ABNORMAL LOW (ref 150–400)
RBC: 4.5 MIL/uL (ref 4.22–5.81)
RDW: 14 % (ref 11.5–15.5)
WBC: 5.8 10*3/uL (ref 4.0–10.5)

## 2018-02-13 NOTE — Progress Notes (Signed)
   Subjective:    Patient ID: [name], male    DOB: 12-26-1950, 67 y.o.   MRN: [id number]   Rash   This is a new problem. The current episode started in the past 7 days. The affected locations include the left lower leg and right lower leg.  Pertinent negatives include no congestion, cough, diarrhea, fatigue, fever, rhinorrhea or shortness of breath.  He saw areas on his leg that he was concerned were petechiae he wanted them to be checked Platelets dropped in the past to very low level he does not want to get into this trouble he denies any bleeding issues denies any fever chills sweats wheezing difficulty breathing chest tightness pressure pain  Review of Systems  Constitutional: Negative for activity change, fatigue and fever.  HENT: Negative for congestion and rhinorrhea.   Respiratory: Negative for cough and shortness of breath.   Cardiovascular: Negative for chest pain and leg swelling.  Gastrointestinal: Negative for abdominal pain, diarrhea and nausea.  Genitourinary: Negative for dysuria and hematuria.  Skin: Positive for rash.  Neurological: Negative for weakness and headaches.  Psychiatric/Behavioral: Negative for behavioral problems.       Objective:   Physical Exam  Constitutional: He appears well-nourished. No distress.  HENT:  Head: Normocephalic and atraumatic.  Eyes: Right eye exhibits no discharge. Left eye exhibits no discharge.  Neck: No tracheal deviation present.  Cardiovascular: Normal rate, regular rhythm and normal heart sounds.  No murmur heard. Pulmonary/Chest: Effort normal and breath sounds normal. No respiratory distress. He has no wheezes.  Musculoskeletal: He exhibits no edema.  Lymphadenopathy:    He has no cervical adenopathy.  Neurological: He is alert.  Skin: Skin is warm. No rash noted.  Psychiatric: His behavior is normal.  Vitals reviewed.    he does have some small hemangiomas but I do not find any evidence of petechiae like we  did several years ago when he came there with this     Assessment & Plan:  Thrombocytopenia check stat lab work await the results may need to speak with his hematologist  Lab test came back showing that the platelets were within a reasonable range they are low but this is where he runs there is no need to put him in with hematology I did discuss the case with his wife and she is understanding of this as well as the patient he will follow-up if any problems  He will do preventive health labs later by June with a wellness visit

## 2018-02-13 NOTE — Telephone Encounter (Signed)
Pt states the rash he gets when his platelets are low has come up on his legs  Would like [place] to order lab work to check his platelets.    Please advise & call pt

## 2018-02-13 NOTE — Telephone Encounter (Signed)
Dr Nicki Reaper would like to see patient today for evaluation - Patient verbalized understanding and scheduled office visit this am for evaluation.

## 2018-02-21 ENCOUNTER — Other Ambulatory Visit: Payer: Self-pay | Admitting: Nurse Practitioner

## 2018-02-23 DIAGNOSIS — M62838 Other muscle spasm: Secondary | ICD-10-CM | POA: Diagnosis not present

## 2018-02-23 DIAGNOSIS — M961 Postlaminectomy syndrome, not elsewhere classified: Secondary | ICD-10-CM | POA: Diagnosis not present

## 2018-02-23 DIAGNOSIS — M4726 Other spondylosis with radiculopathy, lumbar region: Secondary | ICD-10-CM | POA: Diagnosis not present

## 2018-02-23 DIAGNOSIS — M5412 Radiculopathy, cervical region: Secondary | ICD-10-CM | POA: Diagnosis not present

## 2018-03-01 ENCOUNTER — Encounter: Payer: Medicare Other | Admitting: Family Medicine

## 2018-03-05 ENCOUNTER — Encounter: Payer: Self-pay | Admitting: Family Medicine

## 2018-03-05 ENCOUNTER — Ambulatory Visit (HOSPITAL_COMMUNITY)
Admission: RE | Admit: 2018-03-05 | Discharge: 2018-03-05 | Disposition: A | Discharge status: Home / Self Care | Payer: Medicare Other | Source: Ambulatory Visit | Attending: Family Medicine | Admitting: Family Medicine

## 2018-03-05 ENCOUNTER — Ambulatory Visit: Payer: Medicare Other | Admitting: Family Medicine

## 2018-03-05 VITALS — BP 100/62 | Ht 71.0 in | Wt 278.2 lb

## 2018-03-05 DIAGNOSIS — M5134 Other intervertebral disc degeneration, thoracic region: Secondary | ICD-10-CM | POA: Diagnosis not present

## 2018-03-05 DIAGNOSIS — M546 Pain in thoracic spine: Secondary | ICD-10-CM | POA: Diagnosis not present

## 2018-03-05 DIAGNOSIS — S299XXA Unspecified injury of thorax, initial encounter: Secondary | ICD-10-CM | POA: Diagnosis not present

## 2018-03-05 MED ORDER — PREDNISONE 20 MG PO TABS: ORAL_TABLET | ORAL | 0 refills | Status: DC

## 2018-03-05 NOTE — Progress Notes (Signed)
   Subjective:    Patient ID: [name], male    DOB: Oct 18, 1951, 67 y.o.   MRN: [id number]  HPI  Patient arrives for back pain issues-Fell on vacation. Severe mid back pain he relates that he fell injured his back and his knee but before that he was having severe mid back pain because of a movement he did triggered severe pain but the fall made it worse he now relates pain in the mid back he relates pain that radiates to the right side he states he is unable to get any relief unless he stands or sits a certain way he is unable to lay down he states the pain is so severe it takes his breath.  No hemoptysis no chest tightness pressure no abdominal pain vomiting or diarrhea does have extensive history of low back problems and previous surgeries Review of Systems Please see above.  No fever chills sweats   No chest tightness pressure does relate back pain radiates to the right side Objective:   Physical Exam Neck no masses lungs clear respiratory rate normal heart is regular no murmurs he did is not in any respiratory distress does have mid back pain to percussion radiates to the right side hurts with rotation extension and bending       Assessment & Plan:  Significant mid back pain and discomfort this is consistent with the possibility of a injury to the ligament versus a possibility of herniated disc I recommend that the patient have an x-ray more than likely will need to proceed to an MRI if insurance company will not approve the MRI will need to do the physical therapy plus referral to his back specialist possibly his back specialist would do myelogram if ongoing troubles  Patient is already under pain control with Dr. Hardin Negus in [place]

## 2018-03-06 NOTE — Addendum Note (Signed)
Addended by: Karle Barr on: 03/06/2018 02:18 PM   Modules accepted: Orders

## 2018-03-08 ENCOUNTER — Encounter: Payer: Medicare Other | Admitting: Family Medicine

## 2018-03-13 ENCOUNTER — Ambulatory Visit (HOSPITAL_COMMUNITY)
Admission: RE | Admit: 2018-03-13 | Discharge: 2018-03-13 | Disposition: A | Discharge status: Home / Self Care | Payer: Medicare Other | Source: Ambulatory Visit | Attending: Family Medicine | Admitting: Family Medicine

## 2018-03-13 DIAGNOSIS — M5136 Other intervertebral disc degeneration, lumbar region: Secondary | ICD-10-CM | POA: Diagnosis not present

## 2018-03-13 DIAGNOSIS — M1288 Other specific arthropathies, not elsewhere classified, other specified site: Secondary | ICD-10-CM | POA: Diagnosis not present

## 2018-03-13 DIAGNOSIS — M546 Pain in thoracic spine: Secondary | ICD-10-CM | POA: Diagnosis present

## 2018-03-13 DIAGNOSIS — M47814 Spondylosis without myelopathy or radiculopathy, thoracic region: Secondary | ICD-10-CM | POA: Diagnosis not present

## 2018-03-13 DIAGNOSIS — M48061 Spinal stenosis, lumbar region without neurogenic claudication: Secondary | ICD-10-CM | POA: Insufficient documentation

## 2018-03-13 DIAGNOSIS — M5134 Other intervertebral disc degeneration, thoracic region: Secondary | ICD-10-CM | POA: Diagnosis not present

## 2018-03-13 DIAGNOSIS — M8938 Hypertrophy of bone, other site: Secondary | ICD-10-CM | POA: Diagnosis not present

## 2018-03-14 ENCOUNTER — Telehealth: Payer: Self-pay | Admitting: *Deleted

## 2018-03-14 NOTE — Telephone Encounter (Signed)
Prednisone was ordered please see other result note- if patient still has back pain despite this then we will get him in with specialist, try second round first before referral

## 2018-03-14 NOTE — Telephone Encounter (Signed)
See result note on patient. He would like a refill on prednisone.  [facility].

## 2018-03-15 ENCOUNTER — Other Ambulatory Visit: Payer: Self-pay | Admitting: *Deleted

## 2018-03-15 MED ORDER — PREDNISONE 20 MG PO TABS: ORAL_TABLET | ORAL | 0 refills | Status: DC

## 2018-03-15 NOTE — Telephone Encounter (Signed)
Discussed with pt. Pt verbalized understanding.  °

## 2018-03-23 ENCOUNTER — Other Ambulatory Visit: Payer: Self-pay

## 2018-03-23 ENCOUNTER — Telehealth: Payer: Self-pay | Admitting: *Deleted

## 2018-03-23 DIAGNOSIS — M25469 Effusion, unspecified knee: Secondary | ICD-10-CM

## 2018-03-23 DIAGNOSIS — M542 Cervicalgia: Secondary | ICD-10-CM

## 2018-03-23 MED ORDER — GABAPENTIN 100 MG PO CAPS: ORAL_CAPSULE | ORAL | 2 refills | Status: DC

## 2018-03-23 NOTE — Telephone Encounter (Signed)
He is aware referral made.

## 2018-03-23 NOTE — Telephone Encounter (Signed)
Patient called stating the last prescription that Dr Wolfgang Phoenix put him on to try is helping, patient requested a refill. Unsure the name of the medication, patient states it was the last one that was prescribed. Please advise

## 2018-03-23 NOTE — Telephone Encounter (Signed)
Per the pt the prednisone is not helping, he would like to go ahead a with the referral for [facility]. Please advise.

## 2018-03-23 NOTE — Telephone Encounter (Signed)
Please go ahead with referral to [facility].  Also I am somewhat confused by this message regarding does the patient feel like he needs a refill.  If prednisone has not helped continued refills of prednisone would not give additional relief.  Sometimes low-dose gabapentin can help if he is interested

## 2018-03-23 NOTE — Addendum Note (Signed)
Addended by: Karle Barr on: 03/23/2018 02:14 PM   Modules accepted: Orders

## 2018-03-23 NOTE — Telephone Encounter (Signed)
Patient states he did not want any refills on prednisone,but the low dose gabapentin he would try. He would like this sent to [facility].

## 2018-03-23 NOTE — Telephone Encounter (Signed)
Gabapentin 100 mg 1 3 times daily, #90, patient should start off with 1 in the evening for the next 4 days then 1 twice daily for the following 4 days then 1-3 times a day-if medication causes excessive drowsiness or problems let us know, may have 2 refills

## 2018-03-23 NOTE — Addendum Note (Signed)
Addended by: Karle Barr on: 03/23/2018 02:12 PM   Modules accepted: Orders

## 2018-04-04 ENCOUNTER — Encounter: Payer: Self-pay | Admitting: Family Medicine

## 2018-04-04 ENCOUNTER — Encounter (INDEPENDENT_AMBULATORY_CARE_PROVIDER_SITE_OTHER): Payer: Self-pay

## 2018-04-17 ENCOUNTER — Encounter (HOSPITAL_COMMUNITY): Payer: Self-pay | Admitting: *Deleted

## 2018-04-17 ENCOUNTER — Emergency Department (HOSPITAL_COMMUNITY)
Admission: EM | Admit: 2018-04-17 | Discharge: 2018-04-17 | Disposition: A | Discharge status: Home / Self Care | Payer: Medicare Other | Attending: Emergency Medicine | Admitting: Emergency Medicine

## 2018-04-17 ENCOUNTER — Emergency Department (HOSPITAL_COMMUNITY): Payer: Medicare Other

## 2018-04-17 ENCOUNTER — Other Ambulatory Visit: Payer: Self-pay

## 2018-04-17 DIAGNOSIS — R0602 Shortness of breath: Secondary | ICD-10-CM | POA: Diagnosis not present

## 2018-04-17 DIAGNOSIS — E039 Hypothyroidism, unspecified: Secondary | ICD-10-CM | POA: Diagnosis not present

## 2018-04-17 DIAGNOSIS — I10 Essential (primary) hypertension: Secondary | ICD-10-CM | POA: Insufficient documentation

## 2018-04-17 DIAGNOSIS — Z79899 Other long term (current) drug therapy: Secondary | ICD-10-CM | POA: Diagnosis not present

## 2018-04-17 DIAGNOSIS — Z87891 Personal history of nicotine dependence: Secondary | ICD-10-CM | POA: Diagnosis not present

## 2018-04-17 DIAGNOSIS — R0789 Other chest pain: Secondary | ICD-10-CM | POA: Diagnosis not present

## 2018-04-17 DIAGNOSIS — R079 Chest pain, unspecified: Secondary | ICD-10-CM | POA: Diagnosis not present

## 2018-04-17 LAB — CBC
HCT: 43.3 % (ref 39.0–52.0)
Hemoglobin: 14 g/dL (ref 13.0–17.0)
MCH: 29.9 pg (ref 26.0–34.0)
MCHC: 32.3 g/dL (ref 30.0–36.0)
MCV: 92.5 fL (ref 78.0–100.0)
PLATELETS: 164 10*3/uL (ref 150–400)
RBC: 4.68 MIL/uL (ref 4.22–5.81)
RDW: 13.6 % (ref 11.5–15.5)
WBC: 6.6 10*3/uL (ref 4.0–10.5)

## 2018-04-17 LAB — BASIC METABOLIC PANEL
Anion gap: 7 (ref 5–15)
BUN: 10 mg/dL (ref 6–20)
CO2: 29 mmol/L (ref 22–32)
CREATININE: 0.99 mg/dL (ref 0.61–1.24)
Calcium: 8.8 mg/dL — ABNORMAL LOW (ref 8.9–10.3)
Chloride: 106 mmol/L (ref 101–111)
Glucose, Bld: 120 mg/dL — ABNORMAL HIGH (ref 65–99)
Potassium: 3.7 mmol/L (ref 3.5–5.1)
Sodium: 142 mmol/L (ref 135–145)

## 2018-04-17 LAB — I-STAT TROPONIN, ED
TROPONIN I, POC: 0.01 ng/mL (ref 0.00–0.08)
Troponin i, poc: 0.01 ng/mL (ref 0.00–0.08)

## 2018-04-17 MED ORDER — CYCLOBENZAPRINE HCL 10 MG PO TABS: 5.0000 mg | ORAL_TABLET | Freq: Once | ORAL | Status: DC

## 2018-04-17 MED ORDER — CYCLOBENZAPRINE HCL 5 MG PO TABS: 5.0000 mg | ORAL_TABLET | Freq: Three times a day (TID) | ORAL | 0 refills | Status: DC | PRN

## 2018-04-17 NOTE — ED Notes (Signed)
Pt left prior to meds being given

## 2018-04-17 NOTE — Discharge Instructions (Signed)
Take flexeril as needed for chest wall pain.   Call Dr. Johnsie Cancel tomorrow for appointment in the clinic   Return to ER if you have worse chest pain, trouble breathing.

## 2018-04-17 NOTE — ED Provider Notes (Signed)
[facility] Provider Note   CSN: [id number] Arrival date & time: 04/17/18  1201     History   Chief Complaint Chief Complaint  Patient presents with  . Chest Pain    HPI [name] is a 67 y.o. male history of hyperlipidemia, previous MI status post stents, here presenting with chest pain.  Patient states that about 2 days ago, he picked up a heavy grill and then subsequently had some left-sided chest pain.  She states that it is like a dull ache on the left side of his chest and occasionally radiating down his left arm.  He states that he has some mild shortness of breath but denies any diaphoresis.  Has seen cardiology for this before and had a nuclear stress test several years ago.  Patient was told that he cannot have aspirin since he has thrombocytopenia.  Patient denies any recent travel or history of blood clots.  The history is provided by the patient.    Past Medical History:  Diagnosis Date  . Allergy   . Carpal tunnel syndrome   . Chronic back pain   . Coronary artery disease   . Degenerative disc disease   . GERD (gastroesophageal reflux disease)   . History of thrombocytopenia   . Hypercholesteremia   . Hypertension   . Hypothyroid   . Lumbar pain   . Myocardial infarction ([facility]) 2009  . Shortness of breath dyspnea     Patient Active Problem List   Diagnosis Date Noted  . Peripheral edema 06/30/2017  . Pedal edema 11/28/2016  . Osteopenia 08/13/2015  . Thrombocytopenia ([facility]) 05/25/2015  . Chronic ITP (idiopathic thrombocytopenia) ([facility]) 05/25/2015  . Hypothyroidism 08/29/2014  . Diverticulitis of colon (without mention of hemorrhage)(562.11) 02/04/2014  . Dysphagia, unspecified(787.20) 02/04/2014  . Hyperlipidemia 11/18/2013  . Dyspnea 01/02/2012  . Palpitations 01/02/2012  . CAD (coronary artery disease) 07/06/2011  . Mixed hyperlipidemia 07/06/2011  . TOTAL KNEE FOLLOW-UP 08/14/2008  . BACK PAIN 07/23/2008    . KNEE, ARTHRITIS, DEGEN./OSTEO 05/29/2008  . JOINT EFFUSION, KNEE 04/30/2008  . Pain in joint, lower leg 10/10/2007  . Essential hypertension 10/09/2007    Past Surgical History:  Procedure Laterality Date  . BACK SURGERY     5 lumbas disc with cervical and lumbar fusions  . BIOPSY N/A 03/06/2014   Procedure: ESOPHAGEAL BIOPSIES;  Surgeon: [name], MD;  Location: [facility];  Service: Endoscopy;  Laterality: N/A;  . COLONOSCOPY    . COLONOSCOPY WITH PROPOFOL N/A 03/06/2014   Procedure: COLONOSCOPY WITH PROPOFOL;  Surgeon: [name], MD;  Location: [facility];  Service: Endoscopy;  Laterality: N/A;  in cecum at 0807; total withdrawal time 9 minutes  . CORONARY ANGIOPLASTY WITH STENT PLACEMENT     2000, and 2004 has 3 stents  . ESOPHAGOGASTRODUODENOSCOPY (EGD) WITH PROPOFOL N/A 03/06/2014   Procedure: ESOPHAGOGASTRODUODENOSCOPY (EGD) WITH PROPOFOL;  Surgeon: [name], MD;  Location: [facility];  Service: Endoscopy;  Laterality: N/A;  . LUMBAR FUSION  2009  . MALONEY DILATION N/A 03/06/2014   Procedure: MALONEY DILATION 54 french;  Surgeon: [name], MD;  Location: [facility];  Service: Endoscopy;  Laterality: N/A;  . NECK SURGERY          Home Medications    Prior to Admission medications   Medication Sig Start Date End Date Taking? Authorizing Provider  albuterol (PROVENTIL HFA;VENTOLIN HFA) 108 (90 Base) MCG/ACT inhaler Inhale 2 puffs into the lungs  every 4 (four) hours as needed for wheezing or shortness of breath. 05/30/16  Yes Nilda Simmer, NP  atorvastatin (LIPITOR) 80 MG tablet Take 1 tablet (80 mg total) by mouth daily. 11/13/17  Yes Kathyrn Drown, MD  cetirizine (ZYRTEC) 10 MG tablet Take 10 mg by mouth at bedtime.     Yes [provider]  citalopram (CELEXA) 20 MG tablet TAKE 1 TABLET BY MOUTH EVERY MORNING. Patient taking differently: TAKE 1 TABLET(20mg ) BY MOUTH EVERY MORNING. 07/24/17  Yes Mikey Kirschner, MD  doxazosin (CARDURA) 4 MG tablet TAKE  1 TABLET BY MOUTH EVERY MORNING. Patient taking differently: TAKE 1 TABLET(4mg ) BY MOUTH EVERY MORNING. 07/24/17  Yes Mikey Kirschner, MD  doxycycline (VIBRA-TABS) 100 MG tablet Take 1 tablet (100 mg total) by mouth 2 (two) times daily. 01/10/18  Yes Mikey Kirschner, MD  fentaNYL (DURAGESIC - DOSED MCG/HR) 25 MCG/HR patch Place 1 patch onto the skin every 3 (three) days. 03/27/18  Yes [provider]  gabapentin (NEURONTIN) 100 MG capsule Start with taking one capsule in the evening for the next four days,then one capsule BID for the following four days,then one capsule three times per day. Patient taking differently: Take 100 mg by mouth 3 (three) times daily.  03/23/18  Yes [name], Elayne Snare, MD  levothyroxine (SYNTHROID, LEVOTHROID) 112 MCG tablet TAKE 1 TABLET BY MOUTH DAILY BEFORE BREAKFAST. Patient taking differently: TAKE 1 TABLET(140mcg) BY MOUTH DAILY BEFORE BREAKFAST. 12/13/17  Yes [name], Elayne Snare, MD  metoprolol tartrate (LOPRESSOR) 50 MG tablet TAKE 1 TABLET BY MOUTH TWICE DAILY. Patient taking differently: TAKE 1 TABLET(50mg ) BY MOUTH TWICE DAILY. 02/21/18  Yes Nilda Simmer, NP  Multiple Vitamin (MULTIVITAMIN) capsule Take 1 capsule by mouth daily.     Yes [provider]  nitroGLYCERIN (NITROSTAT) 0.4 MG SL tablet Place 1 tablet (0.4 mg total) under the tongue every 5 (five) minutes as needed for chest pain. 05/30/16  Yes Nilda Simmer, NP  pantoprazole (PROTONIX) 40 MG tablet TAKE 1 TABLET BY MOUTH ONCE A DAY. Patient taking differently: TAKE 1 TABLET(40mg ) BY MOUTH ONCE A DAY. 07/24/17  Yes Mikey Kirschner, MD  potassium chloride (K-DUR,KLOR-CON) 10 MEQ tablet Take 2 tablets (20 mEq total) by mouth 2 (two) times daily. 12/22/16  Yes Kathyrn Drown, MD  tamsulosin (FLOMAX) 0.4 MG CAPS capsule TAKE 1 CAPSULE BY MOUTH DAILY. Patient taking differently: TAKE 1 CAPSULE (0.4mg ) BY MOUTH DAILY. 02/21/18  Yes Nilda Simmer, NP  torsemide (DEMADEX) 20 MG tablet  Take 40 mg by mouth 2 (two) times daily. Take 2 tablets(40mg ) in the morning and at noon.   Yes [provider]  potassium chloride (K-DUR) 10 MEQ tablet TAKE 2 TABLETS BY MOUTH 2 TIMES DAILY. Patient not taking: Reported on 04/17/2018 06/16/17   Nilda Simmer, NP  predniSONE (DELTASONE) 20 MG tablet 3qd for 3d then 2qd for 3d then 1qd for 3d Patient not taking: Reported on 04/17/2018 03/15/18   Kathyrn Drown, MD  torsemide (DEMADEX) 20 MG tablet TAKE 2 TABLETS BY MOUTH IN THE MORNING AND 2 TABLETS BY MOUTH AT NOON. Patient not taking: Reported on 04/17/2018 07/24/17   Kathyrn Drown, MD  triamcinolone cream (KENALOG) 0.1 % Apply 1 application topically 2 (two) times daily. Patient not taking: Reported on 04/17/2018 12/15/16   Mikey Kirschner, MD    Family History Family History  Problem Relation Age of Onset  . Heart attack Father  Social History Social History   Tobacco Use  . Smoking status: Former Smoker    Packs/day: 2.00    Years: 20.00    Pack years: 40.00    Types: Cigarettes    Last attempt to quit: 07/06/1991    Years since quitting: 26.8  . Smokeless tobacco: Never Used  Substance Use Topics  . Alcohol use: No    Comment: occassional  . Drug use: No     Allergies   Penicillins; Sulfa antibiotics; and Zoloft [sertraline hcl]   Review of Systems Review of Systems  Cardiovascular: Positive for chest pain.  All other systems reviewed and are negative.    Physical Exam Updated Vital Signs BP 123/82 (BP Location: Right Arm)   Pulse (!) 59   Temp 97.7 F (36.5 C) (Oral)   Resp 16   SpO2 98%   Physical Exam  Constitutional: He is oriented to person, place, and time. He appears well-developed and well-nourished.  HENT:  Head: Normocephalic.  Eyes: Pupils are equal, round, and reactive to light.  Neck: Normal range of motion. Neck supple.  Cardiovascular: Normal rate, regular rhythm and normal pulses.  Pulmonary/Chest: Effort normal and  breath sounds normal.  Mild reproducible L chest wall tenderness   Abdominal: Soft. Bowel sounds are normal.  Musculoskeletal: Normal range of motion.       Right lower leg: Normal.       Left lower leg: Normal.  Neurological: He is alert and oriented to person, place, and time.  Skin: Skin is warm.  Psychiatric: He has a normal mood and affect. His behavior is normal.  Nursing note and vitals reviewed.    ED Treatments / Results  Labs (all labs ordered are listed, but only abnormal results are displayed) Labs Reviewed  BASIC METABOLIC PANEL - Abnormal; Notable for the following components:      Result Value   Glucose, Bld 120 (*)    Calcium 8.8 (*)    All other components within normal limits  CBC  I-STAT TROPONIN, ED  I-STAT TROPONIN, ED    EKG EKG Interpretation  Date/Time:  Tuesday April 17 2018 12:09:02 EDT Ventricular Rate:  65 PR Interval:  196 QRS Duration: 102 QT Interval:  400 QTC Calculation: 416 R Axis:   19 Text Interpretation:  Normal sinus rhythm Minimal voltage criteria for LVH, may be normal variant ST & T wave abnormality, consider lateral ischemia Abnormal ECG No significant change since last tracing Confirmed by Wandra Arthurs [phone]) on 04/17/2018 5:14:41 PM   Radiology Dg Chest 2 View  Result Date: 04/17/2018 CLINICAL DATA:  Chest pain. EXAM: CHEST - 2 VIEW COMPARISON:  Chest x-ray 05/25/2015. FINDINGS: Mediastinum hilar structures normal. Lungs are clear. No pleural effusion pneumothorax. Cardiac stents noted. Stable cardiomegaly with normal pulmonary vascularity. Degenerative change thoracic spine. Prior cervicothoracic spine fusion. IMPRESSION: 1. Cardiac stents noted. Cardiomegaly with normal pulmonary vascularity. Heart size is stable from 05/25/2015. 2.  No acute pulmonary disease. Electronically Signed   By: Marcello Moores  Register   On: 04/17/2018 12:49    Procedures Procedures (including critical care time)  Medications Ordered in ED Medications    cyclobenzaprine (FLEXERIL) tablet 5 mg (has no administration in time range)     Initial Impression / Assessment and Plan / ED Course  I have reviewed the triage vital signs and the nursing notes.  Pertinent labs & imaging results that were available during my care of the patient were reviewed by me and considered in my  medical decision making (see chart for details).    [name] is a 67 y.o. male here with L chest pain. Chest pain reproducible. He does have remote hx of CAD with stents but has seen cardiology in the past and had previous unremarkable nuclear stress test. I suspect ACS vs MSK pain. Will get labs, trop x 2, CXR. I doubt PE or dissection   6:39 PM Delta trop neg. Pain improved with flexeril. Will dc home with flexeril. Told him to call Dr. Kyla Balzarine office tomorrow for appointment.    Final Clinical Impressions(s) / ED Diagnoses   Final diagnoses:  None    ED Discharge Orders    None       Drenda Freeze, MD 04/17/18 1840

## 2018-04-17 NOTE — ED Triage Notes (Signed)
Pt in c/o chest pain x2 days, hx of cardiac cath with stents, pain to left side of chest with radiation down his left arm, denies n/v, some mild shortness of breath, no distress noted

## 2018-04-18 NOTE — Progress Notes (Signed)
Cardiology Office Note   Date:  04/19/2018   ID:  [name], DOB Dec 08, 1950, MRN [id number]  PCP:  Kathyrn Drown, MD  Cardiologist:   Jenkins Rouge, MD   No chief complaint on file.     History of Present Illness: 67 y.o. f/u CAD CRF;s HTN and HLD PMH also includes ITP on steroids with LE edema, Chronic pain cervical and lumbar fusion and anxiety  Distant history of CAD  Stents in circumflex/right and LAD  Last cath Dr Lia Foyer 2012.  With restenting of OM branch patent LAD Stent and diffuse RCA disease not focal.  Initial stents placed by Dr Lyndel Safe in 2003    Cannot walk on treadmill due to left knee pain. Last myovue said he felt horrible and had a reaction but was just intense dyspnea And some chest pain. From adenosine   Seen in ER 04/17/18 with chest pain. Related to picking up heavy grill. Dull ache radiating to left arm Not given  ASA due to ITP  He R/O CXR NAD no acute ECG changes Pain thought to be MSK and d/c home Had improvement with Flexeril  He is more concerned says he's having exertional dyspnea and tightness in his chest like before his last Stent. No rest pain Some lightheadedness. Needs new nitro.   His PLTls have been stable currently not taking ASA No bleeding issues and his PLTls have been 134 and over 100 for at least a year with no epistaxis or petechiae    Past Medical History:  Diagnosis Date  . Allergy   . Carpal tunnel syndrome   . Chronic back pain   . Coronary artery disease   . Degenerative disc disease   . GERD (gastroesophageal reflux disease)   . History of thrombocytopenia   . Hypercholesteremia   . Hypertension   . Hypothyroid   . Lumbar pain   . Myocardial infarction ([place]) 2009  . Shortness of breath dyspnea     Past Surgical History:  Procedure Laterality Date  . BACK SURGERY     5 lumbas disc with cervical and lumbar fusions  . BIOPSY N/A 03/06/2014   Procedure: ESOPHAGEAL BIOPSIES;  Surgeon: [name], MD;   Location: [facility];  Service: Endoscopy;  Laterality: N/A;  . COLONOSCOPY    . COLONOSCOPY WITH PROPOFOL N/A 03/06/2014   Procedure: COLONOSCOPY WITH PROPOFOL;  Surgeon: [name], MD;  Location: [facility];  Service: Endoscopy;  Laterality: N/A;  in cecum at 0807; total withdrawal time 9 minutes  . CORONARY ANGIOPLASTY WITH STENT PLACEMENT     2000, and 2004 has 3 stents  . ESOPHAGOGASTRODUODENOSCOPY (EGD) WITH PROPOFOL N/A 03/06/2014   Procedure: ESOPHAGOGASTRODUODENOSCOPY (EGD) WITH PROPOFOL;  Surgeon: [name], MD;  Location: [facility];  Service: Endoscopy;  Laterality: N/A;  . LUMBAR FUSION  2009  . MALONEY DILATION N/A 03/06/2014   Procedure: MALONEY DILATION 54 french;  Surgeon: [name], MD;  Location: [facility];  Service: Endoscopy;  Laterality: N/A;  . NECK SURGERY       Current Outpatient Medications  Medication Sig Dispense Refill  . albuterol (PROVENTIL HFA;VENTOLIN HFA) 108 (90 Base) MCG/ACT inhaler Inhale 2 puffs into the lungs every 4 (four) hours as needed for wheezing or shortness of breath. 1 Inhaler 2  . atorvastatin (LIPITOR) 80 MG tablet Take 1 tablet (80 mg total) by mouth daily. 90 tablet 0  . cetirizine (ZYRTEC) 10 MG tablet Take 10 mg by mouth  at bedtime.      . citalopram (CELEXA) 20 MG tablet TAKE 1 TABLET BY MOUTH EVERY MORNING. (Patient taking differently: TAKE 1 TABLET(20mg ) BY MOUTH EVERY MORNING.) 90 tablet 1  . cyclobenzaprine (FLEXERIL) 5 MG tablet Take 1 tablet (5 mg total) by mouth 3 (three) times daily as needed. 10 tablet 0  . doxazosin (CARDURA) 4 MG tablet TAKE 1 TABLET BY MOUTH EVERY MORNING. (Patient taking differently: TAKE 1 TABLET(4mg ) BY MOUTH EVERY MORNING.) 90 tablet 1  . gabapentin (NEURONTIN) 100 MG capsule Start with taking one capsule in the evening for the next four days,then one capsule BID for the following four days,then one capsule three times per day. (Patient taking differently: Take 100 mg by mouth 3 (three) times daily. ) 90  capsule 2  . levothyroxine (SYNTHROID, LEVOTHROID) 112 MCG tablet TAKE 1 TABLET BY MOUTH DAILY BEFORE BREAKFAST. (Patient taking differently: TAKE 1 TABLET(170mcg) BY MOUTH DAILY BEFORE BREAKFAST.) 90 tablet 0  . metoprolol tartrate (LOPRESSOR) 50 MG tablet TAKE 1 TABLET BY MOUTH TWICE DAILY. (Patient taking differently: TAKE 1 TABLET(50mg ) BY MOUTH TWICE DAILY.) 60 tablet 5  . Multiple Vitamin (MULTIVITAMIN) capsule Take 1 capsule by mouth daily.      . nitroGLYCERIN (NITROSTAT) 0.4 MG SL tablet Place 1 tablet (0.4 mg total) under the tongue every 5 (five) minutes as needed for chest pain. 10 tablet 2  . pantoprazole (PROTONIX) 40 MG tablet TAKE 1 TABLET BY MOUTH ONCE A DAY. (Patient taking differently: TAKE 1 TABLET(40mg ) BY MOUTH ONCE A DAY.) 90 tablet 1  . potassium chloride SA (K-DUR,KLOR-CON) 20 MEQ tablet Take 40 mEq by mouth daily.    . tamsulosin (FLOMAX) 0.4 MG CAPS capsule TAKE 1 CAPSULE BY MOUTH DAILY. (Patient taking differently: TAKE 1 CAPSULE (0.4mg ) BY MOUTH DAILY.) 30 capsule 5  . torsemide (DEMADEX) 20 MG tablet Take 40 mg by mouth 2 (two) times daily.      No current facility-administered medications for this visit.     Allergies:   Penicillins; Sulfa antibiotics; and Zoloft [sertraline hcl]    Social History:  The patient  reports that he quit smoking about 26 years ago. His smoking use included cigarettes. He has a 40.00 pack-year smoking history. He has never used smokeless tobacco. He reports that he does not drink alcohol or use drugs.   Family History:  The patient's family history includes Heart attack in his father.    ROS:  Please see the history of present illness.   Otherwise, review of systems are positive for none.   All other systems are reviewed and negative.    PHYSICAL EXAM: VS:  Pulse 82   Ht 5\' 11"  (1.803 m)   Wt 277 lb 12.8 oz (126 kg)   SpO2 96% Comment: on room air  BMI 38.75 kg/m  , BMI Body mass index is 38.75 kg/m. Affect  appropriate Healthy:  appears stated age 67: normal Neck supple with no adenopathy JVP normal no bruits no thyromegaly Lungs clear with no wheezing and good diaphragmatic motion Heart:  S1/S2 no murmur, no rub, gallop or click PMI normal Abdomen: benighn, BS positve, no tenderness, no AAA no bruit.  No HSM or HJR Distal pulses intact with no bruits Plus 1 bilateral LE edema with stasis worse in LLE with TKR Neuro non-focal Skin warm and dry No muscular weakness    EKG:  03/03/14  SR normal ECG  06/28/16  SR rate 86  T wave inversions 3, F 06/02/17 NSR  normal ECG rate 79   Recent Labs: 04/28/2017: ALT 16; B Natriuretic Peptide 81.9 04/17/2018: BUN 10; Creatinine, Ser 0.99; Hemoglobin 14.0; Platelets 164; Potassium 3.7; Sodium 142    Lipid Panel    Component Value Date/Time   CHOL 213 (H) 06/06/2016 0906   TRIG 71 06/06/2016 0906   HDL 43 06/06/2016 0906   CHOLHDL 5.0 06/06/2016 0906   CHOLHDL 5.0 07/05/2013 0850   VLDL 24 07/05/2013 0850   LDLCALC 156 (H) 06/06/2016 0906      Wt Readings from Last 3 Encounters:  04/19/18 277 lb 12.8 oz (126 kg)  03/05/18 278 lb 3.2 oz (126.2 kg)  02/13/18 288 lb 3.2 oz (130.7 kg)      Other studies Reviewed: Additional studies/ records that were reviewed today include: Cath notes notes primary Dr Wolfgang Phoenix .    ASSESSMENT AND PLAN:  1.  CAD:  Multiple previous stents  Issues with ASA given ITP Recent ER visit favor diagnostic cath for definitive Diagnosis. Start ASA. He has been on DAT without issue after his previous stents but with ITP BMS would be best Risks including bleeding, stroke contrast reaction and need for emergency surgery discussed Willing to proceed 2. Dyspnea: functional TTE done 06/12/17 EF 55-60% no significant valve disease Normal diastolic function  3. ITP PLTls 134 not needed steroids in past 2 years no bleeding issues stable for DAT if needed If there Are issues in hospital can consult Dr Alen Blew  4. Chol  continue high dose lipitor consider adding zetia 5. HTN;  Well controlled.  Continue current medications and low sodium Dash type diet.   6. Edema with venous stasis continue diuretics and elevation as able  7. Neuro:  Chronic pain management Gave him Driscilla Moats name as alternative neurosurgeion 8. Thyroid:  On replacement TSH normal f/u with primary   Current medicines are reviewed at length with the patient today.  The patient does not have concerns regarding medicines.  The following changes have been made:  New script nitro   Labs/ tests ordered today pre cath   No orders of the defined types were placed in this encounter.    Disposition:   FU with [place] post cath     Signed, Jenkins Rouge, MD  04/19/2018 10:47 AM    [facility] [place], [place], [place]  [address] Phone: [phone]; Fax: [phone]

## 2018-04-18 NOTE — H&P (View-Only) (Signed)
Cardiology Office Note   Date:  04/19/2018   ID:  [name], DOB 29-Jan-1951, MRN [id number]  PCP:  Kathyrn Drown, MD  Cardiologist:   Jenkins Rouge, MD   No chief complaint on file.     History of Present Illness: 67 y.o. f/u CAD CRF;s HTN and HLD PMH also includes ITP on steroids with LE edema, Chronic pain cervical and lumbar fusion and anxiety  Distant history of CAD  Stents in circumflex/right and LAD  Last cath Dr Lia Foyer 2012.  With restenting of OM branch patent LAD Stent and diffuse RCA disease not focal.  Initial stents placed by Dr Lyndel Safe in 2003    Cannot walk on treadmill due to left knee pain. Last myovue said he felt horrible and had a reaction but was just intense dyspnea And some chest pain. From adenosine   Seen in ER 04/17/18 with chest pain. Related to picking up heavy grill. Dull ache radiating to left arm Not given  ASA due to ITP  He R/O CXR NAD no acute ECG changes Pain thought to be MSK and d/c home Had improvement with Flexeril  He is more concerned says he's having exertional dyspnea and tightness in his chest like before his last Stent. No rest pain Some lightheadedness. Needs new nitro.   His PLTls have been stable currently not taking ASA No bleeding issues and his PLTls have been 134 and over 100 for at least a year with no epistaxis or petechiae    Past Medical History:  Diagnosis Date  . Allergy   . Carpal tunnel syndrome   . Chronic back pain   . Coronary artery disease   . Degenerative disc disease   . GERD (gastroesophageal reflux disease)   . History of thrombocytopenia   . Hypercholesteremia   . Hypertension   . Hypothyroid   . Lumbar pain   . Myocardial infarction ([place]) 2009  . Shortness of breath dyspnea     Past Surgical History:  Procedure Laterality Date  . BACK SURGERY     5 lumbas disc with cervical and lumbar fusions  . BIOPSY N/A 03/06/2014   Procedure: ESOPHAGEAL BIOPSIES;  Surgeon: [name], MD;   Location: [facility];  Service: Endoscopy;  Laterality: N/A;  . COLONOSCOPY    . COLONOSCOPY WITH PROPOFOL N/A 03/06/2014   Procedure: COLONOSCOPY WITH PROPOFOL;  Surgeon: [name], MD;  Location: [facility];  Service: Endoscopy;  Laterality: N/A;  in cecum at 0807; total withdrawal time 9 minutes  . CORONARY ANGIOPLASTY WITH STENT PLACEMENT     2000, and 2004 has 3 stents  . ESOPHAGOGASTRODUODENOSCOPY (EGD) WITH PROPOFOL N/A 03/06/2014   Procedure: ESOPHAGOGASTRODUODENOSCOPY (EGD) WITH PROPOFOL;  Surgeon: [name], MD;  Location: [facility];  Service: Endoscopy;  Laterality: N/A;  . LUMBAR FUSION  2009  . MALONEY DILATION N/A 03/06/2014   Procedure: MALONEY DILATION 54 french;  Surgeon: [name], MD;  Location: [facility];  Service: Endoscopy;  Laterality: N/A;  . NECK SURGERY       Current Outpatient Medications  Medication Sig Dispense Refill  . albuterol (PROVENTIL HFA;VENTOLIN HFA) 108 (90 Base) MCG/ACT inhaler Inhale 2 puffs into the lungs every 4 (four) hours as needed for wheezing or shortness of breath. 1 Inhaler 2  . atorvastatin (LIPITOR) 80 MG tablet Take 1 tablet (80 mg total) by mouth daily. 90 tablet 0  . cetirizine (ZYRTEC) 10 MG tablet Take 10 mg by mouth  at bedtime.      . citalopram (CELEXA) 20 MG tablet TAKE 1 TABLET BY MOUTH EVERY MORNING. (Patient taking differently: TAKE 1 TABLET(20mg ) BY MOUTH EVERY MORNING.) 90 tablet 1  . cyclobenzaprine (FLEXERIL) 5 MG tablet Take 1 tablet (5 mg total) by mouth 3 (three) times daily as needed. 10 tablet 0  . doxazosin (CARDURA) 4 MG tablet TAKE 1 TABLET BY MOUTH EVERY MORNING. (Patient taking differently: TAKE 1 TABLET(4mg ) BY MOUTH EVERY MORNING.) 90 tablet 1  . gabapentin (NEURONTIN) 100 MG capsule Start with taking one capsule in the evening for the next four days,then one capsule BID for the following four days,then one capsule three times per day. (Patient taking differently: Take 100 mg by mouth 3 (three) times daily. ) 90  capsule 2  . levothyroxine (SYNTHROID, LEVOTHROID) 112 MCG tablet TAKE 1 TABLET BY MOUTH DAILY BEFORE BREAKFAST. (Patient taking differently: TAKE 1 TABLET(162mcg) BY MOUTH DAILY BEFORE BREAKFAST.) 90 tablet 0  . metoprolol tartrate (LOPRESSOR) 50 MG tablet TAKE 1 TABLET BY MOUTH TWICE DAILY. (Patient taking differently: TAKE 1 TABLET(50mg ) BY MOUTH TWICE DAILY.) 60 tablet 5  . Multiple Vitamin (MULTIVITAMIN) capsule Take 1 capsule by mouth daily.      . nitroGLYCERIN (NITROSTAT) 0.4 MG SL tablet Place 1 tablet (0.4 mg total) under the tongue every 5 (five) minutes as needed for chest pain. 10 tablet 2  . pantoprazole (PROTONIX) 40 MG tablet TAKE 1 TABLET BY MOUTH ONCE A DAY. (Patient taking differently: TAKE 1 TABLET(40mg ) BY MOUTH ONCE A DAY.) 90 tablet 1  . potassium chloride SA (K-DUR,KLOR-CON) 20 MEQ tablet Take 40 mEq by mouth daily.    . tamsulosin (FLOMAX) 0.4 MG CAPS capsule TAKE 1 CAPSULE BY MOUTH DAILY. (Patient taking differently: TAKE 1 CAPSULE (0.4mg ) BY MOUTH DAILY.) 30 capsule 5  . torsemide (DEMADEX) 20 MG tablet Take 40 mg by mouth 2 (two) times daily.      No current facility-administered medications for this visit.     Allergies:   Penicillins; Sulfa antibiotics; and Zoloft [sertraline hcl]    Social History:  The patient  reports that he quit smoking about 26 years ago. His smoking use included cigarettes. He has a 40.00 pack-year smoking history. He has never used smokeless tobacco. He reports that he does not drink alcohol or use drugs.   Family History:  The patient's family history includes Heart attack in his father.    ROS:  Please see the history of present illness.   Otherwise, review of systems are positive for none.   All other systems are reviewed and negative.    PHYSICAL EXAM: VS:  Pulse 82   Ht 5\' 11"  (1.803 m)   Wt 277 lb 12.8 oz (126 kg)   SpO2 96% Comment: on room air  BMI 38.75 kg/m  , BMI Body mass index is 38.75 kg/m. Affect  appropriate Healthy:  appears stated age 22: normal Neck supple with no adenopathy JVP normal no bruits no thyromegaly Lungs clear with no wheezing and good diaphragmatic motion Heart:  S1/S2 no murmur, no rub, gallop or click PMI normal Abdomen: benighn, BS positve, no tenderness, no AAA no bruit.  No HSM or HJR Distal pulses intact with no bruits Plus 1 bilateral LE edema with stasis worse in LLE with TKR Neuro non-focal Skin warm and dry No muscular weakness    EKG:  03/03/14  SR normal ECG  06/28/16  SR rate 86  T wave inversions 3, F 06/02/17 NSR  normal ECG rate 79   Recent Labs: 04/28/2017: ALT 16; B Natriuretic Peptide 81.9 04/17/2018: BUN 10; Creatinine, Ser 0.99; Hemoglobin 14.0; Platelets 164; Potassium 3.7; Sodium 142    Lipid Panel    Component Value Date/Time   CHOL 213 (H) 06/06/2016 0906   TRIG 71 06/06/2016 0906   HDL 43 06/06/2016 0906   CHOLHDL 5.0 06/06/2016 0906   CHOLHDL 5.0 07/05/2013 0850   VLDL 24 07/05/2013 0850   LDLCALC 156 (H) 06/06/2016 0906      Wt Readings from Last 3 Encounters:  04/19/18 277 lb 12.8 oz (126 kg)  03/05/18 278 lb 3.2 oz (126.2 kg)  02/13/18 288 lb 3.2 oz (130.7 kg)      Other studies Reviewed: Additional studies/ records that were reviewed today include: Cath notes notes primary Dr Wolfgang Phoenix .    ASSESSMENT AND PLAN:  1.  CAD:  Multiple previous stents  Issues with ASA given ITP Recent ER visit favor diagnostic cath for definitive Diagnosis. Start ASA. He has been on DAT without issue after his previous stents but with ITP BMS would be best Risks including bleeding, stroke contrast reaction and need for emergency surgery discussed Willing to proceed 2. Dyspnea: functional TTE done 06/12/17 EF 55-60% no significant valve disease Normal diastolic function  3. ITP PLTls 134 not needed steroids in past 2 years no bleeding issues stable for DAT if needed If there Are issues in hospital can consult Dr Alen Blew  4. Chol  continue high dose lipitor consider adding zetia 5. HTN;  Well controlled.  Continue current medications and low sodium Dash type diet.   6. Edema with venous stasis continue diuretics and elevation as able  7. Neuro:  Chronic pain management Gave him Driscilla Moats name as alternative neurosurgeion 8. Thyroid:  On replacement TSH normal f/u with primary   Current medicines are reviewed at length with the patient today.  The patient does not have concerns regarding medicines.  The following changes have been made:  New script nitro   Labs/ tests ordered today pre cath   No orders of the defined types were placed in this encounter.    Disposition:   FU with [place] post cath     Signed, Jenkins Rouge, MD  04/19/2018 10:47 AM    [facility] [place], [place], [place]  [address] Phone: [phone]; Fax: [phone]

## 2018-04-19 ENCOUNTER — Ambulatory Visit: Payer: Medicare Other | Admitting: Cardiovascular Disease

## 2018-04-19 ENCOUNTER — Encounter: Payer: Self-pay | Admitting: Cardiovascular Disease

## 2018-04-19 ENCOUNTER — Other Ambulatory Visit (HOSPITAL_COMMUNITY)
Admission: RE | Admit: 2018-04-19 | Discharge: 2018-04-19 | Disposition: A | Discharge status: Home / Self Care | Payer: Medicare Other | Source: Ambulatory Visit | Attending: Cardiovascular Disease | Admitting: Cardiovascular Disease

## 2018-04-19 VITALS — BP 151/85 | HR 82 | Ht 71.0 in | Wt 277.8 lb

## 2018-04-19 DIAGNOSIS — Z01818 Encounter for other preprocedural examination: Secondary | ICD-10-CM

## 2018-04-19 LAB — CBC WITH DIFFERENTIAL/PLATELET
BASOS ABS: 0 10*3/uL (ref 0.0–0.1)
Basophils Relative: 0 %
EOS ABS: 0.1 10*3/uL (ref 0.0–0.7)
Eosinophils Relative: 1 %
HEMATOCRIT: 44.6 % (ref 39.0–52.0)
HEMOGLOBIN: 14.7 g/dL (ref 13.0–17.0)
Lymphocytes Relative: 15 %
Lymphs Abs: 1.3 10*3/uL (ref 0.7–4.0)
MCH: 30.2 pg (ref 26.0–34.0)
MCHC: 33 g/dL (ref 30.0–36.0)
MCV: 91.6 fL (ref 78.0–100.0)
Monocytes Absolute: 0.5 10*3/uL (ref 0.1–1.0)
Monocytes Relative: 6 %
NEUTROS ABS: 6.5 10*3/uL (ref 1.7–7.7)
NEUTROS PCT: 78 %
Platelets: 161 10*3/uL (ref 150–400)
RBC: 4.87 MIL/uL (ref 4.22–5.81)
RDW: 13.9 % (ref 11.5–15.5)
WBC: 8.4 10*3/uL (ref 4.0–10.5)

## 2018-04-19 LAB — BASIC METABOLIC PANEL
ANION GAP: 10 (ref 5–15)
BUN: 20 mg/dL (ref 6–20)
CO2: 28 mmol/L (ref 22–32)
CREATININE: 1.02 mg/dL (ref 0.61–1.24)
Calcium: 8.9 mg/dL (ref 8.9–10.3)
Chloride: 102 mmol/L (ref 101–111)
Glucose, Bld: 127 mg/dL — ABNORMAL HIGH (ref 65–99)
Potassium: 3.5 mmol/L (ref 3.5–5.1)
SODIUM: 140 mmol/L (ref 135–145)

## 2018-04-19 MED ORDER — NITROGLYCERIN 0.4 MG SL SUBL: 0.4000 mg | SUBLINGUAL_TABLET | SUBLINGUAL | 2 refills | Status: DC | PRN

## 2018-04-19 NOTE — Patient Instructions (Signed)
Medication Instructions:  Your physician recommends that you continue on your current medications as directed. Please refer to the Current Medication list given to you today.   Labwork: TODAY   Testing/Procedures: Your physician has requested that you have a cardiac catheterization. Cardiac catheterization is used to diagnose and/or treat various heart conditions. Doctors may recommend this procedure for a number of different reasons. The most common reason is to evaluate chest pain. Chest pain can be a symptom of coronary artery disease (CAD), and cardiac catheterization can show whether plaque is narrowing or blocking your heart's arteries. This procedure is also used to evaluate the valves, as well as measure the blood flow and oxygen levels in different parts of your heart. For further information please visit [email or web address]. Please follow instruction sheet, as given.    Follow-Up: Your physician recommends that you schedule a follow-up appointment in: 1 MONTH (POST CATH)    Any Other Special Instructions Will Be Listed Below (If Applicable).     If you need a refill on your cardiac medications before your next appointment, please call your pharmacy.   [name] [id number] Dept: [phone] Loc: [phone]  Oscar GEISSINGER  04/19/2018  You are scheduled for a Cardiac Catheterization on Tuesday, June 25 with Dr. Larae Grooms.  1. Please arrive at the [facility] (Main Entrance A) at [facility]: [address] [place], [place] [address] at 5:30 AM (two hours before your procedure to ensure your preparation). Free valet parking service is available.   Special note: Every effort is made to have your procedure done on time. Please understand that emergencies sometimes delay scheduled procedures.  2. Diet: Do not eat or drink anything after midnight prior to your  procedure except sips of water to take medications.  3. Labs: Your labs will be performed at the hospital after you arrive for your procedure.  4. Medication instructions in preparation for your procedure:  TORSEMIDE    *For reference purposes while preparing patient instructions.   Delete this med list prior to printing instructions for patient.*    On the morning of your procedure, take your Aspirin and any morning medicines NOT listed above.  You may use sips of water.  5. Plan for one night stay--bring personal belongings. 6. Bring a current list of your medications and current insurance cards. 7. You MUST have a responsible person to drive you home. 8. Someone MUST be with you the first 24 hours after you arrive home or your discharge will be delayed. 9. Please wear clothes that are easy to get on and off and wear slip-on shoes.  Thank you for allowing [place] to care for you!   -- [facility]

## 2018-04-23 ENCOUNTER — Telehealth: Payer: Self-pay | Admitting: *Deleted

## 2018-04-23 NOTE — Telephone Encounter (Addendum)
Catheterization scheduled at [facility] for: Tuesday April 24, 2018 7:30 AM Verify arrival time and place: [place] Entrance A at: 5:30 AM  No solid food after midnight prior to cath, clear liquids until 5 AM day of procedure. Verify allergies in Epic Verify no diabetes medications.  Hold: Torsemide AM of procedure  Except hold medications AM meds can be  taken pre-cath with sip of water including: ASA 81 mg  Confirm patient has responsible person to drive home post procedure and observe patient for 24 hours :yes  Discussed instructions with patient, he verbalized understanding, thanked me for the call.

## 2018-04-24 ENCOUNTER — Other Ambulatory Visit: Payer: Self-pay

## 2018-04-24 ENCOUNTER — Inpatient Hospital Stay (HOSPITAL_COMMUNITY)
Admission: AD | Admit: 2018-04-24 | Discharge: 2018-05-03 | DRG: 234 | Disposition: A | Discharge status: Home / Self Care | Payer: Medicare Other | Attending: Cardiovascular Disease | Admitting: Cardiovascular Disease

## 2018-04-24 ENCOUNTER — Inpatient Hospital Stay (HOSPITAL_COMMUNITY)
Admission: AD | Disposition: A | Discharge status: Home / Self Care | Payer: Self-pay | Source: Home / Self Care | Attending: Thoracic Surgery (Cardiothoracic Vascular Surgery)

## 2018-04-24 ENCOUNTER — Encounter (HOSPITAL_COMMUNITY): Payer: Self-pay | Admitting: Interventional Cardiology

## 2018-04-24 ENCOUNTER — Other Ambulatory Visit: Payer: Self-pay | Admitting: *Deleted

## 2018-04-24 DIAGNOSIS — Z955 Presence of coronary angioplasty implant and graft: Secondary | ICD-10-CM | POA: Diagnosis not present

## 2018-04-24 DIAGNOSIS — Z01810 Encounter for preprocedural cardiovascular examination: Secondary | ICD-10-CM | POA: Diagnosis not present

## 2018-04-24 DIAGNOSIS — M545 Low back pain: Secondary | ICD-10-CM | POA: Diagnosis present

## 2018-04-24 DIAGNOSIS — I2582 Chronic total occlusion of coronary artery: Secondary | ICD-10-CM | POA: Diagnosis present

## 2018-04-24 DIAGNOSIS — I10 Essential (primary) hypertension: Secondary | ICD-10-CM | POA: Diagnosis not present

## 2018-04-24 DIAGNOSIS — I493 Ventricular premature depolarization: Secondary | ICD-10-CM | POA: Diagnosis not present

## 2018-04-24 DIAGNOSIS — Z8249 Family history of ischemic heart disease and other diseases of the circulatory system: Secondary | ICD-10-CM

## 2018-04-24 DIAGNOSIS — R946 Abnormal results of thyroid function studies: Secondary | ICD-10-CM | POA: Diagnosis not present

## 2018-04-24 DIAGNOSIS — J9811 Atelectasis: Secondary | ICD-10-CM | POA: Diagnosis not present

## 2018-04-24 DIAGNOSIS — I110 Hypertensive heart disease with heart failure: Secondary | ICD-10-CM | POA: Diagnosis not present

## 2018-04-24 DIAGNOSIS — Z6838 Body mass index (BMI) 38.0-38.9, adult: Secondary | ICD-10-CM

## 2018-04-24 DIAGNOSIS — I25119 Atherosclerotic heart disease of native coronary artery with unspecified angina pectoris: Secondary | ICD-10-CM | POA: Diagnosis not present

## 2018-04-24 DIAGNOSIS — E669 Obesity, unspecified: Secondary | ICD-10-CM | POA: Diagnosis present

## 2018-04-24 DIAGNOSIS — E782 Mixed hyperlipidemia: Secondary | ICD-10-CM | POA: Diagnosis present

## 2018-04-24 DIAGNOSIS — G5600 Carpal tunnel syndrome, unspecified upper limb: Secondary | ICD-10-CM | POA: Diagnosis present

## 2018-04-24 DIAGNOSIS — I25110 Atherosclerotic heart disease of native coronary artery with unstable angina pectoris: Principal | ICD-10-CM | POA: Diagnosis present

## 2018-04-24 DIAGNOSIS — I2510 Atherosclerotic heart disease of native coronary artery without angina pectoris: Secondary | ICD-10-CM | POA: Diagnosis present

## 2018-04-24 DIAGNOSIS — D62 Acute posthemorrhagic anemia: Secondary | ICD-10-CM | POA: Diagnosis not present

## 2018-04-24 DIAGNOSIS — E8881 Metabolic syndrome: Secondary | ICD-10-CM | POA: Diagnosis present

## 2018-04-24 DIAGNOSIS — Z888 Allergy status to other drugs, medicaments and biological substances status: Secondary | ICD-10-CM

## 2018-04-24 DIAGNOSIS — I4891 Unspecified atrial fibrillation: Secondary | ICD-10-CM | POA: Diagnosis not present

## 2018-04-24 DIAGNOSIS — E039 Hypothyroidism, unspecified: Secondary | ICD-10-CM | POA: Diagnosis not present

## 2018-04-24 DIAGNOSIS — M1712 Unilateral primary osteoarthritis, left knee: Secondary | ICD-10-CM | POA: Diagnosis not present

## 2018-04-24 DIAGNOSIS — F419 Anxiety disorder, unspecified: Secondary | ICD-10-CM | POA: Diagnosis present

## 2018-04-24 DIAGNOSIS — I088 Other rheumatic multiple valve diseases: Secondary | ICD-10-CM | POA: Diagnosis not present

## 2018-04-24 DIAGNOSIS — Z951 Presence of aortocoronary bypass graft: Secondary | ICD-10-CM

## 2018-04-24 DIAGNOSIS — E785 Hyperlipidemia, unspecified: Secondary | ICD-10-CM | POA: Diagnosis present

## 2018-04-24 DIAGNOSIS — J9383 Other pneumothorax: Secondary | ICD-10-CM | POA: Diagnosis present

## 2018-04-24 DIAGNOSIS — Z91010 Allergy to peanuts: Secondary | ICD-10-CM

## 2018-04-24 DIAGNOSIS — I209 Angina pectoris, unspecified: Secondary | ICD-10-CM | POA: Diagnosis present

## 2018-04-24 DIAGNOSIS — J939 Pneumothorax, unspecified: Secondary | ICD-10-CM | POA: Diagnosis not present

## 2018-04-24 DIAGNOSIS — K219 Gastro-esophageal reflux disease without esophagitis: Secondary | ICD-10-CM | POA: Diagnosis present

## 2018-04-24 DIAGNOSIS — I878 Other specified disorders of veins: Secondary | ICD-10-CM | POA: Diagnosis present

## 2018-04-24 DIAGNOSIS — I5042 Chronic combined systolic (congestive) and diastolic (congestive) heart failure: Secondary | ICD-10-CM | POA: Diagnosis not present

## 2018-04-24 DIAGNOSIS — Z79899 Other long term (current) drug therapy: Secondary | ICD-10-CM

## 2018-04-24 DIAGNOSIS — Z87891 Personal history of nicotine dependence: Secondary | ICD-10-CM

## 2018-04-24 DIAGNOSIS — Z79890 Hormone replacement therapy (postmenopausal): Secondary | ICD-10-CM

## 2018-04-24 DIAGNOSIS — E7849 Other hyperlipidemia: Secondary | ICD-10-CM | POA: Diagnosis not present

## 2018-04-24 DIAGNOSIS — I252 Old myocardial infarction: Secondary | ICD-10-CM

## 2018-04-24 DIAGNOSIS — D693 Immune thrombocytopenic purpura: Secondary | ICD-10-CM | POA: Diagnosis present

## 2018-04-24 DIAGNOSIS — G8929 Other chronic pain: Secondary | ICD-10-CM | POA: Diagnosis present

## 2018-04-24 DIAGNOSIS — Z882 Allergy status to sulfonamides status: Secondary | ICD-10-CM

## 2018-04-24 DIAGNOSIS — Z981 Arthrodesis status: Secondary | ICD-10-CM

## 2018-04-24 DIAGNOSIS — Z9689 Presence of other specified functional implants: Secondary | ICD-10-CM

## 2018-04-24 DIAGNOSIS — M549 Dorsalgia, unspecified: Secondary | ICD-10-CM

## 2018-04-24 DIAGNOSIS — R0600 Dyspnea, unspecified: Secondary | ICD-10-CM | POA: Diagnosis present

## 2018-04-24 DIAGNOSIS — I351 Nonrheumatic aortic (valve) insufficiency: Secondary | ICD-10-CM | POA: Diagnosis not present

## 2018-04-24 DIAGNOSIS — Z09 Encounter for follow-up examination after completed treatment for conditions other than malignant neoplasm: Secondary | ICD-10-CM

## 2018-04-24 DIAGNOSIS — R079 Chest pain, unspecified: Secondary | ICD-10-CM | POA: Diagnosis present

## 2018-04-24 HISTORY — DX: Presence of aortocoronary bypass graft: Z95.1

## 2018-04-24 HISTORY — PX: LEFT HEART CATH AND CORONARY ANGIOGRAPHY: CATH118249

## 2018-04-24 HISTORY — DX: Immune thrombocytopenic purpura: D69.3

## 2018-04-24 HISTORY — DX: Atherosclerotic heart disease of native coronary artery with unspecified angina pectoris: I25.119

## 2018-04-24 HISTORY — DX: Chronic combined systolic (congestive) and diastolic (congestive) heart failure: I50.42

## 2018-04-24 HISTORY — DX: Obesity, unspecified: E66.9

## 2018-04-24 LAB — CREATININE, SERUM
CREATININE: 1.03 mg/dL (ref 0.61–1.24)
GFR calc non Af Amer: >60 mL/min (ref >60)

## 2018-04-24 LAB — CBC
HCT: 42.3 % (ref 39.0–52.0)
HEMOGLOBIN: 13.7 g/dL (ref 13.0–17.0)
MCH: 29.7 pg (ref 26.0–34.0)
MCHC: 32.4 g/dL (ref 30.0–36.0)
MCV: 91.8 fL (ref 78.0–100.0)
Platelets: 127 10*3/uL — ABNORMAL LOW (ref 150–400)
RBC: 4.61 MIL/uL (ref 4.22–5.81)
RDW: 14 % (ref 11.5–15.5)
WBC: 7 10*3/uL (ref 4.0–10.5)

## 2018-04-24 LAB — MRSA PCR SCREENING: MRSA by PCR: NEGATIVE

## 2018-04-24 SURGERY — LEFT HEART CATH AND CORONARY ANGIOGRAPHY
Anesthesia: LOCAL

## 2018-04-24 MED ORDER — LEVOTHYROXINE SODIUM 112 MCG PO TABS
112.0000 ug | ORAL_TABLET | Freq: Every day | ORAL | Status: DC
  Administered 2018-04-25 – 2018-05-03 (×8): 112 ug via ORAL
  Filled 2018-04-24 (×8): qty 1

## 2018-04-24 MED ORDER — LIDOCAINE HCL (PF) 1 % IJ SOLN
INTRAMUSCULAR | Status: AC
  Filled 2018-04-24: qty 30

## 2018-04-24 MED ORDER — CYCLOBENZAPRINE HCL 5 MG PO TABS: 5.0000 mg | ORAL_TABLET | Freq: Three times a day (TID) | ORAL | Status: DC | PRN

## 2018-04-24 MED ORDER — HEPARIN SODIUM (PORCINE) 1000 UNIT/ML IJ SOLN
INTRAMUSCULAR | Status: DC | PRN
  Administered 2018-04-24: 6000 [IU] via INTRAVENOUS

## 2018-04-24 MED ORDER — METOPROLOL TARTRATE 50 MG PO TABS
50.0000 mg | ORAL_TABLET | Freq: Two times a day (BID) | ORAL | Status: DC
  Administered 2018-04-24 – 2018-04-26 (×6): 50 mg via ORAL
  Filled 2018-04-24 (×6): qty 1

## 2018-04-24 MED ORDER — ASPIRIN 81 MG PO CHEW
81.0000 mg | CHEWABLE_TABLET | Freq: Every day | ORAL | Status: DC
  Administered 2018-04-25 – 2018-04-26 (×2): 81 mg via ORAL
  Filled 2018-04-24 (×2): qty 1

## 2018-04-24 MED ORDER — SODIUM CHLORIDE 0.9 % IV SOLN: 250.0000 mL | INTRAVENOUS | Status: DC | PRN

## 2018-04-24 MED ORDER — ACETAMINOPHEN 325 MG PO TABS: 650.0000 mg | ORAL_TABLET | ORAL | Status: DC | PRN

## 2018-04-24 MED ORDER — VERAPAMIL HCL 2.5 MG/ML IV SOLN
INTRAVENOUS | Status: DC | PRN
  Administered 2018-04-24: 10 mL via INTRA_ARTERIAL

## 2018-04-24 MED ORDER — ATORVASTATIN CALCIUM 80 MG PO TABS
80.0000 mg | ORAL_TABLET | Freq: Every day | ORAL | Status: DC
  Administered 2018-04-24 – 2018-05-02 (×8): 80 mg via ORAL
  Filled 2018-04-24 (×7): qty 1

## 2018-04-24 MED ORDER — FENTANYL CITRATE (PF) 100 MCG/2ML IJ SOLN
INTRAMUSCULAR | Status: DC | PRN
  Administered 2018-04-24: 50 ug via INTRAVENOUS
  Administered 2018-04-24 (×2): 25 ug via INTRAVENOUS

## 2018-04-24 MED ORDER — IOPAMIDOL (ISOVUE-370) INJECTION 76%
INTRAVENOUS | Status: DC | PRN
  Administered 2018-04-24: 60 mL

## 2018-04-24 MED ORDER — MORPHINE SULFATE (PF) 2 MG/ML IV SOLN
2.0000 mg | INTRAVENOUS | Status: DC | PRN
  Administered 2018-04-24: 2 mg via INTRAVENOUS
  Administered 2018-04-24: 4 mg via INTRAVENOUS
  Administered 2018-04-24 – 2018-04-25 (×2): 2 mg via INTRAVENOUS
  Administered 2018-04-25: 4 mg via INTRAVENOUS
  Administered 2018-04-25 – 2018-04-27 (×10): 2 mg via INTRAVENOUS
  Filled 2018-04-24 (×7): qty 1
  Filled 2018-04-24 (×2): qty 2
  Filled 2018-04-24 (×6): qty 1

## 2018-04-24 MED ORDER — LORATADINE 10 MG PO TABS
10.0000 mg | ORAL_TABLET | Freq: Every day | ORAL | Status: DC
  Administered 2018-04-24 – 2018-04-26 (×3): 10 mg via ORAL
  Filled 2018-04-24 (×3): qty 1

## 2018-04-24 MED ORDER — IOPAMIDOL (ISOVUE-370) INJECTION 76%
INTRAVENOUS | Status: AC
  Filled 2018-04-24: qty 100

## 2018-04-24 MED ORDER — DOXAZOSIN MESYLATE 4 MG PO TABS
4.0000 mg | ORAL_TABLET | Freq: Every morning | ORAL | Status: DC
  Administered 2018-04-24 – 2018-04-26 (×3): 4 mg via ORAL
  Filled 2018-04-24 (×3): qty 1

## 2018-04-24 MED ORDER — CITALOPRAM HYDROBROMIDE 20 MG PO TABS
20.0000 mg | ORAL_TABLET | Freq: Every morning | ORAL | Status: DC
  Administered 2018-04-24 – 2018-05-03 (×9): 20 mg via ORAL
  Filled 2018-04-24 (×9): qty 1

## 2018-04-24 MED ORDER — FENTANYL CITRATE (PF) 100 MCG/2ML IJ SOLN
INTRAMUSCULAR | Status: AC
  Filled 2018-04-24: qty 2

## 2018-04-24 MED ORDER — MIDAZOLAM HCL 2 MG/2ML IJ SOLN
INTRAMUSCULAR | Status: AC
  Filled 2018-04-24: qty 2

## 2018-04-24 MED ORDER — NITROGLYCERIN 0.4 MG SL SUBL: 0.4000 mg | SUBLINGUAL_TABLET | SUBLINGUAL | Status: DC | PRN

## 2018-04-24 MED ORDER — SODIUM CHLORIDE 0.9 % WEIGHT BASED INFUSION
3.0000 mL/kg/h | INTRAVENOUS | Status: AC
  Administered 2018-04-24: 3 mL/kg/h via INTRAVENOUS

## 2018-04-24 MED ORDER — SODIUM CHLORIDE 0.9% FLUSH
3.0000 mL | Freq: Two times a day (BID) | INTRAVENOUS | Status: DC
  Administered 2018-04-24 – 2018-04-25 (×3): 3 mL via INTRAVENOUS
  Administered 2018-04-25: 6 mL via INTRAVENOUS
  Administered 2018-04-26: 3 mL via INTRAVENOUS

## 2018-04-24 MED ORDER — SODIUM CHLORIDE 0.9 % IV SOLN
INTRAVENOUS | Status: AC
  Administered 2018-04-24: 09:00:00 via INTRAVENOUS

## 2018-04-24 MED ORDER — MIDAZOLAM HCL 2 MG/2ML IJ SOLN
INTRAMUSCULAR | Status: DC | PRN
  Administered 2018-04-24: 2 mg via INTRAVENOUS
  Administered 2018-04-24: 1 mg via INTRAVENOUS

## 2018-04-24 MED ORDER — SODIUM CHLORIDE 0.9 % WEIGHT BASED INFUSION: 1.0000 mL/kg/h | INTRAVENOUS | Status: DC

## 2018-04-24 MED ORDER — ALBUTEROL SULFATE (2.5 MG/3ML) 0.083% IN NEBU: 3.0000 mL | INHALATION_SOLUTION | RESPIRATORY_TRACT | Status: DC | PRN

## 2018-04-24 MED ORDER — PANTOPRAZOLE SODIUM 40 MG PO TBEC
40.0000 mg | DELAYED_RELEASE_TABLET | Freq: Every day | ORAL | Status: DC
  Administered 2018-04-24 – 2018-05-03 (×9): 40 mg via ORAL
  Filled 2018-04-24 (×8): qty 1

## 2018-04-24 MED ORDER — ASPIRIN 81 MG PO CHEW: 81.0000 mg | CHEWABLE_TABLET | ORAL | Status: DC

## 2018-04-24 MED ORDER — ONDANSETRON HCL 4 MG/2ML IJ SOLN: 4.0000 mg | Freq: Four times a day (QID) | INTRAMUSCULAR | Status: DC | PRN

## 2018-04-24 MED ORDER — HEPARIN (PORCINE) IN NACL 2-0.9 UNITS/ML
INTRAMUSCULAR | Status: AC | PRN
  Administered 2018-04-24 (×2): 500 mL

## 2018-04-24 MED ORDER — SODIUM CHLORIDE 0.9% FLUSH: 3.0000 mL | Freq: Two times a day (BID) | INTRAVENOUS | Status: DC

## 2018-04-24 MED ORDER — HEPARIN (PORCINE) IN NACL 1000-0.9 UT/500ML-% IV SOLN
INTRAVENOUS | Status: AC
  Filled 2018-04-24: qty 1000

## 2018-04-24 MED ORDER — VERAPAMIL HCL 2.5 MG/ML IV SOLN
INTRAVENOUS | Status: AC
  Filled 2018-04-24: qty 2

## 2018-04-24 MED ORDER — SODIUM CHLORIDE 0.9% FLUSH: 3.0000 mL | INTRAVENOUS | Status: DC | PRN

## 2018-04-24 MED ORDER — HEPARIN SODIUM (PORCINE) 1000 UNIT/ML IJ SOLN
INTRAMUSCULAR | Status: AC
  Filled 2018-04-24: qty 1

## 2018-04-24 MED ORDER — TAMSULOSIN HCL 0.4 MG PO CAPS
0.4000 mg | ORAL_CAPSULE | Freq: Every day | ORAL | Status: DC
  Administered 2018-04-24 – 2018-05-03 (×9): 0.4 mg via ORAL
  Filled 2018-04-24 (×9): qty 1

## 2018-04-24 MED ORDER — GABAPENTIN 100 MG PO CAPS: 100.0000 mg | ORAL_CAPSULE | Freq: Three times a day (TID) | ORAL | Status: DC

## 2018-04-24 MED ORDER — LIDOCAINE HCL (PF) 1 % IJ SOLN
INTRAMUSCULAR | Status: DC | PRN
  Administered 2018-04-24: 2 mL

## 2018-04-24 MED ORDER — HEPARIN SODIUM (PORCINE) 5000 UNIT/ML IJ SOLN
5000.0000 [IU] | Freq: Three times a day (TID) | INTRAMUSCULAR | Status: DC
  Administered 2018-04-24 – 2018-04-26 (×5): 5000 [IU] via SUBCUTANEOUS
  Filled 2018-04-24 (×5): qty 1

## 2018-04-24 SURGICAL SUPPLY — 9 items

## 2018-04-24 NOTE — Interval H&P Note (Signed)
Cath Lab Visit (complete for each Cath Lab visit)  Clinical Evaluation Leading to the Procedure:   ACS: No.  Non-ACS:    Anginal Classification: CCS III  Anti-ischemic medical therapy: Minimal Therapy (1 class of medications)  Non-Invasive Test Results: No non-invasive testing performed  Prior CABG: No previous CABG      History and Physical Interval Note:  04/24/2018 7:34 AM  [name]  has presented today for surgery, with the diagnosis of cp  The various methods of treatment have been discussed with the patient and family. After consideration of risks, benefits and other options for treatment, the patient has consented to  Procedure(s): LEFT HEART CATH AND CORONARY ANGIOGRAPHY (N/A) as a surgical intervention .  The patient's history has been reviewed, patient examined, no change in status, stable for surgery.  I have reviewed the patient's chart and labs.  Questions were answered to the patient's satisfaction.     Larae Grooms

## 2018-04-24 NOTE — Consult Note (Addendum)
[address]       ,[place] [address]             [phone]        [name] [facility] Medical Record #[id number] Date of Birth: 08/24/1951  Referring: No ref. provider found Primary Care: Kathyrn Drown, MD Primary Cardiologist:Peter Johnsie Cancel, MD  Chief Complaint:  Chest pain with exertion  History of Present Illness:      This is a 67 year old male patient with a past medical history of CAD with stents in the circumflex, RCA, and LAD. The last stent placement was in 2012. His initial stents were placed in 2003. He also have severe arthritis in his left knee, chronic cervical and lumbar back pain, essential hypertension, hyperlipidemia, hypothyroidism, and thrombocytopenia. He was seen in the ED on 04/17/2018 for chest pain and dyspnea. The patient was lifting a heavy grill and developed chest pain which started as a dull ache and radiated tot he left arm. The pain was thought to be musculoskeletal and he was discharged home with flexeril. He reported to the Cardiologist office on 04/19/2018 and was seen by Dr. Johnsie Cancel. He was concerned that the pain was more exertional and described it as chest tightness. He also had run out of his sublingual nitroglycerin. Cardiac cath was performed which showed severe three vessel disease. We were consulted for possible surgical intervention. He is currently chest pain free.   Current Activity/ Functional Status: Patient was independent with mobility/ambulation, transfers, ADL's, IADL's.   Zubrod Score: At the time of surgery this patient's most appropriate activity status/level should be described as: []     0    Normal activity, no symptoms [x]     1    Restricted in physical strenuous activity but ambulatory, able to do out light work []     2    Ambulatory and capable of self care, unable to do work activities, up and about                 more than 50%  Of the time                            []     3    Only limited self  care, in bed greater than 50% of waking hours []     4    Completely disabled, no self care, confined to bed or chair []     5    Moribund  Past Medical History:  Diagnosis Date  . Allergy   . Carpal tunnel syndrome   . Chronic back pain   . Coronary artery disease   . Degenerative disc disease   . GERD (gastroesophageal reflux disease)   . History of thrombocytopenia   . Hypercholesteremia   . Hypertension   . Hypothyroid   . Lumbar pain   . Myocardial infarction ([place]) 2009  . Shortness of breath dyspnea     Past Surgical History:  Procedure Laterality Date  . BACK SURGERY     5 lumbas disc with cervical and lumbar fusions  . BIOPSY N/A 03/06/2014   Procedure: ESOPHAGEAL BIOPSIES;  Surgeon: [name], MD;  Location: [facility];  Service: Endoscopy;  Laterality: N/A;  . COLONOSCOPY    . COLONOSCOPY WITH PROPOFOL N/A 03/06/2014   Procedure: COLONOSCOPY WITH PROPOFOL;  Surgeon: [name], MD;  Location: [facility];  Service: Endoscopy;  Laterality: N/A;  in cecum at 0807; total withdrawal time 9 minutes  . CORONARY ANGIOPLASTY WITH STENT PLACEMENT     2000, and 2004 has 3 stents  . ESOPHAGOGASTRODUODENOSCOPY (EGD) WITH PROPOFOL N/A 03/06/2014   Procedure: ESOPHAGOGASTRODUODENOSCOPY (EGD) WITH PROPOFOL;  Surgeon: [name], MD;  Location: [facility];  Service: Endoscopy;  Laterality: N/A;  . LUMBAR FUSION  2009  . MALONEY DILATION N/A 03/06/2014   Procedure: MALONEY DILATION 54 french;  Surgeon: [name], MD;  Location: [facility];  Service: Endoscopy;  Laterality: N/A;  . NECK SURGERY      Social History   Tobacco Use  Smoking Status Former Smoker  . Packs/day: 2.00  . Years: 20.00  . Pack years: 40.00  . Types: Cigarettes  . Last attempt to quit: 07/06/1991  . Years since quitting: 26.8  Smokeless Tobacco Never Used    Social History   Substance and Sexual Activity  Alcohol Use No   Comment: occassional     Allergies  Allergen Reactions  . Penicillins Other  (See Comments)    Syncopal episode Has patient had a PCN reaction causing immediate rash, facial/tongue/throat swelling, SOB or lightheadedness with hypotension:No Has patient had a PCN reaction causing severe rash involving mucus membranes or skin necrosis:No Has patient had a PCN reaction that required hospitalization:No Has patient had a PCN reaction occurring within the last 10 years:Yes If all of the above answers are "NO", then may proceed with Cephalosporin use.   . Sulfa Antibiotics Swelling  . Zoloft [Sertraline Hcl] Other (See Comments)    Confusion     Current Facility-Administered Medications  Medication Dose Route Frequency Provider Last Rate Last Dose  . 0.9 %  sodium chloride infusion  250 mL Intravenous PRN Larae Grooms S, MD      . 0.9 %  sodium chloride infusion   Intravenous Continuous [name], MD 80 mL/hr at 04/24/18 [phone]    . 0.9% sodium chloride infusion  1 mL/kg/hr Intravenous Continuous [name], MD 126 mL/hr at 04/24/18 0710 1 mL/kg/hr at 04/24/18 0710  . aspirin chewable tablet 81 mg  81 mg Oral Pre-Cath [place], [place] S, MD      . sodium chloride flush (NS) 0.9 % injection 3 mL  3 mL Intravenous Q12H Larae Grooms S, MD      . sodium chloride flush (NS) 0.9 % injection 3 mL  3 mL Intravenous PRN [name], MD        Medications Prior to Admission  Medication Sig Dispense Refill Last Dose  . atorvastatin (LIPITOR) 80 MG tablet Take 1 tablet (80 mg total) by mouth daily. 90 tablet 0 04/23/2018 at 1400  . cetirizine (ZYRTEC) 10 MG tablet Take 10 mg by mouth at bedtime.     04/23/2018 at 1400  . citalopram (CELEXA) 20 MG tablet TAKE 1 TABLET BY MOUTH EVERY MORNING. (Patient taking differently: TAKE 1 TABLET(20mg ) BY MOUTH EVERY MORNING.) 90 tablet 1 04/23/2018 at 1400  . doxazosin (CARDURA) 4 MG tablet TAKE 1 TABLET BY MOUTH EVERY MORNING. (Patient taking differently: TAKE 1 TABLET(4mg ) BY MOUTH EVERY MORNING.) 90  tablet 1 04/23/2018 at 1400  . levothyroxine (SYNTHROID, LEVOTHROID) 112 MCG tablet TAKE 1 TABLET BY MOUTH DAILY BEFORE BREAKFAST. (Patient taking differently: TAKE 1 TABLET(153mcg) BY MOUTH DAILY BEFORE BREAKFAST.) 90 tablet 0 04/23/2018 at 1400  . metoprolol tartrate (LOPRESSOR) 50 MG tablet TAKE 1 TABLET BY MOUTH TWICE DAILY. (Patient taking differently: TAKE 1 TABLET(50mg ) BY  MOUTH TWICE DAILY.) 60 tablet 5 04/23/2018 at 1400  . Multiple Vitamin (MULTIVITAMIN) capsule Take 1 capsule by mouth daily.     04/23/2018 at 1400  . pantoprazole (PROTONIX) 40 MG tablet TAKE 1 TABLET BY MOUTH ONCE A DAY. (Patient taking differently: TAKE 1 TABLET(40mg ) BY MOUTH ONCE A DAY.) 90 tablet 1 04/23/2018 at 1400  . potassium chloride SA (K-DUR,KLOR-CON) 20 MEQ tablet Take 40 mEq by mouth daily.   04/23/2018 at 1400  . tamsulosin (FLOMAX) 0.4 MG CAPS capsule TAKE 1 CAPSULE BY MOUTH DAILY. (Patient taking differently: TAKE 1 CAPSULE (0.4mg ) BY MOUTH DAILY.) 30 capsule 5 04/23/2018 at 1400  . torsemide (DEMADEX) 20 MG tablet Take 40 mg by mouth 2 (two) times daily.    04/23/2018 at 1400  . albuterol (PROVENTIL HFA;VENTOLIN HFA) 108 (90 Base) MCG/ACT inhaler Inhale 2 puffs into the lungs every 4 (four) hours as needed for wheezing or shortness of breath. 1 Inhaler 2 More than a month at Unknown time  . cyclobenzaprine (FLEXERIL) 5 MG tablet Take 1 tablet (5 mg total) by mouth 3 (three) times daily as needed. 10 tablet 0 More than a month at Unknown time  . gabapentin (NEURONTIN) 100 MG capsule Start with taking one capsule in the evening for the next four days,then one capsule BID for the following four days,then one capsule three times per day. (Patient taking differently: Take 100 mg by mouth 3 (three) times daily. ) 90 capsule 2 not taking  . nitroGLYCERIN (NITROSTAT) 0.4 MG SL tablet Place 1 tablet (0.4 mg total) under the tongue every 5 (five) minutes as needed for chest pain. 10 tablet 2 04/21/2018    Family History    Problem Relation Age of Onset  . Heart attack Father      Review of Systems:   Review of Systems  Constitutional: Positive for malaise/fatigue. Negative for chills, diaphoresis and fever.  HENT: Negative.   Respiratory: Positive for shortness of breath. Negative for cough, sputum production and wheezing.   Cardiovascular: Positive for chest pain. Negative for palpitations and leg swelling.  Gastrointestinal: Negative for nausea and vomiting.  Musculoskeletal: Positive for back pain, joint pain and myalgias.  Neurological: Negative.   Psychiatric/Behavioral: Negative.    Pertinent items are noted in HPI.      Physical Exam: BP (!) 169/68   Pulse 63   Temp 98.2 F (36.8 C) (Oral)   Resp 20   Ht 5\' 11"  (1.803 m)   Wt 123.4 kg (272 lb)   SpO2 99%   BMI 37.94 kg/m    General appearance: alert, cooperative and no distress Resp: clear to auscultation bilaterally Cardio: regular rate and rhythm, S1, S2 normal, no murmur, click, rub or gallop GI: soft, non-tender; bowel sounds normal; no masses,  no organomegaly Extremities: extremities normal, atraumatic, no cyanosis or edema Neurologic: Grossly normal  Diagnostic Studies & Laboratory data:    Recent Radiology Findings:      CHEST - 2 VIEW  COMPARISON:  Chest x-ray 05/25/2015.  FINDINGS: Mediastinum hilar structures normal. Lungs are clear. No pleural effusion pneumothorax. Cardiac stents noted. Stable cardiomegaly with normal pulmonary vascularity. Degenerative change thoracic spine. Prior cervicothoracic spine fusion.  IMPRESSION: 1. Cardiac stents noted. Cardiomegaly with normal pulmonary vascularity. Heart size is stable from 05/25/2015.  2.  No acute pulmonary disease.   Electronically Signed   By: [place]   On: 04/17/2018 12:49   LEFT HEART CATH AND CORONARY ANGIOGRAPHY  Conclusion  Prox RCA lesion is 95% stenosed.  Dist RCA-2 lesion is 100% stenosed.  Dist RCA-1 lesion  is 99% stenosed.  Ost 1st Mrg lesion is 100% stenosed.  Lat 1st Mrg lesion is 100% stenosed.  Ost LAD to Prox LAD lesion is 50% stenosed.  Mid LAD-1 lesion is 75% stenosed. This is seen best in teh LAO caudal view.  Mid LAD-2 lesion is 50% stenosed.  There is mild left ventricular systolic dysfunction.  The left ventricular ejection fraction is 45-50% by visual estimate.  LV end diastolic pressure is normal.  There is no aortic valve stenosis.   Severe three vessel disease including occlusion of both prior stented segments.  De novo LAD lesion, beset seen in the LAD caudal view, and clearly worse from prior cath.  Cardiac surgery consult.    Will admit since he reports pain at rest.  Will hold off on heparin for now given prior platelet issues.  May need to start if pain occurs.    Indications   Angina pectoris ([place]) [I20.9 (ICD-10-CM)]  Procedural Details/Technique   Technical Details The risks, benefits, and details of the procedure were explained to the patient. The patient verbalized understanding and wanted to proceed. Informed written consent was obtained.  PROCEDURE TECHNIQUE: After Xylocaine anesthesia a 5F slender sheath was placed in the right radial artery with a single anterior needle wall stick. IV Heparin was given. Left ventriculography was done using a JR4 catheter. Right coronary angiography was done using a Judkins R4 guide catheter. Left coronary angiography was done using a Judkins L3.5 guide catheter. A TR band was used for hemostasis.  Contrast: 60 cc     Estimated blood loss <50 mL.  During this procedure the patient was administered the following to achieve and maintain moderate conscious sedation: Versed 3 mg, Fentanyl 100 mcg, while the patient's heart rate, blood pressure, and oxygen saturation were continuously monitored. The period of conscious sedation was 25 minutes, of which I was present face-to-face 100% of this time.  Complications    Complications documented before study signed (04/24/2018 1:12 PM EDT)    No complications were associated with this study.  Documented by [name], MD - 04/24/2018 8:23 AM EDT    Coronary Findings   Diagnostic  Dominance: Right  Left Anterior Descending  Ost LAD to Prox LAD lesion 50% stenosed  Ost LAD to Prox LAD lesion is 50% stenosed.  Mid LAD-1 lesion 75% stenosed  Mid LAD-1 lesion is 75% stenosed. The lesion is eccentric. The lesion is calcified.  Mid LAD-2 lesion 50% stenosed  Mid LAD-2 lesion is 50% stenosed.  Left Circumflex  First Obtuse Marginal Branch  Ost 1st Mrg lesion 100% stenosed  Ost 1st Mrg lesion is 100% stenosed.  Lateral First Obtuse Marginal Branch  Collaterals  Lat 1st Mrg filled by collaterals from 2nd Diag.    Lat 1st Mrg lesion 100% stenosed  Lat 1st Mrg lesion is 100% stenosed. The lesion was previously treated.  Right Coronary Artery  Prox RCA lesion 95% stenosed  Prox RCA lesion is 95% stenosed.  Dist RCA-1 lesion 99% stenosed  Dist RCA-1 lesion is 99% stenosed.  Dist RCA-2 lesion 100% stenosed  Dist RCA-2 lesion is 100% stenosed. The lesion was previously treated.  Right Posterior Descending Artery  Collaterals  RPDA filled by collaterals from 1st Sept.    Intervention   No interventions have been documented.  Wall Motion   Resting  Left Heart   Left Ventricle The left ventricular size is in the upper limits of normal. There is mild left ventricular systolic dysfunction. LV end diastolic pressure is normal. The left ventricular ejection fraction is 45-50% by visual estimate. There are LV function abnormalities due to segmental dysfunction.  Aortic Valve There is no aortic valve stenosis.  Coronary Diagrams   Diagnostic Diagram       Implants    No implant documentation for this case.  MERGE Images   Show images for CARDIAC CATHETERIZATION   Link to Procedure Log   Procedure Log    Hemo Data     Most Recent Value  AO Systolic Pressure 269 mmHg  AO Diastolic Pressure 77 mmHg  AO Mean 485 mmHg  LV Systolic Pressure 462 mmHg  LV Diastolic Pressure 4 mmHg  LV EDP 13 mmHg  Arterial Occlusion Pressure Extended Systolic Pressure 703 mmHg  Arterial Occlusion Pressure Extended Diastolic Pressure 70 mmHg  Arterial Occlusion Pressure Extended Mean Pressure 99 mmHg  Left Ventricular Apex Extended Systolic Pressure 500 mmHg  Left Ventricular Apex Extended Diastolic Pressure 6 mmHg  Left Ventricular Apex Extended EDP Pressure 14 mmHg      I have independently reviewed the above radiologic studies and discussed with the patient   Recent Lab Findings: Lab Results  Component Value Date   WBC 8.4 04/19/2018   HGB 14.7 04/19/2018   HCT 44.6 04/19/2018   PLT 161 04/19/2018   GLUCOSE 127 (H) 04/19/2018   CHOL 213 (H) 06/06/2016   TRIG 71 06/06/2016   HDL 43 06/06/2016   LDLCALC 156 (H) 06/06/2016   ALT 16 (L) 04/28/2017   AST 20 04/28/2017   NA 140 04/19/2018   K 3.5 04/19/2018   CL 102 04/19/2018   CREATININE 1.02 04/19/2018   BUN 20 04/19/2018   CO2 28 04/19/2018   TSH 4.410 06/06/2016   INR 1.15 05/26/2015   HGBA1C 5.2 02/03/2017      Assessment / Plan:    1. Multivessel CAD-Possible coronary artery bypass grafting with Dr. Roxy Manns on Friday 04/27/2018. Continue medical management per cardiology. Nitro sublingual for chest pain. Currently the patient is pain free.  2. Hypertension-continue ASA, lipitor, and lopressor 3. Hypothyroid-continue home synthroid 4. Hypercholesteremia- continue Lipitor 5. Chronic back pain/ cervical pain-continue current care. May need to consult PT early on since mobility will be an issue post-op  Plan: procedure explained in detail to the patient and wife at bedside. All questions were answered to their satisfaction. Tentitive plan for CABG on Friday 6/28.    Nicholes Rough, PA-C  04/24/2018 9:29 AM   I have personally seen and examined  the patient and reviewed the patient's history and diagnostic cardiac catheterization.  I agree with the assessment as outlined above by Nicholes Rough, PA-C  Patient is a 67 year old obese white male with long-standing history of coronary artery disease status post acute myocardial infarction in the distant past and status post multiple previous PCI and stent procedures, hypertension, hyperlipidemia, ITP, chronic back pain for which the patient is followed in the pain clinic, limited mobility, and hypothyroidism who describes a 1 year history of symptoms of exertional shortness of breath and lower extremity edema and more recent development of symptoms of exertional substernal chest pain consistent with angina pectoris.  Catheterization revealed severe three-vessel coronary artery disease.  There is 100% occlusion of the large bifurcating first obtuse marginal branch of the left circumflex coronary artery that had been site of multiple previous  stent procedures in the past.  There is 100% occlusion of the right coronary artery, also site of previous stent procedures in the past.  There is diffuse disease in the left anterior descending coronary artery.  Most of the blockages in the left anterior descending coronary artery are less than or equal to 50%.  There is one area in the mid to distal portion of the vessel it appears perhaps 75% stenosed and a focal area.  Distal target vessels in the left circumflex and right coronary artery distributions appear extremely poor and barely fill at all via collaterals.  The patient may or may not have adequate distal target vessels in these distribution for surgical revascularization.  Moreover, 1 of the patient's previous PCI and stent procedures performed many years ago was complicated by perforation of coronary artery with hemopericardium that did not require surgical drainage.  I suspect the patient may have considerable scar tissue and adhesions within his pericardial  space, further complicating any attempted surgical revascularization.  Options include continued medical therapy versus high risk coronary artery bypass grafting versus PCI and stenting of the focal stenosis in the left anterior descending coronary artery.  Risks associated with surgery will be high because of the patient's comorbid medical problems with significant physical deconditioning and in particular the relatively poor target vessels for grafting with considerable risk of incomplete revascularization and possible perioperative myocardial infarction and/or premature graft closure.    I have discussed results of the patient's diagnostic cardiac catheterization and treatment options at length with the patient and his family at the bedside this evening.  We plan to discuss treatment options further with Dr. Johnsie Cancel and Dr. Irish Lack with particular reference to whether or not the left anterior descending coronary artery stenosis is felt to be flow-limiting and therefore a more compelling reason to proceed with coronary artery bypass grafting.  If all agree that the patient should proceed with surgery rather than an attempt at long-term medical therapy we will tentatively plan for surgery on Friday, April 27, 2018.  During the interim we will ask for physical therapy consultation to assess the patient's baseline physical limitations and anticipate need for therapy following surgery.   I spent in excess of 120 minutes during the conduct of this hospital encounter and >50% of this time involved direct face-to-face encounter with the patient for counseling and/or coordination of their care.   [name], MD 04/24/2018 7:00 PM

## 2018-04-25 ENCOUNTER — Inpatient Hospital Stay (HOSPITAL_COMMUNITY): Payer: Medicare Other

## 2018-04-25 DIAGNOSIS — I351 Nonrheumatic aortic (valve) insufficiency: Secondary | ICD-10-CM

## 2018-04-25 DIAGNOSIS — I209 Angina pectoris, unspecified: Secondary | ICD-10-CM

## 2018-04-25 LAB — COMPREHENSIVE METABOLIC PANEL
ALK PHOS: 75 U/L (ref 38–126)
ALT: 12 U/L (ref 0–44)
AST: 13 U/L — AB (ref 15–41)
Albumin: 3.4 g/dL — ABNORMAL LOW (ref 3.5–5.0)
Anion gap: 10 (ref 5–15)
BUN: 14 mg/dL (ref 8–23)
CO2: 28 mmol/L (ref 22–32)
CREATININE: 0.98 mg/dL (ref 0.61–1.24)
Calcium: 8.5 mg/dL — ABNORMAL LOW (ref 8.9–10.3)
Chloride: 103 mmol/L (ref 98–111)
GFR calc Af Amer: >60 mL/min (ref >60)
GFR calc non Af Amer: >60 mL/min (ref >60)
Glucose, Bld: 90 mg/dL (ref 70–99)
Potassium: 3.5 mmol/L (ref 3.5–5.1)
SODIUM: 141 mmol/L (ref 135–145)
Total Bilirubin: 1.1 mg/dL (ref 0.3–1.2)
Total Protein: 5.7 g/dL — ABNORMAL LOW (ref 6.5–8.1)

## 2018-04-25 LAB — ECHOCARDIOGRAM COMPLETE
HEIGHTINCHES: 71 in
WEIGHTICAEL: 4352 [oz_av]

## 2018-04-25 LAB — PULMONARY FUNCTION TEST
DL/VA % PRED: 111 %
DL/VA: 5.2 ml/min/mmHg/L
DLCO COR % PRED: 83 %
DLCO COR: 28.18 ml/min/mmHg
DLCO UNC % PRED: 77 %
DLCO unc: 26.16 ml/min/mmHg
FEF 25-75 POST: 2.46 L/s
FEF 25-75 Pre: 2.22 L/sec
FEF2575-%CHANGE-POST: 10 %
FEF2575-%PRED-PRE: 82 %
FEF2575-%Pred-Post: 90 %
FEV1-%Change-Post: 3 %
FEV1-%PRED-POST: 84 %
FEV1-%Pred-Pre: 81 %
FEV1-Post: 2.93 L
FEV1-Pre: 2.84 L
FEV1FVC-%CHANGE-POST: 3 %
FEV1FVC-%PRED-PRE: 99 %
FEV6-%Change-Post: 0 %
FEV6-%PRED-PRE: 86 %
FEV6-%Pred-Post: 86 %
FEV6-Post: 3.84 L
FEV6-Pre: 3.84 L
FEV6FVC-%Change-Post: 0 %
FEV6FVC-%Pred-Post: 105 %
FEV6FVC-%Pred-Pre: 105 %
FVC-%Change-Post: 0 %
FVC-%PRED-POST: 81 %
FVC-%PRED-PRE: 81 %
FVC-POST: 3.84 L
FVC-PRE: 3.84 L
PRE FEV1/FVC RATIO: 74 %
Post FEV1/FVC ratio: 76 %
Post FEV6/FVC ratio: 100 %
Pre FEV6/FVC Ratio: 100 %
RV % pred: 117 %
RV: 2.87 L
TLC % pred: 89 %
TLC: 6.48 L

## 2018-04-25 LAB — LIPID PANEL
CHOL/HDL RATIO: 5.7 ratio
Cholesterol: 149 mg/dL (ref 0–200)
HDL: 26 mg/dL — ABNORMAL LOW (ref >40)
LDL Cholesterol: 98 mg/dL (ref 0–99)
Triglycerides: 125 mg/dL (ref <150)
VLDL: 25 mg/dL (ref 0–40)

## 2018-04-25 LAB — CBC
HCT: 38.3 % — ABNORMAL LOW (ref 39.0–52.0)
Hemoglobin: 12.3 g/dL — ABNORMAL LOW (ref 13.0–17.0)
MCH: 29.9 pg (ref 26.0–34.0)
MCHC: 32.1 g/dL (ref 30.0–36.0)
MCV: 93.2 fL (ref 78.0–100.0)
PLATELETS: 118 10*3/uL — AB (ref 150–400)
RBC: 4.11 MIL/uL — AB (ref 4.22–5.81)
RDW: 13.9 % (ref 11.5–15.5)
WBC: 6.1 10*3/uL (ref 4.0–10.5)

## 2018-04-25 LAB — PROTIME-INR
INR: 1.03
PROTHROMBIN TIME: 13.5 s (ref 11.4–15.2)

## 2018-04-25 LAB — HEMOGLOBIN A1C
Hgb A1c MFr Bld: 5.6 % (ref 4.8–5.6)
MEAN PLASMA GLUCOSE: 114.02 mg/dL

## 2018-04-25 LAB — TROPONIN I

## 2018-04-25 LAB — TYPE AND SCREEN
ABO/RH(D): O POS
Antibody Screen: NEGATIVE

## 2018-04-25 LAB — APTT: aPTT: 30 seconds (ref 24–36)

## 2018-04-25 LAB — BRAIN NATRIURETIC PEPTIDE: B Natriuretic Peptide: 152.8 pg/mL — ABNORMAL HIGH (ref 0.0–100.0)

## 2018-04-25 LAB — PREALBUMIN: PREALBUMIN: 25.9 mg/dL (ref 18–38)

## 2018-04-25 MED ORDER — ALBUTEROL SULFATE (2.5 MG/3ML) 0.083% IN NEBU
2.5000 mg | INHALATION_SOLUTION | Freq: Once | RESPIRATORY_TRACT | Status: AC
  Administered 2018-04-25: 2.5 mg via RESPIRATORY_TRACT

## 2018-04-25 MED FILL — Heparin Sod (Porcine)-NaCl IV Soln 1000 Unit/500ML-0.9%: INTRAVENOUS | Qty: 1000 | Status: AC

## 2018-04-25 NOTE — Progress Notes (Signed)
[id number] Pt gone for test. Wife in room. Gave wife OHS booklet and care guide. Discussed importance of IS and mobility after surgery. Left IS in room. Will review with pt next visit. Gave IN THE TUBE handout. Wrote down how to view pre op video. Wife will be available 24/7 after discharge. Will follow up with pt tomorrow. Graylon Good RN BSN 04/25/2018 2:26 PM

## 2018-04-25 NOTE — Progress Notes (Signed)
Progress Note  Patient Name: [name] Date of Encounter: 04/25/2018  Primary Cardiologist: Jenkins Rouge, MD   Subjective   No chest pain Thankful for care   Inpatient Medications    Scheduled Meds: . aspirin  81 mg Oral Daily  . atorvastatin  80 mg Oral q1800  . citalopram  20 mg Oral q morning - 10a  . doxazosin  4 mg Oral q morning - 10a  . heparin  5,000 Units Subcutaneous Q8H  . levothyroxine  112 mcg Oral QAC breakfast  . loratadine  10 mg Oral Daily  . metoprolol tartrate  50 mg Oral BID  . pantoprazole  40 mg Oral Daily  . sodium chloride flush  3 mL Intravenous Q12H  . tamsulosin  0.4 mg Oral Daily   Continuous Infusions: . sodium chloride     PRN Meds: sodium chloride, acetaminophen, albuterol, morphine injection, nitroGLYCERIN, ondansetron (ZOFRAN) IV, sodium chloride flush   Vital Signs    Vitals:   04/24/18 1946 04/24/18 2149 04/24/18 2313 04/25/18 0325  BP: (!) 142/78 (!) 146/84  (!) 141/86  Pulse:  64    Resp: 18 14 (!) 21 17  Temp: 97.9 F (36.6 C)  98.4 F (36.9 C) 97.6 F (36.4 C)  TempSrc: Oral  Oral Oral  SpO2: 98%   98%  Weight:      Height:        Intake/Output Summary (Last 24 hours) at 04/25/2018 0803 Last data filed at 04/24/2018 1837 Gross per 24 hour  Intake 1340.28 ml  Output -  Net 1340.28 ml   Filed Weights   04/24/18 0549  Weight: 272 lb (123.4 kg)    Telemetry    NSR 04/25/2018  - Personally Reviewed  ECG    NSR no acute ST changes  - Personally Reviewed  Physical Exam  Poor dentition  GEN: No acute distress.   Neck: No JVD Cardiac: RRR, no murmurs, rubs, or gallops.  Respiratory: Clear to auscultation bilaterally. GI: Soft, nontender, non-distended  MS: No edema; No deformity. Neuro:  Nonfocal  Psych: Normal affect   Labs    Chemistry Recent Labs  Lab 04/19/18 1122 04/24/18 1033 04/25/18 0358  NA 140  --  141  K 3.5  --  3.5  CL 102  --  103  CO2 28  --  28  GLUCOSE 127*  --  90  BUN 20   --  14  CREATININE 1.02 1.03 0.98  CALCIUM 8.9  --  8.5*  PROT  --   --  5.7*  ALBUMIN  --   --  3.4*  AST  --   --  13*  ALT  --   --  12  ALKPHOS  --   --  75  BILITOT  --   --  1.1  GFRNONAA >60 >60 >60  GFRAA >60 >60 >60  ANIONGAP 10  --  10     Hematology Recent Labs  Lab 04/19/18 1122 04/24/18 1033 04/25/18 0358  WBC 8.4 7.0 6.1  RBC 4.87 4.61 4.11*  HGB 14.7 13.7 12.3*  HCT 44.6 42.3 38.3*  MCV 91.6 91.8 93.2  MCH 30.2 29.7 29.9  MCHC 33.0 32.4 32.1  RDW 13.9 14.0 13.9  PLT 161 127* 118*    Cardiac Enzymes Recent Labs  Lab 04/25/18 0358  TROPONINI <0.03   No results for input(s): TROPIPOC in the last 168 hours.   BNP Recent Labs  Lab 04/25/18 0358  BNP  152.8*     DDimer No results for input(s): DDIMER in the last 168 hours.   Radiology    No results found.  Cardiac Studies   Cath with occluded OM stent  Occluded distal RCA with some collaterals and 75% Mid LAD  Patient Profile     67 y.o. male with unstable angina and severe 3VD. Poor distal targets in circumflex And RCA territories EF 45-50%  Assessment & Plan    Unstable Angina: reviewed films Feel he would benefit even with just LIMA if no vein grafts Can be placed to distal RCA or circumflex. He has ITP and long term DAT with long area Of stenting in mid LAD would be sub-optimal Has had high PRU's on plavix and would need Brilinta or Effient. Has shown [place] he occludes long stented areas in OM  Hopefully CABG Friday with Dr Roxy Manns  ITP:  PLT stable no bleeding issues at cath sight PLT count 118  For questions or updates, please contact [facility] Please consult [email or web address] for contact info under Cardiology/STEMI.      Signed, Jenkins Rouge, MD  04/25/2018, 8:03 AM

## 2018-04-25 NOTE — Progress Notes (Signed)
  Echocardiogram 2D Echocardiogram has been performed.  [name] [name] [name] 04/25/2018, 12:09 PM

## 2018-04-25 NOTE — Progress Notes (Signed)
[facility] BRIEF PROGRESS NOTE   We tentatively plan CABG on Friday  Oscar Alberts, MD 04/25/2018 6:50 PM

## 2018-04-25 NOTE — Evaluation (Signed)
Physical Therapy Evaluation Patient Details Name: [name] MRN: [id number] DOB: 1951/09/26 Today's Date: 04/25/2018   History of Present Illness  This is a 67 year old male patient with a past medical history of CAD with stents in the circumflex, RCA, and LAD. PMH:  severe arthritis in his left knee, chronic cervical and lumbar back pain, essential hypertension, hyperlipidemia, hypothyroidism, and thrombocytopenia. He was seen in the ED on 04/17/2018 for chest pain and dyspnea. The patient was lifting a heavy grill and developed chest pain which started as a dull ache and radiated tot he left arm. The pain was thought to be musculoskeletal and he was discharged home with flexeril. He reported to the Cardiologist office on 04/19/2018 and was seen by Dr. Johnsie Cancel. He was concerned that the pain was more exertional and described it as chest tightness. He also had run out of his sublingual nitroglycerin. Cardiac cath was performed which showed severe three vessel disease. We were consulted for possible surgical intervention.   Clinical Impression  Pt admitted for above. Pt functioning at baseline currently but is planned to have CABGx3 on Friday 6/28. Began education on sternal precautions and transfer techniques. PT to return after pt undergoes surgery for re-evaluation of mobility and discharge planning.    Follow Up Recommendations No PT follow up(will re-address s/p CABG on friday)    Equipment Recommendations  None recommended by PT    Recommendations for Other Services       Precautions / Restrictions Precautions Precautions: Fall Restrictions Weight Bearing Restrictions: No      Mobility  Bed Mobility               General bed mobility comments: pt sitting EOB upon PT arrival  Transfers Overall transfer level: Needs assistance Equipment used: Rolling walker (2 wheeled) Transfers: Sit to/from Stand Sit to Stand: Supervision         General transfer comment: pt  practiced getting up with hands on his knees and did it well  Ambulation/Gait Ambulation/Gait assistance: Min guard Gait Distance (Feet): 350 Feet Assistive device: Rolling walker (2 wheeled) Gait Pattern/deviations: Step-through pattern;Decreased stride length;Trunk flexed Gait velocity: fair Gait velocity interpretation: 1.31 - 2.62 ft/sec, indicative of limited community ambulator General Gait Details: pt with increased trunk flexion requiring verbal cues to stand up straight, contract abdominal muscles and stay in walker to decrease UE WBing in prep for post surgical precautions  Stairs            Wheelchair Mobility    Modified Rankin (Stroke Patients Only)       Balance Overall balance assessment: Mild deficits observed, not formally tested                                           Pertinent Vitals/Pain Pain Assessment: 0-10 Pain Score: 6  Pain Location: back Pain Descriptors / Indicators: Constant;Aching Pain Intervention(s): Monitored during session;Patient requesting pain meds-RN notified    Home Living Family/patient expects to be discharged to:: Private residence Living Arrangements: Spouse/significant other Available Help at Discharge: Family;Available 24 hours/day Type of Home: House Home Access: Stairs to enter Entrance Stairs-Rails: None Entrance Stairs-Number of Steps: 3 Home Layout: One level Home Equipment: Walker - 4 wheels;Shower seat - built in      Prior Function Level of Independence: Independent with assistive device(s)         Comments:  uses rollator for long distance ambulation, uses shower seat in shower     Hand Dominance   Dominant Hand: Right    Extremity/Trunk Assessment   Upper Extremity Assessment Upper Extremity Assessment: Overall WFL for tasks assessed    Lower Extremity Assessment Lower Extremity Assessment: RLE deficits/detail RLE Deficits / Details: decreased quad and hamstring strength due  to increased pain in knee    Cervical / Trunk Assessment Cervical / Trunk Assessment: Normal  Communication   Communication: No difficulties  Cognition Arousal/Alertness: Awake/alert Behavior During Therapy: Anxious(but information well recieved from PT) Overall Cognitive Status: Within Functional Limits for tasks assessed                                        General Comments      Exercises     Assessment/Plan    PT Assessment Patient needs continued PT services  PT Problem List Decreased strength;Decreased activity tolerance;Decreased balance;Decreased mobility;Decreased knowledge of use of DME       PT Treatment Interventions DME instruction;Gait training;Stair training;Functional mobility training;Therapeutic activities;Therapeutic exercise;Balance training    PT Goals (Current goals can be found in the Care Plan section)  Acute Rehab PT Goals Patient Stated Goal: to get surgery on friday PT Goal Formulation: With patient/family Time For Goal Achievement: 05/09/18 Potential to Achieve Goals: Good    Frequency Min 2X/week   Barriers to discharge        Co-evaluation               AM-PAC PT "6 Clicks" Daily Activity  Outcome Measure Difficulty turning over in bed (including adjusting bedclothes, sheets and blankets)?: None Difficulty moving from lying on back to sitting on the side of the bed? : None Difficulty sitting down on and standing up from a chair with arms (e.g., wheelchair, bedside commode, etc,.)?: None Help needed moving to and from a bed to chair (including a wheelchair)?: None Help needed walking in hospital room?: None Help needed climbing 3-5 steps with a railing? : A Little 6 Click Score: 23    End of Session Equipment Utilized During Treatment: Gait belt Activity Tolerance: Patient tolerated treatment well Patient left: in chair;with call bell/phone within reach;with nursing/sitter in room;with family/visitor  present Nurse Communication: Mobility status PT Visit Diagnosis: Unsteadiness on feet (R26.81);Difficulty in walking, not elsewhere classified (R26.2)    Time: 9163-8466 PT Time Calculation (min) (ACUTE ONLY): 15 min   Charges:   PT Evaluation $PT Eval Low Complexity: 1 Low     PT G Codes:        Kittie Plater, PT, DPT Pager #: [phone] Office #: [phone]   Argenis Kumari M Levon Boettcher 04/25/2018, 11:51 AM

## 2018-04-26 ENCOUNTER — Encounter (HOSPITAL_COMMUNITY): Payer: Medicare Other

## 2018-04-26 ENCOUNTER — Inpatient Hospital Stay (HOSPITAL_COMMUNITY): Payer: Medicare Other

## 2018-04-26 DIAGNOSIS — Z01810 Encounter for preprocedural cardiovascular examination: Secondary | ICD-10-CM

## 2018-04-26 DIAGNOSIS — D693 Immune thrombocytopenic purpura: Secondary | ICD-10-CM

## 2018-04-26 DIAGNOSIS — I25110 Atherosclerotic heart disease of native coronary artery with unstable angina pectoris: Secondary | ICD-10-CM

## 2018-04-26 LAB — HEPARIN LEVEL (UNFRACTIONATED): Heparin Unfractionated: 0.24 IU/mL — ABNORMAL LOW (ref 0.30–0.70)

## 2018-04-26 MED ORDER — PAPAVERINE HCL 30 MG/ML IJ SOLN
INTRAMUSCULAR | Status: AC
  Administered 2018-04-27: 500 mL
  Filled 2018-04-26: qty 2.5

## 2018-04-26 MED ORDER — HEPARIN SODIUM (PORCINE) 1000 UNIT/ML IJ SOLN
INTRAMUSCULAR | Status: DC
Start: 2018-04-27 — End: 2018-04-27
  Filled 2018-04-26: qty 30

## 2018-04-26 MED ORDER — NITROGLYCERIN IN D5W 200-5 MCG/ML-% IV SOLN
2.0000 ug/min | INTRAVENOUS | Status: DC
  Filled 2018-04-26: qty 250

## 2018-04-26 MED ORDER — SODIUM CHLORIDE 0.9 % IV SOLN
INTRAVENOUS | Status: AC
  Administered 2018-04-27: 1000 mL
  Filled 2018-04-26: qty 1000

## 2018-04-26 MED ORDER — SODIUM CHLORIDE 0.9 % IV SOLN
30.0000 ug/min | INTRAVENOUS | Status: AC
  Administered 2018-04-27: 20 ug/min via INTRAVENOUS
  Filled 2018-04-26: qty 2

## 2018-04-26 MED ORDER — DOPAMINE-DEXTROSE 3.2-5 MG/ML-% IV SOLN
0.0000 ug/kg/min | INTRAVENOUS | Status: DC
  Filled 2018-04-26: qty 250

## 2018-04-26 MED ORDER — TRANEXAMIC ACID 1000 MG/10ML IV SOLN
1.5000 mg/kg/h | INTRAVENOUS | Status: AC
  Administered 2018-04-27: 11:00:00 via INTRAVENOUS
  Administered 2018-04-27: 1.5 mg/kg/h via INTRAVENOUS
  Filled 2018-04-26: qty 25

## 2018-04-26 MED ORDER — CHLORHEXIDINE GLUCONATE 4 % EX LIQD
60.0000 mL | Freq: Once | CUTANEOUS | Status: AC
  Administered 2018-04-27: 4 via TOPICAL
  Filled 2018-04-26: qty 60

## 2018-04-26 MED ORDER — CHLORHEXIDINE GLUCONATE 4 % EX LIQD
60.0000 mL | Freq: Once | CUTANEOUS | Status: AC
  Administered 2018-04-26: 4 via TOPICAL

## 2018-04-26 MED ORDER — MAGNESIUM SULFATE 50 % IJ SOLN
40.0000 meq | INTRAMUSCULAR | Status: DC
Start: 2018-04-27 — End: 2018-04-27
  Filled 2018-04-26: qty 9.85

## 2018-04-26 MED ORDER — METOPROLOL TARTRATE 12.5 MG HALF TABLET
12.5000 mg | ORAL_TABLET | Freq: Once | ORAL | Status: AC
  Administered 2018-04-27: 12.5 mg via ORAL
  Filled 2018-04-26: qty 1

## 2018-04-26 MED ORDER — BISACODYL 5 MG PO TBEC
5.0000 mg | DELAYED_RELEASE_TABLET | Freq: Once | ORAL | Status: AC
  Administered 2018-04-26: 5 mg via ORAL
  Filled 2018-04-26: qty 1

## 2018-04-26 MED ORDER — CHLORHEXIDINE GLUCONATE 0.12 % MT SOLN
15.0000 mL | Freq: Once | OROMUCOSAL | Status: AC
  Administered 2018-04-27: 15 mL via OROMUCOSAL
  Filled 2018-04-26: qty 15

## 2018-04-26 MED ORDER — VANCOMYCIN HCL 10 G IV SOLR
1250.0000 mg | INTRAVENOUS | Status: AC
  Administered 2018-04-27: 1250 mg via INTRAVENOUS
  Filled 2018-04-26: qty 1250

## 2018-04-26 MED ORDER — TRANEXAMIC ACID (OHS) BOLUS VIA INFUSION
15.0000 mg/kg | INTRAVENOUS | Status: AC
  Administered 2018-04-27: 1851 mg via INTRAVENOUS
  Filled 2018-04-26: qty 1851

## 2018-04-26 MED ORDER — HEPARIN (PORCINE) IN NACL 100-0.45 UNIT/ML-% IJ SOLN
1300.0000 [IU]/h | INTRAMUSCULAR | Status: AC
  Administered 2018-04-26: 1200 [IU]/h via INTRAVENOUS
  Filled 2018-04-26: qty 250

## 2018-04-26 MED ORDER — TRANEXAMIC ACID (OHS) PUMP PRIME SOLUTION
2.0000 mg/kg | INTRAVENOUS | Status: DC
  Filled 2018-04-26: qty 2.47

## 2018-04-26 MED ORDER — LEVOFLOXACIN IN D5W 500 MG/100ML IV SOLN
500.0000 mg | INTRAVENOUS | Status: AC
  Administered 2018-04-27: 500 mg via INTRAVENOUS
  Filled 2018-04-26: qty 100

## 2018-04-26 MED ORDER — INSULIN REGULAR HUMAN 100 UNIT/ML IJ SOLN
INTRAMUSCULAR | Status: AC
  Administered 2018-04-27: 1 [IU]/h via INTRAVENOUS
  Filled 2018-04-26: qty 1

## 2018-04-26 MED ORDER — TEMAZEPAM 15 MG PO CAPS
15.0000 mg | ORAL_CAPSULE | Freq: Once | ORAL | Status: AC | PRN
  Administered 2018-04-26: 15 mg via ORAL
  Filled 2018-04-26: qty 1

## 2018-04-26 MED ORDER — EPINEPHRINE PF 1 MG/ML IJ SOLN
0.0000 ug/min | INTRAVENOUS | Status: DC
  Filled 2018-04-26: qty 4

## 2018-04-26 MED ORDER — DEXMEDETOMIDINE HCL IN NACL 400 MCG/100ML IV SOLN
0.1000 ug/kg/h | INTRAVENOUS | Status: AC
  Administered 2018-04-27: .3 ug/kg/h via INTRAVENOUS
  Administered 2018-04-27: 13:00:00 via INTRAVENOUS
  Filled 2018-04-26: qty 100

## 2018-04-26 MED ORDER — POTASSIUM CHLORIDE 2 MEQ/ML IV SOLN
80.0000 meq | INTRAVENOUS | Status: DC
  Filled 2018-04-26: qty 40

## 2018-04-26 MED ORDER — CHLORHEXIDINE GLUCONATE 4 % EX LIQD
CUTANEOUS | Status: AC
  Administered 2018-04-26: 4 via TOPICAL
  Filled 2018-04-26: qty 15

## 2018-04-26 MED ORDER — MILRINONE LACTATE IN DEXTROSE 20-5 MG/100ML-% IV SOLN
0.1250 ug/kg/min | INTRAVENOUS | Status: DC
  Filled 2018-04-26: qty 100

## 2018-04-26 NOTE — Progress Notes (Signed)
ANTICOAGULATION CONSULT NOTE - Initial Consult  Pharmacy Consult for heparin  Indication: plans for CABG  Allergies  Allergen Reactions  . Penicillins Other (See Comments)    SYNCOPAL EPISODE  Has patient had a PCN reaction causing immediate rash, facial/tongue/throat swelling, SOB or LIGHTHEADEDNESS with HYPOTENSION [SYNCOPE] #  #  #  YES  #  #  #  Has patient had a PCN reaction causing severe rash involving mucus membranes or skin necrosis:No Has patient had a PCN reaction that required hospitalization:No Has patient had a PCN reaction occurring within the last 10 years:Yes   . Sulfa Antibiotics Swelling    SWELLING REACTION UNSPECIFIED > PER PREVIOUS PMH  . Zoloft [Sertraline Hcl] Other (See Comments)    Confusion     Patient Measurements: Height: 5\' 11"  (180.3 cm) Weight: 272 lb (123.4 kg) IBW/kg (Calculated) : 75.3 Heparin Dosing Weight: 103kg  Vital Signs: Temp: 98.6 F (37 C) (06/27 0754) Temp Source: Oral (06/27 0754) BP: 141/69 (06/27 0754) Pulse Rate: 63 (06/27 0754)  Labs: Recent Labs    04/24/18 1033 04/25/18 0358  HGB 13.7 12.3*  HCT 42.3 38.3*  PLT 127* 118*  APTT  --  30  LABPROT  --  13.5  INR  --  1.03  CREATININE 1.03 0.98  TROPONINI  --  <0.03    Estimated Creatinine Clearance: 97.8 mL/min (by C-G formula based on SCr of 0.98 mg/dL).   Medical History: Past Medical History:  Diagnosis Date  . Allergy   . Carpal tunnel syndrome   . Chronic back pain   . Chronic combined systolic and diastolic congestive heart failure ([place])   . Chronic ITP (idiopathic thrombocytopenia) ([facility]) 05/25/2015  . Coronary artery disease   . Coronary artery disease involving native coronary artery of native heart with angina pectoris ([place])   . Degenerative disc disease   . GERD (gastroesophageal reflux disease)   . History of thrombocytopenia   . Hypercholesteremia   . Hypertension   . Hypothyroid   . Lumbar pain   . Myocardial infarction ([place]) 2009  .  Obesity   . Shortness of breath dyspnea     Medications:  Medications Prior to Admission  Medication Sig Dispense Refill Last Dose  . atorvastatin (LIPITOR) 80 MG tablet Take 1 tablet (80 mg total) by mouth daily. 90 tablet 0 04/23/2018 at 1400  . cetirizine (ZYRTEC) 10 MG tablet Take 10 mg by mouth at bedtime.     04/23/2018 at 1400  . citalopram (CELEXA) 20 MG tablet TAKE 1 TABLET BY MOUTH EVERY MORNING. (Patient taking differently: TAKE 1 TABLET(20mg ) BY MOUTH EVERY MORNING.) 90 tablet 1 04/23/2018 at 1400  . doxazosin (CARDURA) 4 MG tablet TAKE 1 TABLET BY MOUTH EVERY MORNING. (Patient taking differently: TAKE 1 TABLET(4mg ) BY MOUTH EVERY MORNING.) 90 tablet 1 04/23/2018 at 1400  . levothyroxine (SYNTHROID, LEVOTHROID) 112 MCG tablet TAKE 1 TABLET BY MOUTH DAILY BEFORE BREAKFAST. (Patient taking differently: TAKE 1 TABLET(189mcg) BY MOUTH DAILY BEFORE BREAKFAST.) 90 tablet 0 04/23/2018 at 1400  . metoprolol tartrate (LOPRESSOR) 50 MG tablet TAKE 1 TABLET BY MOUTH TWICE DAILY. (Patient taking differently: TAKE 1 TABLET(50mg ) BY MOUTH TWICE DAILY.) 60 tablet 5 04/23/2018 at 1400  . Multiple Vitamin (MULTIVITAMIN) capsule Take 1 capsule by mouth daily.     04/23/2018 at 1400  . pantoprazole (PROTONIX) 40 MG tablet TAKE 1 TABLET BY MOUTH ONCE A DAY. (Patient taking differently: TAKE 1 TABLET(40mg ) BY MOUTH ONCE A DAY.) 90 tablet 1 04/23/2018 at  1400  . potassium chloride SA (K-DUR,KLOR-CON) 20 MEQ tablet Take 40 mEq by mouth daily.   04/23/2018 at 1400  . tamsulosin (FLOMAX) 0.4 MG CAPS capsule TAKE 1 CAPSULE BY MOUTH DAILY. (Patient taking differently: TAKE 1 CAPSULE (0.4mg ) BY MOUTH DAILY.) 30 capsule 5 04/23/2018 at 1400  . torsemide (DEMADEX) 20 MG tablet Take 40 mg by mouth 2 (two) times daily.    04/23/2018 at 1400  . albuterol (PROVENTIL HFA;VENTOLIN HFA) 108 (90 Base) MCG/ACT inhaler Inhale 2 puffs into the lungs every 4 (four) hours as needed for wheezing or shortness of breath. 1 Inhaler 2 More than  a month at Unknown time  . cyclobenzaprine (FLEXERIL) 5 MG tablet Take 1 tablet (5 mg total) by mouth 3 (three) times daily as needed. 10 tablet 0 More than a month at Unknown time  . gabapentin (NEURONTIN) 100 MG capsule Start with taking one capsule in the evening for the next four days,then one capsule BID for the following four days,then one capsule three times per day. (Patient taking differently: Take 100 mg by mouth 3 (three) times daily. ) 90 capsule 2 not taking  . nitroGLYCERIN (NITROSTAT) 0.4 MG SL tablet Place 1 tablet (0.4 mg total) under the tongue every 5 (five) minutes as needed for chest pain. 10 tablet 2 04/21/2018   Scheduled:  . aspirin  81 mg Oral Daily  . atorvastatin  80 mg Oral q1800  . citalopram  20 mg Oral q morning - 10a  . doxazosin  4 mg Oral q morning - 10a  . [START ON 04/27/2018] heparin-papaverine-plasmalyte irrigation   Irrigation To OR  . levothyroxine  112 mcg Oral QAC breakfast  . loratadine  10 mg Oral Daily  . [START ON 04/27/2018] magnesium sulfate  40 mEq Other To OR  . metoprolol tartrate  50 mg Oral BID  . pantoprazole  40 mg Oral Daily  . [START ON 04/27/2018] potassium chloride  80 mEq Other To OR  . sodium chloride flush  3 mL Intravenous Q12H  . tamsulosin  0.4 mg Oral Daily  . [START ON 04/27/2018] tranexamic acid  15 mg/kg Intravenous To OR  . [START ON 04/27/2018] tranexamic acid  2 mg/kg Intracatheter To OR  . [START ON 04/27/2018] vancomycin 1000 mg in NS (1000 ml) irrigation for Dr. Roxy Manns case   Irrigation To OR    Assessment: 67 yo male with 3VCAD with plans for CABG on 6/28. Pharmacy consulted to dose heparin. He is noted on sq heparin (last dose this am) and he had some bruising. Also noted with history of ITP -plt= 118, hg=12.3  Goal of Therapy:  Heparin level 0.3-0.7 units/ml Monitor platelets by anticoagulation protocol: Yes   Plan:  -no heparin bolus -Start heparin at 1200 units/hr -Heparin level in 6 hours and daily wth CBC  daily  Hildred Laser, PharmD Clinical Pharmacist Please check Amion for pharmacy contact number

## 2018-04-26 NOTE — Progress Notes (Signed)
[place] for heparin  Indication: plans for CABG  Allergies  Allergen Reactions  . Penicillins Other (See Comments)    SYNCOPAL EPISODE  Has patient had a PCN reaction causing immediate rash, facial/tongue/throat swelling, SOB or LIGHTHEADEDNESS with HYPOTENSION [SYNCOPE] #  #  #  YES  #  #  #  Has patient had a PCN reaction causing severe rash involving mucus membranes or skin necrosis:No Has patient had a PCN reaction that required hospitalization:No Has patient had a PCN reaction occurring within the last 10 years:Yes   . Sulfa Antibiotics Swelling    SWELLING REACTION UNSPECIFIED > PER PREVIOUS PMH  . Zoloft [Sertraline Hcl] Other (See Comments)    Confusion     Patient Measurements: Height: 5\' 11"  (180.3 cm) Weight: 272 lb (123.4 kg) IBW/kg (Calculated) : 75.3 Heparin Dosing Weight: 103kg  Vital Signs: Temp: 97.6 F (36.4 C) (06/27 1628) Temp Source: Oral (06/27 1628) BP: 137/89 (06/27 1628) Pulse Rate: 63 (06/27 0754)  Labs: Recent Labs    04/24/18 1033 04/25/18 0358 04/26/18 1646  HGB 13.7 12.3*  --   HCT 42.3 38.3*  --   PLT 127* 118*  --   APTT  --  30  --   LABPROT  --  13.5  --   INR  --  1.03  --   HEPARINUNFRC  --   --  0.24*  CREATININE 1.03 0.98  --   TROPONINI  --  <0.03  --     Estimated Creatinine Clearance: 97.8 mL/min (by C-G formula based on SCr of 0.98 mg/dL).   Medical History: Past Medical History:  Diagnosis Date  . Allergy   . Carpal tunnel syndrome   . Chronic back pain   . Chronic combined systolic and diastolic congestive heart failure ([place])   . Chronic ITP (idiopathic thrombocytopenia) ([facility]) 05/25/2015  . Coronary artery disease   . Coronary artery disease involving native coronary artery of native heart with angina pectoris ([place])   . Degenerative disc disease   . GERD (gastroesophageal reflux disease)   . History of thrombocytopenia   . Hypercholesteremia   . Hypertension   .  Hypothyroid   . Lumbar pain   . Myocardial infarction ([place]) 2009  . Obesity   . Shortness of breath dyspnea     Medications:  Medications Prior to Admission  Medication Sig Dispense Refill Last Dose  . atorvastatin (LIPITOR) 80 MG tablet Take 1 tablet (80 mg total) by mouth daily. 90 tablet 0 04/23/2018 at 1400  . cetirizine (ZYRTEC) 10 MG tablet Take 10 mg by mouth at bedtime.     04/23/2018 at 1400  . citalopram (CELEXA) 20 MG tablet TAKE 1 TABLET BY MOUTH EVERY MORNING. (Patient taking differently: TAKE 1 TABLET(20mg ) BY MOUTH EVERY MORNING.) 90 tablet 1 04/23/2018 at 1400  . doxazosin (CARDURA) 4 MG tablet TAKE 1 TABLET BY MOUTH EVERY MORNING. (Patient taking differently: TAKE 1 TABLET(4mg ) BY MOUTH EVERY MORNING.) 90 tablet 1 04/23/2018 at 1400  . levothyroxine (SYNTHROID, LEVOTHROID) 112 MCG tablet TAKE 1 TABLET BY MOUTH DAILY BEFORE BREAKFAST. (Patient taking differently: TAKE 1 TABLET(179mcg) BY MOUTH DAILY BEFORE BREAKFAST.) 90 tablet 0 04/23/2018 at 1400  . metoprolol tartrate (LOPRESSOR) 50 MG tablet TAKE 1 TABLET BY MOUTH TWICE DAILY. (Patient taking differently: TAKE 1 TABLET(50mg ) BY MOUTH TWICE DAILY.) 60 tablet 5 04/23/2018 at 1400  . Multiple Vitamin (MULTIVITAMIN) capsule Take 1 capsule by mouth daily.     04/23/2018 at  1400  . pantoprazole (PROTONIX) 40 MG tablet TAKE 1 TABLET BY MOUTH ONCE A DAY. (Patient taking differently: TAKE 1 TABLET(40mg ) BY MOUTH ONCE A DAY.) 90 tablet 1 04/23/2018 at 1400  . potassium chloride SA (K-DUR,KLOR-CON) 20 MEQ tablet Take 40 mEq by mouth daily.   04/23/2018 at 1400  . tamsulosin (FLOMAX) 0.4 MG CAPS capsule TAKE 1 CAPSULE BY MOUTH DAILY. (Patient taking differently: TAKE 1 CAPSULE (0.4mg ) BY MOUTH DAILY.) 30 capsule 5 04/23/2018 at 1400  . torsemide (DEMADEX) 20 MG tablet Take 40 mg by mouth 2 (two) times daily.    04/23/2018 at 1400  . albuterol (PROVENTIL HFA;VENTOLIN HFA) 108 (90 Base) MCG/ACT inhaler Inhale 2 puffs into the lungs every 4 (four)  hours as needed for wheezing or shortness of breath. 1 Inhaler 2 More than a month at Unknown time  . cyclobenzaprine (FLEXERIL) 5 MG tablet Take 1 tablet (5 mg total) by mouth 3 (three) times daily as needed. 10 tablet 0 More than a month at Unknown time  . gabapentin (NEURONTIN) 100 MG capsule Start with taking one capsule in the evening for the next four days,then one capsule BID for the following four days,then one capsule three times per day. (Patient taking differently: Take 100 mg by mouth 3 (three) times daily. ) 90 capsule 2 not taking  . nitroGLYCERIN (NITROSTAT) 0.4 MG SL tablet Place 1 tablet (0.4 mg total) under the tongue every 5 (five) minutes as needed for chest pain. 10 tablet 2 04/21/2018   Scheduled:  . aspirin  81 mg Oral Daily  . atorvastatin  80 mg Oral q1800  . chlorhexidine  60 mL Topical Once   And  . [START ON 04/27/2018] chlorhexidine  60 mL Topical Once  . [START ON 04/27/2018] chlorhexidine  15 mL Mouth/Throat Once  . citalopram  20 mg Oral q morning - 10a  . doxazosin  4 mg Oral q morning - 10a  . [START ON 04/27/2018] heparin-papaverine-plasmalyte irrigation   Irrigation To OR  . levothyroxine  112 mcg Oral QAC breakfast  . loratadine  10 mg Oral Daily  . [START ON 04/27/2018] magnesium sulfate  40 mEq Other To OR  . [START ON 04/27/2018] metoprolol tartrate  12.5 mg Oral Once  . metoprolol tartrate  50 mg Oral BID  . pantoprazole  40 mg Oral Daily  . [START ON 04/27/2018] potassium chloride  80 mEq Other To OR  . sodium chloride flush  3 mL Intravenous Q12H  . tamsulosin  0.4 mg Oral Daily  . [START ON 04/27/2018] tranexamic acid  15 mg/kg Intravenous To OR  . [START ON 04/27/2018] tranexamic acid  2 mg/kg Intracatheter To OR  . [START ON 04/27/2018] vancomycin 1000 mg in NS (1000 ml) irrigation for Dr. Roxy Manns case   Irrigation To OR    Assessment: 67 yo male with 3VCAD with plans for CABG on 6/28. Pharmacy consulted to dose heparin. He is noted on sq heparin (last  dose this am) and he had some bruising. Also noted with history of ITP  Initial heparin level came back low at 0.24, on 1200 units/hr. Hgb 12.3, plt 118 today. Plan to stop heparin infusion tonight at midnight. No s/sx of bleeding. No infusion issues.  Goal of Therapy:  Heparin level 0.3-0.7 units/ml Monitor platelets by anticoagulation protocol: Yes   Plan:  -Increase heparin infusion to 1300 units/hr until discontinued on 6/27@2359  -Plan for CABG on 6/28 - pharmacy is signed off of consult  Doylene Canard,  PharmD Clinical Pharmacist  Pager: [phone] Phone: [phone]

## 2018-04-26 NOTE — Anesthesia Preprocedure Evaluation (Addendum)
Anesthesia Evaluation  Patient identified by MRN, date of birth, ID band Patient awake    Reviewed: Allergy & Precautions, H&P , NPO status , Patient's Chart, lab work & pertinent test results, reviewed documented beta blocker date and time   Airway Mallampati: II  TM Distance: >3 FB Neck ROM: Full    Dental  (+) Dental Advisory Given, Poor Dentition, Missing, Loose, Chipped   Pulmonary shortness of breath and with exertion, former smoker,    breath sounds clear to auscultation       Cardiovascular hypertension, Pt. on home beta blockers and Pt. on medications + angina + CAD, + Past MI, + Cardiac Stents and +CHF   Rhythm:Regular Rate:Normal     Neuro/Psych  Neuromuscular disease    GI/Hepatic GERD  Medicated and Controlled,  Endo/Other  Hypothyroidism   Renal/GU      Musculoskeletal  (+) Arthritis ,   Abdominal (+) + obese,   Peds  Hematology   Anesthesia Other Findings   Reproductive/Obstetrics                           Anesthesia Physical  Anesthesia Plan  ASA: IV  Anesthesia Plan: General   Post-op Pain Management:    Induction: Intravenous  PONV Risk Score and Plan: 3 and Ondansetron  Airway Management Planned: Simple Face Mask  Additional Equipment: Arterial line, CVP, PA Cath, TEE and Ultrasound Guidance Line Placement  Intra-op Plan:   Post-operative Plan: Post-operative intubation/ventilation  Informed Consent: I have reviewed the patients History and Physical, chart, labs and discussed the procedure including the risks, benefits and alternatives for the proposed anesthesia with the patient or authorized representative who has indicated his/her understanding and acceptance.   Dental advisory given  Plan Discussed with: CRNA  Anesthesia Plan Comments:        Anesthesia Quick Evaluation

## 2018-04-26 NOTE — Progress Notes (Signed)
Progress Note  Patient Name: [name] Date of Encounter: 04/26/2018  Primary Cardiologist: Jenkins Rouge, MD   Subjective   No chest pain Large bruise on RLQ from [facility] heparin   Inpatient Medications    Scheduled Meds: . aspirin  81 mg Oral Daily  . atorvastatin  80 mg Oral q1800  . citalopram  20 mg Oral q morning - 10a  . doxazosin  4 mg Oral q morning - 10a  . [START ON 04/27/2018] heparin-papaverine-plasmalyte irrigation   Irrigation To OR  . heparin  5,000 Units Subcutaneous Q8H  . levothyroxine  112 mcg Oral QAC breakfast  . loratadine  10 mg Oral Daily  . [START ON 04/27/2018] magnesium sulfate  40 mEq Other To OR  . metoprolol tartrate  50 mg Oral BID  . pantoprazole  40 mg Oral Daily  . [START ON 04/27/2018] potassium chloride  80 mEq Other To OR  . sodium chloride flush  3 mL Intravenous Q12H  . tamsulosin  0.4 mg Oral Daily  . [START ON 04/27/2018] tranexamic acid  15 mg/kg Intravenous To OR  . [START ON 04/27/2018] tranexamic acid  2 mg/kg Intracatheter To OR  . [START ON 04/27/2018] vancomycin 1000 mg in NS (1000 ml) irrigation for Dr. Roxy Manns case   Irrigation To OR   Continuous Infusions: . sodium chloride    . [START ON 04/27/2018] dexmedetomidine    . [START ON 04/27/2018] DOPamine    . [START ON 04/27/2018] epinephrine    . [START ON 04/27/2018] heparin 30,000 units/NS 1000 mL solution for CELLSAVER    . [START ON 04/27/2018] insulin (NOVOLIN-R) infusion    . [START ON 04/27/2018] levofloxacin (LEVAQUIN) IV    . [START ON 04/27/2018] milrinone    . [START ON 04/27/2018] nitroGLYCERIN    . [START ON 04/27/2018] phenylephrine 20mg /21mL NS (0.08mg /ml) infusion    . [START ON 04/27/2018] tranexamic acid (CYKLOKAPRON) infusion ([facility])    . [START ON 04/27/2018] vancomycin     PRN Meds: sodium chloride, acetaminophen, albuterol, morphine injection, nitroGLYCERIN, ondansetron (ZOFRAN) IV, sodium chloride flush   Vital Signs    Vitals:   04/25/18 2140 04/25/18 2315  04/26/18 0305 04/26/18 0754  BP: 133/83 (!) 99/58 131/80 (!) 141/69  Pulse:    63  Resp: 19 16 18  (!) 21  Temp:  98.1 F (36.7 C) 97.8 F (36.6 C) 98.6 F (37 C)  TempSrc:  Oral Oral Oral  SpO2:  99%  98%  Weight:      Height:        Intake/Output Summary (Last 24 hours) at 04/26/2018 0854 Last data filed at 04/25/2018 1800 Gross per 24 hour  Intake 1203 ml  Output -  Net 1203 ml   Filed Weights   04/24/18 0549  Weight: 272 lb (123.4 kg)    Telemetry    NSR 04/26/2018  - Personally Reviewed  ECG    NSR no acute ST changes  - Personally Reviewed  Physical Exam  Poor dentition  GEN: No acute distress.   Neck: No JVD Cardiac: RRR, no murmurs, rubs, or gallops.  Respiratory: Clear to auscultation bilaterally. GI: Soft, nontender, non-distended  MS: No edema; No deformity. Neuro:  Nonfocal  Psych: Normal affect   Labs    Chemistry Recent Labs  Lab 04/19/18 1122 04/24/18 1033 04/25/18 0358  NA 140  --  141  K 3.5  --  3.5  CL 102  --  103  CO2 28  --  28  GLUCOSE 127*  --  90  BUN 20  --  14  CREATININE 1.02 1.03 0.98  CALCIUM 8.9  --  8.5*  PROT  --   --  5.7*  ALBUMIN  --   --  3.4*  AST  --   --  13*  ALT  --   --  12  ALKPHOS  --   --  75  BILITOT  --   --  1.1  GFRNONAA >60 >60 >60  GFRAA >60 >60 >60  ANIONGAP 10  --  10     Hematology Recent Labs  Lab 04/19/18 1122 04/24/18 1033 04/25/18 0358  WBC 8.4 7.0 6.1  RBC 4.87 4.61 4.11*  HGB 14.7 13.7 12.3*  HCT 44.6 42.3 38.3*  MCV 91.6 91.8 93.2  MCH 30.2 29.7 29.9  MCHC 33.0 32.4 32.1  RDW 13.9 14.0 13.9  PLT 161 127* 118*    Cardiac Enzymes Recent Labs  Lab 04/25/18 0358  TROPONINI <0.03   No results for input(s): TROPIPOC in the last 168 hours.   BNP Recent Labs  Lab 04/25/18 0358  BNP 152.8*     DDimer No results for input(s): DDIMER in the last 168 hours.   Radiology    No results found.  Cardiac Studies   Cath with occluded OM stent  Occluded distal RCA with  some collaterals and 75% Mid LAD  Patient Profile     67 y.o. male with unstable angina and severe 3VD. Poor distal targets in circumflex And RCA territories EF 45-50%  Assessment & Plan    Unstable Angina: reviewed films Feel he would benefit even with just LIMA if no vein grafts Can be placed to distal RCA or circumflex. He has ITP and long term DAT with long area Of stenting in mid LAD would be sub-optimal Has had high PRU's on plavix and would need Brilinta or Effient. Has shown [place] he occludes long stented areas in OM  CABG Friday with Dr Roxy Manns May only be able to place LIMA to LAD. Will d/c [place] heparin and place on iv to minimize bruising   ITP:  PLT stable no bleeding issues at cath sight PLT count 118  For questions or updates, please contact [facility] Please consult [email or web address] for contact info under Cardiology/STEMI.      Signed, Jenkins Rouge, MD  04/26/2018, 8:54 AM

## 2018-04-26 NOTE — Progress Notes (Signed)
CARDIAC REHAB PHASE I   PRE:  Rate/Rhythm: 73 SR  BP:  Supine:   Sitting: 119/81  Standing:    SaO2: 96%RA  MODE:  Ambulation: 300 ft   POST:  Rate/Rhythm: 92 SR  BP:  Supine:   Sitting: 135/80  Standing:    SaO2: 97%RA 1055-1125 Pt walked 300 ft on RA with rolling walker. Limited by back issues. Has rollator at home. No CP.  Reviewed staying in the tube and importance of IS and walking after surgery. Pt can get to 2500 ml on IS. Will follow up after surgery. Pt does not want to watch pre op video. Has been looking at [facility] booklet.   Graylon Good, RN BSN  04/26/2018 11:24 AM

## 2018-04-26 NOTE — Progress Notes (Signed)
Pre-op Cardiac Surgery  Carotid Findings:  Bilateral 1% to 39% ICA stenosis. Vertebral artery flow is antegrade.  Upper Extremity Right Left  Brachial Pressures 141 Triphasic 117 Triphasic  Radial Waveforms Triphasic Triphasic  Ulnar Waveforms Triphasic Triphasic  Palmar Arch (Allen's Test) Normal Normal   Findings:  Palmar arch evaluation - Doppler waveforms remained normal bilaterally with both radial and ulnar compressions.    Lower  Extremity Right Left  Dorsalis Pedis 154 Triphasic 140 Biphasic      Posterior Tibial 134 Biphasic 140 Biphasic  Ankle/Brachial Indices 1.09 1.05    Findings:  ABIs are within normal limits bilaterally at rest.  Toma Copier, RVS 04/26/2018 10:22 AM

## 2018-04-26 NOTE — Progress Notes (Signed)
      [address]       [place] [address]             [phone]     CARDIOTHORACIC SURGERY PROGRESS NOTE  2 Days Post-Op  S/P Procedure(s) (LRB): LEFT HEART CATH AND CORONARY ANGIOGRAPHY (N/A)  Subjective: No complaints.  No chest pain, SOB  Objective: Vital signs in last 24 hours: Temp:  [97.8 F (36.6 C)-98.6 F (37 C)] 98.6 F (37 C) (06/27 0754) Pulse Rate:  [63] 63 (06/27 0754) Cardiac Rhythm: Normal sinus rhythm (06/27 0800) Resp:  [16-21] 21 (06/27 0754) BP: (99-141)/(50-83) 141/69 (06/27 0754) SpO2:  [98 %-99 %] 98 % (06/27 0754)  Physical Exam:  Rhythm:   sinus  Breath sounds: clear  Heart sounds:  RRR  Incisions:  n/a  Abdomen:  soft  Extremities:  warm   Intake/Output from previous day: 06/26 0701 - 06/27 0700 In: 9485 [P.O.:1200; I.V.:3] Out: -  Intake/Output this shift: Total I/O In: 720 [P.O.:720] Out: -   Lab Results: Recent Labs    04/24/18 1033 04/25/18 0358  WBC 7.0 6.1  HGB 13.7 12.3*  HCT 42.3 38.3*  PLT 127* 118*   BMET:  Recent Labs    04/24/18 1033 04/25/18 0358  NA  --  141  K  --  3.5  CL  --  103  CO2  --  28  GLUCOSE  --  90  BUN  --  14  CREATININE 1.03 0.98  CALCIUM  --  8.5*    CBG (last 3)  No results for input(s): GLUCAP in the last 72 hours. PT/INR:   Recent Labs    04/25/18 0358  LABPROT 13.5  INR 1.03    CXR:  CHEST - 2 VIEW  COMPARISON:  Chest x-ray 05/25/2015.  FINDINGS: Mediastinum hilar structures normal. Lungs are clear. No pleural effusion pneumothorax. Cardiac stents noted. Stable cardiomegaly with normal pulmonary vascularity. Degenerative change thoracic spine. Prior cervicothoracic spine fusion.  IMPRESSION: 1. Cardiac stents noted. Cardiomegaly with normal pulmonary vascularity. Heart size is stable from 05/25/2015.  2.  No acute pulmonary disease.   Electronically Signed   By: Marcello Moores  Register   On: 04/17/2018 12:49  Assessment/Plan: S/P  Procedure(s) (LRB): LEFT HEART CATH AND CORONARY ANGIOGRAPHY (N/A)  I have again reviewed the indications, risk, and potential benefits of surgery with the patient and his wife.  Expectations for his postoperative recovery have been discussed.  All of his questions have been answered.  We plan coronary artery bypass grafting in the OR tomorrow morning.  Stop heparin at midnight tonight.   I spent in excess of 15 minutes during the conduct of this hospital encounter and >50% of this time involved direct face-to-face encounter with the patient for counseling and/or coordination of their care.    [name], MD 04/26/2018 2:26 PM

## 2018-04-27 ENCOUNTER — Inpatient Hospital Stay (HOSPITAL_COMMUNITY): Payer: Medicare Other

## 2018-04-27 ENCOUNTER — Inpatient Hospital Stay (HOSPITAL_COMMUNITY)
Admission: AD | Disposition: A | Discharge status: Home / Self Care | Payer: Self-pay | Source: Home / Self Care | Attending: Thoracic Surgery (Cardiothoracic Vascular Surgery)

## 2018-04-27 ENCOUNTER — Encounter (HOSPITAL_COMMUNITY): Payer: Self-pay | Admitting: Certified Registered Nurse Anesthetist

## 2018-04-27 ENCOUNTER — Inpatient Hospital Stay (HOSPITAL_COMMUNITY): Payer: Medicare Other | Admitting: Certified Registered Nurse Anesthetist

## 2018-04-27 DIAGNOSIS — Z951 Presence of aortocoronary bypass graft: Secondary | ICD-10-CM

## 2018-04-27 HISTORY — DX: Presence of aortocoronary bypass graft: Z95.1

## 2018-04-27 HISTORY — PX: TEE WITHOUT CARDIOVERSION: SHX5443

## 2018-04-27 HISTORY — PX: CORONARY ARTERY BYPASS GRAFT: SHX141

## 2018-04-27 LAB — POCT I-STAT, CHEM 8
BUN: 5 mg/dL — ABNORMAL LOW (ref 8–23)
BUN: 4 mg/dL — AB (ref 8–23)
BUN: 5 mg/dL — ABNORMAL LOW (ref 8–23)
BUN: 5 mg/dL — AB (ref 8–23)
BUN: 4 mg/dL — AB (ref 8–23)
BUN: 5 mg/dL — AB (ref 8–23)
CALCIUM ION: 1.07 mmol/L — AB (ref 1.15–1.40)
CALCIUM ION: 1.12 mmol/L — AB (ref 1.15–1.40)
CALCIUM ION: 1.11 mmol/L — AB (ref 1.15–1.40)
CHLORIDE: 101 mmol/L (ref 98–111)
CHLORIDE: 102 mmol/L (ref 98–111)
CHLORIDE: 98 mmol/L (ref 98–111)
CREATININE: 0.6 mg/dL — AB (ref 0.61–1.24)
CREATININE: 0.6 mg/dL — AB (ref 0.61–1.24)
CREATININE: 0.6 mg/dL — AB (ref 0.61–1.24)
CREATININE: 0.6 mg/dL — AB (ref 0.61–1.24)
Calcium, Ion: 1.22 mmol/L (ref 1.15–1.40)
Calcium, Ion: 1.24 mmol/L (ref 1.15–1.40)
Calcium, Ion: 1.11 mmol/L — ABNORMAL LOW (ref 1.15–1.40)
Chloride: 103 mmol/L (ref 98–111)
Chloride: 102 mmol/L (ref 98–111)
Chloride: 106 mmol/L (ref 98–111)
Creatinine, Ser: 0.5 mg/dL — ABNORMAL LOW (ref 0.61–1.24)
Creatinine, Ser: 0.6 mg/dL — ABNORMAL LOW (ref 0.61–1.24)
GLUCOSE: 97 mg/dL (ref 70–99)
GLUCOSE: 122 mg/dL — AB (ref 70–99)
GLUCOSE: 118 mg/dL — AB (ref 70–99)
Glucose, Bld: 103 mg/dL — ABNORMAL HIGH (ref 70–99)
Glucose, Bld: 88 mg/dL (ref 70–99)
Glucose, Bld: 125 mg/dL — ABNORMAL HIGH (ref 70–99)
HCT: 33 % — ABNORMAL LOW (ref 39.0–52.0)
HCT: 30 % — ABNORMAL LOW (ref 39.0–52.0)
HCT: 27 % — ABNORMAL LOW (ref 39.0–52.0)
HCT: 27 % — ABNORMAL LOW (ref 39.0–52.0)
HCT: 25 % — ABNORMAL LOW (ref 39.0–52.0)
HEMATOCRIT: 26 % — AB (ref 39.0–52.0)
HEMOGLOBIN: 11.2 g/dL — AB (ref 13.0–17.0)
Hemoglobin: 10.2 g/dL — ABNORMAL LOW (ref 13.0–17.0)
Hemoglobin: 8.8 g/dL — ABNORMAL LOW (ref 13.0–17.0)
Hemoglobin: 9.2 g/dL — ABNORMAL LOW (ref 13.0–17.0)
Hemoglobin: 9.2 g/dL — ABNORMAL LOW (ref 13.0–17.0)
Hemoglobin: 8.5 g/dL — ABNORMAL LOW (ref 13.0–17.0)
POTASSIUM: 3.5 mmol/L (ref 3.5–5.1)
POTASSIUM: 3.6 mmol/L (ref 3.5–5.1)
POTASSIUM: 4.1 mmol/L (ref 3.5–5.1)
Potassium: 3.1 mmol/L — ABNORMAL LOW (ref 3.5–5.1)
Potassium: 4.6 mmol/L (ref 3.5–5.1)
Potassium: 4.5 mmol/L (ref 3.5–5.1)
SODIUM: 141 mmol/L (ref 135–145)
Sodium: 141 mmol/L (ref 135–145)
Sodium: 140 mmol/L (ref 135–145)
Sodium: 139 mmol/L (ref 135–145)
Sodium: 140 mmol/L (ref 135–145)
Sodium: 141 mmol/L (ref 135–145)
TCO2: 28 mmol/L (ref 22–32)
TCO2: 26 mmol/L (ref 22–32)
TCO2: 27 mmol/L (ref 22–32)
TCO2: 25 mmol/L (ref 22–32)
TCO2: 26 mmol/L (ref 22–32)
TCO2: 24 mmol/L (ref 22–32)

## 2018-04-27 LAB — BLOOD GAS, ARTERIAL
Acid-Base Excess: 3.1 mmol/L — ABNORMAL HIGH (ref 0.0–2.0)
Bicarbonate: 27.3 mmol/L (ref 20.0–28.0)
DRAWN BY: 51831
FIO2: 21
O2 SAT: 95.4 %
PCO2 ART: 43.3 mmHg (ref 32.0–48.0)
Patient temperature: 98.6
pH, Arterial: 7.416 (ref 7.350–7.450)
pO2, Arterial: 77.5 mmHg — ABNORMAL LOW (ref 83.0–108.0)

## 2018-04-27 LAB — URINALYSIS, ROUTINE W REFLEX MICROSCOPIC
BILIRUBIN URINE: NEGATIVE
Glucose, UA: NEGATIVE mg/dL
Hgb urine dipstick: NEGATIVE
KETONES UR: NEGATIVE mg/dL
LEUKOCYTES UA: NEGATIVE
NITRITE: NEGATIVE
PH: 6 (ref 5.0–8.0)
Protein, ur: NEGATIVE mg/dL
SPECIFIC GRAVITY, URINE: 1.005 (ref 1.005–1.030)

## 2018-04-27 LAB — POCT I-STAT 3, ART BLOOD GAS (G3+)
ACID-BASE DEFICIT: 1 mmol/L (ref 0.0–2.0)
ACID-BASE EXCESS: 2 mmol/L (ref 0.0–2.0)
Acid-base deficit: 1 mmol/L (ref 0.0–2.0)
Acid-base deficit: 3 mmol/L — ABNORMAL HIGH (ref 0.0–2.0)
BICARBONATE: 26.7 mmol/L (ref 20.0–28.0)
BICARBONATE: 25.5 mmol/L (ref 20.0–28.0)
Bicarbonate: 24.3 mmol/L (ref 20.0–28.0)
Bicarbonate: 24.2 mmol/L (ref 20.0–28.0)
O2 SAT: 100 %
O2 SAT: 100 %
O2 SAT: 98 %
O2 Saturation: 99 %
PCO2 ART: 44.1 mmHg (ref 32.0–48.0)
PH ART: 7.278 — AB (ref 7.350–7.450)
PO2 ART: 197 mmHg — AB (ref 83.0–108.0)
PO2 ART: 129 mmHg — AB (ref 83.0–108.0)
PO2 ART: 123 mmHg — AB (ref 83.0–108.0)
Patient temperature: 36.6
TCO2: 28 mmol/L (ref 22–32)
TCO2: 26 mmol/L (ref 22–32)
TCO2: 27 mmol/L (ref 22–32)
TCO2: 26 mmol/L (ref 22–32)
pCO2 arterial: 42.7 mmHg (ref 32.0–48.0)
pCO2 arterial: 49.5 mmHg — ABNORMAL HIGH (ref 32.0–48.0)
pCO2 arterial: 51.4 mmHg — ABNORMAL HIGH (ref 32.0–48.0)
pH, Arterial: 7.404 (ref 7.350–7.450)
pH, Arterial: 7.35 (ref 7.350–7.450)
pH, Arterial: 7.317 — ABNORMAL LOW (ref 7.350–7.450)
pO2, Arterial: 294 mmHg — ABNORMAL HIGH (ref 83.0–108.0)

## 2018-04-27 LAB — GLUCOSE, CAPILLARY
GLUCOSE-CAPILLARY: 107 mg/dL — AB (ref 70–99)
GLUCOSE-CAPILLARY: 107 mg/dL — AB (ref 70–99)
GLUCOSE-CAPILLARY: 119 mg/dL — AB (ref 70–99)
GLUCOSE-CAPILLARY: 126 mg/dL — AB (ref 70–99)
GLUCOSE-CAPILLARY: 126 mg/dL — AB (ref 70–99)
Glucose-Capillary: 94 mg/dL (ref 70–99)
Glucose-Capillary: 128 mg/dL — ABNORMAL HIGH (ref 70–99)
Glucose-Capillary: 110 mg/dL — ABNORMAL HIGH (ref 70–99)

## 2018-04-27 LAB — HEMOGLOBIN AND HEMATOCRIT, BLOOD
HEMATOCRIT: 28.6 % — AB (ref 39.0–52.0)
HEMOGLOBIN: 9.5 g/dL — AB (ref 13.0–17.0)

## 2018-04-27 LAB — CBC
HEMATOCRIT: 32.9 % — AB (ref 39.0–52.0)
HEMATOCRIT: 29.4 % — AB (ref 39.0–52.0)
HEMOGLOBIN: 10.7 g/dL — AB (ref 13.0–17.0)
HEMOGLOBIN: 9.2 g/dL — AB (ref 13.0–17.0)
MCH: 30.5 pg (ref 26.0–34.0)
MCH: 29.9 pg (ref 26.0–34.0)
MCHC: 32.5 g/dL (ref 30.0–36.0)
MCHC: 31.3 g/dL (ref 30.0–36.0)
MCV: 93.7 fL (ref 78.0–100.0)
MCV: 95.5 fL (ref 78.0–100.0)
Platelets: 94 10*3/uL — ABNORMAL LOW (ref 150–400)
Platelets: 102 10*3/uL — ABNORMAL LOW (ref 150–400)
RBC: 3.51 MIL/uL — ABNORMAL LOW (ref 4.22–5.81)
RBC: 3.08 MIL/uL — ABNORMAL LOW (ref 4.22–5.81)
RDW: 13.9 % (ref 11.5–15.5)
RDW: 14 % (ref 11.5–15.5)
WBC: 9.9 10*3/uL (ref 4.0–10.5)
WBC: 10.3 10*3/uL (ref 4.0–10.5)

## 2018-04-27 LAB — PLATELET COUNT: PLATELETS: 96 10*3/uL — AB (ref 150–400)

## 2018-04-27 LAB — MAGNESIUM: MAGNESIUM: 2.8 mg/dL — AB (ref 1.7–2.4)

## 2018-04-27 LAB — CREATININE, SERUM
Creatinine, Ser: 0.78 mg/dL (ref 0.61–1.24)
GFR calc non Af Amer: >60 mL/min (ref >60)

## 2018-04-27 LAB — PROTIME-INR
INR: 1.27
Prothrombin Time: 15.7 seconds — ABNORMAL HIGH (ref 11.4–15.2)

## 2018-04-27 LAB — ECHO INTRAOPERATIVE TEE
Height: 71 in
WEIGHTICAEL: 4494.4 [oz_av]

## 2018-04-27 LAB — APTT: aPTT: 31 seconds (ref 24–36)

## 2018-04-27 SURGERY — CORONARY ARTERY BYPASS GRAFTING (CABG)
Anesthesia: General | Site: Chest

## 2018-04-27 MED ORDER — LACTATED RINGERS IV SOLN
INTRAVENOUS | Status: DC | PRN
  Administered 2018-04-27 (×2): via INTRAVENOUS

## 2018-04-27 MED ORDER — BISACODYL 10 MG RE SUPP: 10.0000 mg | Freq: Every day | RECTAL | Status: DC

## 2018-04-27 MED ORDER — ACETAMINOPHEN 160 MG/5ML PO SOLN: 1000.0000 mg | Freq: Four times a day (QID) | ORAL | Status: DC

## 2018-04-27 MED ORDER — MIDAZOLAM HCL 10 MG/2ML IJ SOLN
INTRAMUSCULAR | Status: AC
  Filled 2018-04-27: qty 2

## 2018-04-27 MED ORDER — FAMOTIDINE IN NACL 20-0.9 MG/50ML-% IV SOLN
20.0000 mg | Freq: Two times a day (BID) | INTRAVENOUS | Status: AC
  Administered 2018-04-27 (×2): 20 mg via INTRAVENOUS
  Filled 2018-04-27: qty 50

## 2018-04-27 MED ORDER — HEPARIN SODIUM (PORCINE) 1000 UNIT/ML IJ SOLN
INTRAMUSCULAR | Status: AC
  Filled 2018-04-27: qty 1

## 2018-04-27 MED ORDER — MORPHINE SULFATE (PF) 2 MG/ML IV SOLN
1.0000 mg | INTRAVENOUS | Status: DC | PRN
  Administered 2018-04-27 – 2018-04-29 (×11): 2 mg via INTRAVENOUS
  Filled 2018-04-27 (×10): qty 1

## 2018-04-27 MED ORDER — ORAL CARE MOUTH RINSE: 15.0000 mL | OROMUCOSAL | Status: DC

## 2018-04-27 MED ORDER — METOPROLOL TARTRATE 5 MG/5ML IV SOLN
2.5000 mg | INTRAVENOUS | Status: DC | PRN
  Administered 2018-04-29: 5 mg via INTRAVENOUS
  Filled 2018-04-27: qty 5

## 2018-04-27 MED ORDER — POTASSIUM CHLORIDE 10 MEQ/50ML IV SOLN
10.0000 meq | INTRAVENOUS | Status: AC
  Administered 2018-04-27 (×3): 10 meq via INTRAVENOUS

## 2018-04-27 MED ORDER — MIDAZOLAM HCL 2 MG/2ML IJ SOLN: 2.0000 mg | INTRAMUSCULAR | Status: DC | PRN

## 2018-04-27 MED ORDER — NITROGLYCERIN IN D5W 200-5 MCG/ML-% IV SOLN: 0.0000 ug/min | INTRAVENOUS | Status: DC

## 2018-04-27 MED ORDER — CHLORHEXIDINE GLUCONATE 0.12 % MT SOLN
15.0000 mL | OROMUCOSAL | Status: AC
  Administered 2018-04-27: 15 mL via OROMUCOSAL

## 2018-04-27 MED ORDER — SUCCINYLCHOLINE CHLORIDE 200 MG/10ML IV SOSY
PREFILLED_SYRINGE | INTRAVENOUS | Status: AC
  Filled 2018-04-27: qty 10

## 2018-04-27 MED ORDER — 0.9 % SODIUM CHLORIDE (POUR BTL) OPTIME
TOPICAL | Status: DC | PRN
  Administered 2018-04-27: 5000 mL

## 2018-04-27 MED ORDER — ARTIFICIAL TEARS OPHTHALMIC OINT
TOPICAL_OINTMENT | OPHTHALMIC | Status: DC | PRN
  Administered 2018-04-27: 1 via OPHTHALMIC

## 2018-04-27 MED ORDER — ONDANSETRON HCL 4 MG/2ML IJ SOLN
INTRAMUSCULAR | Status: DC | PRN
  Administered 2018-04-27: 4 mg via INTRAVENOUS

## 2018-04-27 MED ORDER — ASPIRIN 81 MG PO CHEW: 324.0000 mg | CHEWABLE_TABLET | Freq: Every day | ORAL | Status: DC

## 2018-04-27 MED ORDER — MIDAZOLAM HCL 2 MG/2ML IJ SOLN
INTRAMUSCULAR | Status: AC
  Filled 2018-04-27: qty 2

## 2018-04-27 MED ORDER — PHENYLEPHRINE 40 MCG/ML (10ML) SYRINGE FOR IV PUSH (FOR BLOOD PRESSURE SUPPORT)
PREFILLED_SYRINGE | INTRAVENOUS | Status: AC
  Filled 2018-04-27: qty 10

## 2018-04-27 MED ORDER — SODIUM CHLORIDE 0.9 % IV SOLN
INTRAVENOUS | Status: DC | PRN
  Administered 2018-04-27: 13:00:00 via INTRAVENOUS

## 2018-04-27 MED ORDER — SODIUM CHLORIDE 0.9 % IV SOLN
250.0000 mL | INTRAVENOUS | Status: DC
  Administered 2018-04-29: 250 mL via INTRAVENOUS

## 2018-04-27 MED ORDER — ASPIRIN EC 325 MG PO TBEC
325.0000 mg | DELAYED_RELEASE_TABLET | Freq: Every day | ORAL | Status: DC
  Administered 2018-04-28 – 2018-05-01 (×4): 325 mg via ORAL
  Filled 2018-04-27 (×4): qty 1

## 2018-04-27 MED ORDER — ACETAMINOPHEN 650 MG RE SUPP
650.0000 mg | Freq: Once | RECTAL | Status: AC
  Administered 2018-04-27: 650 mg via RECTAL

## 2018-04-27 MED ORDER — BISACODYL 5 MG PO TBEC
10.0000 mg | DELAYED_RELEASE_TABLET | Freq: Every day | ORAL | Status: DC
Start: 2018-04-28 — End: 2018-05-03
  Administered 2018-04-28 – 2018-04-30 (×3): 10 mg via ORAL
  Filled 2018-04-27 (×5): qty 2

## 2018-04-27 MED ORDER — INSULIN ASPART 100 UNIT/ML ~~LOC~~ SOLN: 0.0000 [IU] | SUBCUTANEOUS | Status: DC

## 2018-04-27 MED ORDER — PROTAMINE SULFATE 10 MG/ML IV SOLN
INTRAVENOUS | Status: AC
  Filled 2018-04-27: qty 5

## 2018-04-27 MED ORDER — ONDANSETRON HCL 4 MG/2ML IJ SOLN
INTRAMUSCULAR | Status: AC
  Filled 2018-04-27: qty 2

## 2018-04-27 MED ORDER — LACTATED RINGERS IV SOLN
INTRAVENOUS | Status: DC
  Administered 2018-04-27 (×4): 10 mL/h via INTRAVENOUS

## 2018-04-27 MED ORDER — PROPOFOL 10 MG/ML IV BOLUS
INTRAVENOUS | Status: AC
  Filled 2018-04-27: qty 20

## 2018-04-27 MED ORDER — SODIUM CHLORIDE 0.9% FLUSH: 3.0000 mL | INTRAVENOUS | Status: DC | PRN

## 2018-04-27 MED ORDER — ACETAMINOPHEN 160 MG/5ML PO SOLN: 650.0000 mg | Freq: Once | ORAL | Status: AC

## 2018-04-27 MED ORDER — PANTOPRAZOLE SODIUM 40 MG PO TBEC: 40.0000 mg | DELAYED_RELEASE_TABLET | Freq: Every day | ORAL | Status: DC

## 2018-04-27 MED ORDER — LACTATED RINGERS IV SOLN: INTRAVENOUS | Status: DC

## 2018-04-27 MED ORDER — ORAL CARE MOUTH RINSE
15.0000 mL | Freq: Two times a day (BID) | OROMUCOSAL | Status: DC
  Administered 2018-04-27 – 2018-04-29 (×3): 15 mL via OROMUCOSAL

## 2018-04-27 MED ORDER — LACTATED RINGERS IV SOLN
INTRAVENOUS | Status: DC | PRN
  Administered 2018-04-27: 06:00:00 via INTRAVENOUS

## 2018-04-27 MED ORDER — TRANEXAMIC ACID 1000 MG/10ML IV SOLN
1.5000 mg/kg/h | INTRAVENOUS | Status: DC
  Filled 2018-04-27: qty 25

## 2018-04-27 MED ORDER — ROCURONIUM BROMIDE 100 MG/10ML IV SOLN
INTRAVENOUS | Status: DC | PRN
  Administered 2018-04-27: 100 mg via INTRAVENOUS
  Administered 2018-04-27 (×3): 50 mg via INTRAVENOUS

## 2018-04-27 MED ORDER — PHENYLEPHRINE 40 MCG/ML (10ML) SYRINGE FOR IV PUSH (FOR BLOOD PRESSURE SUPPORT)
PREFILLED_SYRINGE | INTRAVENOUS | Status: DC | PRN
  Administered 2018-04-27: 40 ug via INTRAVENOUS
  Administered 2018-04-27 (×2): 80 ug via INTRAVENOUS

## 2018-04-27 MED ORDER — MORPHINE SULFATE (PF) 2 MG/ML IV SOLN
1.0000 mg | INTRAVENOUS | Status: DC | PRN
  Administered 2018-04-27 (×2): 4 mg via INTRAVENOUS
  Administered 2018-04-27 (×2): 2 mg via INTRAVENOUS
  Filled 2018-04-27: qty 2
  Filled 2018-04-27 (×3): qty 1
  Filled 2018-04-27: qty 2

## 2018-04-27 MED ORDER — ARTIFICIAL TEARS OPHTHALMIC OINT
TOPICAL_OINTMENT | OPHTHALMIC | Status: AC
  Filled 2018-04-27: qty 3.5

## 2018-04-27 MED ORDER — TRAMADOL HCL 50 MG PO TABS
50.0000 mg | ORAL_TABLET | ORAL | Status: DC | PRN
  Administered 2018-04-29 – 2018-05-02 (×5): 100 mg via ORAL
  Filled 2018-04-27 (×5): qty 2

## 2018-04-27 MED ORDER — CHLORHEXIDINE GLUCONATE 0.12% ORAL RINSE (MEDLINE KIT): 15.0000 mL | Freq: Two times a day (BID) | OROMUCOSAL | Status: DC

## 2018-04-27 MED ORDER — SODIUM CHLORIDE 0.9 % IV SOLN
INTRAVENOUS | Status: DC
  Administered 2018-04-27: 20 mL/h via INTRAVENOUS

## 2018-04-27 MED ORDER — METOPROLOL TARTRATE 25 MG/10 ML ORAL SUSPENSION: 12.5000 mg | Freq: Two times a day (BID) | ORAL | Status: DC

## 2018-04-27 MED ORDER — MIDAZOLAM HCL 5 MG/5ML IJ SOLN
INTRAMUSCULAR | Status: DC | PRN
  Administered 2018-04-27: 2 mg via INTRAVENOUS
  Administered 2018-04-27 (×2): 1 mg via INTRAVENOUS
  Administered 2018-04-27: 2 mg via INTRAVENOUS
  Administered 2018-04-27: 3 mg via INTRAVENOUS
  Administered 2018-04-27: 1 mg via INTRAVENOUS
  Administered 2018-04-27: 2 mg via INTRAVENOUS

## 2018-04-27 MED ORDER — PROPOFOL 10 MG/ML IV BOLUS
INTRAVENOUS | Status: DC | PRN
  Administered 2018-04-27: 50 mg via INTRAVENOUS
  Administered 2018-04-27: 30 mg via INTRAVENOUS

## 2018-04-27 MED ORDER — DEXMEDETOMIDINE HCL IN NACL 400 MCG/100ML IV SOLN
0.0000 ug/kg/h | INTRAVENOUS | Status: DC
  Administered 2018-04-28: 0.2 ug/kg/h via INTRAVENOUS
  Filled 2018-04-27: qty 100

## 2018-04-27 MED ORDER — ROCURONIUM BROMIDE 50 MG/5ML IV SOLN
INTRAVENOUS | Status: AC
  Filled 2018-04-27: qty 1

## 2018-04-27 MED ORDER — ALBUMIN HUMAN 5 % IV SOLN
250.0000 mL | INTRAVENOUS | Status: AC | PRN
  Administered 2018-04-27 (×3): 250 mL via INTRAVENOUS
  Filled 2018-04-27 (×2): qty 250

## 2018-04-27 MED ORDER — ACETAMINOPHEN 500 MG PO TABS
1000.0000 mg | ORAL_TABLET | Freq: Four times a day (QID) | ORAL | Status: AC
  Administered 2018-04-27 – 2018-05-02 (×20): 1000 mg via ORAL
  Filled 2018-04-27 (×20): qty 2

## 2018-04-27 MED ORDER — PHENYLEPHRINE HCL-NACL 20-0.9 MG/250ML-% IV SOLN
0.0000 ug/min | INTRAVENOUS | Status: DC
  Administered 2018-04-27: 20 ug/min via INTRAVENOUS
  Administered 2018-04-27: 45 ug/min via INTRAVENOUS
  Filled 2018-04-27 (×3): qty 250

## 2018-04-27 MED ORDER — FENTANYL CITRATE (PF) 250 MCG/5ML IJ SOLN
INTRAMUSCULAR | Status: AC
  Filled 2018-04-27: qty 5

## 2018-04-27 MED ORDER — OXYCODONE HCL 5 MG PO TABS
5.0000 mg | ORAL_TABLET | ORAL | Status: DC | PRN
  Administered 2018-04-27 – 2018-05-03 (×33): 10 mg via ORAL
  Filled 2018-04-27 (×33): qty 2

## 2018-04-27 MED ORDER — PROTAMINE SULFATE 10 MG/ML IV SOLN
INTRAVENOUS | Status: AC
  Filled 2018-04-27: qty 25

## 2018-04-27 MED ORDER — VANCOMYCIN HCL IN DEXTROSE 1-5 GM/200ML-% IV SOLN
1000.0000 mg | Freq: Once | INTRAVENOUS | Status: AC
  Administered 2018-04-27: 1000 mg via INTRAVENOUS
  Filled 2018-04-27: qty 200

## 2018-04-27 MED ORDER — ROCURONIUM BROMIDE 50 MG/5ML IV SOLN
INTRAVENOUS | Status: AC
  Filled 2018-04-27: qty 4

## 2018-04-27 MED ORDER — SODIUM CHLORIDE 0.9% FLUSH
3.0000 mL | Freq: Two times a day (BID) | INTRAVENOUS | Status: DC
  Administered 2018-04-29 – 2018-05-02 (×7): 3 mL via INTRAVENOUS

## 2018-04-27 MED ORDER — ALBUMIN HUMAN 5 % IV SOLN
INTRAVENOUS | Status: DC | PRN
  Administered 2018-04-27: 12:00:00 via INTRAVENOUS

## 2018-04-27 MED ORDER — SODIUM CHLORIDE 0.45 % IV SOLN
INTRAVENOUS | Status: DC | PRN
  Administered 2018-04-27 (×2): 20 mL/h via INTRAVENOUS

## 2018-04-27 MED ORDER — MAGNESIUM SULFATE 4 GM/100ML IV SOLN
4.0000 g | Freq: Once | INTRAVENOUS | Status: AC
  Administered 2018-04-27: 4 g via INTRAVENOUS
  Filled 2018-04-27: qty 100

## 2018-04-27 MED ORDER — ROCURONIUM BROMIDE 10 MG/ML (PF) SYRINGE: PREFILLED_SYRINGE | INTRAVENOUS | Status: DC | PRN

## 2018-04-27 MED ORDER — PHENYLEPHRINE HCL 10 MG/ML IJ SOLN
INTRAVENOUS | Status: DC | PRN
  Administered 2018-04-27: 50 ug/min via INTRAVENOUS

## 2018-04-27 MED ORDER — LEVOFLOXACIN IN D5W 750 MG/150ML IV SOLN
750.0000 mg | INTRAVENOUS | Status: AC
  Administered 2018-04-28: 750 mg via INTRAVENOUS
  Filled 2018-04-27: qty 150

## 2018-04-27 MED ORDER — INSULIN REGULAR BOLUS VIA INFUSION
0.0000 [IU] | Freq: Three times a day (TID) | INTRAVENOUS | Status: DC
  Filled 2018-04-27: qty 10

## 2018-04-27 MED ORDER — SODIUM CHLORIDE 0.9 % IV SOLN
INTRAVENOUS | Status: DC
  Administered 2018-04-27: 100 mL/h via INTRAVENOUS

## 2018-04-27 MED ORDER — DEXMEDETOMIDINE HCL IN NACL 200 MCG/50ML IV SOLN
INTRAVENOUS | Status: AC
  Filled 2018-04-27: qty 50

## 2018-04-27 MED ORDER — HEPARIN SODIUM (PORCINE) 1000 UNIT/ML IJ SOLN
INTRAMUSCULAR | Status: DC | PRN
  Administered 2018-04-27: 3000 [IU] via INTRAVENOUS
  Administered 2018-04-27: 35000 [IU] via INTRAVENOUS

## 2018-04-27 MED ORDER — FENTANYL CITRATE (PF) 250 MCG/5ML IJ SOLN
INTRAMUSCULAR | Status: DC | PRN
  Administered 2018-04-27 (×3): 150 ug via INTRAVENOUS
  Administered 2018-04-27: 50 ug via INTRAVENOUS
  Administered 2018-04-27: 150 ug via INTRAVENOUS
  Administered 2018-04-27 (×3): 100 ug via INTRAVENOUS
  Administered 2018-04-27: 150 ug via INTRAVENOUS
  Administered 2018-04-27: 50 ug via INTRAVENOUS
  Administered 2018-04-27: 150 ug via INTRAVENOUS
  Administered 2018-04-27 (×2): 100 ug via INTRAVENOUS

## 2018-04-27 MED ORDER — DOCUSATE SODIUM 100 MG PO CAPS
200.0000 mg | ORAL_CAPSULE | Freq: Every day | ORAL | Status: DC
  Administered 2018-04-28 – 2018-05-01 (×4): 200 mg via ORAL
  Filled 2018-04-27 (×6): qty 2

## 2018-04-27 MED ORDER — LACTATED RINGERS IV SOLN: 500.0000 mL | Freq: Once | INTRAVENOUS | Status: DC | PRN

## 2018-04-27 MED ORDER — HEMOSTATIC AGENTS (NO CHARGE) OPTIME
TOPICAL | Status: DC | PRN
  Administered 2018-04-27 (×3): 1 via TOPICAL

## 2018-04-27 MED ORDER — PHENYLEPHRINE HCL 10 MG/ML IJ SOLN
INTRAMUSCULAR | Status: DC | PRN
  Administered 2018-04-27: 40 ug via INTRAVENOUS
  Administered 2018-04-27: 80 ug via INTRAVENOUS

## 2018-04-27 MED ORDER — PROTAMINE SULFATE 10 MG/ML IV SOLN
INTRAVENOUS | Status: DC | PRN
  Administered 2018-04-27: 20 mg via INTRAVENOUS
  Administered 2018-04-27 (×4): 30 mg via INTRAVENOUS
  Administered 2018-04-27: 50 mg via INTRAVENOUS
  Administered 2018-04-27 (×4): 30 mg via INTRAVENOUS
  Administered 2018-04-27: 40 mg via INTRAVENOUS

## 2018-04-27 MED ORDER — LIDOCAINE 2% (20 MG/ML) 5 ML SYRINGE
INTRAMUSCULAR | Status: AC
  Filled 2018-04-27: qty 5

## 2018-04-27 MED ORDER — LACTATED RINGERS IV SOLN
INTRAVENOUS | Status: DC | PRN
  Administered 2018-04-27: 07:00:00 via INTRAVENOUS

## 2018-04-27 MED ORDER — FENTANYL CITRATE (PF) 250 MCG/5ML IJ SOLN
INTRAMUSCULAR | Status: AC
  Filled 2018-04-27: qty 25

## 2018-04-27 MED ORDER — SODIUM CHLORIDE 0.9 % IV SOLN: INTRAVENOUS | Status: DC

## 2018-04-27 MED ORDER — METOPROLOL TARTRATE 12.5 MG HALF TABLET
12.5000 mg | ORAL_TABLET | Freq: Two times a day (BID) | ORAL | Status: DC
  Administered 2018-04-28 – 2018-04-29 (×3): 12.5 mg via ORAL
  Filled 2018-04-27 (×4): qty 1

## 2018-04-27 MED ORDER — ONDANSETRON HCL 4 MG/2ML IJ SOLN
4.0000 mg | Freq: Four times a day (QID) | INTRAMUSCULAR | Status: DC | PRN
  Administered 2018-04-27: 4 mg via INTRAVENOUS
  Filled 2018-04-27: qty 2

## 2018-04-27 SURGICAL SUPPLY — 107 items
ADAPTER CARDIO PERF ANTE/RETRO (ADAPTER) ×1 IMPLANT
ADH SKN CLS APL DERMABOND .7 (GAUZE/BANDAGES/DRESSINGS) ×2
ADPR PRFSN 84XANTGRD RTRGD (ADAPTER) ×2
BAG DECANTER FOR FLEXI CONT (MISCELLANEOUS) ×6 IMPLANT
BANDAGE ACE 4X5 VEL STRL LF (GAUZE/BANDAGES/DRESSINGS) ×3 IMPLANT
BANDAGE ACE 6X5 VEL STRL LF (GAUZE/BANDAGES/DRESSINGS) ×3 IMPLANT
BASKET HEART (ORDER IN 25'S) (MISCELLANEOUS) ×1
BASKET HEART (ORDER IN 25S) (MISCELLANEOUS) ×2 IMPLANT
BLADE CLIPPER SURG (BLADE) IMPLANT
BLADE STERNUM SYSTEM 6 (BLADE) ×3 IMPLANT
BLADE SURG 11 STRL SS (BLADE) ×1 IMPLANT
BNDG GAUZE ELAST 4 BULKY (GAUZE/BANDAGES/DRESSINGS) ×3 IMPLANT
CANISTER SUCT 3000ML PPV (MISCELLANEOUS) ×3 IMPLANT
CANNULA EZ GLIDE AORTIC 21FR (CANNULA) ×5 IMPLANT
CANNULA GUNDRY RCSP 15FR (MISCELLANEOUS) ×1 IMPLANT
CATH CPB KIT OWEN (MISCELLANEOUS) ×3 IMPLANT
CATH THORACIC 36FR (CATHETERS) ×3 IMPLANT
CLIP FOGARTY SPRING 6M (CLIP) ×1 IMPLANT
CLIP VESOCCLUDE MED 24/CT (CLIP) IMPLANT
CLIP VESOCCLUDE SM WIDE 24/CT (CLIP) IMPLANT
CONN ST 1/4X3/8  BEN (MISCELLANEOUS) ×1
CONN ST 1/4X3/8 BEN (MISCELLANEOUS) IMPLANT
CRADLE DONUT ADULT HEAD (MISCELLANEOUS) ×3 IMPLANT
DERMABOND ADVANCED (GAUZE/BANDAGES/DRESSINGS) ×1
DERMABOND ADVANCED .7 DNX12 (GAUZE/BANDAGES/DRESSINGS) IMPLANT
DRAIN CHANNEL 32F RND 10.7 FF (WOUND CARE) ×6 IMPLANT
DRAPE CARDIOVASCULAR INCISE (DRAPES) ×3
DRAPE INCISE IOBAN 66X45 STRL (DRAPES) ×3 IMPLANT
DRAPE SLUSH/WARMER DISC (DRAPES) ×3 IMPLANT
DRAPE SRG 135X102X78XABS (DRAPES) ×2 IMPLANT
DRSG AQUACEL AG ADV 3.5X14 (GAUZE/BANDAGES/DRESSINGS) ×1 IMPLANT
DRSG COVADERM 4X14 (GAUZE/BANDAGES/DRESSINGS) ×3 IMPLANT
ELECT BLADE 4.0 EZ CLEAN MEGAD (MISCELLANEOUS) ×3
ELECT REM PT RETURN 9FT ADLT (ELECTROSURGICAL) ×6
ELECTRODE BLDE 4.0 EZ CLN MEGD (MISCELLANEOUS) ×2 IMPLANT
ELECTRODE REM PT RTRN 9FT ADLT (ELECTROSURGICAL) ×4 IMPLANT
FELT TEFLON 1X6 (MISCELLANEOUS) ×6 IMPLANT
GAUZE SPONGE 4X4 12PLY STRL (GAUZE/BANDAGES/DRESSINGS) ×6 IMPLANT
GLOVE BIO SURGEON STRL SZ 6.5 (GLOVE) ×6 IMPLANT
GLOVE BIO SURGEON STRL SZ8.5 (GLOVE) ×1 IMPLANT
GLOVE ORTHO TXT STRL SZ7.5 (GLOVE) ×7 IMPLANT
GOWN STRL REUS W/ TWL LRG LVL3 (GOWN DISPOSABLE) ×8 IMPLANT
GOWN STRL REUS W/TWL LRG LVL3 (GOWN DISPOSABLE) ×24
HEMOSTAT POWDER SURGIFOAM 1G (HEMOSTASIS) ×9 IMPLANT
INSERT FOGARTY XLG (MISCELLANEOUS) ×3 IMPLANT
KIT BASIN OR (CUSTOM PROCEDURE TRAY) ×3 IMPLANT
KIT SUCTION CATH 14FR (SUCTIONS) ×9 IMPLANT
KIT TURNOVER KIT B (KITS) ×3 IMPLANT
KIT VASOVIEW HEMOPRO VH 3000 (KITS) ×3 IMPLANT
LEAD PACING MYOCARDI (MISCELLANEOUS) ×4 IMPLANT
MARKER GRAFT CORONARY BYPASS (MISCELLANEOUS) ×9 IMPLANT
NS IRRIG 1000ML POUR BTL (IV SOLUTION) ×15 IMPLANT
PACK E OPEN HEART (SUTURE) ×3 IMPLANT
PACK OPEN HEART (CUSTOM PROCEDURE TRAY) ×3 IMPLANT
PAD ARMBOARD 7.5X6 YLW CONV (MISCELLANEOUS) ×6 IMPLANT
PAD ELECT DEFIB RADIOL ZOLL (MISCELLANEOUS) ×3 IMPLANT
PENCIL BUTTON HOLSTER BLD 10FT (ELECTRODE) ×3 IMPLANT
PUNCH AORTIC ROTATE  4.5MM 8IN (MISCELLANEOUS) ×1 IMPLANT
PUNCH AORTIC ROTATE 4.0MM (MISCELLANEOUS) IMPLANT
PUNCH AORTIC ROTATE 4.5MM 8IN (MISCELLANEOUS) IMPLANT
PUNCH AORTIC ROTATE 5MM 8IN (MISCELLANEOUS) IMPLANT
SET CARDIOPLEGIA MPS 5001102 (MISCELLANEOUS) ×1 IMPLANT
SOLUTION ANTI FOG 6CC (MISCELLANEOUS) IMPLANT
SPONGE LAP 18X18 X RAY DECT (DISPOSABLE) ×1 IMPLANT
SPONGE LAP 4X18 RFD (DISPOSABLE) IMPLANT
SUT BONE WAX W31G (SUTURE) ×3 IMPLANT
SUT ETHIBOND X763 2 0 SH 1 (SUTURE) ×6 IMPLANT
SUT MNCRL AB 3-0 PS2 18 (SUTURE) ×6 IMPLANT
SUT MNCRL AB 4-0 PS2 18 (SUTURE) ×1 IMPLANT
SUT PDS AB 1 CTX 36 (SUTURE) ×6 IMPLANT
SUT PROLENE 2 0 SH DA (SUTURE) IMPLANT
SUT PROLENE 3 0 SH DA (SUTURE) ×3 IMPLANT
SUT PROLENE 3 0 SH1 36 (SUTURE) ×2 IMPLANT
SUT PROLENE 4 0 RB 1 (SUTURE) ×9
SUT PROLENE 4 0 SH DA (SUTURE) ×1 IMPLANT
SUT PROLENE 4-0 RB1 .5 CRCL 36 (SUTURE) IMPLANT
SUT PROLENE 5 0 C 1 36 (SUTURE) IMPLANT
SUT PROLENE 6 0 C 1 30 (SUTURE) ×3 IMPLANT
SUT PROLENE 7.0 RB 3 (SUTURE) ×13 IMPLANT
SUT PROLENE 8 0 BV175 6 (SUTURE) ×3 IMPLANT
SUT PROLENE BLUE 7 0 (SUTURE) ×3 IMPLANT
SUT PROLENE POLY MONO (SUTURE) ×2 IMPLANT
SUT SILK  1 MH (SUTURE) ×4
SUT SILK 1 MH (SUTURE) ×2 IMPLANT
SUT SILK 2 0 SH CR/8 (SUTURE) ×2 IMPLANT
SUT STEEL 6MS V (SUTURE) ×1 IMPLANT
SUT STEEL STERNAL CCS#1 18IN (SUTURE) IMPLANT
SUT STEEL SZ 6 DBL 3X14 BALL (SUTURE) ×2 IMPLANT
SUT VIC AB 1 CTX 36 (SUTURE)
SUT VIC AB 1 CTX36XBRD ANBCTR (SUTURE) IMPLANT
SUT VIC AB 2-0 CT1 27 (SUTURE) ×3
SUT VIC AB 2-0 CT1 TAPERPNT 27 (SUTURE) IMPLANT
SUT VIC AB 2-0 CTX 27 (SUTURE) IMPLANT
SUT VIC AB 3-0 SH 27 (SUTURE)
SUT VIC AB 3-0 SH 27X BRD (SUTURE) IMPLANT
SUT VIC AB 3-0 X1 27 (SUTURE) IMPLANT
SUT VICRYL 4-0 PS2 18IN ABS (SUTURE) IMPLANT
SYSTEM SAHARA CHEST DRAIN ATS (WOUND CARE) ×3 IMPLANT
TAPE CLOTH SURG 4X10 WHT LF (GAUZE/BANDAGES/DRESSINGS) ×1 IMPLANT
TAPE PAPER 2X10 WHT MICROPORE (GAUZE/BANDAGES/DRESSINGS) ×1 IMPLANT
TOWEL GREEN STERILE (TOWEL DISPOSABLE) ×3 IMPLANT
TOWEL GREEN STERILE FF (TOWEL DISPOSABLE) ×3 IMPLANT
TRAY FOLEY SLVR 16FR TEMP STAT (SET/KITS/TRAYS/PACK) ×3 IMPLANT
TUBING INSUFFLATION (TUBING) ×3 IMPLANT
UNDERPAD 30X30 (UNDERPADS AND DIAPERS) ×3 IMPLANT
WATER STERILE IRR 1000ML POUR (IV SOLUTION) ×6 IMPLANT
YANKAUER SUCT BULB TIP NO VENT (SUCTIONS) ×1 IMPLANT

## 2018-04-27 NOTE — Anesthesia Procedure Notes (Signed)
Central Venous Catheter Insertion Performed by: Annye Asa, MD, anesthesiologist Start/End6/28/2019 6:32 AM, 04/27/2018 6:50 AM Patient location: Pre-op. Preanesthetic checklist: patient identified, IV checked, site marked, risks and benefits discussed, surgical consent, monitors and equipment checked, pre-op evaluation, timeout performed and anesthesia consent Position: Trendelenburg Lidocaine 1% used for infiltration and patient sedated Hand hygiene performed , maximum sterile barriers used  and Seldinger technique used Catheter size: 8.5 Fr PA cath was placed.Sheath introducer Swan type:thermodilution Procedure performed using ultrasound guided technique. Ultrasound Notes:anatomy identified, needle tip was noted to be adjacent to the nerve/plexus identified, no ultrasound evidence of intravascular and/or intraneural injection and image(s) printed for medical record Attempts: 1 Following insertion, line sutured, dressing applied and Biopatch. Post procedure assessment: blood return through all ports, free fluid flow and no air  Patient tolerated the procedure well with no immediate complications. Additional procedure comments: PA catheter:  Routine monitors. Timeout, sterile prep, drape, FBP R neck.  Trendelenburg position.  1% Lido local, finder and trocar RIJ 1st pass with US guidance.  Cordis placed over J wire. PA catheter in easily.  Sterile dressing applied.  Patient tolerated well, VSS.  Jenita Seashore, MD.

## 2018-04-27 NOTE — Transfer of Care (Signed)
Immediate Anesthesia Transfer of Care Note  Patient: [name]  Procedure(s) Performed: CORONARY ARTERY BYPASS GRAFTING (CABG) x Three , using left internal mammary artery and right leg greater saphenous vein (N/A Chest) TRANSESOPHAGEAL ECHOCARDIOGRAM (TEE) (N/A )  Patient Location: SICU  Anesthesia Type:General  Level of Consciousness: sedated, unresponsive and Patient remains intubated per anesthesia plan  Airway & Oxygen Therapy: Patient remains intubated per anesthesia plan and Patient placed on Ventilator (see vital sign flow sheet for setting)  Post-op Assessment: Report given to RN and Post -op Vital signs reviewed and stable  Post vital signs: Reviewed and stable  Last Vitals:  Vitals Value Taken Time  BP    Temp    Pulse    Resp    SpO2      Last Pain:  Vitals:   04/27/18 0341  TempSrc: Oral  PainSc:       Patients Stated Pain Goal: 0 (34/74/25 9563)  Complications: No apparent anesthesia complications

## 2018-04-27 NOTE — Anesthesia Procedure Notes (Signed)
Procedure Name: Intubation Date/Time: 04/27/2018 7:58 AM Performed by: Harden Mo, CRNA Pre-anesthesia Checklist: Patient identified, Emergency Drugs available, Suction available and Patient being monitored Patient Re-evaluated:Patient Re-evaluated prior to induction Oxygen Delivery Method: Circle System Utilized Preoxygenation: Pre-oxygenation with 100% oxygen Induction Type: IV induction Ventilation: Mask ventilation with difficulty, Two handed mask ventilation required and Oral airway inserted - appropriate to patient size Laryngoscope Size: Sabra Heck and 2 Grade View: Grade I Tube type: Oral Tube size: 8.0 mm Number of attempts: 1 Airway Equipment and Method: Stylet and Oral airway Placement Confirmation: ETT inserted through vocal cords under direct vision,  positive ETCO2 and breath sounds checked- equal and bilateral Secured at: 23 cm Tube secured with: Tape Dental Injury: Teeth and Oropharynx as per pre-operative assessment

## 2018-04-27 NOTE — Progress Notes (Signed)
  Echocardiogram Echocardiogram Transesophageal has been performed.  Merrie Roof F 04/27/2018, 9:42 AM

## 2018-04-27 NOTE — Progress Notes (Signed)
      [address]       [place] [address]             [phone]     CARDIOTHORACIC SURGERY PROGRESS NOTE  Subjective: IGNACIO LOWDER has been scheduled for Procedure(s): CORONARY ARTERY BYPASS GRAFTING (CABG) (N/A) TRANSESOPHAGEAL ECHOCARDIOGRAM (TEE) (N/A) today.   Objective: Vital signs in last 24 hours: Temp:  [97.6 F (36.4 C)-98.6 F (37 C)] 97.8 F (36.6 C) (06/28 0341) Pulse Rate:  [61-67] 61 (06/28 0341) Cardiac Rhythm: Normal sinus rhythm (06/28 0420) Resp:  [16-21] 16 (06/28 0341) BP: (112-142)/(59-89) 142/83 (06/28 0550) SpO2:  [96 %-100 %] 98 % (06/28 0341) Weight:  [280 lb 14.4 oz (127.4 kg)] 280 lb 14.4 oz (127.4 kg) (06/28 0552)  Physical Exam: Unchanged from previously   Intake/Output from previous day: 06/27 0701 - 06/28 0700 In: 1097.3 [P.O.:960; I.V.:137.3] Out: -  Intake/Output this shift: Total I/O In: 71.9 [I.V.:71.9] Out: -   Lab Results: Recent Labs    04/24/18 1033 04/25/18 0358  WBC 7.0 6.1  HGB 13.7 12.3*  HCT 42.3 38.3*  PLT 127* 118*   BMET:  Recent Labs    04/24/18 1033 04/25/18 0358  NA  --  141  K  --  3.5  CL  --  103  CO2  --  28  GLUCOSE  --  90  BUN  --  14  CREATININE 1.03 0.98  CALCIUM  --  8.5*    CBG (last 3)  Recent Labs    04/27/18 0518  GLUCAP 94   PT/INR:   Recent Labs    04/25/18 0358  LABPROT 13.5  INR 1.03    Assessment/Plan:   The various methods of treatment have been discussed with the patient. After consideration of the risks, benefits and treatment options the patient has consented to the planned procedure.   The patient has been seen and labs reviewed. There are no changes in the patient's condition to prevent proceeding with the planned procedure today.   [name], MD 04/27/2018 6:28 AM

## 2018-04-27 NOTE — Op Note (Signed)
CARDIOTHORACIC SURGERY OPERATIVE NOTE  Date of Procedure: 04/27/2018  Preoperative Diagnosis: Severe 3-vessel Coronary Artery Disease  Postoperative Diagnosis: Same  Procedure:    Coronary Artery Bypass Grafting x 3  Left Internal Mammary Artery to Distal Left Anterior Descending Coronary Artery  Saphenous Vein Graft to First Obtuse Marginal Branch of Left Circumflex Coronary Artery  Saphenous Vein Graft to Second Obtuse Marginal Branch of Left Circumflex Coronary Artery  Endoscopic Vein Harvest from Right Thigh and Lower Leg  Surgeon: Valentina Gu. Roxy Manns, MD  Assistant: Nani Skillern, PA-C  Anesthesia: Nolon Nations, MD  Operative Findings:  Mild-moderate left ventricular systolic dysfunction  Good quality left internal mammary artery conduit  Good quality saphenous vein conduit  Fair quality target vessels for grafting  Terminal branches of right coronary artery too diseased for grafting    BRIEF CLINICAL NOTE AND INDICATIONS FOR SURGERY  Patient is a 67 year old obese white male with long-standing history of coronary artery disease status post acute myocardial infarction in the distant past and status post multiple previous PCI and stent procedures, hypertension, hyperlipidemia, ITP, chronic back pain for which the patient is followed in the pain clinic, limited mobility, and hypothyroidism who describes a 1 year history of symptoms of exertional shortness of breath and lower extremity edema and more recent development of symptoms of exertional substernal chest pain consistent with angina pectoris.   DETAILS OF THE OPERATIVE PROCEDURE  Preparation:  The patient is brought to the operating room on the above mentioned date and central monitoring was established by the anesthesia team including placement of Swan-Ganz catheter and radial arterial line. The patient is placed in the supine position on the operating table.  Intravenous antibiotics are administered.  General endotracheal anesthesia is induced uneventfully. A Foley catheter is placed.  Baseline transesophageal echocardiogram was performed.  Findings were notable for mild-moderate LV systolic dysfunction with EF estimated 45%.  There was mild aortic insufficiency and mild mitral regurgitation.  The patient's chest, abdomen, both groins, and both lower extremities are prepared and draped in a sterile manner. A time out procedure is performed.   Surgical Approach and Conduit Harvest:  A median sternotomy incision was performed and the left internal mammary artery is dissected from the chest wall and prepared for bypass grafting. The left internal mammary artery is notably good quality conduit. Simultaneously, the greater saphenous vein is obtained from the patient's right thigh and leg using endoscopic vein harvest technique. The saphenous vein is notably good quality conduit. After removal of the saphenous vein, the small surgical incisions in the lower extremity are closed with absorbable suture. Following systemic heparinization, the left internal mammary artery was transected distally noted to have excellent flow.   Extracorporeal Cardiopulmonary Bypass and Myocardial Protection:  The pericardium is opened. The ascending aorta is normal in appearance. The ascending aorta and the right atrium are cannulated for cardiopulmonary bypass.  Adequate heparinization is verified.   A retrograde cardioplegia cannula is placed through the right atrium into the coronary sinus.  The entire pre-bypass portion of the operation was notable for stable hemodynamics.  Cardiopulmonary bypass was begun and the surface of the heart is inspected. Distal target vessels are selected for coronary artery bypass grafting. A cardioplegia cannula is placed in the ascending aorta.  A temperature probe was placed in the interventricular septum.  The patient is allowed to cool passively to [facility] systemic temperature.  The  aortic cross clamp is applied and cold blood cardioplegia is delivered initially in an antegrade  fashion through the aortic root.   Supplemental cardioplegia is given retrograde through the coronary sinus catheter.  Iced saline slush is applied for topical hypothermia.  The initial cardioplegic arrest is rapid with early diastolic arrest.  Repeat doses of cardioplegia are administered intermittently throughout the entire cross clamp portion of the operation through the aortic root, through the coronary sinus catheter, and through subsequently placed vein grafts in order to maintain completely flat electrocardiogram and septal myocardial temperature below 15C.  Myocardial protection was felt to be excellent.  Coronary Artery Bypass Grafting: .  The first obtuse marginal branch of the left circumflex coronary artery was grafted using a reversed saphenous vein graft in an end-to-side fashion.  At the site of distal anastomosis the target vessel was fair quality and measured approximately 1.5 mm in diameter.  The second obtuse marginal branch of the left circumflex coronary artery was grafted using a reversed saphenous vein graft in an end-to-side fashion.  At the site of distal anastomosis the target vessel was fair quality and measured approximately 1.4 mm in diameter.  The distal left anterior coronary artery was grafted with the left internal mammary artery in an end-to-side fashion.  At the site of distal anastomosis the target vessel was good quality and measured approximately 2.0 mm in diameter.  All proximal vein graft anastomoses were placed directly to the ascending aorta prior to removal of the aortic cross clamp.  The septal myocardial temperature rose rapidly after reperfusion of the left internal mammary artery graft.  The aortic cross clamp was removed after a total cross clamp time of 71 minutes.   Procedure Completion:  All proximal and distal coronary anastomoses were inspected for  hemostasis and appropriate graft orientation. Epicardial pacing wires are fixed to the right ventricular outflow tract and to the right atrial appendage. The patient is rewarmed to 37C temperature. The patient is weaned and disconnected from cardiopulmonary bypass.  The patient's rhythm at separation from bypass was sinus.  The patient was weaned from cardiopulmonary bypass without any inotropic support. Total cardiopulmonary bypass time for the operation was 102 minutes.  Followup transesophageal echocardiogram performed after separation from bypass revealed no changes from the preoperative exam.  The aortic and venous cannula were removed uneventfully. Protamine was administered to reverse the anticoagulation. The mediastinum and pleural space were inspected for hemostasis and irrigated with saline solution. The mediastinum and the left pleural space were drained using 3 chest tubes placed through separate stab incisions inferiorly.  The soft tissues anterior to the aorta were reapproximated loosely. The sternum is closed with double strength sternal wire. The soft tissues anterior to the sternum were closed in multiple layers and the skin is closed with a running subcuticular skin closure.  The post-bypass portion of the operation was notable for stable rhythm and hemodynamics.  No blood products were administered during the operation.   Disposition:  The patient tolerated the procedure well and is transported to the surgical intensive care in stable condition. There are no intraoperative complications. All sponge instrument and needle counts are verified correct at completion of the operation.    Valentina Gu. Roxy Manns MD 04/27/2018 1:02 PM

## 2018-04-27 NOTE — Progress Notes (Signed)
VC 987, NIF -28

## 2018-04-27 NOTE — Anesthesia Procedure Notes (Signed)
Arterial Line Insertion Start/End6/28/2019 6:40 AM, 04/27/2018 6:55 AM Performed by: Harden Mo, CRNA, CRNA  Patient location: Pre-op. Preanesthetic checklist: patient identified, IV checked, site marked, risks and benefits discussed, surgical consent, monitors and equipment checked, pre-op evaluation and anesthesia consent Lidocaine 1% used for infiltration and patient sedated Left, radial was placed Catheter size: 20 G Hand hygiene performed  and maximum sterile barriers used   Attempts: 1 Procedure performed without using ultrasound guided technique. Ultrasound Notes:anatomy identified, needle tip was noted to be adjacent to the nerve/plexus identified and no ultrasound evidence of intravascular and/or intraneural injection Following insertion, dressing applied and Biopatch. Post procedure assessment: normal  Patient tolerated the procedure well with no immediate complications.

## 2018-04-27 NOTE — Progress Notes (Signed)
Patient ID: [name], male   DOB: 1951/10/21, 67 y.o.   MRN: [id number] EVENING ROUNDS NOTE :     [address]       [place] [address]             [phone]                 Day of Surgery Procedure(s) (LRB): CORONARY ARTERY BYPASS GRAFTING (CABG) x Three , using left internal mammary artery and right leg greater saphenous vein (N/A) TRANSESOPHAGEAL ECHOCARDIOGRAM (TEE) (N/A)  Total Length of Stay:  LOS: 3 days  BP (!) 89/61   Pulse 83   Temp 99.3 F (37.4 C)   Resp (!) 26   Ht 5\' 11"  (1.803 m)   Wt 280 lb 14.4 oz (127.4 kg)   SpO2 97%   BMI 39.18 kg/m   .Intake/Output      06/28 0701 - 06/29 0700   P.O. 240   I.V. (mL/kg) 3473.1 (27.3)   Blood 355   NG/GT 30   IV Piggyback 963.2   Total Intake(mL/kg) 5061.3 (39.7)   Urine (mL/kg/hr) 2415 (1.2)   Blood 610   Chest Tube 350   Total Output 3375   Net +1686.3         . sodium chloride Stopped (04/27/18 2130)  . sodium chloride 100 mL/hr at 04/27/18 2200  . [START ON 04/28/2018] sodium chloride    . sodium chloride 20 mL/hr (04/27/18 1500)  . albumin human 250 mL (04/27/18 1754)  . dexmedetomidine (PRECEDEX) IV infusion    . famotidine (PEPCID) IV 20 mg (04/27/18 2238)  . lactated ringers    . lactated ringers 10 mL/hr (04/27/18 2237)  . lactated ringers    . [START ON 04/28/2018] levofloxacin (LEVAQUIN) IV    . nitroGLYCERIN    . phenylephrine (NEO-SYNEPHRINE) Adult infusion 45 mcg/min (04/27/18 2200)     Lab Results  Component Value Date   WBC 10.3 04/27/2018   HGB 8.5 (L) 04/27/2018   HCT 25.0 (L) 04/27/2018   PLT 102 (L) 04/27/2018   GLUCOSE 118 (H) 04/27/2018   CHOL 149 04/25/2018   TRIG 125 04/25/2018   HDL 26 (L) 04/25/2018   LDLCALC 98 04/25/2018   ALT 12 04/25/2018   AST 13 (L) 04/25/2018   NA 141 04/27/2018   K 4.5 04/27/2018   CL 106 04/27/2018   CREATININE 0.60 (L) 04/27/2018   BUN 5 (L) 04/27/2018   CO2 28 04/25/2018   TSH 4.410 06/06/2016   PSA 0.47 07/05/2013    INR 1.27 04/27/2018   HGBA1C 5.6 04/25/2018   Stable day Off vent hemodynamics stable   Grace Isaac MD  Beeper [phone] Office [phone] 04/27/2018 11:01 PM

## 2018-04-27 NOTE — Consult Note (Signed)
            [facility] CM Primary Care Navigator  04/27/2018  [name] [birth year] [id number]   Attempt to see patient at the bedside to identify possible discharge needs butstaffreports that patient is off the unit in the OR for surgery at this time. (coronary artery bypass graft x 3)   Will attempt to see patient at another time when available in the room.    Addendum:  Went back tosee patient at the bedside to identify possible discharge needs butpatientwastransferred from2C14 to 2H07(cardiac ICU) after surgery.  Will attempt to see patient at another time when out of ICU.    For additional questions please contact:  Edwena Felty A. Denita Lun, BSN, RN-BC [facility] PRIMARY CARE Navigator Cell: [phone]

## 2018-04-27 NOTE — Procedures (Signed)
Extubation Procedure Note  Patient Details:   Name: [name] DOB: May 27, 1951 MRN: [id number]   Airway Documentation:    Vent end date: 04/27/18 Vent end time: 1635   Evaluation  O2 sats: stable throughout Complications: No apparent complications Patient did tolerate procedure well. Bilateral Breath Sounds: Clear   Yes, Placed on 4L [place]   Gonzella Lex 04/27/2018, 4:41 PM

## 2018-04-27 NOTE — OR Nursing (Signed)
12:20pm - 45 minute call to SICU charge nurse 12:50pm - 20 minute call to SICU charge nurse

## 2018-04-27 NOTE — Brief Op Note (Signed)
04/24/2018 - 04/27/2018  12:59 PM  PATIENT:  [name]  67 y.o. male  PRE-OPERATIVE DIAGNOSIS:  CAD  POST-OPERATIVE DIAGNOSIS:  CAD  PROCEDURE:  TRANSESOPHAGEAL ECHOCARDIOGRAM (TEE), MEDIAN STERNOTOMY for CORONARY ARTERY BYPASS GRAFTING (CABG) x 3 (LIMA to LAD, SVG to OM1, SVG to OM2)  , using left internal mammary artery and right leg greater saphenous vein   FINDINGS: RCA with stent and extensive plauqu and not graftable  SURGEON:  Surgeon(s) and Role:    [name], MD - Primary  PHYSICIAN ASSISTANT: Lars Pinks PA-C  ASSISTANTS: Lonia Mad RNFA  ANESTHESIA:   general  EBL:  610 mL   DRAINS: Chest tubes placed in the mediastinal and pleural spaces   COUNTS CORRECT:  YES  DICTATION: .Dragon Dictation  PLAN OF CARE: Admit to inpatient   PATIENT DISPOSITION:  ICU - intubated and hemodynamically stable.   Delay start of Pharmacological VTE agent (>24hrs) due to surgical blood loss or risk of bleeding: yes  BASELINE WEIGHT: 127 kg

## 2018-04-28 ENCOUNTER — Inpatient Hospital Stay (HOSPITAL_COMMUNITY): Payer: Medicare Other

## 2018-04-28 DIAGNOSIS — Z951 Presence of aortocoronary bypass graft: Secondary | ICD-10-CM

## 2018-04-28 LAB — MAGNESIUM
MAGNESIUM: 2.6 mg/dL — AB (ref 1.7–2.4)
MAGNESIUM: 2.4 mg/dL (ref 1.7–2.4)

## 2018-04-28 LAB — BASIC METABOLIC PANEL
ANION GAP: 5 (ref 5–15)
BUN: 6 mg/dL — ABNORMAL LOW (ref 8–23)
CHLORIDE: 109 mmol/L (ref 98–111)
CO2: 25 mmol/L (ref 22–32)
Calcium: 7.2 mg/dL — ABNORMAL LOW (ref 8.9–10.3)
Creatinine, Ser: 0.77 mg/dL (ref 0.61–1.24)
GFR calc Af Amer: >60 mL/min (ref >60)
GLUCOSE: 109 mg/dL — AB (ref 70–99)
POTASSIUM: 4.2 mmol/L (ref 3.5–5.1)
Sodium: 139 mmol/L (ref 135–145)

## 2018-04-28 LAB — POCT I-STAT 3, ART BLOOD GAS (G3+)
Acid-base deficit: 2 mmol/L (ref 0.0–2.0)
BICARBONATE: 24 mmol/L (ref 20.0–28.0)
O2 SAT: 98 %
PCO2 ART: 45.7 mmHg (ref 32.0–48.0)
PH ART: 7.332 — AB (ref 7.350–7.450)
PO2 ART: 117 mmHg — AB (ref 83.0–108.0)
Patient temperature: 37.7
TCO2: 25 mmol/L (ref 22–32)

## 2018-04-28 LAB — CBC
HCT: 26.2 % — ABNORMAL LOW (ref 39.0–52.0)
HCT: 30.8 % — ABNORMAL LOW (ref 39.0–52.0)
HEMOGLOBIN: 8.3 g/dL — AB (ref 13.0–17.0)
Hemoglobin: 9.8 g/dL — ABNORMAL LOW (ref 13.0–17.0)
MCH: 30.4 pg (ref 26.0–34.0)
MCH: 30.5 pg (ref 26.0–34.0)
MCHC: 31.7 g/dL (ref 30.0–36.0)
MCHC: 31.8 g/dL (ref 30.0–36.0)
MCV: 96 fL (ref 78.0–100.0)
MCV: 96 fL (ref 78.0–100.0)
PLATELETS: 108 10*3/uL — AB (ref 150–400)
Platelets: 79 10*3/uL — ABNORMAL LOW (ref 150–400)
RBC: 2.73 MIL/uL — AB (ref 4.22–5.81)
RBC: 3.21 MIL/uL — ABNORMAL LOW (ref 4.22–5.81)
RDW: 14.1 % (ref 11.5–15.5)
RDW: 14.1 % (ref 11.5–15.5)
WBC: 6.1 10*3/uL (ref 4.0–10.5)
WBC: 11.3 10*3/uL — AB (ref 4.0–10.5)

## 2018-04-28 LAB — CREATININE, SERUM: Creatinine, Ser: 0.96 mg/dL (ref 0.61–1.24)

## 2018-04-28 LAB — GLUCOSE, CAPILLARY
GLUCOSE-CAPILLARY: 108 mg/dL — AB (ref 70–99)
Glucose-Capillary: 104 mg/dL — ABNORMAL HIGH (ref 70–99)

## 2018-04-28 LAB — POCT I-STAT, CHEM 8
BUN: 7 mg/dL — ABNORMAL LOW (ref 8–23)
CHLORIDE: 102 mmol/L (ref 98–111)
Calcium, Ion: 1.14 mmol/L — ABNORMAL LOW (ref 1.15–1.40)
Creatinine, Ser: 0.7 mg/dL (ref 0.61–1.24)
GLUCOSE: 135 mg/dL — AB (ref 70–99)
HEMATOCRIT: 29 % — AB (ref 39.0–52.0)
Hemoglobin: 9.9 g/dL — ABNORMAL LOW (ref 13.0–17.0)
POTASSIUM: 4.1 mmol/L (ref 3.5–5.1)
SODIUM: 138 mmol/L (ref 135–145)
TCO2: 24 mmol/L (ref 22–32)

## 2018-04-28 MED ORDER — ENOXAPARIN SODIUM 30 MG/0.3ML ~~LOC~~ SOLN
30.0000 mg | Freq: Every day | SUBCUTANEOUS | Status: DC
  Administered 2018-04-28 – 2018-04-29 (×2): 30 mg via SUBCUTANEOUS
  Filled 2018-04-28 (×2): qty 0.3

## 2018-04-28 MED ORDER — SODIUM CHLORIDE 0.9% FLUSH
10.0000 mL | Freq: Two times a day (BID) | INTRAVENOUS | Status: DC
  Administered 2018-04-29: 10 mL

## 2018-04-28 MED ORDER — FUROSEMIDE 10 MG/ML IJ SOLN
40.0000 mg | Freq: Once | INTRAMUSCULAR | Status: AC
  Administered 2018-04-28: 40 mg via INTRAVENOUS
  Filled 2018-04-28: qty 4

## 2018-04-28 MED ORDER — SODIUM CHLORIDE 0.9% FLUSH: 10.0000 mL | INTRAVENOUS | Status: DC | PRN

## 2018-04-28 MED ORDER — CHLORHEXIDINE GLUCONATE CLOTH 2 % EX PADS
6.0000 | MEDICATED_PAD | Freq: Every day | CUTANEOUS | Status: DC
  Administered 2018-04-28: 6 via TOPICAL

## 2018-04-28 NOTE — Progress Notes (Signed)
Patient ID: [name], male   DOB: 04-17-1951, 68 y.o.   MRN: [id number] EVENING ROUNDS NOTE :     [address]       ,[place] [address]             [phone]                 1 Day Post-Op Procedure(s) (LRB): CORONARY ARTERY BYPASS GRAFTING (CABG) x Three , using left internal mammary artery and right leg greater saphenous vein (N/A) TRANSESOPHAGEAL ECHOCARDIOGRAM (TEE) (N/A)  Total Length of Stay:  LOS: 4 days  BP (!) 121/95   Pulse 86   Temp 98.7 F (37.1 C) (Oral)   Resp (!) 23   Ht 5\' 11"  (1.803 m)   Wt 293 lb 14 oz (133.3 kg)   SpO2 99%   BMI 40.99 kg/m   .Intake/Output      06/28 0701 - 06/29 0700 06/29 0701 - 06/30 0700   P.O. 1080 600   I.V. (mL/kg) 4141.8 (31.1) 111.2 (0.8)   Blood 355    NG/GT 30    IV Piggyback 1984.9    Total Intake(mL/kg) 7591.7 (57) 711.2 (5.3)   Urine (mL/kg/hr) 2695 (0.8) 1495 (0.9)   Blood 610    Chest Tube 770 140   Total Output 4075 1635   Net +3516.7 -923.8          . sodium chloride Stopped (04/28/18 0940)  . sodium chloride    . sodium chloride 20 mL/hr (04/27/18 1500)  . lactated ringers 10 mL/hr at 04/28/18 0700  . lactated ringers    . phenylephrine (NEO-SYNEPHRINE) Adult infusion Stopped (04/28/18 0700)     Lab Results  Component Value Date   WBC 11.3 (H) 04/28/2018   HGB 9.8 (L) 04/28/2018   HCT 30.8 (L) 04/28/2018   PLT 108 (L) 04/28/2018   GLUCOSE 135 (H) 04/28/2018   CHOL 149 04/25/2018   TRIG 125 04/25/2018   HDL 26 (L) 04/25/2018   LDLCALC 98 04/25/2018   ALT 12 04/25/2018   AST 13 (L) 04/25/2018   NA 138 04/28/2018   K 4.1 04/28/2018   CL 102 04/28/2018   CREATININE 0.96 04/28/2018   BUN 7 (L) 04/28/2018   CO2 25 04/28/2018   TSH 4.410 06/06/2016   PSA 0.47 07/05/2013   INR 1.27 04/27/2018   HGBA1C 5.6 04/25/2018   Stable Walked and in chair now, tubes out   Grace Isaac MD  Beeper [phone] Office [phone] 04/28/2018 6:55 PM

## 2018-04-28 NOTE — Plan of Care (Signed)
  Problem: Education: Goal: Knowledge of General Education information will improve Outcome: Progressing   Problem: Health Behavior/Discharge Planning: Goal: Ability to manage health-related needs will improve Outcome: Progressing   Problem: Clinical Measurements: Goal: Ability to maintain clinical measurements within normal limits will improve Outcome: Progressing Goal: Will remain free from infection Outcome: Progressing Goal: Diagnostic test results will improve Outcome: Progressing Goal: Respiratory complications will improve Outcome: Progressing Goal: Cardiovascular complication will be avoided Outcome: Progressing   Problem: Activity: Goal: Risk for activity intolerance will decrease Outcome: Progressing   Problem: Nutrition: Goal: Adequate nutrition will be maintained Outcome: Progressing   Problem: Coping: Goal: Level of anxiety will decrease Outcome: Progressing   Problem: Elimination: Goal: Will not experience complications related to bowel motility Outcome: Progressing Goal: Will not experience complications related to urinary retention Outcome: Progressing   Problem: Pain Managment: Goal: General experience of comfort will improve Outcome: Progressing   Problem: Safety: Goal: Ability to remain free from injury will improve Outcome: Progressing   Problem: Skin Integrity: Goal: Risk for impaired skin integrity will decrease Outcome: Progressing   Problem: Education: Goal: Ability to demonstrate proper wound care will improve Outcome: Progressing Goal: Knowledge of disease or condition will improve Outcome: Progressing Goal: Knowledge of the prescribed therapeutic regimen will improve Outcome: Progressing   Problem: Activity: Goal: Risk for activity intolerance will decrease Outcome: Progressing   Problem: Cardiac: Goal: Hemodynamic stability will improve Outcome: Progressing   Problem: Clinical Measurements: Goal: Postoperative  complications will be avoided or minimized Outcome: Progressing   Problem: Respiratory: Goal: Respiratory status will improve Outcome: Progressing   Problem: Skin Integrity: Goal: Wound healing without signs and symptoms of infection Outcome: Progressing Goal: Risk for impaired skin integrity will decrease Outcome: Progressing   Problem: Urinary Elimination: Goal: Ability to achieve and maintain adequate renal perfusion and functioning will improve Outcome: Progressing

## 2018-04-28 NOTE — Progress Notes (Signed)
Patient ID: [name], male   DOB: 04/20/1951, 67 y.o.   MRN: [id number] TCTS DAILY ICU PROGRESS NOTE                   [address]            [place] [address]          [phone]   1 Day Post-Op Procedure(s) (LRB): CORONARY ARTERY BYPASS GRAFTING (CABG) x Three , using left internal mammary artery and right leg greater saphenous vein (N/A) TRANSESOPHAGEAL ECHOCARDIOGRAM (TEE) (N/A)  Total Length of Stay:  LOS: 4 days   Subjective: Awake alert neurologically intact  Objective: Vital signs in last 24 hours: Temp:  [97 F (36.1 C)-100.2 F (37.9 C)] 99.7 F (37.6 C) (06/29 0700) Pulse Rate:  [77-90] 84 (06/29 0700) Cardiac Rhythm: Normal sinus rhythm (06/29 0820) Resp:  [0-26] 21 (06/29 0700) BP: (79-140)/(51-91) 89/61 (06/28 2030) SpO2:  [91 %-100 %] 97 % (06/29 0700) Arterial Line BP: (84-150)/(39-72) 127/51 (06/29 0700) FiO2 (%):  [40 %-50 %] 40 % (06/28 1545) Weight:  [293 lb 14 oz (133.3 kg)] 293 lb 14 oz (133.3 kg) (06/29 0500)  Filed Weights   04/24/18 0549 04/27/18 0552 04/28/18 0500  Weight: 272 lb (123.4 kg) 280 lb 14.4 oz (127.4 kg) 293 lb 14 oz (133.3 kg)    Weight change: 12 lb 15.6 oz (5.885 kg)   Hemodynamic parameters for last 24 hours: PAP: (19-48)/(6-22) 34/18 CO:  [3.7 L/min-8.3 L/min] 6.9 L/min CI:  [1.5 L/min/m2-3.4 L/min/m2] 2.8 L/min/m2  Intake/Output from previous day: 06/28 0701 - 06/29 0700 In: 7591.7 [P.O.:1080; I.V.:4141.8; Blood:355; NG/GT:30; IV Piggyback:1984.9] Out: 0932 [IZTIW:5809; Blood:610; Chest Tube:770]  Intake/Output this shift: Total I/O In: 47.8 [I.V.:47.8] Out: 90 [Urine:70; Chest Tube:20]  Current Meds: Scheduled Meds: . acetaminophen  1,000 mg Oral Q6H  . aspirin EC  325 mg Oral Daily  . atorvastatin  80 mg Oral q1800  . bisacodyl  10 mg Oral Daily   Or  . bisacodyl  10 mg Rectal Daily  . citalopram  20 mg Oral q morning - 10a  . docusate sodium  200 mg Oral Daily  . insulin aspart  0-24  Units Subcutaneous Q4H  . levothyroxine  112 mcg Oral QAC breakfast  . mouth rinse  15 mL Mouth Rinse BID  . metoprolol tartrate  12.5 mg Oral BID  . pantoprazole  40 mg Oral Daily  . sodium chloride flush  3 mL Intravenous Q12H  . tamsulosin  0.4 mg Oral Daily   Continuous Infusions: . sodium chloride 20 mL/hr at 04/28/18 0800  . sodium chloride    . sodium chloride 20 mL/hr (04/27/18 1500)  . albumin human 250 mL (04/27/18 1754)  . lactated ringers 10 mL/hr at 04/28/18 0700  . lactated ringers    . levofloxacin (LEVAQUIN) IV 750 mg (04/28/18 0939)  . phenylephrine (NEO-SYNEPHRINE) Adult infusion Stopped (04/28/18 0700)   PRN Meds:.sodium chloride, albumin human, metoprolol tartrate, morphine injection, ondansetron (ZOFRAN) IV, oxyCODONE, sodium chloride flush, traMADol  General appearance: alert Neurologic: intact Heart: regular rate and rhythm, S1, S2 normal, no murmur, click, rub or gallop Lungs: diminished breath sounds bibasilar Abdomen: soft, non-tender; bowel sounds normal; no masses,  no organomegaly Extremities: extremities normal, atraumatic, no cyanosis or edema and Homans sign is negative, no sign of DVT Wound: Dressing intact sternum stable  Lab Results: CBC: Recent Labs    04/27/18 1908 04/27/18 1909 04/28/18 0319  WBC  10.3  --  6.1  HGB 9.2* 8.5* 8.3*  HCT 29.4* 25.0* 26.2*  PLT 102*  --  79*   BMET:  Recent Labs    04/27/18 1909 04/28/18 0319  NA 141 139  K 4.5 4.2  CL 106 109  CO2  --  25  GLUCOSE 118* 109*  BUN 5* 6*  CREATININE 0.60* 0.77  CALCIUM  --  7.2*    CMET: Lab Results  Component Value Date   WBC 6.1 04/28/2018   HGB 8.3 (L) 04/28/2018   HCT 26.2 (L) 04/28/2018   PLT 79 (L) 04/28/2018   GLUCOSE 109 (H) 04/28/2018   CHOL 149 04/25/2018   TRIG 125 04/25/2018   HDL 26 (L) 04/25/2018   LDLCALC 98 04/25/2018   ALT 12 04/25/2018   AST 13 (L) 04/25/2018   NA 139 04/28/2018   K 4.2 04/28/2018   CL 109 04/28/2018   CREATININE  0.77 04/28/2018   BUN 6 (L) 04/28/2018   CO2 25 04/28/2018   TSH 4.410 06/06/2016   PSA 0.47 07/05/2013   INR 1.27 04/27/2018   HGBA1C 5.6 04/25/2018      PT/INR:  Recent Labs    04/27/18 1346  LABPROT 15.7*  INR 1.27   Radiology: Dg Chest Port 1 View  Result Date: 04/28/2018 CLINICAL DATA:  Atelectasis EXAM: PORTABLE CHEST 1 VIEW COMPARISON:  Yesterday FINDINGS: Cardiopericardial enlargement is unchanged and accentuated by rotation. Better lung volumes after extubation of the trachea and esophagus. Thoracic drains in place. Small left apical pneumothorax is newly seen. Swan-Ganz catheter from the right with tip at the main pulmonary artery. Negative for edema. Atelectasis is mild. IMPRESSION: 1. Small left apical pneumothorax, less than 10%. Left chest tube in stable position. 2. Improved inflation after extubation.  Atelectasis is mild. Electronically Signed   By: Monte Fantasia M.D.   On: 04/28/2018 07:57   Dg Chest Port 1 View  Result Date: 04/27/2018 CLINICAL DATA:  Atelectasis. EXAM: PORTABLE CHEST 1 VIEW COMPARISON:  04/17/2018 FINDINGS: Endotracheal tube terminates 5.5 cm above the carina. Right-sided internal jugular approach Swan-Ganz catheter terminates at the expected location of the main pulmonary trunk/proximal right main pulmonary artery. Enteric catheter transverses the thorax. Mediastinal drain is in place. Postsurgical changes from CABG. Cardiomediastinal silhouette is enlarged, likely exaggerated by AP portable technique. There is no evidence of pleural effusion or pneumothorax. Low lung volumes with bibasilar atelectasis. Osseous structures are without acute abnormality. Soft tissues are grossly normal. IMPRESSION: Postsurgical changes as described. Low lung volumes with bibasilar atelectasis. Electronically Signed   By: Fidela Salisbury M.D.   On: 04/27/2018 14:27     Assessment/Plan: S/P Procedure(s) (LRB): CORONARY ARTERY BYPASS GRAFTING (CABG) x Three , using  left internal mammary artery and right leg greater saphenous vein (N/A) TRANSESOPHAGEAL ECHOCARDIOGRAM (TEE) (N/A) Mobilize Diuresis d/c tubes/lines See progression orders Expected Acute  Blood - loss Anemia- continue to monitor  Renal function stable    Grace Isaac 04/28/2018 10:01 AM

## 2018-04-29 ENCOUNTER — Other Ambulatory Visit: Payer: Self-pay

## 2018-04-29 ENCOUNTER — Inpatient Hospital Stay (HOSPITAL_COMMUNITY): Payer: Medicare Other

## 2018-04-29 LAB — CBC
HCT: 28.3 % — ABNORMAL LOW (ref 39.0–52.0)
Hemoglobin: 8.8 g/dL — ABNORMAL LOW (ref 13.0–17.0)
MCH: 29.8 pg (ref 26.0–34.0)
MCHC: 31.1 g/dL (ref 30.0–36.0)
MCV: 95.9 fL (ref 78.0–100.0)
Platelets: 99 10*3/uL — ABNORMAL LOW (ref 150–400)
RBC: 2.95 MIL/uL — ABNORMAL LOW (ref 4.22–5.81)
RDW: 13.9 % (ref 11.5–15.5)
WBC: 10.9 10*3/uL — ABNORMAL HIGH (ref 4.0–10.5)

## 2018-04-29 LAB — BASIC METABOLIC PANEL
Anion gap: 3 — ABNORMAL LOW (ref 5–15)
BUN: 9 mg/dL (ref 8–23)
CO2: 28 mmol/L (ref 22–32)
Calcium: 8 mg/dL — ABNORMAL LOW (ref 8.9–10.3)
Chloride: 105 mmol/L (ref 98–111)
Creatinine, Ser: 0.89 mg/dL (ref 0.61–1.24)
GFR calc Af Amer: >60 mL/min (ref >60)
GFR calc non Af Amer: >60 mL/min (ref >60)
Glucose, Bld: 141 mg/dL — ABNORMAL HIGH (ref 70–99)
Potassium: 4 mmol/L (ref 3.5–5.1)
Sodium: 136 mmol/L (ref 135–145)

## 2018-04-29 MED ORDER — FUROSEMIDE 10 MG/ML IJ SOLN
40.0000 mg | Freq: Once | INTRAMUSCULAR | Status: AC
  Administered 2018-04-29: 40 mg via INTRAVENOUS
  Filled 2018-04-29: qty 4

## 2018-04-29 MED ORDER — MOVING RIGHT ALONG BOOK
Freq: Once | Status: AC
  Administered 2018-04-29: 08:00:00
  Filled 2018-04-29: qty 1

## 2018-04-29 MED ORDER — METOPROLOL TARTRATE 25 MG PO TABS
25.0000 mg | ORAL_TABLET | Freq: Two times a day (BID) | ORAL | Status: DC
  Administered 2018-04-29: 25 mg via ORAL

## 2018-04-29 MED ORDER — AMIODARONE IV BOLUS ONLY 150 MG/100ML
150.0000 mg | Freq: Once | INTRAVENOUS | Status: AC
  Administered 2018-04-29: 150 mg via INTRAVENOUS
  Filled 2018-04-29: qty 100

## 2018-04-29 MED ORDER — AMIODARONE HCL 200 MG PO TABS
400.0000 mg | ORAL_TABLET | Freq: Two times a day (BID) | ORAL | Status: DC
  Administered 2018-04-29: 400 mg via ORAL
  Filled 2018-04-29: qty 2

## 2018-04-29 NOTE — Anesthesia Postprocedure Evaluation (Signed)
Anesthesia Post Note  Patient: [name]  Procedure(s) Performed: CORONARY ARTERY BYPASS GRAFTING (CABG) x Three , using left internal mammary artery and right leg greater saphenous vein (N/A Chest) TRANSESOPHAGEAL ECHOCARDIOGRAM (TEE) (N/A )     Patient location during evaluation: SICU Anesthesia Type: General Level of consciousness: sedated and patient remains intubated per anesthesia plan Pain management: pain level controlled Vital Signs Assessment: post-procedure vital signs reviewed and stable Respiratory status: patient remains intubated per anesthesia plan Cardiovascular status: stable Anesthetic complications: no    Last Vitals:  Vitals:   04/29/18 2300 04/29/18 2347  BP: 125/70   Pulse: (!) 119   Resp: (!) 23   Temp:  36.6 C  SpO2: 94%     Last Pain:  Vitals:   04/29/18 2347  TempSrc: Oral  PainSc:                  Nolon Nations

## 2018-04-29 NOTE — Progress Notes (Signed)
Patient ID: [name], male   DOB: [birth year], 67 y.o.   MRN: [id number] TCTS DAILY ICU PROGRESS NOTE                   [address]            ,[place] [address]          [phone]   2 Days Post-Op Procedure(s) (LRB): CORONARY ARTERY BYPASS GRAFTING (CABG) x Three , using left internal mammary artery and right leg greater saphenous vein (N/A) TRANSESOPHAGEAL ECHOCARDIOGRAM (TEE) (N/A)  Total Length of Stay:  LOS: 5 days   Subjective: Patient awake alert neurologically intact, some shortness of breath and diaphoresis when walking today but improved when resting  Objective: Vital signs in last 24 hours: Temp:  [98.5 F (36.9 C)-100.4 F (38 C)] 100.2 F (37.9 C) (06/30 0735) Pulse Rate:  [81-97] 97 (06/30 0700) Cardiac Rhythm: Normal sinus rhythm (06/30 0400) Resp:  [18-27] 24 (06/30 0700) BP: (118-157)/(55-95) 146/58 (06/30 0600) SpO2:  [90 %-99 %] 95 % (06/30 0700) Arterial Line BP: (157)/(56) 157/56 (06/29 1000) Weight:  [291 lb 7.2 oz (132.2 kg)] 291 lb 7.2 oz (132.2 kg) (06/30 0500)  Filed Weights   04/27/18 0552 04/28/18 0500 04/29/18 0500  Weight: 280 lb 14.4 oz (127.4 kg) 293 lb 14 oz (133.3 kg) 291 lb 7.2 oz (132.2 kg)    Weight change: -2 lb 6.8 oz (-1.1 kg)   Hemodynamic parameters for last 24 hours: PAP: (37-48)/(11-18) 37/18  Intake/Output from previous day: 06/29 0701 - 06/30 0700 In: 1426.2 [P.O.:720; I.V.:706.2] Out: 1905 [Urine:1765; Chest Tube:140]  Intake/Output this shift: No intake/output data recorded.  Current Meds: Scheduled Meds: . acetaminophen  1,000 mg Oral Q6H  . aspirin EC  325 mg Oral Daily  . atorvastatin  80 mg Oral q1800  . bisacodyl  10 mg Oral Daily   Or  . bisacodyl  10 mg Rectal Daily  . Chlorhexidine Gluconate Cloth  6 each Topical Daily  . citalopram  20 mg Oral q morning - 10a  . docusate sodium  200 mg Oral Daily  . enoxaparin (LOVENOX) injection  30 mg Subcutaneous QHS  . levothyroxine  112  mcg Oral QAC breakfast  . mouth rinse  15 mL Mouth Rinse BID  . metoprolol tartrate  12.5 mg Oral BID  . moving right along book   Does not apply Once  . pantoprazole  40 mg Oral Daily  . sodium chloride flush  10-40 mL Intracatheter Q12H  . sodium chloride flush  3 mL Intravenous Q12H  . tamsulosin  0.4 mg Oral Daily   Continuous Infusions: . sodium chloride Stopped (04/28/18 0940)  . sodium chloride    . sodium chloride Stopped (04/28/18 2000)  . lactated ringers 10 mL/hr at 04/28/18 0700  . lactated ringers    . phenylephrine (NEO-SYNEPHRINE) Adult infusion Stopped (04/28/18 0700)   PRN Meds:.sodium chloride, metoprolol tartrate, morphine injection, ondansetron (ZOFRAN) IV, oxyCODONE, sodium chloride flush, sodium chloride flush, traMADol  General appearance: alert, cooperative and no distress Neurologic: intact Heart: regular rate and rhythm, S1, S2 normal, no murmur, click, rub or gallop Lungs: diminished breath sounds bibasilar Abdomen: soft, non-tender; bowel sounds normal; no masses,  no organomegaly Extremities: extremities normal, atraumatic, no cyanosis or edema and Homans sign is negative, no sign of DVT Wound: Sternum stable dressing intact  Lab Results: CBC: Recent Labs    04/28/18 1700 04/29/18 0336  WBC 11.3* 10.9*  HGB 9.8* 8.8*  HCT 30.8* 28.3*  PLT 108* 99*   BMET:  Recent Labs    04/28/18 0319 04/28/18 1654 04/28/18 1700 04/29/18 0336  NA 139 138  --  136  K 4.2 4.1  --  4.0  CL 109 102  --  105  CO2 25  --   --  28  GLUCOSE 109* 135*  --  141*  BUN 6* 7*  --  9  CREATININE 0.77 0.70 0.96 0.89  CALCIUM 7.2*  --   --  8.0*    CMET: Lab Results  Component Value Date   WBC 10.9 (H) 04/29/2018   HGB 8.8 (L) 04/29/2018   HCT 28.3 (L) 04/29/2018   PLT 99 (L) 04/29/2018   GLUCOSE 141 (H) 04/29/2018   CHOL 149 04/25/2018   TRIG 125 04/25/2018   HDL 26 (L) 04/25/2018   LDLCALC 98 04/25/2018   ALT 12 04/25/2018   AST 13 (L) 04/25/2018   NA  136 04/29/2018   K 4.0 04/29/2018   CL 105 04/29/2018   CREATININE 0.89 04/29/2018   BUN 9 04/29/2018   CO2 28 04/29/2018   TSH 4.410 06/06/2016   PSA 0.47 07/05/2013   INR 1.27 04/27/2018   HGBA1C 5.6 04/25/2018      PT/INR:  Recent Labs    04/27/18 1346  LABPROT 15.7*  INR 1.27   Radiology: No results found.   Assessment/Plan: S/P Procedure(s) (LRB): CORONARY ARTERY BYPASS GRAFTING (CABG) x Three , using left internal mammary artery and right leg greater saphenous vein (N/A) TRANSESOPHAGEAL ECHOCARDIOGRAM (TEE) (N/A) Mobilize Diuresis Diabetes control d/c tubes/lines DC central line Foley Continue to diurese Renal function stable Holding sinus rhythm   Grace Isaac 04/29/2018 7:56 AM

## 2018-04-29 NOTE — Progress Notes (Signed)
Spoke with Aldona Bar RN re d/c CVC order.  RN aware.

## 2018-04-29 NOTE — Progress Notes (Signed)
Patient ID: [name], male   DOB: 1951/05/19, 67 y.o.   MRN: [id number] EVENING ROUNDS NOTE :     [address]       [place] [address]             [phone]                 2 Days Post-Op Procedure(s) (LRB): CORONARY ARTERY BYPASS GRAFTING (CABG) x Three , using left internal mammary artery and right leg greater saphenous vein (N/A) TRANSESOPHAGEAL ECHOCARDIOGRAM (TEE) (N/A)  Total Length of Stay:  LOS: 5 days  BP 140/81   Pulse (!) 119   Temp 98.2 F (36.8 C) (Oral)   Resp (!) 21   Ht 5\' 11"  (1.803 m)   Wt 291 lb 7.2 oz (132.2 kg)   SpO2 95%   BMI 40.65 kg/m   .Intake/Output      06/30 0701 - 07/01 0700   P.O. 750   I.V. (mL/kg)    Total Intake(mL/kg) 750 (5.7)   Urine (mL/kg/hr) 1225 (0.8)   Chest Tube    Total Output 1225   Net -475         . sodium chloride Stopped (04/28/18 0940)  . sodium chloride 250 mL (04/29/18 1803)  . sodium chloride Stopped (04/28/18 2000)  . lactated ringers 10 mL/hr at 04/28/18 0700  . lactated ringers    . phenylephrine (NEO-SYNEPHRINE) Adult infusion Stopped (04/28/18 0700)     Lab Results  Component Value Date   WBC 10.9 (H) 04/29/2018   HGB 8.8 (L) 04/29/2018   HCT 28.3 (L) 04/29/2018   PLT 99 (L) 04/29/2018   GLUCOSE 141 (H) 04/29/2018   CHOL 149 04/25/2018   TRIG 125 04/25/2018   HDL 26 (L) 04/25/2018   LDLCALC 98 04/25/2018   ALT 12 04/25/2018   AST 13 (L) 04/25/2018   NA 136 04/29/2018   K 4.0 04/29/2018   CL 105 04/29/2018   CREATININE 0.89 04/29/2018   BUN 9 04/29/2018   CO2 28 04/29/2018   TSH 4.410 06/06/2016   PSA 0.47 07/05/2013   INR 1.27 04/27/2018   HGBA1C 5.6 04/25/2018    After central line out developed afib, given bolus of Cordarone  and started  on po Cordarone    Grace Isaac MD  Beeper [phone] Office [phone] 04/29/2018 7:02 PM

## 2018-04-30 ENCOUNTER — Encounter (HOSPITAL_COMMUNITY): Payer: Self-pay | Admitting: Thoracic Surgery (Cardiothoracic Vascular Surgery)

## 2018-04-30 ENCOUNTER — Inpatient Hospital Stay (HOSPITAL_COMMUNITY): Payer: Medicare Other

## 2018-04-30 DIAGNOSIS — I5042 Chronic combined systolic (congestive) and diastolic (congestive) heart failure: Secondary | ICD-10-CM

## 2018-04-30 LAB — CBC
HCT: 27.2 % — ABNORMAL LOW (ref 39.0–52.0)
Hemoglobin: 8.7 g/dL — ABNORMAL LOW (ref 13.0–17.0)
MCH: 29.8 pg (ref 26.0–34.0)
MCHC: 32 g/dL (ref 30.0–36.0)
MCV: 93.2 fL (ref 78.0–100.0)
Platelets: 106 10*3/uL — ABNORMAL LOW (ref 150–400)
RBC: 2.92 MIL/uL — ABNORMAL LOW (ref 4.22–5.81)
RDW: 13.7 % (ref 11.5–15.5)
WBC: 10.8 10*3/uL — ABNORMAL HIGH (ref 4.0–10.5)

## 2018-04-30 LAB — POCT I-STAT 4, (NA,K, GLUC, HGB,HCT)
Glucose, Bld: 142 mg/dL — ABNORMAL HIGH (ref 70–99)
HCT: 30 % — ABNORMAL LOW (ref 39.0–52.0)
Hemoglobin: 10.2 g/dL — ABNORMAL LOW (ref 13.0–17.0)
Potassium: 3.9 mmol/L (ref 3.5–5.1)
Sodium: 141 mmol/L (ref 135–145)

## 2018-04-30 LAB — BASIC METABOLIC PANEL
Anion gap: 7 (ref 5–15)
BUN: 11 mg/dL (ref 8–23)
CO2: 27 mmol/L (ref 22–32)
Calcium: 8.3 mg/dL — ABNORMAL LOW (ref 8.9–10.3)
Chloride: 102 mmol/L (ref 98–111)
Creatinine, Ser: 0.86 mg/dL (ref 0.61–1.24)
GFR calc Af Amer: >60 mL/min (ref >60)
GFR calc non Af Amer: >60 mL/min (ref >60)
Glucose, Bld: 128 mg/dL — ABNORMAL HIGH (ref 70–99)
Potassium: 4.1 mmol/L (ref 3.5–5.1)
Sodium: 136 mmol/L (ref 135–145)

## 2018-04-30 MED ORDER — LISINOPRIL 10 MG PO TABS
10.0000 mg | ORAL_TABLET | Freq: Every day | ORAL | Status: DC
  Administered 2018-04-30 – 2018-05-01 (×2): 10 mg via ORAL
  Filled 2018-04-30 (×3): qty 1

## 2018-04-30 MED ORDER — METOPROLOL TARTRATE 50 MG PO TABS
50.0000 mg | ORAL_TABLET | Freq: Two times a day (BID) | ORAL | Status: DC
  Administered 2018-04-30 – 2018-05-03 (×7): 50 mg via ORAL
  Filled 2018-04-30 (×7): qty 1

## 2018-04-30 MED ORDER — FUROSEMIDE 10 MG/ML IJ SOLN
40.0000 mg | Freq: Two times a day (BID) | INTRAMUSCULAR | Status: AC
  Administered 2018-04-30 (×2): 40 mg via INTRAVENOUS
  Filled 2018-04-30 (×2): qty 4

## 2018-04-30 MED ORDER — GABAPENTIN 100 MG PO CAPS: 100.0000 mg | ORAL_CAPSULE | Freq: Three times a day (TID) | ORAL | Status: DC

## 2018-04-30 MED ORDER — SODIUM CHLORIDE 0.9% FLUSH: 3.0000 mL | INTRAVENOUS | Status: DC | PRN

## 2018-04-30 MED ORDER — TORSEMIDE 20 MG PO TABS
40.0000 mg | ORAL_TABLET | Freq: Two times a day (BID) | ORAL | Status: DC
  Administered 2018-05-01 (×2): 40 mg via ORAL
  Filled 2018-04-30 (×3): qty 2

## 2018-04-30 MED ORDER — AMIODARONE HCL 200 MG PO TABS
400.0000 mg | ORAL_TABLET | Freq: Two times a day (BID) | ORAL | Status: DC
  Administered 2018-04-30 – 2018-05-03 (×6): 400 mg via ORAL
  Filled 2018-04-30 (×7): qty 2

## 2018-04-30 MED ORDER — MOVING RIGHT ALONG BOOK
Freq: Once | Status: AC
  Administered 2018-04-30: 08:00:00
  Filled 2018-04-30: qty 1

## 2018-04-30 MED ORDER — SODIUM CHLORIDE 0.9% FLUSH
3.0000 mL | Freq: Two times a day (BID) | INTRAVENOUS | Status: DC
  Administered 2018-04-30 – 2018-05-02 (×3): 3 mL via INTRAVENOUS

## 2018-04-30 MED ORDER — POTASSIUM CHLORIDE CRYS ER 20 MEQ PO TBCR
40.0000 meq | EXTENDED_RELEASE_TABLET | Freq: Every day | ORAL | Status: DC
  Administered 2018-04-30 – 2018-05-02 (×3): 40 meq via ORAL
  Filled 2018-04-30 (×4): qty 2

## 2018-04-30 MED ORDER — SODIUM CHLORIDE 0.9 % IV SOLN: 250.0000 mL | INTRAVENOUS | Status: DC | PRN

## 2018-04-30 NOTE — Progress Notes (Addendum)
DAILY PROGRESS NOTE   Patient Name: [name] Date of Encounter: 04/30/2018  Chief Complaint   Admitted for chest pain, cath with severe CAD, now s/p CABG 04/27/2018  Patient Profile   S/p 3 V CABG (LIMA-dLAD, SVG-OM1, SVG-OM2) 04/27/18  Subjective   Had an episode of atrial fibrillation with RVR yesterday. Feeling well overall, no complaints. No chest pain or shortness of breath. No issues with bleeding. Tolerating PO and ambulation.  Objective   Vitals:   04/30/18 0900 04/30/18 0923 04/30/18 1030 04/30/18 1207  BP: (!) 149/90 (!) 145/79  (!) 122/59  Pulse: 84   82  Resp: 19 (!) 22 (!) 26 20  Temp:    98.6 F (37 C)  TempSrc:    Oral  SpO2: 98%   96%  Weight:      Height:        Intake/Output Summary (Last 24 hours) at 04/30/2018 1309 Last data filed at 04/30/2018 0950 Gross per 24 hour  Intake 850 ml  Output 500 ml  Net 350 ml   Filed Weights   04/28/18 0500 04/29/18 0500 04/30/18 0500  Weight: 293 lb 14 oz (133.3 kg) 291 lb 7.2 oz (132.2 kg) 291 lb 1.6 oz (132 kg)    Physical Exam   General appearance: alert, cooperative, appears stated age and no distress Neck: no JVD, supple, symmetrical, trachea midline and thyroid not enlarged, symmetric, no tenderness/mass/nodules Lungs: clear to auscultation bilaterally Heart: regular rate and rhythm, S1, S2 normal, no murmur, click, rub or gallop Abdomen: soft, non-tender; bowel sounds normal; no masses,  no organomegaly Extremities: right leg with bruising near site of vein harvest with mild edema Pulses: 2+ and symmetric PT and radial Skin: mobility and turgor normal or ecchymoses - upper leg(s) right Neurologic: Grossly normal  Inpatient Medications    Scheduled Meds:  acetaminophen  1,000 mg Oral Q6H   amiodarone  400 mg Oral BID WC   aspirin EC  325 mg Oral Daily   atorvastatin  80 mg Oral q1800   bisacodyl  10 mg Oral Daily   Or   bisacodyl  10 mg Rectal Daily   citalopram  20 mg Oral q morning  - 10a   docusate sodium  200 mg Oral Daily   furosemide  40 mg Intravenous BID   levothyroxine  112 mcg Oral QAC breakfast   lisinopril  10 mg Oral Daily   metoprolol tartrate  50 mg Oral BID   pantoprazole  40 mg Oral Daily   potassium chloride SA  40 mEq Oral Daily   sodium chloride flush  3 mL Intravenous Q12H   sodium chloride flush  3 mL Intravenous Q12H   tamsulosin  0.4 mg Oral Daily   [START ON 05/01/2018] torsemide  40 mg Oral BID    Continuous Infusions:  sodium chloride 250 mL (04/29/18 1803)   sodium chloride     lactated ringers 10 mL/hr at 04/28/18 0700   lactated ringers      PRN Meds: sodium chloride, metoprolol tartrate, ondansetron (ZOFRAN) IV, oxyCODONE, sodium chloride flush, sodium chloride flush, traMADol   Labs   Results for orders placed or performed during the hospital encounter of 04/24/18 (from the past 48 hour(s))  I-STAT, chem 8     Status: Abnormal   Collection Time: 04/28/18  4:54 PM  Result Value Ref Range   Sodium 138 135 - 145 mmol/L   Potassium 4.1 3.5 - 5.1 mmol/L   Chloride 102 98 -  111 mmol/L   BUN 7 (L) 8 - 23 mg/dL   Creatinine, Ser 0.70 0.61 - 1.24 mg/dL   Glucose, Bld 135 (H) 70 - 99 mg/dL   Calcium, Ion 1.14 (L) 1.15 - 1.40 mmol/L   TCO2 24 22 - 32 mmol/L   Hemoglobin 9.9 (L) 13.0 - 17.0 g/dL   HCT 29.0 (L) 39.0 - 52.0 %  Magnesium     Status: None   Collection Time: 04/28/18  5:00 PM  Result Value Ref Range   Magnesium 2.4 1.7 - 2.4 mg/dL    Comment: Performed at [facility], [place] [address], [place], [place] [address]  CBC     Status: Abnormal   Collection Time: 04/28/18  5:00 PM  Result Value Ref Range   WBC 11.3 (H) 4.0 - 10.5 K/uL   RBC 3.21 (L) 4.22 - 5.81 MIL/uL   Hemoglobin 9.8 (L) 13.0 - 17.0 g/dL   HCT 30.8 (L) 39.0 - 52.0 %   MCV 96.0 78.0 - 100.0 fL   MCH 30.5 26.0 - 34.0 pg   MCHC 31.8 30.0 - 36.0 g/dL   RDW 14.1 11.5 - 15.5 %   Platelets 108 (L) 150 - 400 K/uL    Comment: CONSISTENT  WITH PREVIOUS RESULT Performed at [facility], [place] [address] [place], [place] [address]   Creatinine, serum     Status: None   Collection Time: 04/28/18  5:00 PM  Result Value Ref Range   Creatinine, Ser 0.96 0.61 - 1.24 mg/dL   GFR calc non Af Amer >60 >60 mL/min   GFR calc Af Amer >60 >60 mL/min    Comment: (NOTE) The eGFR has been calculated using the CKD EPI equation. This calculation has not been validated in all clinical situations. eGFR's persistently <60 mL/min signify possible Chronic Kidney Disease. Performed at [facility], [place] [address], [place], [place] [address]   Basic metabolic panel     Status: Abnormal   Collection Time: 04/29/18  3:36 AM  Result Value Ref Range   Sodium 136 135 - 145 mmol/L   Potassium 4.0 3.5 - 5.1 mmol/L   Chloride 105 98 - 111 mmol/L    Comment: Please note change in reference range.   CO2 28 22 - 32 mmol/L   Glucose, Bld 141 (H) 70 - 99 mg/dL    Comment: Please note change in reference range.   BUN 9 8 - 23 mg/dL    Comment: Please note change in reference range.   Creatinine, Ser 0.89 0.61 - 1.24 mg/dL   Calcium 8.0 (L) 8.9 - 10.3 mg/dL   GFR calc non Af Amer >60 >60 mL/min   GFR calc Af Amer >60 >60 mL/min    Comment: (NOTE) The eGFR has been calculated using the CKD EPI equation. This calculation has not been validated in all clinical situations. eGFR's persistently <60 mL/min signify possible Chronic Kidney Disease.    Anion gap 3 (L) 5 - 15    Comment: Performed at [facility], [place] [address] [place], [place] [address]  CBC     Status: Abnormal   Collection Time: 04/29/18  3:36 AM  Result Value Ref Range   WBC 10.9 (H) 4.0 - 10.5 K/uL   RBC 2.95 (L) 4.22 - 5.81 MIL/uL   Hemoglobin 8.8 (L) 13.0 - 17.0 g/dL   HCT 28.3 (L) 39.0 - 52.0 %   MCV 95.9 78.0 - 100.0 fL   MCH  29.8 26.0 - 34.0 pg   MCHC 31.1 30.0 - 36.0 g/dL   RDW 13.9 11.5 - 15.5 %   Platelets 99 (L) 150 - 400 K/uL    Comment: CONSISTENT  WITH PREVIOUS RESULT Performed at [facility], [place] [address], [place], [place] [address]   Basic metabolic panel     Status: Abnormal   Collection Time: 04/30/18  2:24 AM  Result Value Ref Range   Sodium 136 135 - 145 mmol/L   Potassium 4.1 3.5 - 5.1 mmol/L   Chloride 102 98 - 111 mmol/L    Comment: Please note change in reference range.   CO2 27 22 - 32 mmol/L   Glucose, Bld 128 (H) 70 - 99 mg/dL    Comment: Please note change in reference range.   BUN 11 8 - 23 mg/dL    Comment: Please note change in reference range.   Creatinine, Ser 0.86 0.61 - 1.24 mg/dL   Calcium 8.3 (L) 8.9 - 10.3 mg/dL   GFR calc non Af Amer >60 >60 mL/min   GFR calc Af Amer >60 >60 mL/min    Comment: (NOTE) The eGFR has been calculated using the CKD EPI equation. This calculation has not been validated in all clinical situations. eGFR's persistently <60 mL/min signify possible Chronic Kidney Disease.    Anion gap 7 5 - 15    Comment: Performed at [place] [address], [place], [place] [address]  CBC     Status: Abnormal   Collection Time: 04/30/18  2:24 AM  Result Value Ref Range   WBC 10.8 (H) 4.0 - 10.5 K/uL   RBC 2.92 (L) 4.22 - 5.81 MIL/uL   Hemoglobin 8.7 (L) 13.0 - 17.0 g/dL   HCT 27.2 (L) 39.0 - 52.0 %   MCV 93.2 78.0 - 100.0 fL   MCH 29.8 26.0 - 34.0 pg   MCHC 32.0 30.0 - 36.0 g/dL   RDW 13.7 11.5 - 15.5 %   Platelets 106 (L) 150 - 400 K/uL    Comment: CONSISTENT WITH PREVIOUS RESULT Performed at [facility], [place] [address], [place], [place] [address]     ECG   Atrial fibrillation with RVR - Personally Reviewed  Telemetry   NSR with intermittent Atrial fibrillation with occasional PVCs, episode of afib with RVR noted- Personally Reviewed  Radiology    Dg Chest Port 1 View  Result Date: 04/30/2018 CLINICAL DATA:  Status post coronary artery bypass grafting. Cordis removal. EXAM: PORTABLE CHEST 1 VIEW COMPARISON:  April 29, 2018 FINDINGS: Cordis has  been removed. The previously noted small apical pneumothorax on the left appears slightly smaller compared to 1 day prior. No tension component. No evident pneumothorax on the right. There is mild bibasilar atelectasis. There is no edema or consolidation. Heart remains prominent with pulmonary vascularity normal, stable. Patient is status post coronary artery bypass grafting. No evident adenopathy. Postoperative change noted in the lower cervical spine. IMPRESSION: Small left apical pneumothorax, slightly smaller compared to 1 day previously. No tension component. Mild bibasilar atelectasis. No frank edema or consolidation. Stable cardiac silhouette. Electronically Signed   By: Lowella Grip III M.D.   On: 04/30/2018 07:25   Dg Chest Port 1 View  Result Date: 04/29/2018 CLINICAL DATA:  Postoperative evaluation EXAM: PORTABLE CHEST 1 VIEW COMPARISON:  Chest radiograph 04/28/2018 FINDINGS: Monitoring leads overlie the patient. Right IJ sheath projects over the superior vena cava. Interval removal left chest tube. Interval retraction  PA catheter. Stable cardiomegaly status post median sternotomy. Persistent left basilar atelectasis. Similar-appearing small left apical pneumothorax. IMPRESSION: Persistent small left apical pneumothorax. Interval removal left chest tube. Minimal left basilar atelectasis. Electronically Signed   By: Lovey Newcomer M.D.   On: 04/29/2018 08:21    Cardiac Studies  Prior cath reviewed from 04/24/18 Prior echo reviewed from 04/25/18   Assessment   1. Principal Problem: 2.   S/P CABG x 3 3. Active Problems: 4.   Chronic back pain 5.   Essential hypertension 6.   Coronary artery disease involving native coronary artery of native heart with angina pectoris ([facility]) 7.   Mixed hyperlipidemia 8.   Dyspnea 9.   Hyperlipidemia 10.   Chronic ITP (idiopathic thrombocytopenia) ([facility]) 11.   Angina pectoris ([facility]) 12.   Obesity 13.   Chronic combined systolic and diastolic congestive  heart failure ([facility]) 14.   Plan   1. CAD s/p 3 V CABG. -Doing well postoperatively. Ambulating well. Recommend change to 81 mg aspirin daily. -continue metoprolol and lisinopril -should follow up with Dr. Johnsie Cancel post discharge -phase 1 cardiac rehab ordered  2. Atrial fibrillation, postoperative -brief episode on 12 lead ECG, difficult to assess on telemetry due to baseline artifact and intermittent sinus rhythm with PACs. Surgical team started amiodarone for rhythm control, already on metoprolol for rate control. CHADSVASC elevated and would theoretically benefit from anticoagulation, but can monitor for now. If he has repeated or sustained episodes of afib this will have to be re-evaluated. -discussed afib triggers, mechanism, risks, and treatment -if afib persists, consider sleep study as outpatient  Time Spent Directly with Patient: I have spent a total of >35 minutes with the patient reviewing hospital notes, telemetry, EKGs, labs and examining the patient as well as establishing an assessment and plan that was discussed personally with the patient.  > 50% of time was spent in direct patient care.  Length of Stay:  LOS: 6 days   Buford Dresser, MD, PhD [facility]   Bridgette A Christopher 04/30/2018, 1:09 PM  Addendum: please note that history/plan updated after signing.

## 2018-04-30 NOTE — Progress Notes (Signed)
0152 Pt converted to NSR

## 2018-04-30 NOTE — Progress Notes (Signed)
[facility] BRIEF SICU PROGRESS NOTE  3 Days Post-Op  S/P Procedure(s) (LRB): CORONARY ARTERY BYPASS GRAFTING (CABG) x Three , using left internal mammary artery and right leg greater saphenous vein (N/A) TRANSESOPHAGEAL ECHOCARDIOGRAM (TEE) (N/A)   Stable day Maintaining NSR w/ stable BP  Plan: Awaiting bed on 4E for transfer  [name], MD 04/30/2018 6:55 PM

## 2018-04-30 NOTE — Care Management Note (Signed)
Case Management Note Marvetta Gibbons RN,BSN Unit [facility] 1-22 Case Manager  [phone]  Patient Details  Name: KEITHEN CAPO MRN: [id number] Date of Birth: [birth year]  Subjective/Objective:   Pt admitted with chest pain, cath reveled sever 3VD, pt s/p CABGx3 on 6/28                  Action/Plan: PTA pt lived at home with wife, anticipate return home, CM to follow for transition of care needs  Expected Discharge Date:                  Expected Discharge Plan:  Home/Self Care(Completely independent, family will provide 24 hour supervison)  In-House Referral:     Discharge planning Services  CM Consult  Post Acute Care Choice:    Choice offered to:     DME Arranged:    DME Agency:     HH Arranged:    [place] Agency:     Status of Service:  In process, will continue to follow  If discussed at Long Length of Stay Meetings, dates discussed:    Discharge Disposition:    Additional Comments:  Dawayne Patricia, RN 04/30/2018, 10:47 AM

## 2018-04-30 NOTE — Progress Notes (Signed)
[address]       [place] [address]             [phone]        CARDIOTHORACIC SURGERY PROGRESS NOTE   R3 Days Post-Op Procedure(s) (LRB): CORONARY ARTERY BYPASS GRAFTING (CABG) x Three , using left internal mammary artery and right leg greater saphenous vein (N/A) TRANSESOPHAGEAL ECHOCARDIOGRAM (TEE) (N/A)  Subjective: Looks good.  No complaints.  Episode Afib yesterday, back in NSR overnight.  Objective: Vital signs: BP Readings from Last 1 Encounters:  04/30/18 (!) 159/62   Pulse Readings from Last 1 Encounters:  04/30/18 97   Resp Readings from Last 1 Encounters:  04/30/18 (!) 25   Temp Readings from Last 1 Encounters:  04/30/18 98.8 F (37.1 C) (Oral)    Hemodynamics:    Physical Exam:  Rhythm:   sinus  Breath sounds: clear  Heart sounds:  RRR  Incisions:  Dressing dry, intact  Abdomen:  Soft, non-distended, non-tender  Extremities:  Warm, well-perfused    Intake/Output from previous day: 06/30 0701 - 07/01 0700 In: 850 [P.O.:750; I.V.:100] Out: 1225 [Urine:1225] Intake/Output this shift: No intake/output data recorded.  Lab Results:  CBC: Recent Labs    04/29/18 0336 04/30/18 0224  WBC 10.9* 10.8*  HGB 8.8* 8.7*  HCT 28.3* 27.2*  PLT 99* 106*    BMET:  Recent Labs    04/29/18 0336 04/30/18 0224  NA 136 136  K 4.0 4.1  CL 105 102  CO2 28 27  GLUCOSE 141* 128*  BUN 9 11  CREATININE 0.89 0.86  CALCIUM 8.0* 8.3*     PT/INR:   Recent Labs    04/27/18 1346  LABPROT 15.7*  INR 1.27    CBG (last 3)  Recent Labs    04/27/18 2339 04/28/18 0328 04/28/18 0747  GLUCAP 107* 104* 108*    ABG    Component Value Date/Time   PHART 7.332 (L) 04/28/2018 0349   PCO2ART 45.7 04/28/2018 0349   PO2ART 117.0 (H) 04/28/2018 0349   HCO3 24.0 04/28/2018 0349   TCO2 24 04/28/2018 1654   ACIDBASEDEF 2.0 04/28/2018 0349   O2SAT 98.0 04/28/2018 0349    CXR: PORTABLE CHEST 1 VIEW  COMPARISON:  April 29, 2018  FINDINGS: Cordis has been removed. The previously noted small apical pneumothorax on the left appears slightly smaller compared to 1 day prior. No tension component. No evident pneumothorax on the right.  There is mild bibasilar atelectasis. There is no edema or consolidation. Heart remains prominent with pulmonary vascularity normal, stable. Patient is status post coronary artery bypass grafting. No evident adenopathy. Postoperative change noted in the lower cervical spine.  IMPRESSION: Small left apical pneumothorax, slightly smaller compared to 1 day previously. No tension component. Mild bibasilar atelectasis. No frank edema or consolidation. Stable cardiac silhouette.   Electronically Signed   By: Lowella Grip III M.D.   On: 04/30/2018 07:25   Assessment/Plan: S/P Procedure(s) (LRB): CORONARY ARTERY BYPASS GRAFTING (CABG) x Three , using left internal mammary artery and right leg greater saphenous vein (N/A) TRANSESOPHAGEAL ECHOCARDIOGRAM (TEE) (N/A)  Doing well POD3 Episode post op Afib yesterday, currently maintaining NSR on oral amiodarone Hypertension, BP trending up last 12 hours Expected post op acute blood loss anemia, mild, stable Expected post op atelectasis, very mild Chronic combined systolic and diastolic CHF with expected post-op volume excess, weight reportedly 19 lbs > preop   Mobilize  Diuresis  Continue ASA,  beta blocker, statin  Add ACE-I  Continue amiodarone and consider warfarin if Afib recurs  Transfer 4E   [name], MD 04/30/2018 7:58 AM

## 2018-04-30 NOTE — Progress Notes (Signed)
0115 Patient's O2 monitor alarming. RN to room, pt sleeping, O2 probe pulled off and on floor, BP cuff off, IV out of arm. Pt stated he doesn't remember pulling everything off. New IV inserted and monitors hooked back up. Pt repositioned and resting comfortably. WCTM

## 2018-05-01 DIAGNOSIS — E7849 Other hyperlipidemia: Secondary | ICD-10-CM

## 2018-05-01 LAB — CBC
HEMATOCRIT: 26.7 % — AB (ref 39.0–52.0)
Hemoglobin: 8.5 g/dL — ABNORMAL LOW (ref 13.0–17.0)
MCH: 30.1 pg (ref 26.0–34.0)
MCHC: 31.8 g/dL (ref 30.0–36.0)
MCV: 94.7 fL (ref 78.0–100.0)
Platelets: 141 10*3/uL — ABNORMAL LOW (ref 150–400)
RBC: 2.82 MIL/uL — ABNORMAL LOW (ref 4.22–5.81)
RDW: 14.2 % (ref 11.5–15.5)
WBC: 10 10*3/uL (ref 4.0–10.5)

## 2018-05-01 LAB — BASIC METABOLIC PANEL
Anion gap: 7 (ref 5–15)
BUN: 14 mg/dL (ref 8–23)
CALCIUM: 8.2 mg/dL — AB (ref 8.9–10.3)
CO2: 29 mmol/L (ref 22–32)
CREATININE: 1.01 mg/dL (ref 0.61–1.24)
Chloride: 101 mmol/L (ref 98–111)
GFR calc Af Amer: >60 mL/min (ref >60)
GFR calc non Af Amer: >60 mL/min (ref >60)
GLUCOSE: 118 mg/dL — AB (ref 70–99)
Potassium: 4 mmol/L (ref 3.5–5.1)
Sodium: 137 mmol/L (ref 135–145)

## 2018-05-01 LAB — TSH: TSH: 9.09 u[IU]/mL — AB (ref 0.350–4.500)

## 2018-05-01 LAB — GLUCOSE, CAPILLARY: GLUCOSE-CAPILLARY: 104 mg/dL — AB (ref 70–99)

## 2018-05-01 MED ORDER — ASPIRIN EC 81 MG PO TBEC
81.0000 mg | DELAYED_RELEASE_TABLET | Freq: Every day | ORAL | Status: DC
  Administered 2018-05-02 – 2018-05-03 (×2): 81 mg via ORAL
  Filled 2018-05-01 (×2): qty 1

## 2018-05-01 MED FILL — Sodium Bicarbonate IV Soln 8.4%: INTRAVENOUS | Qty: 50 | Status: AC

## 2018-05-01 MED FILL — Potassium Chloride Inj 2 mEq/ML: INTRAVENOUS | Qty: 40 | Status: AC

## 2018-05-01 MED FILL — Magnesium Sulfate Inj 50%: INTRAMUSCULAR | Qty: 10 | Status: AC

## 2018-05-01 MED FILL — Heparin Sodium (Porcine) Inj 1000 Unit/ML: INTRAMUSCULAR | Qty: 10 | Status: AC

## 2018-05-01 MED FILL — Lidocaine HCl(Cardiac) IV PF Soln Pref Syr 100 MG/5ML (2%): INTRAVENOUS | Qty: 5 | Status: AC

## 2018-05-01 MED FILL — Heparin Sodium (Porcine) Inj 1000 Unit/ML: INTRAMUSCULAR | Qty: 30 | Status: AC

## 2018-05-01 MED FILL — Mannitol IV Soln 20%: INTRAVENOUS | Qty: 500 | Status: AC

## 2018-05-01 MED FILL — Electrolyte-R (PH 7.4) Solution: INTRAVENOUS | Qty: 4000 | Status: AC

## 2018-05-01 MED FILL — Sodium Chloride IV Soln 0.9%: INTRAVENOUS | Qty: 2000 | Status: AC

## 2018-05-01 NOTE — Progress Notes (Signed)
Pt arrived from [facility] via wheelchair. Pt in good spirits. Pacer wires removed. Vitals stable. CHG and vitals obtained. Jerald Kief, RN

## 2018-05-01 NOTE — Progress Notes (Signed)
Progress Note  Patient Name: [name] Date of Encounter: 05/01/2018  Primary Cardiologist: Jenkins Rouge, MD   Subjective   Doing well. No concerns today. Ambulating well. He was glad to hear that his platelets were improving.   Inpatient Medications    Scheduled Meds: . acetaminophen  1,000 mg Oral Q6H  . amiodarone  400 mg Oral BID WC  . aspirin EC  325 mg Oral Daily  . atorvastatin  80 mg Oral q1800  . bisacodyl  10 mg Oral Daily   Or  . bisacodyl  10 mg Rectal Daily  . citalopram  20 mg Oral q morning - 10a  . docusate sodium  200 mg Oral Daily  . levothyroxine  112 mcg Oral QAC breakfast  . lisinopril  10 mg Oral Daily  . metoprolol tartrate  50 mg Oral BID  . pantoprazole  40 mg Oral Daily  . potassium chloride SA  40 mEq Oral Daily  . sodium chloride flush  3 mL Intravenous Q12H  . sodium chloride flush  3 mL Intravenous Q12H  . tamsulosin  0.4 mg Oral Daily  . torsemide  40 mg Oral BID   Continuous Infusions: . sodium chloride 250 mL (04/29/18 1803)  . sodium chloride    . lactated ringers 10 mL/hr at 04/28/18 0700  . lactated ringers     PRN Meds: sodium chloride, metoprolol tartrate, ondansetron (ZOFRAN) IV, oxyCODONE, sodium chloride flush, sodium chloride flush, traMADol   Vital Signs    Vitals:   05/01/18 1030 05/01/18 1049 05/01/18 1100 05/01/18 1217  BP: 108/79 123/75 (!) 103/55 110/86  Pulse:    72  Resp: 19 19  17   Temp:   98.3 F (36.8 C)   TempSrc:   Oral   SpO2:   95% 94%  Weight:      Height:        Intake/Output Summary (Last 24 hours) at 05/01/2018 1414 Last data filed at 05/01/2018 1000 Gross per 24 hour  Intake 750 ml  Output 400 ml  Net 350 ml   Filed Weights   04/29/18 0500 04/30/18 0500 05/01/18 0504  Weight: 291 lb 7.2 oz (132.2 kg) 291 lb 1.6 oz (132 kg) 290 lb 9.6 oz (131.8 kg)    Telemetry    Sinus rhythm with occasional PVCs - Personally Reviewed  ECG    NSR - Personally Reviewed  Physical Exam   GEN: No  acute distress.   Neck: supple, no JVD Cardiac: regular S1 and S2, no murmurs, rubs, or gallops. Sternal wound c/d/i Respiratory: Clear to auscultation bilaterally. GI: Soft, nontender, non-distended. Bowel sounds normal MS: no edema; No deformity. Neuro:  Nonfocal, moves all limbs independently Psych: Normal affect   Labs    Chemistry Recent Labs  Lab 04/25/18 0358  04/29/18 0336 04/30/18 0224 05/01/18 0240  NA 141   < > 136 136 137  K 3.5   < > 4.0 4.1 4.0  CL 103   < > 105 102 101  CO2 28   < > 28 27 29   GLUCOSE 90   < > 141* 128* 118*  BUN 14   < > 9 11 14   CREATININE 0.98   < > 0.89 0.86 1.01  CALCIUM 8.5*   < > 8.0* 8.3* 8.2*  PROT 5.7*  --   --   --   --   ALBUMIN 3.4*  --   --   --   --   AST  13*  --   --   --   --   ALT 12  --   --   --   --   ALKPHOS 75  --   --   --   --   BILITOT 1.1  --   --   --   --   GFRNONAA >60   < > >60 >60 >60  GFRAA >60   < > >60 >60 >60  ANIONGAP 10   < > 3* 7 7   < > = values in this interval not displayed.     Hematology Recent Labs  Lab 04/29/18 0336 04/30/18 0224 05/01/18 0240  WBC 10.9* 10.8* 10.0  RBC 2.95* 2.92* 2.82*  HGB 8.8* 8.7* 8.5*  HCT 28.3* 27.2* 26.7*  MCV 95.9 93.2 94.7  MCH 29.8 29.8 30.1  MCHC 31.1 32.0 31.8  RDW 13.9 13.7 14.2  PLT 99* 106* 141*    Cardiac Enzymes Recent Labs  Lab 04/25/18 0358  TROPONINI <0.03   No results for input(s): TROPIPOC in the last 168 hours.   BNP Recent Labs  Lab 04/25/18 0358  BNP 152.8*     DDimer No results for input(s): DDIMER in the last 168 hours.   Radiology    Dg Chest Port 1 View  Result Date: 04/30/2018 CLINICAL DATA:  Status post coronary artery bypass grafting. Cordis removal. EXAM: PORTABLE CHEST 1 VIEW COMPARISON:  April 29, 2018 FINDINGS: Cordis has been removed. The previously noted small apical pneumothorax on the left appears slightly smaller compared to 1 day prior. No tension component. No evident pneumothorax on the right. There is mild  bibasilar atelectasis. There is no edema or consolidation. Heart remains prominent with pulmonary vascularity normal, stable. Patient is status post coronary artery bypass grafting. No evident adenopathy. Postoperative change noted in the lower cervical spine. IMPRESSION: Small left apical pneumothorax, slightly smaller compared to 1 day previously. No tension component. Mild bibasilar atelectasis. No frank edema or consolidation. Stable cardiac silhouette. Electronically Signed   By: Lowella Grip III M.D.   On: 04/30/2018 07:25    Cardiac Studies   Echo, cath previously reviewed  Patient Profile     67 y.o. male S/p 3 V CABG (LIMA-dLAD, SVG-OM1, SVG-OM2) 04/27/18  Assessment & Plan    1. CAD s/p 3 V CABG. -Doing well postoperatively. Ambulating well. Changed to 81 mg aspirin daily given his history of ITP. Platelet levels rising postop. -continue metoprolol and lisinopril -follow up with Dr. Johnsie Cancel post discharge -phase 1 cardiac rehab ordered  2. Atrial fibrillation, postoperative -brief episode, has remained in sinus since initiation of amiodarone. If he remains in NSR, would avoid anticoagulation despite his CHADSVASC given his ITP and postoperative blood loss anemia.  -discussed afib triggers, mechanism, risks, and treatment -if afib persists, consider sleep study as outpatient   Time Spent Directly with Patient: I have spent a total of 25 minutes with the patient reviewing hospital notes, telemetry, EKGs, labs and examining the patient as well as establishing an assessment and plan that was discussed personally with the patient.  > 50% of time was spent in direct patient care.  Length of Stay:  LOS: 7 days   Buford Dresser, MD, PhD [facility]   05/01/2018, 2:14 PM  [facility] will sign off.    CARDIOLOGY RECOMMENDATIONS:  Discharge is anticipated in the next 48 hours. Recommendations for medications and follow up:  Discharge  Medications: Continue medications as they are  currently listed in the [facility]. Exceptions to the above:  Aspirin changed today to 81 mg, recommend this dose given his ITP  Follow Up: The patient's Primary Cardiologist is Jenkins Rouge, MD  Follow up in the office in 2 week(s).  Signed,  Buford Dresser, MD  2:15 PM 05/01/2018  [facility] For questions or updates, please contact [facility] Please consult [email or web address] for contact info under Cardiology/STEMI.

## 2018-05-01 NOTE — Progress Notes (Addendum)
[address]       [place] [address]             [phone]      4 Days Post-Op Procedure(s) (LRB): CORONARY ARTERY BYPASS GRAFTING (CABG) x Three , using left internal mammary artery and right leg greater saphenous vein (N/A) TRANSESOPHAGEAL ECHOCARDIOGRAM (TEE) (N/A) Subjective: Looks and feels pretty well, pain well controlled, + BM's , breathing is comfortable  Objective: Vital signs in last 24 hours: Temp:  [97.9 F (36.6 C)-98.6 F (37 C)] 97.9 F (36.6 C) (07/02 0400) Pulse Rate:  [69-82] 69 (07/02 0402) Cardiac Rhythm: Normal sinus rhythm (07/02 0402) Resp:  [18-26] 21 (07/02 0402) BP: (106-130)/(51-67) 119/67 (07/02 0402) SpO2:  [94 %-96 %] 94 % (07/02 0402) Weight:  [131.8 kg (290 lb 9.6 oz)] 131.8 kg (290 lb 9.6 oz) (07/02 0504)  Hemodynamic parameters for last 24 hours:    Intake/Output from previous day: 07/01 0701 - 07/02 0700 In: 1000 [P.O.:1000] Out: 1100 [Urine:1100] Intake/Output this shift: No intake/output data recorded.  General appearance: alert, cooperative and no distress Heart: regular rate and rhythm and some extrasystoles Lungs: min dim in bases Abdomen: benign, obese Extremities: minor LE edema Wound: incis healing well without signs of infection  Lab Results: Recent Labs    04/30/18 0224 05/01/18 0240  WBC 10.8* 10.0  HGB 8.7* 8.5*  HCT 27.2* 26.7*  PLT 106* 141*   BMET:  Recent Labs    04/30/18 0224 05/01/18 0240  NA 136 137  K 4.1 4.0  CL 102 101  CO2 27 29  GLUCOSE 128* 118*  BUN 11 14  CREATININE 0.86 1.01  CALCIUM 8.3* 8.2*    PT/INR: No results for input(s): LABPROT, INR in the last 72 hours. ABG    Component Value Date/Time   PHART 7.332 (L) 04/28/2018 0349   HCO3 24.0 04/28/2018 0349   TCO2 24 04/28/2018 1654   ACIDBASEDEF 2.0 04/28/2018 0349   O2SAT 98.0 04/28/2018 0349   CBG (last 3)  No results for input(s): GLUCAP in the last 72 hours.  Meds Scheduled Meds: .  acetaminophen  1,000 mg Oral Q6H  . amiodarone  400 mg Oral BID WC  . aspirin EC  325 mg Oral Daily  . atorvastatin  80 mg Oral q1800  . bisacodyl  10 mg Oral Daily   Or  . bisacodyl  10 mg Rectal Daily  . citalopram  20 mg Oral q morning - 10a  . docusate sodium  200 mg Oral Daily  . levothyroxine  112 mcg Oral QAC breakfast  . lisinopril  10 mg Oral Daily  . metoprolol tartrate  50 mg Oral BID  . pantoprazole  40 mg Oral Daily  . potassium chloride SA  40 mEq Oral Daily  . sodium chloride flush  3 mL Intravenous Q12H  . sodium chloride flush  3 mL Intravenous Q12H  . tamsulosin  0.4 mg Oral Daily  . torsemide  40 mg Oral BID   Continuous Infusions: . sodium chloride 250 mL (04/29/18 1803)  . sodium chloride    . lactated ringers 10 mL/hr at 04/28/18 0700  . lactated ringers     PRN Meds:.sodium chloride, metoprolol tartrate, ondansetron (ZOFRAN) IV, oxyCODONE, sodium chloride flush, sodium chloride flush, traMADol  Xrays Dg Chest Port 1 View  Result Date: 04/30/2018 CLINICAL DATA:  Status post coronary artery bypass grafting. Cordis removal. EXAM: PORTABLE CHEST 1 VIEW COMPARISON:  April 29, 2018 FINDINGS: Cordis has been removed. The previously noted small apical pneumothorax on the left appears slightly smaller compared to 1 day prior. No tension component. No evident pneumothorax on the right. There is mild bibasilar atelectasis. There is no edema or consolidation. Heart remains prominent with pulmonary vascularity normal, stable. Patient is status post coronary artery bypass grafting. No evident adenopathy. Postoperative change noted in the lower cervical spine. IMPRESSION: Small left apical pneumothorax, slightly smaller compared to 1 day previously. No tension component. Mild bibasilar atelectasis. No frank edema or consolidation. Stable cardiac silhouette. Electronically Signed   By: Lowella Grip III M.D.   On: 04/30/2018 07:25    Assessment/Plan: S/P Procedure(s)  (LRB): CORONARY ARTERY BYPASS GRAFTING (CABG) x Three , using left internal mammary artery and right leg greater saphenous vein (N/A) TRANSESOPHAGEAL ECHOCARDIOGRAM (TEE) (N/A)  1 conts with excellent progress, awaits 4E bed 2 hemodyn stable in sinus with some PVC's , EF in 45-50 % range, K+is ok,  as is recent Mg++ level range, cont current Beta blocker dosing. He has not had further Afib recently- cont to monitor on po amio- epw's to be removed today. BP control is good- cont current lisinopril dose 3 ABL anemia is pretty stable and platelet count is improved 4 volume overload is improving on demadex, , trend creat- currently in normal range as is BUN. He is not alkalotic, cont to monitor CO2 5 BS control is good, A1C is 5.6, on no meds- will need aggressive lifestyle-nutrition  management with metabolic syndrome/obesity. His lipid profile indicates poor HDL/TG ratio which is a good indicator of insulin resistance. LDL is good- cont statin 7 he is on amio and thyroid supplement- will check TSH with none in epic since 2017 9 poss home 1-2 days  LOS: 7 days    [name] 05/01/2018 Pager [phone]  I have seen and examined the patient and agree with the assessment and plan as outlined.  Tentatively plan d/c home 1-2 days  [name], MD 05/01/2018 10:02 AM

## 2018-05-02 MED ORDER — LISINOPRIL 10 MG PO TABS
10.0000 mg | ORAL_TABLET | Freq: Every day | ORAL | Status: DC
  Administered 2018-05-03: 10 mg via ORAL
  Filled 2018-05-02: qty 1

## 2018-05-02 MED ORDER — LISINOPRIL 10 MG PO TABS
20.0000 mg | ORAL_TABLET | Freq: Every day | ORAL | Status: DC
Start: 2018-05-02 — End: 2018-05-02
  Administered 2018-05-02: 10 mg via ORAL

## 2018-05-02 MED ORDER — TORSEMIDE 20 MG PO TABS
40.0000 mg | ORAL_TABLET | Freq: Two times a day (BID) | ORAL | Status: DC
  Administered 2018-05-02 – 2018-05-03 (×3): 40 mg via ORAL
  Filled 2018-05-02 (×2): qty 2

## 2018-05-02 NOTE — Discharge Summary (Signed)
Physician Discharge Summary  Patient ID: [name] MRN: [id number] DOB/AGE: 02-Jul-1951 67 y.o.  Admit date: 04/24/2018 Discharge date: 05/03/2018  Admission Diagnoses: Patient Active Problem List   Diagnosis Date Noted  . Angina pectoris ([facility]) 04/24/2018  . Obesity   . Chronic combined systolic and diastolic congestive heart failure ([facility])   . Peripheral edema 06/30/2017  . Pedal edema 11/28/2016  . Osteopenia 08/13/2015  . Thrombocytopenia ([facility]) 05/25/2015  . Chronic ITP (idiopathic thrombocytopenia) ([facility]) 05/25/2015  . Hypothyroidism 08/29/2014  . Diverticulitis of colon (without mention of hemorrhage)(562.11) 02/04/2014  . Dysphagia, unspecified(787.20) 02/04/2014  . Hyperlipidemia 11/18/2013  . Dyspnea 01/02/2012  . Palpitations 01/02/2012  . Mixed hyperlipidemia 07/06/2011  . Coronary artery disease involving native coronary artery of native heart with angina pectoris ([facility])   . TOTAL KNEE FOLLOW-UP 08/14/2008  . Chronic back pain 07/23/2008  . KNEE, ARTHRITIS, DEGEN./OSTEO 05/29/2008  . JOINT EFFUSION, KNEE 04/30/2008  . Pain in joint, lower leg 10/10/2007  . Essential hypertension 10/09/2007    Discharge Diagnoses:  Principal Problem:   S/P CABG x 3 Active Problems:   Chronic back pain   Essential hypertension   Coronary artery disease involving native coronary artery of native heart with angina pectoris ([facility])   Mixed hyperlipidemia   Dyspnea   Hyperlipidemia   Chronic ITP (idiopathic thrombocytopenia) ([facility])   Angina pectoris ([facility])   Obesity   Chronic combined systolic and diastolic congestive heart failure ([facility])   Discharged Condition: good\  HPI:   This is a 67 year old male patient with a past medical history of CAD with stents in the circumflex, RCA, and LAD. The last stent placement was in 2012. His initial stents were placed in 2003. He also have severe arthritis in his left knee, chronic cervical and lumbar back pain, essential hypertension,  hyperlipidemia, hypothyroidism, and thrombocytopenia. He was seen in the ED on 04/17/2018 for chest pain and dyspnea. The patient was lifting a heavy grill and developed chest pain which started as a dull ache and radiated tot he left arm. The pain was thought to be musculoskeletal and he was discharged home with flexeril. He reported to the Cardiologist office on 04/19/2018 and was seen by Dr. Johnsie Cancel. He was concerned that the pain was more exertional and described it as chest tightness. He also had run out of his sublingual nitroglycerin. Cardiac cath was performed which showed severe three vessel disease. We were consulted for possible surgical intervention. He is currently chest pain free.    Hospital Course:  [name] underwent a coronary bypass grafting x3 with Dr. Roxy Manns on 04/27/2017.  He tolerated the procedure well and was transferred to the surgical ICU.  He was extubated in a timely manner.  Postop day 1 we discontinued his chest tubes and lines.  We initiated a diuretic regimen for fluid overload.  We began to mobilize the patient.  Postop day 2 we discontinued his central line and Foley catheter.  His renal function remained stable.  He was hemodynamically stable and a normal sinus rhythm.  Postop day 3 we added an ACE inhibitor for better blood pressure control.  He did have some postoperative atrial fibrillation and we initiated amiodarone.  He did have some expected postoperative atelectasis and we encourage incentive spirometry use.  We continued a diuretic regimen due to his fluid overload.  He was stable to transfer to the telemetry floor for continued care.  He converted to normal sinus rhythm but was still having frequent  PVCs.  He was on beta-blockade and amiodarone for rhythm control.  He had an elevated TSH on admission and was educated on following up with his primary care provider to adjust his Synthroid.  His blood glucose level was well controlled but with a hemoglobin A1c of 5.6 he  will need aggressive lifestyle change and nutrition management.  Today, he was ambulating with limited assistance, his incisions were healing well, he was tolerating room air with excellent oxygen saturation, and he was ready for discharge home.  Consults: cardiology  Significant Diagnostic Studies:   CLINICAL DATA:  Status post coronary artery bypass grafting. Cordis removal.  EXAM: PORTABLE CHEST 1 VIEW  COMPARISON:  April 29, 2018  FINDINGS: Cordis has been removed. The previously noted small apical pneumothorax on the left appears slightly smaller compared to 1 day prior. No tension component. No evident pneumothorax on the right.  There is mild bibasilar atelectasis. There is no edema or consolidation. Heart remains prominent with pulmonary vascularity normal, stable. Patient is status post coronary artery bypass grafting. No evident adenopathy. Postoperative change noted in the lower cervical spine.  IMPRESSION: Small left apical pneumothorax, slightly smaller compared to 1 day previously. No tension component. Mild bibasilar atelectasis. No frank edema or consolidation. Stable cardiac silhouette.   Electronically Signed   By: Lowella Grip III M.D.   On: 04/30/2018 07:25   Treatments: Date of Procedure:    04/27/2018  Preoperative Diagnosis:      Severe 3-vessel Coronary Artery Disease  Postoperative Diagnosis:    Same  Procedure:        Coronary Artery Bypass Grafting x 3             Left Internal Mammary Artery to Distal Left Anterior Descending Coronary Artery             Saphenous Vein Graft to First Obtuse Marginal Branch of Left Circumflex Coronary Artery             Saphenous Vein Graft to Second Obtuse Marginal Branch of Left Circumflex Coronary Artery             Endoscopic Vein Harvest from Right Thigh and Lower Leg  Surgeon:        Valentina Gu. Roxy Manns, MD  Assistant:       Nani Skillern, PA-C  Anesthesia:    Nolon Nations, MD  Operative Findings: ? Mild-moderate left ventricular systolic dysfunction ? Good quality left internal mammary artery conduit ? Good quality saphenous vein conduit ? Fair quality target vessels for grafting ? Terminal branches of right coronary artery too diseased for grafting     Discharge Exam: Blood pressure 138/65, pulse 68, temperature 98.8 F (37.1 C), temperature source Oral, resp. rate 14, height 5\' 11"  (1.803 m), weight 127.4 kg (280 lb 14.4 oz), SpO2 96 %.    General appearance: alert, cooperative and no distress Heart: regular rate and rhythm, S1, S2 normal, no murmur, click, rub or gallop Lungs: clear to auscultation bilaterally Abdomen: soft, non-tender; bowel sounds normal; no masses,  no organomegaly Extremities: 1-2+ non-pitting pedal edema Wound: clean and dry    Disposition: Discharge disposition: 01-Home or Self Care       Discharge Instructions    Amb Referral to Cardiac Rehabilitation   Complete by:  As directed    Diagnosis:  CABG   CABG X ___:  3   Discharge patient   Complete by:  As directed    Discharge disposition:  01-Home or  Self Care   Discharge patient date:  05/03/2018     Allergies as of 05/03/2018      Reactions   Penicillins Other (See Comments)   SYNCOPAL EPISODE Has patient had a PCN reaction causing immediate rash, facial/tongue/throat swelling, SOB or LIGHTHEADEDNESS with HYPOTENSION [SYNCOPE] #  #  #  YES  #  #  #  Has patient had a PCN reaction causing severe rash involving mucus membranes or skin necrosis:No Has patient had a PCN reaction that required hospitalization:No Has patient had a PCN reaction occurring within the last 10 years:Yes   Sulfa Antibiotics Swelling   SWELLING REACTION UNSPECIFIED > PER PREVIOUS PMH   Zoloft [sertraline Hcl] Other (See Comments)   Confusion      Medication List    STOP taking these medications   citalopram 20 MG tablet Commonly known as:  CELEXA   cyclobenzaprine 5  MG tablet Commonly known as:  FLEXERIL   nitroGLYCERIN 0.4 MG SL tablet Commonly known as:  NITROSTAT   pantoprazole 40 MG tablet Commonly known as:  PROTONIX   tamsulosin 0.4 MG Caps capsule Commonly known as:  FLOMAX     TAKE these medications   albuterol 108 (90 Base) MCG/ACT inhaler Commonly known as:  PROVENTIL HFA;VENTOLIN HFA Inhale 2 puffs into the lungs every 4 (four) hours as needed for wheezing or shortness of breath.   amiodarone 200 MG tablet Commonly known as:  PACERONE Take 1 tablet (200 mg total) by mouth 2 (two) times daily.   aspirin 81 MG EC tablet Take 1 tablet (81 mg total) by mouth daily.   atorvastatin 80 MG tablet Commonly known as:  LIPITOR Take 1 tablet (80 mg total) by mouth daily.   cetirizine 10 MG tablet Commonly known as:  ZYRTEC Take 10 mg by mouth at bedtime.   levothyroxine 150 MCG tablet Commonly known as:  SYNTHROID, LEVOTHROID Take 1 tablet (150 mcg total) by mouth daily before breakfast. Start taking on:  05/04/2018 What changed:    medication strength  See the new instructions.   lisinopril 10 MG tablet Commonly known as:  PRINIVIL,ZESTRIL Take 1 tablet (10 mg total) by mouth daily.   metoprolol tartrate 50 MG tablet Commonly known as:  LOPRESSOR TAKE 1 TABLET BY MOUTH TWICE DAILY. What changed:    how much to take  how to take this  when to take this   multivitamin capsule Take 1 capsule by mouth daily.   oxyCODONE 5 MG immediate release tablet Commonly known as:  Oxy IR/ROXICODONE Take 1-2 tablets (5-10 mg total) by mouth every 6 (six) hours as needed for up to 7 days for severe pain.   potassium chloride SA 20 MEQ tablet Commonly known as:  K-DUR,KLOR-CON Take 1 tablet (20 mEq total) by mouth daily. Start taking on:  05/04/2018 What changed:  how much to take   torsemide 20 MG tablet Commonly known as:  DEMADEX Take 2 tablets (40 mg total) by mouth daily. Start taking on:  05/04/2018 What changed:  when to  take this      Follow-up Information    [name], Elayne Snare, MD. Call in 1 day(s).   Specialty:  Family Medicine Contact information: [place] [address] [phone]        Josue Hector, MD. Call in 1 day(s).   Specialty:  Cardiology Why:  Please call the office for a 2 week follow-up appointment.  Contact information: [address] [facility]  [address] [place] [address] [phone]        [name], MD Follow up.   Specialty:  Cardiothoracic Surgery Why:  Your routine 4-week follow-up appointment is on May 28, 2018 at 3:30 PM.  Please arrive at 3 PM for a chest x-ray located at [facility] imaging which is on the first floor of our building. Contact information: [address] [place] [address] [place], Percy, PA-C Follow up.   Specialties:  Physician Assistant, Cardiology Why:  Bernerd Pho, PA-C 7/24 @1 :30 pm ([facility])  Contact information: [place] [address] [phone]          The patient has been discharged on:   1.Beta Blocker:  Yes [  x ]                              No   [   ]                              If No, reason:  2.Ace Inhibitor/ARB: Yes [ x  ]                                     No  [    ]                                     If No, reason:  3.Statin:   Yes [ x  ]                  No  [   ]                  If No, reason:  4.Ecasa:  Yes  [ x  ]                  No   [   ]                  If No, reason:    Signed: John Giovanni 05/03/2018, 10:57 AM

## 2018-05-02 NOTE — Progress Notes (Signed)
      [address]       [place] [address]             [phone]      5 Days Post-Op Procedure(s) (LRB): CORONARY ARTERY BYPASS GRAFTING (CABG) x Three , using left internal mammary artery and right leg greater saphenous vein (N/A) TRANSESOPHAGEAL ECHOCARDIOGRAM (TEE) (N/A) Subjective: Feels okay this morning. He did have a bad night since his demadex kept him awake.  Objective: Vital signs in last 24 hours: Temp:  [97.4 F (36.3 C)-98.3 F (36.8 C)] 97.8 F (36.6 C) (07/03 0503) Pulse Rate:  [72-77] 74 (07/03 0503) Cardiac Rhythm: Normal sinus rhythm (07/02 2100) Resp:  [17-26] 23 (07/03 0503) BP: (103-149)/(55-86) 124/66 (07/02 2016) SpO2:  [94 %-97 %] 97 % (07/03 0503) Weight:  [130.5 kg (287 lb 11.2 oz)] 130.5 kg (287 lb 11.2 oz) (07/03 0503)     Intake/Output from previous day: 07/02 0701 - 07/03 0700 In: 500 [P.O.:500] Out: -  Intake/Output this shift: No intake/output data recorded.  General appearance: alert, cooperative and no distress Heart: regular rate and rhythm, S1, S2 normal, no murmur, click, rub or gallop Lungs: clear to auscultation bilaterally Abdomen: soft, non-tender; bowel sounds normal; no masses,  no organomegaly Extremities: 1-2+ non-pitting pedal edema Wound: clean and dry  Lab Results: Recent Labs    04/30/18 0224 05/01/18 0240  WBC 10.8* 10.0  HGB 8.7* 8.5*  HCT 27.2* 26.7*  PLT 106* 141*   BMET:  Recent Labs    04/30/18 0224 05/01/18 0240  NA 136 137  K 4.1 4.0  CL 102 101  CO2 27 29  GLUCOSE 128* 118*  BUN 11 14  CREATININE 0.86 1.01  CALCIUM 8.3* 8.2*    PT/INR: No results for input(s): LABPROT, INR in the last 72 hours. ABG    Component Value Date/Time   PHART 7.332 (L) 04/28/2018 0349   HCO3 24.0 04/28/2018 0349   TCO2 24 04/28/2018 1654   ACIDBASEDEF 2.0 04/28/2018 0349   O2SAT 98.0 04/28/2018 0349   CBG (last 3)  Recent Labs    05/01/18 1532  GLUCAP 104*    Assessment/Plan: S/P  Procedure(s) (LRB): CORONARY ARTERY BYPASS GRAFTING (CABG) x Three , using left internal mammary artery and right leg greater saphenous vein (N/A) TRANSESOPHAGEAL ECHOCARDIOGRAM (TEE) (N/A)  1. CV-NSR in the 70s with PVCs, a few high BP reading this morning. Recent reading with SBP in the 140s. Continue Amio 400mg  BID, statin, ASA, ACEI, and metoprolol. Patient was on Cardura at home but he cannot remember the dose.  2. Pulm-last CXR showed small left apical pneumothorax. Mild atelectasis.  3. Renal- creatinine 1.01, electrolytes okay. Continue demadex for fluid overload. Remains 8 lbs over baseline.  4. Endo-blood glucose well controlled on current regimen.  5. H and H has been stable, platelets trending up 6. TSH elevated-discussed follow-up with his PCP for titration of Synthroid  Plan: Mobility limited by back pain. BP elevated this morning but could be due to incorrect BP cuff. Will re-evaluate this afternoon. Home later today vs. Tomorrow morning.     LOS: 8 days    [name] 05/02/2018

## 2018-05-02 NOTE — Progress Notes (Signed)
CARDIAC REHAB PHASE I   PRE:  Rate/Rhythm: 82 SR with PVCs    BP: sitting 117/69    SaO2: 95 RA  MODE:  Ambulation: 180 ft   POST:  Rate/Rhythm: 94 SR with PVCs    BP: sitting 155/63     SaO2: 96 RA  Pt c/o bad night and back pain. He has been walking with EVA walker, longer distances. Used rollator this am since he uses it PTA. Leans forward due to back issues. SOB with distance. Sat after 90 ft for rest. Walked back and returned to room. Pts ambulation is limited due to back. Ed completed with pt and wife. Encouraged intermittent walking at home, taking rests with rollator seat. Will refer to [facility].  [phone]   Darrick Meigs CES, ACSM 05/02/2018 8:57 AM

## 2018-05-02 NOTE — Discharge Instructions (Signed)

## 2018-05-02 NOTE — Care Management Important Message (Signed)
Important Message  Patient Details  Name: [name] MRN: [id number] Date of Birth: 12/18/1950   Medicare Important Message Given:  Yes    Kymani Laursen P Manvir Thorson 05/02/2018, 2:47 PM

## 2018-05-03 MED ORDER — LEVOTHYROXINE SODIUM 150 MCG PO TABS: 150.0000 ug | ORAL_TABLET | Freq: Every day | ORAL | 1 refills | Status: DC

## 2018-05-03 MED ORDER — POTASSIUM CHLORIDE CRYS ER 20 MEQ PO TBCR: 20.0000 meq | EXTENDED_RELEASE_TABLET | Freq: Every day | ORAL | 0 refills | Status: DC

## 2018-05-03 MED ORDER — TORSEMIDE 20 MG PO TABS: 40.0000 mg | ORAL_TABLET | Freq: Every day | ORAL | 0 refills | Status: DC

## 2018-05-03 MED ORDER — LEVOTHYROXINE SODIUM 75 MCG PO TABS: 150.0000 ug | ORAL_TABLET | Freq: Every day | ORAL | Status: DC

## 2018-05-03 MED ORDER — TORSEMIDE 20 MG PO TABS: 40.0000 mg | ORAL_TABLET | Freq: Every day | ORAL | Status: DC

## 2018-05-03 MED ORDER — ASPIRIN 81 MG PO TBEC: 81.0000 mg | DELAYED_RELEASE_TABLET | Freq: Every day | ORAL | Status: DC

## 2018-05-03 MED ORDER — LISINOPRIL 10 MG PO TABS: 10.0000 mg | ORAL_TABLET | Freq: Every day | ORAL | 1 refills | Status: DC

## 2018-05-03 MED ORDER — OXYCODONE HCL 5 MG PO TABS
5.0000 mg | ORAL_TABLET | Freq: Four times a day (QID) | ORAL | 0 refills | Status: AC | PRN
Start: 2018-05-03 — End: 2018-05-10

## 2018-05-03 MED ORDER — AMIODARONE HCL 200 MG PO TABS: 200.0000 mg | ORAL_TABLET | Freq: Two times a day (BID) | ORAL | 1 refills | Status: DC

## 2018-05-03 MED ORDER — POTASSIUM CHLORIDE CRYS ER 20 MEQ PO TBCR: 20.0000 meq | EXTENDED_RELEASE_TABLET | Freq: Every day | ORAL | Status: DC

## 2018-05-03 NOTE — Plan of Care (Signed)
  Problem: Pain Managment: Goal: General experience of comfort will improve Outcome: Progressing  Majority of pain that patient is experiencing is from chronic back pain and not his surgical sites. Pain medication is given on a schedule to reduce this discomfort.

## 2018-05-03 NOTE — Progress Notes (Signed)
Discharge instructions reviewed and prescriptions given to patient. Patient and wife deny questions. Patient was wheeled to family vehicle in stable condition with all belongings.

## 2018-05-03 NOTE — Care Management Note (Signed)
Case Management Note Marvetta Gibbons RN,BSN [facility] 1-22 Case Manager  [phone]  Patient Details  Name: [name] MRN: [id number] Date of Birth: 07-26-1951  Subjective/Objective:   Pt admitted with chest pain, cath reveled sever 3VD, pt s/p CABGx3 on 6/28                  Action/Plan: PTA pt lived at home with wife, anticipate return home, CM to follow for transition of care needs  Expected Discharge Date:  05/03/18               Expected Discharge Plan:  Home/Self Care(Completely independent, family will provide 24 hour supervison)  In-House Referral:  NA  Discharge planning Services  CM Consult  Post Acute Care Choice:  NA Choice offered to:  NA  DME Arranged:    DME Agency:     HH Arranged:    [place] Agency:     Status of Service:  Completed, signed off  If discussed at [employer] of Stay Meetings, dates discussed:    Discharge Disposition:  Home/self care   Additional Comments:  05/03/18- [place] RN, CM- pt for discharge home today, no CM needs noted for transition to home.   Dawayne Patricia, RN 05/03/2018, 11:25 AM

## 2018-05-03 NOTE — Progress Notes (Signed)
[address]       [name],[name] [id number]             [id number]      6 Days Post-Op Procedure(s) (LRB): CORONARY ARTERY BYPASS GRAFTING (CABG) x Three , using left internal mammary artery and right leg greater saphenous vein (N/A) TRANSESOPHAGEAL ECHOCARDIOGRAM (TEE) (N/A) Subjective: Feels well  Objective: Vital signs in last 24 hours: Temp:  [97.6 F (36.4 C)-98.8 F (37.1 C)] 98.8 F (37.1 C) (07/04 0609) Pulse Rate:  [68-72] 68 (07/04 0609) Cardiac Rhythm: Normal sinus rhythm (07/04 0800) Resp:  [14-22] 14 (07/04 0609) BP: (99-138)/(41-98) 138/65 (07/04 0609) SpO2:  [96 %-98 %] 96 % (07/04 0609) Weight:  [127.4 kg (280 lb 14.4 oz)] 127.4 kg (280 lb 14.4 oz) (07/04 0510)  Hemodynamic parameters for last 24 hours:    Intake/Output from previous day: 07/03 0701 - 07/04 0700 In: 351 [P.O.:345; I.V.:6] Out: -  Intake/Output this shift: Total I/O In: 118 [P.O.:118] Out: -   General appearance: alert, cooperative and no distress Heart: regular rate and rhythm Lungs: clear to auscultation bilaterally Abdomen: benign Extremities: minor edema Wound: incis healing well  Lab Results: Recent Labs    05/01/18 0240  WBC 10.0  HGB 8.5*  HCT 26.7*  PLT 141*   BMET:  Recent Labs    05/01/18 0240  NA 137  K 4.0  CL 101  CO2 29  GLUCOSE 118*  BUN 14  CREATININE 1.01  CALCIUM 8.2*    PT/INR: No results for input(s): LABPROT, INR in the last 72 hours. ABG    Component Value Date/Time   PHART 7.332 (L) 04/28/2018 0349   HCO3 24.0 04/28/2018 0349   TCO2 24 04/28/2018 1654   ACIDBASEDEF 2.0 04/28/2018 0349   O2SAT 98.0 04/28/2018 0349   CBG (last 3)  Recent Labs    05/01/18 1532  GLUCAP 104*    Meds Scheduled Meds: . amiodarone  400 mg Oral BID WC  . aspirin EC  81 mg Oral Daily  . atorvastatin  80 mg Oral q1800  . bisacodyl  10 mg Oral Daily   Or  . bisacodyl  10 mg Rectal Daily  . citalopram  20 mg Oral q morning - 10a  .  docusate sodium  200 mg Oral Daily  . levothyroxine  112 mcg Oral QAC breakfast  . lisinopril  10 mg Oral Daily  . metoprolol tartrate  50 mg Oral BID  . pantoprazole  40 mg Oral Daily  . potassium chloride SA  40 mEq Oral Daily  . sodium chloride flush  3 mL Intravenous Q12H  . sodium chloride flush  3 mL Intravenous Q12H  . tamsulosin  0.4 mg Oral Daily  . torsemide  40 mg Oral BID   Continuous Infusions: . sodium chloride     PRN Meds:.sodium chloride, metoprolol tartrate, ondansetron (ZOFRAN) IV, oxyCODONE, sodium chloride flush, sodium chloride flush, traMADol  Xrays No results found.  Assessment/Plan: S/P Procedure(s) (LRB): CORONARY ARTERY BYPASS GRAFTING (CABG) x Three , using left internal mammary artery and right leg greater saphenous vein (N/A) TRANSESOPHAGEAL ECHOCARDIOGRAM (TEE) (N/A)  1 conts to progress well, stable for discharge 2 hemodyn stable in sinus with some PVC's, EF45-50% on echo/. BP control is improved but remains variable - cont current RX 3 metabolic syndrome, O1B is 5.6- discussed lifestyle and nutrition changes and he seems motivated.  4 no new labs 5 volume overload is improved will reduce  demadex and send home with 1 week RX 6 TSH is >9, will increase synthroid and he will need outpatient F/U   LOS: 9 days    [name] 05/03/2018

## 2018-05-03 NOTE — Plan of Care (Signed)
  Problem: Education: Goal: Knowledge of General Education information will improve Outcome: Adequate for Discharge   Problem: Health Behavior/Discharge Planning: Goal: Ability to manage health-related needs will improve Outcome: Adequate for Discharge   Problem: Clinical Measurements: Goal: Ability to maintain clinical measurements within normal limits will improve Outcome: Adequate for Discharge Goal: Will remain free from infection Outcome: Adequate for Discharge Goal: Diagnostic test results will improve Outcome: Adequate for Discharge Goal: Cardiovascular complication will be avoided Outcome: Adequate for Discharge   Problem: Activity: Goal: Risk for activity intolerance will decrease Outcome: Adequate for Discharge   Problem: Coping: Goal: Level of anxiety will decrease Outcome: Adequate for Discharge   Problem: Elimination: Goal: Will not experience complications related to bowel motility Outcome: Adequate for Discharge Goal: Will not experience complications related to urinary retention Outcome: Adequate for Discharge   Problem: Pain Managment: Goal: General experience of comfort will improve Outcome: Adequate for Discharge   Problem: Safety: Goal: Ability to remain free from injury will improve Outcome: Adequate for Discharge   Problem: Skin Integrity: Goal: Risk for impaired skin integrity will decrease Outcome: Adequate for Discharge   Problem: Education: Goal: Ability to demonstrate proper wound care will improve Outcome: Adequate for Discharge Goal: Knowledge of disease or condition will improve Outcome: Adequate for Discharge Goal: Knowledge of the prescribed therapeutic regimen will improve Outcome: Adequate for Discharge   Problem: Activity: Goal: Risk for activity intolerance will decrease Outcome: Adequate for Discharge   Problem: Cardiac: Goal: Hemodynamic stability will improve Outcome: Adequate for Discharge   Problem: Clinical  Measurements: Goal: Postoperative complications will be avoided or minimized Outcome: Adequate for Discharge   Problem: Respiratory: Goal: Respiratory status will improve Outcome: Adequate for Discharge   Problem: Skin Integrity: Goal: Wound healing without signs and symptoms of infection Outcome: Adequate for Discharge Goal: Risk for impaired skin integrity will decrease Outcome: Adequate for Discharge   Problem: Urinary Elimination: Goal: Ability to achieve and maintain adequate renal perfusion and functioning will improve Outcome: Adequate for Discharge

## 2018-05-11 DIAGNOSIS — M5412 Radiculopathy, cervical region: Secondary | ICD-10-CM | POA: Diagnosis not present

## 2018-05-11 DIAGNOSIS — M4726 Other spondylosis with radiculopathy, lumbar region: Secondary | ICD-10-CM | POA: Diagnosis not present

## 2018-05-11 DIAGNOSIS — M961 Postlaminectomy syndrome, not elsewhere classified: Secondary | ICD-10-CM | POA: Diagnosis not present

## 2018-05-11 DIAGNOSIS — M62838 Other muscle spasm: Secondary | ICD-10-CM | POA: Diagnosis not present

## 2018-05-17 ENCOUNTER — Encounter (INDEPENDENT_AMBULATORY_CARE_PROVIDER_SITE_OTHER): Payer: Self-pay

## 2018-05-17 ENCOUNTER — Encounter: Payer: Self-pay | Admitting: Family Medicine

## 2018-05-22 NOTE — Progress Notes (Signed)
Cardiology Office Note    Date:  05/23/2018   ID:  [name], DOB 1950-11-11, MRN [id number]  PCP:  Kathyrn Drown, MD  Cardiologist: Jenkins Rouge, MD    Chief Complaint  Patient presents with  . Hospitalization Follow-up    s/p CABG    History of Present Illness:    [name] is a 67 y.o. male with past medical history of CAD (s/p prior stenting of LCx and RCA in 2003, stenting of OM in 2012), HTN, HLD, chronic ITP and Hypothyroidism who presents to the office today for hospital follow-up.  He was recently examined by Dr. Johnsie Burns on 04/19/2018 and reported episodes of exertional chest pain and dyspnea on exertion over the past several days. Given his symptoms being concerning for unstable angina, a cardiac catheterization was recommended for definitive evaluation. This was performed by Dr. Irish Burns on 04/24/2018 and showed severe three-vessel disease with occlusion of previously placed stents along the RCA and LCx and 75% LAD stenosis. CT surgery was consulted for consideration of CABG. He underwent CABG by Dr. Roxy Burns on 04/27/2018 with LIMA-LAD, SVG-OM1, and SVG-OM2. He did develop postoperative atrial fibrillation and converted back to normal sinus rhythm with initiation of IV Amiodarone. TSH was elevated to 9.090 and Synthroid dosing was appropriately adjusted. Platelet count remained stable and gradually trended upwards to 141 K on 05/01/2018.  He was discharged home on 05/03/2018 on ASA 81mg  daily, Amiodarone 200mg  BID, Atorvastatin 80mg  daily, Lisinopril 10mg  daily, Lopressor 50mg  BID, and Torsemide 40mg  daily.   In talking with the patient today, he reports that his breathing has significantly improved since his hospitalization.  He continues to have episodes of sternal chest discomfort following recent CABG but denies any exertional symptoms. He has remained on Fentanyl patches as he was on these prior to surgery due to chronic back pain.  He denies any recent orthopnea, PND, lower  extremity edema, or palpitations. No evidence of active bleeding.  He has been following weights on his home scales and this has continued to decline since his hospitalization.  Reports he has made multiple changes in his diet.   Past Medical History:  Diagnosis Date  . Allergy   . Carpal tunnel syndrome   . Chronic back pain   . Chronic combined systolic and diastolic congestive heart failure ([place])   . Chronic ITP (idiopathic thrombocytopenia) (H[facility]) 05/25/2015  . Coronary artery disease   . Coronary artery disease involving native coronary artery of native heart with angina pectoris ([place])   . Degenerative disc disease   . GERD (gastroesophageal reflux disease)   . History of thrombocytopenia   . Hypercholesteremia   . Hypertension   . Hypothyroid   . Lumbar pain   . Myocardial infarction ([place]) 2009  . Obesity   . S/P CABG x 3 04/27/2018   LIMA to LAD SVG to OM1 SVG to OM2  . Shortness of breath dyspnea     Past Surgical History:  Procedure Laterality Date  . BACK SURGERY     5 lumbas disc with cervical and lumbar fusions  . BIOPSY N/A 03/06/2014   Procedure: ESOPHAGEAL BIOPSIES;  Surgeon: Oscar Houston, MD;  Location: [facility];  Service: Endoscopy;  Laterality: N/A;  . COLONOSCOPY    . COLONOSCOPY WITH PROPOFOL N/A 03/06/2014   Procedure: COLONOSCOPY WITH PROPOFOL;  Surgeon: Oscar Houston, MD;  Location: [facility];  Service: Endoscopy;  Laterality: N/A;  in cecum at 0807; total withdrawal time 9  minutes  . CORONARY ANGIOPLASTY WITH STENT PLACEMENT     2000, and 2004 has 3 stents  . CORONARY ARTERY BYPASS GRAFT N/A 04/27/2018   Procedure: CORONARY ARTERY BYPASS GRAFTING (CABG) x Three , using left internal mammary artery and right leg greater saphenous vein;  Surgeon: Oscar Alberts, MD;  Location: [facility];  Service: Open Heart Surgery;  Laterality: N/A;  . ESOPHAGOGASTRODUODENOSCOPY (EGD) WITH PROPOFOL N/A 03/06/2014   Procedure: ESOPHAGOGASTRODUODENOSCOPY (EGD) WITH PROPOFOL;   Surgeon: Oscar Houston, MD;  Location: [facility];  Service: Endoscopy;  Laterality: N/A;  . LEFT HEART CATH AND CORONARY ANGIOGRAPHY N/A 04/24/2018   Procedure: LEFT HEART CATH AND CORONARY ANGIOGRAPHY;  Surgeon: Oscar Booze, MD;  Location: [facility];  Service: Cardiovascular;  Laterality: N/A;  . LUMBAR FUSION  2009  . MALONEY DILATION N/A 03/06/2014   Procedure: MALONEY DILATION 54 french;  Surgeon: Oscar Houston, MD;  Location: [facility];  Service: Endoscopy;  Laterality: N/A;  . NECK SURGERY    . TEE WITHOUT CARDIOVERSION N/A 04/27/2018   Procedure: TRANSESOPHAGEAL ECHOCARDIOGRAM (TEE);  Surgeon: Oscar Alberts, MD;  Location: [facility];  Service: Open Heart Surgery;  Laterality: N/A;    Current Medications: Outpatient Medications Prior to Visit  Medication Sig Dispense Refill  . aspirin EC 81 MG EC tablet Take 1 tablet (81 mg total) by mouth daily.    Marland Kitchen atorvastatin (LIPITOR) 80 MG tablet Take 1 tablet (80 mg total) by mouth daily. 90 tablet 0  . citalopram (CELEXA) 20 MG tablet Take 20 mg by mouth daily.    Marland Kitchen doxazosin (CARDURA) 4 MG tablet Take 4 mg by mouth daily.    . fentaNYL (DURAGESIC - DOSED MCG/HR) 25 MCG/HR patch APP 1 PA EXT TO THE SKIN Q 72 H  0  . levothyroxine (SYNTHROID, LEVOTHROID) 150 MCG tablet Take 1 tablet (150 mcg total) by mouth daily before breakfast. 30 tablet 1  . lisinopril (PRINIVIL,ZESTRIL) 10 MG tablet Take 1 tablet (10 mg total) by mouth daily. 30 tablet 1  . metoprolol tartrate (LOPRESSOR) 50 MG tablet TAKE 1 TABLET BY MOUTH TWICE DAILY. (Patient taking differently: TAKE 1 TABLET(50mg ) BY MOUTH TWICE DAILY.) 60 tablet 5  . Multiple Vitamin (MULTIVITAMIN) capsule Take 1 capsule by mouth daily.      . pantoprazole (PROTONIX) 40 MG tablet Take 40 mg by mouth daily.    . potassium chloride SA (K-DUR,KLOR-CON) 20 MEQ tablet Take 1 tablet (20 mEq total) by mouth daily. 7 tablet 0  . tamsulosin (FLOMAX) 0.4 MG CAPS capsule Take 0.4 mg by mouth.    .  torsemide (DEMADEX) 20 MG tablet Take 2 tablets (40 mg total) by mouth daily. 14 tablet 0  . albuterol (PROVENTIL HFA;VENTOLIN HFA) 108 (90 Base) MCG/ACT inhaler Inhale 2 puffs into the lungs every 4 (four) hours as needed for wheezing or shortness of breath. 1 Inhaler 2  . amiodarone (PACERONE) 200 MG tablet Take 1 tablet (200 mg total) by mouth 2 (two) times daily. 60 tablet 1  . cetirizine (ZYRTEC) 10 MG tablet Take 10 mg by mouth at bedtime.       No facility-administered medications prior to visit.      Allergies:   Penicillins; Sulfa antibiotics; and Zoloft [sertraline hcl]   Social History   Socioeconomic History  . Marital status: Married    Spouse name: Not on file  . Number of children: Not on file  . Years of education: Not on file  .  Highest education level: Not on file  Occupational History  . Not on file  Social Needs  . Financial resource strain: Not on file  . Food insecurity:    Worry: Not on file    Inability: Not on file  . Transportation needs:    Medical: Not on file    Non-medical: Not on file  Tobacco Use  . Smoking status: Former Smoker    Packs/day: 2.00    Years: 20.00    Pack years: 40.00    Types: Cigarettes    Last attempt to quit: 07/06/1991    Years since quitting: 26.8  . Smokeless tobacco: Never Used  Substance and Sexual Activity  . Alcohol use: No    Comment: occassional  . Drug use: No  . Sexual activity: Not Currently    Birth control/protection: None  Lifestyle  . Physical activity:    Days per week: Not on file    Minutes per session: Not on file  . Stress: Not on file  Relationships  . Social connections:    Talks on phone: Not on file    Gets together: Not on file    Attends religious service: Not on file    Active member of club or organization: Not on file    Attends meetings of clubs or organizations: Not on file    Relationship status: Not on file  Other Topics Concern  . Not on file  Social History Narrative    Disabled due to chronic back pain     Family History:  The patient's family history includes Heart attack in his father.   Review of Systems:   Please see the history of present illness.     General:  No chills, fever, night sweats or weight changes. Positive for chronic back pain.  Cardiovascular:  No dyspnea on exertion, edema, orthopnea, palpitations, paroxysmal nocturnal dyspnea. Positive for sternal chest pain (along incision).  Dermatological: No rash, lesions/masses Respiratory: No cough, dyspnea Urologic: No hematuria, dysuria Abdominal:   No nausea, vomiting, diarrhea, bright red blood per rectum, melena, or hematemesis Neurologic:  No visual changes, wkns, changes in mental status.  All other systems reviewed and are otherwise negative except as noted above.   Physical Exam:    VS:  BP (!) 118/56   Pulse 72   Ht 5\' 10"  (1.778 m)   Wt 271 lb (122.9 kg)   SpO2 95%   BMI 38.88 kg/m    General: Well developed, well nourished Caucasian male appearing in no acute distress. Head: Normocephalic, atraumatic, sclera non-icteric, no xanthomas, nares are without discharge.  Neck: No carotid bruits. JVD not elevated.  Lungs: Respirations regular and unlabored, without wheezes or rales.  Heart: Regular rate and rhythm. No S3 or S4.  No murmur, no rubs, or gallops appreciated. Sternal incision appears well-healing with no erythema or drainage noted.  Abdomen: Soft, non-tender, non-distended with normoactive bowel sounds. No hepatomegaly. No rebound/guarding. No obvious abdominal masses. Msk:  Strength and tone appear normal for age. No joint deformities or effusions. Extremities: No clubbing or cyanosis. No lower extremity edema.  Distal pedal pulses are 2+ bilaterally. Neuro: Alert and oriented X 3. Moves all extremities spontaneously. No focal deficits noted. Psych:  Responds to questions appropriately with a normal affect. Skin: No rashes or lesions noted  Wt Readings from Last  3 Encounters:  05/23/18 271 lb (122.9 kg)  05/03/18 280 lb 14.4 oz (127.4 kg)  04/19/18 277 lb 12.8 oz (126 kg)  Studies/Labs Reviewed:   EKG:  EKG is ordered today. The ekg ordered today demonstrates NSR, HR 67, with T-wave abnormalities along inferior and lateral leads (similar to prior tracings).   Recent Labs: 04/25/2018: ALT 12; B Natriuretic Peptide 152.8 04/28/2018: Magnesium 2.4 05/01/2018: BUN 14; Creatinine, Ser 1.01; Hemoglobin 8.5; Platelets 141; Potassium 4.0; Sodium 137; TSH 9.090   Lipid Panel    Component Value Date/Time   CHOL 149 04/25/2018 0358   CHOL 213 (H) 06/06/2016 0906   TRIG 125 04/25/2018 0358   HDL 26 (L) 04/25/2018 0358   HDL 43 06/06/2016 0906   CHOLHDL 5.7 04/25/2018 0358   VLDL 25 04/25/2018 0358   LDLCALC 98 04/25/2018 0358   LDLCALC 156 (H) 06/06/2016 0906    Additional studies/ records that were reviewed today include:   Cardiac Catheterization: 04/24/2018  Prox RCA lesion is 95% stenosed.  Dist RCA-2 lesion is 100% stenosed.  Dist RCA-1 lesion is 99% stenosed.  Ost 1st Mrg lesion is 100% stenosed.  Lat 1st Mrg lesion is 100% stenosed.  Ost LAD to Prox LAD lesion is 50% stenosed.  Mid LAD-1 lesion is 75% stenosed. This is seen best in teh LAO caudal view.  Mid LAD-2 lesion is 50% stenosed.  There is mild left ventricular systolic dysfunction.  The left ventricular ejection fraction is 45-50% by visual estimate.  LV end diastolic pressure is normal.  There is no aortic valve stenosis.   Severe three vessel disease including occlusion of both prior stented segments.  De novo LAD lesion, beset seen in the LAD caudal view, and clearly worse from prior cath.  Cardiac surgery consult.    Will admit since he reports pain at rest.  Will hold off on heparin for now given prior platelet issues.  May need to start if pain occurs.   Echocardiogram: 04/25/2018 Study Conclusions  - Left ventricle: The cavity size was normal.  Systolic function was   mildly reduced. The estimated ejection fraction was in the range   of 45% to 50%. Hypokinesis of the inferolateral, anterior and   anteroseptal, and anterolateral myocardium. Doppler parameters   are consistent with abnormal left ventricular relaxation (grade 1   diastolic dysfunction). Doppler parameters are consistent with   high ventricular filling pressure. - Aortic valve: Transvalvular velocity was within the normal range.   There was no stenosis. There was mild regurgitation directed   towards the mitral anterior leaflet. Regurgitation pressure   half-time: 887 ms. - Aorta: Aortic root dimension: 39 mm (ED). - Ascending aorta: The ascending aorta was mildly dilated. - Mitral valve: Transvalvular velocity was within the normal range.   There was no evidence for stenosis. There was trivial   regurgitation. - Left atrium: The atrium was severely dilated. - Right ventricle: The cavity size was normal. Wall thickness was   normal. Systolic function was normal. - Tricuspid valve: There was trivial regurgitation. - Pulmonary arteries: Systolic pressure was within the normal   range.  Assessment:    1. Coronary artery disease involving native coronary artery of native heart without angina pectoris   2. S/P CABG x 3   3. Postoperative atrial fibrillation (HCC)   4. Essential hypertension   5. Hyperlipidemia LDL goal <70   6. Chronic ITP (idiopathic thrombocytopenia) ([facility])   7. Peripheral edema      Plan:   In order of problems listed above:  1. CAD - s/p prior stenting of LCx and RCA in 2003 with stenting of OM in  2012. Recently underwent a cardiac catheterization due to unstable angina and cath showed severe three-vessel disease. He underwent CABG by Dr. Roxy Burns on 04/27/2018 with LIMA-LAD, SVG-OM1, and SVG-OM2.   - He has overall been progressing well since his recent hospitalization and has noted significant improvement in his respiratory status. Sternal  chest discomfort continues to improve. He denies any exertional symptoms. Will enter a referral to cardiac rehab here at [facility]. - Continue ASA (on 81mg  daily due to known ITP), statin, and beta-blocker therapy.  2. Post-Operative Atrial Fibrillation - Occurred following his recent CABG and he converted to normal sinus rhythm following initiation of Amiodarone. He denies any recent palpitations and is maintaining normal sinus rhythm today. - Will reduce Amiodarone to 200 mg once daily and plan to stop within the next 4 to 6 weeks if no recurrence.   3. HTN - BP is well controlled at 118/56 during today's visit. I have asked him to continue to follow this in the ambulatory setting for if he continues to experience weight loss due to dietary changes, we may need to decrease his Lisinopril dose.  - Continue Lisinopril 10 mg daily and Lopressor 50 mg twice daily at this time.  4. HLD - FLP during recent admission showed total cholesterol 149, triglycerides 125, HDL 26, and LDL 98. Goal LDL is less than 70 in the setting of known CAD. - Continue Atorvastatin 80 mg daily.  5. ITP - Trough platelet count was 79K on 04/28/2018, improved to 141K at the time of most recent check on 05/01/2018. - He denies any evidence of active bleeding. Will remain on ASA 81 mg daily. Has follow-up with his PCP on Friday and is anticipating repeat labs at that time.   6. Chronic Venous Stasis - No significant edema appreciated on examination today. He remains on Torsemide 40 mg daily. He is now following a low-sodium diet.     Medication Adjustments/Labs and Tests Ordered: Current medicines are reviewed at length with the patient today.  Concerns regarding medicines are outlined above.  Medication changes, Labs and Tests ordered today are listed in the Patient Instructions below. Patient Instructions  Medication Instructions:  Your physician recommends that you continue on your current medications as directed.  Please refer to the Current Medication list given to you today.  Decrease Amiodarone to 200 mg Daily   Labwork: NONE   Testing/Procedures: NONE   Follow-Up: You have been referred to Cardiac Rehab  Your physician recommends that you schedule a follow-up appointment in: 3 Months with Dr. Johnsie Burns  Any Other Special Instructions Will Be Listed Below (If Applicable).  If you need a refill on your cardiac medications before your next appointment, please call your pharmacy.  Thank you for choosing [facility]!    Signed, Erma Heritage, PA-C  05/23/2018 2:16 PM    [facility] [address] [place], [place] [address] Phone: [phone]

## 2018-05-23 ENCOUNTER — Encounter: Payer: Self-pay | Admitting: Student

## 2018-05-23 ENCOUNTER — Ambulatory Visit: Payer: Medicare Other | Admitting: Student

## 2018-05-23 VITALS — BP 118/56 | HR 72 | Ht 70.0 in | Wt 271.0 lb

## 2018-05-23 DIAGNOSIS — E7849 Other hyperlipidemia: Secondary | ICD-10-CM | POA: Diagnosis not present

## 2018-05-23 DIAGNOSIS — I2510 Atherosclerotic heart disease of native coronary artery without angina pectoris: Secondary | ICD-10-CM

## 2018-05-23 DIAGNOSIS — I10 Essential (primary) hypertension: Secondary | ICD-10-CM

## 2018-05-23 DIAGNOSIS — E785 Hyperlipidemia, unspecified: Secondary | ICD-10-CM

## 2018-05-23 DIAGNOSIS — I4891 Unspecified atrial fibrillation: Secondary | ICD-10-CM

## 2018-05-23 DIAGNOSIS — Z951 Presence of aortocoronary bypass graft: Secondary | ICD-10-CM | POA: Diagnosis not present

## 2018-05-23 DIAGNOSIS — R609 Edema, unspecified: Secondary | ICD-10-CM

## 2018-05-23 DIAGNOSIS — D693 Immune thrombocytopenic purpura: Secondary | ICD-10-CM

## 2018-05-23 DIAGNOSIS — E038 Other specified hypothyroidism: Secondary | ICD-10-CM | POA: Diagnosis not present

## 2018-05-23 DIAGNOSIS — I9789 Other postprocedural complications and disorders of the circulatory system, not elsewhere classified: Secondary | ICD-10-CM

## 2018-05-23 DIAGNOSIS — Z79899 Other long term (current) drug therapy: Secondary | ICD-10-CM | POA: Diagnosis not present

## 2018-05-23 MED ORDER — AMIODARONE HCL 200 MG PO TABS: 200.0000 mg | ORAL_TABLET | Freq: Every day | ORAL | 3 refills | Status: DC

## 2018-05-23 NOTE — Patient Instructions (Signed)
Medication Instructions:  Your physician recommends that you continue on your current medications as directed. Please refer to the Current Medication list given to you today.  Decrease Amiodarone to 200 mg Daily   Labwork: NONE   Testing/Procedures: NONE   Follow-Up: You have been referred to Cardiac Rehab  Your physician recommends that you schedule a follow-up appointment in: 3 Months with Dr. Johnsie Cancel    Any Other Special Instructions Will Be Listed Below (If Applicable).     If you need a refill on your cardiac medications before your next appointment, please call your pharmacy.  Thank you for choosing [facility]!

## 2018-05-24 LAB — CBC WITH DIFFERENTIAL/PLATELET
BASOS ABS: 0 10*3/uL (ref 0.0–0.2)
Basos: 1 %
EOS (ABSOLUTE): 0.5 10*3/uL — AB (ref 0.0–0.4)
Eos: 8 %
Hematocrit: 33.7 % — ABNORMAL LOW (ref 37.5–51.0)
Hemoglobin: 11 g/dL — ABNORMAL LOW (ref 13.0–17.7)
Immature Grans (Abs): 0 10*3/uL (ref 0.0–0.1)
Immature Granulocytes: 1 %
Lymphocytes Absolute: 1.1 10*3/uL (ref 0.7–3.1)
Lymphs: 16 %
MCH: 29.1 pg (ref 26.6–33.0)
MCHC: 32.6 g/dL (ref 31.5–35.7)
MCV: 89 fL (ref 79–97)
MONOS ABS: 0.3 10*3/uL (ref 0.1–0.9)
Monocytes: 5 %
Neutrophils Absolute: 4.6 10*3/uL (ref 1.4–7.0)
Neutrophils: 69 %
Platelets: 201 10*3/uL (ref 150–450)
RBC: 3.78 x10E6/uL — ABNORMAL LOW (ref 4.14–5.80)
RDW: 14.5 % (ref 12.3–15.4)
WBC: 6.6 10*3/uL (ref 3.4–10.8)

## 2018-05-24 LAB — HEPATIC FUNCTION PANEL
ALT: 14 IU/L (ref 0–44)
AST: 11 IU/L (ref 0–40)
Albumin: 3.9 g/dL (ref 3.6–4.8)
Alkaline Phosphatase: 177 IU/L — ABNORMAL HIGH (ref 39–117)
BILIRUBIN TOTAL: 0.3 mg/dL (ref 0.0–1.2)
BILIRUBIN, DIRECT: 0.11 mg/dL (ref 0.00–0.40)
Total Protein: 6.4 g/dL (ref 6.0–8.5)

## 2018-05-24 LAB — PSA: Prostate Specific Ag, Serum: 0.5 ng/mL (ref 0.0–4.0)

## 2018-05-24 LAB — BASIC METABOLIC PANEL
BUN/Creatinine Ratio: 11 (ref 10–24)
BUN: 12 mg/dL (ref 8–27)
CO2: 25 mmol/L (ref 20–29)
Calcium: 9 mg/dL (ref 8.6–10.2)
Chloride: 98 mmol/L (ref 96–106)
Creatinine, Ser: 1.07 mg/dL (ref 0.76–1.27)
GFR calc Af Amer: 83 mL/min/{1.73_m2} (ref >59)
GFR, EST NON AFRICAN AMERICAN: 71 mL/min/{1.73_m2} (ref >59)
GLUCOSE: 135 mg/dL — AB (ref 65–99)
POTASSIUM: 4.4 mmol/L (ref 3.5–5.2)
SODIUM: 139 mmol/L (ref 134–144)

## 2018-05-24 LAB — LIPID PANEL
CHOL/HDL RATIO: 4.5 ratio (ref 0.0–5.0)
CHOLESTEROL TOTAL: 156 mg/dL (ref 100–199)
HDL: 35 mg/dL — ABNORMAL LOW (ref >39)
LDL Calculated: 98 mg/dL (ref 0–99)
Triglycerides: 113 mg/dL (ref 0–149)
VLDL Cholesterol Cal: 23 mg/dL (ref 5–40)

## 2018-05-24 LAB — TSH: TSH: 2.74 u[IU]/mL (ref 0.450–4.500)

## 2018-05-25 ENCOUNTER — Ambulatory Visit: Payer: Medicare Other | Admitting: Family Medicine

## 2018-05-25 ENCOUNTER — Encounter: Payer: Self-pay | Admitting: Family Medicine

## 2018-05-25 ENCOUNTER — Other Ambulatory Visit: Payer: Self-pay | Admitting: Thoracic Surgery (Cardiothoracic Vascular Surgery)

## 2018-05-25 VITALS — BP 108/70 | Ht 71.0 in | Wt 268.0 lb

## 2018-05-25 DIAGNOSIS — Z951 Presence of aortocoronary bypass graft: Secondary | ICD-10-CM

## 2018-05-25 DIAGNOSIS — E038 Other specified hypothyroidism: Secondary | ICD-10-CM | POA: Diagnosis not present

## 2018-05-25 DIAGNOSIS — E7849 Other hyperlipidemia: Secondary | ICD-10-CM | POA: Diagnosis not present

## 2018-05-25 DIAGNOSIS — Z79899 Other long term (current) drug therapy: Secondary | ICD-10-CM

## 2018-05-25 DIAGNOSIS — R5383 Other fatigue: Secondary | ICD-10-CM | POA: Diagnosis not present

## 2018-05-25 MED ORDER — ROSUVASTATIN CALCIUM 40 MG PO TABS: ORAL_TABLET | ORAL | 1 refills | Status: DC

## 2018-05-25 NOTE — Progress Notes (Signed)
Subjective:    Patient ID: [name], male    DOB: January 21, 1951, 67 y.o.   MRN: [id number]  HPI  Patient is here today to discuss medications.He states his tyroid functions were off when Dr.Owens checked it and he wanted to discuss the levothyroxine dose he is on.Needs advise on when to take certain medications, am or pm.  Patient does have hypothyroidism was on good dose but recently his test shows a needed to be on more than increase the amount and they recommend for him to follow-up.  He states energy level fair since having his open heart surgery  He does get dizzy when he stands up feels lightheaded at times feels like he is gone pass out in addition this blood pressure is been alarmingly low according to the wife he has not had any actual passing out  He has had some bruising he has a history of thrombocytopenia that he wanted to get his labs checked  Hyperlipidemia with underlying heart disease taking Lipitor on a regular basis tolerating it well states he is trying to improve his diet  Results for orders placed or performed during the hospital encounter of 04/24/18  MRSA PCR Screening  Result Value Ref Range   MRSA by PCR NEGATIVE NEGATIVE  CBC  Result Value Ref Range   WBC 7.0 4.0 - 10.5 K/uL   RBC 4.61 4.22 - 5.81 MIL/uL   Hemoglobin 13.7 13.0 - 17.0 g/dL   HCT 42.3 39.0 - 52.0 %   MCV 91.8 78.0 - 100.0 fL   MCH 29.7 26.0 - 34.0 pg   MCHC 32.4 30.0 - 36.0 g/dL   RDW 14.0 11.5 - 15.5 %   Platelets 127 (L) 150 - 400 K/uL  Creatinine, serum  Result Value Ref Range   Creatinine, Ser 1.03 0.61 - 1.24 mg/dL   GFR calc non Af Amer >60 >60 mL/min   GFR calc Af Amer >60 >60 mL/min  Blood gas, arterial  Result Value Ref Range   FIO2 21.00    Delivery systems ROOM AIR    pH, Arterial 7.416 7.350 - 7.450   pCO2 arterial 43.3 32.0 - 48.0 mmHg   pO2, Arterial 77.5 (L) 83.0 - 108.0 mmHg   Bicarbonate 27.3 20.0 - 28.0 mmol/L   Acid-Base Excess 3.1 (H) 0.0 - 2.0 mmol/L   O2  Saturation 95.4 %   Patient temperature 98.6    Collection site LEFT RADIAL    Drawn by [phone]    Sample type ARTERIAL DRAW    Allens test (pass/fail) PASS PASS  CBC  Result Value Ref Range   WBC 6.1 4.0 - 10.5 K/uL   RBC 4.11 (L) 4.22 - 5.81 MIL/uL   Hemoglobin 12.3 (L) 13.0 - 17.0 g/dL   HCT 38.3 (L) 39.0 - 52.0 %   MCV 93.2 78.0 - 100.0 fL   MCH 29.9 26.0 - 34.0 pg   MCHC 32.1 30.0 - 36.0 g/dL   RDW 13.9 11.5 - 15.5 %   Platelets 118 (L) 150 - 400 K/uL  Comprehensive metabolic panel  Result Value Ref Range   Sodium 141 135 - 145 mmol/L   Potassium 3.5 3.5 - 5.1 mmol/L   Chloride 103 98 - 111 mmol/L   CO2 28 22 - 32 mmol/L   Glucose, Bld 90 70 - 99 mg/dL   BUN 14 8 - 23 mg/dL   Creatinine, Ser 0.98 0.61 - 1.24 mg/dL   Calcium 8.5 (L) 8.9 - 10.3 mg/dL  Total Protein 5.7 (L) 6.5 - 8.1 g/dL   Albumin 3.4 (L) 3.5 - 5.0 g/dL   AST 13 (L) 15 - 41 U/L   ALT 12 0 - 44 U/L   Alkaline Phosphatase 75 38 - 126 U/L   Total Bilirubin 1.1 0.3 - 1.2 mg/dL   GFR calc non Af Amer >60 >60 mL/min   GFR calc Af Amer >60 >60 mL/min   Anion gap 10 5 - 15  Hemoglobin A1c  Result Value Ref Range   Hgb A1c MFr Bld 5.6 4.8 - 5.6 %   Mean Plasma Glucose 114.02 mg/dL  Lipid panel  Result Value Ref Range   Cholesterol 149 0 - 200 mg/dL   Triglycerides 125 <150 mg/dL   HDL 26 (L) >40 mg/dL   Total CHOL/HDL Ratio 5.7 RATIO   VLDL 25 0 - 40 mg/dL   LDL Cholesterol 98 0 - 99 mg/dL  Prealbumin  Result Value Ref Range   Prealbumin 25.9 18 - 38 mg/dL  Protime-INR  Result Value Ref Range   Prothrombin Time 13.5 11.4 - 15.2 seconds   INR 1.03   APTT  Result Value Ref Range   aPTT 30 24 - 36 seconds  Troponin I  Result Value Ref Range   Troponin I <0.03 <0.03 ng/mL  Brain natriuretic peptide  Result Value Ref Range   B Natriuretic Peptide 152.8 (H) 0.0 - 100.0 pg/mL  Heparin level (unfractionated)  Result Value Ref Range   Heparin Unfractionated 0.24 (L) 0.30 - 0.70 IU/mL  Urinalysis,  Routine w reflex microscopic  Result Value Ref Range   Color, Urine STRAW (A) YELLOW   APPearance CLEAR CLEAR   Specific Gravity, Urine 1.005 1.005 - 1.030   pH 6.0 5.0 - 8.0   Glucose, UA NEGATIVE NEGATIVE mg/dL   Hgb urine dipstick NEGATIVE NEGATIVE   Bilirubin Urine NEGATIVE NEGATIVE   Ketones, ur NEGATIVE NEGATIVE mg/dL   Protein, ur NEGATIVE NEGATIVE mg/dL   Nitrite NEGATIVE NEGATIVE   Leukocytes, UA NEGATIVE NEGATIVE  Glucose, capillary  Result Value Ref Range   Glucose-Capillary 94 70 - 99 mg/dL  Hemoglobin and hematocrit, blood  Result Value Ref Range   Hemoglobin 9.5 (L) 13.0 - 17.0 g/dL   HCT 28.6 (L) 39.0 - 52.0 %  Platelet count  Result Value Ref Range   Platelets 96 (L) 150 - 400 K/uL  CBC  Result Value Ref Range   WBC 9.9 4.0 - 10.5 K/uL   RBC 3.51 (L) 4.22 - 5.81 MIL/uL   Hemoglobin 10.7 (L) 13.0 - 17.0 g/dL   HCT 32.9 (L) 39.0 - 52.0 %   MCV 93.7 78.0 - 100.0 fL   MCH 30.5 26.0 - 34.0 pg   MCHC 32.5 30.0 - 36.0 g/dL   RDW 13.9 11.5 - 15.5 %   Platelets 94 (L) 150 - 400 K/uL  Protime-INR  Result Value Ref Range   Prothrombin Time 15.7 (H) 11.4 - 15.2 seconds   INR 1.27   APTT  Result Value Ref Range   aPTT 31 24 - 36 seconds  Glucose, capillary  Result Value Ref Range   Glucose-Capillary 126 (H) 70 - 99 mg/dL  Magnesium  Result Value Ref Range   Magnesium 2.8 (H) 1.7 - 2.4 mg/dL  Creatinine, serum  Result Value Ref Range   Creatinine, Ser 0.78 0.61 - 1.24 mg/dL   GFR calc non Af Amer >60 >60 mL/min   GFR calc Af Amer >60 >60 mL/min  CBC  Result Value Ref Range   WBC 10.3 4.0 - 10.5 K/uL   RBC 3.08 (L) 4.22 - 5.81 MIL/uL   Hemoglobin 9.2 (L) 13.0 - 17.0 g/dL   HCT 29.4 (L) 39.0 - 52.0 %   MCV 95.5 78.0 - 100.0 fL   MCH 29.9 26.0 - 34.0 pg   MCHC 31.3 30.0 - 36.0 g/dL   RDW 14.0 11.5 - 15.5 %   Platelets 102 (L) 150 - 400 K/uL  CBC  Result Value Ref Range   WBC 6.1 4.0 - 10.5 K/uL   RBC 2.73 (L) 4.22 - 5.81 MIL/uL   Hemoglobin 8.3 (L)  13.0 - 17.0 g/dL   HCT 26.2 (L) 39.0 - 52.0 %   MCV 96.0 78.0 - 100.0 fL   MCH 30.4 26.0 - 34.0 pg   MCHC 31.7 30.0 - 36.0 g/dL   RDW 14.1 11.5 - 15.5 %   Platelets 79 (L) 150 - 400 K/uL  Basic metabolic panel  Result Value Ref Range   Sodium 139 135 - 145 mmol/L   Potassium 4.2 3.5 - 5.1 mmol/L   Chloride 109 98 - 111 mmol/L   CO2 25 22 - 32 mmol/L   Glucose, Bld 109 (H) 70 - 99 mg/dL   BUN 6 (L) 8 - 23 mg/dL   Creatinine, Ser 0.77 0.61 - 1.24 mg/dL   Calcium 7.2 (L) 8.9 - 10.3 mg/dL   GFR calc non Af Amer >60 >60 mL/min   GFR calc Af Amer >60 >60 mL/min   Anion gap 5 5 - 15  Magnesium  Result Value Ref Range   Magnesium 2.6 (H) 1.7 - 2.4 mg/dL  Glucose, capillary  Result Value Ref Range   Glucose-Capillary 126 (H) 70 - 99 mg/dL  Glucose, capillary  Result Value Ref Range   Glucose-Capillary 107 (H) 70 - 99 mg/dL  Glucose, capillary  Result Value Ref Range   Glucose-Capillary 128 (H) 70 - 99 mg/dL  Glucose, capillary  Result Value Ref Range   Glucose-Capillary 119 (H) 70 - 99 mg/dL  Glucose, capillary  Result Value Ref Range   Glucose-Capillary 110 (H) 70 - 99 mg/dL   Comment 1 Notify RN   Magnesium  Result Value Ref Range   Magnesium 2.4 1.7 - 2.4 mg/dL  CBC  Result Value Ref Range   WBC 11.3 (H) 4.0 - 10.5 K/uL   RBC 3.21 (L) 4.22 - 5.81 MIL/uL   Hemoglobin 9.8 (L) 13.0 - 17.0 g/dL   HCT 30.8 (L) 39.0 - 52.0 %   MCV 96.0 78.0 - 100.0 fL   MCH 30.5 26.0 - 34.0 pg   MCHC 31.8 30.0 - 36.0 g/dL   RDW 14.1 11.5 - 15.5 %   Platelets 108 (L) 150 - 400 K/uL  Creatinine, serum  Result Value Ref Range   Creatinine, Ser 0.96 0.61 - 1.24 mg/dL   GFR calc non Af Amer >60 >60 mL/min   GFR calc Af Amer >60 >60 mL/min  Glucose, capillary  Result Value Ref Range   Glucose-Capillary 107 (H) 70 - 99 mg/dL   Comment 1 Notify RN   Glucose, capillary  Result Value Ref Range   Glucose-Capillary 104 (H) 70 - 99 mg/dL   Comment 1 Arterial Specimen    Comment 2 Notify RN     Glucose, capillary  Result Value Ref Range   Glucose-Capillary 108 (H) 70 - 99 mg/dL   Comment 1 Capillary Specimen   Basic metabolic panel  Result Value  Ref Range   Sodium 136 135 - 145 mmol/L   Potassium 4.0 3.5 - 5.1 mmol/L   Chloride 105 98 - 111 mmol/L   CO2 28 22 - 32 mmol/L   Glucose, Bld 141 (H) 70 - 99 mg/dL   BUN 9 8 - 23 mg/dL   Creatinine, Ser 0.89 0.61 - 1.24 mg/dL   Calcium 8.0 (L) 8.9 - 10.3 mg/dL   GFR calc non Af Amer >60 >60 mL/min   GFR calc Af Amer >60 >60 mL/min   Anion gap 3 (L) 5 - 15  CBC  Result Value Ref Range   WBC 10.9 (H) 4.0 - 10.5 K/uL   RBC 2.95 (L) 4.22 - 5.81 MIL/uL   Hemoglobin 8.8 (L) 13.0 - 17.0 g/dL   HCT 28.3 (L) 39.0 - 52.0 %   MCV 95.9 78.0 - 100.0 fL   MCH 29.8 26.0 - 34.0 pg   MCHC 31.1 30.0 - 36.0 g/dL   RDW 13.9 11.5 - 15.5 %   Platelets 99 (L) 150 - 400 K/uL  Basic metabolic panel  Result Value Ref Range   Sodium 136 135 - 145 mmol/L   Potassium 4.1 3.5 - 5.1 mmol/L   Chloride 102 98 - 111 mmol/L   CO2 27 22 - 32 mmol/L   Glucose, Bld 128 (H) 70 - 99 mg/dL   BUN 11 8 - 23 mg/dL   Creatinine, Ser 0.86 0.61 - 1.24 mg/dL   Calcium 8.3 (L) 8.9 - 10.3 mg/dL   GFR calc non Af Amer >60 >60 mL/min   GFR calc Af Amer >60 >60 mL/min   Anion gap 7 5 - 15  CBC  Result Value Ref Range   WBC 10.8 (H) 4.0 - 10.5 K/uL   RBC 2.92 (L) 4.22 - 5.81 MIL/uL   Hemoglobin 8.7 (L) 13.0 - 17.0 g/dL   HCT 27.2 (L) 39.0 - 52.0 %   MCV 93.2 78.0 - 100.0 fL   MCH 29.8 26.0 - 34.0 pg   MCHC 32.0 30.0 - 36.0 g/dL   RDW 13.7 11.5 - 15.5 %   Platelets 106 (L) 150 - 400 K/uL  CBC  Result Value Ref Range   WBC 10.0 4.0 - 10.5 K/uL   RBC 2.82 (L) 4.22 - 5.81 MIL/uL   Hemoglobin 8.5 (L) 13.0 - 17.0 g/dL   HCT 26.7 (L) 39.0 - 52.0 %   MCV 94.7 78.0 - 100.0 fL   MCH 30.1 26.0 - 34.0 pg   MCHC 31.8 30.0 - 36.0 g/dL   RDW 14.2 11.5 - 15.5 %   Platelets 141 (L) 150 - 400 K/uL  Basic metabolic panel  Result Value Ref Range   Sodium 137 135 - 145  mmol/L   Potassium 4.0 3.5 - 5.1 mmol/L   Chloride 101 98 - 111 mmol/L   CO2 29 22 - 32 mmol/L   Glucose, Bld 118 (H) 70 - 99 mg/dL   BUN 14 8 - 23 mg/dL   Creatinine, Ser 1.01 0.61 - 1.24 mg/dL   Calcium 8.2 (L) 8.9 - 10.3 mg/dL   GFR calc non Af Amer >60 >60 mL/min   GFR calc Af Amer >60 >60 mL/min   Anion gap 7 5 - 15  TSH  Result Value Ref Range   TSH 9.090 (H) 0.350 - 4.500 uIU/mL  Glucose, capillary  Result Value Ref Range   Glucose-Capillary 104 (H) 70 - 99 mg/dL  I-STAT, chem 8  Result Value Ref Range  Sodium 141 135 - 145 mmol/L   Potassium 3.5 3.5 - 5.1 mmol/L   Chloride 101 98 - 111 mmol/L   BUN 5 (L) 8 - 23 mg/dL   Creatinine, Ser 0.60 (L) 0.61 - 1.24 mg/dL   Glucose, Bld 103 (H) 70 - 99 mg/dL   Calcium, Ion 1.22 1.15 - 1.40 mmol/L   TCO2 28 22 - 32 mmol/L   Hemoglobin 11.2 (L) 13.0 - 17.0 g/dL   HCT 33.0 (L) 39.0 - 52.0 %  I-STAT, chem 8  Result Value Ref Range   Sodium 140 135 - 145 mmol/L   Potassium 3.6 3.5 - 5.1 mmol/L   Chloride 102 98 - 111 mmol/L   BUN 5 (L) 8 - 23 mg/dL   Creatinine, Ser 0.60 (L) 0.61 - 1.24 mg/dL   Glucose, Bld 97 70 - 99 mg/dL   Calcium, Ion 1.24 1.15 - 1.40 mmol/L   TCO2 26 22 - 32 mmol/L   Hemoglobin 10.2 (L) 13.0 - 17.0 g/dL   HCT 30.0 (L) 39.0 - 52.0 %  I-STAT, chem 8  Result Value Ref Range   Sodium 141 135 - 145 mmol/L   Potassium 3.1 (L) 3.5 - 5.1 mmol/L   Chloride 98 98 - 111 mmol/L   BUN 4 (L) 8 - 23 mg/dL   Creatinine, Ser 0.50 (L) 0.61 - 1.24 mg/dL   Glucose, Bld 88 70 - 99 mg/dL   Calcium, Ion 1.07 (L) 1.15 - 1.40 mmol/L   TCO2 27 22 - 32 mmol/L   Hemoglobin 8.8 (L) 13.0 - 17.0 g/dL   HCT 26.0 (L) 39.0 - 52.0 %  I-STAT 3, arterial blood gas (G3+)  Result Value Ref Range   pH, Arterial 7.404 7.350 - 7.450   pCO2 arterial 42.7 32.0 - 48.0 mmHg   pO2, Arterial 294.0 (H) 83.0 - 108.0 mmHg   Bicarbonate 26.7 20.0 - 28.0 mmol/L   TCO2 28 22 - 32 mmol/L   O2 Saturation 100.0 %   Acid-Base Excess 2.0 0.0 - 2.0  mmol/L   Patient temperature HIDE    Sample type ARTERIAL   I-STAT, chem 8  Result Value Ref Range   Sodium 139 135 - 145 mmol/L   Potassium 4.6 3.5 - 5.1 mmol/L   Chloride 103 98 - 111 mmol/L   BUN 5 (L) 8 - 23 mg/dL   Creatinine, Ser 0.60 (L) 0.61 - 1.24 mg/dL   Glucose, Bld 125 (H) 70 - 99 mg/dL   Calcium, Ion 1.12 (L) 1.15 - 1.40 mmol/L   TCO2 25 22 - 32 mmol/L   Hemoglobin 9.2 (L) 13.0 - 17.0 g/dL   HCT 27.0 (L) 39.0 - 52.0 %  I-STAT, chem 8  Result Value Ref Range   Sodium 140 135 - 145 mmol/L   Potassium 4.1 3.5 - 5.1 mmol/L   Chloride 102 98 - 111 mmol/L   BUN 4 (L) 8 - 23 mg/dL   Creatinine, Ser 0.60 (L) 0.61 - 1.24 mg/dL   Glucose, Bld 122 (H) 70 - 99 mg/dL   Calcium, Ion 1.11 (L) 1.15 - 1.40 mmol/L   TCO2 26 22 - 32 mmol/L   Hemoglobin 9.2 (L) 13.0 - 17.0 g/dL   HCT 27.0 (L) 39.0 - 52.0 %  I-STAT 3, arterial blood gas (G3+)  Result Value Ref Range   pH, Arterial 7.350 7.350 - 7.450   pCO2 arterial 44.1 32.0 - 48.0 mmHg   pO2, Arterial 197.0 (H) 83.0 - 108.0 mmHg  Bicarbonate 24.3 20.0 - 28.0 mmol/L   TCO2 26 22 - 32 mmol/L   O2 Saturation 100.0 %   Acid-base deficit 1.0 0.0 - 2.0 mmol/L   Patient temperature HIDE    Sample type ARTERIAL   I-STAT 3, arterial blood gas (G3+)  Result Value Ref Range   pH, Arterial 7.317 (L) 7.350 - 7.450   pCO2 arterial 49.5 (H) 32.0 - 48.0 mmHg   pO2, Arterial 129.0 (H) 83.0 - 108.0 mmHg   Bicarbonate 25.5 20.0 - 28.0 mmol/L   TCO2 27 22 - 32 mmol/L   O2 Saturation 99.0 %   Acid-base deficit 1.0 0.0 - 2.0 mmol/L   Patient temperature 36.3 C    Collection site ARTERIAL LINE    Drawn by Operator    Sample type ARTERIAL   I-STAT 3, arterial blood gas (G3+)  Result Value Ref Range   pH, Arterial 7.278 (L) 7.350 - 7.450   pCO2 arterial 51.4 (H) 32.0 - 48.0 mmHg   pO2, Arterial 123.0 (H) 83.0 - 108.0 mmHg   Bicarbonate 24.2 20.0 - 28.0 mmol/L   TCO2 26 22 - 32 mmol/L   O2 Saturation 98.0 %   Acid-base deficit 3.0 (H) 0.0  - 2.0 mmol/L   Patient temperature 36.6 C    Sample type ARTERIAL   I-STAT, chem 8  Result Value Ref Range   Sodium 141 135 - 145 mmol/L   Potassium 4.5 3.5 - 5.1 mmol/L   Chloride 106 98 - 111 mmol/L   BUN 5 (L) 8 - 23 mg/dL   Creatinine, Ser 0.60 (L) 0.61 - 1.24 mg/dL   Glucose, Bld 118 (H) 70 - 99 mg/dL   Calcium, Ion 1.11 (L) 1.15 - 1.40 mmol/L   TCO2 24 22 - 32 mmol/L   Hemoglobin 8.5 (L) 13.0 - 17.0 g/dL   HCT 25.0 (L) 39.0 - 52.0 %  I-STAT 3, arterial blood gas (G3+)  Result Value Ref Range   pH, Arterial 7.332 (L) 7.350 - 7.450   pCO2 arterial 45.7 32.0 - 48.0 mmHg   pO2, Arterial 117.0 (H) 83.0 - 108.0 mmHg   Bicarbonate 24.0 20.0 - 28.0 mmol/L   TCO2 25 22 - 32 mmol/L   O2 Saturation 98.0 %   Acid-base deficit 2.0 0.0 - 2.0 mmol/L   Patient temperature 37.7 C    Collection site ARTERIAL LINE    Drawn by Nurse    Sample type ARTERIAL   I-STAT, chem 8  Result Value Ref Range   Sodium 138 135 - 145 mmol/L   Potassium 4.1 3.5 - 5.1 mmol/L   Chloride 102 98 - 111 mmol/L   BUN 7 (L) 8 - 23 mg/dL   Creatinine, Ser 0.70 0.61 - 1.24 mg/dL   Glucose, Bld 135 (H) 70 - 99 mg/dL   Calcium, Ion 1.14 (L) 1.15 - 1.40 mmol/L   TCO2 24 22 - 32 mmol/L   Hemoglobin 9.9 (L) 13.0 - 17.0 g/dL   HCT 29.0 (L) 39.0 - 52.0 %  I-STAT 4, (NA,K, GLUC, HGB,HCT)  Result Value Ref Range   Sodium 141 135 - 145 mmol/L   Potassium 3.9 3.5 - 5.1 mmol/L   Glucose, Bld 142 (H) 70 - 99 mg/dL   HCT 30.0 (L) 39.0 - 52.0 %   Hemoglobin 10.2 (L) 13.0 - 17.0 g/dL  ECHOCARDIOGRAM COMPLETE  Result Value Ref Range   Weight 4,352 oz   Height 71 in   BP 137/78 mmHg  ECHO INTRAOPERATIVE TEE  Result Value Ref Range   Weight 4,494.4 oz   Height 71 in   BP 142/83 mmHg  Type and screen [place]  Result Value Ref Range   ABO/RH(D) O POS    Antibody Screen NEG    Sample Expiration      04/28/2018 Performed at [facility], [place] [address] [place], [place] [address]     Pulmonary function test  Result Value Ref Range   FVC-Pre 3.84 L   FVC-%Pred-Pre 81 %   FVC-Post 3.84 L   FVC-%Pred-Post 81 %   FVC-%Change-Post 0 %   FEV1-Pre 2.84 L   FEV1-%Pred-Pre 81 %   FEV1-Post 2.93 L   FEV1-%Pred-Post 84 %   FEV1-%Change-Post 3 %   FEV6-Pre 3.84 L   FEV6-%Pred-Pre 86 %   FEV6-Post 3.84 L   FEV6-%Pred-Post 86 %   FEV6-%Change-Post 0 %   Pre FEV1/FVC ratio 74 %   FEV1FVC-%Pred-Pre 99 %   Post FEV1/FVC ratio 76 %   FEV1FVC-%Change-Post 3 %   Pre FEV6/FVC Ratio 100 %   FEV6FVC-%Pred-Pre 105 %   Post FEV6/FVC ratio 100 %   FEV6FVC-%Pred-Post 105 %   FEV6FVC-%Change-Post 0 %   FEF 25-75 Pre 2.22 L/sec   FEF2575-%Pred-Pre 82 %   FEF 25-75 Post 2.46 L/sec   FEF2575-%Pred-Post 90 %   FEF2575-%Change-Post 10 %   RV 2.87 L   RV % pred 117 %   TLC 6.48 L   TLC % pred 89 %   DLCO unc 26.16 ml/min/mmHg   DLCO unc % pred 77 %   DLCO cor 28.18 ml/min/mmHg   DLCO cor % pred 83 %   DL/VA 5.20 ml/min/mmHg/L   DL/VA % pred 111 %    Review of Systems  Constitutional: Negative for diaphoresis and fatigue.  HENT: Negative for congestion and rhinorrhea.   Respiratory: Negative for cough and shortness of breath.   Cardiovascular: Negative for chest pain and leg swelling.  Gastrointestinal: Negative for abdominal pain and diarrhea.  Skin: Negative for color change and rash.  Neurological: Negative for dizziness and headaches.  Psychiatric/Behavioral: Negative for behavioral problems and confusion.       Objective:   Physical Exam  Constitutional: He appears well-nourished. No distress.  HENT:  Head: Normocephalic and atraumatic.  Eyes: Right eye exhibits no discharge. Left eye exhibits no discharge.  Neck: No tracheal deviation present.  Cardiovascular: Normal rate, regular rhythm and normal heart sounds.  No murmur heard. Pulmonary/Chest: Effort normal and breath sounds normal. No respiratory distress.  Musculoskeletal: He exhibits no edema.   Lymphadenopathy:    He has no cervical adenopathy.  Neurological: He is alert. Coordination normal.  Skin: Skin is warm and dry.  Psychiatric: He has a normal mood and affect. His behavior is normal.  Vitals reviewed.  Blood pressure was checked 106/70 sitting when he stood 80 over palpable    25 minutes was spent with the patient.  This statement verifies that 25 minutes was indeed spent with the patient.  More than 50% of this visit-total duration of the visit-was spent in counseling and coordination of care. The issues that the patient came in for today as reflected in the diagnosis (s) please refer to documentation for further details.     Assessment & Plan:  Excessive blood pressure control stop doxazosin may continue tamsulosin will recheck blood pressure laying sitting standing in 2 weeks follow-up sooner problems notify us if ongoing real low blood pressures may  need to reduce lisinopril from 10-5  History thrombocytopenia platelet count looks good currently  Hyperlipidemia in order to lessen the risk of heart disease I believe he needs to be on a stronger medication stop Lipitor start Crestor 40 mg daily we will recheck lipid liver in approximately 10 to 12 weeks  Hypothyroidism current dose looks like it should be good but I recommend recheck TSH in 10 to 12 weeks  History of renal insufficiency and low potassium recheck metabolic 7 in 10 to 12 weeks  Follow-up with cardiology and cardiac rehab as recommended by them

## 2018-05-25 NOTE — Patient Instructions (Addendum)
Results for orders placed or performed during the hospital encounter of 04/24/18  MRSA PCR Screening  Result Value Ref Range   MRSA by PCR NEGATIVE NEGATIVE  CBC  Result Value Ref Range   WBC 7.0 4.0 - 10.5 K/uL   RBC 4.61 4.22 - 5.81 MIL/uL   Hemoglobin 13.7 13.0 - 17.0 g/dL   HCT 42.3 39.0 - 52.0 %   MCV 91.8 78.0 - 100.0 fL   MCH 29.7 26.0 - 34.0 pg   MCHC 32.4 30.0 - 36.0 g/dL   RDW 14.0 11.5 - 15.5 %   Platelets 127 (L) 150 - 400 K/uL  Creatinine, serum  Result Value Ref Range   Creatinine, Ser 1.03 0.61 - 1.24 mg/dL   GFR calc non Af Amer >60 >60 mL/min   GFR calc Af Amer >60 >60 mL/min  Blood gas, arterial  Result Value Ref Range   FIO2 21.00    Delivery systems ROOM AIR    pH, Arterial 7.416 7.350 - 7.450   pCO2 arterial 43.3 32.0 - 48.0 mmHg   pO2, Arterial 77.5 (L) 83.0 - 108.0 mmHg   Bicarbonate 27.3 20.0 - 28.0 mmol/L   Acid-Base Excess 3.1 (H) 0.0 - 2.0 mmol/L   O2 Saturation 95.4 %   Patient temperature 98.6    Collection site LEFT RADIAL    Drawn by [phone]    Sample type ARTERIAL DRAW    Allens test (pass/fail) PASS PASS  CBC  Result Value Ref Range   WBC 6.1 4.0 - 10.5 K/uL   RBC 4.11 (L) 4.22 - 5.81 MIL/uL   Hemoglobin 12.3 (L) 13.0 - 17.0 g/dL   HCT 38.3 (L) 39.0 - 52.0 %   MCV 93.2 78.0 - 100.0 fL   MCH 29.9 26.0 - 34.0 pg   MCHC 32.1 30.0 - 36.0 g/dL   RDW 13.9 11.5 - 15.5 %   Platelets 118 (L) 150 - 400 K/uL  Comprehensive metabolic panel  Result Value Ref Range   Sodium 141 135 - 145 mmol/L   Potassium 3.5 3.5 - 5.1 mmol/L   Chloride 103 98 - 111 mmol/L   CO2 28 22 - 32 mmol/L   Glucose, Bld 90 70 - 99 mg/dL   BUN 14 8 - 23 mg/dL   Creatinine, Ser 0.98 0.61 - 1.24 mg/dL   Calcium 8.5 (L) 8.9 - 10.3 mg/dL   Total Protein 5.7 (L) 6.5 - 8.1 g/dL   Albumin 3.4 (L) 3.5 - 5.0 g/dL   AST 13 (L) 15 - 41 U/L   ALT 12 0 - 44 U/L   Alkaline Phosphatase 75 38 - 126 U/L   Total Bilirubin 1.1 0.3 - 1.2 mg/dL   GFR calc non Af Amer >60 >60 mL/min    GFR calc Af Amer >60 >60 mL/min   Anion gap 10 5 - 15  Hemoglobin A1c  Result Value Ref Range   Hgb A1c MFr Bld 5.6 4.8 - 5.6 %   Mean Plasma Glucose 114.02 mg/dL  Lipid panel  Result Value Ref Range   Cholesterol 149 0 - 200 mg/dL   Triglycerides 125 <150 mg/dL   HDL 26 (L) >40 mg/dL   Total CHOL/HDL Ratio 5.7 RATIO   VLDL 25 0 - 40 mg/dL   LDL Cholesterol 98 0 - 99 mg/dL  Prealbumin  Result Value Ref Range   Prealbumin 25.9 18 - 38 mg/dL  Protime-INR  Result Value Ref Range   Prothrombin Time 13.5 11.4 - 15.2 seconds  INR 1.03   APTT  Result Value Ref Range   aPTT 30 24 - 36 seconds  Troponin I  Result Value Ref Range   Troponin I <0.03 <0.03 ng/mL  Brain natriuretic peptide  Result Value Ref Range   B Natriuretic Peptide 152.8 (H) 0.0 - 100.0 pg/mL  Heparin level (unfractionated)  Result Value Ref Range   Heparin Unfractionated 0.24 (L) 0.30 - 0.70 IU/mL  Urinalysis, Routine w reflex microscopic  Result Value Ref Range   Color, Urine STRAW (A) YELLOW   APPearance CLEAR CLEAR   Specific Gravity, Urine 1.005 1.005 - 1.030   pH 6.0 5.0 - 8.0   Glucose, UA NEGATIVE NEGATIVE mg/dL   Hgb urine dipstick NEGATIVE NEGATIVE   Bilirubin Urine NEGATIVE NEGATIVE   Ketones, ur NEGATIVE NEGATIVE mg/dL   Protein, ur NEGATIVE NEGATIVE mg/dL   Nitrite NEGATIVE NEGATIVE   Leukocytes, UA NEGATIVE NEGATIVE  Glucose, capillary  Result Value Ref Range   Glucose-Capillary 94 70 - 99 mg/dL  Hemoglobin and hematocrit, blood  Result Value Ref Range   Hemoglobin 9.5 (L) 13.0 - 17.0 g/dL   HCT 28.6 (L) 39.0 - 52.0 %  Platelet count  Result Value Ref Range   Platelets 96 (L) 150 - 400 K/uL  CBC  Result Value Ref Range   WBC 9.9 4.0 - 10.5 K/uL   RBC 3.51 (L) 4.22 - 5.81 MIL/uL   Hemoglobin 10.7 (L) 13.0 - 17.0 g/dL   HCT 32.9 (L) 39.0 - 52.0 %   MCV 93.7 78.0 - 100.0 fL   MCH 30.5 26.0 - 34.0 pg   MCHC 32.5 30.0 - 36.0 g/dL   RDW 13.9 11.5 - 15.5 %   Platelets 94 (L) 150 - 400  K/uL  Protime-INR  Result Value Ref Range   Prothrombin Time 15.7 (H) 11.4 - 15.2 seconds   INR 1.27   APTT  Result Value Ref Range   aPTT 31 24 - 36 seconds  Glucose, capillary  Result Value Ref Range   Glucose-Capillary 126 (H) 70 - 99 mg/dL  Magnesium  Result Value Ref Range   Magnesium 2.8 (H) 1.7 - 2.4 mg/dL  Creatinine, serum  Result Value Ref Range   Creatinine, Ser 0.78 0.61 - 1.24 mg/dL   GFR calc non Af Amer >60 >60 mL/min   GFR calc Af Amer >60 >60 mL/min  CBC  Result Value Ref Range   WBC 10.3 4.0 - 10.5 K/uL   RBC 3.08 (L) 4.22 - 5.81 MIL/uL   Hemoglobin 9.2 (L) 13.0 - 17.0 g/dL   HCT 29.4 (L) 39.0 - 52.0 %   MCV 95.5 78.0 - 100.0 fL   MCH 29.9 26.0 - 34.0 pg   MCHC 31.3 30.0 - 36.0 g/dL   RDW 14.0 11.5 - 15.5 %   Platelets 102 (L) 150 - 400 K/uL  CBC  Result Value Ref Range   WBC 6.1 4.0 - 10.5 K/uL   RBC 2.73 (L) 4.22 - 5.81 MIL/uL   Hemoglobin 8.3 (L) 13.0 - 17.0 g/dL   HCT 26.2 (L) 39.0 - 52.0 %   MCV 96.0 78.0 - 100.0 fL   MCH 30.4 26.0 - 34.0 pg   MCHC 31.7 30.0 - 36.0 g/dL   RDW 14.1 11.5 - 15.5 %   Platelets 79 (L) 150 - 400 K/uL  Basic metabolic panel  Result Value Ref Range   Sodium 139 135 - 145 mmol/L   Potassium 4.2 3.5 - 5.1 mmol/L  Chloride 109 98 - 111 mmol/L   CO2 25 22 - 32 mmol/L   Glucose, Bld 109 (H) 70 - 99 mg/dL   BUN 6 (L) 8 - 23 mg/dL   Creatinine, Ser 0.77 0.61 - 1.24 mg/dL   Calcium 7.2 (L) 8.9 - 10.3 mg/dL   GFR calc non Af Amer >60 >60 mL/min   GFR calc Af Amer >60 >60 mL/min   Anion gap 5 5 - 15  Magnesium  Result Value Ref Range   Magnesium 2.6 (H) 1.7 - 2.4 mg/dL  Glucose, capillary  Result Value Ref Range   Glucose-Capillary 126 (H) 70 - 99 mg/dL  Glucose, capillary  Result Value Ref Range   Glucose-Capillary 107 (H) 70 - 99 mg/dL  Glucose, capillary  Result Value Ref Range   Glucose-Capillary 128 (H) 70 - 99 mg/dL  Glucose, capillary  Result Value Ref Range   Glucose-Capillary 119 (H) 70 - 99 mg/dL   Glucose, capillary  Result Value Ref Range   Glucose-Capillary 110 (H) 70 - 99 mg/dL   Comment 1 Notify RN   Magnesium  Result Value Ref Range   Magnesium 2.4 1.7 - 2.4 mg/dL  CBC  Result Value Ref Range   WBC 11.3 (H) 4.0 - 10.5 K/uL   RBC 3.21 (L) 4.22 - 5.81 MIL/uL   Hemoglobin 9.8 (L) 13.0 - 17.0 g/dL   HCT 30.8 (L) 39.0 - 52.0 %   MCV 96.0 78.0 - 100.0 fL   MCH 30.5 26.0 - 34.0 pg   MCHC 31.8 30.0 - 36.0 g/dL   RDW 14.1 11.5 - 15.5 %   Platelets 108 (L) 150 - 400 K/uL  Creatinine, serum  Result Value Ref Range   Creatinine, Ser 0.96 0.61 - 1.24 mg/dL   GFR calc non Af Amer >60 >60 mL/min   GFR calc Af Amer >60 >60 mL/min  Glucose, capillary  Result Value Ref Range   Glucose-Capillary 107 (H) 70 - 99 mg/dL   Comment 1 Notify RN   Glucose, capillary  Result Value Ref Range   Glucose-Capillary 104 (H) 70 - 99 mg/dL   Comment 1 Arterial Specimen    Comment 2 Notify RN   Glucose, capillary  Result Value Ref Range   Glucose-Capillary 108 (H) 70 - 99 mg/dL   Comment 1 Capillary Specimen   Basic metabolic panel  Result Value Ref Range   Sodium 136 135 - 145 mmol/L   Potassium 4.0 3.5 - 5.1 mmol/L   Chloride 105 98 - 111 mmol/L   CO2 28 22 - 32 mmol/L   Glucose, Bld 141 (H) 70 - 99 mg/dL   BUN 9 8 - 23 mg/dL   Creatinine, Ser 0.89 0.61 - 1.24 mg/dL   Calcium 8.0 (L) 8.9 - 10.3 mg/dL   GFR calc non Af Amer >60 >60 mL/min   GFR calc Af Amer >60 >60 mL/min   Anion gap 3 (L) 5 - 15  CBC  Result Value Ref Range   WBC 10.9 (H) 4.0 - 10.5 K/uL   RBC 2.95 (L) 4.22 - 5.81 MIL/uL   Hemoglobin 8.8 (L) 13.0 - 17.0 g/dL   HCT 28.3 (L) 39.0 - 52.0 %   MCV 95.9 78.0 - 100.0 fL   MCH 29.8 26.0 - 34.0 pg   MCHC 31.1 30.0 - 36.0 g/dL   RDW 13.9 11.5 - 15.5 %   Platelets 99 (L) 150 - 400 K/uL  Basic metabolic panel  Result Value Ref Range  Sodium 136 135 - 145 mmol/L   Potassium 4.1 3.5 - 5.1 mmol/L   Chloride 102 98 - 111 mmol/L   CO2 27 22 - 32 mmol/L   Glucose, Bld  128 (H) 70 - 99 mg/dL   BUN 11 8 - 23 mg/dL   Creatinine, Ser 0.86 0.61 - 1.24 mg/dL   Calcium 8.3 (L) 8.9 - 10.3 mg/dL   GFR calc non Af Amer >60 >60 mL/min   GFR calc Af Amer >60 >60 mL/min   Anion gap 7 5 - 15  CBC  Result Value Ref Range   WBC 10.8 (H) 4.0 - 10.5 K/uL   RBC 2.92 (L) 4.22 - 5.81 MIL/uL   Hemoglobin 8.7 (L) 13.0 - 17.0 g/dL   HCT 27.2 (L) 39.0 - 52.0 %   MCV 93.2 78.0 - 100.0 fL   MCH 29.8 26.0 - 34.0 pg   MCHC 32.0 30.0 - 36.0 g/dL   RDW 13.7 11.5 - 15.5 %   Platelets 106 (L) 150 - 400 K/uL  CBC  Result Value Ref Range   WBC 10.0 4.0 - 10.5 K/uL   RBC 2.82 (L) 4.22 - 5.81 MIL/uL   Hemoglobin 8.5 (L) 13.0 - 17.0 g/dL   HCT 26.7 (L) 39.0 - 52.0 %   MCV 94.7 78.0 - 100.0 fL   MCH 30.1 26.0 - 34.0 pg   MCHC 31.8 30.0 - 36.0 g/dL   RDW 14.2 11.5 - 15.5 %   Platelets 141 (L) 150 - 400 K/uL  Basic metabolic panel  Result Value Ref Range   Sodium 137 135 - 145 mmol/L   Potassium 4.0 3.5 - 5.1 mmol/L   Chloride 101 98 - 111 mmol/L   CO2 29 22 - 32 mmol/L   Glucose, Bld 118 (H) 70 - 99 mg/dL   BUN 14 8 - 23 mg/dL   Creatinine, Ser 1.01 0.61 - 1.24 mg/dL   Calcium 8.2 (L) 8.9 - 10.3 mg/dL   GFR calc non Af Amer >60 >60 mL/min   GFR calc Af Amer >60 >60 mL/min   Anion gap 7 5 - 15  TSH  Result Value Ref Range   TSH 9.090 (H) 0.350 - 4.500 uIU/mL  Glucose, capillary  Result Value Ref Range   Glucose-Capillary 104 (H) 70 - 99 mg/dL  I-STAT, chem 8  Result Value Ref Range   Sodium 141 135 - 145 mmol/L   Potassium 3.5 3.5 - 5.1 mmol/L   Chloride 101 98 - 111 mmol/L   BUN 5 (L) 8 - 23 mg/dL   Creatinine, Ser 0.60 (L) 0.61 - 1.24 mg/dL   Glucose, Bld 103 (H) 70 - 99 mg/dL   Calcium, Ion 1.22 1.15 - 1.40 mmol/L   TCO2 28 22 - 32 mmol/L   Hemoglobin 11.2 (L) 13.0 - 17.0 g/dL   HCT 33.0 (L) 39.0 - 52.0 %  I-STAT, chem 8  Result Value Ref Range   Sodium 140 135 - 145 mmol/L   Potassium 3.6 3.5 - 5.1 mmol/L   Chloride 102 98 - 111 mmol/L   BUN 5 (L) 8 -  23 mg/dL   Creatinine, Ser 0.60 (L) 0.61 - 1.24 mg/dL   Glucose, Bld 97 70 - 99 mg/dL   Calcium, Ion 1.24 1.15 - 1.40 mmol/L   TCO2 26 22 - 32 mmol/L   Hemoglobin 10.2 (L) 13.0 - 17.0 g/dL   HCT 30.0 (L) 39.0 - 52.0 %  I-STAT, chem 8  Result Value Ref  Range   Sodium 141 135 - 145 mmol/L   Potassium 3.1 (L) 3.5 - 5.1 mmol/L   Chloride 98 98 - 111 mmol/L   BUN 4 (L) 8 - 23 mg/dL   Creatinine, Ser 0.50 (L) 0.61 - 1.24 mg/dL   Glucose, Bld 88 70 - 99 mg/dL   Calcium, Ion 1.07 (L) 1.15 - 1.40 mmol/L   TCO2 27 22 - 32 mmol/L   Hemoglobin 8.8 (L) 13.0 - 17.0 g/dL   HCT 26.0 (L) 39.0 - 52.0 %  I-STAT 3, arterial blood gas (G3+)  Result Value Ref Range   pH, Arterial 7.404 7.350 - 7.450   pCO2 arterial 42.7 32.0 - 48.0 mmHg   pO2, Arterial 294.0 (H) 83.0 - 108.0 mmHg   Bicarbonate 26.7 20.0 - 28.0 mmol/L   TCO2 28 22 - 32 mmol/L   O2 Saturation 100.0 %   Acid-Base Excess 2.0 0.0 - 2.0 mmol/L   Patient temperature HIDE    Sample type ARTERIAL   I-STAT, chem 8  Result Value Ref Range   Sodium 139 135 - 145 mmol/L   Potassium 4.6 3.5 - 5.1 mmol/L   Chloride 103 98 - 111 mmol/L   BUN 5 (L) 8 - 23 mg/dL   Creatinine, Ser 0.60 (L) 0.61 - 1.24 mg/dL   Glucose, Bld 125 (H) 70 - 99 mg/dL   Calcium, Ion 1.12 (L) 1.15 - 1.40 mmol/L   TCO2 25 22 - 32 mmol/L   Hemoglobin 9.2 (L) 13.0 - 17.0 g/dL   HCT 27.0 (L) 39.0 - 52.0 %  I-STAT, chem 8  Result Value Ref Range   Sodium 140 135 - 145 mmol/L   Potassium 4.1 3.5 - 5.1 mmol/L   Chloride 102 98 - 111 mmol/L   BUN 4 (L) 8 - 23 mg/dL   Creatinine, Ser 0.60 (L) 0.61 - 1.24 mg/dL   Glucose, Bld 122 (H) 70 - 99 mg/dL   Calcium, Ion 1.11 (L) 1.15 - 1.40 mmol/L   TCO2 26 22 - 32 mmol/L   Hemoglobin 9.2 (L) 13.0 - 17.0 g/dL   HCT 27.0 (L) 39.0 - 52.0 %  I-STAT 3, arterial blood gas (G3+)  Result Value Ref Range   pH, Arterial 7.350 7.350 - 7.450   pCO2 arterial 44.1 32.0 - 48.0 mmHg   pO2, Arterial 197.0 (H) 83.0 - 108.0 mmHg   Bicarbonate  24.3 20.0 - 28.0 mmol/L   TCO2 26 22 - 32 mmol/L   O2 Saturation 100.0 %   Acid-base deficit 1.0 0.0 - 2.0 mmol/L   Patient temperature HIDE    Sample type ARTERIAL   I-STAT 3, arterial blood gas (G3+)  Result Value Ref Range   pH, Arterial 7.317 (L) 7.350 - 7.450   pCO2 arterial 49.5 (H) 32.0 - 48.0 mmHg   pO2, Arterial 129.0 (H) 83.0 - 108.0 mmHg   Bicarbonate 25.5 20.0 - 28.0 mmol/L   TCO2 27 22 - 32 mmol/L   O2 Saturation 99.0 %   Acid-base deficit 1.0 0.0 - 2.0 mmol/L   Patient temperature 36.3 C    Collection site ARTERIAL LINE    Drawn by Operator    Sample type ARTERIAL   I-STAT 3, arterial blood gas (G3+)  Result Value Ref Range   pH, Arterial 7.278 (L) 7.350 - 7.450   pCO2 arterial 51.4 (H) 32.0 - 48.0 mmHg   pO2, Arterial 123.0 (H) 83.0 - 108.0 mmHg   Bicarbonate 24.2 20.0 - 28.0 mmol/L  TCO2 26 22 - 32 mmol/L   O2 Saturation 98.0 %   Acid-base deficit 3.0 (H) 0.0 - 2.0 mmol/L   Patient temperature 36.6 C    Sample type ARTERIAL   I-STAT, chem 8  Result Value Ref Range   Sodium 141 135 - 145 mmol/L   Potassium 4.5 3.5 - 5.1 mmol/L   Chloride 106 98 - 111 mmol/L   BUN 5 (L) 8 - 23 mg/dL   Creatinine, Ser 0.60 (L) 0.61 - 1.24 mg/dL   Glucose, Bld 118 (H) 70 - 99 mg/dL   Calcium, Ion 1.11 (L) 1.15 - 1.40 mmol/L   TCO2 24 22 - 32 mmol/L   Hemoglobin 8.5 (L) 13.0 - 17.0 g/dL   HCT 25.0 (L) 39.0 - 52.0 %  I-STAT 3, arterial blood gas (G3+)  Result Value Ref Range   pH, Arterial 7.332 (L) 7.350 - 7.450   pCO2 arterial 45.7 32.0 - 48.0 mmHg   pO2, Arterial 117.0 (H) 83.0 - 108.0 mmHg   Bicarbonate 24.0 20.0 - 28.0 mmol/L   TCO2 25 22 - 32 mmol/L   O2 Saturation 98.0 %   Acid-base deficit 2.0 0.0 - 2.0 mmol/L   Patient temperature 37.7 C    Collection site ARTERIAL LINE    Drawn by Nurse    Sample type ARTERIAL   I-STAT, chem 8  Result Value Ref Range   Sodium 138 135 - 145 mmol/L   Potassium 4.1 3.5 - 5.1 mmol/L   Chloride 102 98 - 111 mmol/L   BUN 7  (L) 8 - 23 mg/dL   Creatinine, Ser 0.70 0.61 - 1.24 mg/dL   Glucose, Bld 135 (H) 70 - 99 mg/dL   Calcium, Ion 1.14 (L) 1.15 - 1.40 mmol/L   TCO2 24 22 - 32 mmol/L   Hemoglobin 9.9 (L) 13.0 - 17.0 g/dL   HCT 29.0 (L) 39.0 - 52.0 %  I-STAT 4, (NA,K, GLUC, HGB,HCT)  Result Value Ref Range   Sodium 141 135 - 145 mmol/L   Potassium 3.9 3.5 - 5.1 mmol/L   Glucose, Bld 142 (H) 70 - 99 mg/dL   HCT 30.0 (L) 39.0 - 52.0 %   Hemoglobin 10.2 (L) 13.0 - 17.0 g/dL  ECHOCARDIOGRAM COMPLETE  Result Value Ref Range   Weight 4,352 oz   Height 71 in   BP 137/78 mmHg  ECHO INTRAOPERATIVE TEE  Result Value Ref Range   Weight 4,494.4 oz   Height 71 in   BP 142/83 mmHg  Type and screen [place]  Result Value Ref Range   ABO/RH(D) O POS    Antibody Screen NEG    Sample Expiration      04/28/2018 Performed at [facility], [address] [place], [place] [address]   Pulmonary function test  Result Value Ref Range   FVC-Pre 3.84 L   FVC-%Pred-Pre 81 %   FVC-Post 3.84 L   FVC-%Pred-Post 81 %   FVC-%Change-Post 0 %   FEV1-Pre 2.84 L   FEV1-%Pred-Pre 81 %   FEV1-Post 2.93 L   FEV1-%Pred-Post 84 %   FEV1-%Change-Post 3 %   FEV6-Pre 3.84 L   FEV6-%Pred-Pre 86 %   FEV6-Post 3.84 L   FEV6-%Pred-Post 86 %   FEV6-%Change-Post 0 %   Pre FEV1/FVC ratio 74 %   FEV1FVC-%Pred-Pre 99 %   Post FEV1/FVC ratio 76 %   FEV1FVC-%Change-Post 3 %   Pre FEV6/FVC Ratio 100 %   FEV6FVC-%Pred-Pre 105 %  Post FEV6/FVC ratio 100 %   FEV6FVC-%Pred-Post 105 %   FEV6FVC-%Change-Post 0 %   FEF 25-75 Pre 2.22 L/sec   FEF2575-%Pred-Pre 82 %   FEF 25-75 Post 2.46 L/sec   FEF2575-%Pred-Post 90 %   FEF2575-%Change-Post 10 %   RV 2.87 L   RV % pred 117 %   TLC 6.48 L   TLC % pred 89 %   DLCO unc 26.16 ml/min/mmHg   DLCO unc % pred 77 %   DLCO cor 28.18 ml/min/mmHg   DLCO cor % pred 83 %   DL/VA 5.20 ml/min/mmHg/L   DL/VA % pred 111 %    DASH Eating Plan DASH stands for "Dietary  Approaches to Stop Hypertension." The DASH eating plan is a healthy eating plan that has been shown to reduce high blood pressure (hypertension). It may also reduce your risk for type 2 diabetes, heart disease, and stroke. The DASH eating plan may also help with weight loss. What are tips for following this plan? General guidelines  Avoid eating more than 2,300 mg (milligrams) of salt (sodium) a day. If you have hypertension, you may need to reduce your sodium intake to 1,500 mg a day.  Limit alcohol intake to no more than 1 drink a day for nonpregnant women and 2 drinks a day for men. One drink equals 12 oz of beer, 5 oz of wine, or 1 oz of hard liquor.  Work with your health care provider to maintain a healthy body weight or to lose weight. Ask what an ideal weight is for you.  Get at least 30 minutes of exercise that causes your heart to beat faster (aerobic exercise) most days of the week. Activities may include walking, swimming, or biking.  Work with your health care provider or diet and nutrition specialist (dietitian) to adjust your eating plan to your individual calorie needs. Reading food labels  Check food labels for the amount of sodium per serving. Choose foods with less than 5 percent of the Daily Value of sodium. Generally, foods with less than 300 mg of sodium per serving fit into this eating plan.  To find whole grains, look for the word "whole" as the first word in the ingredient list. Shopping  Buy products labeled as "low-sodium" or "no salt added."  Buy fresh foods. Avoid canned foods and premade or frozen meals. Cooking  Avoid adding salt when cooking. Use salt-free seasonings or herbs instead of table salt or sea salt. Check with your health care provider or pharmacist before using salt substitutes.  Do not fry foods. Cook foods using healthy methods such as baking, boiling, grilling, and broiling instead.  Cook with heart-healthy oils, such as olive, canola,  soybean, or sunflower oil. Meal planning   Eat a balanced diet that includes: ? 5 or more servings of fruits and vegetables each day. At each meal, try to fill half of your plate with fruits and vegetables. ? Up to 6-8 servings of whole grains each day. ? Less than 6 oz of lean meat, poultry, or fish each day. A 3-oz serving of meat is about the same size as a deck of cards. One egg equals 1 oz. ? 2 servings of low-fat dairy each day. ? A serving of nuts, seeds, or beans 5 times each week. ? Heart-healthy fats. Healthy fats called Omega-3 fatty acids are found in foods such as flaxseeds and coldwater fish, like sardines, salmon, and mackerel.  Limit how much you eat of the following: ?  Canned or prepackaged foods. ? Food that is high in trans fat, such as fried foods. ? Food that is high in saturated fat, such as fatty meat. ? Sweets, desserts, sugary drinks, and other foods with added sugar. ? Full-fat dairy products.  Do not salt foods before eating.  Try to eat at least 2 vegetarian meals each week.  Eat more home-cooked food and less restaurant, buffet, and fast food.  When eating at a restaurant, ask that your food be prepared with less salt or no salt, if possible. What foods are recommended? The items listed may not be a complete list. Talk with your dietitian about what dietary choices are best for you. Grains Whole-grain or whole-wheat bread. Whole-grain or whole-wheat pasta. Brown rice. [place]. Bulgur. Whole-grain and low-sodium cereals. Pita bread. Low-fat, low-sodium crackers. Whole-wheat flour tortillas. Vegetables Fresh or frozen vegetables (raw, steamed, roasted, or grilled). Low-sodium or reduced-sodium tomato and vegetable juice. Low-sodium or reduced-sodium tomato sauce and tomato paste. Low-sodium or reduced-sodium canned vegetables. Fruits All fresh, dried, or frozen fruit. Canned fruit in natural juice (without added sugar). Meat and other protein  foods Skinless chicken or [place]. Ground chicken or [place]. Pork with fat trimmed off. Fish and seafood. Egg whites. Dried beans, peas, or lentils. Unsalted nuts, nut butters, and seeds. Unsalted canned beans. Lean cuts of beef with fat trimmed off. Low-sodium, lean deli meat. Dairy Low-fat (1%) or fat-free (skim) milk. Fat-free, low-fat, or reduced-fat cheeses. Nonfat, low-sodium ricotta or cottage cheese. Low-fat or nonfat yogurt. Low-fat, low-sodium cheese. Fats and oils Soft margarine without trans fats. Vegetable oil. Low-fat, reduced-fat, or light mayonnaise and salad dressings (reduced-sodium). Canola, safflower, olive, soybean, and sunflower oils. Avocado. Seasoning and other foods Herbs. Spices. Seasoning mixes without salt. Unsalted popcorn and pretzels. Fat-free sweets. What foods are not recommended? The items listed may not be a complete list. Talk with your dietitian about what dietary choices are best for you. Grains Baked goods made with fat, such as croissants, muffins, or some breads. Dry pasta or rice meal packs. Vegetables Creamed or fried vegetables. Vegetables in a cheese sauce. Regular canned vegetables (not low-sodium or reduced-sodium). Regular canned tomato sauce and paste (not low-sodium or reduced-sodium). Regular tomato and vegetable juice (not low-sodium or reduced-sodium). Angie Fava. Olives. Fruits Canned fruit in a light or heavy syrup. Fried fruit. Fruit in cream or butter sauce. Meat and other protein foods Fatty cuts of meat. Ribs. Fried meat. Berniece Salines. Sausage. Bologna and other processed lunch meats. Salami. Fatback. Hotdogs. Bratwurst. Salted nuts and seeds. Canned beans with added salt. Canned or smoked fish. Whole eggs or egg yolks. Chicken or [place] with skin. Dairy Whole or 2% milk, cream, and half-and-half. Whole or full-fat cream cheese. Whole-fat or sweetened yogurt. Full-fat cheese. Nondairy creamers. Whipped toppings. Processed cheese and cheese  spreads. Fats and oils Butter. Stick margarine. Lard. Shortening. Ghee. Bacon fat. Tropical oils, such as coconut, palm kernel, or palm oil. Seasoning and other foods Salted popcorn and pretzels. Onion salt, garlic salt, seasoned salt, table salt, and sea salt. Worcestershire sauce. Tartar sauce. Barbecue sauce. Teriyaki sauce. Soy sauce, including reduced-sodium. Steak sauce. Canned and packaged gravies. Fish sauce. Oyster sauce. Cocktail sauce. Horseradish that you find on the shelf. Ketchup. Mustard. Meat flavorings and tenderizers. Bouillon cubes. Hot sauce and Tabasco sauce. Premade or packaged marinades. Premade or packaged taco seasonings. Relishes. Regular salad dressings. Where to find more information:  National Heart, Lung, and [place]: https://wilson-eaton.com/  American Heart Association: www.heart.org Summary  The DASH eating plan is a healthy eating plan that has been shown to reduce high blood pressure (hypertension). It may also reduce your risk for type 2 diabetes, heart disease, and stroke.  With the DASH eating plan, you should limit salt (sodium) intake to 2,300 mg a day. If you have hypertension, you may need to reduce your sodium intake to 1,500 mg a day.  When on the DASH eating plan, aim to eat more fresh fruits and vegetables, whole grains, lean proteins, low-fat dairy, and heart-healthy fats.  Work with your health care provider or diet and nutrition specialist (dietitian) to adjust your eating plan to your individual calorie needs. This information is not intended to replace advice given to you by your health care provider. Make sure you discuss any questions you have with your health care provider. Document Released: 10/06/2011 Document Revised: 10/10/2016 Document Reviewed: 10/10/2016 [employer] Interactive Patient Education  [employer].

## 2018-05-28 ENCOUNTER — Other Ambulatory Visit: Payer: Self-pay

## 2018-05-28 ENCOUNTER — Ambulatory Visit (INDEPENDENT_AMBULATORY_CARE_PROVIDER_SITE_OTHER): Payer: Self-pay | Admitting: Physician Assistant

## 2018-05-28 ENCOUNTER — Ambulatory Visit
Admission: RE | Admit: 2018-05-28 | Discharge: 2018-05-28 | Disposition: A | Discharge status: Home / Self Care | Payer: Medicare Other | Source: Ambulatory Visit | Attending: Thoracic Surgery (Cardiothoracic Vascular Surgery) | Admitting: Thoracic Surgery (Cardiothoracic Vascular Surgery)

## 2018-05-28 VITALS — BP 147/75 | HR 64 | Resp 18 | Ht 71.0 in | Wt 267.8 lb

## 2018-05-28 DIAGNOSIS — J90 Pleural effusion, not elsewhere classified: Secondary | ICD-10-CM | POA: Diagnosis not present

## 2018-05-28 DIAGNOSIS — Z951 Presence of aortocoronary bypass graft: Secondary | ICD-10-CM

## 2018-05-28 MED ORDER — LISINOPRIL 20 MG PO TABS: 20.0000 mg | ORAL_TABLET | Freq: Every day | ORAL | 1 refills | Status: DC

## 2018-05-28 MED ORDER — POTASSIUM CHLORIDE CRYS ER 10 MEQ PO TBCR: 10.0000 meq | EXTENDED_RELEASE_TABLET | Freq: Every day | ORAL | 1 refills | Status: DC

## 2018-05-28 NOTE — Addendum Note (Signed)
Addended by: Charlena Cross F on: 05/28/2018 03:30 PM   Modules accepted: Orders

## 2018-05-28 NOTE — Patient Instructions (Addendum)
  You are encouraged to enroll and participate in the outpatient cardiac rehab program beginning as soon as practical.  Continue to avoid any heavy lifting or strenuous use of your arms or shoulders for at least a total of three months from the time of surgery.  After three months you may gradually increase how much you lift or otherwise use your arms or chest as tolerated, with limits based upon whether or not activities lead to the return of significant discomfort.

## 2018-05-28 NOTE — Progress Notes (Signed)
HPI:  Patient returns for routine postoperative follow-up having undergone a CABGx 3 on 04/27/2018 by Dr. Roxy Manns. The patient's early postoperative recovery while in the hospital was notable for PVCs. Patient denies chest pain, shortness of breath, LE edema, or palpitations.   Current Outpatient Medications  Medication Sig Dispense Refill  . amiodarone (PACERONE) 200 MG tablet Take 1 tablet (200 mg total) by mouth daily. 90 tablet 3  . aspirin EC 81 MG EC tablet Take 1 tablet (81 mg total) by mouth daily.    . citalopram (CELEXA) 20 MG tablet Take 20 mg by mouth daily.    . fentaNYL (DURAGESIC - DOSED MCG/HR) 25 MCG/HR patch APP 1 PA EXT TO THE SKIN Q 72 H  0  . levothyroxine (SYNTHROID, LEVOTHROID) 150 MCG tablet Take 1 tablet (150 mcg total) by mouth daily before breakfast. 30 tablet 1  . lisinopril (PRINIVIL,ZESTRIL) 10 MG tablet Take 1 tablet (10 mg total) by mouth daily. 30 tablet 1  . metoprolol tartrate (LOPRESSOR) 50 MG tablet TAKE 1 TABLET BY MOUTH TWICE DAILY. (Patient taking differently: TAKE 1 TABLET(50mg ) BY MOUTH TWICE DAILY.) 60 tablet 5  . Multiple Vitamin (MULTIVITAMIN) capsule Take 1 capsule by mouth daily.      . pantoprazole (PROTONIX) 40 MG tablet Take 40 mg by mouth daily.    . potassium chloride SA (K-DUR,KLOR-CON) 20 MEQ tablet Take 1 tablet (20 mEq total) by mouth daily. 7 tablet 0  . rosuvastatin (CRESTOR) 40 MG tablet Take one po Q evening. 90 tablet 1  . tamsulosin (FLOMAX) 0.4 MG CAPS capsule Take 0.4 mg by mouth.    . torsemide (DEMADEX) 20 MG tablet Take 2 tablets (40 mg total) by mouth daily. 14 tablet 0  Vital Signs: BP 147/75, HR 64, RR 18, Oxygenation 97% on room air   Physical Exam: CV-RRR Pulmonary-Clear to auscultation bilaterally Extremities-Trace LE edema Wounds-Clean,dry, well healed, no sign of infection  Diagnostic Tests: CLINICAL DATA:  Post CABG  EXAM: CHEST - 2 VIEW  COMPARISON:  04/30/2018  FINDINGS: Enlargement of cardiac  silhouette post CABG and coronary stenting.  Mediastinal contours and pulmonary vascularity normal.  Azygos fissure noted.  Improved bibasilar aeration with minimal residual LEFT pleural effusion.  No acute infiltrate, RIGHT pleural effusion, or pneumothorax.  Prior cervical spine fusion.  Bones demineralized.  IMPRESSION: Enlargement of cardiac silhouette post CABG and coronary stenting.  Small LEFT pleural effusion.   Electronically Signed   By: Lavonia Dana M.D.   On: 05/28/2018 14:27  Impression and Plan: Overall, Mr. Mapps is recovering well from coronary artery bypass grafting surgery. He has already seen the physician assistant from cardiology on 05/23/2018. Changes to his medication include decreasing Amiodarone to 200 mg daily. He has also seen Dr. Wolfgang Phoenix his medical doctor in follow up on 05/25/2018. Atorvastatin was stopped and he was started on Crestor 40 mg daily. Also, Doxazosin was stopped as he was having ortho stasis. His blood pressure last week was in the low 149'F;WYOVZCH, his systolic blood pressure is now over 140 .I have increased his Lisinopril to 20 mg daily and if he has any dizziness or decreased blood pressure, he is to call and I will decrease accordingly. He requested a refill for potassium, which I gave at a lower dose until he has his labs checked. There were no other changes made to his medications. He does not drive as is on Fentanyl patch for chronic back pain. He was encouraged to participate in cardiac rehab, which  he is agreeable. I will ask nurse to refer him to [facility] as he lives in [place]. He was instructed no lifting more than 10 pounds for the next 4 weeks. He will return to see Dr. Roxy Manns in about 5-6 weeks.  Nani Skillern, PA-C [facility] [phone]

## 2018-05-31 ENCOUNTER — Other Ambulatory Visit: Payer: Self-pay | Admitting: Surgical

## 2018-05-31 ENCOUNTER — Telehealth: Payer: Self-pay | Admitting: *Deleted

## 2018-05-31 NOTE — Telephone Encounter (Signed)
Called patient to check on his b/p results since he was last here on Monday 7/29.  Donielle, PA-C adjusted his lisinopril and patient said he was doing great and b/p was staying stable.  I told him to call back if he notices any changes or drop in pressure.  He understands.  I have let Donielle know about our conversation.  Patient was very [employer].

## 2018-06-07 DIAGNOSIS — Z79899 Other long term (current) drug therapy: Secondary | ICD-10-CM | POA: Diagnosis not present

## 2018-06-07 DIAGNOSIS — R5383 Other fatigue: Secondary | ICD-10-CM | POA: Diagnosis not present

## 2018-06-07 DIAGNOSIS — E7849 Other hyperlipidemia: Secondary | ICD-10-CM | POA: Diagnosis not present

## 2018-06-08 LAB — HEPATIC FUNCTION PANEL
ALK PHOS: 122 IU/L — AB (ref 39–117)
ALT: 13 IU/L (ref 0–44)
AST: 16 IU/L (ref 0–40)
Albumin: 4.5 g/dL (ref 3.6–4.8)
BILIRUBIN, DIRECT: 0.11 mg/dL (ref 0.00–0.40)
Bilirubin Total: 0.3 mg/dL (ref 0.0–1.2)
TOTAL PROTEIN: 6.5 g/dL (ref 6.0–8.5)

## 2018-06-08 LAB — BASIC METABOLIC PANEL
BUN/Creatinine Ratio: 9 — ABNORMAL LOW (ref 10–24)
BUN: 12 mg/dL (ref 8–27)
CALCIUM: 9 mg/dL (ref 8.6–10.2)
CO2: 22 mmol/L (ref 20–29)
Chloride: 101 mmol/L (ref 96–106)
Creatinine, Ser: 1.41 mg/dL — ABNORMAL HIGH (ref 0.76–1.27)
GFR calc Af Amer: 59 mL/min/{1.73_m2} — ABNORMAL LOW (ref >59)
GFR, EST NON AFRICAN AMERICAN: 51 mL/min/{1.73_m2} — AB (ref >59)
Glucose: 98 mg/dL (ref 65–99)
POTASSIUM: 4.4 mmol/L (ref 3.5–5.2)
Sodium: 142 mmol/L (ref 134–144)

## 2018-06-08 LAB — LIPID PANEL
CHOLESTEROL TOTAL: 139 mg/dL (ref 100–199)
Chol/HDL Ratio: 4 ratio (ref 0.0–5.0)
HDL: 35 mg/dL — ABNORMAL LOW (ref >39)
LDL Calculated: 79 mg/dL (ref 0–99)
Triglycerides: 127 mg/dL (ref 0–149)
VLDL Cholesterol Cal: 25 mg/dL (ref 5–40)

## 2018-06-08 LAB — TSH: TSH: 2.34 u[IU]/mL (ref 0.450–4.500)

## 2018-06-11 ENCOUNTER — Encounter: Payer: Self-pay | Admitting: Family Medicine

## 2018-06-11 ENCOUNTER — Other Ambulatory Visit: Payer: Self-pay

## 2018-06-11 ENCOUNTER — Ambulatory Visit: Payer: Medicare Other | Admitting: Family Medicine

## 2018-06-11 VITALS — BP 134/66 | Ht 71.0 in | Wt 262.2 lb

## 2018-06-11 DIAGNOSIS — I10 Essential (primary) hypertension: Secondary | ICD-10-CM

## 2018-06-11 NOTE — Progress Notes (Signed)
Ordered

## 2018-06-11 NOTE — Progress Notes (Addendum)
   Subjective:    Patient ID: [name], male    DOB: 04-11-1951, 67 y.o.   MRN: [id number]  Hypertension  This is a chronic problem. Pertinent negatives include no chest pain, headaches or shortness of breath.   BP has been real low. Cardiologist increased one of patients medications.  Patient's blood pressures been on the low end felt somewhat woozy but denies any dizziness denies any passing out denies any chest tightness pressure pain shortness of breath recently saw cardiology who increased his lisinopril to 20 mg he has not taken his blood pressure medicine today has a history of blood pressure  Review of Systems  Constitutional: Negative for diaphoresis and fatigue.  HENT: Negative for congestion and rhinorrhea.   Respiratory: Negative for cough and shortness of breath.   Cardiovascular: Negative for chest pain and leg swelling.  Gastrointestinal: Negative for abdominal pain and diarrhea.  Skin: Negative for color change and rash.  Neurological: Negative for dizziness and headaches.  Psychiatric/Behavioral: Negative for behavioral problems and confusion.       Objective:   Physical Exam  Constitutional: He appears well-nourished. No distress.  HENT:  Head: Normocephalic and atraumatic.  Eyes: Right eye exhibits no discharge. Left eye exhibits no discharge.  Neck: No tracheal deviation present.  Cardiovascular: Normal rate, regular rhythm and normal heart sounds.  No murmur heard. Pulmonary/Chest: Effort normal and breath sounds normal. No respiratory distress. He has no wheezes.  Musculoskeletal: He exhibits no edema.  Lymphadenopathy:    He has no cervical adenopathy.  Neurological: He is alert.  Skin: Skin is warm. No rash noted.  Psychiatric: His behavior is normal.  Vitals reviewed.   Blood pressure was checked laying sitting standing with some drop  15 minutes was spent with patient today discussing healthcare issues which they came.  More than 50% of this  visit-total duration of visit-was spent in counseling and coordination of care.  Please see diagnosis regarding the focus of this coordination and care Mild renal insufficiency recheck blood work in approximately [redacted] week along with follow-up    Assessment & Plan:  Blood pressure slightly lower than what I would expect for him not taking his medicine he will check his blood pressure on a regular basis he will drop by on Wednesday for [place] to recheck blood pressure continue current medications as is for now  Weds- BP was low 110/60 sitting 88/58 was dizzy Therefore reduce lisinopril to 10 mg Will notify cardiothor

## 2018-06-13 ENCOUNTER — Other Ambulatory Visit: Payer: Self-pay

## 2018-06-13 MED ORDER — LISINOPRIL 10 MG PO TABS: 10.0000 mg | ORAL_TABLET | Freq: Every day | ORAL | 5 refills | Status: DC

## 2018-06-13 NOTE — Addendum Note (Signed)
Addended by: Sallee Lange A on: 06/13/2018 09:41 AM   Modules accepted: Orders

## 2018-06-15 ENCOUNTER — Other Ambulatory Visit: Payer: Self-pay

## 2018-06-15 ENCOUNTER — Other Ambulatory Visit: Payer: Self-pay | Admitting: Family Medicine

## 2018-06-15 MED ORDER — TORSEMIDE 20 MG PO TABS: 40.0000 mg | ORAL_TABLET | Freq: Every day | ORAL | 1 refills | Status: DC

## 2018-07-03 ENCOUNTER — Other Ambulatory Visit: Payer: Self-pay | Admitting: Surgical

## 2018-07-03 ENCOUNTER — Encounter: Payer: Self-pay | Admitting: Cardiovascular Disease

## 2018-07-09 ENCOUNTER — Ambulatory Visit (INDEPENDENT_AMBULATORY_CARE_PROVIDER_SITE_OTHER): Payer: Self-pay | Admitting: Thoracic Surgery (Cardiothoracic Vascular Surgery)

## 2018-07-09 ENCOUNTER — Encounter: Payer: Self-pay | Admitting: Thoracic Surgery (Cardiothoracic Vascular Surgery)

## 2018-07-09 VITALS — BP 110/54 | HR 54 | Resp 20 | Ht 71.0 in | Wt 263.0 lb

## 2018-07-09 DIAGNOSIS — Z951 Presence of aortocoronary bypass graft: Secondary | ICD-10-CM

## 2018-07-09 DIAGNOSIS — I2510 Atherosclerotic heart disease of native coronary artery without angina pectoris: Secondary | ICD-10-CM

## 2018-07-09 NOTE — Patient Instructions (Addendum)
Stop taking amiodarone.  Continue all other previous medications without any changes at this time  You may resume unrestricted physical activity without any particular limitations at this time.  Make every effort to stay physically active, get some type of exercise on a regular basis, and stick to a "heart healthy diet".  The long term benefits for regular exercise and a healthy diet are critically important to your overall health and wellbeing.

## 2018-07-09 NOTE — Progress Notes (Signed)
[address]       [place] [address]             [phone]     CARDIOTHORACIC SURGERY OFFICE NOTE  Referring Provider is Josue Hector, MD PCP is Kathyrn Drown, MD   HPI:  Patient is a 67 year old obese white male with long-standing history of coronary artery disease, hypertension, hyperlipidemia, ITP, and chronic back pain with limited physical mobility who returns to the office today for routine follow-up status post coronary artery bypass grafting x3 on April 27, 2018 for severe three-vessel coronary artery disease with mild to moderate left ventricular systolic dysfunction and symptoms of exertional chest discomfort and shortness of breath.  The patient's early postoperative recovery was notable for the development of a brief episode of postoperative atrial fibrillation which quickly resolved with a dose of intravenous amiodarone.  TSH level was elevated and his thyroid hormone dose was adjusted.  He was discharged home on the sixth postoperative day on oral amiodarone.  He was seen in follow-up at [facility] on May 23, 2018 and amiodarone was .  He was last seen in follow-up in our office on May 28, 2018 at which time he was doing well.  He returns to our office today and reports that he is doing exceptionally well.  He never heard from the cardiac rehab team although he was interested in participating in the cardiac rehab program.  He has been walking on his own and doing remarkably well.  He has also lost approximately 50 pounds in weight simply by adjusting his diet.  He states that he quit drinking soft drinks and other sugar containing beverages.  He quit eating potato chips in bed.  He feels much better than he has in quite some time.  He still has mild soreness on the sides of his chest but this continues to improve.  He gets short of breath with more strenuous exertion but overall he states that his breathing is much better than it used to be.  He is  delighted with his progress.  He has not had any exertional chest pain or chest tightness.   Current Outpatient Medications  Medication Sig Dispense Refill  . amiodarone (PACERONE) 200 MG tablet Take 1 tablet (200 mg total) by mouth daily. 90 tablet 3  . aspirin EC 81 MG EC tablet Take 1 tablet (81 mg total) by mouth daily.    . citalopram (CELEXA) 20 MG tablet Take 20 mg by mouth daily.    . fentaNYL (DURAGESIC - DOSED MCG/HR) 25 MCG/HR patch APP 1 PA EXT TO THE SKIN Q 72 H  0  . levothyroxine (SYNTHROID, LEVOTHROID) 150 MCG tablet Take 1 tablet (150 mcg total) by mouth daily before breakfast. 30 tablet 1  . lisinopril (PRINIVIL,ZESTRIL) 10 MG tablet Take 1 tablet (10 mg total) by mouth daily. 30 tablet 5  . metoprolol tartrate (LOPRESSOR) 50 MG tablet TAKE 1 TABLET BY MOUTH TWICE DAILY. (Patient taking differently: TAKE 1 TABLET(50mg ) BY MOUTH TWICE DAILY.) 60 tablet 5  . Multiple Vitamin (MULTIVITAMIN) capsule Take 1 capsule by mouth daily.      . pantoprazole (PROTONIX) 40 MG tablet Take 40 mg by mouth daily.    . potassium chloride SA (K-DUR,KLOR-CON) 10 MEQ tablet Take 1 tablet (10 mEq total) by mouth daily. 30 tablet 1  . rosuvastatin (CRESTOR) 40 MG tablet Take one po Q evening. 90 tablet 1  . tamsulosin (FLOMAX) 0.4  MG CAPS capsule Take 0.4 mg by mouth.    . torsemide (DEMADEX) 20 MG tablet Take 2 tablets (40 mg total) by mouth daily. 180 tablet 1   No current facility-administered medications for this visit.       Physical Exam:   BP (!) 110/54   Pulse (!) 54   Resp 20   Ht 5\' 11"  (1.803 m)   Wt 263 lb (119.3 kg)   SpO2 98% Comment: RA  BMI 36.68 kg/m   General:  Well-appearing  Chest:   Clear to auscultation  CV:   Regular rate and rhythm  Incisions:  Well-healed, sternum is stable  Abdomen:  Soft nontender  Extremities:  Warm and well-perfused with slight lower extremity edema  Diagnostic Tests:  n/a   Impression:  Patient is doing quite well approximately  2-1/2 months status post coronary artery bypass grafting  Plan:  I have encouraged the patient to continue to gradually increase his physical activity without any particular limitations at this time.  I have instructed him to stop taking amiodarone.  He will otherwise continue on all the remaining medications without change.  All of his questions have been addressed.  The patient will continue to follow-up intermittently with Dr. Johnsie Cancel.  He will return to our office for routine follow-up next June, approximately 1 year following his surgery.  He will otherwise call and return sooner only should specific problems or questions arise.    [name]. Oscar Manns, MD 07/09/2018 4:24 PM

## 2018-07-10 ENCOUNTER — Encounter: Payer: Self-pay | Admitting: Family Medicine

## 2018-07-10 ENCOUNTER — Other Ambulatory Visit: Payer: Self-pay | Admitting: Surgical

## 2018-07-11 ENCOUNTER — Other Ambulatory Visit: Payer: Self-pay | Admitting: Family Medicine

## 2018-07-11 DIAGNOSIS — M62838 Other muscle spasm: Secondary | ICD-10-CM | POA: Diagnosis not present

## 2018-07-11 DIAGNOSIS — M961 Postlaminectomy syndrome, not elsewhere classified: Secondary | ICD-10-CM | POA: Diagnosis not present

## 2018-07-11 DIAGNOSIS — G894 Chronic pain syndrome: Secondary | ICD-10-CM | POA: Diagnosis not present

## 2018-07-11 DIAGNOSIS — M4726 Other spondylosis with radiculopathy, lumbar region: Secondary | ICD-10-CM | POA: Diagnosis not present

## 2018-07-11 DIAGNOSIS — Z79891 Long term (current) use of opiate analgesic: Secondary | ICD-10-CM | POA: Diagnosis not present

## 2018-07-11 DIAGNOSIS — M5412 Radiculopathy, cervical region: Secondary | ICD-10-CM | POA: Diagnosis not present

## 2018-07-11 MED ORDER — LEVOTHYROXINE SODIUM 150 MCG PO TABS: 150.0000 ug | ORAL_TABLET | Freq: Every day | ORAL | 1 refills | Status: DC

## 2018-07-11 NOTE — Telephone Encounter (Signed)
Pt last seen in August. Med was prescribed by  Jadene Pierini, PA-C. Can you order for him

## 2018-08-06 ENCOUNTER — Other Ambulatory Visit: Payer: Self-pay | Admitting: Physician Assistant

## 2018-08-16 ENCOUNTER — Ambulatory Visit: Payer: Medicare Other | Admitting: Cardiovascular Disease

## 2018-08-23 ENCOUNTER — Other Ambulatory Visit: Payer: Self-pay | Admitting: Physician Assistant

## 2018-09-07 ENCOUNTER — Other Ambulatory Visit: Payer: Self-pay | Admitting: Family Medicine

## 2018-09-19 NOTE — Progress Notes (Signed)
Cardiology Office Note    Date:  09/24/2018   ID:  [name], DOB 14-Jul-1951, MRN [id number]  PCP:  Oscar Drown, Burns  Cardiologist: Oscar Rouge, Burns    No chief complaint on file.   History of Present Illness:    [name] is a 67 y.o. male with past medical history of CAD (s/p prior stenting of LCx and RCA in 2003, stenting of OM in 2012), HTN, HLD, chronic ITP and Hypothyroidism who presents to the office today post CABG   Presented end of June 2019 with progressive angina  Cath arranged  This was performed by [name] on 04/24/2018 and showed severe three-vessel disease with occlusion of previously placed stents along the RCA and LCx and 75% LAD stenosis. CT surgery was consulted for consideration of CABG. He underwent CABG by Dr. Roxy Burns on 04/27/2018 with LIMA-LAD, SVG-OM1, and SVG-OM2. He did develop postoperative atrial fibrillation and converted back to normal sinus rhythm with initiation of IV Amiodarone. TSH was elevated to 9.090 and Synthroid dosing was appropriately adjusted. Platelet count remained stable and gradually trended upwards to 141 K on 05/01/2018.  He was discharged home on 05/03/2018 on ASA 81mg  daily, Amiodarone 200mg  BID, Atorvastatin 80mg  daily, Lisinopril 10mg  daily, Lopressor 50mg  BID, and Torsemide 40mg  daily.   Lost 50 lbs cutting out soda Active has some pain in chest but sounds muscular Feels much better more stamina   Past Medical History:  Diagnosis Date  . Allergy   . Carpal tunnel syndrome   . Chronic back pain   . Chronic combined systolic and diastolic congestive heart failure ([place])   . Chronic ITP (idiopathic thrombocytopenia) (H[facility]) 05/25/2015  . Coronary artery disease   . Coronary artery disease involving native coronary artery of native heart with angina pectoris ([place])   . Degenerative disc disease   . GERD (gastroesophageal reflux disease)   . History of thrombocytopenia   . Hypercholesteremia   . Hypertension   . Hypothyroid     . Lumbar pain   . Myocardial infarction ([place]) 2009  . Obesity   . S/P CABG x 3 04/27/2018   LIMA to LAD SVG to OM1 SVG to OM2  . Shortness of breath dyspnea     Past Surgical History:  Procedure Laterality Date  . BACK SURGERY     5 lumbas disc with cervical and lumbar fusions  . BIOPSY N/A 03/06/2014   Procedure: ESOPHAGEAL BIOPSIES;  Surgeon: [name];  Location: [facility];  Service: Endoscopy;  Laterality: N/A;  . COLONOSCOPY    . COLONOSCOPY WITH PROPOFOL N/A 03/06/2014   Procedure: COLONOSCOPY WITH PROPOFOL;  Surgeon: [name];  Location: [facility];  Service: Endoscopy;  Laterality: N/A;  in cecum at 0807; total withdrawal time 9 minutes  . CORONARY ANGIOPLASTY WITH STENT PLACEMENT     2000, and 2004 has 3 stents  . CORONARY ARTERY BYPASS GRAFT N/A 04/27/2018   Procedure: CORONARY ARTERY BYPASS GRAFTING (CABG) x Three , using left internal mammary artery and right leg greater saphenous vein;  Surgeon: [name];  Location: [facility];  Service: Open Heart Surgery;  Laterality: N/A;  . ESOPHAGOGASTRODUODENOSCOPY (EGD) WITH PROPOFOL N/A 03/06/2014   Procedure: ESOPHAGOGASTRODUODENOSCOPY (EGD) WITH PROPOFOL;  Surgeon: [name];  Location: [facility];  Service: Endoscopy;  Laterality: N/A;  . LEFT HEART CATH AND CORONARY ANGIOGRAPHY N/A 04/24/2018   Procedure: LEFT HEART CATH AND CORONARY ANGIOGRAPHY;  Surgeon: [name],  Oscar Lange, Burns;  Location: [facility];  Service: Cardiovascular;  Laterality: N/A;  . LUMBAR FUSION  2009  . MALONEY DILATION N/A 03/06/2014   Procedure: MALONEY DILATION 54 french;  Surgeon: [name];  Location: [facility];  Service: Endoscopy;  Laterality: N/A;  . NECK SURGERY    . TEE WITHOUT CARDIOVERSION N/A 04/27/2018   Procedure: TRANSESOPHAGEAL ECHOCARDIOGRAM (TEE);  Surgeon: [name];  Location: [facility];  Service: Open Heart Surgery;  Laterality: N/A;    Current Medications: Outpatient Medications Prior to Visit   Medication Sig Dispense Refill  . aspirin EC 81 MG EC tablet Take 1 tablet (81 mg total) by mouth daily.    . citalopram (CELEXA) 20 MG tablet TAKE 1 TABLET BY MOUTH EVERY MORNING. 90 tablet 0  . levothyroxine (SYNTHROID, LEVOTHROID) 150 MCG tablet Take 1 tablet (150 mcg total) by mouth daily before breakfast. 90 tablet 1  . lisinopril (PRINIVIL,ZESTRIL) 10 MG tablet Take 1 tablet (10 mg total) by mouth daily. 30 tablet 5  . metoprolol tartrate (LOPRESSOR) 50 MG tablet TAKE 1 TABLET BY MOUTH TWICE DAILY. (Patient taking differently: TAKE 1 TABLET(50mg ) BY MOUTH TWICE DAILY.) 60 tablet 5  . Multiple Vitamin (MULTIVITAMIN) capsule Take 1 capsule by mouth daily.      . pantoprazole (PROTONIX) 40 MG tablet TAKE 1 TABLET BY MOUTH ONCE A DAY. 90 tablet 0  . potassium chloride SA (K-DUR,KLOR-CON) 10 MEQ tablet Take 1 tablet (10 mEq total) by mouth daily. 30 tablet 1  . rosuvastatin (CRESTOR) 40 MG tablet Take one po Q evening. 90 tablet 1  . tamsulosin (FLOMAX) 0.4 MG CAPS capsule Take 0.4 mg by mouth.    . torsemide (DEMADEX) 20 MG tablet Take 2 tablets (40 mg total) by mouth daily. 180 tablet 1  . fentaNYL (DURAGESIC - DOSED MCG/HR) 25 MCG/HR patch APP 1 PA EXT TO THE SKIN Q 72 H  0   No facility-administered medications prior to visit.      Allergies:   Penicillins; Sulfa antibiotics; and Zoloft [sertraline hcl]   Social History   Socioeconomic History  . Marital status: Married    Spouse name: Not on file  . Number of children: Not on file  . Years of education: Not on file  . Highest education level: Not on file  Occupational History  . Not on file  Social Needs  . Financial resource strain: Not on file  . Food insecurity:    Worry: Not on file    Inability: Not on file  . Transportation needs:    Medical: Not on file    Non-medical: Not on file  Tobacco Use  . Smoking status: Former Smoker    Packs/day: 2.00    Years: 20.00    Pack years: 40.00    Types: Cigarettes    Last  attempt to quit: 07/06/1991    Years since quitting: 27.2  . Smokeless tobacco: Never Used  Substance and Sexual Activity  . Alcohol use: No    Comment: occassional  . Drug use: No  . Sexual activity: Not Currently    Birth control/protection: None  Lifestyle  . Physical activity:    Days per week: Not on file    Minutes per session: Not on file  . Stress: Not on file  Relationships  . Social connections:    Talks on phone: Not on file    Gets together: Not on file    Attends religious service:  Not on file    Active member of club or organization: Not on file    Attends meetings of clubs or organizations: Not on file    Relationship status: Not on file  Other Topics Concern  . Not on file  Social History Narrative   Disabled due to chronic back pain     Family History:  The patient's family history includes Heart attack in his father.   Review of Systems:   Please see the history of present illness.     General:  No chills, fever, night sweats or weight changes. Positive for chronic back pain.  Cardiovascular:  No dyspnea on exertion, edema, orthopnea, palpitations, paroxysmal nocturnal dyspnea. Positive for sternal chest pain (along incision).  Dermatological: No rash, lesions/masses Respiratory: No cough, dyspnea Urologic: No hematuria, dysuria Abdominal:   No nausea, vomiting, diarrhea, bright red blood per rectum, melena, or hematemesis Neurologic:  No visual changes, wkns, changes in mental status.  All other systems reviewed and are otherwise negative except as noted above.   Physical Exam:    VS:  BP (!) 168/84 (BP Location: Right Arm)   Pulse 84   Ht 6' (1.829 m)   Wt 257 lb (116.6 kg)   SpO2 97%   BMI 34.86 kg/m    Affect appropriate Healthy:  appears stated age HEENT: normal Neck supple with no adenopathy JVP normal no bruits no thyromegaly Lungs clear with no wheezing and good diaphragmatic motion Heart:  S1/S2 no murmur, no rub, gallop or  click PMI normal Abdomen: benighn, BS positve, no tenderness, no AAA no bruit.  No HSM or HJR Distal pulses intact with no bruits No edema Neuro non-focal Skin warm and dry No muscular weakness   Wt Readings from Last 3 Encounters:  09/24/18 257 lb (116.6 kg)  07/09/18 263 lb (119.3 kg)  06/11/18 262 lb 3.2 oz (118.9 kg)     Studies/Labs Reviewed:   EKG:  EKG is ordered today. The ekg ordered today demonstrates NSR, HR 67, with T-wave abnormalities along inferior and lateral leads (similar to prior tracings).   Recent Labs: 04/25/2018: B Natriuretic Peptide 152.8 04/28/2018: Magnesium 2.4 05/23/2018: Hemoglobin 11.0; Platelets 201 06/07/2018: ALT 13; BUN 12; Creatinine, Ser 1.41; Potassium 4.4; Sodium 142; TSH 2.340   Lipid Panel    Component Value Date/Time   CHOL 139 06/07/2018 1400   TRIG 127 06/07/2018 1400   HDL 35 (L) 06/07/2018 1400   CHOLHDL 4.0 06/07/2018 1400   CHOLHDL 5.7 04/25/2018 0358   VLDL 25 04/25/2018 0358   LDLCALC 79 06/07/2018 1400    Additional studies/ records that were reviewed today include:   Cardiac Catheterization: 04/24/2018  Prox RCA lesion is 95% stenosed.  Dist RCA-2 lesion is 100% stenosed.  Dist RCA-1 lesion is 99% stenosed.  Ost 1st Mrg lesion is 100% stenosed.  Lat 1st Mrg lesion is 100% stenosed.  Ost LAD to Prox LAD lesion is 50% stenosed.  Mid LAD-1 lesion is 75% stenosed. This is seen best in teh LAO caudal view.  Mid LAD-2 lesion is 50% stenosed.  There is mild left ventricular systolic dysfunction.  The left ventricular ejection fraction is 45-50% by visual estimate.  LV end diastolic pressure is normal.  There is no aortic valve stenosis.   Severe three vessel disease including occlusion of both prior stented segments.  De novo LAD lesion, beset seen in the LAD caudal view, and clearly worse from prior cath.  Cardiac surgery consult.    Will  admit since he reports pain at rest.  Will hold off on heparin for  now given prior platelet issues.  May need to start if pain occurs.   Echocardiogram: 04/25/2018 Study Conclusions  - Left ventricle: The cavity size was normal. Systolic function was   mildly reduced. The estimated ejection fraction was in the range   of 45% to 50%. Hypokinesis of the inferolateral, anterior and   anteroseptal, and anterolateral myocardium. Doppler parameters   are consistent with abnormal left ventricular relaxation (grade 1   diastolic dysfunction). Doppler parameters are consistent with   high ventricular filling pressure. - Aortic valve: Transvalvular velocity was within the normal range.   There was no stenosis. There was mild regurgitation directed   towards the mitral anterior leaflet. Regurgitation pressure   half-time: 887 ms. - Aorta: Aortic root dimension: 39 mm (ED). - Ascending aorta: The ascending aorta was mildly dilated. - Mitral valve: Transvalvular velocity was within the normal range.   There was no evidence for stenosis. There was trivial   regurgitation. - Left atrium: The atrium was severely dilated. - Right ventricle: The cavity size was normal. Wall thickness was   normal. Systolic function was normal. - Tricuspid valve: There was trivial regurgitation. - Pulmonary arteries: Systolic pressure was within the normal   range.  Assessment:    No diagnosis found.   Plan:   In order of problems listed above:  1. CAD - s/p prior stenting of LCx and RCA in 2003 with stenting of OM in 2012. Recently underwent a cardiac catheterization due to unstable angina and cath showed severe three-vessel disease. He underwent CABG by Dr. Roxy Burns on 04/27/2018 with LIMA-LAD, SVG-OM1, and SVG-OM2.   - Continue ASA (on 81mg  daily due to known ITP), statin, and beta-blocker therapy.  2. Post-Operative Atrial Fibrillation - Occurred following his recent CABG and he converted to normal sinus rhythm  D/c amiodarone at this time    3. HTN - Well controlled.   Continue current medications and low sodium Dash type diet.   - Continue Lisinopril 10 mg daily and Lopressor 50 mg twice daily at this time.  4. HLD - continue high dose statin LDL 79 06/07/18 with normal LFTls   5. ITP - Trough platelet count was 79K on 04/28/2018, improved to 141K at the time of most recent check on 05/01/2018. - He denies any evidence of active bleeding. Will remain on ASA 81 mg daily. Has follow-up with his PCP on Friday and is anticipating repeat labs at that time.   6. Chronic Venous Stasis - No significant edema appreciated on examination today. He remains on Torsemide 40 mg daily. He is now following a low-sodium diet.      Signed, Oscar Rouge, Burns  09/24/2018 9:14 AM    [facility] [address] [place], [place] [address] Phone: [phone]

## 2018-09-24 ENCOUNTER — Encounter: Payer: Self-pay | Admitting: Cardiovascular Disease

## 2018-09-24 ENCOUNTER — Ambulatory Visit: Payer: Medicare Other | Admitting: Cardiovascular Disease

## 2018-09-24 VITALS — BP 168/84 | HR 84 | Ht 72.0 in | Wt 257.0 lb

## 2018-09-24 DIAGNOSIS — I25810 Atherosclerosis of coronary artery bypass graft(s) without angina pectoris: Secondary | ICD-10-CM | POA: Diagnosis not present

## 2018-09-24 NOTE — Patient Instructions (Signed)

## 2018-10-29 ENCOUNTER — Ambulatory Visit (INDEPENDENT_AMBULATORY_CARE_PROVIDER_SITE_OTHER): Payer: Medicare Other | Admitting: Family Medicine

## 2018-10-29 ENCOUNTER — Encounter: Payer: Self-pay | Admitting: Family Medicine

## 2018-10-29 VITALS — BP 132/90 | HR 70 | Temp 98.1°F | Wt 257.8 lb

## 2018-10-29 DIAGNOSIS — J019 Acute sinusitis, unspecified: Secondary | ICD-10-CM

## 2018-10-29 MED ORDER — ALBUTEROL SULFATE HFA 108 (90 BASE) MCG/ACT IN AERS: 2.0000 | INHALATION_SPRAY | Freq: Four times a day (QID) | RESPIRATORY_TRACT | 0 refills | Status: DC | PRN

## 2018-10-29 MED ORDER — DOXYCYCLINE HYCLATE 100 MG PO TABS: 100.0000 mg | ORAL_TABLET | Freq: Two times a day (BID) | ORAL | 0 refills | Status: DC

## 2018-10-29 NOTE — Progress Notes (Signed)
   Subjective:    Patient ID: [name], male    DOB: 08-26-1951, 67 y.o.   MRN: [id number]  Sinusitis  This is a new problem. The current episode started in the past 7 days. The maximum temperature recorded prior to his arrival was 101 - 101.9 F. Associated symptoms include congestion, coughing, ear pain, sinus pressure and a sore throat. Pertinent negatives include no shortness of breath. (Wheezing, runny nose) Treatments tried: Mucinex; Alka Seltzer plus Cold. The treatment provided mild (was doing better during the day but at night, would worsen) relief.   Started with sinus drainage initially - started on 10/23/18, then progressed to cough productive of yellow mucous. Also having sinus pressure, bilateral ear pain, and sore throat. Fever started on 10/24/18, highest at 101, last known fever yesterday around 100. No fever today. Some body aches and headache. Feels like symptoms get worse at night. Also reports some wheezing.   Reports 67 month old granddaughter was sick recently and in the hospital.   Flu shot UTD.    Review of Systems  Constitutional: Positive for fever.  HENT: Positive for congestion, ear pain, sinus pressure and sore throat.   Respiratory: Positive for cough and wheezing. Negative for shortness of breath.   Gastrointestinal: Negative for diarrhea, nausea and vomiting.       Objective:   Physical Exam Vitals signs and nursing note reviewed.  Constitutional:      General: He is not in acute distress.    Appearance: He is not toxic-appearing.  HENT:     Head: Normocephalic and atraumatic.     Right Ear: Tympanic membrane normal.     Left Ear: Tympanic membrane normal.     Nose: Congestion present.     Right Sinus: Frontal sinus tenderness present.     Left Sinus: Frontal sinus tenderness present.     Mouth/Throat:     Mouth: Mucous membranes are moist.     Pharynx: Oropharynx is clear.  Eyes:     General:        Right eye: No discharge.        Left eye:  No discharge.  Neck:     Musculoskeletal: Neck supple. No neck rigidity.  Cardiovascular:     Rate and Rhythm: Normal rate and regular rhythm.     Heart sounds: Normal heart sounds.  Pulmonary:     Effort: Pulmonary effort is normal. No respiratory distress.     Breath sounds: No rales.     Comments: Bronchial congestion Lymphadenopathy:     Cervical: No cervical adenopathy.  Skin:    General: Skin is warm and dry.  Neurological:     Mental Status: He is alert and oriented to person, place, and time.  Psychiatric:        Mood and Affect: Mood normal.           Assessment & Plan:  Acute rhinosinusitis Discussed likely post viral etiology.  Will treat with doxycycline.  Also prescribed albuterol inhaler for as needed use for wheezing.  Warning signs discussed.  Symptomatic care discussed.  Follow-up if symptoms worsen or fail to improve.  Dr. Sallee Lange was consulted on this case and is in agreement with the above treatment plan.

## 2018-11-05 ENCOUNTER — Other Ambulatory Visit: Payer: Self-pay | Admitting: Family Medicine

## 2018-11-05 NOTE — Telephone Encounter (Signed)
Please give 6 months on each follow-up office visit in the spring

## 2019-01-08 ENCOUNTER — Encounter: Payer: Self-pay | Admitting: Family Medicine

## 2019-01-22 ENCOUNTER — Encounter: Payer: Self-pay | Admitting: Family Medicine

## 2019-02-04 ENCOUNTER — Other Ambulatory Visit: Payer: Self-pay | Admitting: Family Medicine

## 2019-02-05 NOTE — Telephone Encounter (Signed)
30 days and virtual visit 

## 2019-02-05 NOTE — Telephone Encounter (Signed)
Left message to return call 

## 2019-02-12 ENCOUNTER — Other Ambulatory Visit: Payer: Self-pay | Admitting: *Deleted

## 2019-02-12 MED ORDER — METOPROLOL TARTRATE 50 MG PO TABS: 50.0000 mg | ORAL_TABLET | Freq: Two times a day (BID) | ORAL | 0 refills | Status: DC

## 2019-02-18 ENCOUNTER — Other Ambulatory Visit: Payer: Self-pay

## 2019-02-18 ENCOUNTER — Ambulatory Visit (INDEPENDENT_AMBULATORY_CARE_PROVIDER_SITE_OTHER): Payer: Medicare Other | Admitting: Family Medicine

## 2019-02-18 DIAGNOSIS — M5440 Lumbago with sciatica, unspecified side: Secondary | ICD-10-CM

## 2019-02-18 DIAGNOSIS — E038 Other specified hypothyroidism: Secondary | ICD-10-CM

## 2019-02-18 DIAGNOSIS — E7849 Other hyperlipidemia: Secondary | ICD-10-CM | POA: Diagnosis not present

## 2019-02-18 DIAGNOSIS — D693 Immune thrombocytopenic purpura: Secondary | ICD-10-CM

## 2019-02-18 DIAGNOSIS — I10 Essential (primary) hypertension: Secondary | ICD-10-CM

## 2019-02-18 DIAGNOSIS — R600 Localized edema: Secondary | ICD-10-CM | POA: Diagnosis not present

## 2019-02-18 DIAGNOSIS — G8929 Other chronic pain: Secondary | ICD-10-CM

## 2019-02-18 DIAGNOSIS — Z1159 Encounter for screening for other viral diseases: Secondary | ICD-10-CM

## 2019-02-18 DIAGNOSIS — Z79899 Other long term (current) drug therapy: Secondary | ICD-10-CM

## 2019-02-18 NOTE — Progress Notes (Addendum)
   Subjective:    Patient ID: [name], male    DOB: 03-21-1951, 68 y.o.   MRN: [id number]  Hyperlipidemia  This is a chronic problem. The current episode started more than 1 year ago. There are no compliance problems (takes meds every day, eats health conscious, walks up and down driveway).    Pt states no concerns today. Patient on multiple medicines.  Does take his diuretic every morning to help with his pedal edema denies any problems with that Also takes his potassium regular basis  Denies any heart issues currently no chest tightness pressure pain shortness of breath takes his medications as planned  Reflux under good control takes his medicine on a regular basis  Thyroid states his energy level doing all right takes medicine regular basis  Cholesterol takes his medicine watch his diet previous labs reviewed new labs will be ordered  Chronic ITP no bruising issues or bleeding issues recently   Chronic low back pain actually doing much much better not on opioids anymore Virtual Visit via Video Note  I connected with [name] on 02/18/19 at  9:00 AM EDT by a video enabled telemedicine application and verified that I am speaking with the correct person using two identifiers.   I discussed the limitations of evaluation and management by telemedicine and the availability of in person appointments. The patient expressed understanding and agreed to proceed.  History of Present Illness:    Observations/Objective:   Assessment and Plan:   Follow Up Instructions:    I discussed the assessment and treatment plan with the patient. The patient was provided an opportunity to ask questions and all were answered. The patient agreed with the plan and demonstrated an understanding of the instructions.   The patient was advised to call back or seek an in-person evaluation if the symptoms worsen or if the condition fails to improve as anticipated.  I provided 25 minutes of  non-face-to-face time during this encounter.      Review of Systems     Objective:   Physical Exam   Unable to do physical exam via smart phone Follow-up office visit 5 to 6 months    Assessment & Plan:  The patient will do lab work later early June We will follow-up by early fall  Blood pressures been under good control according to the patient continue medications  Thyroid under good control takes his medicine every day check lab work in the future  Hyperlipidemia we will check lab work in June  Chronic low back pain no longer on opioids which is great  Pedal edema takes his diuretic regular basis does well  Chronic ITP no bleeding issues currently recheck CBC

## 2019-02-21 ENCOUNTER — Other Ambulatory Visit: Payer: Self-pay

## 2019-02-21 NOTE — Patient Outreach (Signed)
[facility]) Care Management  02/21/2019  [name] 15-Mar-1951 [id number]   Medication Adherence call to [name] HIPPA Compliant Voice message left with a call back number. [name] is showing past due on Lisinopril10 mg under [place].   [place] Management Direct Dial [phone]  Fax [phone] [email or web address]

## 2019-03-15 IMAGING — DX DG CHEST 1V PORT
1 series · 1 of 1 positions shown · non-contrast
Comparison: Chest radiograph 04/28/2018

CLINICAL DATA: Postoperative evaluation

EXAM:
PORTABLE CHEST 1 VIEW

[chest ap]
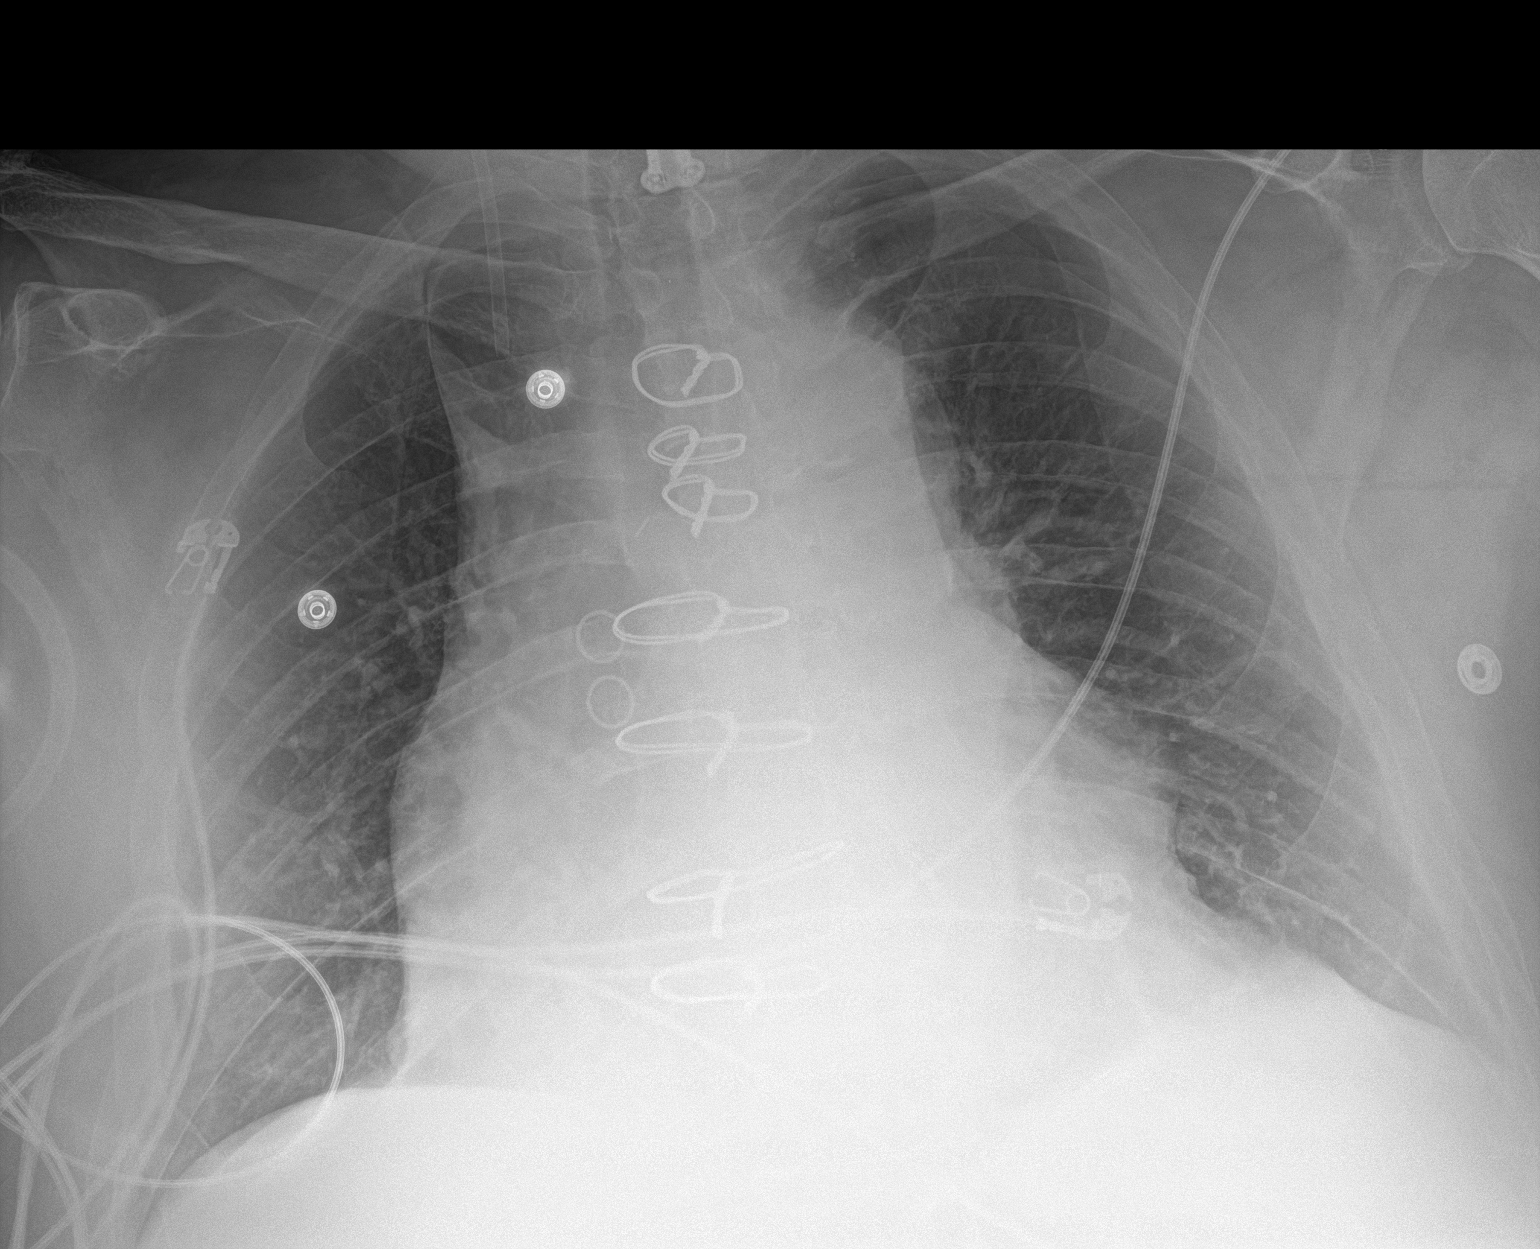

[1 of 1 positions shown; findings below may reference images not displayed]

FINDINGS: Monitoring leads overlie the patient. Right IJ sheath projects over
the superior vena cava. Interval removal left chest tube. Interval
retraction PA catheter. Stable cardiomegaly status post median
sternotomy. Persistent left basilar atelectasis. Similar-appearing
small left apical pneumothorax.
IMPRESSION: Persistent small left apical pneumothorax. Interval removal left
chest tube.

Minimal left basilar atelectasis.

## 2019-03-16 IMAGING — DX DG CHEST 1V PORT
1 series · 1 of 1 positions shown · non-contrast
Comparison: April 29, 2018

CLINICAL DATA: Status post coronary artery bypass grafting. Cordis
removal.

EXAM:
PORTABLE CHEST 1 VIEW

[chest ap]
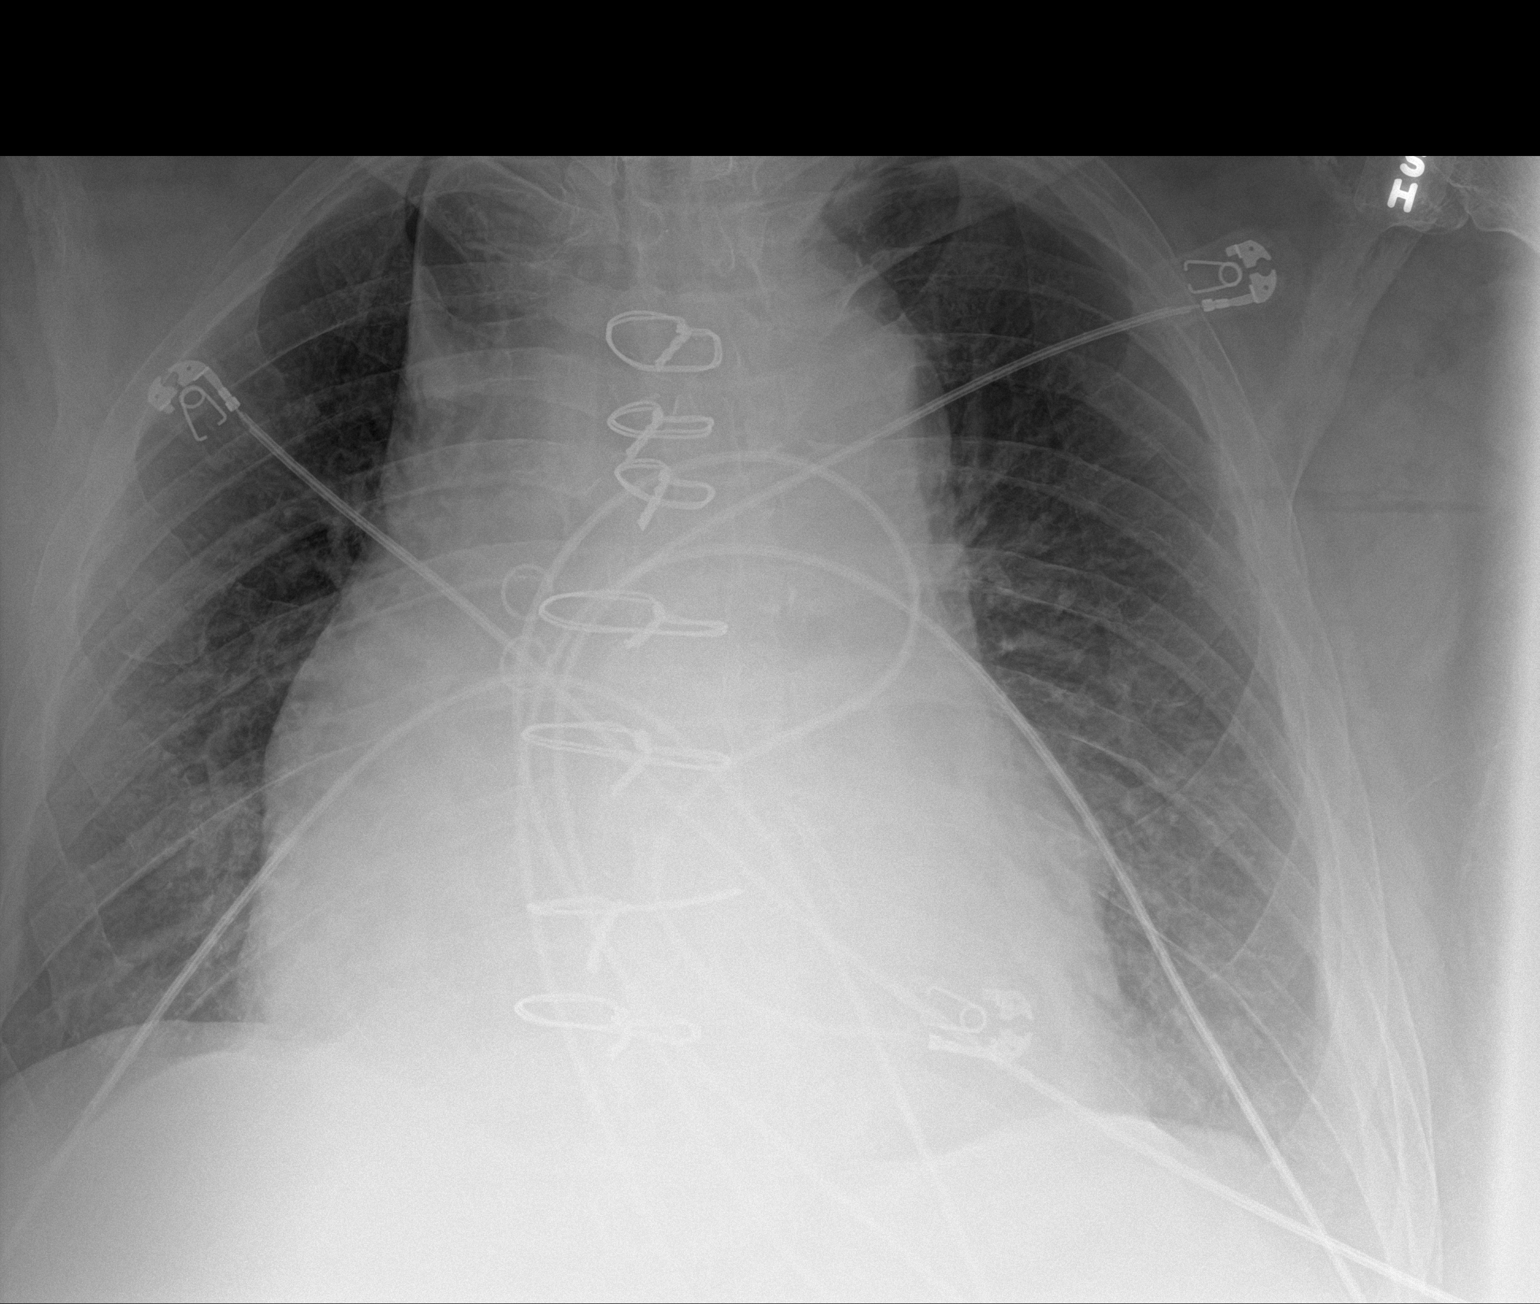

[1 of 1 positions shown; findings below may reference images not displayed]

FINDINGS: Cordis has been removed. The previously noted small apical
pneumothorax on the left appears slightly smaller compared to 1 day
prior. No tension component. No evident pneumothorax on the right.

There is mild bibasilar atelectasis. There is no edema or
consolidation. Heart remains prominent with pulmonary vascularity
normal, stable. Patient is status post coronary artery bypass
grafting. No evident adenopathy. Postoperative change noted in the
lower cervical spine.
IMPRESSION: Small left apical pneumothorax, slightly smaller compared to 1 day
previously. No tension component. Mild bibasilar atelectasis. No
frank edema or consolidation. Stable cardiac silhouette.

## 2019-03-22 ENCOUNTER — Other Ambulatory Visit: Payer: Self-pay

## 2019-03-22 ENCOUNTER — Telehealth: Payer: Self-pay | Admitting: Family Medicine

## 2019-03-22 MED ORDER — TAMSULOSIN HCL 0.4 MG PO CAPS: ORAL_CAPSULE | ORAL | 0 refills | Status: DC

## 2019-03-22 MED ORDER — ROSUVASTATIN CALCIUM 40 MG PO TABS: ORAL_TABLET | ORAL | 1 refills | Status: DC

## 2019-03-22 NOTE — Telephone Encounter (Signed)
Medication sent in and pt is aware  

## 2019-03-22 NOTE — Telephone Encounter (Signed)
Pt is checking on status of his medications being refilled. Pt states pharmacy requested the medications 4 days ago.   He is needing his flomax and cholesterol medication sent to [place], [place]

## 2019-03-28 ENCOUNTER — Other Ambulatory Visit: Payer: Self-pay

## 2019-03-28 NOTE — Patient Outreach (Signed)
[facility]) Care Management  03/28/2019  KOLSON CHOVANEC 1951-08-16 [id number]   Medication Adherence call to Mr. Auburn Hert HIPPA Compliant Voice message left with a call back number. Mr. Littler is showing past due on Lisinopril 10 mg under [place].   [place] Management Direct Dial [phone]  Fax [phone] [email or web address]

## 2019-04-08 ENCOUNTER — Encounter: Payer: Medicare Other | Admitting: Thoracic Surgery (Cardiothoracic Vascular Surgery)

## 2019-04-12 NOTE — Progress Notes (Deleted)
Cardiology Office Note    Date:  04/12/2019   ID:  [name], DOB 12-24-1950, MRN [id number]  PCP:  Kathyrn Drown, MD  Cardiologist: Jenkins Rouge, MD    No chief complaint on file.   History of Present Illness:    [name] is a 68 y.o. male with past medical history of CAD (s/p prior stenting of LCx and RCA in 2003, stenting of OM in 2012), HTN, HLD, chronic ITP and Hypothyroidism who presents to the office today post CABG   Presented end of June 2019 with progressive angina  Cath arranged  This was performed by Dr. Irish Lack on 04/24/2018 and showed severe three-vessel disease with occlusion of previously placed stents along the RCA and LCx and 75% LAD stenosis. CT surgery was consulted for consideration of CABG. He underwent CABG by Dr. Roxy Manns on 04/27/2018 with LIMA-LAD, SVG-OM1, and SVG-OM2. He did develop postoperative atrial fibrillation and converted back to normal sinus rhythm with initiation of IV Amiodarone. TSH was elevated to 9.090 and Synthroid dosing was appropriately adjusted. Platelet count remained stable and gradually trended upwards to 141 K on 05/01/2018.  He was discharged home on 05/03/2018 on ASA 81mg  daily, Amiodarone 200mg  BID, Atorvastatin 80mg  daily, Lisinopril 10mg  daily, Lopressor 50mg  BID, and Torsemide 40mg  daily.   Since CABG has lost over 50 lbs Mostly more active and cutting out soda  ***   Past Medical History:  Diagnosis Date  . Allergy   . Carpal tunnel syndrome   . Chronic back pain   . Chronic combined systolic and diastolic congestive heart failure ([place])   . Chronic ITP (idiopathic thrombocytopenia) ([facility]) 05/25/2015  . Coronary artery disease   . Coronary artery disease involving native coronary artery of native heart with angina pectoris ([place])   . Degenerative disc disease   . GERD (gastroesophageal reflux disease)   . History of thrombocytopenia   . Hypercholesteremia   . Hypertension   . Hypothyroid   . Lumbar pain   . Myocardial  infarction ([place]) 2009  . Obesity   . S/P CABG x 3 04/27/2018   LIMA to LAD SVG to OM1 SVG to OM2  . Shortness of breath dyspnea     Past Surgical History:  Procedure Laterality Date  . BACK SURGERY     5 lumbas disc with cervical and lumbar fusions  . BIOPSY N/A 03/06/2014   Procedure: ESOPHAGEAL BIOPSIES;  Surgeon: [name], MD;  Location: [facility];  Service: Endoscopy;  Laterality: N/A;  . COLONOSCOPY    . COLONOSCOPY WITH PROPOFOL N/A 03/06/2014   Procedure: COLONOSCOPY WITH PROPOFOL;  Surgeon: [name], MD;  Location: [facility];  Service: Endoscopy;  Laterality: N/A;  in cecum at 0807; total withdrawal time 9 minutes  . CORONARY ANGIOPLASTY WITH STENT PLACEMENT     2000, and 2004 has 3 stents  . CORONARY ARTERY BYPASS GRAFT N/A 04/27/2018   Procedure: CORONARY ARTERY BYPASS GRAFTING (CABG) x Three , using left internal mammary artery and right leg greater saphenous vein;  Surgeon: [name], MD;  Location: [facility];  Service: Open Heart Surgery;  Laterality: N/A;  . ESOPHAGOGASTRODUODENOSCOPY (EGD) WITH PROPOFOL N/A 03/06/2014   Procedure: ESOPHAGOGASTRODUODENOSCOPY (EGD) WITH PROPOFOL;  Surgeon: [name], MD;  Location: [facility];  Service: Endoscopy;  Laterality: N/A;  . LEFT HEART CATH AND CORONARY ANGIOGRAPHY N/A 04/24/2018   Procedure: LEFT HEART CATH AND CORONARY ANGIOGRAPHY;  Surgeon: [name], MD;  Location:  [facility];  Service: Cardiovascular;  Laterality: N/A;  . LUMBAR FUSION  2009  . MALONEY DILATION N/A 03/06/2014   Procedure: MALONEY DILATION 54 french;  Surgeon: [name], MD;  Location: [facility];  Service: Endoscopy;  Laterality: N/A;  . NECK SURGERY    . TEE WITHOUT CARDIOVERSION N/A 04/27/2018   Procedure: TRANSESOPHAGEAL ECHOCARDIOGRAM (TEE);  Surgeon: [name], MD;  Location: [facility];  Service: Open Heart Surgery;  Laterality: N/A;    Current Medications: Outpatient Medications Prior to Visit  Medication Sig Dispense Refill   . albuterol (PROVENTIL HFA;VENTOLIN HFA) 108 (90 Base) MCG/ACT inhaler Inhale 2 puffs into the lungs every 6 (six) hours as needed for wheezing or shortness of breath. 1 Inhaler 0  . aspirin EC 81 MG EC tablet Take 1 tablet (81 mg total) by mouth daily.    . citalopram (CELEXA) 20 MG tablet TAKE 1 TABLET BY MOUTH EVERY MORNING. 90 tablet 0  . levothyroxine (SYNTHROID, LEVOTHROID) 150 MCG tablet TAKE 1 TABLET BY MOUTH DAILY BEFORE BREAKFAST. 90 tablet 1  . lisinopril (PRINIVIL,ZESTRIL) 10 MG tablet TAKE 1 TABLET BY MOUTH DAILY. 30 tablet 0  . metoprolol tartrate (LOPRESSOR) 50 MG tablet Take 1 tablet (50 mg total) by mouth 2 (two) times daily. 60 tablet 0  . Multiple Vitamin (MULTIVITAMIN) capsule Take 1 capsule by mouth daily.      . pantoprazole (PROTONIX) 40 MG tablet TAKE 1 TABLET BY MOUTH ONCE A DAY. 90 tablet 0  . potassium chloride (K-DUR) 10 MEQ tablet TAKE 1 TABLET BY MOUTH DAILY 90 tablet 1  . potassium chloride SA (K-DUR,KLOR-CON) 10 MEQ tablet Take 1 tablet (10 mEq total) by mouth daily. 30 tablet 1  . rosuvastatin (CRESTOR) 40 MG tablet Take one po Q evening. 90 tablet 1  . tamsulosin (FLOMAX) 0.4 MG CAPS capsule Take 0.4 mg by mouth. 30 capsule 0  . torsemide (DEMADEX) 20 MG tablet Take 2 tablets (40 mg total) by mouth daily. 180 tablet 1   No facility-administered medications prior to visit.      Allergies:   Penicillins, Sulfa antibiotics, and Zoloft [sertraline hcl]   Social History   Socioeconomic History  . Marital status: Married    Spouse name: Not on file  . Number of children: Not on file  . Years of education: Not on file  . Highest education level: Not on file  Occupational History  . Not on file  Social Needs  . Financial resource strain: Not on file  . Food insecurity    Worry: Not on file    Inability: Not on file  . Transportation needs    Medical: Not on file    Non-medical: Not on file  Tobacco Use  . Smoking status: Former Smoker    Packs/day:  2.00    Years: 20.00    Pack years: 40.00    Types: Cigarettes    Quit date: 07/06/1991    Years since quitting: 27.7  . Smokeless tobacco: Never Used  Substance and Sexual Activity  . Alcohol use: No    Comment: occassional  . Drug use: No  . Sexual activity: Not Currently    Birth control/protection: None  Lifestyle  . Physical activity    Days per week: Not on file    Minutes per session: Not on file  . Stress: Not on file  Relationships  . Social Herbalist on phone: Not on file    Gets  together: Not on file    Attends religious service: Not on file    Active member of club or organization: Not on file    Attends meetings of clubs or organizations: Not on file    Relationship status: Not on file  Other Topics Concern  . Not on file  Social History Narrative   Disabled due to chronic back pain     Family History:  The patient's family history includes Heart attack in his father.   Review of Systems:   Please see the history of present illness.     General:  No chills, fever, night sweats or weight changes. Positive for chronic back pain.  Cardiovascular:  No dyspnea on exertion, edema, orthopnea, palpitations, paroxysmal nocturnal dyspnea. Positive for sternal chest pain (along incision).  Dermatological: No rash, lesions/masses Respiratory: No cough, dyspnea Urologic: No hematuria, dysuria Abdominal:   No nausea, vomiting, diarrhea, bright red blood per rectum, melena, or hematemesis Neurologic:  No visual changes, wkns, changes in mental status.  All other systems reviewed and are otherwise negative except as noted above.   Physical Exam:    VS:  There were no vitals taken for this visit.   Affect appropriate Healthy:  appears stated age 31: normal Neck supple with no adenopathy JVP normal no bruits no thyromegaly Lungs clear with no wheezing and good diaphragmatic motion Heart:  S1/S2 no murmur, no rub, gallop or click PMI normal post  sternotomy  Abdomen: benighn, BS positve, no tenderness, no AAA no bruit.  No HSM or HJR Distal pulses intact with no bruits Some chronic venous stasis  Neuro non-focal Skin warm and dry No muscular weakness   Wt Readings from Last 3 Encounters:  10/29/18 116.9 kg  09/24/18 116.6 kg  07/09/18 119.3 kg     Studies/Labs Reviewed:   EKG:  EKG is ordered today. The ekg ordered today demonstrates NSR, HR 67, with T-wave abnormalities along inferior and lateral leads (similar to prior tracings).   Recent Labs: 04/25/2018: B Natriuretic Peptide 152.8 04/28/2018: Magnesium 2.4 05/23/2018: Hemoglobin 11.0; Platelets 201 06/07/2018: ALT 13; BUN 12; Creatinine, Ser 1.41; Potassium 4.4; Sodium 142; TSH 2.340   Lipid Panel    Component Value Date/Time   CHOL 139 06/07/2018 1400   TRIG 127 06/07/2018 1400   HDL 35 (L) 06/07/2018 1400   CHOLHDL 4.0 06/07/2018 1400   CHOLHDL 5.7 04/25/2018 0358   VLDL 25 04/25/2018 0358   LDLCALC 79 06/07/2018 1400    Additional studies/ records that were reviewed today include:   Cardiac Catheterization: 04/24/2018  Prox RCA lesion is 95% stenosed.  Dist RCA-2 lesion is 100% stenosed.  Dist RCA-1 lesion is 99% stenosed.  Ost 1st Mrg lesion is 100% stenosed.  Lat 1st Mrg lesion is 100% stenosed.  Ost LAD to Prox LAD lesion is 50% stenosed.  Mid LAD-1 lesion is 75% stenosed. This is seen best in teh LAO caudal view.  Mid LAD-2 lesion is 50% stenosed.  There is mild left ventricular systolic dysfunction.  The left ventricular ejection fraction is 45-50% by visual estimate.  LV end diastolic pressure is normal.  There is no aortic valve stenosis.   Severe three vessel disease including occlusion of both prior stented segments.  De novo LAD lesion, beset seen in the LAD caudal view, and clearly worse from prior cath.  Cardiac surgery consult.    Will admit since he reports pain at rest.  Will hold off on heparin for now given prior  platelet issues.  May need to start if pain occurs.   Echocardiogram: 04/25/2018 Study Conclusions  - Left ventricle: The cavity size was normal. Systolic function was   mildly reduced. The estimated ejection fraction was in the range   of 45% to 50%. Hypokinesis of the inferolateral, anterior and   anteroseptal, and anterolateral myocardium. Doppler parameters   are consistent with abnormal left ventricular relaxation (grade 1   diastolic dysfunction). Doppler parameters are consistent with   high ventricular filling pressure. - Aortic valve: Transvalvular velocity was within the normal range.   There was no stenosis. There was mild regurgitation directed   towards the mitral anterior leaflet. Regurgitation pressure   half-time: 887 ms. - Aorta: Aortic root dimension: 39 mm (ED). - Ascending aorta: The ascending aorta was mildly dilated. - Mitral valve: Transvalvular velocity was within the normal range.   There was no evidence for stenosis. There was trivial   regurgitation. - Left atrium: The atrium was severely dilated. - Right ventricle: The cavity size was normal. Wall thickness was   normal. Systolic function was normal. - Tricuspid valve: There was trivial regurgitation. - Pulmonary arteries: Systolic pressure was within the normal   range.  Assessment:    CAD/CABG   Plan:   In order of problems listed above:  1. CAD - s/p prior stenting of LCx and RCA in 2003 with stenting of OM in 2012.  He underwent CABG by Dr. Roxy Manns on 04/27/2018 with LIMA-LAD, SVG-OM1, and SVG-OM2.   - Continue ASA (on 81mg  daily due to known ITP), statin, and beta-blocker therapy.  2. Post-Operative Atrial Fibrillation - Occurred following his CABG and he converted to normal sinus rhythm  No longer on amiodarone   3. HTN - Well controlled.  Continue current medications and low sodium Dash type diet.   - Continue Lisinopril 10 mg daily and Lopressor 50 mg twice daily at this time.  4. HLD -  continue high dose statin LDL 79 06/07/18 with normal LFTls Due for repeat labs   5. ITP - Trough platelet count was 79K on 04/28/2018, improved to 141K at the time of most recent check on 05/01/2018. - He denies any evidence of active bleeding. Will remain on ASA 81 mg daily. F/U labs due as once from April postponed due to COVID  6. Chronic Venous Stasis - No significant edema appreciated on examination today. He remains on Torsemide 40 mg daily. He is now following a low-sodium diet.  Labs CMP, lipids, TSH CBC with PLT F/U in a year    Signed, Jenkins Rouge, MD  04/12/2019 3:10 PM    [facility] [address] [place], [place] [address] Phone: [phone]

## 2019-04-15 ENCOUNTER — Other Ambulatory Visit: Payer: Self-pay

## 2019-04-15 ENCOUNTER — Telehealth (INDEPENDENT_AMBULATORY_CARE_PROVIDER_SITE_OTHER): Payer: Medicare Other | Admitting: Thoracic Surgery (Cardiothoracic Vascular Surgery)

## 2019-04-15 DIAGNOSIS — Z951 Presence of aortocoronary bypass graft: Secondary | ICD-10-CM | POA: Diagnosis not present

## 2019-04-15 NOTE — Patient Instructions (Signed)
Continue all previous medications without any changes at this time  Make every effort to stay physically active, get some type of exercise on a regular basis, and stick to a "heart healthy diet".  The long term benefits for regular exercise and a healthy diet are critically important to your overall health and wellbeing.  

## 2019-04-15 NOTE — Progress Notes (Signed)
[address]       [place] [address]             [phone]     CARDIOTHORACIC SURGERY TELEPHONE VIRTUAL OFFICE NOTE  Referring Provider is Josue Hector, MD Primary Cardiologist is Jenkins Rouge, MD PCP is Kathyrn Drown, MD   HPI:  I spoke with [name] (DOB [birth year] ) via telephone on 04/15/2019 at 10:44 AM and verified that I was speaking with the correct person using more than one form of identification.  We discussed the reason(s) for conducting our visit virtually instead of in-person.  The patient expressed understanding the circumstances and agreed to proceed as described.   Patient is a 68 year old male with history of coronary artery disease, hypertension, hyperlipidemia, ITP, and chronic back pain who underwent coronary artery bypass grafting x3 on April 27, 2018 for severe three-vessel coronary artery disease with mild to moderate left ventricular systolic dysfunction and symptoms of exertional angina and shortness of breath.  He was last seen here in our office on July 09, 2018 at which time he was doing very well.  I will spoke with him over the telephone today to check and see how he is doing.  He reports that he is doing exceptionally well.  He has not been as active over the last couple of months because of the COVID-19 pandemic.  He has been staying home most of the time.  However, he reports that he has no physical limitations other than that related to chronic arthritis pain in his knees.  He states that he only gets short of breath if he is doing something very strenuous.  He denies any history of exertional chest discomfort or chest tightness.  Overall he reports that he feels much improved in comparison with prior to his surgery last year.  He has no problems or questions about his medications.   Current Outpatient Medications  Medication Sig Dispense Refill  . albuterol (PROVENTIL HFA;VENTOLIN HFA) 108 (90 Base) MCG/ACT inhaler  Inhale 2 puffs into the lungs every 6 (six) hours as needed for wheezing or shortness of breath. 1 Inhaler 0  . aspirin EC 81 MG EC tablet Take 1 tablet (81 mg total) by mouth daily.    . citalopram (CELEXA) 20 MG tablet TAKE 1 TABLET BY MOUTH EVERY MORNING. 90 tablet 0  . levothyroxine (SYNTHROID, LEVOTHROID) 150 MCG tablet TAKE 1 TABLET BY MOUTH DAILY BEFORE BREAKFAST. 90 tablet 1  . lisinopril (PRINIVIL,ZESTRIL) 10 MG tablet TAKE 1 TABLET BY MOUTH DAILY. 30 tablet 0  . metoprolol tartrate (LOPRESSOR) 50 MG tablet Take 1 tablet (50 mg total) by mouth 2 (two) times daily. 60 tablet 0  . Multiple Vitamin (MULTIVITAMIN) capsule Take 1 capsule by mouth daily.      . pantoprazole (PROTONIX) 40 MG tablet TAKE 1 TABLET BY MOUTH ONCE A DAY. 90 tablet 0  . potassium chloride (K-DUR) 10 MEQ tablet TAKE 1 TABLET BY MOUTH DAILY 90 tablet 1  . potassium chloride SA (K-DUR,KLOR-CON) 10 MEQ tablet Take 1 tablet (10 mEq total) by mouth daily. 30 tablet 1  . rosuvastatin (CRESTOR) 40 MG tablet Take one po Q evening. 90 tablet 1  . tamsulosin (FLOMAX) 0.4 MG CAPS capsule Take 0.4 mg by mouth. 30 capsule 0  . torsemide (DEMADEX) 20 MG tablet Take 2 tablets (40 mg total) by mouth daily. 180 tablet 1   No current facility-administered medications for this visit.  Diagnostic Tests:  n/a   Impression:  Patient is doing well approximately 1 year status post coronary artery bypass grafting  Plan:  We have not recommended any change the patient's current medications.  The patient has been reminded regarding the many benefits of a heart healthy diet and regular exercise.  All of his questions have been addressed.    I discussed limitations of evaluation and management via telephone.  The patient was advised to call back for repeat telephone consultation or to seek an in-person evaluation if questions arise or the patient's clinical condition changes in any significant manner.  I spent in excess of 10  minutes of non-face-to-face time during the conduct of this telephone virtual office consultation.     Valentina Gu. Roxy Manns, MD 04/15/2019 10:44 AM

## 2019-04-16 ENCOUNTER — Other Ambulatory Visit: Payer: Self-pay | Admitting: Family Medicine

## 2019-04-18 ENCOUNTER — Ambulatory Visit: Payer: Medicare Other | Admitting: Cardiovascular Disease

## 2019-04-18 ENCOUNTER — Encounter: Payer: Self-pay | Admitting: Cardiovascular Disease

## 2019-06-06 ENCOUNTER — Other Ambulatory Visit: Payer: Self-pay | Admitting: Family Medicine

## 2019-06-14 DIAGNOSIS — H2513 Age-related nuclear cataract, bilateral: Secondary | ICD-10-CM | POA: Diagnosis not present

## 2019-07-01 ENCOUNTER — Other Ambulatory Visit: Payer: Self-pay | Admitting: Family Medicine

## 2019-07-02 NOTE — Telephone Encounter (Signed)
appt scheduled for 07/23/2019 - pt aware we will send in refill, pt verbalized understanding

## 2019-07-02 NOTE — Telephone Encounter (Signed)
May have this +1 refill needs follow-up virtual or in person visit in September or early October

## 2019-07-02 NOTE — Telephone Encounter (Signed)
Please schedule and then route back to  Nurses to send in refill. thanks

## 2019-07-03 DIAGNOSIS — L820 Inflamed seborrheic keratosis: Secondary | ICD-10-CM | POA: Diagnosis not present

## 2019-07-23 ENCOUNTER — Other Ambulatory Visit: Payer: Self-pay

## 2019-07-23 ENCOUNTER — Ambulatory Visit (INDEPENDENT_AMBULATORY_CARE_PROVIDER_SITE_OTHER): Payer: Medicare Other | Admitting: Family Medicine

## 2019-07-23 DIAGNOSIS — I10 Essential (primary) hypertension: Secondary | ICD-10-CM

## 2019-07-23 DIAGNOSIS — E038 Other specified hypothyroidism: Secondary | ICD-10-CM | POA: Diagnosis not present

## 2019-07-23 DIAGNOSIS — D696 Thrombocytopenia, unspecified: Secondary | ICD-10-CM | POA: Diagnosis not present

## 2019-07-23 DIAGNOSIS — E7849 Other hyperlipidemia: Secondary | ICD-10-CM | POA: Diagnosis not present

## 2019-07-23 MED ORDER — LISINOPRIL 10 MG PO TABS: 10.0000 mg | ORAL_TABLET | Freq: Every day | ORAL | 1 refills | Status: DC

## 2019-07-23 MED ORDER — LEVOTHYROXINE SODIUM 150 MCG PO TABS: ORAL_TABLET | ORAL | 1 refills | Status: DC

## 2019-07-23 MED ORDER — METOPROLOL TARTRATE 50 MG PO TABS: 50.0000 mg | ORAL_TABLET | Freq: Two times a day (BID) | ORAL | 5 refills | Status: DC

## 2019-07-23 MED ORDER — PANTOPRAZOLE SODIUM 40 MG PO TBEC: 40.0000 mg | DELAYED_RELEASE_TABLET | Freq: Every day | ORAL | 1 refills | Status: DC

## 2019-07-23 MED ORDER — CITALOPRAM HYDROBROMIDE 20 MG PO TABS: 20.0000 mg | ORAL_TABLET | Freq: Every morning | ORAL | 1 refills | Status: DC

## 2019-07-23 MED ORDER — ROSUVASTATIN CALCIUM 40 MG PO TABS: ORAL_TABLET | ORAL | 1 refills | Status: DC

## 2019-07-23 MED ORDER — TAMSULOSIN HCL 0.4 MG PO CAPS: ORAL_CAPSULE | ORAL | 5 refills | Status: DC

## 2019-07-23 MED ORDER — POTASSIUM CHLORIDE ER 10 MEQ PO TBCR: 10.0000 meq | EXTENDED_RELEASE_TABLET | Freq: Every day | ORAL | 1 refills | Status: DC

## 2019-07-23 NOTE — Progress Notes (Signed)
Subjective:    Patient ID: [name], male    DOB: 08/05/1951, 68 y.o.   MRN: [id number] Telephone only video not possible Hypertension This is a chronic problem. Pertinent negatives include no chest pain, headaches or shortness of breath. There are no compliance problems.    Pt is needing refills on all chronic meds. Pt states he is doing well on medication. BP has been doing ok; no issues.  Patient for blood pressure check up.  The patient does have hypertension.  The patient is on medication.  Patient relates compliance with meds. Todays BP reviewed with the patient. Patient denies issues with medication. Patient relates reasonable diet. Patient tries to minimize salt. Patient aware of BP goals. Patient relates his blood pressure stays in a good range he does try to watch his diet  Patient has thyroid condition.  Takes thyroid medication on a regular basis.  States that the proper way.  Relates compliance.  States no negative side effects.  States condition seems to be under good control. He does take his medicine states energy level overall doing good does need follow-up on labs  Patient also has history of thrombocytopenia will be having multiple teeth pulled in the near future will need to do lab work no bleeding issues lately  Patient does have cardiac history denies any chest tightness pressure pain shortness of breath Virtual Visit via Video Note  I connected with [name] on 07/23/19 at  2:00 PM EDT by a video enabled telemedicine application and verified that I am speaking with the correct person using two identifiers.  Location: Patient: home Provider: office   I discussed the limitations of evaluation and management by telemedicine and the availability of in person appointments. The patient expressed understanding and agreed to proceed.  History of Present Illness:    Observations/Objective:   Assessment and Plan:   Follow Up Instructions:    I discussed  the assessment and treatment plan with the patient. The patient was provided an opportunity to ask questions and all were answered. The patient agreed with the plan and demonstrated an understanding of the instructions.   The patient was advised to call back or seek an in-person evaluation if the symptoms worsen or if the condition fails to improve as anticipated.  I provided 25 minutes of non-face-to-face time during this encounter.   Vicente Males, LPN    Review of Systems  Constitutional: Negative for diaphoresis and fatigue.  HENT: Negative for congestion and rhinorrhea.   Respiratory: Negative for cough and shortness of breath.   Cardiovascular: Negative for chest pain and leg swelling.  Gastrointestinal: Negative for abdominal pain and diarrhea.  Skin: Negative for color change and rash.  Neurological: Negative for dizziness and headaches.  Psychiatric/Behavioral: Negative for behavioral problems and confusion.       Objective:    Today's visit was via telephone Physical exam was not possible for this visit       Assessment & Plan:  1. Other specified hypothyroidism Patient has hypothyroidism takes his medication needs lab work done has been done in the year lab work was already ordered it is in the system  2. Other hyperlipidemia History of heart disease and hyperlipidemia not does take medication lab work already ordered previous labs reviewed  3. Essential hypertension Elevated blood pressure patient thinks it is doing well.  He will continue to take his medicine watches diet  4. Thrombocytopenia ([facility]) Thrombocytopenia will check CBC  Patient is  scheduled to have his teeth removed in October which I think is a good idea given that they are rotted and pose a increased risk of endocarditis  25 minutes was spent with the patient.  This statement verifies that 25 minutes was indeed spent with the patient.  More than 50% of this visit-total duration of the visit-was  spent in counseling and coordination of care. The issues that the patient came in for today as reflected in the diagnosis (s) please refer to documentation for further details.

## 2019-07-26 ENCOUNTER — Encounter: Payer: Self-pay | Admitting: Student

## 2019-07-26 ENCOUNTER — Ambulatory Visit (INDEPENDENT_AMBULATORY_CARE_PROVIDER_SITE_OTHER): Payer: Medicare Other | Admitting: Student

## 2019-07-26 ENCOUNTER — Other Ambulatory Visit: Payer: Self-pay

## 2019-07-26 VITALS — BP 132/78 | HR 55 | Temp 96.8°F | Ht 71.0 in | Wt 267.0 lb

## 2019-07-26 DIAGNOSIS — E7849 Other hyperlipidemia: Secondary | ICD-10-CM | POA: Diagnosis not present

## 2019-07-26 DIAGNOSIS — Z79899 Other long term (current) drug therapy: Secondary | ICD-10-CM | POA: Diagnosis not present

## 2019-07-26 DIAGNOSIS — I10 Essential (primary) hypertension: Secondary | ICD-10-CM

## 2019-07-26 DIAGNOSIS — E785 Hyperlipidemia, unspecified: Secondary | ICD-10-CM

## 2019-07-26 DIAGNOSIS — E038 Other specified hypothyroidism: Secondary | ICD-10-CM | POA: Diagnosis not present

## 2019-07-26 DIAGNOSIS — Z01810 Encounter for preprocedural cardiovascular examination: Secondary | ICD-10-CM

## 2019-07-26 DIAGNOSIS — I4891 Unspecified atrial fibrillation: Secondary | ICD-10-CM

## 2019-07-26 DIAGNOSIS — I9789 Other postprocedural complications and disorders of the circulatory system, not elsewhere classified: Secondary | ICD-10-CM

## 2019-07-26 DIAGNOSIS — I25118 Atherosclerotic heart disease of native coronary artery with other forms of angina pectoris: Secondary | ICD-10-CM

## 2019-07-26 DIAGNOSIS — Z1159 Encounter for screening for other viral diseases: Secondary | ICD-10-CM | POA: Diagnosis not present

## 2019-07-26 NOTE — Patient Instructions (Signed)
Medication Instructions: Your physician recommends that you continue on your current medications as directed. Please refer to the Current Medication list given to you today.   Labwork: None today  Procedures/Testing: None today  Follow-Up: 6 months with Dr.Nishan  Any Additional Special Instructions Will Be Listed Below (If Applicable).     If you need a refill on your cardiac medications before your next appointment, please call your pharmacy.     Thank you for choosing [facility] !

## 2019-07-26 NOTE — Progress Notes (Signed)
Cardiology Office Note    Date:  07/26/2019   ID:  [name], DOB [birth year], MRN [id number]  PCP:  Kathyrn Drown, MD  Cardiologist: Jenkins Rouge, MD    Chief Complaint  Patient presents with   Follow-up    Overdue 6 month visit; Cardiac Clearance    History of Present Illness:    [name] is a 68 y.o. male with past medical history of CAD (s/p prior stenting of LCx and RCA in 2003, stenting of OM in 2012, CABG on 04/27/2018 with LIMA-LAD, SVG-OM1, and SVG-OM2), post-operative atrial fibrillation (occurring at time of CABG), HTN, HLD, Hypothyroidism and ITP who presents to the office today for cardiac clearance evaluation.  He was last examined by Dr. Johnsie Cancel in 08/2018 and denied any exertional chest pain but did have some muscular discomfort.  He had lost over 50 pounds since making dietary changes.  He was continued on ASA, statin, and beta-blocker therapy with Amiodarone being discontinued given that he had maintained normal sinus rhythm.  Was informed to follow-up in 6 months but has not been evaluated since.  In talking with the patient today, he reports overall doing well from a cardiac perspective since his last office visit. He denies any recent exertional chest pain or dyspnea on exertion. Has been walking for exercise and doing yard work without any anginal symptoms. Does report having occasional tingling sensations along his pectoral region which typically resolves within a few seconds. He has not had to utilize sublingual nitroglycerin. He denies any specific palpitations, orthopnea, PND, or lower extremity edema. He has gained approximately 10 pounds within the past 6+ months and he accredits this to increased food consumption due to COVID-19.  He is planning to undergo multiple teeth extractions with denture placement in the next few weeks.  By review of his clearance sheet today this is to be performed with local anesthetic and he will not be placed under  anesthesia.  Past Medical History:  Diagnosis Date   Allergy    Carpal tunnel syndrome    Chronic back pain    Chronic combined systolic and diastolic congestive heart failure (HCC)    Chronic ITP (idiopathic thrombocytopenia) (HCC) 05/25/2015   Coronary artery disease    Coronary artery disease involving native coronary artery of native heart with angina pectoris (HCC)    Degenerative disc disease    GERD (gastroesophageal reflux disease)    History of thrombocytopenia    Hypercholesteremia    Hypertension    Hypothyroid    Lumbar pain    Myocardial infarction ([place]) 2009   Obesity    S/P CABG x 3 04/27/2018   LIMA to LAD SVG to OM1 SVG to OM2   Shortness of breath dyspnea     Past Surgical History:  Procedure Laterality Date   BACK SURGERY     5 lumbas disc with cervical and lumbar fusions   BIOPSY N/A 03/06/2014   Procedure: ESOPHAGEAL BIOPSIES;  Surgeon: [name], MD;  Location: [facility];  Service: Endoscopy;  Laterality: N/A;   COLONOSCOPY     COLONOSCOPY WITH PROPOFOL N/A 03/06/2014   Procedure: COLONOSCOPY WITH PROPOFOL;  Surgeon: [name], MD;  Location: [facility];  Service: Endoscopy;  Laterality: N/A;  in cecum at 0807; total withdrawal time 9 minutes   CORONARY ANGIOPLASTY WITH STENT PLACEMENT     2000, and 2004 has 3 stents   CORONARY ARTERY BYPASS GRAFT N/A 04/27/2018   Procedure: CORONARY ARTERY  BYPASS GRAFTING (CABG) x Three , using left internal mammary artery and right leg greater saphenous vein;  Surgeon: [name], MD;  Location: [facility];  Service: Open Heart Surgery;  Laterality: N/A;   ESOPHAGOGASTRODUODENOSCOPY (EGD) WITH PROPOFOL N/A 03/06/2014   Procedure: ESOPHAGOGASTRODUODENOSCOPY (EGD) WITH PROPOFOL;  Surgeon: [name], MD;  Location: [facility];  Service: Endoscopy;  Laterality: N/A;   LEFT HEART CATH AND CORONARY ANGIOGRAPHY N/A 04/24/2018   Procedure: LEFT HEART CATH AND CORONARY ANGIOGRAPHY;  Surgeon: [name], MD;  Location: [facility];  Service: Cardiovascular;  Laterality: N/A;   LUMBAR FUSION  2009   MALONEY DILATION N/A 03/06/2014   Procedure: MALONEY DILATION 54 french;  Surgeon: [name], MD;  Location: [facility];  Service: Endoscopy;  Laterality: N/A;   NECK SURGERY     TEE WITHOUT CARDIOVERSION N/A 04/27/2018   Procedure: TRANSESOPHAGEAL ECHOCARDIOGRAM (TEE);  Surgeon: [name], MD;  Location: [facility];  Service: Open Heart Surgery;  Laterality: N/A;    Current Medications: Outpatient Medications Prior to Visit  Medication Sig Dispense Refill   albuterol (PROVENTIL HFA;VENTOLIN HFA) 108 (90 Base) MCG/ACT inhaler Inhale 2 puffs into the lungs every 6 (six) hours as needed for wheezing or shortness of breath. 1 Inhaler 0   aspirin EC 81 MG EC tablet Take 1 tablet (81 mg total) by mouth daily.     citalopram (CELEXA) 20 MG tablet Take 1 tablet (20 mg total) by mouth every morning. 90 tablet 1   levothyroxine (SYNTHROID) 150 MCG tablet TAKE 1 TABLET BY MOUTH DAILY BEFORE BREAKFAST. 90 tablet 1   lisinopril (ZESTRIL) 10 MG tablet Take 1 tablet (10 mg total) by mouth daily. 90 tablet 1   metoprolol tartrate (LOPRESSOR) 50 MG tablet Take 1 tablet (50 mg total) by mouth 2 (two) times daily. 60 tablet 5   Multiple Vitamin (MULTIVITAMIN) capsule Take 1 capsule by mouth daily.       pantoprazole (PROTONIX) 40 MG tablet Take 1 tablet (40 mg total) by mouth daily. 90 tablet 1   potassium chloride (K-DUR) 10 MEQ tablet Take 1 tablet (10 mEq total) by mouth daily. 90 tablet 1   potassium chloride SA (K-DUR,KLOR-CON) 10 MEQ tablet Take 1 tablet (10 mEq total) by mouth daily. 30 tablet 1   rosuvastatin (CRESTOR) 40 MG tablet Take one po Q evening. 90 tablet 1   tamsulosin (FLOMAX) 0.4 MG CAPS capsule TAKE 1 CAPSULE BY MOUTH DAILY. 30 capsule 5   torsemide (DEMADEX) 20 MG tablet Take 2 tablets (40 mg total) by mouth daily. 180 tablet 1   No facility-administered  medications prior to visit.      Allergies:   Penicillins, Sulfa antibiotics, and Zoloft [sertraline hcl]   Social History   Socioeconomic History   Marital status: Married    Spouse name: Not on file   Number of children: Not on file   Years of education: Not on file   Highest education level: Not on file  Occupational History   Not on file  Social Needs   Financial resource strain: Not on file   Food insecurity    Worry: Not on file    Inability: Not on file   Transportation needs    Medical: Not on file    Non-medical: Not on file  Tobacco Use   Smoking status: Former Smoker    Packs/day: 2.00    Years: 20.00    Pack years: 40.00  Types: Cigarettes    Quit date: 07/06/1991    Years since quitting: 28.0   Smokeless tobacco: Never Used  Substance and Sexual Activity   Alcohol use: No    Comment: occassional   Drug use: No   Sexual activity: Not Currently    Birth control/protection: None  Lifestyle   Physical activity    Days per week: Not on file    Minutes per session: Not on file   Stress: Not on file  Relationships   Social connections    Talks on phone: Not on file    Gets together: Not on file    Attends religious service: Not on file    Active member of club or organization: Not on file    Attends meetings of clubs or organizations: Not on file    Relationship status: Not on file  Other Topics Concern   Not on file  Social History Narrative   Disabled due to chronic back pain     Family History:  The patient's family history includes Heart attack in his father.   Review of Systems:   Please see the history of present illness.     General:  No chills, fever, night sweats or weight changes. Positive for mouth pain.  Cardiovascular:  No chest pain, dyspnea on exertion, edema, orthopnea, palpitations, paroxysmal nocturnal dyspnea. Dermatological: No rash, lesions/masses Respiratory: No cough, dyspnea Urologic: No hematuria,  dysuria Abdominal:   No nausea, vomiting, diarrhea, bright red blood per rectum, melena, or hematemesis Neurologic:  No visual changes, wkns, changes in mental status.   All other systems reviewed and are otherwise negative except as noted above.   Physical Exam:    VS:  BP 132/78    Pulse (!) 55    Temp (!) 96.8 F (36 C)    Ht 5\' 11"  (1.803 m)    Wt 267 lb (121.1 kg)    BMI 37.24 kg/m    General: Well developed, well nourished,male appearing in no acute distress. Head: Normocephalic, atraumatic, sclera non-icteric, no xanthomas, nares are without discharge.  Neck: No carotid bruits. JVD not elevated.  Lungs: Respirations regular and unlabored, without wheezes or rales.  Heart: Regular rate and rhythm with occasional ectopic beats. No S3 or S4.  No murmur, no rubs, or gallops appreciated. Abdomen: Soft, non-tender, non-distended with normoactive bowel sounds. No hepatomegaly. No rebound/guarding. No obvious abdominal masses. Msk:  Strength and tone appear normal for age. No joint deformities or effusions. Extremities: No clubbing or cyanosis. Trace ankle edema bilaterally.  Distal pedal pulses are 2+ bilaterally. Neuro: Alert and oriented X 3. Moves all extremities spontaneously. No focal deficits noted. Psych:  Responds to questions appropriately with a normal affect. Skin: No rashes or lesions noted  Wt Readings from Last 3 Encounters:  07/26/19 267 lb (121.1 kg)  10/29/18 257 lb 12.8 oz (116.9 kg)  09/24/18 257 lb (116.6 kg)     Studies/Labs Reviewed:   EKG:  EKG is ordered today.  The ekg ordered today demonstrates NSR, HR 60 with frequent PAC's. TWI along inferior leads.   Recent Labs: No results found for requested labs within last 8760 hours.   Lipid Panel    Component Value Date/Time   CHOL 139 06/07/2018 1400   TRIG 127 06/07/2018 1400   HDL 35 (L) 06/07/2018 1400   CHOLHDL 4.0 06/07/2018 1400   CHOLHDL 5.7 04/25/2018 0358   VLDL 25 04/25/2018 0358   LDLCALC  79 06/07/2018 1400  Additional studies/ records that were reviewed today include:   Cardiac Catheterization: 03/2018  Prox RCA lesion is 95% stenosed.  Dist RCA-2 lesion is 100% stenosed.  Dist RCA-1 lesion is 99% stenosed.  Ost 1st Mrg lesion is 100% stenosed.  Lat 1st Mrg lesion is 100% stenosed.  Ost LAD to Prox LAD lesion is 50% stenosed.  Mid LAD-1 lesion is 75% stenosed. This is seen best in teh LAO caudal view.  Mid LAD-2 lesion is 50% stenosed.  There is mild left ventricular systolic dysfunction.  The left ventricular ejection fraction is 45-50% by visual estimate.  LV end diastolic pressure is normal.  There is no aortic valve stenosis.   Severe three vessel disease including occlusion of both prior stented segments.  De novo LAD lesion, beset seen in the LAD caudal view, and clearly worse from prior cath.  Cardiac surgery consult.    Will admit since he reports pain at rest.  Will hold off on heparin for now given prior platelet issues.  May need to start if pain occurs.   Echocardiogram: 03/2018 Study Conclusions  - Left ventricle: The cavity size was normal. Systolic function was   mildly reduced. The estimated ejection fraction was in the range   of 45% to 50%. Hypokinesis of the inferolateral, anterior and   anteroseptal, and anterolateral myocardium. Doppler parameters   are consistent with abnormal left ventricular relaxation (grade 1   diastolic dysfunction). Doppler parameters are consistent with   high ventricular filling pressure. - Aortic valve: Transvalvular velocity was within the normal range.   There was no stenosis. There was mild regurgitation directed   towards the mitral anterior leaflet. Regurgitation pressure   half-time: 887 ms. - Aorta: Aortic root dimension: 39 mm (ED). - Ascending aorta: The ascending aorta was mildly dilated. - Mitral valve: Transvalvular velocity was within the normal range.   There was no evidence for  stenosis. There was trivial   regurgitation. - Left atrium: The atrium was severely dilated. - Right ventricle: The cavity size was normal. Wall thickness was   normal. Systolic function was normal. - Tricuspid valve: There was trivial regurgitation. - Pulmonary arteries: Systolic pressure was within the normal   range.   Assessment:    1. Coronary artery disease of native artery of native heart with stable angina pectoris ([place])   2. Postoperative atrial fibrillation (HCC)   3. Essential hypertension   4. Hyperlipidemia LDL goal <70   5. Preop cardiovascular exam      Plan:   In order of problems listed above:  1. CAD - s/p prior stenting of LCx and RCA in 2003 with stenting of OM in 2012. Underwent CABG on 04/27/2018 with LIMA-LAD, SVG-OM1, and SVG-OM2.  - He reports occasional episodes of a tingling sensation along his left pectoral region but denies any recent chest pain or dyspnea on exertion when performing routine activities. Says his breathing is the best it has been in several years. He has not had to utilize sublingual nitroglycerin. EKG today does show TWI along the inferior leads which is consistent with his known RCA anatomy by prior cath as outlined above. Given no recent anginal symptoms and his overall low-risk upcoming procedure, would not anticipate repeat ischemic testing at this time.  - continue ASA 81mg  daily, BB, and statin therapy.  2. Post-Operative Atrial Fibrillation - Occurred following CABG but no known recurrence since. He does have an [employer] monitor and says this sometimes reads as irregular. I encouraged him  to save those strips and send for review via MyChart if recurrence. He did have PAC's by EKG today so perhaps this is what his monitor is recording.   3. HTN - BP initially reported as being elevated at 163/78 (checked by machine) but rechecked manually and at 132/78. He reports this has overall been well controlled at home as well. Continue  current medication regimen at this time with Lisinopril 10 mg daily and Lopressor 50 mg twice daily.  4. HLD - Followed by PCP. He reports fasting labs were just obtained earlier this morning. Results are pending at this time. Goal LDL is less than 70 in the setting of known CAD. Continue Crestor 40 mg daily.  5. Preoperative Cardiac Clearance for Dental Extraction - Given that this is being performed with local anesthetic, the procedure itself is low-risk from a cardiac perspective. Even if anesthesia were to be required his RCRI risk is overall low at 0.9% risk of a major cardiac event. Typically ASA would not need to be held prior to his dental procedure but he does have known ITP (CBC performed by PCP today with results currently pending). Given no recent stent placement, ASA could be held 5 days prior to the procedure if indicated.    Medication Adjustments/Labs and Tests Ordered: Current medicines are reviewed at length with the patient today.  Concerns regarding medicines are outlined above.  Medication changes, Labs and Tests ordered today are listed in the Patient Instructions below. Patient Instructions  Medication Instructions: Your physician recommends that you continue on your current medications as directed. Please refer to the Current Medication list given to you today.   Labwork: None today  Procedures/Testing: None today  Follow-Up: 6 months with Dr.Nishan  Any Additional Special Instructions Will Be Listed Below (If Applicable).  If you need a refill on your cardiac medications before your next appointment, please call your pharmacy.   Thank you for choosing [facility] !         Signed, Erma Heritage, PA-C  07/26/2019 2:29 PM    [facility] [address] [place], [place] [address] Phone: [phone] Fax: [phone]

## 2019-07-27 ENCOUNTER — Other Ambulatory Visit (INDEPENDENT_AMBULATORY_CARE_PROVIDER_SITE_OTHER): Payer: Medicare Other | Admitting: *Deleted

## 2019-07-27 DIAGNOSIS — Z23 Encounter for immunization: Secondary | ICD-10-CM | POA: Diagnosis not present

## 2019-07-27 LAB — BASIC METABOLIC PANEL
BUN/Creatinine Ratio: 12 (ref 10–24)
BUN: 15 mg/dL (ref 8–27)
CO2: 23 mmol/L (ref 20–29)
Calcium: 8.8 mg/dL (ref 8.6–10.2)
Chloride: 103 mmol/L (ref 96–106)
Creatinine, Ser: 1.25 mg/dL (ref 0.76–1.27)
GFR calc Af Amer: 68 mL/min/{1.73_m2} (ref >59)
GFR calc non Af Amer: 59 mL/min/{1.73_m2} — ABNORMAL LOW (ref >59)
Glucose: 100 mg/dL — ABNORMAL HIGH (ref 65–99)
Potassium: 4.8 mmol/L (ref 3.5–5.2)
Sodium: 142 mmol/L (ref 134–144)

## 2019-07-27 LAB — CBC WITH DIFFERENTIAL/PLATELET
Basophils Absolute: 0 10*3/uL (ref 0.0–0.2)
Basos: 1 %
EOS (ABSOLUTE): 0.2 10*3/uL (ref 0.0–0.4)
Eos: 4 %
Hematocrit: 40.8 % (ref 37.5–51.0)
Hemoglobin: 13.4 g/dL (ref 13.0–17.7)
Immature Grans (Abs): 0 10*3/uL (ref 0.0–0.1)
Immature Granulocytes: 0 %
Lymphocytes Absolute: 1 10*3/uL (ref 0.7–3.1)
Lymphs: 18 %
MCH: 30 pg (ref 26.6–33.0)
MCHC: 32.8 g/dL (ref 31.5–35.7)
MCV: 91 fL (ref 79–97)
Monocytes Absolute: 0.4 10*3/uL (ref 0.1–0.9)
Monocytes: 7 %
Neutrophils Absolute: 3.8 10*3/uL (ref 1.4–7.0)
Neutrophils: 70 %
Platelets: 145 10*3/uL — ABNORMAL LOW (ref 150–450)
RBC: 4.47 x10E6/uL (ref 4.14–5.80)
RDW: 13 % (ref 11.6–15.4)
WBC: 5.4 10*3/uL (ref 3.4–10.8)

## 2019-07-27 LAB — LIPID PANEL
Chol/HDL Ratio: 4 ratio (ref 0.0–5.0)
Cholesterol, Total: 163 mg/dL (ref 100–199)
HDL: 41 mg/dL (ref >39)
LDL Chol Calc (NIH): 95 mg/dL (ref 0–99)
Triglycerides: 153 mg/dL — ABNORMAL HIGH (ref 0–149)
VLDL Cholesterol Cal: 27 mg/dL (ref 5–40)

## 2019-07-27 LAB — HEPATIC FUNCTION PANEL
ALT: 11 IU/L (ref 0–44)
AST: 15 IU/L (ref 0–40)
Albumin: 4.3 g/dL (ref 3.8–4.8)
Alkaline Phosphatase: 80 IU/L (ref 39–117)
Bilirubin Total: 0.3 mg/dL (ref 0.0–1.2)
Bilirubin, Direct: 0.1 mg/dL (ref 0.00–0.40)
Total Protein: 6.5 g/dL (ref 6.0–8.5)

## 2019-07-27 LAB — TSH: TSH: 0.749 u[IU]/mL (ref 0.450–4.500)

## 2019-07-27 LAB — HEPATITIS C ANTIBODY: Hep C Virus Ab: <0.1 s/co ratio (ref 0.0–0.9)

## 2019-07-29 ENCOUNTER — Other Ambulatory Visit: Payer: Self-pay | Admitting: Family Medicine

## 2019-07-30 DIAGNOSIS — R6889 Other general symptoms and signs: Secondary | ICD-10-CM | POA: Diagnosis not present

## 2019-07-31 DIAGNOSIS — R6889 Other general symptoms and signs: Secondary | ICD-10-CM | POA: Diagnosis not present

## 2019-09-23 ENCOUNTER — Encounter: Payer: Self-pay | Admitting: Family Medicine

## 2019-09-23 MED ORDER — TAMSULOSIN HCL 0.4 MG PO CAPS: ORAL_CAPSULE | ORAL | 4 refills | Status: DC

## 2019-09-23 NOTE — Addendum Note (Signed)
Addended by: Vicente Males on: 09/23/2019 03:57 PM   Modules accepted: Orders

## 2019-09-23 NOTE — Telephone Encounter (Signed)
Nurses  Apparently patient have a difficult time with urinary flow It would be okay for patient to take 2 of the tamsulosin each evening Please send in Flomax 0.4 mg, 2 nightly, #60, 4 refills The patient needs to pay close attention-if he is having wooziness dizziness or lightheadedness with the increased dose he will have to go back to just 1 and let us know  Also if taking 2 each evening is not helping urinary flow that is a sign we will need to help set him up with urology

## 2019-10-31 ENCOUNTER — Other Ambulatory Visit: Payer: Self-pay | Admitting: Family Medicine

## 2019-11-10 ENCOUNTER — Encounter: Payer: Self-pay | Admitting: Family Medicine

## 2019-11-11 ENCOUNTER — Ambulatory Visit (INDEPENDENT_AMBULATORY_CARE_PROVIDER_SITE_OTHER): Payer: Medicare Other | Admitting: Family Medicine

## 2019-11-11 DIAGNOSIS — J019 Acute sinusitis, unspecified: Secondary | ICD-10-CM

## 2019-11-11 MED ORDER — AZITHROMYCIN 250 MG PO TABS: ORAL_TABLET | ORAL | 0 refills | Status: DC

## 2019-11-11 NOTE — Progress Notes (Signed)
   Subjective:    Patient ID: [name], male    DOB: 14-Oct-1951, 69 y.o.   MRN: [id number]  Cough This is a new problem. Episode onset: 2 days  Associated symptoms include rhinorrhea. Pertinent negatives include no chest pain, chills, ear pain, fever or wheezing. Associated symptoms comments: Congestion, chills, low grade fever, headache, sneeze. Treatments tried: Tylenol  The treatment provided mild relief.  Pt wife had these symptoms and pt believed to have picked this up from her. Pt wife had negative COVID test.  Patient's wife got sick now he is sick relates head congestion drainage coughing sore throat not feeling good I did relate to the patient that it is possibility that he could be having Covid related symptoms yet still have negative test with his wife so therefore very important for him to move forward with doing Covid testing he denies any chest tightness pressure pain or shortness of breath,. Virtual Visit via Telephone Note  I connected with [name] on 11/11/19 at  9:30 AM EST by telephone and verified that I am speaking with the correct person using two identifiers.  Location: Patient: home Provider: office   I discussed the limitations, risks, security and privacy concerns of performing an evaluation and management service by telephone and the availability of in person appointments. I also discussed with the patient that there may be a patient responsible charge related to this service. The patient expressed understanding and agreed to proceed.   History of Present Illness:    Observations/Objective:   Assessment and Plan:   Follow Up Instructions:    I discussed the assessment and treatment plan with the patient. The patient was provided an opportunity to ask questions and all were answered. The patient agreed with the plan and demonstrated an understanding of the instructions.   The patient was advised to call back or seek an in-person evaluation if the  symptoms worsen or if the condition fails to improve as anticipated.  I provided 17 minutes of non-face-to-face time during this encounter.   Vicente Males, LPN     Review of Systems  Constitutional: Negative for activity change, chills and fever.  HENT: Positive for congestion and rhinorrhea. Negative for ear pain.   Eyes: Negative for discharge.  Respiratory: Positive for cough. Negative for wheezing.   Cardiovascular: Negative for chest pain.  Gastrointestinal: Negative for nausea and vomiting.  Musculoskeletal: Negative for arthralgias.       Objective:   Physical Exam  Today's visit was via telephone Physical exam was not possible for this visit  Any respiratory illness with this patient is concerning and does need Covid testing rationale discussed     Assessment & Plan:  Acute rhinosinusitis antibiotic prescribed warning signs discussed Very important for the patient do the best he can at doing Covid testing tomorrow Await the results of this If he gets chest congestion shortness of breath or worse immediately connect with [place] If severe shortness of breath go to ER

## 2019-11-12 ENCOUNTER — Other Ambulatory Visit: Payer: Self-pay

## 2019-11-12 ENCOUNTER — Ambulatory Visit: Payer: Medicare Other | Attending: Internal Medicine

## 2019-11-12 DIAGNOSIS — Z20822 Contact with and (suspected) exposure to covid-19: Secondary | ICD-10-CM

## 2019-11-13 LAB — NOVEL CORONAVIRUS, NAA: SARS-CoV-2, NAA: NOT DETECTED

## 2019-11-28 ENCOUNTER — Other Ambulatory Visit: Payer: Self-pay | Admitting: Family Medicine

## 2019-11-29 NOTE — Telephone Encounter (Signed)
lvm to schedule virtual   

## 2019-11-29 NOTE — Telephone Encounter (Signed)
May have 90-day with 1 refill needs office visit later in the spring

## 2019-11-29 NOTE — Telephone Encounter (Signed)
Please schedule and then route back to nurses 

## 2019-12-04 NOTE — Telephone Encounter (Signed)
Patient scheduled appointment for medication refill but states doesn't need any at the moment.

## 2019-12-16 ENCOUNTER — Other Ambulatory Visit: Payer: Self-pay | Admitting: Family Medicine

## 2019-12-30 ENCOUNTER — Other Ambulatory Visit: Payer: Self-pay | Admitting: Family Medicine

## 2020-01-13 ENCOUNTER — Encounter: Payer: Self-pay | Admitting: Family Medicine

## 2020-01-13 ENCOUNTER — Other Ambulatory Visit: Payer: Self-pay

## 2020-01-13 ENCOUNTER — Ambulatory Visit (INDEPENDENT_AMBULATORY_CARE_PROVIDER_SITE_OTHER): Payer: Medicare Other | Admitting: Family Medicine

## 2020-01-13 VITALS — BP 122/76 | Temp 97.4°F | Wt 224.2 lb

## 2020-01-13 DIAGNOSIS — R1084 Generalized abdominal pain: Secondary | ICD-10-CM | POA: Diagnosis not present

## 2020-01-13 NOTE — Telephone Encounter (Signed)
Called pt to get more information. He is having LLQ pain for 4 days. Did have fever one day but it was on the same day he got his covid vaccine so he thinks it was related to the vaccine. Consult with dr Nicki Reaper and pt needs face to face visit today and it was scheduled today at 4:10. Pt notified of appt.

## 2020-01-13 NOTE — Progress Notes (Signed)
   Subjective:    Patient ID: [name], male    DOB: 28-Sep-1951, 69 y.o.   MRN: [id number]  Abdominal Pain This is a new problem. Episode onset: 4 days ago  Pain location: lower abdomen  Associated symptoms comments: Loose/sticky stool; lower abdominal pain . Treatments tried: Immodium AD; Pepto BIsmol  The treatment provided no relief.  States he is patient states he is having some diarrhea abdominal pain worse in the right upper quadrant denies mucousy stools denies fever or chills he was concerned about the possibility of diverticulitis because he has a history of diverticula but he denies any lower quadrant pain.  No vomiting no difficulty breathing  Denies hematuria denies flank pain   Review of Systems  Gastrointestinal: Positive for abdominal pain.  Please see above denies chest pain respiratory distress sweats chills fevers     Objective:   Physical Exam  Lungs are clear respiratory rate normal heart is regular no murmurs abdomen is soft with left lower quadrant no pain or tenderness there  Does have mid abdominal tenderness and right upper quadrant tenderness      Assessment & Plan:  Will do stool testing for C. difficile will do lab work may need CAT scan await the results Tylenol as needed for fever bland diet if bloody stools vomiting or worse notify us immediately

## 2020-01-14 ENCOUNTER — Telehealth: Payer: Self-pay | Admitting: Family Medicine

## 2020-01-14 LAB — HEPATIC FUNCTION PANEL
ALT: 10 IU/L (ref 0–44)
AST: 13 IU/L (ref 0–40)
Albumin: 4.6 g/dL (ref 3.8–4.8)
Alkaline Phosphatase: 81 IU/L (ref 39–117)
Bilirubin Total: 0.4 mg/dL (ref 0.0–1.2)
Bilirubin, Direct: 0.11 mg/dL (ref 0.00–0.40)
Total Protein: 6.7 g/dL (ref 6.0–8.5)

## 2020-01-14 LAB — CBC WITH DIFFERENTIAL/PLATELET
Basophils Absolute: 0 10*3/uL (ref 0.0–0.2)
Basos: 1 %
EOS (ABSOLUTE): 0.2 10*3/uL (ref 0.0–0.4)
Eos: 3 %
Hematocrit: 40.4 % (ref 37.5–51.0)
Hemoglobin: 13.8 g/dL (ref 13.0–17.7)
Immature Grans (Abs): 0 10*3/uL (ref 0.0–0.1)
Immature Granulocytes: 0 %
Lymphocytes Absolute: 0.9 10*3/uL (ref 0.7–3.1)
Lymphs: 19 %
MCH: 29.9 pg (ref 26.6–33.0)
MCHC: 34.2 g/dL (ref 31.5–35.7)
MCV: 87 fL (ref 79–97)
Monocytes Absolute: 0.6 10*3/uL (ref 0.1–0.9)
Monocytes: 13 %
Neutrophils Absolute: 3.1 10*3/uL (ref 1.4–7.0)
Neutrophils: 64 %
Platelets: 121 10*3/uL — ABNORMAL LOW (ref 150–450)
RBC: 4.62 x10E6/uL (ref 4.14–5.80)
RDW: 12.8 % (ref 11.6–15.4)
WBC: 4.9 10*3/uL (ref 3.4–10.8)

## 2020-01-14 LAB — BASIC METABOLIC PANEL
BUN/Creatinine Ratio: 10 (ref 10–24)
BUN: 12 mg/dL (ref 8–27)
CO2: 24 mmol/L (ref 20–29)
Calcium: 9.3 mg/dL (ref 8.6–10.2)
Chloride: 101 mmol/L (ref 96–106)
Creatinine, Ser: 1.18 mg/dL (ref 0.76–1.27)
GFR calc Af Amer: 73 mL/min/{1.73_m2} (ref >59)
GFR calc non Af Amer: 63 mL/min/{1.73_m2} (ref >59)
Glucose: 75 mg/dL (ref 65–99)
Potassium: 4.5 mmol/L (ref 3.5–5.2)
Sodium: 142 mmol/L (ref 134–144)

## 2020-01-14 LAB — LIPASE: Lipase: 21 U/L (ref 13–78)

## 2020-01-14 NOTE — Telephone Encounter (Signed)
See result note. I called and spoke with pt.  

## 2020-01-14 NOTE — Telephone Encounter (Signed)
Please see lab results regarding this patient

## 2020-01-15 ENCOUNTER — Telehealth: Payer: Self-pay | Admitting: Family Medicine

## 2020-01-15 ENCOUNTER — Ambulatory Visit: Payer: Medicare Other | Admitting: Family Medicine

## 2020-01-15 NOTE — Telephone Encounter (Signed)
Ok so noted 

## 2020-01-15 NOTE — Telephone Encounter (Signed)
Pt calling to let Dr. Nicki Reaper know he wont be able to come by today but he will do virtual visit tomorrow.

## 2020-01-16 ENCOUNTER — Ambulatory Visit (HOSPITAL_COMMUNITY)
Admission: RE | Admit: 2020-01-16 | Discharge: 2020-01-16 | Disposition: A | Discharge status: Home / Self Care | Payer: Medicare Other | Source: Ambulatory Visit | Attending: Family Medicine | Admitting: Family Medicine

## 2020-01-16 ENCOUNTER — Ambulatory Visit (INDEPENDENT_AMBULATORY_CARE_PROVIDER_SITE_OTHER): Payer: Medicare Other | Admitting: Family Medicine

## 2020-01-16 ENCOUNTER — Other Ambulatory Visit: Payer: Self-pay

## 2020-01-16 DIAGNOSIS — R197 Diarrhea, unspecified: Secondary | ICD-10-CM | POA: Diagnosis not present

## 2020-01-16 DIAGNOSIS — R109 Unspecified abdominal pain: Secondary | ICD-10-CM | POA: Diagnosis not present

## 2020-01-16 MED ORDER — IOHEXOL 300 MG/ML  SOLN
100.0000 mL | Freq: Once | INTRAMUSCULAR | Status: AC | PRN
  Administered 2020-01-16: 100 mL via INTRAVENOUS

## 2020-01-16 MED ORDER — IOHEXOL 9 MG/ML PO SOLN
ORAL | Status: AC
  Filled 2020-01-16: qty 1000

## 2020-01-16 NOTE — Progress Notes (Signed)
   Subjective:    Patient ID: [name], male    DOB: Mar 23, 1951, 69 y.o.   MRN: [id number]  Hyperlipidemia This is a chronic problem. Pertinent negatives include no chest pain or shortness of breath. Treatments tried: crestor 40mg . has lost about 60 lbs from walking and changing diet.  Patient with ongoing abdominal pain and discomfort.  He relates it has a fairly consistent pain sometimes worse than others.  Patient with some nausea with the pain does wake him up at night.  Lab work unrevealing. Pt states abdominal pain is about the same as when seen earlier in the week.   Virtual Visit via Telephone Note  I connected with DENIZ TWEEDY on 01/16/20 at 10:00 AM EDT by telephone and verified that I am speaking with the correct person using two identifiers.  Location: Patient: home Provider: office   I discussed the limitations, risks, security and privacy concerns of performing an evaluation and management service by telephone and the availability of in person appointments. I also discussed with the patient that there may be a patient responsible charge related to this service. The patient expressed understanding and agreed to proceed.   History of Present Illness:    Observations/Objective:   Assessment and Plan:   Follow Up Instructions:    I discussed the assessment and treatment plan with the patient. The patient was provided an opportunity to ask questions and all were answered. The patient agreed with the plan and demonstrated an understanding of the instructions.   The patient was advised to call back or seek an in-person evaluation if the symptoms worsen or if the condition fails to improve as anticipated.  I provided 20 minutes of non-face-to-face time during this encounter.       Review of Systems  Constitutional: Negative for activity change, fatigue and fever.  HENT: Negative for congestion and rhinorrhea.   Respiratory: Negative for cough and shortness of  breath.   Cardiovascular: Negative for chest pain and leg swelling.  Gastrointestinal: Positive for abdominal pain and nausea. Negative for diarrhea.  Genitourinary: Negative for dysuria and hematuria.  Neurological: Negative for weakness and headaches.  Psychiatric/Behavioral: Negative for agitation and behavioral problems.       Objective:   Physical Exam Virtual exam unable to do physical       Assessment & Plan:  Ongoing abdominal pain and discomfort Not getting better over time Severity of pain bad enough to wake him up at night Pain moderate to severe and constant Given his symptomatology neck step would be is to go ahead with a stat CT scan of the abdomen pelvis to rule out the possibility of diverticulitis abscess or other intestinal condition including colitis  Also patient due for his other medication refills we will go ahead with that

## 2020-01-20 ENCOUNTER — Encounter: Payer: Self-pay | Admitting: Family Medicine

## 2020-01-20 ENCOUNTER — Telehealth: Payer: Self-pay | Admitting: Family Medicine

## 2020-01-20 DIAGNOSIS — R1084 Generalized abdominal pain: Secondary | ICD-10-CM

## 2020-01-20 NOTE — Telephone Encounter (Signed)
Urgent referral ordered in Epic. Left message to return call to notify patient

## 2020-01-20 NOTE — Telephone Encounter (Signed)
Pt returned call and made aware referral has been placed.

## 2020-01-20 NOTE — Telephone Encounter (Signed)
Definitely go ahead with urgent referral to gastroenterology

## 2020-01-20 NOTE — Telephone Encounter (Signed)
Patient is giving update on stomach issues stating its not any better and requesting a referral to specialist.

## 2020-01-21 ENCOUNTER — Encounter (INDEPENDENT_AMBULATORY_CARE_PROVIDER_SITE_OTHER): Payer: Self-pay | Admitting: Gastroenterology

## 2020-01-22 NOTE — Progress Notes (Signed)
Cardiology Office Note    Date:  01/27/2020   ID:  [name], DOB [birth year], MRN [id number]  PCP:  Oscar Drown, MD  Cardiologist: Oscar Rouge, MD    No chief complaint on file.   History of Present Illness:    [name] is a 69 y.o. male with past medical history of CAD (s/p prior stenting of LCx and RCA in 2003, stenting of OM in 2012, CABG on 04/27/2018 with LIMA-LAD, SVG-OM1, and SVG-OM2), post-operative atrial fibrillation (occurring at time of CABG), HTN, HLD, Hypothyroidism and ITP   Compliant with meds No angina  He has not had to utilize sublingual nitroglycerin. He denies any specific palpitations, orthopnea, PND, or lower extremity edema.    Seen by PA 07/26/19 and cleared to have dentures and multiple teeth extracted Seen by primary [place] for abdominal pain and diarhea 01/16/20 CT abdomen negative  C diff  And stool cultures pending WBC normal   Lost 50 lbs and feels great. Off narcotics and back pain better Had dentures fitted 6 months ago and couldn't Eat well for a while.   Has a cabin in [place] and going there soon  No angina has SL nitro   Past Medical History:  Diagnosis Date  . Allergy   . Carpal tunnel syndrome   . Chronic back pain   . Chronic combined systolic and diastolic congestive heart failure ([facility])   . Chronic ITP (idiopathic thrombocytopenia) ([facility]) 05/25/2015  . Coronary artery disease   . Coronary artery disease involving native coronary artery of native heart with angina pectoris ([place])   . Degenerative disc disease   . GERD (gastroesophageal reflux disease)   . History of thrombocytopenia   . Hypercholesteremia   . Hypertension   . Hypothyroid   . Lumbar pain   . Myocardial infarction ([place]) 2009  . Obesity   . S/P CABG x 3 04/27/2018   LIMA to LAD SVG to OM1 SVG to OM2  . Shortness of breath dyspnea     Past Surgical History:  Procedure Laterality Date  . BACK SURGERY     5 lumbas disc with cervical and lumbar fusions   . BIOPSY N/A 03/06/2014   Procedure: ESOPHAGEAL BIOPSIES;  Surgeon: Oscar Houston, MD;  Location: [facility];  Service: Endoscopy;  Laterality: N/A;  . COLONOSCOPY    . COLONOSCOPY WITH PROPOFOL N/A 03/06/2014   Procedure: COLONOSCOPY WITH PROPOFOL;  Surgeon: Oscar Houston, MD;  Location: [facility];  Service: Endoscopy;  Laterality: N/A;  in cecum at 0807; total withdrawal time 9 minutes  . CORONARY ANGIOPLASTY WITH STENT PLACEMENT     2000, and 2004 has 3 stents  . CORONARY ARTERY BYPASS GRAFT N/A 04/27/2018   Procedure: CORONARY ARTERY BYPASS GRAFTING (CABG) x Three , using left internal mammary artery and right leg greater saphenous vein;  Surgeon: Oscar Alberts, MD;  Location: [facility];  Service: Open Heart Surgery;  Laterality: N/A;  . ESOPHAGOGASTRODUODENOSCOPY (EGD) WITH PROPOFOL N/A 03/06/2014   Procedure: ESOPHAGOGASTRODUODENOSCOPY (EGD) WITH PROPOFOL;  Surgeon: Oscar Houston, MD;  Location: [facility];  Service: Endoscopy;  Laterality: N/A;  . LEFT HEART CATH AND CORONARY ANGIOGRAPHY N/A 04/24/2018   Procedure: LEFT HEART CATH AND CORONARY ANGIOGRAPHY;  Surgeon: [facility] Booze, MD;  Location: [facility];  Service: Cardiovascular;  Laterality: N/A;  . LUMBAR FUSION  2009  . MALONEY DILATION N/A 03/06/2014   Procedure: MALONEY DILATION 54 french;  Surgeon: [name]  Laural Golden, MD;  Location: [facility];  Service: Endoscopy;  Laterality: N/A;  . NECK SURGERY    . TEE WITHOUT CARDIOVERSION N/A 04/27/2018   Procedure: TRANSESOPHAGEAL ECHOCARDIOGRAM (TEE);  Surgeon: Oscar Alberts, MD;  Location: [facility];  Service: Open Heart Surgery;  Laterality: N/A;    Current Medications: Outpatient Medications Prior to Visit  Medication Sig Dispense Refill  . albuterol (PROVENTIL HFA;VENTOLIN HFA) 108 (90 Base) MCG/ACT inhaler Inhale 2 puffs into the lungs every 6 (six) hours as needed for wheezing or shortness of breath. 1 Inhaler 0  . aspirin EC 81 MG EC tablet Take 1 tablet (81 mg total) by mouth daily.     . citalopram (CELEXA) 20 MG tablet Take 1 tablet (20 mg total) by mouth every morning. 90 tablet 1  . levothyroxine (SYNTHROID) 150 MCG tablet TAKE 1 TABLET BY MOUTH DAILY BEFORE BREAKFAST. 90 tablet 1  . lisinopril (ZESTRIL) 10 MG tablet Take 1 tablet (10 mg total) by mouth daily. 90 tablet 1  . metoprolol tartrate (LOPRESSOR) 50 MG tablet Take 1 tablet (50 mg total) by mouth 2 (two) times daily. 60 tablet 5  . Multiple Vitamin (MULTIVITAMIN) capsule Take 1 capsule by mouth daily.      . pantoprazole (PROTONIX) 40 MG tablet TAKE 1 TABLET BY MOUTH ONCE A DAY. 90 tablet 0  . potassium chloride (K-DUR) 10 MEQ tablet Take 1 tablet (10 mEq total) by mouth daily. 90 tablet 1  . rosuvastatin (CRESTOR) 40 MG tablet TAKE 1 TABLET BY MOUTH EVERY EVENING 90 tablet 0  . tamsulosin (FLOMAX) 0.4 MG CAPS capsule TAKE 2 CAPSULE BY MOUTH AT NIGHT 60 capsule 4  . torsemide (DEMADEX) 20 MG tablet TAKE 2 TABLETS BY MOUTH DAILY. 180 tablet 0   No facility-administered medications prior to visit.     Allergies:   Penicillins, Sulfa antibiotics, and Zoloft [sertraline hcl]   Social History   Socioeconomic History  . Marital status: Married    Spouse name: Not on file  . Number of children: Not on file  . Years of education: Not on file  . Highest education level: Not on file  Occupational History  . Not on file  Tobacco Use  . Smoking status: Former Smoker    Packs/day: 2.00    Years: 20.00    Pack years: 40.00    Types: Cigarettes    Quit date: 07/06/1991    Years since quitting: 28.5  . Smokeless tobacco: Never Used  Substance and Sexual Activity  . Alcohol use: No    Comment: occassional  . Drug use: No  . Sexual activity: Not Currently    Birth control/protection: None  Other Topics Concern  . Not on file  Social History Narrative   Disabled due to chronic back pain   Social Determinants of Health   Financial Resource Strain:   . Difficulty of Paying Living Expenses:   Food  Insecurity:   . Worried About Charity fundraiser in the Last Year:   . Arboriculturist in the Last Year:   Transportation Needs:   . Film/video editor (Medical):   Marland Kitchen Lack of Transportation (Non-Medical):   Physical Activity:   . Days of Exercise per Week:   . Minutes of Exercise per Session:   Stress:   . Feeling of Stress :   Social Connections:   . Frequency of Communication with Friends and Family:   . Frequency of Social Gatherings with Friends and Family:   .  Attends Religious Services:   . Active Member of Clubs or Organizations:   . Attends Archivist Meetings:   Marland Kitchen Marital Status:      Family History:  The patient's family history includes Heart attack in his father.   Review of Systems:   Please see the history of present illness.     General:  No chills, fever, night sweats or weight changes. Positive for mouth pain.  Cardiovascular:  No chest pain, dyspnea on exertion, edema, orthopnea, palpitations, paroxysmal nocturnal dyspnea. Dermatological: No rash, lesions/masses Respiratory: No cough, dyspnea Urologic: No hematuria, dysuria Abdominal:   No nausea, vomiting, diarrhea, bright red blood per rectum, melena, or hematemesis Neurologic:  No visual changes, wkns, changes in mental status.   All other systems reviewed and are otherwise negative except as noted above.   Physical Exam:    VS:  BP 126/72 (BP Location: Left Arm)   Pulse 71   Temp 98.3 F (36.8 C)   Ht 6\' 1"  (1.854 m)   Wt 220 lb (99.8 kg)   SpO2 96%   BMI 29.03 kg/m    Affect appropriate Healthy:  appears stated age 16: normal Neck supple with no adenopathy JVP normal no bruits no thyromegaly Lungs clear with no wheezing and good diaphragmatic motion Heart:  S1/S2 no murmur, no rub, gallop or click PMI normal post sternotomy  Abdomen: benighn, BS positve, no tenderness, no AAA no bruit.  No HSM or HJR Distal pulses intact with no bruits No edema Neuro non-focal Skin  warm and dry No muscular weakness   Wt Readings from Last 3 Encounters:  01/27/20 220 lb (99.8 kg)  01/13/20 224 lb 3.2 oz (101.7 kg)  07/26/19 267 lb (121.1 kg)     Studies/Labs Reviewed:   EKG:  EKG is ordered today.  The ekg ordered today demonstrates NSR, HR 60 with frequent PAC's. TWI along inferior leads.   Recent Labs: 07/26/2019: TSH 0.749 01/13/2020: ALT 10; BUN 12; Creatinine, Ser 1.18; Hemoglobin 13.8; Platelets 121; Potassium 4.5; Sodium 142   Lipid Panel    Component Value Date/Time   CHOL 163 07/26/2019 0927   TRIG 153 (H) 07/26/2019 0927   HDL 41 07/26/2019 0927   CHOLHDL 4.0 07/26/2019 0927   CHOLHDL 5.7 04/25/2018 0358   VLDL 25 04/25/2018 0358   LDLCALC 95 07/26/2019 0927    Additional studies/ records that were reviewed today include:   Cardiac Catheterization: 03/2018  Prox RCA lesion is 95% stenosed.  Dist RCA-2 lesion is 100% stenosed.  Dist RCA-1 lesion is 99% stenosed.  Ost 1st Mrg lesion is 100% stenosed.  Lat 1st Mrg lesion is 100% stenosed.  Ost LAD to Prox LAD lesion is 50% stenosed.  Mid LAD-1 lesion is 75% stenosed. This is seen best in teh LAO caudal view.  Mid LAD-2 lesion is 50% stenosed.  There is mild left ventricular systolic dysfunction.  The left ventricular ejection fraction is 45-50% by visual estimate.  LV end diastolic pressure is normal.  There is no aortic valve stenosis.   Severe three vessel disease including occlusion of both prior stented segments.  De novo LAD lesion, beset seen in the LAD caudal view, and clearly worse from prior cath.  Cardiac surgery consult.    Will admit since he reports pain at rest.  Will hold off on heparin for now given prior platelet issues.  May need to start if pain occurs.   Echocardiogram: 03/2018 Study Conclusions  - Left ventricle: The  cavity size was normal. Systolic function was   mildly reduced. The estimated ejection fraction was in the range   of 45% to 50%.  Hypokinesis of the inferolateral, anterior and   anteroseptal, and anterolateral myocardium. Doppler parameters   are consistent with abnormal left ventricular relaxation (grade 1   diastolic dysfunction). Doppler parameters are consistent with   high ventricular filling pressure. - Aortic valve: Transvalvular velocity was within the normal range.   There was no stenosis. There was mild regurgitation directed   towards the mitral anterior leaflet. Regurgitation pressure   half-time: 887 ms. - Aorta: Aortic root dimension: 39 mm (ED). - Ascending aorta: The ascending aorta was mildly dilated. - Mitral valve: Transvalvular velocity was within the normal range.   There was no evidence for stenosis. There was trivial   regurgitation. - Left atrium: The atrium was severely dilated. - Right ventricle: The cavity size was normal. Wall thickness was   normal. Systolic function was normal. - Tricuspid valve: There was trivial regurgitation. - Pulmonary arteries: Systolic pressure was within the normal   range.   Assessment:    No diagnosis found.   Plan:   In order of problems listed above:  1. CAD - s/p prior stenting of LCx and RCA in 2003 with stenting of OM in 2012. Underwent CABG on 04/27/2018 with LIMA-LAD, SVG-OM1, and SVG-OM2.  - continue ASA 81mg  daily, BB, and statin therapy.  2. Post-Operative Atrial Fibrillation - Occurred following CABG but no known recurrence since. Not on anticoagulation continue beta blocker   3. HTN - Continue current medication regimen at this time with Lisinopril 10 mg daily and Lopressor 50 mg twice daily. Stable   4. HLD - LDL 95 on 07/26/19 discussed diet continue high dose crestor   5. Thyroid - continue synthroid TSH normal 07/26/19   6. ITP - last PLT count 145 on 07/26/19 no bleeding issues f/u hematology    Labwork: None today  Procedures/Testing: None today  Follow-Up: 1 year     Signed, Oscar Rouge, MD  01/27/2020 1:47 PM     [facility] [address] [place], [place] [address] Phone: [phone] Fax: [phone]

## 2020-01-27 ENCOUNTER — Encounter: Payer: Self-pay | Admitting: Cardiovascular Disease

## 2020-01-27 ENCOUNTER — Other Ambulatory Visit: Payer: Self-pay

## 2020-01-27 ENCOUNTER — Ambulatory Visit (INDEPENDENT_AMBULATORY_CARE_PROVIDER_SITE_OTHER): Payer: Medicare Other | Admitting: Cardiovascular Disease

## 2020-01-27 VITALS — BP 126/72 | HR 71 | Temp 98.3°F | Ht 73.0 in | Wt 220.0 lb

## 2020-01-27 DIAGNOSIS — Z951 Presence of aortocoronary bypass graft: Secondary | ICD-10-CM | POA: Diagnosis not present

## 2020-01-27 NOTE — Patient Instructions (Signed)

## 2020-01-30 ENCOUNTER — Encounter: Payer: Self-pay | Admitting: Physician Assistant

## 2020-01-30 ENCOUNTER — Encounter: Payer: Self-pay | Admitting: Family Medicine

## 2020-01-30 ENCOUNTER — Telehealth: Payer: Self-pay | Admitting: Family Medicine

## 2020-01-30 DIAGNOSIS — R1084 Generalized abdominal pain: Secondary | ICD-10-CM

## 2020-01-30 NOTE — Telephone Encounter (Signed)
Please advise. Thank you

## 2020-01-30 NOTE — Telephone Encounter (Signed)
Patient is requesting new referral to get in with gastro doctor sooner because Dr. Laural Golden doesn't have anything until July he states ant wait that long and he found one in [place] to go to at [place] but needing order put in.

## 2020-01-30 NOTE — Telephone Encounter (Signed)
May go ahead with referral to gastroenterology

## 2020-01-30 NOTE — Telephone Encounter (Signed)
Referral placed and pt is aware. 

## 2020-01-31 ENCOUNTER — Encounter: Payer: Self-pay | Admitting: Family Medicine

## 2020-01-31 ENCOUNTER — Other Ambulatory Visit: Payer: Self-pay | Admitting: Family Medicine

## 2020-02-04 ENCOUNTER — Encounter: Payer: Self-pay | Admitting: Family Medicine

## 2020-02-04 NOTE — Telephone Encounter (Signed)
Called and discussed with pt. Pt states he still has containers but has not collected the samples but states he will and he will drop off at lab.

## 2020-02-04 NOTE — Telephone Encounter (Signed)
[name]  Previously we had ordered stool test for culture as well as C. difficile.  I see where this is listed as active?  Has patient collected these yet? I would not recommend any prescription antidiarrhea medicine unless these tests come back negative (So therefore needs to do the stool test soon)

## 2020-02-04 NOTE — Telephone Encounter (Signed)
Pt states he is having diarrhea immediately after eating. Takes immodium 2 -3 times a day. Seeing gi in [place] on the 16th because dr Laural Golden was booked til july

## 2020-02-12 ENCOUNTER — Other Ambulatory Visit: Payer: Self-pay | Admitting: Family Medicine

## 2020-02-14 ENCOUNTER — Ambulatory Visit: Payer: Medicare Other | Admitting: Physician Assistant

## 2020-03-02 ENCOUNTER — Other Ambulatory Visit: Payer: Self-pay | Admitting: Family Medicine

## 2020-03-05 ENCOUNTER — Ambulatory Visit: Payer: Medicare Other | Admitting: Physician Assistant

## 2020-03-05 ENCOUNTER — Encounter: Payer: Self-pay | Admitting: Physician Assistant

## 2020-03-05 ENCOUNTER — Other Ambulatory Visit (INDEPENDENT_AMBULATORY_CARE_PROVIDER_SITE_OTHER): Payer: Medicare Other

## 2020-03-05 ENCOUNTER — Telehealth: Payer: Self-pay

## 2020-03-05 VITALS — BP 124/80 | HR 63 | Temp 97.0°F | Ht 73.0 in | Wt 223.2 lb

## 2020-03-05 DIAGNOSIS — K8080 Other cholelithiasis without obstruction: Secondary | ICD-10-CM

## 2020-03-05 DIAGNOSIS — R1011 Right upper quadrant pain: Secondary | ICD-10-CM | POA: Diagnosis not present

## 2020-03-05 DIAGNOSIS — R194 Change in bowel habit: Secondary | ICD-10-CM | POA: Diagnosis not present

## 2020-03-05 LAB — CBC WITH DIFFERENTIAL/PLATELET
Basophils Absolute: 0 10*3/uL (ref 0.0–0.1)
Basophils Relative: 0.7 % (ref 0.0–3.0)
Eosinophils Absolute: 0.1 10*3/uL (ref 0.0–0.7)
Eosinophils Relative: 2.3 % (ref 0.0–5.0)
HCT: 40.8 % (ref 39.0–52.0)
Hemoglobin: 13.5 g/dL (ref 13.0–17.0)
Lymphocytes Relative: 14.7 % (ref 12.0–46.0)
Lymphs Abs: 0.7 10*3/uL (ref 0.7–4.0)
MCHC: 33 g/dL (ref 30.0–36.0)
MCV: 91.8 fl (ref 78.0–100.0)
Monocytes Absolute: 0.3 10*3/uL (ref 0.1–1.0)
Monocytes Relative: 6.6 % (ref 3.0–12.0)
Neutro Abs: 3.8 10*3/uL (ref 1.4–7.7)
Neutrophils Relative %: 75.7 % (ref 43.0–77.0)
Platelets: 117 10*3/uL — ABNORMAL LOW (ref 150.0–400.0)
RBC: 4.45 Mil/uL (ref 4.22–5.81)
RDW: 14.6 % (ref 11.5–15.5)
WBC: 5.1 10*3/uL (ref 4.0–10.5)

## 2020-03-05 MED ORDER — PANTOPRAZOLE SODIUM 40 MG PO TBEC: 40.0000 mg | DELAYED_RELEASE_TABLET | Freq: Two times a day (BID) | ORAL | 3 refills | Status: DC

## 2020-03-05 MED ORDER — SUTAB 1479-225-188 MG PO TABS: 1.0000 | ORAL_TABLET | ORAL | 0 refills | Status: DC

## 2020-03-05 NOTE — Progress Notes (Signed)
Subjective:    Patient ID: [name], male    DOB: 01-31-1951, 69 y.o.   MRN: [id number]  HPI [name] is a pleasant 69 year old white male, new to GI today, and referred by Dr. Sallee Burns for evaluation of right upper abdominal pain and change in bowel habits with looser stools. Patient had previously been evaluated by Dr. Laural Burns, and says that he had undergone prior upper endoscopy with esophageal dilation and colonoscopy both around 2015. He has history of hypertension, coronary artery disease status post CABG x3 in 2019, congestive heart failure with most recent EF at 45 to 50%, hypothyroidism, chronic ITP, chronic back pain and prior history of diverticulitis. He says his current symptoms have been present over the past 2 months.  He says symptoms will improve for a few days then recur.  He is describing right upper quadrant pain that is intermittent, sharp and may last for an hour or so.  Does not have any radiation into his back or up into his chest.  He he may experience some mild nausea but has not had any vomiting.  This pain has been present most days recently.  He had also had a change in his bowel habits.  He denies any real diarrhea but just says his stools are looser and messy and sticky.  He has not noticed any melena or hematochezia.  Appetite has been okay, he had lost about 50 pounds over the past year or so but this was intentional. He has been using Aleve for the right-sided abdominal pain recently  He underwent evaluation with labs on 01/16/2020 with normal CBC and chemistries CT of the abdomen and pelvis on 01/16/2020 + for cholelithiasis, no gallbladder wall thickening, no ductal dilation, there are multiple small hepatic cysts and spleen noted to be borderline in size, he has a few left-sided diverticuli but no diverticulitis.    Review of Systems Pertinent positive and negative review of systems were noted in the above HPI section.  All other review of systems was  otherwise negative.  Outpatient Encounter Medications as of 03/05/2020  Medication Sig  . albuterol (PROVENTIL HFA;VENTOLIN HFA) 108 (90 Base) MCG/ACT inhaler Inhale 2 puffs into the lungs every 6 (six) hours as needed for wheezing or shortness of breath.  Marland Kitchen aspirin EC 81 MG EC tablet Take 1 tablet (81 mg total) by mouth daily.  . citalopram (CELEXA) 20 MG tablet Take 1 tablet (20 mg total) by mouth every morning.  Marland Kitchen levothyroxine (SYNTHROID) 150 MCG tablet TAKE 1 TABLET BY MOUTH DAILY BEFORE BREAKFAST.  Marland Kitchen lisinopril (ZESTRIL) 10 MG tablet Take 1 tablet (10 mg total) by mouth daily.  . metoprolol tartrate (LOPRESSOR) 50 MG tablet TAKE 1 TABLET BY MOUTH TWICE DAILY.  . pantoprazole (PROTONIX) 40 MG tablet Take 1 tablet (40 mg total) by mouth 2 (two) times daily.  . potassium chloride (KLOR-CON) 10 MEQ tablet TAKE 1 TABLET BY MOUTH DAILY  . rosuvastatin (CRESTOR) 40 MG tablet TAKE 1 TABLET BY MOUTH EVERY EVENING  . tamsulosin (FLOMAX) 0.4 MG CAPS capsule TAKE 2 CAPSULE BY MOUTH AT NIGHT  . torsemide (DEMADEX) 20 MG tablet TAKE 2 TABLETS BY MOUTH DAILY.  . [DISCONTINUED] pantoprazole (PROTONIX) 40 MG tablet TAKE 1 TABLET BY MOUTH ONCE A DAY.  . Multiple Vitamin (MULTIVITAMIN) capsule Take 1 capsule by mouth daily.    . Sodium Sulfate-Mag Sulfate-KCl (SUTAB) [phone] MG TABS Take 1 kit by mouth as directed.  . [DISCONTINUED] Sodium Sulfate-Mag Sulfate-KCl ([place]) [phone]  MG TABS Take 1 kit by mouth as directed.   No facility-administered encounter medications on file as of 03/05/2020.   Allergies  Allergen Reactions  . Penicillins Other (See Comments)    SYNCOPAL EPISODE  Has patient had a PCN reaction causing immediate rash, facial/tongue/throat swelling, SOB or LIGHTHEADEDNESS with HYPOTENSION [SYNCOPE] #  #  #  YES  #  #  #  Has patient had a PCN reaction causing severe rash involving mucus membranes or skin necrosis:No Has patient had a PCN reaction that required hospitalization:No  Has patient had a PCN reaction occurring within the last 10 years:Yes   . Sulfa Antibiotics Swelling    SWELLING REACTION UNSPECIFIED > PER PREVIOUS PMH  . Zoloft [Sertraline Hcl] Other (See Comments)    Confusion    Patient Active Problem List   Diagnosis Date Noted  . S/P CABG x 3 04/27/2018  . Angina pectoris ([place]) 04/24/2018  . Obesity   . Chronic combined systolic and diastolic congestive heart failure ([place])   . Peripheral edema 06/30/2017  . Pedal edema 11/28/2016  . Osteopenia 08/13/2015  . Thrombocytopenia ([place]) 05/25/2015  . Chronic ITP (idiopathic thrombocytopenia) ([facility]) 05/25/2015  . Hypothyroidism 08/29/2014  . Diverticulitis of colon (without mention of hemorrhage)(562.11) 02/04/2014  . Dysphagia, unspecified(787.20) 02/04/2014  . Hyperlipidemia 11/18/2013  . Dyspnea 01/02/2012  . Palpitations 01/02/2012  . Mixed hyperlipidemia 07/06/2011  . Coronary artery disease involving native coronary artery of native heart with angina pectoris ([place])   . TOTAL KNEE FOLLOW-UP 08/14/2008  . Chronic back pain 07/23/2008  . KNEE, ARTHRITIS, DEGEN./OSTEO 05/29/2008  . JOINT EFFUSION, KNEE 04/30/2008  . Pain in joint, lower leg 10/10/2007  . Essential hypertension 10/09/2007   Social History   Socioeconomic History  . Marital status: Married    Spouse name: Not on file  . Number of children: Not on file  . Years of education: Not on file  . Highest education level: Not on file  Occupational History  . Occupation: retireed  Tobacco Use  . Smoking status: Former Smoker    Packs/day: 2.00    Years: 20.00    Pack years: 40.00    Types: Cigarettes    Quit date: 07/06/1991    Years since quitting: 28.6  . Smokeless tobacco: Never Used  Substance and Sexual Activity  . Alcohol use: No    Comment: occassional  . Drug use: No  . Sexual activity: Not Currently    Birth control/protection: None  Other Topics Concern  . Not on file  Social History Narrative   Disabled due  to chronic back pain   Social Determinants of Health   Financial Resource Strain:   . Difficulty of Paying Living Expenses:   Food Insecurity:   . Worried About Charity fundraiser in the Last Year:   . Arboriculturist in the Last Year:   Transportation Needs:   . Film/video editor (Medical):   Marland Kitchen Lack of Transportation (Non-Medical):   Physical Activity:   . Days of Exercise per Week:   . Minutes of Exercise per Session:   Stress:   . Feeling of Stress :   Social Connections:   . Frequency of Communication with Friends and Family:   . Frequency of Social Gatherings with Friends and Family:   . Attends Religious Services:   . Active Member of Clubs or Organizations:   . Attends Archivist Meetings:   Marland Kitchen Marital Status:   Intimate Partner Violence:   .  Fear of Current or Ex-Partner:   . Emotionally Abused:   Marland Kitchen Physically Abused:   . Sexually Abused:     Mr. Mccadden family history includes Colon cancer in his maternal grandmother; Heart attack in his father; Heart disease in his father.      Objective:    Vitals:   03/05/20 1035  BP: 124/80  Pulse: 63  Temp: (!) 97 F (36.1 C)    Physical Exam Well-developed well-nourished older white male in no acute distress.   Weight 223, BMI 29.45  HEENT; nontraumatic normocephalic, EOMI, PER R LA, sclera anicteric. Oropharynx; not examined today Neck; supple, no JVD Cardiovascular; regular rate and rhythm with S1-S2, no murmur rub or gallop ,sternal incisional scar Pulmonary; Clear bilaterally Abdomen; soft, he is tender in the epigastrium right upper quadrant and right mid quadrant nondistended, no palpable mass or hepatosplenomegaly, bowel sounds are active Rectal; not done today Skin; benign exam, no jaundice rash or appreciable lesions Extremities; no clubbing cyanosis or edema skin warm and dry Neuro/Psych; alert and oriented x4, grossly nonfocal mood and affect appropriate       Assessment & Plan:     #62  69 year old white male with 87-monthhistory of intermittent sharp right upper quadrant pain, occasional mild nausea and change in bowel habits with more frequent loose/messy stools. Etiology of symptoms is not entirely clear.  He does have some significant tenderness of the epigastrium right upper quadrant and right mid quadrant. Recent CT imaging positive for cholelithiasis without evidence for cholecystitis or CBD dilation  It is possible that he is having intermittent biliary colic, though with coinciding alteration in bowel habits ,also concerned about possible new colitis or microscopic colitis, and also need to rule out gastroduodenal pathology.  #2 coronary artery disease status post CABG times 12/2017 #3 congestive heart failure with EF 45 to 50% #4 chronic ITP-patient says he has not had any issues in quite some time.  He has been followed by Dr. SAlen Blew  If he develops thrombocytopenia he says he responds quickly to steroids #5 history of diverticulitis #6.  Hypertension #7 history of GERD and esophageal stricture with prior dilation #8 question history of colon polyps  Plan; Patient has signed a release and will obtain copies of his prior colonoscopy/EGD and path per Dr. RLaural Burns Patient will be scheduled for colonoscopy and EGD with Dr. DLoletha Carrow  Both procedures were discussed in detail with patient including indications risks and benefits and he is agreeable to proceed We will check CBC today to assure normal platelet count, and will also communicate with his hematologist Dr. SAlen Blewregarding any preprocedural recommendations. We will hold aspirin for 3 days prior to procedure. Continue Protonix 40 mg but increase to twice daily Add Benefiber once daily in juice or water. Patient has completed COVID-19 vaccination. Further recommendations pending results of endoscopic evaluation.  If no significant pathology to account for his right upper quadrant pains and will need surgical  consultation for possible cholecystectomy.  Amy SGenia HaroldPA-C 03/05/2020   Cc: LKathyrn Drown MD

## 2020-03-05 NOTE — Telephone Encounter (Signed)
No additional instructions needed.

## 2020-03-05 NOTE — Telephone Encounter (Signed)
[name] was seen in the office today and has been scheduled to have an Endoscopy and Colonoscopy on 03/17/20. He will be having a CBC with diff today and has been advised to hold his Asprin 3 day days prior to his procedures. Do we need to take any other precautions prior to his procedures due to his Chronic ITP?

## 2020-03-05 NOTE — Patient Instructions (Signed)
If you are age 69 or older, your body mass index should be between 23-30. Your Body mass index is 29.45 kg/m. If this is out of the aforementioned range listed, please consider follow up with your Primary Care Provider.  If you are age 86 or younger, your body mass index should be between 19-25. Your Body mass index is 29.45 kg/m. If this is out of the aformentioned range listed, please consider follow up with your Primary Care Provider.   You have been scheduled for an endoscopy and colonoscopy. Please follow the written instructions given to you at your visit today. Please pick up your prep supplies at the pharmacy within the next 1-3 days. If you use inhalers (even only as needed), please bring them with you on the day of your procedure.  Your provider has requested that you go to the basement level for lab work before leaving today. Press "B" on the elevator. The lab is located at the first door on the left as you exit the elevator.  Due to recent changes in healthcare laws, you may see the results of your imaging and laboratory studies on MyChart before your provider has had a chance to review them.  We understand that in some cases there may be results that are confusing or concerning to you. Not all laboratory results come back in the same time frame and the provider may be waiting for multiple results in order to interpret others.  Please give [place] 48 hours in order for your provider to thoroughly review all the results before contacting the office for clarification of your results.   STOP your Asprin 3 days prior to your Colonoscopy/Endoscopy.  INCREASE your Pantoprazole 40 mg to twice a day. A new script has been sent to your pharmacy.  START Benefiber as directed daily.  STOP using Aleve, use Tylenol instead.  Follow up pending the results of your procedures and as needed.

## 2020-03-05 NOTE — Telephone Encounter (Signed)
Amy Esterwood, PA-C notified.

## 2020-03-06 NOTE — Progress Notes (Signed)
____________________________________________________________  Attending physician addendum:  Thank you for sending this case to me. I have reviewed the entire note, and the outlined plan seems appropriate.  Agree endoscopic testing warranted with altered bowel habits before attributing pain to biliary colic.  Platelet count of 117K stable from 2 months prior and OK for procedures.  Wilfrid Lund, MD  ____________________________________________________________

## 2020-03-09 ENCOUNTER — Encounter: Payer: Self-pay | Admitting: Gastroenterology

## 2020-03-11 ENCOUNTER — Other Ambulatory Visit: Payer: Self-pay | Admitting: Family Medicine

## 2020-03-17 ENCOUNTER — Ambulatory Visit (AMBULATORY_SURGERY_CENTER): Payer: Medicare Other | Admitting: Gastroenterology

## 2020-03-17 ENCOUNTER — Other Ambulatory Visit: Payer: Self-pay

## 2020-03-17 ENCOUNTER — Encounter: Payer: Self-pay | Admitting: Gastroenterology

## 2020-03-17 VITALS — BP 128/71 | HR 51 | Temp 96.9°F | Resp 20 | Ht 73.0 in | Wt 223.0 lb

## 2020-03-17 DIAGNOSIS — K5730 Diverticulosis of large intestine without perforation or abscess without bleeding: Secondary | ICD-10-CM

## 2020-03-17 DIAGNOSIS — R194 Change in bowel habit: Secondary | ICD-10-CM | POA: Diagnosis not present

## 2020-03-17 DIAGNOSIS — D124 Benign neoplasm of descending colon: Secondary | ICD-10-CM | POA: Diagnosis not present

## 2020-03-17 DIAGNOSIS — R1011 Right upper quadrant pain: Secondary | ICD-10-CM | POA: Diagnosis not present

## 2020-03-17 DIAGNOSIS — D122 Benign neoplasm of ascending colon: Secondary | ICD-10-CM

## 2020-03-17 MED ORDER — SODIUM CHLORIDE 0.9 % IV SOLN: 500.0000 mL | Freq: Once | INTRAVENOUS | Status: DC

## 2020-03-17 NOTE — Progress Notes (Signed)
Called to room to assist during endoscopic procedure.  Patient ID and intended procedure confirmed with present staff. Received instructions for my participation in the procedure from the performing physician.  

## 2020-03-17 NOTE — Progress Notes (Signed)
A/ox3, pleased with MAC, report to RN 

## 2020-03-17 NOTE — Op Note (Signed)
[place] Patient Name: Archit Knoth Procedure Date: 03/17/2020 2:39 PM MRN: [id number] Endoscopist: Mallie Mussel L. Loletha Carrow , MD Age: 69 Referring MD:  Date of Birth: 1951/10/18 Gender: Male Account #: [id number] Procedure:                Colonoscopy Indications:              Change in bowel habits Medicines:                Monitored Anesthesia Care Procedure:                Pre-Anesthesia Assessment:                           - Prior to the procedure, a History and Physical                            was performed, and patient medications and                            allergies were reviewed. The patient's tolerance of                            previous anesthesia was also reviewed. The risks                            and benefits of the procedure and the sedation                            options and risks were discussed with the patient.                            All questions were answered, and informed consent                            was obtained. Prior Anticoagulants: The patient has                            taken no previous anticoagulant or antiplatelet                            agents except for aspirin (held 3 days). ASA Grade                            Assessment: III - A patient with severe systemic                            disease. After reviewing the risks and benefits,                            the patient was deemed in satisfactory condition to                            undergo the procedure.  After obtaining informed consent, the colonoscope                            was passed under direct vision. Throughout the                            procedure, the patient's blood pressure, pulse, and                            oxygen saturations were monitored continuously. The                            Colonoscope was introduced through the anus and                            advanced to the the cecum, identified by              appendiceal orifice and ileocecal valve. The                            colonoscopy was performed without difficulty. The                            patient tolerated the procedure well. The quality                            of the bowel preparation was good. The ileocecal                            valve, appendiceal orifice, and rectum were                            photographed. Scope In: 2:53:16 PM Scope Out: 3:10:19 PM Scope Withdrawal Time: 0 hours 12 minutes 40 seconds  Total Procedure Duration: 0 hours 17 minutes 3 seconds  Findings:                 The perianal and digital rectal examinations were                            normal.                           Multiple diverticula were found in the right colon.                           Three sessile polyps were found in the descending                            colon and ascending colon. The polyps were                            diminutive in size. These polyps were removed with  a cold snare. Resection and retrieval were complete.                           The exam was otherwise without abnormality on                            direct and retroflexion views. Complications:            No immediate complications. Estimated Blood Loss:     Estimated blood loss was minimal. Impression:               - Diverticulosis in the right colon.                           - Three diminutive polyps in the descending colon                            and in the ascending colon, removed with a cold                            snare. Resected and retrieved.                           - The examination was otherwise normal on direct                            and retroflexion views. Recommendation:           - Patient has a contact number available for                            emergencies. The signs and symptoms of potential                            delayed complications were discussed with the                             patient. Return to normal activities tomorrow.                            Written discharge instructions were provided to the                            patient.                           - Resume previous diet.                           - Continue present medications.                           - Await pathology results.                           - Repeat colonoscopy is recommended for  surveillance. The colonoscopy date will be                            determined after pathology results from today's                            exam become available for review. Issaiah Seabrooks L. Loletha Carrow, MD 03/17/2020 3:26:46 PM This report has been signed electronically.

## 2020-03-17 NOTE — Op Note (Signed)
[place] Patient Name: [name] Procedure Date: 03/17/2020 2:38 PM MRN: [id number] Endoscopist: Mallie Mussel L. Loletha Carrow , MD Age: 69 Referring MD:  Date of Birth: [birth year] Gender: Male Account #: [id number] Procedure:                Upper GI endoscopy Indications:              Abdominal pain in the right upper quadrant Medicines:                Monitored Anesthesia Care Procedure:                Pre-Anesthesia Assessment:                           - Prior to the procedure, a History and Physical                            was performed, and patient medications and                            allergies were reviewed. The patient's tolerance of                            previous anesthesia was also reviewed. The risks                            and benefits of the procedure and the sedation                            options and risks were discussed with the patient.                            All questions were answered, and informed consent                            was obtained. Prior Anticoagulants: The patient has                            taken no previous anticoagulant or antiplatelet                            agents except for aspirin (held 3 days). ASA Grade                            Assessment: III - A patient with severe systemic                            disease. After reviewing the risks and benefits,                            the patient was deemed in satisfactory condition to                            undergo the procedure.  After obtaining informed consent, the endoscope was                            passed under direct vision. Throughout the                            procedure, the patient's blood pressure, pulse, and                            oxygen saturations were monitored continuously. The                            Endoscope was introduced through the mouth, and                            advanced to the second part of  duodenum. The upper                            GI endoscopy was accomplished without difficulty.                            The patient tolerated the procedure well. Scope In: Scope Out: Findings:                 The esophagus was normal.                           The stomach was normal.                           The cardia and gastric fundus were normal on                            retroflexion.                           The examined duodenum was normal. Complications:            No immediate complications. Estimated Blood Loss:     Estimated blood loss: none. Impression:               - Normal esophagus.                           - Normal stomach.                           - Normal examined duodenum.                           - No specimens collected.                           RUQ pain from recently-discovered gallstones. Recommendation:           - Patient has a contact number available for  emergencies. The signs and symptoms of potential                            delayed complications were discussed with the                            patient. Return to normal activities tomorrow.                            Written discharge instructions were provided to the                            patient.                           - Resume previous diet.                           - Continue present medications.                           - See the other procedure note for documentation of                            additional recommendations.                           - Refer to a surgeon at appointment to be                            scheduled. (plan cholecystectomy) Mallie Mussel L. Loletha Carrow, MD 03/17/2020 3:29:19 PM This report has been signed electronically.

## 2020-03-17 NOTE — Patient Instructions (Signed)
Dr. Loletha Carrow' office nurse will call you with a referral to the surgeon  Please read over handouts about polyp, high fiber diets and diverticulosis  Continue your normal medications   YOU HAD AN ENDOSCOPIC PROCEDURE TODAY AT [facility]:   Refer to the procedure report that was given to you for any specific questions about what was found during the examination.  If the procedure report does not answer your questions, please call your gastroenterologist to clarify.  If you requested that your care partner not be given the details of your procedure findings, then the procedure report has been included in a sealed envelope for you to review at your convenience later.  YOU SHOULD EXPECT: Some feelings of bloating in the abdomen. Passage of more gas than usual.  Walking can help get rid of the air that was put into your GI tract during the procedure and reduce the bloating. If you had a lower endoscopy (such as a colonoscopy or flexible sigmoidoscopy) you may notice spotting of blood in your stool or on the toilet paper. If you underwent a bowel prep for your procedure, you may not have a normal bowel movement for a few days.  Please Note:  You might notice some irritation and congestion in your nose or some drainage.  This is from the oxygen used during your procedure.  There is no need for concern and it should clear up in a day or so.  SYMPTOMS TO REPORT IMMEDIATELY:   Following lower endoscopy (colonoscopy or flexible sigmoidoscopy):  Excessive amounts of blood in the stool  Significant tenderness or worsening of abdominal pains  Swelling of the abdomen that is new, acute  Fever of 100F or higher   Following upper endoscopy (EGD)  Vomiting of blood or coffee ground material  New chest pain or pain under the shoulder blades  Painful or persistently difficult swallowing  New shortness of breath  Fever of 100F or higher  Black, tarry-looking stools  For urgent or emergent  issues, a gastroenterologist can be reached at any hour by calling [phone]. Do not use MyChart messaging for urgent concerns.    DIET:  We do recommend a small meal at first, but then you may proceed to your regular diet.  Drink plenty of fluids but you should avoid alcoholic beverages for 24 hours.  ACTIVITY:  You should plan to take it easy for the rest of today and you should NOT DRIVE or use heavy machinery until tomorrow (because of the sedation medicines used during the test).    FOLLOW UP: Our staff will call the number listed on your records 48-72 hours following your procedure to check on you and address any questions or concerns that you may have regarding the information given to you following your procedure. If we do not reach you, we will leave a message.  We will attempt to reach you two times.  During this call, we will ask if you have developed any symptoms of COVID 19. If you develop any symptoms (ie: fever, flu-like symptoms, shortness of breath, cough etc.) before then, please call [phone].  If you test positive for Covid 19 in the 2 weeks post procedure, please call and report this information to [place].    If any biopsies were taken you will be contacted by phone or by letter within the next 1-3 weeks.  Please call us at [phone] if you have not heard about the biopsies in 3 weeks.  SIGNATURES/CONFIDENTIALITY: You and/or your care partner have signed paperwork which will be entered into your electronic medical record.  These signatures attest to the fact that that the information above on your After Visit Summary has been reviewed and is understood.  Full responsibility of the confidentiality of this discharge information lies with you and/or your care-partner.

## 2020-03-17 NOTE — Progress Notes (Signed)
Pt's states no medical or surgical changes since previsit or office visit. 

## 2020-03-18 ENCOUNTER — Telehealth: Payer: Self-pay

## 2020-03-18 NOTE — Progress Notes (Signed)
Referral faxed to [facility] as requested for possible gallbladder surgery.

## 2020-03-18 NOTE — Telephone Encounter (Signed)
Referral faxed to [facility] for possible gallbladder surgery. 

## 2020-03-19 ENCOUNTER — Telehealth: Payer: Self-pay | Admitting: *Deleted

## 2020-03-19 NOTE — Telephone Encounter (Signed)
  Follow up Call-  Call back number 03/17/2020  Post procedure Call Back phone  # [phone]  Permission to leave phone message Yes  Some recent data might be hidden     Patient questions:  Message left to call us if necessary.

## 2020-03-19 NOTE — Telephone Encounter (Signed)
  Follow up Call-  Call back number 03/17/2020  Post procedure Call Back phone  # [phone]  Permission to leave phone message Yes  Some recent data might be hidden     Patient questions:  Message left to call us if necessary.  Second call.

## 2020-03-20 ENCOUNTER — Encounter: Payer: Self-pay | Admitting: Gastroenterology

## 2020-03-29 ENCOUNTER — Encounter (HOSPITAL_COMMUNITY): Payer: Self-pay

## 2020-03-29 ENCOUNTER — Emergency Department (HOSPITAL_COMMUNITY): Payer: Medicare Other

## 2020-03-29 ENCOUNTER — Other Ambulatory Visit: Payer: Self-pay

## 2020-03-29 ENCOUNTER — Emergency Department (HOSPITAL_COMMUNITY)
Admission: EM | Admit: 2020-03-29 | Discharge: 2020-03-30 | Disposition: A | Discharge status: Home / Self Care | Payer: Medicare Other | Attending: Emergency Medicine | Admitting: Emergency Medicine

## 2020-03-29 DIAGNOSIS — Y92099 Unspecified place in other non-institutional residence as the place of occurrence of the external cause: Secondary | ICD-10-CM | POA: Diagnosis not present

## 2020-03-29 DIAGNOSIS — M21372 Foot drop, left foot: Secondary | ICD-10-CM | POA: Insufficient documentation

## 2020-03-29 DIAGNOSIS — W1839XA Other fall on same level, initial encounter: Secondary | ICD-10-CM | POA: Diagnosis not present

## 2020-03-29 DIAGNOSIS — Y999 Unspecified external cause status: Secondary | ICD-10-CM | POA: Diagnosis not present

## 2020-03-29 DIAGNOSIS — Y9389 Activity, other specified: Secondary | ICD-10-CM | POA: Insufficient documentation

## 2020-03-29 DIAGNOSIS — M545 Low back pain: Secondary | ICD-10-CM | POA: Diagnosis not present

## 2020-03-29 DIAGNOSIS — W19XXXA Unspecified fall, initial encounter: Secondary | ICD-10-CM

## 2020-03-29 DIAGNOSIS — S299XXA Unspecified injury of thorax, initial encounter: Secondary | ICD-10-CM | POA: Diagnosis present

## 2020-03-29 DIAGNOSIS — S2241XA Multiple fractures of ribs, right side, initial encounter for closed fracture: Secondary | ICD-10-CM | POA: Diagnosis not present

## 2020-03-29 NOTE — ED Triage Notes (Signed)
Pt states he has hx of foot drop. He was walking when his foot got caught resulting in a fall this morning. Now complaining of lower back pain . Denies hitting head

## 2020-03-30 ENCOUNTER — Emergency Department (HOSPITAL_COMMUNITY): Payer: Medicare Other

## 2020-03-30 DIAGNOSIS — S2241XA Multiple fractures of ribs, right side, initial encounter for closed fracture: Secondary | ICD-10-CM | POA: Diagnosis not present

## 2020-03-30 MED ORDER — NAPROXEN 250 MG PO TABS: ORAL_TABLET | ORAL | 0 refills | Status: DC

## 2020-03-30 MED ORDER — PERCOCET 5-325 MG PO TABS: 1.0000 | ORAL_TABLET | Freq: Four times a day (QID) | ORAL | 0 refills | Status: DC | PRN

## 2020-03-30 MED ORDER — METHOCARBAMOL 500 MG PO TABS: ORAL_TABLET | ORAL | 0 refills | Status: DC

## 2020-03-30 MED ORDER — KETOROLAC TROMETHAMINE 30 MG/ML IJ SOLN
30.0000 mg | Freq: Once | INTRAMUSCULAR | Status: AC
  Administered 2020-03-30: 30 mg via INTRAMUSCULAR
  Filled 2020-03-30: qty 1

## 2020-03-30 NOTE — Discharge Instructions (Signed)
Use ice packs for comfort.  Take the medication as prescribed.  You will need to contact your primary care doctor if you need more medication.  The fractures of the ribs will take at least 6 weeks to heal however the worst pain should improve over the next 10 to 14 days.  Recheck if you get fever, cough, or get short of breath.

## 2020-03-30 NOTE — ED Provider Notes (Signed)
[facility] EMERGENCY DEPARTMENT Provider Note   CSN: [id number] Arrival date & time: 03/29/20  2216   Time seen 11:09 PM  History Chief Complaint  Patient presents with  . Back Pain    [name] is a 69 y.o. male.  HPI   Patient states about 6 AM this morning, May 30 he was walking in the house and he has a left foot drop which made him trip and fall forward.  He states he landed with his right arm underneath his front.  He did not hit his head or have loss of consciousness.  He states he landed on a hardwood floor on concrete slab.  He states he immediately had low back pain however what he points to is his right rib cage.  He states it starts in his right posterior back and it radiates around to his lateral rib cage area.  He states it hurts when he breathes deeply but he does not have shortness of breath or coughing.  He has had back surgery x7 before.  PCP Kathyrn Drown, MD   Past Medical History:  Diagnosis Date  . Allergy   . Carpal tunnel syndrome   . Chronic back pain   . Chronic combined systolic and diastolic congestive heart failure ([place])   . Chronic ITP (idiopathic thrombocytopenia) (H[facility]) 05/25/2015  . Coronary artery disease   . Coronary artery disease involving native coronary artery of native heart with angina pectoris ([place])   . Degenerative disc disease   . GERD (gastroesophageal reflux disease)   . History of thrombocytopenia   . Hypercholesteremia   . Hypertension   . Hypothyroid   . Lumbar pain   . Myocardial infarction ([place]) 2009  . Obesity   . S/P CABG x 3 04/27/2018   LIMA to LAD SVG to OM1 SVG to OM2  . Shortness of breath dyspnea     Patient Active Problem List   Diagnosis Date Noted  . S/P CABG x 3 04/27/2018  . Angina pectoris ([place]) 04/24/2018  . Obesity   . Chronic combined systolic and diastolic congestive heart failure ([place])   . Peripheral edema 06/30/2017  . Pedal edema 11/28/2016  . Osteopenia 08/13/2015  . Thrombocytopenia  ([place]) 05/25/2015  . Chronic ITP (idiopathic thrombocytopenia) (HCC) 05/25/2015  . Hypothyroidism 08/29/2014  . Diverticulitis of colon (without mention of hemorrhage)(562.11) 02/04/2014  . Dysphagia, unspecified(787.20) 02/04/2014  . Hyperlipidemia 11/18/2013  . Dyspnea 01/02/2012  . Palpitations 01/02/2012  . Mixed hyperlipidemia 07/06/2011  . Coronary artery disease involving native coronary artery of native heart with angina pectoris ([place])   . TOTAL KNEE FOLLOW-UP 08/14/2008  . Chronic back pain 07/23/2008  . KNEE, ARTHRITIS, DEGEN./OSTEO 05/29/2008  . JOINT EFFUSION, KNEE 04/30/2008  . Pain in joint, lower leg 10/10/2007  . Essential hypertension 10/09/2007    Past Surgical History:  Procedure Laterality Date  . BACK SURGERY     5 lumbas disc with cervical and lumbar fusions  . BIOPSY N/A 03/06/2014   Procedure: ESOPHAGEAL BIOPSIES;  Surgeon: [name], MD;  Location: [facility];  Service: Endoscopy;  Laterality: N/A;  . COLONOSCOPY    . COLONOSCOPY WITH PROPOFOL N/A 03/06/2014   Procedure: COLONOSCOPY WITH PROPOFOL;  Surgeon: [name], MD;  Location: [facility];  Service: Endoscopy;  Laterality: N/A;  in cecum at 0807; total withdrawal time 9 minutes  . CORONARY ANGIOPLASTY WITH STENT PLACEMENT     2000, and 2004 has 3 stents  . CORONARY  ARTERY BYPASS GRAFT N/A 04/27/2018   Procedure: CORONARY ARTERY BYPASS GRAFTING (CABG) x Three , using left internal mammary artery and right leg greater saphenous vein;  Surgeon: [name], MD;  Location: [facility];  Service: Open Heart Surgery;  Laterality: N/A;  . ESOPHAGOGASTRODUODENOSCOPY (EGD) WITH PROPOFOL N/A 03/06/2014   Procedure: ESOPHAGOGASTRODUODENOSCOPY (EGD) WITH PROPOFOL;  Surgeon: [name], MD;  Location: [facility];  Service: Endoscopy;  Laterality: N/A;  . LEFT HEART CATH AND CORONARY ANGIOGRAPHY N/A 04/24/2018   Procedure: LEFT HEART CATH AND CORONARY ANGIOGRAPHY;  Surgeon: [name], MD;  Location: [facility];  Service: Cardiovascular;  Laterality: N/A;  . LUMBAR FUSION  2009  . MALONEY DILATION N/A 03/06/2014   Procedure: MALONEY DILATION 54 french;  Surgeon: [name], MD;  Location: [facility];  Service: Endoscopy;  Laterality: N/A;  . NECK SURGERY    . TEE WITHOUT CARDIOVERSION N/A 04/27/2018   Procedure: TRANSESOPHAGEAL ECHOCARDIOGRAM (TEE);  Surgeon: [name], MD;  Location: [facility];  Service: Open Heart Surgery;  Laterality: N/A;       Family History  Problem Relation Age of Onset  . Heart attack Father   . Heart disease Father   . Colon cancer Maternal Grandmother   . Esophageal cancer Neg Hx   . Rectal cancer Neg Hx   . Stomach cancer Neg Hx     Social History   Tobacco Use  . Smoking status: Former Smoker    Packs/day: 2.00    Years: 20.00    Pack years: 40.00    Types: Cigarettes    Quit date: 07/06/1991    Years since quitting: 28.7  . Smokeless tobacco: Never Used  Substance Use Topics  . Alcohol use: Yes    Comment: occassional  . Drug use: No    Home Medications Prior to Admission medications   Medication Sig Start Date End Date Taking? Authorizing Provider  albuterol (PROVENTIL HFA;VENTOLIN HFA) 108 (90 Base) MCG/ACT inhaler Inhale 2 puffs into the lungs every 6 (six) hours as needed for wheezing or shortness of breath. 10/29/18   Cheyenne Adas, NP  aspirin EC 81 MG EC tablet Take 1 tablet (81 mg total) by mouth daily. 05/03/18   Gold, Wilder Glade, PA-C  citalopram (CELEXA) 20 MG tablet Take 1 tablet (20 mg total) by mouth every morning. 07/23/19   Kathyrn Drown, MD  levothyroxine (SYNTHROID) 150 MCG tablet TAKE 1 TABLET BY MOUTH DAILY BEFORE BREAKFAST. 07/23/19   Kathyrn Drown, MD  lisinopril (ZESTRIL) 10 MG tablet TAKE 1 TABLET BY MOUTH DAILY. 03/11/20   Kathyrn Drown, MD  methocarbamol (ROBAXIN) 500 MG tablet Take 1 or 2 po Q 6hrs for muscle pain 03/30/20   Rolland Porter, MD  metoprolol tartrate (LOPRESSOR) 50 MG tablet TAKE 1 TABLET BY MOUTH TWICE  DAILY. 01/31/20   Kathyrn Drown, MD  Multiple Vitamin (MULTIVITAMIN) capsule Take 1 capsule by mouth daily.      [provider]  naproxen (NAPROSYN) 250 MG tablet Take 1 po BID with food prn pain 03/30/20   Rolland Porter, MD  pantoprazole (PROTONIX) 40 MG tablet Take 1 tablet (40 mg total) by mouth 2 (two) times daily. 03/05/20   Esterwood, Amy S, PA-C  PERCOCET 5-325 MG tablet Take 1 tablet by mouth every 6 (six) hours as needed for severe pain. 03/30/20   Rolland Porter, MD  potassium chloride (KLOR-CON) 10 MEQ tablet TAKE 1 TABLET BY MOUTH DAILY  03/02/20   Kathyrn Drown, MD  rosuvastatin (CRESTOR) 40 MG tablet TAKE 1 TABLET BY MOUTH EVERY EVENING 12/16/19   Kathyrn Drown, MD  tamsulosin (FLOMAX) 0.4 MG CAPS capsule TAKE 2 CAPSULE BY MOUTH AT NIGHT 02/12/20   Kathyrn Drown, MD  torsemide (DEMADEX) 20 MG tablet TAKE 2 TABLETS BY MOUTH DAILY. 01/31/20   Kathyrn Drown, MD    Allergies    Penicillins, Sulfa antibiotics, and Zoloft [sertraline hcl]  Review of Systems   Review of Systems  All other systems reviewed and are negative.   Physical Exam Updated Vital Signs BP (!) 166/71   Pulse 60   Temp 97.8 F (36.6 C) (Oral)   Resp 20   Ht 6\' 1"  (1.854 m)   Wt 101.5 kg   SpO2 99%   BMI 29.52 kg/m   Physical Exam Vitals and nursing note reviewed.  Constitutional:      Appearance: Normal appearance. He is obese.  HENT:     Head: Normocephalic and atraumatic.     Right Ear: External ear normal.     Left Ear: External ear normal.  Eyes:     Extraocular Movements: Extraocular movements intact.     Conjunctiva/sclera: Conjunctivae normal.  Cardiovascular:     Rate and Rhythm: Normal rate.  Pulmonary:     Effort: Pulmonary effort is normal. No respiratory distress.     Breath sounds: Normal breath sounds.     Comments: Patient has some tenderness when I palpate his right posterior rib cage area however as they go around towards his axilla he gets more painful and there is a very  tender area in the anterior axillary line on the right. Chest:     Chest wall: Tenderness present.  Abdominal:     General: Abdomen is flat. Bowel sounds are normal.     Palpations: Abdomen is soft.     Tenderness: There is no abdominal tenderness.  Musculoskeletal:        General: Normal range of motion.     Cervical back: Normal range of motion and neck supple.     Comments: Patient is nontender when I palpate his thoracic and lumbar spine in the midline.  Neurological:     General: No focal deficit present.     Mental Status: He is alert and oriented to person, place, and time.     Cranial Nerves: No cranial nerve deficit.  Psychiatric:        Mood and Affect: Mood normal.        Behavior: Behavior normal.        Thought Content: Thought content normal.     ED Results / Procedures / Treatments   Labs (all labs ordered are listed, but only abnormal results are displayed) Labs Reviewed - No data to display  EKG None  Radiology DG Ribs Unilateral W/Chest Right  Result Date: 03/30/2020 CLINICAL DATA:  Fall rib pain EXAM: RIGHT RIBS AND CHEST - 3+ VIEW COMPARISON:  CT January 16, 2020 FINDINGS: There is an acute anterior lateral rib fractures of the eighth through tenth ribs. Of the there are healed anterior rib fractures of the fifth through ninth ribs. There is no evidence of pneumothorax or pleural effusion. Both lungs are clear. Heart size and mediastinal contours are within normal limits. IMPRESSION: Acute anterolateral eighth through tenth rib fractures. Electronically Signed   By: Prudencio Pair M.D.   On: 03/30/2020 00:49   DG Lumbar Spine Complete  Result Date:  03/29/2020 CLINICAL DATA:  69 year old male with fall and back pain. EXAM: LUMBAR SPINE - COMPLETE 4+ VIEW COMPARISON:  Lumbar spine radiograph dated 02/17/2011 and CT abdomen pelvis dated 01/16/2020. FINDINGS: There is no acute fracture or subluxation of the lumbar spine. L4-S1 disc spacer and posterior fusion. The  hardware appears intact. There is grade 1 L3-L4 retrolisthesis. The bones are osteopenic. A linear lucency through the posterior bridging osteophyte and L3 was seen on the prior CT and likely chronic changes. Advanced atherosclerotic calcification of the abdominal aorta. The soft tissues are unremarkable. IMPRESSION: 1. No acute fracture or subluxation of the lumbar spine. 2. L4-S1 posterior fusion. Electronically Signed   By: Anner Crete M.D.   On: 03/29/2020 23:39    Procedures Procedures (including critical care time)  Medications Ordered in ED Medications  ketorolac (TORADOL) 30 MG/ML injection 30 mg (30 mg Intramuscular Given 03/30/20 0032)    ED Course  I have reviewed the triage vital signs and the nursing notes.  Pertinent labs & imaging results that were available during my care of the patient were reviewed by me and considered in my medical decision making (see chart for details).    MDM Rules/Calculators/A&P                       View of his prior labs show he had blood work on January 13, 2020, his BUN was 12 and creatinine was 1.18.  Patient was given Toradol IM and he had been sent to radiology from triage for lumbar spine films however patient was resent to radiology to get right rib films.   Review of the [place] shows patient has a overdose risk score of 380.  He has had 10 narcotic prescriptions in the past 2 years including oxycodone 5 mg, fentanyl 25 mcg/h patches and hydrocodone 7.5/325.  The last prescription filled was September 30 for the hydrocodone, he had fentanyl in October 2019 and the rest of the prescriptions were in 2019.  They were all in doses of 10-30 tabs   Final Clinical Impression(s) / ED Diagnoses Final diagnoses:  Fall in home, initial encounter  Closed fracture of multiple ribs of right side, initial encounter    Rx / DC Orders ED Discharge Orders         Ordered    PERCOCET 5-325 MG tablet  Every 6 hours PRN     03/30/20  0102    methocarbamol (ROBAXIN) 500 MG tablet     03/30/20 0102    naproxen (NAPROSYN) 250 MG tablet     03/30/20 0102         Plan discharge  Rolland Porter, MD, Barbette Or, MD 03/30/20 [phone]

## 2020-04-02 ENCOUNTER — Other Ambulatory Visit: Payer: Self-pay

## 2020-04-02 ENCOUNTER — Encounter: Payer: Self-pay | Admitting: Family Medicine

## 2020-04-02 ENCOUNTER — Ambulatory Visit (INDEPENDENT_AMBULATORY_CARE_PROVIDER_SITE_OTHER): Payer: Medicare Other | Admitting: Family Medicine

## 2020-04-02 VITALS — BP 128/74 | Temp 97.8°F | Ht 73.0 in | Wt 233.0 lb

## 2020-04-02 DIAGNOSIS — R0781 Pleurodynia: Secondary | ICD-10-CM | POA: Diagnosis not present

## 2020-04-02 DIAGNOSIS — S2241XD Multiple fractures of ribs, right side, subsequent encounter for fracture with routine healing: Secondary | ICD-10-CM

## 2020-04-02 DIAGNOSIS — W01198D Fall on same level from slipping, tripping and stumbling with subsequent striking against other object, subsequent encounter: Secondary | ICD-10-CM

## 2020-04-02 DIAGNOSIS — E038 Other specified hypothyroidism: Secondary | ICD-10-CM

## 2020-04-02 DIAGNOSIS — E7849 Other hyperlipidemia: Secondary | ICD-10-CM | POA: Diagnosis not present

## 2020-04-02 DIAGNOSIS — Z23 Encounter for immunization: Secondary | ICD-10-CM

## 2020-04-02 DIAGNOSIS — Z125 Encounter for screening for malignant neoplasm of prostate: Secondary | ICD-10-CM

## 2020-04-02 MED ORDER — PERCOCET 5-325 MG PO TABS: 1.0000 | ORAL_TABLET | ORAL | 0 refills | Status: DC | PRN

## 2020-04-02 NOTE — Progress Notes (Signed)
   Subjective:    Patient ID: [name], male    DOB: [birth year], 69 y.o.   MRN: [id number]  HPIFollow up ED visit for a fall. Pt states only trouble he is having today is soreness on right ribs.  This patient comes in today because he fell broke a bunch of ribs and having a lot of pain discomfort he is able to breathe okay he relates the pain medicine does help does not want to be on pain medicine long-term understands the risk Would like to get some more oxycodone.   Has a consult with Dr. Johney Maine at [facility] (t-o have gallbladder ) removed Patient also has consult coming up with general surgery for the possibility of having his gallbladder removed   Review of Systems  Constitutional: Negative for activity change, fatigue and fever.  HENT: Negative for congestion and rhinorrhea.   Respiratory: Negative for cough and shortness of breath.   Cardiovascular: Negative for chest pain and leg swelling.  Gastrointestinal: Negative for abdominal pain, diarrhea and nausea.  Genitourinary: Negative for dysuria and hematuria.  Neurological: Negative for weakness and headaches.  Psychiatric/Behavioral: Negative for agitation and behavioral problems.       Objective:   Physical Exam Vitals reviewed.  Cardiovascular:     Rate and Rhythm: Normal rate and regular rhythm.     Heart sounds: Normal heart sounds. No murmur.  Pulmonary:     Effort: Pulmonary effort is normal.     Breath sounds: Normal breath sounds.  Lymphadenopathy:     Cervical: No cervical adenopathy.  Neurological:     Mental Status: He is alert.  Psychiatric:        Behavior: Behavior normal.    Tenderness in the right ribs lungs are clear      Assessment & Plan:  Right rib fracture limited amount oxycodone given caution drowsiness not for long-term use  Fall prevention discussed  See surgeon for evaluation regarding gallbladder recommend that he notify his cardiologist if they do decide to do  surgery  Patient to do lab work in the next 60 days and follow-up for wellness visit this summer

## 2020-04-03 ENCOUNTER — Other Ambulatory Visit: Payer: Self-pay | Admitting: Family Medicine

## 2020-04-03 NOTE — Telephone Encounter (Signed)
Med check up 01/16/20

## 2020-04-14 DIAGNOSIS — Z87898 Personal history of other specified conditions: Secondary | ICD-10-CM | POA: Diagnosis not present

## 2020-04-14 DIAGNOSIS — K8010 Calculus of gallbladder with chronic cholecystitis without obstruction: Secondary | ICD-10-CM | POA: Diagnosis not present

## 2020-04-14 DIAGNOSIS — I2510 Atherosclerotic heart disease of native coronary artery without angina pectoris: Secondary | ICD-10-CM | POA: Diagnosis not present

## 2020-04-14 DIAGNOSIS — Z951 Presence of aortocoronary bypass graft: Secondary | ICD-10-CM | POA: Diagnosis not present

## 2020-04-14 DIAGNOSIS — R140 Abdominal distension (gaseous): Secondary | ICD-10-CM | POA: Diagnosis not present

## 2020-04-15 ENCOUNTER — Telehealth: Payer: Self-pay

## 2020-04-15 ENCOUNTER — Other Ambulatory Visit (HOSPITAL_COMMUNITY): Payer: Self-pay | Admitting: Surgery

## 2020-04-15 ENCOUNTER — Other Ambulatory Visit: Payer: Self-pay | Admitting: Surgery

## 2020-04-15 NOTE — Telephone Encounter (Signed)
   [facility] Pre-operative Risk Assessment    HEARTCARE STAFF: - Please ensure there is not already an duplicate clearance open for this procedure. - Under Visit Info/Reason for Call, type in Other and utilize the format Clearance MM/DD/YY or Clearance TBD. Do not use dashes or single digits. - If request is for dental extraction, please clarify the # of teeth to be extracted.  Request for surgical clearance:  1. What type of surgery is being performed?  Lap Chole    2. When is this surgery scheduled? TBD   3. What type of clearance is required (medical clearance vs. Pharmacy clearance to hold med vs. Both)? Medical   4. Are there any medications that need to be held prior to surgery and how long? None listed    5. Practice name and name of physician performing surgery? [facility]   6. What is the office phone number? [phone]   7.   What is the office fax number? [phone] AttnMicheline Chapman Spillers  8.   Anesthesia type (None, local, MAC, general) ? General    Mendel Ryder 04/15/2020, 3:33 PM  _________________________________________________________________   (provider comments below)

## 2020-04-16 ENCOUNTER — Encounter: Payer: Self-pay | Admitting: Family Medicine

## 2020-04-16 ENCOUNTER — Telehealth: Payer: Self-pay | Admitting: General Practice

## 2020-04-16 NOTE — Telephone Encounter (Signed)
° °  Primary Cardiologist: Jenkins Rouge, MD  Chart reviewed as part of pre-operative protocol coverage. Given past medical history and time since last visit, based on ACC/AHA guidelines, [name] would be at acceptable risk for the planned procedure without further cardiovascular testing.   I will route this recommendation to the requesting party via Epic fax function and remove from pre-op pool.  Please call with questions.  Jossie Ng. Quetzaly Ebner NP-C    04/16/2020, 8:41 AM [address] Office [phone] Fax [phone]

## 2020-04-16 NOTE — Telephone Encounter (Signed)
After reviewing the chart it looks to be the pt is returning a call to Pre OP Provider. I will forward notes to pre op team.

## 2020-04-16 NOTE — Telephone Encounter (Signed)
i have [name] on the line returning your phone call

## 2020-05-04 ENCOUNTER — Other Ambulatory Visit: Payer: Self-pay | Admitting: Family Medicine

## 2020-05-05 ENCOUNTER — Other Ambulatory Visit (HOSPITAL_COMMUNITY): Payer: Self-pay | Admitting: Surgery

## 2020-05-05 ENCOUNTER — Other Ambulatory Visit: Payer: Self-pay | Admitting: Surgery

## 2020-05-05 DIAGNOSIS — R140 Abdominal distension (gaseous): Secondary | ICD-10-CM

## 2020-05-06 ENCOUNTER — Ambulatory Visit (INDEPENDENT_AMBULATORY_CARE_PROVIDER_SITE_OTHER): Payer: Medicare Other | Admitting: Gastroenterology

## 2020-05-13 ENCOUNTER — Ambulatory Visit (HOSPITAL_COMMUNITY)
Admission: RE | Admit: 2020-05-13 | Discharge: 2020-05-13 | Disposition: A | Discharge status: Home / Self Care | Payer: Medicare Other | Source: Ambulatory Visit | Attending: Surgery | Admitting: Surgery

## 2020-05-13 ENCOUNTER — Other Ambulatory Visit: Payer: Self-pay

## 2020-05-13 DIAGNOSIS — R110 Nausea: Secondary | ICD-10-CM | POA: Diagnosis not present

## 2020-05-13 DIAGNOSIS — R140 Abdominal distension (gaseous): Secondary | ICD-10-CM

## 2020-05-13 DIAGNOSIS — R109 Unspecified abdominal pain: Secondary | ICD-10-CM | POA: Diagnosis not present

## 2020-05-13 MED ORDER — TECHNETIUM TC 99M SULFUR COLLOID
2.0600 | Freq: Once | INTRAVENOUS | Status: AC
  Administered 2020-05-13: 2.06 via ORAL

## 2020-05-18 ENCOUNTER — Other Ambulatory Visit: Payer: Self-pay | Admitting: Family Medicine

## 2020-05-18 ENCOUNTER — Other Ambulatory Visit: Payer: Self-pay | Admitting: Cardiovascular Disease

## 2020-05-18 ENCOUNTER — Ambulatory Visit: Payer: Self-pay | Admitting: Surgery

## 2020-05-18 NOTE — Telephone Encounter (Signed)
This is a [name].  °

## 2020-06-10 ENCOUNTER — Other Ambulatory Visit: Payer: Self-pay | Admitting: Family Medicine

## 2020-06-18 ENCOUNTER — Other Ambulatory Visit: Payer: Self-pay | Admitting: Family Medicine

## 2020-06-22 DIAGNOSIS — R21 Rash and other nonspecific skin eruption: Secondary | ICD-10-CM | POA: Diagnosis not present

## 2020-06-25 ENCOUNTER — Encounter: Payer: Self-pay | Admitting: Family Medicine

## 2020-06-26 ENCOUNTER — Other Ambulatory Visit: Payer: Self-pay

## 2020-06-26 ENCOUNTER — Ambulatory Visit (INDEPENDENT_AMBULATORY_CARE_PROVIDER_SITE_OTHER): Payer: Medicare Other | Admitting: Family Medicine

## 2020-06-26 DIAGNOSIS — U071 COVID-19: Secondary | ICD-10-CM | POA: Diagnosis not present

## 2020-06-26 NOTE — Telephone Encounter (Signed)
Nurses This patient is high risk I would like to see him at 4:00 Car visit Last visit of the day thanks

## 2020-06-26 NOTE — Progress Notes (Signed)
   Subjective:    Patient ID: [name], male    DOB: 1951/01/31, 69 y.o.   MRN: [id number]  HPI Pt here due to testing positive for COVID yesterday. Pt began to have fever (highest 102), headache, runny nose, loss of smell, and body aches. Pt has been taking Tylenol and that does help some.  Very nice gentleman with high risk factors including heart disease and age Presents with body aches fever chills loss of sense of smell feeling fatigued tired rundown denies wheezing difficulty breathing PMH benign see above see problem list Review of Systems See above    Objective:   Physical Exam Eardrums normal mucous membranes moist neck no masses lungs respiratory rate normal heart regular O2 saturation 95%       Assessment & Plan:  Positive Covid Viral illness Warning signs discussed in detail Recommend antibody infusion referral was made Tylenol as needed Patient will monitor his O2 sat over the weekend he was given a O2 sat meter on loan

## 2020-06-27 ENCOUNTER — Telehealth (HOSPITAL_COMMUNITY): Payer: Self-pay | Admitting: Physician Assistant

## 2020-06-27 NOTE — Telephone Encounter (Signed)
Called to discuss with patient about Covid symptoms and the use of casirivimab/imdevimab, a monoclonal antibody infusion for those with mild to moderate Covid symptoms and at a high risk of hospitalization.  Pt is qualified for this infusion at the [facility] center due to; Specific high risk criteria : Older age (>/= 69 yo), BMI > 25 and Cardiovascular disease or hypertension   Message left to call back our hotline [phone] and sent mychart message.  Angelena Form PA-C  [facility]

## 2020-06-28 ENCOUNTER — Other Ambulatory Visit: Payer: Self-pay | Admitting: Adult Health

## 2020-06-28 NOTE — Progress Notes (Signed)
I connected by phone with [name] on 06/28/2020 at 2:03 PM to discuss the potential use of a new treatment for mild to moderate COVID-19 viral infection in non-hospitalized patients.  This patient is a 69 y.o. male that meets the FDA criteria for Emergency Use Authorization of COVID monoclonal antibody casirivimab/imdevimab.  Has a (+) direct SARS-CoV-2 viral test result  Has mild or moderate COVID-19   Is NOT hospitalized due to COVID-19  Is within 10 days of symptom onset  Has at least one of the high risk factor(s) for progression to severe COVID-19 and/or hospitalization as defined in EUA.  Specific high risk criteria : Older age (>/= 69 yo)   I have spoken and communicated the following to the patient or parent/caregiver regarding COVID monoclonal antibody treatment:  1. FDA has authorized the emergency use for the treatment of mild to moderate COVID-19 in adults and pediatric patients with positive results of direct SARS-CoV-2 viral testing who are 72 years of age and older weighing at least 40 kg, and who are at high risk for progressing to severe COVID-19 and/or hospitalization.  2. The significant known and potential risks and benefits of COVID monoclonal antibody, and the extent to which such potential risks and benefits are unknown.  3. Information on available alternative treatments and the risks and benefits of those alternatives, including clinical trials.  4. Patients treated with COVID monoclonal antibody should continue to self-isolate and use infection control measures (e.g., wear mask, isolate, social distance, avoid sharing personal items, clean and disinfect "high touch" surfaces, and frequent handwashing) according to CDC guidelines.   5. The patient or parent/caregiver has the option to accept or refuse COVID monoclonal antibody treatment.  After reviewing this information with the patient, The patient agreed to proceed with receiving casirivimab\imdevimab  infusion and will be provided a copy of the Fact sheet prior to receiving the infusion.  Set up for 06/29/20 at 1400.  Will bring photo of rapid home test .   [name] 06/28/2020 2:03 PM

## 2020-06-29 ENCOUNTER — Ambulatory Visit (HOSPITAL_COMMUNITY): Payer: Medicare Other | Attending: Pulmonary Disease

## 2020-06-29 ENCOUNTER — Encounter (HOSPITAL_COMMUNITY): Payer: Self-pay

## 2020-06-29 ENCOUNTER — Other Ambulatory Visit: Payer: Self-pay | Admitting: Family Medicine

## 2020-07-21 ENCOUNTER — Other Ambulatory Visit: Payer: Self-pay | Admitting: Family Medicine

## 2020-08-01 ENCOUNTER — Other Ambulatory Visit: Payer: Self-pay | Admitting: Family Medicine

## 2020-08-07 DIAGNOSIS — H2513 Age-related nuclear cataract, bilateral: Secondary | ICD-10-CM | POA: Diagnosis not present

## 2020-08-14 ENCOUNTER — Other Ambulatory Visit: Payer: Self-pay | Admitting: Family Medicine

## 2020-08-16 NOTE — Telephone Encounter (Signed)
90-day with follow-up office visit in 3 months

## 2020-09-02 ENCOUNTER — Other Ambulatory Visit: Payer: Self-pay | Admitting: Family Medicine

## 2020-09-14 ENCOUNTER — Encounter: Payer: Self-pay | Admitting: Family Medicine

## 2020-09-14 ENCOUNTER — Other Ambulatory Visit: Payer: Self-pay

## 2020-09-14 ENCOUNTER — Ambulatory Visit (INDEPENDENT_AMBULATORY_CARE_PROVIDER_SITE_OTHER): Payer: Medicare Other | Admitting: Family Medicine

## 2020-09-14 VITALS — BP 138/86 | HR 78 | Temp 97.4°F | Ht 73.0 in | Wt 229.0 lb

## 2020-09-14 DIAGNOSIS — E7849 Other hyperlipidemia: Secondary | ICD-10-CM | POA: Diagnosis not present

## 2020-09-14 DIAGNOSIS — E038 Other specified hypothyroidism: Secondary | ICD-10-CM | POA: Diagnosis not present

## 2020-09-14 DIAGNOSIS — I10 Essential (primary) hypertension: Secondary | ICD-10-CM

## 2020-09-14 DIAGNOSIS — D693 Immune thrombocytopenic purpura: Secondary | ICD-10-CM

## 2020-09-14 DIAGNOSIS — I5042 Chronic combined systolic (congestive) and diastolic (congestive) heart failure: Secondary | ICD-10-CM

## 2020-09-14 DIAGNOSIS — Z125 Encounter for screening for malignant neoplasm of prostate: Secondary | ICD-10-CM

## 2020-09-14 DIAGNOSIS — D367 Benign neoplasm of other specified sites: Secondary | ICD-10-CM

## 2020-09-14 DIAGNOSIS — Z79899 Other long term (current) drug therapy: Secondary | ICD-10-CM

## 2020-09-14 DIAGNOSIS — D696 Thrombocytopenia, unspecified: Secondary | ICD-10-CM

## 2020-09-14 MED ORDER — POTASSIUM CHLORIDE ER 10 MEQ PO TBCR
10.0000 meq | EXTENDED_RELEASE_TABLET | Freq: Every day | ORAL | 1 refills | Status: DC
Start: 2020-09-14 — End: 2021-03-16

## 2020-09-14 MED ORDER — ROSUVASTATIN CALCIUM 40 MG PO TABS
40.0000 mg | ORAL_TABLET | Freq: Every evening | ORAL | 1 refills | Status: DC
Start: 2020-09-14 — End: 2021-01-04

## 2020-09-14 MED ORDER — TORSEMIDE 20 MG PO TABS
40.0000 mg | ORAL_TABLET | Freq: Every day | ORAL | 1 refills | Status: DC
Start: 2020-09-14 — End: 2021-03-16

## 2020-09-14 MED ORDER — LEVOTHYROXINE SODIUM 150 MCG PO TABS: ORAL_TABLET | ORAL | 1 refills | Status: DC

## 2020-09-14 MED ORDER — METOPROLOL TARTRATE 50 MG PO TABS: 50.0000 mg | ORAL_TABLET | Freq: Two times a day (BID) | ORAL | 5 refills | Status: DC

## 2020-09-14 MED ORDER — CITALOPRAM HYDROBROMIDE 20 MG PO TABS: 20.0000 mg | ORAL_TABLET | Freq: Every morning | ORAL | 1 refills | Status: DC

## 2020-09-14 MED ORDER — LISINOPRIL 10 MG PO TABS: 10.0000 mg | ORAL_TABLET | Freq: Every day | ORAL | 1 refills | Status: DC

## 2020-09-14 NOTE — Progress Notes (Signed)
Subjective:    Patient ID: [name], male    DOB: Jul 13, 1951, 69 y.o.   MRN: [id number]  HPImed check up.   Pt concerned about knot on left lower leg. Came up about one month ago.  He noticed this about a month ago it soft nontender sometimes itches seems to be getting bigger.  Does not know what it is.  No known injury. Essential hypertension - Plan: Basic metabolic panel  Other specified hypothyroidism  Other hyperlipidemia - Plan: Lipid panel  Thrombocytopenia ([place]) - Plan: CBC with Differential/Platelet  Dermoid cyst of lower extremity, unspecified laterality - Plan: Ambulatory referral to General Surgery  High risk medication use - Plan: Hepatic function panel  Screening PSA (prostate specific antigen) - Plan: PSA History of stress related issues Celexa is doing well currently continue current measures Takes his thyroid medicine on a regular basis.  Denies having issues with that Blood pressure medicine takes on a regular basis watches salt in the diet tries to stay active and has kept his weight down through healthier eating Reflux under good control with Protonix takes that on a regular basis Has history of low platelets but no bleeding issues recently    Review of Systems  Constitutional: Negative for diaphoresis and fatigue.  HENT: Negative for congestion and rhinorrhea.   Respiratory: Negative for cough and shortness of breath.   Cardiovascular: Negative for chest pain and leg swelling.  Gastrointestinal: Negative for abdominal pain and diarrhea.  Skin: Negative for color change and rash.  Neurological: Negative for dizziness and headaches.  Psychiatric/Behavioral: Negative for behavioral problems and confusion.       Objective:   Physical Exam Vitals reviewed.  Constitutional:      General: He is not in acute distress. HENT:     Head: Normocephalic and atraumatic.  Eyes:     General:        Right eye: No discharge.        Left eye: No discharge.    Neck:     Trachea: No tracheal deviation.  Cardiovascular:     Rate and Rhythm: Normal rate and regular rhythm.     Heart sounds: Normal heart sounds. No murmur heard.   Pulmonary:     Effort: Pulmonary effort is normal. No respiratory distress.     Breath sounds: Normal breath sounds.  Lymphadenopathy:     Cervical: No cervical adenopathy.  Skin:    General: Skin is warm and dry.  Neurological:     Mental Status: He is alert.     Coordination: Coordination normal.  Psychiatric:        Behavior: Behavior normal.           Assessment & Plan:  1. Essential hypertension Blood pressure good control on recheck continue current measures watch diet - Basic metabolic panel  2. Other specified hypothyroidism Thyroid good control on previous lab work continue current measures  3. Other hyperlipidemia Watches diet continue medication check labs - Lipid panel  4. Thrombocytopenia (HCC) No bleeding issues check CBC - CBC with Differential/Platelet  5. Dermoid cyst of lower extremity, unspecified laterality Referral to general surgery more than likely benign but because it is getting bigger may need to be removed - Ambulatory referral to General Surgery  6. High risk medication use Liver function today - Hepatic function panel  7. Screening PSA (prostate specific antigen) Screening PSA today - PSA  8. Chronic ITP (idiopathic thrombocytopenia) (HCC) Check CBC to look at platelets  9. Chronic combined systolic and diastolic congestive heart failure (HCC) No sign of overt failure under good control currently Follow-up 4 months

## 2020-09-30 DIAGNOSIS — Z79899 Other long term (current) drug therapy: Secondary | ICD-10-CM | POA: Diagnosis not present

## 2020-09-30 DIAGNOSIS — D696 Thrombocytopenia, unspecified: Secondary | ICD-10-CM | POA: Diagnosis not present

## 2020-09-30 DIAGNOSIS — I10 Essential (primary) hypertension: Secondary | ICD-10-CM | POA: Diagnosis not present

## 2020-09-30 DIAGNOSIS — E7849 Other hyperlipidemia: Secondary | ICD-10-CM | POA: Diagnosis not present

## 2020-10-01 LAB — CBC WITH DIFFERENTIAL/PLATELET
Basophils Absolute: 0 10*3/uL (ref 0.0–0.2)
Basos: 1 %
EOS (ABSOLUTE): 0.2 10*3/uL (ref 0.0–0.4)
Eos: 2 %
Hematocrit: 40.9 % (ref 37.5–51.0)
Hemoglobin: 13.9 g/dL (ref 13.0–17.7)
Immature Grans (Abs): 0 10*3/uL (ref 0.0–0.1)
Immature Granulocytes: 0 %
Lymphocytes Absolute: 1 10*3/uL (ref 0.7–3.1)
Lymphs: 14 %
MCH: 32.1 pg (ref 26.6–33.0)
MCHC: 34 g/dL (ref 31.5–35.7)
MCV: 95 fL (ref 79–97)
Monocytes Absolute: 0.4 10*3/uL (ref 0.1–0.9)
Monocytes: 6 %
Neutrophils Absolute: 5.6 10*3/uL (ref 1.4–7.0)
Neutrophils: 77 %
Platelets: 125 10*3/uL — ABNORMAL LOW (ref 150–450)
RBC: 4.33 x10E6/uL (ref 4.14–5.80)
RDW: 13.1 % (ref 11.6–15.4)
WBC: 7.2 10*3/uL (ref 3.4–10.8)

## 2020-10-01 LAB — PSA: Prostate Specific Ag, Serum: 0.7 ng/mL (ref 0.0–4.0)

## 2020-10-01 LAB — BASIC METABOLIC PANEL
BUN/Creatinine Ratio: 13 (ref 10–24)
BUN: 11 mg/dL (ref 8–27)
CO2: 22 mmol/L (ref 20–29)
Calcium: 9 mg/dL (ref 8.6–10.2)
Chloride: 105 mmol/L (ref 96–106)
Creatinine, Ser: 0.88 mg/dL (ref 0.76–1.27)
GFR calc Af Amer: 101 mL/min/{1.73_m2} (ref >59)
GFR calc non Af Amer: 88 mL/min/{1.73_m2} (ref >59)
Glucose: 90 mg/dL (ref 65–99)
Potassium: 4.4 mmol/L (ref 3.5–5.2)
Sodium: 141 mmol/L (ref 134–144)

## 2020-10-01 LAB — LIPID PANEL
Chol/HDL Ratio: 3.2 ratio (ref 0.0–5.0)
Cholesterol, Total: 187 mg/dL (ref 100–199)
HDL: 59 mg/dL (ref >39)
LDL Chol Calc (NIH): 109 mg/dL — ABNORMAL HIGH (ref 0–99)
Triglycerides: 106 mg/dL (ref 0–149)
VLDL Cholesterol Cal: 19 mg/dL (ref 5–40)

## 2020-10-01 LAB — HEPATIC FUNCTION PANEL
ALT: 9 IU/L (ref 0–44)
AST: 13 IU/L (ref 0–40)
Albumin: 4.6 g/dL (ref 3.8–4.8)
Alkaline Phosphatase: 71 IU/L (ref 44–121)
Bilirubin Total: 0.4 mg/dL (ref 0.0–1.2)
Bilirubin, Direct: <0.1 mg/dL (ref 0.00–0.40)
Total Protein: 6.5 g/dL (ref 6.0–8.5)

## 2020-10-05 ENCOUNTER — Ambulatory Visit (INDEPENDENT_AMBULATORY_CARE_PROVIDER_SITE_OTHER): Payer: Medicare Other | Admitting: Family Medicine

## 2020-10-05 ENCOUNTER — Encounter: Payer: Self-pay | Admitting: Family Medicine

## 2020-10-05 ENCOUNTER — Other Ambulatory Visit: Payer: Self-pay

## 2020-10-05 DIAGNOSIS — R059 Cough, unspecified: Secondary | ICD-10-CM

## 2020-10-05 DIAGNOSIS — J019 Acute sinusitis, unspecified: Secondary | ICD-10-CM | POA: Diagnosis not present

## 2020-10-05 DIAGNOSIS — B9689 Other specified bacterial agents as the cause of diseases classified elsewhere: Secondary | ICD-10-CM | POA: Diagnosis not present

## 2020-10-05 MED ORDER — BENZONATATE 100 MG PO CAPS: 100.0000 mg | ORAL_CAPSULE | Freq: Two times a day (BID) | ORAL | 0 refills | Status: DC | PRN

## 2020-10-05 MED ORDER — AZITHROMYCIN 250 MG PO TABS: ORAL_TABLET | ORAL | 0 refills | Status: DC

## 2020-10-05 NOTE — Progress Notes (Signed)
Patient ID: [name], male    DOB: January 30, 1951, 69 y.o.   MRN: [id number]   Chief Complaint  Patient presents with  . Cough   Subjective:    This is a new problem.  Connected via telephone with 2 identifiers.  Reports "I have got a cold "associated symptoms include low-grade fever, headache, congestion, cough, occasional left ear pain, and sinus pain and pressure.  Pertinent negatives include no chills, no abdominal pain no nausea vomiting or diarrhea.  Has tried Alka-Seltzer plus, saline nasal spray, and Tylenol.  Is fully vaccinated from Covid 19, his booster dose was 2.5 weeks ago and he has had Covid infection in the past.  Reports this is similar to past sinus infection.  Cough This is a new problem. Episode onset: 4 days ago. The cough is productive of sputum. Associated symptoms include ear pain, a fever, headaches, nasal congestion and postnasal drip. Pertinent negatives include no chest pain, chills, sore throat or shortness of breath. Associated symptoms comments: Sinus pressure . Treatments tried: alka-seltzer, saline nose spray, tylenol.    Virtual Visit via Video Note  I connected with [name] on 10/05/20 at  3:50 PM EST by a telephone enabled telemedicine application and verified that I am speaking with the correct person using two identifiers.  Location: Patient: home Provider: office   I discussed the limitations of evaluation and management by telemedicine and the availability of in person appointments. The patient expressed understanding and agreed to proceed.  History of Present Illness:    Observations/Objective:   Assessment and Plan:   Follow Up Instructions:    I discussed the assessment and treatment plan with the patient. The patient was provided an opportunity to ask questions and all were answered. The patient agreed with the plan and demonstrated an understanding of the instructions.   The patient was advised to call back or seek an  in-person evaluation if the symptoms worsen or if the condition fails to improve as anticipated.  I provided 6 minutes of non-face-to-face time during this encounter.  Medical History [name] has a past medical history of Allergy, Carpal tunnel syndrome, Chronic back pain, Chronic combined systolic and diastolic congestive heart failure ([facility]), Chronic ITP (idiopathic thrombocytopenia) ([place]) (05/25/2015), Coronary artery disease, Coronary artery disease involving native coronary artery of native heart with angina pectoris ([place]), Degenerative disc disease, GERD (gastroesophageal reflux disease), History of thrombocytopenia, Hypercholesteremia, Hypertension, Hypothyroid, Lumbar pain, Myocardial infarction ([place]) (2009), Obesity, S/P CABG x 3 (04/27/2018), and Shortness of breath dyspnea.   Outpatient Encounter Medications as of 10/05/2020  Medication Sig  . albuterol (PROVENTIL HFA;VENTOLIN HFA) 108 (90 Base) MCG/ACT inhaler Inhale 2 puffs into the lungs every 6 (six) hours as needed for wheezing or shortness of breath.  Marland Kitchen aspirin EC 81 MG EC tablet Take 1 tablet (81 mg total) by mouth daily.  Marland Kitchen azithromycin (ZITHROMAX) 250 MG tablet Take 2 tablets by mouth today, then one tablet by mouth for four days  . benzonatate (TESSALON) 100 MG capsule Take 1 capsule (100 mg total) by mouth 2 (two) times daily as needed for cough.  . citalopram (CELEXA) 20 MG tablet Take 1 tablet (20 mg total) by mouth every morning.  Marland Kitchen levothyroxine (SYNTHROID) 150 MCG tablet 1 qd  . lisinopril (ZESTRIL) 10 MG tablet Take 1 tablet (10 mg total) by mouth daily.  . metoprolol tartrate (LOPRESSOR) 50 MG tablet Take 1 tablet (50 mg total) by mouth 2 (two) times daily.  . Multiple  Vitamin (MULTIVITAMIN) capsule Take 1 capsule by mouth daily.    . naproxen (NAPROSYN) 250 MG tablet Take 1 po BID with food prn pain  . nitroGLYCERIN (NITROSTAT) 0.4 MG SL tablet PLACE 1 TABLET UNDER THE TONGUE EVERY 5 MINUTES AS NEEDED FOR CHEST PAINS.  Marland Kitchen  pantoprazole (PROTONIX) 40 MG tablet Take 1 tablet (40 mg total) by mouth 2 (two) times daily.  . potassium chloride (KLOR-CON) 10 MEQ tablet Take 1 tablet (10 mEq total) by mouth daily.  . rosuvastatin (CRESTOR) 40 MG tablet Take 1 tablet (40 mg total) by mouth every evening.  . tamsulosin (FLOMAX) 0.4 MG CAPS capsule TAKE 2 CAPSULE BY MOUTH AT NIGHT  . torsemide (DEMADEX) 20 MG tablet Take 2 tablets (40 mg total) by mouth daily.   No facility-administered encounter medications on file as of 10/05/2020.     Review of Systems  Constitutional: Positive for fever. Negative for chills.  HENT: Positive for congestion, ear pain, postnasal drip, sinus pressure and sinus pain. Negative for sore throat.   Respiratory: Positive for cough. Negative for shortness of breath.   Cardiovascular: Negative for chest pain.  Gastrointestinal: Negative for abdominal pain.  Neurological: Positive for headaches.     Vitals There were no vitals taken for this visit. none Objective:   Physical Exam Unable. Reports pain and pressure in face and that wife reported his facial area looked swollen.   Assessment and Plan   1. Acute bacterial rhinosinusitis - azithromycin (ZITHROMAX) 250 MG tablet; Take 2 tablets by mouth today, then one tablet by mouth for four days  Dispense: 6 each; Refill: 0  2. Cough in adult - benzonatate (TESSALON) 100 MG capsule; Take 1 capsule (100 mg total) by mouth 2 (two) times daily as needed for cough.  Dispense: 20 capsule; Refill: 0   Reports significant maxillary and frontal pain and pressure.  Will treat for acute rhinosinusitis.  Has a history of sinus infections in the past.  Would like something for his cough, difficult getting to sleep at night.  Agrees with plan of care discussed today. Understands warning signs to seek further care: Chest pain, shortness of breath, any significant change in health. Understands to follow-up if symptoms do not improve, or worsen.

## 2020-10-05 NOTE — Telephone Encounter (Signed)
Please call pt and schedule car visit. [name]

## 2020-10-15 ENCOUNTER — Other Ambulatory Visit: Payer: Self-pay | Admitting: *Deleted

## 2020-10-15 ENCOUNTER — Encounter: Payer: Self-pay | Admitting: Family Medicine

## 2020-10-15 MED ORDER — PANTOPRAZOLE SODIUM 40 MG PO TBEC: 40.0000 mg | DELAYED_RELEASE_TABLET | Freq: Two times a day (BID) | ORAL | 0 refills | Status: DC

## 2020-10-29 ENCOUNTER — Other Ambulatory Visit: Payer: Self-pay

## 2020-10-29 ENCOUNTER — Encounter: Payer: Self-pay | Admitting: Family Medicine

## 2020-10-29 MED ORDER — TAMSULOSIN HCL 0.4 MG PO CAPS: ORAL_CAPSULE | ORAL | 0 refills | Status: DC

## 2020-11-25 ENCOUNTER — Other Ambulatory Visit: Payer: Self-pay | Admitting: Family Medicine

## 2021-01-04 ENCOUNTER — Encounter: Payer: Self-pay | Admitting: Family Medicine

## 2021-01-04 ENCOUNTER — Other Ambulatory Visit: Payer: Self-pay | Admitting: *Deleted

## 2021-01-04 MED ORDER — ROSUVASTATIN CALCIUM 40 MG PO TABS: 40.0000 mg | ORAL_TABLET | Freq: Every evening | ORAL | 0 refills | Status: DC

## 2021-01-10 ENCOUNTER — Other Ambulatory Visit: Payer: Self-pay | Admitting: Family Medicine

## 2021-02-21 ENCOUNTER — Other Ambulatory Visit: Payer: Self-pay | Admitting: Family Medicine

## 2021-02-22 NOTE — Telephone Encounter (Signed)
Needs visit in may then route back for refill

## 2021-02-22 NOTE — Telephone Encounter (Signed)
Sent my chart message to schedule appt

## 2021-03-11 NOTE — Progress Notes (Deleted)
Cardiology Office Note    Date:  03/11/2021   ID:  [name], DOB Apr 13, 1951, MRN [id number]  PCP:  Kathyrn Drown, MD  Cardiologist: Jenkins Rouge, MD    No chief complaint on file.   History of Present Illness:    [name] is a 70 y.o. male with past medical history of CAD (s/p prior stenting of LCx and RCA in 2003, stenting of OM in 2012, CABG on 04/27/2018 with LIMA-LAD, SVG-OM1, and SVG-OM2), post-operative atrial fibrillation (occurring at time of CABG), HTN, HLD, Hypothyroidism and ITP   Compliant with meds No angina  He has not had to utilize sublingual nitroglycerin. He denies any specific palpitations, orthopnea, PND, or lower extremity edema.    Seen by PA 07/26/19 and cleared to have dentures and multiple teeth extracted Seen by primary [facility] for abdominal pain and diarhea 01/16/20 CT abdomen negative  C diff  And stool cultures pending WBC normal   Lost 50 lbs and feels great. Off narcotics and back pain better    Has a cabin in [place]    Has had 2 COVID vaccines   ***  Past Medical History:  Diagnosis Date  . Allergy   . Carpal tunnel syndrome   . Chronic back pain   . Chronic combined systolic and diastolic congestive heart failure ([place])   . Chronic ITP (idiopathic thrombocytopenia) (H[facility]) 05/25/2015  . Coronary artery disease   . Coronary artery disease involving native coronary artery of native heart with angina pectoris ([place])   . Degenerative disc disease   . GERD (gastroesophageal reflux disease)   . History of thrombocytopenia   . Hypercholesteremia   . Hypertension   . Hypothyroid   . Lumbar pain   . Myocardial infarction ([facility]) 2009  . Obesity   . S/P CABG x 3 04/27/2018   LIMA to LAD SVG to OM1 SVG to OM2  . Shortness of breath dyspnea     Past Surgical History:  Procedure Laterality Date  . BACK SURGERY     5 lumbas disc with cervical and lumbar fusions  . BIOPSY N/A 03/06/2014   Procedure: ESOPHAGEAL BIOPSIES;  Surgeon: [name], MD;  Location: [facility];  Service: Endoscopy;  Laterality: N/A;  . COLONOSCOPY    . COLONOSCOPY WITH PROPOFOL N/A 03/06/2014   Procedure: COLONOSCOPY WITH PROPOFOL;  Surgeon: [name], MD;  Location: [facility];  Service: Endoscopy;  Laterality: N/A;  in cecum at 0807; total withdrawal time 9 minutes  . CORONARY ANGIOPLASTY WITH STENT PLACEMENT     2000, and 2004 has 3 stents  . CORONARY ARTERY BYPASS GRAFT N/A 04/27/2018   Procedure: CORONARY ARTERY BYPASS GRAFTING (CABG) x Three , using left internal mammary artery and right leg greater saphenous vein;  Surgeon: [name], MD;  Location: [facility];  Service: Open Heart Surgery;  Laterality: N/A;  . ESOPHAGOGASTRODUODENOSCOPY (EGD) WITH PROPOFOL N/A 03/06/2014   Procedure: ESOPHAGOGASTRODUODENOSCOPY (EGD) WITH PROPOFOL;  Surgeon: [name], MD;  Location: [facility];  Service: Endoscopy;  Laterality: N/A;  . LEFT HEART CATH AND CORONARY ANGIOGRAPHY N/A 04/24/2018   Procedure: LEFT HEART CATH AND CORONARY ANGIOGRAPHY;  Surgeon: [name], MD;  Location: [facility];  Service: Cardiovascular;  Laterality: N/A;  . LUMBAR FUSION  2009  . MALONEY DILATION N/A 03/06/2014   Procedure: MALONEY DILATION 54 french;  Surgeon: [name], MD;  Location: [facility];  Service: Endoscopy;  Laterality: N/A;  .  NECK SURGERY    . TEE WITHOUT CARDIOVERSION N/A 04/27/2018   Procedure: TRANSESOPHAGEAL ECHOCARDIOGRAM (TEE);  Surgeon: [name], MD;  Location: [facility];  Service: Open Heart Surgery;  Laterality: N/A;    Current Medications: Outpatient Medications Prior to Visit  Medication Sig Dispense Refill  . albuterol (PROVENTIL HFA;VENTOLIN HFA) 108 (90 Base) MCG/ACT inhaler Inhale 2 puffs into the lungs every 6 (six) hours as needed for wheezing or shortness of breath. 1 Inhaler 0  . aspirin EC 81 MG EC tablet Take 1 tablet (81 mg total) by mouth daily.    Marland Kitchen azithromycin (ZITHROMAX) 250 MG tablet Take 2 tablets by mouth today,  then one tablet by mouth for four days 6 each 0  . benzonatate (TESSALON) 100 MG capsule Take 1 capsule (100 mg total) by mouth 2 (two) times daily as needed for cough. 20 capsule 0  . citalopram (CELEXA) 20 MG tablet Take 1 tablet (20 mg total) by mouth every morning. 90 tablet 1  . levothyroxine (SYNTHROID) 150 MCG tablet 1 qd 90 tablet 1  . lisinopril (ZESTRIL) 10 MG tablet Take 1 tablet (10 mg total) by mouth daily. 90 tablet 1  . metoprolol tartrate (LOPRESSOR) 50 MG tablet Take 1 tablet (50 mg total) by mouth 2 (two) times daily. 60 tablet 5  . Multiple Vitamin (MULTIVITAMIN) capsule Take 1 capsule by mouth daily.      . naproxen (NAPROSYN) 250 MG tablet Take 1 po BID with food prn pain 30 tablet 0  . nitroGLYCERIN (NITROSTAT) 0.4 MG SL tablet PLACE 1 TABLET UNDER THE TONGUE EVERY 5 MINUTES AS NEEDED FOR CHEST PAINS. 25 tablet 3  . pantoprazole (PROTONIX) 40 MG tablet TAKE 1 TABLET(40 MG) BY MOUTH TWICE DAILY 180 tablet 0  . potassium chloride (KLOR-CON) 10 MEQ tablet Take 1 tablet (10 mEq total) by mouth daily. 90 tablet 1  . rosuvastatin (CRESTOR) 40 MG tablet Take 1 tablet (40 mg total) by mouth every evening. 90 tablet 0  . tamsulosin (FLOMAX) 0.4 MG CAPS capsule TAKE 2 CAPSULES BY MOUTH AT NIGHT 180 capsule 0  . torsemide (DEMADEX) 20 MG tablet Take 2 tablets (40 mg total) by mouth daily. 180 tablet 1   No facility-administered medications prior to visit.     Allergies:   Penicillins, Sulfa antibiotics, and Zoloft [sertraline hcl]   Social History   Socioeconomic History  . Marital status: Married    Spouse name: Not on file  . Number of children: Not on file  . Years of education: Not on file  . Highest education level: Not on file  Occupational History  . Occupation: retireed  Tobacco Use  . Smoking status: Former Smoker    Packs/day: 2.00    Years: 20.00    Pack years: 40.00    Types: Cigarettes    Quit date: 07/06/1991    Years since quitting: 29.7  . Smokeless  tobacco: Never Used  Vaping Use  . Vaping Use: Never used  Substance and Sexual Activity  . Alcohol use: Yes    Comment: occassional  . Drug use: No  . Sexual activity: Not Currently    Birth control/protection: None  Other Topics Concern  . Not on file  Social History Narrative   Disabled due to chronic back pain   Social Determinants of Health   Financial Resource Strain: Not on file  Food Insecurity: Not on file  Transportation Needs: Not on file  Physical Activity: Not on file  Stress:  Not on file  Social Connections: Not on file     Family History:  The patient's family history includes Heart attack in his father.   Review of Systems:   Please see the history of present illness.     General:  No chills, fever, night sweats or weight changes. Positive for mouth pain.  Cardiovascular:  No chest pain, dyspnea on exertion, edema, orthopnea, palpitations, paroxysmal nocturnal dyspnea. Dermatological: No rash, lesions/masses Respiratory: No cough, dyspnea Urologic: No hematuria, dysuria Abdominal:   No nausea, vomiting, diarrhea, bright red blood per rectum, melena, or hematemesis Neurologic:  No visual changes, wkns, changes in mental status.   All other systems reviewed and are otherwise negative except as noted above.   Physical Exam:    VS:  There were no vitals taken for this visit.   Affect appropriate Healthy:  appears stated age 73: normal Neck supple with no adenopathy JVP normal no bruits no thyromegaly Lungs clear with no wheezing and good diaphragmatic motion Heart:  S1/S2 no murmur, no rub, gallop or click PMI normal post sternotomy  Abdomen: benighn, BS positve, no tenderness, no AAA no bruit.  No HSM or HJR Distal pulses intact with no bruits No edema Neuro non-focal Skin warm and dry No muscular weakness   Wt Readings from Last 3 Encounters:  09/14/20 103.9 kg  04/02/20 105.7 kg  03/29/20 101.5 kg     Studies/Labs Reviewed:   EKG:   EKG is ordered today.  The ekg ordered today demonstrates NSR, HR 60 with frequent PAC's. TWI along inferior leads.   Recent Labs: 09/30/2020: ALT 9; BUN 11; Creatinine, Ser 0.88; Hemoglobin 13.9; Platelets 125; Potassium 4.4; Sodium 141   Lipid Panel    Component Value Date/Time   CHOL 187 09/30/2020 0947   TRIG 106 09/30/2020 0947   HDL 59 09/30/2020 0947   CHOLHDL 3.2 09/30/2020 0947   CHOLHDL 5.7 04/25/2018 0358   VLDL 25 04/25/2018 0358   LDLCALC 109 (H) 09/30/2020 0947    Additional studies/ records that were reviewed today include:   Cardiac Catheterization: 03/2018  Prox RCA lesion is 95% stenosed.  Dist RCA-2 lesion is 100% stenosed.  Dist RCA-1 lesion is 99% stenosed.  Ost 1st Mrg lesion is 100% stenosed.  Lat 1st Mrg lesion is 100% stenosed.  Ost LAD to Prox LAD lesion is 50% stenosed.  Mid LAD-1 lesion is 75% stenosed. This is seen best in teh LAO caudal view.  Mid LAD-2 lesion is 50% stenosed.  There is mild left ventricular systolic dysfunction.  The left ventricular ejection fraction is 45-50% by visual estimate.  LV end diastolic pressure is normal.  There is no aortic valve stenosis.   Severe three vessel disease including occlusion of both prior stented segments.  De novo LAD lesion, beset seen in the LAD caudal view, and clearly worse from prior cath.  Cardiac surgery consult.    Will admit since he reports pain at rest.  Will hold off on heparin for now given prior platelet issues.  May need to start if pain occurs.   Echocardiogram: 03/2018 Study Conclusions  - Left ventricle: The cavity size was normal. Systolic function was   mildly reduced. The estimated ejection fraction was in the range   of 45% to 50%. Hypokinesis of the inferolateral, anterior and   anteroseptal, and anterolateral myocardium. Doppler parameters   are consistent with abnormal left ventricular relaxation (grade 1   diastolic dysfunction). Doppler parameters are  consistent with  high ventricular filling pressure. - Aortic valve: Transvalvular velocity was within the normal range.   There was no stenosis. There was mild regurgitation directed   towards the mitral anterior leaflet. Regurgitation pressure   half-time: 887 ms. - Aorta: Aortic root dimension: 39 mm (ED). - Ascending aorta: The ascending aorta was mildly dilated. - Mitral valve: Transvalvular velocity was within the normal range.   There was no evidence for stenosis. There was trivial   regurgitation. - Left atrium: The atrium was severely dilated. - Right ventricle: The cavity size was normal. Wall thickness was   normal. Systolic function was normal. - Tricuspid valve: There was trivial regurgitation. - Pulmonary arteries: Systolic pressure was within the normal   range.   Plan:   In order of problems listed above:  1. CAD - s/p prior stenting of LCx and RCA in 2003 with stenting of OM in 2012. Underwent CABG on 04/27/2018 with LIMA-LAD, SVG-OM1, and SVG-OM2.  - continue ASA 81mg  daily, BB, and statin therapy.  2. Post-Operative Atrial Fibrillation - Occurred following CABG but no known recurrence since. Not on anticoagulation continue beta blocker   3. HTN - Continue current medication regimen at this time with Lisinopril 10 mg daily and Lopressor 50 mg twice daily. Stable   4. HLD - LDL 95 on 07/26/19 discussed diet continue high dose crestor   5. Thyroid - continue synthroid TSH normal 07/26/19   6. ITP - last PLT count 125 no bleeding issues f/u hematology    Labwork: None today  Procedures/Testing: None today  Follow-Up: 1 year     Signed, Jenkins Rouge, MD  03/11/2021 6:22 PM    [facility]. [address] [place], [place] [address] Phone: [phone] Fax: [phone]

## 2021-03-13 ENCOUNTER — Other Ambulatory Visit: Payer: Self-pay | Admitting: Family Medicine

## 2021-03-15 NOTE — Telephone Encounter (Signed)
May have 90-day on each 1 of these with a follow-up visit in the summer

## 2021-03-16 ENCOUNTER — Ambulatory Visit: Payer: Medicare Other | Admitting: Cardiovascular Disease

## 2021-04-02 ENCOUNTER — Telehealth: Payer: Self-pay | Admitting: Family Medicine

## 2021-04-02 NOTE — Telephone Encounter (Signed)
Left message for patient to call back and schedule Medicare Annual Wellness Visit (AWV).   Please offer to do virtually or by telephone.   Due for AWVI  Please schedule at anytime with Nurse Health Advisor.   

## 2021-04-04 ENCOUNTER — Other Ambulatory Visit: Payer: Self-pay | Admitting: Family Medicine

## 2021-04-06 ENCOUNTER — Telehealth: Payer: Self-pay

## 2021-04-06 ENCOUNTER — Other Ambulatory Visit: Payer: Self-pay

## 2021-04-06 DIAGNOSIS — E038 Other specified hypothyroidism: Secondary | ICD-10-CM

## 2021-04-06 DIAGNOSIS — I10 Essential (primary) hypertension: Secondary | ICD-10-CM

## 2021-04-06 DIAGNOSIS — E7849 Other hyperlipidemia: Secondary | ICD-10-CM

## 2021-04-06 DIAGNOSIS — D693 Immune thrombocytopenic purpura: Secondary | ICD-10-CM

## 2021-04-06 DIAGNOSIS — Z79899 Other long term (current) drug therapy: Secondary | ICD-10-CM

## 2021-04-06 MED ORDER — TAMSULOSIN HCL 0.4 MG PO CAPS: ORAL_CAPSULE | ORAL | 0 refills | Status: DC

## 2021-04-06 NOTE — Telephone Encounter (Signed)
Patient is scheduled for CPE on 04/30/21 and would like labs done at [facility].  Patient is aware Dr. Nicki Reaper is out this week and it will be next week before it is reviewed.  He also knows to go a week prior to appt for fasting labs at [facility].

## 2021-04-06 NOTE — Telephone Encounter (Signed)
Last labs completed 09/30/20 PSA, Hepatic, Lipid, BMET and CBC. Please advise. Thank you

## 2021-04-06 NOTE — Telephone Encounter (Signed)
Another rx request from April.  I called the patient and he said he only has a few days left of flomax.  He has an appt for a physical on 04-30-21.  Uses [facility] on [address].  (Blood work request on another message)

## 2021-04-09 ENCOUNTER — Other Ambulatory Visit: Payer: Self-pay

## 2021-04-09 ENCOUNTER — Ambulatory Visit
Admission: EM | Admit: 2021-04-09 | Discharge: 2021-04-09 | Disposition: A | Discharge status: Home / Self Care | Payer: Medicare Other | Attending: Emergency Medicine | Admitting: Emergency Medicine

## 2021-04-09 ENCOUNTER — Encounter: Payer: Self-pay | Admitting: Emergency Medicine

## 2021-04-09 DIAGNOSIS — W57XXXA Bitten or stung by nonvenomous insect and other nonvenomous arthropods, initial encounter: Secondary | ICD-10-CM

## 2021-04-09 DIAGNOSIS — S80261A Insect bite (nonvenomous), right knee, initial encounter: Secondary | ICD-10-CM | POA: Diagnosis not present

## 2021-04-09 MED ORDER — DOXYCYCLINE HYCLATE 100 MG PO CAPS: 100.0000 mg | ORAL_CAPSULE | Freq: Two times a day (BID) | ORAL | 0 refills | Status: AC

## 2021-04-09 NOTE — ED Triage Notes (Signed)
Pulled tick off behind RT knee 4-5 days ago area is red and itchy now.

## 2021-04-09 NOTE — Discharge Instructions (Addendum)
Prescribed doxycycline for possible tick borne illness To prevent tick bites, wear long sleeves, long pants, and light colors. Use insect repellent. Follow the instructions on the bottle. If the tick is biting, do not try to remove it with heat, alcohol, petroleum jelly, or fingernail polish. Use tweezers, curved forceps, or a tick-removal tool to grasp the tick. Gently pull up until the tick lets go. Do not twist or jerk the tick. Do not squeeze or crush the tick. Return here or go to ER if you have any new or worsening symptoms (rash, nausea, vomiting, fever, chills, headache, fatigue)

## 2021-04-09 NOTE — ED Provider Notes (Signed)
[facility]   [id number] 04/09/21 Arrival Time: 1  CC: Tick bite  SUBJECTIVE:  [name] is a 70 y.o. male who presents with a tick bite to back of RT knee that occurred 4-5 days ago.  Unsure how long tick was attached.  Not engorged.  Localizes the rash to back of RT knee.  Complains of redness, and itching.  Has tried OTC neosporin without relief.  Symptoms are made worse with scratching  Denies similar symptoms in the past.  Complains of associated headache, and body aches.  Denies fever, chills, nausea, vomiting, dizziness, weakness, or abdominal pain.    ROS: As per HPI.  All other pertinent ROS negative.     Past Medical History:  Diagnosis Date   Allergy    Carpal tunnel syndrome    Chronic back pain    Chronic combined systolic and diastolic congestive heart failure (HCC)    Chronic ITP (idiopathic thrombocytopenia) (HCC) 05/25/2015   Coronary artery disease    Coronary artery disease involving native coronary artery of native heart with angina pectoris (HCC)    Degenerative disc disease    GERD (gastroesophageal reflux disease)    History of thrombocytopenia    Hypercholesteremia    Hypertension    Hypothyroid    Lumbar pain    Myocardial infarction ([place]) 2009   Obesity    S/P CABG x 3 04/27/2018   LIMA to LAD SVG to OM1 SVG to OM2   Shortness of breath dyspnea    Past Surgical History:  Procedure Laterality Date   BACK SURGERY     5 lumbas disc with cervical and lumbar fusions   BIOPSY N/A 03/06/2014   Procedure: ESOPHAGEAL BIOPSIES;  Surgeon: [name], MD;  Location: [facility];  Service: Endoscopy;  Laterality: N/A;   COLONOSCOPY     COLONOSCOPY WITH PROPOFOL N/A 03/06/2014   Procedure: COLONOSCOPY WITH PROPOFOL;  Surgeon: [name], MD;  Location: [facility];  Service: Endoscopy;  Laterality: N/A;  in cecum at 0807; total withdrawal time 9 minutes   CORONARY ANGIOPLASTY WITH STENT PLACEMENT     2000, and 2004 has 3 stents   CORONARY ARTERY  BYPASS GRAFT N/A 04/27/2018   Procedure: CORONARY ARTERY BYPASS GRAFTING (CABG) x Three , using left internal mammary artery and right leg greater saphenous vein;  Surgeon: [name], MD;  Location: [facility];  Service: Open Heart Surgery;  Laterality: N/A;   ESOPHAGOGASTRODUODENOSCOPY (EGD) WITH PROPOFOL N/A 03/06/2014   Procedure: ESOPHAGOGASTRODUODENOSCOPY (EGD) WITH PROPOFOL;  Surgeon: [name], MD;  Location: [facility];  Service: Endoscopy;  Laterality: N/A;   LEFT HEART CATH AND CORONARY ANGIOGRAPHY N/A 04/24/2018   Procedure: LEFT HEART CATH AND CORONARY ANGIOGRAPHY;  Surgeon: [name], MD;  Location: [facility];  Service: Cardiovascular;  Laterality: N/A;   LUMBAR FUSION  2009   MALONEY DILATION N/A 03/06/2014   Procedure: MALONEY DILATION 54 french;  Surgeon: [name], MD;  Location: [facility];  Service: Endoscopy;  Laterality: N/A;   NECK SURGERY     TEE WITHOUT CARDIOVERSION N/A 04/27/2018   Procedure: TRANSESOPHAGEAL ECHOCARDIOGRAM (TEE);  Surgeon: [name], MD;  Location: [facility];  Service: Open Heart Surgery;  Laterality: N/A;   Allergies  Allergen Reactions   Penicillins Other (See Comments)    SYNCOPAL EPISODE  Has patient had a PCN reaction causing immediate rash, facial/tongue/throat swelling, SOB or LIGHTHEADEDNESS with HYPOTENSION [SYNCOPE] #  #  #  YES  #  #  #  Has patient had a PCN reaction causing severe rash involving mucus membranes or skin necrosis:No Has patient had a PCN reaction that required hospitalization:No Has patient had a PCN reaction occurring within the last 10 years:Yes    Sulfa Antibiotics Swelling    SWELLING REACTION UNSPECIFIED > PER PREVIOUS PMH   Zoloft [Sertraline Hcl] Other (See Comments)    Confusion    No current facility-administered medications on file prior to encounter.   Current Outpatient Medications on File Prior to Encounter  Medication Sig Dispense Refill   albuterol (PROVENTIL HFA;VENTOLIN HFA)  108 (90 Base) MCG/ACT inhaler Inhale 2 puffs into the lungs every 6 (six) hours as needed for wheezing or shortness of breath. 1 Inhaler 0   aspirin EC 81 MG EC tablet Take 1 tablet (81 mg total) by mouth daily.     azithromycin (ZITHROMAX) 250 MG tablet Take 2 tablets by mouth today, then one tablet by mouth for four days 6 each 0   benzonatate (TESSALON) 100 MG capsule Take 1 capsule (100 mg total) by mouth 2 (two) times daily as needed for cough. 20 capsule 0   citalopram (CELEXA) 20 MG tablet Take 1 tablet (20 mg total) by mouth every morning. 90 tablet 1   levothyroxine (SYNTHROID) 150 MCG tablet TAKE 1 TABLET BY MOUTH EVERY DAY 90 tablet 0   lisinopril (ZESTRIL) 10 MG tablet Take 1 tablet (10 mg total) by mouth daily. 90 tablet 1   metoprolol tartrate (LOPRESSOR) 50 MG tablet Take 1 tablet (50 mg total) by mouth 2 (two) times daily. 60 tablet 5   Multiple Vitamin (MULTIVITAMIN) capsule Take 1 capsule by mouth daily.       naproxen (NAPROSYN) 250 MG tablet Take 1 po BID with food prn pain 30 tablet 0   nitroGLYCERIN (NITROSTAT) 0.4 MG SL tablet PLACE 1 TABLET UNDER THE TONGUE EVERY 5 MINUTES AS NEEDED FOR CHEST PAINS. 25 tablet 3   pantoprazole (PROTONIX) 40 MG tablet TAKE 1 TABLET(40 MG) BY MOUTH TWICE DAILY 180 tablet 0   potassium chloride (KLOR-CON) 10 MEQ tablet TAKE 1 TABLET(10 MEQ) BY MOUTH DAILY 90 tablet 0   rosuvastatin (CRESTOR) 40 MG tablet TAKE 1 TABLET(40 MG) BY MOUTH EVERY EVENING 90 tablet 0   tamsulosin (FLOMAX) 0.4 MG CAPS capsule TAKE 2 CAPSULES BY MOUTH AT NIGHT 180 capsule 0   torsemide (DEMADEX) 20 MG tablet TAKE 2 TABLETS(40 MG) BY MOUTH DAILY 180 tablet 0   Social History   Socioeconomic History   Marital status: Married    Spouse name: Not on file   Number of children: Not on file   Years of education: Not on file   Highest education level: Not on file  Occupational History   Occupation: retireed  Tobacco Use   Smoking status: Former    Packs/day: 2.00     Years: 20.00    Pack years: 40.00    Types: Cigarettes    Quit date: 07/06/1991    Years since quitting: 29.7   Smokeless tobacco: Never  Vaping Use   Vaping Use: Never used  Substance and Sexual Activity   Alcohol use: Yes    Comment: occassional   Drug use: No   Sexual activity: Not Currently    Birth control/protection: None  Other Topics Concern   Not on file  Social History Narrative   Disabled due to chronic back pain   Social Determinants of Radio broadcast assistant  Strain: Not on file  Food Insecurity: Not on file  Transportation Needs: Not on file  Physical Activity: Not on file  Stress: Not on file  Social Connections: Not on file  Intimate Partner Violence: Not on file   Family History  Problem Relation Age of Onset   Heart attack Father    Heart disease Father    Colon cancer Maternal Grandmother    Esophageal cancer Neg Hx    Rectal cancer Neg Hx    Stomach cancer Neg Hx     OBJECTIVE: Vitals:   04/09/21 1208  BP: (!) 164/74  Pulse: (!) 59  Resp: 19  Temp: 98.3 F (36.8 C)  TempSrc: Oral  SpO2: 95%    General appearance: alert; no distress Head: NCAT Lungs: normal respiratory effort Extremities: no edema Skin: warm and dry; area of erythema to back of RT knee, NTTP, no obvious drainage or bleeding, excoriations present, apx 4 cm in diameter Psychological: alert and cooperative; normal mood and affect  ASSESSMENT & PLAN:  1. Tick bite of right knee, initial encounter     Meds ordered this encounter  Medications   doxycycline (VIBRAMYCIN) 100 MG capsule    Sig: Take 1 capsule (100 mg total) by mouth 2 (two) times daily for 14 days.    Dispense:  28 capsule    Refill:  0    Order Specific Question:   Supervising Provider    Answer:   Raylene Everts [[id number]]   Prescribed doxycycline for possible tick borne illness To prevent tick bites, wear long sleeves, long pants, and light colors. Use insect repellent. Follow the instructions  on the bottle. If the tick is biting, do not try to remove it with heat, alcohol, petroleum jelly, or fingernail polish. Use tweezers, curved forceps, or a tick-removal tool to grasp the tick. Gently pull up until the tick lets go. Do not twist or jerk the tick. Do not squeeze or crush the tick. Return here or go to ER if you have any new or worsening symptoms (rash, nausea, vomiting, fever, chills, headache, fatigue)   Reviewed expectations re: course of current medical issues. Questions answered. Outlined signs and symptoms indicating need for more acute intervention. Patient verbalized understanding. After Visit Summary given.    Lestine Box, PA-C 04/09/21 1217

## 2021-04-10 ENCOUNTER — Other Ambulatory Visit: Payer: Self-pay | Admitting: Family Medicine

## 2021-04-11 NOTE — Telephone Encounter (Signed)
TSH, CMP, CBC, lipid, hyperlipidemia hypertension thrombocytopenia hypothyroidism Please let patient know that he already did a PSA back in December and will not be due for another one till this coming December thank you

## 2021-04-12 NOTE — Telephone Encounter (Signed)
Blood work ordered in Epic. Patient notified. 

## 2021-04-21 NOTE — Progress Notes (Signed)
Cardiology Office Note    Date:  05/04/2021   ID:  [name], DOB [birth year], MRN [id number]  PCP:  Kathyrn Drown, MD  Cardiologist: Jenkins Rouge, MD    No chief complaint on file.   History of Present Illness:    [name] is a 70 y.o. male with past medical history of CAD (s/p prior stenting of LCx and RCA in 2003, stenting of OM in 2012, CABG on 04/27/2018 with LIMA-LAD, SVG-OM1, and SVG-OM2), post-operative atrial fibrillation (occurring at time of CABG), HTN, HLD, Hypothyroidism and ITP   Compliant with meds No angina     Lost 50 lbs and feels great. Off narcotics and back pain better Had dentures fitted and couldn't Eat well for a while. Spends time in [place] where he has [place] in ER 04/09/21 for tick bite behind right knee Rx Vibramycin  My need right foot surgery previous bunionectomy Also having recurrent neck pain. Has had multiple back surgeries with Dr Joya Salm in past  Has a 3 legged Beagle at home that runs in the woods Tick came from dog   Past Medical History:  Diagnosis Date   Allergy    Carpal tunnel syndrome    Chronic back pain    Chronic combined systolic and diastolic congestive heart failure (HCC)    Chronic ITP (idiopathic thrombocytopenia) (HCC) 05/25/2015   Coronary artery disease    Coronary artery disease involving native coronary artery of native heart with angina pectoris (HCC)    Degenerative disc disease    GERD (gastroesophageal reflux disease)    History of thrombocytopenia    Hypercholesteremia    Hypertension    Hypothyroid    Lumbar pain    Myocardial infarction ([place]) 2009   Obesity    S/P CABG x 3 04/27/2018   LIMA to LAD SVG to OM1 SVG to OM2   Shortness of breath dyspnea     Past Surgical History:  Procedure Laterality Date   BACK SURGERY     5 lumbas disc with cervical and lumbar fusions   BIOPSY N/A 03/06/2014   Procedure: ESOPHAGEAL BIOPSIES;  Surgeon: [name], MD;  Location: [facility];  Service:  Endoscopy;  Laterality: N/A;   COLONOSCOPY     COLONOSCOPY WITH PROPOFOL N/A 03/06/2014   Procedure: COLONOSCOPY WITH PROPOFOL;  Surgeon: [name], MD;  [facility];  Service: Endoscopy;  Laterality: N/A;  in cecum at 0807; total withdrawal time 9 minutes   CORONARY ANGIOPLASTY WITH STENT PLACEMENT     2000, and 2004 has 3 stents   CORONARY ARTERY BYPASS GRAFT N/A 04/27/2018   Procedure: CORONARY ARTERY BYPASS GRAFTING (CABG) x Three , using left internal mammary artery and right leg greater saphenous vein;  Surgeon: [name], MD;  Location: [facility];  Service: Open Heart Surgery;  Laterality: N/A;   ESOPHAGOGASTRODUODENOSCOPY (EGD) WITH PROPOFOL N/A 03/06/2014   Procedure: ESOPHAGOGASTRODUODENOSCOPY (EGD) WITH PROPOFOL;  Surgeon: [name], MD;  [facility];  Service: Endoscopy;  Laterality: N/A;   LEFT HEART CATH AND CORONARY ANGIOGRAPHY N/A 04/24/2018   Procedure: LEFT HEART CATH AND CORONARY ANGIOGRAPHY;  Surgeon: [name], MD;  Location: [facility];  Service: Cardiovascular;  Laterality: N/A;   LUMBAR FUSION  2009   MALONEY DILATION N/A 03/06/2014   Procedure: MALONEY DILATION 54 french;  Surgeon: [name], MD;  Location[facility];  Service: Endoscopy;  Laterality: N/A;   NECK SURGERY  TEE WITHOUT CARDIOVERSION N/A 04/27/2018   Procedure: TRANSESOPHAGEAL ECHOCARDIOGRAM (TEE);  Surgeon: [name], MD;  Location: [facility];  Service: Open Heart Surgery;  Laterality: N/A;    Current Medications: Outpatient Medications Prior to Visit  Medication Sig Dispense Refill   albuterol (PROVENTIL HFA;VENTOLIN HFA) 108 (90 Base) MCG/ACT inhaler Inhale 2 puffs into the lungs every 6 (six) hours as needed for wheezing or shortness of breath. 1 Inhaler 0   aspirin EC 81 MG EC tablet Take 1 tablet (81 mg total) by mouth daily.     citalopram (CELEXA) 20 MG tablet Take 1 tablet (20 mg total) by mouth every morning. 90 tablet 1   levothyroxine (SYNTHROID)  150 MCG tablet TAKE 1 TABLET BY MOUTH EVERY DAY 90 tablet 1   lisinopril (ZESTRIL) 10 MG tablet Take 1 tablet (10 mg total) by mouth daily. 90 tablet 1   metoprolol tartrate (LOPRESSOR) 50 MG tablet Take 1 tablet (50 mg total) by mouth 2 (two) times daily. 180 tablet 1   Multiple Vitamin (MULTIVITAMIN) capsule Take 1 capsule by mouth daily.       naproxen (NAPROSYN) 250 MG tablet Take 1 po BID with food prn pain 30 tablet 0   nitroGLYCERIN (NITROSTAT) 0.4 MG SL tablet PLACE 1 TABLET UNDER THE TONGUE EVERY 5 MINUTES AS NEEDED FOR CHEST PAINS. 25 tablet 3   pantoprazole (PROTONIX) 40 MG tablet TAKE 1 TABLET(40 MG) BY MOUTH TWICE DAILY 180 tablet 1   potassium chloride (KLOR-CON) 10 MEQ tablet TAKE 1 TABLET(10 MEQ) BY MOUTH DAILY 90 tablet 0   rosuvastatin (CRESTOR) 40 MG tablet TAKE 1 TABLET(40 MG) BY MOUTH EVERY EVENING 90 tablet 0   tamsulosin (FLOMAX) 0.4 MG CAPS capsule TAKE 2 CAPSULES BY MOUTH AT NIGHT 180 capsule 0   torsemide (DEMADEX) 20 MG tablet TAKE 2 TABLETS(40 MG) BY MOUTH DAILY 180 tablet 0   benzonatate (TESSALON) 100 MG capsule Take 1 capsule (100 mg total) by mouth 2 (two) times daily as needed for cough. 20 capsule 0   No facility-administered medications prior to visit.     Allergies:   Penicillins, Sulfa antibiotics, and Zoloft [sertraline hcl]   Social History   Socioeconomic History   Marital status: Married    Spouse name: Not on file   Number of children: Not on file   Years of education: Not on file   Highest education level: Not on file  Occupational History   Occupation: retireed  Tobacco Use   Smoking status: Former    Packs/day: 2.00    Years: 20.00    Pack years: 40.00    Types: Cigarettes    Quit date: 07/06/1991    Years since quitting: 29.8   Smokeless tobacco: Never  Vaping Use   Vaping Use: Never used  Substance and Sexual Activity   Alcohol use: Yes    Comment: occassional   Drug use: No   Sexual activity: Not Currently    Birth  control/protection: None  Other Topics Concern   Not on file  Social History Narrative   Disabled due to chronic back pain   Social Determinants of Health   Financial Resource Strain: Not on file  Food Insecurity: Not on file  Transportation Needs: Not on file  Physical Activity: Not on file  Stress: Not on file  Social Connections: Not on file     Family History:  The patient's family history includes Heart attack in his father.   Review of Systems:  Please see the history of present illness.     General:  No chills, fever, night sweats or weight changes. Positive for mouth pain.  Cardiovascular:  No chest pain, dyspnea on exertion, edema, orthopnea, palpitations, paroxysmal nocturnal dyspnea. Dermatological: No rash, lesions/masses Respiratory: No cough, dyspnea Urologic: No hematuria, dysuria Abdominal:   No nausea, vomiting, diarrhea, bright red blood per rectum, melena, or hematemesis Neurologic:  No visual changes, wkns, changes in mental status.   All other systems reviewed and are otherwise negative except as noted above.   Physical Exam:    VS:  BP (!) 144/80   Pulse (!) 54   Ht 6\' 1"  (1.854 m)   Wt 109.1 kg   SpO2 98%   BMI 31.74 kg/m    Affect appropriate Healthy:  appears stated age 21: normal Neck supple with no adenopathy JVP normal no bruits no thyromegaly Lungs clear with no wheezing and good diaphragmatic motion Heart:  S1/S2 no murmur, no rub, gallop or click PMI normal post sternotomy  Abdomen: benighn, BS positve, no tenderness, no AAA no bruit.  No HSM or HJR Distal pulses intact with no bruits No edema Neuro non-focal Skin warm and dry No muscular weakness   Wt Readings from Last 3 Encounters:  05/04/21 109.1 kg  04/30/21 108.4 kg  09/14/20 103.9 kg     Studies/Labs Reviewed:   EKG:  EKG is ordered today.  The ekg ordered today demonstrates NSR, HR 60 with frequent PAC's. TWI along inferior leads. 05/04/2021 SR rate 54  isolated PVC IVCD PR 220 msec   Recent Labs: 04/26/2021: ALT 29; BUN 14; Creatinine, Ser 1.17; Hemoglobin 12.7; Platelets 131; Potassium 5.0; Sodium 140; TSH 2.950   Lipid Panel    Component Value Date/Time   CHOL 171 04/26/2021 0814   TRIG 59 04/26/2021 0814   HDL 54 04/26/2021 0814   CHOLHDL 3.2 04/26/2021 0814   CHOLHDL 5.7 04/25/2018 0358   VLDL 25 04/25/2018 0358   LDLCALC 105 (H) 04/26/2021 0814    Additional studies/ records that were reviewed today include:   Cardiac Catheterization: 03/2018 Prox RCA lesion is 95% stenosed. Dist RCA-2 lesion is 100% stenosed. Dist RCA-1 lesion is 99% stenosed. Ost 1st Mrg lesion is 100% stenosed. Lat 1st Mrg lesion is 100% stenosed. Ost LAD to Prox LAD lesion is 50% stenosed. Mid LAD-1 lesion is 75% stenosed. This is seen best in teh LAO caudal view. Mid LAD-2 lesion is 50% stenosed. There is mild left ventricular systolic dysfunction. The left ventricular ejection fraction is 45-50% by visual estimate. LV end diastolic pressure is normal. There is no aortic valve stenosis.   Severe three vessel disease including occlusion of both prior stented segments.  De novo LAD lesion, beset seen in the LAD caudal view, and clearly worse from prior cath.   Cardiac surgery consult.     Will admit since he reports pain at rest.  Will hold off on heparin for now given prior platelet issues.  May need to start if pain occurs.   Echocardiogram: 03/2018 Study Conclusions   - Left ventricle: The cavity size was normal. Systolic function was   mildly reduced. The estimated ejection fraction was in the range   of 45% to 50%. Hypokinesis of the inferolateral, anterior and   anteroseptal, and anterolateral myocardium. Doppler parameters   are consistent with abnormal left ventricular relaxation (grade 1   diastolic dysfunction). Doppler parameters are consistent with   high ventricular filling pressure. - Aortic valve: Transvalvular  velocity was  within the normal range.   There was no stenosis. There was mild regurgitation directed   towards the mitral anterior leaflet. Regurgitation pressure   half-time: 887 ms. - Aorta: Aortic root dimension: 39 mm (ED). - Ascending aorta: The ascending aorta was mildly dilated. - Mitral valve: Transvalvular velocity was within the normal range.   There was no evidence for stenosis. There was trivial   regurgitation. - Left atrium: The atrium was severely dilated. - Right ventricle: The cavity size was normal. Wall thickness was   normal. Systolic function was normal. - Tricuspid valve: There was trivial regurgitation. - Pulmonary arteries: Systolic pressure was within the normal   range.    Plan:   In order of problems listed above:  1. CAD - s/p prior stenting of LCx and RCA in 2003 with stenting of OM in 2012. Underwent CABG on 04/27/2018 with LIMA-LAD, SVG-OM1, and SVG-OM2.  - continue ASA 81mg  daily, BB, and statin therapy.  2. Post-Operative Atrial Fibrillation - Occurred following CABG but no known recurrence since. Not on anticoagulation continue beta blocker   3. HTN - Continue current medication regimen at this time with Lisinopril 10 mg daily and Lopressor 50 mg twice daily. Stable   4. HLD - LDL 95 on 07/26/19 discussed diet continue high dose crestor   5. Thyroid - continue synthroid TSH normal 07/26/19   6. ITP - last PLT count 145 on 07/26/19 no bleeding issues f/u hematology   7. Preoperative;  clear to have any foot/back surgery needed. Since Dr Joya Salm is retired suggested that he have Dr Saintclair Halsted in same practice see him if Dr Wolfgang Phoenix refers him    Labwork: None today  Procedures/Testing: None today  Follow-Up: 1 year     Signed, Jenkins Rouge, MD  05/04/2021 10:02 AM    [facility]. [address] [place], [place] [address] Phone: [phone] Fax: [phone]

## 2021-04-26 DIAGNOSIS — D693 Immune thrombocytopenic purpura: Secondary | ICD-10-CM | POA: Diagnosis not present

## 2021-04-26 DIAGNOSIS — E7849 Other hyperlipidemia: Secondary | ICD-10-CM | POA: Diagnosis not present

## 2021-04-26 DIAGNOSIS — I10 Essential (primary) hypertension: Secondary | ICD-10-CM | POA: Diagnosis not present

## 2021-04-26 DIAGNOSIS — Z79899 Other long term (current) drug therapy: Secondary | ICD-10-CM | POA: Diagnosis not present

## 2021-04-26 DIAGNOSIS — E038 Other specified hypothyroidism: Secondary | ICD-10-CM | POA: Diagnosis not present

## 2021-04-27 ENCOUNTER — Ambulatory Visit: Payer: Medicare Other

## 2021-04-27 LAB — CBC WITH DIFFERENTIAL/PLATELET
Basophils Absolute: 0 10*3/uL (ref 0.0–0.2)
Basos: 1 %
EOS (ABSOLUTE): 0.2 10*3/uL (ref 0.0–0.4)
Eos: 5 %
Hematocrit: 38.6 % (ref 37.5–51.0)
Hemoglobin: 12.7 g/dL — ABNORMAL LOW (ref 13.0–17.7)
Immature Grans (Abs): 0 10*3/uL (ref 0.0–0.1)
Immature Granulocytes: 1 %
Lymphocytes Absolute: 1.1 10*3/uL (ref 0.7–3.1)
Lymphs: 24 %
MCH: 30.5 pg (ref 26.6–33.0)
MCHC: 32.9 g/dL (ref 31.5–35.7)
MCV: 93 fL (ref 79–97)
Monocytes Absolute: 0.4 10*3/uL (ref 0.1–0.9)
Monocytes: 8 %
Neutrophils Absolute: 2.7 10*3/uL (ref 1.4–7.0)
Neutrophils: 61 %
Platelets: 131 10*3/uL — ABNORMAL LOW (ref 150–450)
RBC: 4.16 x10E6/uL (ref 4.14–5.80)
RDW: 12.8 % (ref 11.6–15.4)
WBC: 4.3 10*3/uL (ref 3.4–10.8)

## 2021-04-27 LAB — COMPREHENSIVE METABOLIC PANEL
ALT: 29 IU/L (ref 0–44)
AST: 18 IU/L (ref 0–40)
Albumin/Globulin Ratio: 2.3 — ABNORMAL HIGH (ref 1.2–2.2)
Albumin: 4.3 g/dL (ref 3.8–4.8)
Alkaline Phosphatase: 70 IU/L (ref 44–121)
BUN/Creatinine Ratio: 12 (ref 10–24)
BUN: 14 mg/dL (ref 8–27)
Bilirubin Total: 0.3 mg/dL (ref 0.0–1.2)
CO2: 22 mmol/L (ref 20–29)
Calcium: 9 mg/dL (ref 8.6–10.2)
Chloride: 105 mmol/L (ref 96–106)
Creatinine, Ser: 1.17 mg/dL (ref 0.76–1.27)
Globulin, Total: 1.9 g/dL (ref 1.5–4.5)
Glucose: 93 mg/dL (ref 65–99)
Potassium: 5 mmol/L (ref 3.5–5.2)
Sodium: 140 mmol/L (ref 134–144)
Total Protein: 6.2 g/dL (ref 6.0–8.5)
eGFR: 67 mL/min/{1.73_m2} (ref >59)

## 2021-04-27 LAB — LIPID PANEL
Chol/HDL Ratio: 3.2 ratio (ref 0.0–5.0)
Cholesterol, Total: 171 mg/dL (ref 100–199)
HDL: 54 mg/dL (ref >39)
LDL Chol Calc (NIH): 105 mg/dL — ABNORMAL HIGH (ref 0–99)
Triglycerides: 59 mg/dL (ref 0–149)
VLDL Cholesterol Cal: 12 mg/dL (ref 5–40)

## 2021-04-27 LAB — TSH: TSH: 2.95 u[IU]/mL (ref 0.450–4.500)

## 2021-04-30 ENCOUNTER — Other Ambulatory Visit: Payer: Self-pay

## 2021-04-30 ENCOUNTER — Ambulatory Visit (INDEPENDENT_AMBULATORY_CARE_PROVIDER_SITE_OTHER): Payer: Medicare Other | Admitting: Family Medicine

## 2021-04-30 ENCOUNTER — Encounter: Payer: Self-pay | Admitting: Family Medicine

## 2021-04-30 VITALS — BP 143/78 | HR 56 | Temp 97.7°F | Ht 73.0 in | Wt 239.0 lb

## 2021-04-30 DIAGNOSIS — R200 Anesthesia of skin: Secondary | ICD-10-CM | POA: Diagnosis not present

## 2021-04-30 DIAGNOSIS — D696 Thrombocytopenia, unspecified: Secondary | ICD-10-CM | POA: Diagnosis not present

## 2021-04-30 DIAGNOSIS — I10 Essential (primary) hypertension: Secondary | ICD-10-CM

## 2021-04-30 DIAGNOSIS — I25118 Atherosclerotic heart disease of native coronary artery with other forms of angina pectoris: Secondary | ICD-10-CM | POA: Diagnosis not present

## 2021-04-30 DIAGNOSIS — E7849 Other hyperlipidemia: Secondary | ICD-10-CM | POA: Diagnosis not present

## 2021-04-30 DIAGNOSIS — Z0000 Encounter for general adult medical examination without abnormal findings: Secondary | ICD-10-CM

## 2021-04-30 DIAGNOSIS — Z0001 Encounter for general adult medical examination with abnormal findings: Secondary | ICD-10-CM | POA: Diagnosis not present

## 2021-04-30 DIAGNOSIS — E038 Other specified hypothyroidism: Secondary | ICD-10-CM | POA: Diagnosis not present

## 2021-04-30 DIAGNOSIS — I5042 Chronic combined systolic (congestive) and diastolic (congestive) heart failure: Secondary | ICD-10-CM | POA: Diagnosis not present

## 2021-04-30 DIAGNOSIS — Z125 Encounter for screening for malignant neoplasm of prostate: Secondary | ICD-10-CM

## 2021-04-30 MED ORDER — LISINOPRIL 10 MG PO TABS: 10.0000 mg | ORAL_TABLET | Freq: Every day | ORAL | 1 refills | Status: DC

## 2021-04-30 MED ORDER — CITALOPRAM HYDROBROMIDE 20 MG PO TABS: 20.0000 mg | ORAL_TABLET | Freq: Every morning | ORAL | 1 refills | Status: DC

## 2021-04-30 MED ORDER — METOPROLOL TARTRATE 50 MG PO TABS: 50.0000 mg | ORAL_TABLET | Freq: Two times a day (BID) | ORAL | 1 refills | Status: DC

## 2021-04-30 MED ORDER — PANTOPRAZOLE SODIUM 40 MG PO TBEC: DELAYED_RELEASE_TABLET | ORAL | 1 refills | Status: DC

## 2021-04-30 MED ORDER — LEVOTHYROXINE SODIUM 150 MCG PO TABS: ORAL_TABLET | ORAL | 1 refills | Status: DC

## 2021-04-30 NOTE — Progress Notes (Signed)
Subjective:    Patient ID: [name], male    DOB: 06/02/1951, 70 y.o.   MRN: [id number]  HPI  AWV- Annual Wellness Visit  The patient was seen for their annual wellness visit. The patient's past medical history, surgical history, and family history were reviewed. Pertinent vaccines were reviewed ( tetanus, pneumonia, shingles, flu) The patient's medication list was reviewed and updated.  The height and weight were entered.  BMI recorded in electronic record elsewhere  Cognitive screening was completed. Outcome of Mini - Cog: 5   Falls /depression screening electronically recorded within record elsewhere  Current tobacco usage:none (All patients who use tobacco were given written and verbal information on quitting)  Recent listing of emergency department/hospitalizations over the past year were reviewed.  current specialist the patient sees on a regular basis: cardiologist   Medicare annual wellness visit patient questionnaire was reviewed.  A written screening schedule for the patient for the next 5-10 years was given. Appropriate discussion of followup regarding next visit was discussed.     Review of Systems     Objective:   Physical Exam General-in no acute distress Eyes-no discharge Lungs-respiratory rate normal, CTA CV-no murmurs,RRR Extremities skin warm dry no edema Neuro grossly normal Behavior normal, alert  Patient was observed he was able to walk but he does have some difficulty with his gait because of previous back surgery.  His strength in his hands are relatively good      Assessment & Plan:  1. Encounter for subsequent annual wellness visit (AWV) in Medicare patient Adult wellness-complete.wellness physical was conducted today. Importance of diet and exercise were discussed in detail.  In addition to this a discussion regarding safety was also covered. We also reviewed over immunizations and gave recommendations regarding current  immunization needed for age.  In addition to this additional areas were also touched on including: Preventative health exams needed:  Colonoscopy 2028 Shingrix recommended information given  Patient was advised yearly wellness exam  - citalopram (CELEXA) 20 MG tablet; Take 1 tablet (20 mg total) by mouth every morning.  Dispense: 90 tablet; Refill: 1 - levothyroxine (SYNTHROID) 150 MCG tablet; TAKE 1 TABLET BY MOUTH EVERY DAY  Dispense: 90 tablet; Refill: 1 - lisinopril (ZESTRIL) 10 MG tablet; Take 1 tablet (10 mg total) by mouth daily.  Dispense: 90 tablet; Refill: 1 - metoprolol tartrate (LOPRESSOR) 50 MG tablet; Take 1 tablet (50 mg total) by mouth 2 (two) times daily.  Dispense: 180 tablet; Refill: 1 - pantoprazole (PROTONIX) 40 MG tablet; TAKE 1 TABLET(40 MG) BY MOUTH TWICE DAILY  Dispense: 180 tablet; Refill: 1  2. Essential hypertension Blood pressure decent control checks it at home typically gets 134/70 continue medication watch diet - citalopram (CELEXA) 20 MG tablet; Take 1 tablet (20 mg total) by mouth every morning.  Dispense: 90 tablet; Refill: 1 - levothyroxine (SYNTHROID) 150 MCG tablet; TAKE 1 TABLET BY MOUTH EVERY DAY  Dispense: 90 tablet; Refill: 1 - lisinopril (ZESTRIL) 10 MG tablet; Take 1 tablet (10 mg total) by mouth daily.  Dispense: 90 tablet; Refill: 1 - metoprolol tartrate (LOPRESSOR) 50 MG tablet; Take 1 tablet (50 mg total) by mouth 2 (two) times daily.  Dispense: 180 tablet; Refill: 1 - pantoprazole (PROTONIX) 40 MG tablet; TAKE 1 TABLET(40 MG) BY MOUTH TWICE DAILY  Dispense: 180 tablet; Refill: 1  3. Other specified hypothyroidism Thyroid test shows good control continue current medicine - citalopram (CELEXA) 20 MG tablet; Take 1 tablet (20 mg total)  by mouth every morning.  Dispense: 90 tablet; Refill: 1 - levothyroxine (SYNTHROID) 150 MCG tablet; TAKE 1 TABLET BY MOUTH EVERY DAY  Dispense: 90 tablet; Refill: 1 - lisinopril (ZESTRIL) 10 MG tablet; Take 1  tablet (10 mg total) by mouth daily.  Dispense: 90 tablet; Refill: 1 - metoprolol tartrate (LOPRESSOR) 50 MG tablet; Take 1 tablet (50 mg total) by mouth 2 (two) times daily.  Dispense: 180 tablet; Refill: 1 - pantoprazole (PROTONIX) 40 MG tablet; TAKE 1 TABLET(40 MG) BY MOUTH TWICE DAILY  Dispense: 180 tablet; Refill: 1  4. Other hyperlipidemia Cholesterol slightly above on maximum dose Crestor eat healthy stay active - citalopram (CELEXA) 20 MG tablet; Take 1 tablet (20 mg total) by mouth every morning.  Dispense: 90 tablet; Refill: 1 - levothyroxine (SYNTHROID) 150 MCG tablet; TAKE 1 TABLET BY MOUTH EVERY DAY  Dispense: 90 tablet; Refill: 1 - lisinopril (ZESTRIL) 10 MG tablet; Take 1 tablet (10 mg total) by mouth daily.  Dispense: 90 tablet; Refill: 1 - metoprolol tartrate (LOPRESSOR) 50 MG tablet; Take 1 tablet (50 mg total) by mouth 2 (two) times daily.  Dispense: 180 tablet; Refill: 1 - pantoprazole (PROTONIX) 40 MG tablet; TAKE 1 TABLET(40 MG) BY MOUTH TWICE DAILY  Dispense: 180 tablet; Refill: 1  5. Hand numbness Some hand numbness I believe this is probably coming from cervical stenosis issues he has had 1 fall he does have left leg weakness from previous back trouble but I am concerned that he may be developing cervical myelopathy I want I told him to watch closely arm strength and leg strength if it is getting worse notify us otherwise follow-up in 3 months at that point time may have to do MRI of the neck his strength in his arms were good as well as hands currently - citalopram (CELEXA) 20 MG tablet; Take 1 tablet (20 mg total) by mouth every morning.  Dispense: 90 tablet; Refill: 1 - levothyroxine (SYNTHROID) 150 MCG tablet; TAKE 1 TABLET BY MOUTH EVERY DAY  Dispense: 90 tablet; Refill: 1 - lisinopril (ZESTRIL) 10 MG tablet; Take 1 tablet (10 mg total) by mouth daily.  Dispense: 90 tablet; Refill: 1 - metoprolol tartrate (LOPRESSOR) 50 MG tablet; Take 1 tablet (50 mg total) by mouth 2  (two) times daily.  Dispense: 180 tablet; Refill: 1 - pantoprazole (PROTONIX) 40 MG tablet; TAKE 1 TABLET(40 MG) BY MOUTH TWICE DAILY  Dispense: 180 tablet; Refill: 1  6. Thrombocytopenia (HCC) Chronic low thrombocytopenia but in a safe range currently continue current measures - citalopram (CELEXA) 20 MG tablet; Take 1 tablet (20 mg total) by mouth every morning.  Dispense: 90 tablet; Refill: 1 - levothyroxine (SYNTHROID) 150 MCG tablet; TAKE 1 TABLET BY MOUTH EVERY DAY  Dispense: 90 tablet; Refill: 1 - lisinopril (ZESTRIL) 10 MG tablet; Take 1 tablet (10 mg total) by mouth daily.  Dispense: 90 tablet; Refill: 1 - metoprolol tartrate (LOPRESSOR) 50 MG tablet; Take 1 tablet (50 mg total) by mouth 2 (two) times daily.  Dispense: 180 tablet; Refill: 1 - pantoprazole (PROTONIX) 40 MG tablet; TAKE 1 TABLET(40 MG) BY MOUTH TWICE DAILY  Dispense: 180 tablet; Refill: 1  7. Chronic combined systolic and diastolic congestive heart failure (HCC) Heart disease stable currently no sign of overt failure may utilize 1 diuretic each morning rather than 2 per patient request if increased swelling or troubles with breathing notify us right away - citalopram (CELEXA) 20 MG tablet; Take 1 tablet (20 mg total) by mouth every morning.  Dispense: 90 tablet; Refill: 1 - levothyroxine (SYNTHROID) 150 MCG tablet; TAKE 1 TABLET BY MOUTH EVERY DAY  Dispense: 90 tablet; Refill: 1 - lisinopril (ZESTRIL) 10 MG tablet; Take 1 tablet (10 mg total) by mouth daily.  Dispense: 90 tablet; Refill: 1 - metoprolol tartrate (LOPRESSOR) 50 MG tablet; Take 1 tablet (50 mg total) by mouth 2 (two) times daily.  Dispense: 180 tablet; Refill: 1 - pantoprazole (PROTONIX) 40 MG tablet; TAKE 1 TABLET(40 MG) BY MOUTH TWICE DAILY  Dispense: 180 tablet; Refill: 1  8. Coronary artery disease of native artery of native heart with stable angina pectoris ([place]) Denies any angina issues currently - citalopram (CELEXA) 20 MG tablet; Take 1 tablet (20 mg  total) by mouth every morning.  Dispense: 90 tablet; Refill: 1 - levothyroxine (SYNTHROID) 150 MCG tablet; TAKE 1 TABLET BY MOUTH EVERY DAY  Dispense: 90 tablet; Refill: 1 - lisinopril (ZESTRIL) 10 MG tablet; Take 1 tablet (10 mg total) by mouth daily.  Dispense: 90 tablet; Refill: 1 - metoprolol tartrate (LOPRESSOR) 50 MG tablet; Take 1 tablet (50 mg total) by mouth 2 (two) times daily.  Dispense: 180 tablet; Refill: 1 - pantoprazole (PROTONIX) 40 MG tablet; TAKE 1 TABLET(40 MG) BY MOUTH TWICE DAILY  Dispense: 180 tablet; Refill: 1  Follow-up 3 months see above

## 2021-04-30 NOTE — Patient Instructions (Addendum)
You can also get the Tdap vaccine at the pharmacy without a prescription.    Shingrix and shingles prevention: know the facts!   Shingrix is a very effective vaccine to prevent shingles.   Shingles is a reactivation of chickenpox -more than 99% of Americans born before 1980 have had chickenpox even if they do not remember it. One in every 10 people who get shingles have severe long-lasting nerve pain as a result.   33 out of a 100 older adults will get shingles if they are unvaccinated.     This vaccine is very important for your health This vaccine is indicated for anyone 50 years or older. You can get this vaccine even if you have already had shingles because you can get the disease more than once in a lifetime.  Your risk for shingles and its complications increases with age.  This vaccine has 2 doses.  The second dose would be 2 to 6 months after the first dose.  If you had Zostavax vaccine in the past you should still get Shingrix. ( Zostavax is only 70% effective and it loses significant strength over a few years .)  This vaccine is given through the pharmacy.  The cost of the vaccine is through your insurance. The pharmacy can inform you of the total costs.  Common side effects including soreness in the arm, some redness and swelling, also some feel fatigue muscle soreness headache low-grade fever.  Side effects typically go away within 2 to 3 days. Remember-the pain from shingles can last a lifetime but these side effects of the vaccine will only last a few days at most. It is very important to get both doses in order to protect yourself fully.   Please get this vaccine at your earliest convenience at your trusted pharmacy.

## 2021-05-04 ENCOUNTER — Ambulatory Visit: Payer: Medicare Other | Admitting: Cardiovascular Disease

## 2021-05-04 ENCOUNTER — Other Ambulatory Visit: Payer: Self-pay

## 2021-05-04 ENCOUNTER — Other Ambulatory Visit: Payer: Self-pay | Admitting: *Deleted

## 2021-05-04 ENCOUNTER — Encounter: Payer: Self-pay | Admitting: Cardiovascular Disease

## 2021-05-04 VITALS — BP 144/80 | HR 54 | Ht 73.0 in | Wt 240.6 lb

## 2021-05-04 DIAGNOSIS — E782 Mixed hyperlipidemia: Secondary | ICD-10-CM | POA: Diagnosis not present

## 2021-05-04 DIAGNOSIS — Z01810 Encounter for preprocedural cardiovascular examination: Secondary | ICD-10-CM | POA: Diagnosis not present

## 2021-05-04 DIAGNOSIS — I2510 Atherosclerotic heart disease of native coronary artery without angina pectoris: Secondary | ICD-10-CM

## 2021-05-04 DIAGNOSIS — I480 Paroxysmal atrial fibrillation: Secondary | ICD-10-CM | POA: Diagnosis not present

## 2021-05-04 DIAGNOSIS — G8929 Other chronic pain: Secondary | ICD-10-CM

## 2021-05-04 NOTE — Progress Notes (Signed)
Nurses-according to Dr.Nissans note the patient and his opinion should see [name] neurosurgery this would be fine-please touch base with patient if he agrees that he would like this referral go ahead regarding his back pain

## 2021-05-04 NOTE — Addendum Note (Signed)
Addended by: Levonne Hubert on: 05/04/2021 02:21 PM   Modules accepted: Orders

## 2021-05-04 NOTE — Patient Instructions (Signed)

## 2021-05-04 NOTE — Progress Notes (Signed)
Patient would like to proceed with referral. Referral ordered in Epic.

## 2021-05-10 ENCOUNTER — Emergency Department (HOSPITAL_COMMUNITY): Payer: Medicare Other

## 2021-05-10 ENCOUNTER — Emergency Department (HOSPITAL_COMMUNITY)
Admission: EM | Admit: 2021-05-10 | Discharge: 2021-05-10 | Disposition: A | Discharge status: Home / Self Care | Payer: Medicare Other | Attending: Emergency Medicine | Admitting: Emergency Medicine

## 2021-05-10 ENCOUNTER — Other Ambulatory Visit: Payer: Self-pay

## 2021-05-10 ENCOUNTER — Encounter (HOSPITAL_COMMUNITY): Payer: Self-pay | Admitting: *Deleted

## 2021-05-10 DIAGNOSIS — I5042 Chronic combined systolic (congestive) and diastolic (congestive) heart failure: Secondary | ICD-10-CM | POA: Diagnosis not present

## 2021-05-10 DIAGNOSIS — Z7982 Long term (current) use of aspirin: Secondary | ICD-10-CM | POA: Insufficient documentation

## 2021-05-10 DIAGNOSIS — Z951 Presence of aortocoronary bypass graft: Secondary | ICD-10-CM | POA: Insufficient documentation

## 2021-05-10 DIAGNOSIS — Z79899 Other long term (current) drug therapy: Secondary | ICD-10-CM | POA: Diagnosis not present

## 2021-05-10 DIAGNOSIS — W1839XA Other fall on same level, initial encounter: Secondary | ICD-10-CM | POA: Diagnosis not present

## 2021-05-10 DIAGNOSIS — I110 Hypertensive heart disease with heart failure: Secondary | ICD-10-CM | POA: Diagnosis not present

## 2021-05-10 DIAGNOSIS — I2510 Atherosclerotic heart disease of native coronary artery without angina pectoris: Secondary | ICD-10-CM | POA: Insufficient documentation

## 2021-05-10 DIAGNOSIS — S2241XA Multiple fractures of ribs, right side, initial encounter for closed fracture: Secondary | ICD-10-CM | POA: Insufficient documentation

## 2021-05-10 DIAGNOSIS — J90 Pleural effusion, not elsewhere classified: Secondary | ICD-10-CM | POA: Diagnosis not present

## 2021-05-10 DIAGNOSIS — E039 Hypothyroidism, unspecified: Secondary | ICD-10-CM | POA: Diagnosis not present

## 2021-05-10 DIAGNOSIS — Y92009 Unspecified place in unspecified non-institutional (private) residence as the place of occurrence of the external cause: Secondary | ICD-10-CM | POA: Diagnosis not present

## 2021-05-10 DIAGNOSIS — Z87891 Personal history of nicotine dependence: Secondary | ICD-10-CM | POA: Diagnosis not present

## 2021-05-10 DIAGNOSIS — S299XXA Unspecified injury of thorax, initial encounter: Secondary | ICD-10-CM | POA: Diagnosis present

## 2021-05-10 DIAGNOSIS — Z981 Arthrodesis status: Secondary | ICD-10-CM | POA: Diagnosis not present

## 2021-05-10 MED ORDER — OXYCODONE-ACETAMINOPHEN 5-325 MG PO TABS: 1.0000 | ORAL_TABLET | ORAL | 0 refills | Status: DC | PRN

## 2021-05-10 NOTE — ED Triage Notes (Signed)
Fell at home 3 days ago. Pain in right rib cage

## 2021-05-10 NOTE — ED Provider Notes (Signed)
[facility] EMERGENCY DEPARTMENT Provider Note   CSN: [id number] Arrival date & time: 05/10/21  1319     History Chief Complaint  Patient presents with   Rib Injury    [name] is a 70 y.o. male.  HPI      [name] is a 70 y.o. male with past history of CAD with CABG, HTN, CHF and chronic ITP, who presents to the Emergency Department complaining of lateral right chest wall pain.  Symptoms began 3 days ago after a fall on his porch in which he landed on a step.  Since the fall, he has been experiencing pain to his right rib area that worsens with certain movements, cough, sneezing or laughing.  Pain improves at rest.  He is having difficulty getting comfortable when lying flat.  He denies neck or back pain, head injury, LOC, dizziness, abdominal pain, shortness of breath, and hemoptysis.  Hx of prior right sided rib fractures.    Past Medical History:  Diagnosis Date   Allergy    Carpal tunnel syndrome    Chronic back pain    Chronic combined systolic and diastolic congestive heart failure (HCC)    Chronic ITP (idiopathic thrombocytopenia) (HCC) 05/25/2015   Coronary artery disease    Coronary artery disease involving native coronary artery of native heart with angina pectoris (HCC)    Degenerative disc disease    GERD (gastroesophageal reflux disease)    History of thrombocytopenia    Hypercholesteremia    Hypertension    Hypothyroid    Lumbar pain    Myocardial infarction ([place]) 2009   Obesity    S/P CABG x 3 04/27/2018   LIMA to LAD SVG to OM1 SVG to OM2   Shortness of breath dyspnea     Patient Active Problem List   Diagnosis Date Noted   Acute bacterial rhinosinusitis 10/05/2020   Cough in adult 10/05/2020   S/P CABG x 3 04/27/2018   Angina pectoris ([place]) 04/24/2018   Obesity    Chronic combined systolic and diastolic congestive heart failure ([place])    Peripheral edema 06/30/2017   Pedal edema 11/28/2016   Osteopenia 08/13/2015   Thrombocytopenia  ([place]) 05/25/2015   Chronic ITP (idiopathic thrombocytopenia) ([place]) 05/25/2015   Hypothyroidism 08/29/2014   Diverticulitis of colon (without mention of hemorrhage)(562.11) 02/04/2014   Dysphagia, unspecified(787.20) 02/04/2014   Hyperlipidemia 11/18/2013   Dyspnea 01/02/2012   Palpitations 01/02/2012   Mixed hyperlipidemia 07/06/2011   Coronary artery disease involving native coronary artery of native heart with angina pectoris ([place])    TOTAL KNEE FOLLOW-UP 08/14/2008   Chronic back pain 07/23/2008   KNEE, ARTHRITIS, DEGEN./OSTEO 05/29/2008   JOINT EFFUSION, KNEE 04/30/2008   Pain in joint, lower leg 10/10/2007   Essential hypertension 10/09/2007    Past Surgical History:  Procedure Laterality Date   BACK SURGERY     5 lumbas disc with cervical and lumbar fusions   BIOPSY N/A 03/06/2014   Procedure: ESOPHAGEAL BIOPSIES;  Surgeon: [name], MD;  Location: [facility];  Service: Endoscopy;  Laterality: N/A;   COLONOSCOPY     COLONOSCOPY WITH PROPOFOL N/A 03/06/2014   Procedure: COLONOSCOPY WITH PROPOFOL;  Surgeon: [name], MD;  Location: [facility];  Service: Endoscopy;  Laterality: N/A;  in cecum at 0807; total withdrawal time 9 minutes   CORONARY ANGIOPLASTY WITH STENT PLACEMENT     2000, and 2004 has 3 stents   CORONARY ARTERY BYPASS GRAFT N/A 04/27/2018   Procedure:  CORONARY ARTERY BYPASS GRAFTING (CABG) x Three , using left internal mammary artery and right leg greater saphenous vein;  Surgeon: [name], MD;  Location: [place];  Service: Open Heart Surgery;  Laterality: N/A;   ESOPHAGOGASTRODUODENOSCOPY (EGD) WITH PROPOFOL N/A 03/06/2014   Procedure: ESOPHAGOGASTRODUODENOSCOPY (EGD) WITH PROPOFOL;  Surgeon: [name], MD;  Location: [facility];  Service: Endoscopy;  Laterality: N/A;   LEFT HEART CATH AND CORONARY ANGIOGRAPHY N/A 04/24/2018   Procedure: LEFT HEART CATH AND CORONARY ANGIOGRAPHY;  Surgeon: [name], MD;  Location: [place] [facility];  Service:  Cardiovascular;  Laterality: N/A;   LUMBAR FUSION  2009   MALONEY DILATION N/A 03/06/2014   Procedure: MALONEY DILATION 54 french;  Surgeon: [name], MD;  Location: [facility];  Service: Endoscopy;  Laterality: N/A;   NECK SURGERY     TEE WITHOUT CARDIOVERSION N/A 04/27/2018   Procedure: TRANSESOPHAGEAL ECHOCARDIOGRAM (TEE);  Surgeon: [name], MD;  Location: [place];  Service: Open Heart Surgery;  Laterality: N/A;       Family History  Problem Relation Age of Onset   Heart attack Father    Heart disease Father    Colon cancer Maternal Grandmother    Esophageal cancer Neg Hx    Rectal cancer Neg Hx    Stomach cancer Neg Hx     Social History   Tobacco Use   Smoking status: Former    Packs/day: 2.00    Years: 20.00    Pack years: 40.00    Types: Cigarettes    Quit date: 07/06/1991    Years since quitting: 29.8   Smokeless tobacco: Never  Vaping Use   Vaping Use: Never used  Substance Use Topics   Alcohol use: Yes    Comment: occassional   Drug use: No    Home Medications Prior to Admission medications   Medication Sig Start Date End Date Taking? Authorizing Provider  albuterol (PROVENTIL HFA;VENTOLIN HFA) 108 (90 Base) MCG/ACT inhaler Inhale 2 puffs into the lungs every 6 (six) hours as needed for wheezing or shortness of breath. 10/29/18   Cheyenne Adas, NP  aspirin EC 81 MG EC tablet Take 1 tablet (81 mg total) by mouth daily. 05/03/18   Gold, Wayne E, PA-C  citalopram (CELEXA) 20 MG tablet Take 1 tablet (20 mg total) by mouth every morning. 04/30/21   Kathyrn Drown, MD  levothyroxine (SYNTHROID) 150 MCG tablet TAKE 1 TABLET BY MOUTH EVERY DAY 04/30/21   Kathyrn Drown, MD  lisinopril (ZESTRIL) 10 MG tablet Take 1 tablet (10 mg total) by mouth daily. 04/30/21   Kathyrn Drown, MD  metoprolol tartrate (LOPRESSOR) 50 MG tablet Take 1 tablet (50 mg total) by mouth 2 (two) times daily. 04/30/21   Kathyrn Drown, MD  Multiple Vitamin (MULTIVITAMIN) capsule Take 1  capsule by mouth daily.      [provider]  naproxen (NAPROSYN) 250 MG tablet Take 1 po BID with food prn pain 03/30/20   Rolland Porter, MD  nitroGLYCERIN (NITROSTAT) 0.4 MG SL tablet PLACE 1 TABLET UNDER THE TONGUE EVERY 5 MINUTES AS NEEDED FOR CHEST PAINS. 05/18/20   Josue Hector, MD  pantoprazole (PROTONIX) 40 MG tablet TAKE 1 TABLET(40 MG) BY MOUTH TWICE DAILY 04/30/21   Kathyrn Drown, MD  potassium chloride (KLOR-CON) 10 MEQ tablet TAKE 1 TABLET(10 MEQ) BY MOUTH DAILY 03/16/21   Kathyrn Drown, MD  rosuvastatin (CRESTOR) 40 MG tablet TAKE 1 TABLET(40  MG) BY MOUTH EVERY EVENING 04/05/21   Kathyrn Drown, MD  tamsulosin (FLOMAX) 0.4 MG CAPS capsule TAKE 2 CAPSULES BY MOUTH AT NIGHT 04/06/21   Kathyrn Drown, MD  torsemide (DEMADEX) 20 MG tablet TAKE 2 TABLETS(40 MG) BY MOUTH DAILY 03/16/21   Kathyrn Drown, MD    Allergies    Penicillins, Sulfa antibiotics, and Zoloft [sertraline hcl]  Review of Systems   Review of Systems  Constitutional:  Negative for chills, fatigue and fever.  Respiratory:  Negative for cough, chest tightness, shortness of breath and wheezing.   Cardiovascular:  Positive for chest pain (right chest wall pain). Negative for palpitations.  Gastrointestinal:  Negative for abdominal pain, blood in stool, nausea and vomiting.  Genitourinary:  Negative for dysuria, flank pain and hematuria.  Musculoskeletal:  Negative for arthralgias, back pain, neck pain and neck stiffness.  Skin:  Negative for rash.  Neurological:  Negative for dizziness, syncope, weakness and numbness.  Hematological:  Does not bruise/bleed easily.   Physical Exam Updated Vital Signs BP (!) 148/82 (BP Location: Right Arm)   Pulse 61   Temp 98.5 F (36.9 C) (Oral)   Resp 18   SpO2 94%   Physical Exam Constitutional:      General: He is not in acute distress.    Appearance: Normal appearance. He is not ill-appearing or diaphoretic.  HENT:     Head: Atraumatic.  Cardiovascular:      Rate and Rhythm: Normal rate and regular rhythm.     Pulses: Normal pulses.  Pulmonary:     Effort: Pulmonary effort is normal. No respiratory distress.     Breath sounds: Normal breath sounds. No wheezing.     Comments: Localized ttp of the anterolateral right ribs.  No ecchymosis, crepitus. Pt is not splinting Chest:     Chest wall: Tenderness present.  Abdominal:     Palpations: Abdomen is soft.     Tenderness: There is no abdominal tenderness. There is no guarding or rebound.  Musculoskeletal:        General: Normal range of motion.     Cervical back: Normal range of motion. No tenderness.  Skin:    General: Skin is warm and dry.     Findings: No rash.  Neurological:     Mental Status: He is alert and oriented to person, place, and time.    ED Results / Procedures / Treatments   Labs (all labs ordered are listed, but only abnormal results are displayed) Labs Reviewed - No data to display  EKG None  Radiology DG Ribs Unilateral W/Chest Right  Result Date: 05/10/2021 CLINICAL DATA:  Right chest pain since fall 3 days ago EXAM: RIGHT RIBS AND CHEST - 3+ VIEW COMPARISON:  Mar 30, 2020 FINDINGS: Acute fracture of the lateral seventh right rib. Bony callus formation and sclerosis about the remote right anterolateral 7th through 10th rib fractures now with separation of the 9th and 10th rib fractures. Healed anterior fifth through ninth rib fractures. Right-sided pleural thickening versus loculated effusion peripherally towards the lung base. No visible pneumothorax. Prior median sternotomy and CABG. Cervical and lumbar fusion hardware. IMPRESSION: 1. Acute fracture of the lateral right seventh rib with adjacent pleural thickening/loculated effusion. No visible pneumothorax. 2. Bony callus formation and sclerosis about the remote right anterolateral 8th through 10th rib fractures now with separation of the 9th and 10th rib fractures. Electronically Signed   By: Dahlia Bailiff MD   On:  05/10/2021 15:54  Procedures Procedures   Medications Ordered in ED Medications - No data to display  ED Course  I have reviewed the triage vital signs and the nursing notes.  Pertinent labs & imaging results that were available during my care of the patient were reviewed by me and considered in my medical decision making (see chart for details).    MDM Rules/Calculators/A&P                          Patient here for evaluation of right rib pain after a fall that occurred 3 days ago.  He has focal tenderness along the anterior lateral right ribs. No head injury, LOC or neck pain.  Takes ASA, but no other blood thinners.  No bruising or crepitus on exam.  He is ambulatory with steady gait.  No reported shortness of breath or increased work of breathing on exam.   Vitals reassuring.  He has history of prior rib fractures.  X-ray today shows acute fracture of the seventh rib and prior callus formation and now separation of the ninth and 10th ribs.    Findings discussed with patient.  Vitals signs are reassuring.  Pt is well appearing and w/o acute distress.  He appears appropriate for discharge home, patient also seen by Dr. Eulis Foster.  Plan will include short course of pain medication and incentive spirometer dispensed for home use. Return precautions discussed. Pt agreeable.      Final Clinical Impression(s) / ED Diagnoses Final diagnoses:  Closed fracture of multiple ribs of right side, initial encounter    Rx / DC Orders ED Discharge Orders     None        Kem Parkinson, PA-C 05/12/21 1533    Daleen Bo, MD 05/15/21 1556

## 2021-05-10 NOTE — Discharge Instructions (Addendum)
Use the spirometer several times a day to take deep breaths.  You may use a folded towel or small pillow held to your right side when coughing or taking deep breaths or sneezing.  You may find that sleeping slightly upright in a recliner for the first few days may be more comfortable.  You will also need to take a stool softener such as Colace while taking the pain medication as it can cause constipation.  Follow-up with your primary care provider for recheck.  Return to the emergency department for any new or worsening symptoms.

## 2021-05-10 NOTE — ED Provider Notes (Signed)
  Face-to-face evaluation   History: He presents for evaluation of right-sided rib pain occurring after fall several days ago.  He states he stood up got a bit dizzy and fell striking his right side on a porch.  He did not lose consciousness.  Since then he has had persistent pain that is worse with deep breathing, and some movements.  He is able ambulate.  He denies focal weakness or paresthesia at this time.  There is been no fever, cough, nausea or vomiting.  Physical exam: Elderly, alert and cooperative.  Heart regular rate and rhythm without murmur.  Lungs good air movement bilaterally without wheezes rales or rhonchi.  Chest moderate anterolateral tenderness, right lower, without crepitation.  Abdomen soft and nontender.  Medical screening examination/treatment/procedure(s) were conducted as a shared visit with non-physician practitioner(s) and myself.  I personally evaluated the patient during the encounter    Daleen Bo, MD 05/15/21 1556

## 2021-05-10 NOTE — ED Notes (Signed)
Patient transported to X-ray 

## 2021-05-12 ENCOUNTER — Other Ambulatory Visit: Payer: Self-pay

## 2021-05-12 ENCOUNTER — Ambulatory Visit (INDEPENDENT_AMBULATORY_CARE_PROVIDER_SITE_OTHER): Payer: Medicare Other | Admitting: Family Medicine

## 2021-05-12 ENCOUNTER — Encounter: Payer: Self-pay | Admitting: Family Medicine

## 2021-05-12 VITALS — BP 123/80 | HR 55 | Temp 97.3°F | Ht 73.0 in | Wt 238.0 lb

## 2021-05-12 DIAGNOSIS — S2241XD Multiple fractures of ribs, right side, subsequent encounter for fracture with routine healing: Secondary | ICD-10-CM | POA: Diagnosis not present

## 2021-05-12 MED ORDER — OXYCODONE-ACETAMINOPHEN 5-325 MG PO TABS: 1.0000 | ORAL_TABLET | ORAL | 0 refills | Status: DC | PRN

## 2021-05-12 NOTE — Progress Notes (Signed)
   Subjective:    Patient ID: [name], male    DOB: 10-29-1951, 70 y.o.   MRN: [id number]  HPI [facility] 5 days ago and went to ED 3 days after fall. Pt states he has 3 rib fractures. Pt states when he stands he feels like bp drops and his legs are weak. Taking oxycodone every 4 hours prn pain.   ER record reviewed x-rays reviewed Review of Systems     Objective:   Physical Exam  General-in no acute distress Eyes-no discharge Lungs-respiratory rate normal, CTA CV-no murmurs,RRR Extremities skin warm dry no edema Neuro grossly normal Behavior normal, alert       Assessment & Plan:   Orthostatic negative Fractured ribs right side with tenderness lungs sound clear oxycodone for short-term use 1 every 4-6 hours as needed for pain caution drowsiness if needing further pain medicine will step it down to hydrocodone.  Patient should gradually improve over the course of the next 2 to 6 weeks sooner if any problems for follow-up

## 2021-05-13 ENCOUNTER — Other Ambulatory Visit (HOSPITAL_COMMUNITY): Payer: Self-pay | Admitting: Neurosurgery

## 2021-05-13 DIAGNOSIS — G959 Disease of spinal cord, unspecified: Secondary | ICD-10-CM

## 2021-05-17 ENCOUNTER — Other Ambulatory Visit: Payer: Self-pay

## 2021-05-17 ENCOUNTER — Ambulatory Visit (HOSPITAL_COMMUNITY)
Admission: RE | Admit: 2021-05-17 | Discharge: 2021-05-17 | Disposition: A | Discharge status: Home / Self Care | Payer: Medicare Other | Source: Ambulatory Visit | Attending: Family Medicine | Admitting: Family Medicine

## 2021-05-17 DIAGNOSIS — S2241XD Multiple fractures of ribs, right side, subsequent encounter for fracture with routine healing: Secondary | ICD-10-CM | POA: Insufficient documentation

## 2021-05-17 DIAGNOSIS — M859 Disorder of bone density and structure, unspecified: Secondary | ICD-10-CM | POA: Diagnosis not present

## 2021-05-17 DIAGNOSIS — M8589 Other specified disorders of bone density and structure, multiple sites: Secondary | ICD-10-CM | POA: Diagnosis not present

## 2021-05-17 DIAGNOSIS — Z79890 Hormone replacement therapy (postmenopausal): Secondary | ICD-10-CM | POA: Insufficient documentation

## 2021-05-17 DIAGNOSIS — Z7952 Long term (current) use of systemic steroids: Secondary | ICD-10-CM | POA: Diagnosis not present

## 2021-05-18 ENCOUNTER — Ambulatory Visit (INDEPENDENT_AMBULATORY_CARE_PROVIDER_SITE_OTHER): Payer: Medicare Other | Admitting: Podiatry

## 2021-05-18 ENCOUNTER — Encounter: Payer: Self-pay | Admitting: Podiatry

## 2021-05-18 DIAGNOSIS — L84 Corns and callosities: Secondary | ICD-10-CM | POA: Diagnosis not present

## 2021-05-18 NOTE — Progress Notes (Signed)
This patient presents to my office for at risk foot care.  This patient requires this care by a professional since this patient will be at risk due to having thrombocytopenia.  The callus under the big toe joint right foot has painful callus.   This patient presents for at risk foot care today.  General Appearance  Alert, conversant and in no acute stress.  Vascular  Dorsalis pedis and posterior tibial  pulses are palpable  bilaterally.  Capillary return is within normal limits  bilaterally. Temperature is within normal limits  bilaterally.  Neurologic  Senn-Weinstein monofilament wire test within normal limits  bilaterally. Muscle power within normal limits bilaterally.  Nails Normal nails  noted  B/L. No evidence of bacterial infection or drainage bilaterally.  Orthopedic  No limitations of motion  feet .  No crepitus or effusions noted.  No bony pathology or digital deformities noted. HAV 1st MPJ right foot.  Skin  normotropic skin with no porokeratosis noted bilaterally.  No signs of infections or ulcers noted.  Callus sub 1st MPJ .  Callus with fissured developing at distal aspect right hellux.   Callus right foot.  Consent was obtained for treatment procedures. IE.  Debridement of callus with # 15 blade.  Discussed condition with patient.  Told him to use chapstick in fissures and pumice stone for callus.   Filed with dremel without incident.    Return office visit    prn                 Told patient to return for periodic foot care and evaluation due to potential at risk complications.   Gardiner Barefoot DPM

## 2021-05-19 ENCOUNTER — Other Ambulatory Visit: Payer: Self-pay | Admitting: Family Medicine

## 2021-05-19 MED ORDER — OXYCODONE-ACETAMINOPHEN 5-325 MG PO TABS: 1.0000 | ORAL_TABLET | ORAL | 0 refills | Status: DC | PRN

## 2021-05-25 ENCOUNTER — Other Ambulatory Visit: Payer: Self-pay

## 2021-05-25 DIAGNOSIS — M8580 Other specified disorders of bone density and structure, unspecified site: Secondary | ICD-10-CM

## 2021-05-25 MED ORDER — ALENDRONATE SODIUM 70 MG PO TABS: 70.0000 mg | ORAL_TABLET | ORAL | 11 refills | Status: DC

## 2021-05-25 NOTE — Progress Notes (Signed)
Patient has been informed per drs test results , patient verbalizes understanding. Patient agrees to fosamax prescription.

## 2021-05-26 ENCOUNTER — Ambulatory Visit (HOSPITAL_COMMUNITY): Payer: Medicare Other

## 2021-06-07 ENCOUNTER — Other Ambulatory Visit: Payer: Self-pay

## 2021-06-07 ENCOUNTER — Ambulatory Visit (HOSPITAL_COMMUNITY)
Admission: RE | Admit: 2021-06-07 | Discharge: 2021-06-07 | Disposition: A | Discharge status: Home / Self Care | Payer: Medicare Other | Source: Ambulatory Visit | Attending: Neurosurgery | Admitting: Neurosurgery

## 2021-06-07 DIAGNOSIS — G959 Disease of spinal cord, unspecified: Secondary | ICD-10-CM | POA: Insufficient documentation

## 2021-06-07 DIAGNOSIS — M542 Cervicalgia: Secondary | ICD-10-CM | POA: Diagnosis not present

## 2021-06-12 ENCOUNTER — Other Ambulatory Visit: Payer: Self-pay | Admitting: Family Medicine

## 2021-06-22 ENCOUNTER — Telehealth: Payer: Self-pay | Admitting: *Deleted

## 2021-06-22 ENCOUNTER — Other Ambulatory Visit: Payer: Self-pay | Admitting: Neurosurgery

## 2021-06-22 DIAGNOSIS — I10 Essential (primary) hypertension: Secondary | ICD-10-CM | POA: Diagnosis not present

## 2021-06-22 DIAGNOSIS — G959 Disease of spinal cord, unspecified: Secondary | ICD-10-CM | POA: Diagnosis not present

## 2021-06-22 NOTE — Telephone Encounter (Signed)
   [facility] Pre-operative Risk Assessment    Patient Name: [name]  DOB: 25-Oct-1951 MRN: [id number]  HEARTCARE STAFF:  - IMPORTANT!!!!!! Under Visit Info/Reason for Call, type in Other and utilize the format Clearance MM/DD/YY or Clearance TBD. Do not use dashes or single digits. - Please review there is not already an duplicate clearance open for this procedure. - If request is for dental extraction, please clarify the # of teeth to be extracted. - If the patient is currently at the dentist's office, call Pre-Op Callback Staff (MA/nurse) to input urgent request.  - If the patient is not currently in the dentist office, please route to the Pre-Op pool.  Request for surgical clearance:  What type of surgery is being performed? C3-4 ANTERIOR CERVICAL FUSION  When is this surgery scheduled? TBD  What type of clearance is required (medical clearance vs. Pharmacy clearance to hold med vs. Both)? MEDICAL  Are there any medications that need to be held prior to surgery and how long?  ASA  Practice name and name of physician performing surgery? [facility]; DR. GARY CRAM  What is the office phone number? [phone]   7.   What is the office fax number? [phone] ATTN: VANESSA x [phone]  8.   Anesthesia type (None, local, MAC, general) ? GENERAL   Julaine Hua 06/22/2021, 5:04 PM  _________________________________________________________________   (provider comments below)

## 2021-06-23 NOTE — Telephone Encounter (Signed)
    Patient Name: [name]  DOB: [birth year] MRN: [id number]  Primary Cardiologist: Jenkins Rouge, MD  Chart reviewed as part of pre-operative protocol coverage. Given past medical history and time since last visit, based on ACC/AHA guidelines, [name] would be at acceptable risk for the planned procedure without further cardiovascular testing.   The patient ws seen by Dr. Johnsie Cancel 05/04/21 for pre-operative clearance. The patient may hold ASA '81mg'$  3-5 days prior to procedure and resume. I will forward office note with ful clearance to requesting team.   I will route this recommendation to the requesting party via Epic fax function and remove from pre-op pool.  Please call with questions.  Kathyrn Drown, NP 06/23/2021, 8:20 AM

## 2021-06-23 NOTE — Telephone Encounter (Signed)
    Patient Name: [name]  DOB: 1951-02-18 MRN: [id number]  Primary Cardiologist: Jenkins Rouge, MD  Chart reviewed as part of pre-operative protocol coverage. Given past medical history and time since last visit, based on ACC/AHA guidelines, [name] would be at acceptable risk for the planned procedure without further cardiovascular testing.     The patient was advised that if he develops new symptoms prior to surgery to contact our office to arrange for a follow-up visit, and he verbalized understanding.  I will route this recommendation to the requesting party via Epic fax function and remove from pre-op pool.  Please call with questions.  Kathyrn Drown, NP 06/23/2021, 8:03 AM

## 2021-06-25 NOTE — Telephone Encounter (Signed)
Our office received duplicate clearance request. Our office faxed over clearance on 06/23/21 @ 8:20 am to requesting office. I will re-fax clearance again today to fax # provided [phone] attn: Lorriane Shire.

## 2021-06-28 ENCOUNTER — Other Ambulatory Visit: Payer: Self-pay | Admitting: Neurosurgery

## 2021-07-06 ENCOUNTER — Other Ambulatory Visit: Payer: Self-pay | Admitting: Family Medicine

## 2021-07-08 NOTE — Pre-Procedure Instructions (Signed)
Surgical Instructions    Your procedure is scheduled on Monday, September 19th, 2022.  Report to [facility] Main Entrance "A" at 05:30 A.M., then check in with the Admitting office.  Call this number if you have problems the morning of surgery:  [phone]   If you have any questions prior to your surgery date call [phone]: Open Monday-Friday 8am-4pm    Remember:  Do not eat or drink after midnight the night before your surgery    Take these medicines the morning of surgery with A SIP OF WATER: citalopram (CELEXA) levothyroxine (SYNTHROID) metoprolol tartrate (LOPRESSOR)  pantoprazole (PROTONIX)  Take these medicines the morning of surgery with A SIP OF WATER AS NEEDED: albuterol (PROVENTIL HFA;VENTOLIN HFA) - please bring with you day of surgery nitroGLYCERIN (NITROSTAT) - please let your nurse know if you had to take this medication  As of today, STOP taking any Aspirin (unless otherwise instructed by your surgeon) Aleve, Naproxen, Ibuprofen, Motrin, Advil, Goody's, BC's, all herbal medications, fish oil, and all vitamins.          Do not wear jewelry Do not wear lotions, powders, colognes, or deodorant. Men may shave face and neck. Do not bring valuables to the hospital.             [facility] is not responsible for any belongings or valuables.  Do NOT Smoke (Tobacco/Vaping)  24 hours prior to your procedure If you use a CPAP at night, you may bring your mask for your overnight stay.   Contacts, glasses, dentures or bridgework may not be worn into surgery, please bring cases for these belongings   For patients admitted to the hospital, discharge time will be determined by your treatment team.   Patients discharged the day of surgery will not be allowed to drive home, and someone needs to stay with them for 24 hours.  ONLY 1 SUPPORT PERSON MAY BE PRESENT WHILE YOU ARE IN SURGERY. IF YOU ARE TO BE ADMITTED ONCE YOU ARE IN YOUR ROOM YOU WILL BE ALLOWED TWO (2)  VISITORS.  Minor children may have two parents present. Special consideration for safety and communication needs will be reviewed on a case by case basis.  Special instructions:    Oral Hygiene is also important to reduce your risk of infection.  Remember - BRUSH YOUR TEETH THE MORNING OF SURGERY WITH YOUR REGULAR TOOTHPASTE   [facility]- Preparing For Surgery  Before surgery, you can play an important role. Because skin is not sterile, your skin needs to be as free of germs as possible. You can reduce the number of germs on your skin by washing with CHG (chlorahexidine gluconate) Soap before surgery.  CHG is an antiseptic cleaner which kills germs and bonds with the skin to continue killing germs even after washing.     Please do not use if you have an allergy to CHG or antibacterial soaps. If your skin becomes reddened/irritated stop using the CHG.  Do not shave (including legs and underarms) for at least 48 hours prior to first CHG shower. It is OK to shave your face.  Please follow these instructions carefully.     Shower the NIGHT BEFORE SURGERY and the MORNING OF SURGERY with CHG Soap.   If you chose to wash your hair, wash your hair first as usual with your normal shampoo. After you shampoo, rinse your hair and body thoroughly to remove the shampoo.  Then [employer] and genitals (private parts) with your normal soap  and rinse thoroughly to remove soap.  After that Use CHG Soap as you would any other liquid soap. You can apply CHG directly to the skin and wash gently with a scrungie or a clean washcloth.   Apply the CHG Soap to your body ONLY FROM THE NECK DOWN.  Do not use on open wounds or open sores. Avoid contact with your eyes, ears, mouth and genitals (private parts). Wash Face and genitals (private parts)  with your normal soap.   Wash thoroughly, paying special attention to the area where your surgery will be performed.  Thoroughly rinse your body with warm water from the  neck down.  DO NOT shower/wash with your normal soap after using and rinsing off the CHG Soap.  Pat yourself dry with a CLEAN TOWEL.  Wear CLEAN PAJAMAS to bed the night before surgery  Place CLEAN SHEETS on your bed the night before your surgery  DO NOT SLEEP WITH PETS.   Day of Surgery:  Take a shower with CHG soap. Wear Clean/Comfortable clothing the morning of surgery Do not apply any deodorants/lotions.   Remember to brush your teeth WITH YOUR REGULAR TOOTHPASTE.   Please read over the following fact sheets that you were given.

## 2021-07-09 ENCOUNTER — Encounter (HOSPITAL_COMMUNITY)
Admission: RE | Admit: 2021-07-09 | Discharge: 2021-07-09 | Disposition: A | Discharge status: Home / Self Care | Payer: Medicare Other | Source: Ambulatory Visit | Attending: Neurosurgery | Admitting: Neurosurgery

## 2021-07-09 ENCOUNTER — Encounter (HOSPITAL_COMMUNITY): Payer: Self-pay

## 2021-07-09 ENCOUNTER — Other Ambulatory Visit: Payer: Self-pay

## 2021-07-09 DIAGNOSIS — I2510 Atherosclerotic heart disease of native coronary artery without angina pectoris: Secondary | ICD-10-CM | POA: Insufficient documentation

## 2021-07-09 DIAGNOSIS — M50021 Cervical disc disorder at C4-C5 level with myelopathy: Secondary | ICD-10-CM | POA: Diagnosis not present

## 2021-07-09 DIAGNOSIS — E785 Hyperlipidemia, unspecified: Secondary | ICD-10-CM | POA: Diagnosis not present

## 2021-07-09 DIAGNOSIS — Z01812 Encounter for preprocedural laboratory examination: Secondary | ICD-10-CM | POA: Diagnosis not present

## 2021-07-09 DIAGNOSIS — I10 Essential (primary) hypertension: Secondary | ICD-10-CM | POA: Diagnosis not present

## 2021-07-09 HISTORY — DX: Pneumonia, unspecified organism: J18.9

## 2021-07-09 LAB — CBC
HCT: 40.4 % (ref 39.0–52.0)
Hemoglobin: 13 g/dL (ref 13.0–17.0)
MCH: 30.7 pg (ref 26.0–34.0)
MCHC: 32.2 g/dL (ref 30.0–36.0)
MCV: 95.3 fL (ref 80.0–100.0)
Platelets: 144 10*3/uL — ABNORMAL LOW (ref 150–400)
RBC: 4.24 MIL/uL (ref 4.22–5.81)
RDW: 13.2 % (ref 11.5–15.5)
WBC: 7.3 10*3/uL (ref 4.0–10.5)
nRBC: 0 % (ref 0.0–0.2)

## 2021-07-09 LAB — BASIC METABOLIC PANEL
Anion gap: 9 (ref 5–15)
BUN: 20 mg/dL (ref 8–23)
CO2: 27 mmol/L (ref 22–32)
Calcium: 8.9 mg/dL (ref 8.9–10.3)
Chloride: 100 mmol/L (ref 98–111)
Creatinine, Ser: 1.48 mg/dL — ABNORMAL HIGH (ref 0.61–1.24)
GFR, Estimated: 51 mL/min — ABNORMAL LOW (ref >60)
Glucose, Bld: 117 mg/dL — ABNORMAL HIGH (ref 70–99)
Potassium: 4 mmol/L (ref 3.5–5.1)
Sodium: 136 mmol/L (ref 135–145)

## 2021-07-09 LAB — SURGICAL PCR SCREEN
MRSA, PCR: NEGATIVE
Staphylococcus aureus: NEGATIVE

## 2021-07-09 NOTE — Progress Notes (Signed)
PCP - Sallee Lange, MD Cardiologist - Jenkins Rouge, MD  PPM/ICD -  n/a  Chest x-ray - n/a EKG - 05/04/21 Stress Test - 02/26/08 ECHO - 04/25/18 Cardiac Cath - 04/24/18  Sleep Study - pt denies  Aspirin Instructions: As of today, STOP taking any Aspirin (unless otherwise instructed by your surgeon) Aleve, Naproxen, Ibuprofen, Motrin, Advil, Goody's, BC's, all herbal medications, fish oil, and all vitamins.  NPO at midnight  COVID TEST- pt will be in [facility] 9/15-9/18 and will not be available to get covid tested during 4 day window. Pt will need covid test DOS.   Anesthesia review: yes, cardiac hx  Patient denies shortness of breath, fever, cough and chest pain at PAT appointment   All instructions explained to the patient, with a verbal understanding of the material. Patient agrees to go over the instructions while at home for a better understanding. Patient also instructed to self quarantine after being tested for COVID-19. The opportunity to ask questions was provided.

## 2021-07-11 ENCOUNTER — Other Ambulatory Visit: Payer: Self-pay | Admitting: Family Medicine

## 2021-07-11 DIAGNOSIS — I25118 Atherosclerotic heart disease of native coronary artery with other forms of angina pectoris: Secondary | ICD-10-CM

## 2021-07-11 DIAGNOSIS — I5042 Chronic combined systolic (congestive) and diastolic (congestive) heart failure: Secondary | ICD-10-CM

## 2021-07-11 DIAGNOSIS — R200 Anesthesia of skin: Secondary | ICD-10-CM

## 2021-07-11 DIAGNOSIS — D696 Thrombocytopenia, unspecified: Secondary | ICD-10-CM

## 2021-07-11 DIAGNOSIS — E7849 Other hyperlipidemia: Secondary | ICD-10-CM

## 2021-07-11 DIAGNOSIS — Z0000 Encounter for general adult medical examination without abnormal findings: Secondary | ICD-10-CM

## 2021-07-11 DIAGNOSIS — I10 Essential (primary) hypertension: Secondary | ICD-10-CM

## 2021-07-11 DIAGNOSIS — E038 Other specified hypothyroidism: Secondary | ICD-10-CM

## 2021-07-12 NOTE — Anesthesia Preprocedure Evaluation (Addendum)
Anesthesia Evaluation  Patient identified by MRN, date of birth, ID band Patient awake    Reviewed: Allergy & Precautions, NPO status , Patient's Chart, lab work & pertinent test results  Airway Mallampati: II  TM Distance: >3 FB Neck ROM: Limited    Dental  (+) Upper Dentures, Lower Dentures   Pulmonary shortness of breath and with exertion, former smoker,    Pulmonary exam normal        Cardiovascular hypertension, Pt. on medications and Pt. on home beta blockers + angina + CAD, + Past MI (2009), + CABG (X3, 2019) and +CHF   Rhythm:Regular Rate:Normal     Neuro/Psych negative psych ROS   GI/Hepatic Neg liver ROS, GERD  Medicated,  Endo/Other  Hypothyroidism   Renal/GU negative Renal ROS  negative genitourinary   Musculoskeletal  (+) Arthritis , Osteoarthritis,  Cervical myelopathy   Abdominal (+)  Abdomen: soft.    Peds  Hematology  (+) anemia ,   Anesthesia Other Findings   Reproductive/Obstetrics                           Anesthesia Physical Anesthesia Plan  ASA: 3  Anesthesia Plan: General   Post-op Pain Management:    Induction: Intravenous  PONV Risk Score and Plan: 2 and Ondansetron, Dexamethasone and Treatment may vary due to age or medical condition  Airway Management Planned: Mask and Oral ETT  Additional Equipment: None  Intra-op Plan:   Post-operative Plan: Extubation in OR  Informed Consent: I have reviewed the patients History and Physical, chart, labs and discussed the procedure including the risks, benefits and alternatives for the proposed anesthesia with the patient or authorized representative who has indicated his/her understanding and acceptance.     Dental advisory given  Plan Discussed with: CRNA  Anesthesia Plan Comments: (PAT note by Karoline Caldwell, PA-C: Follows with cardiology for hx of CAD (s/p prior stenting of LCx and RCA in 2003, stenting  of OM in 2012, CABG on 04/27/2018 with LIMA-LAD, SVG-OM1, and SVG-OM2), post-operative atrial fibrillation (occurring at time of CABG, no known recurrence), HTN, HLD.  Last seen by Dr. Johnsie Cancel on 05/04/2021 and cleared to have back surgery at that time.  Formal clearance per telephone encounter by Kathyrn Drown, NP 06/23/2021, "Chart reviewed as part of pre-operative protocol coverage. Given past medical history and time since last visit, based on ACC/AHA guidelines,Goble C Hordwould be at acceptable risk for the planned procedure without further cardiovascular testing. The patient ws seen by Dr. Johnsie Cancel 05/04/21 for pre-operative clearance. The patient may hold ASA '81mg'$  3-5 days prior to procedure and resume. I will forward office note with ful clearance to requesting team."  Preop labs reviewed, creatinine mildly elevated 1.48, platelets mildly low at 144, otherwise unremarkable.  EKG 05/04/2021: Sinus bradycardia with first-degree AV block with occasional premature ventricular complexes.  Rate 54.  TEE 04/27/2018 (post CABG):  Septum: No Patent Foramen Ovale present.   Left atrium: Patent foramen ovale not present.   Left atrium: Cavity is dilated and dilated.   Aortic valve: The valve is trileaflet. Mild valve thickening present.  Mild valve calcification present. Mildly decreased leaflet separation. No  stenosis. Mild regurgitation. No AV vegetation.   Mitral valve: No leaflet thickening and calcification present. Mild  regurgitation.   Right ventricle: Normal cavity size and wall thickness. Mild to  moderately reduced systolic function. No thrombus present. No mass  present.   Pulmonic valve: Trace regurgitation.  Right ventricle: Normal cavity size and ejection fraction.  TTE 04/25/2018 (pre-CABG): - Left ventricle: The cavity size was normal. Systolic function was  mildly reduced. The estimated ejection fraction was in the range  of 45% to 50%. Hypokinesis of the inferolateral,  anterior and  anteroseptal, and anterolateral myocardium. Doppler parameters  are consistent with abnormal left ventricular relaxation (grade 1  diastolic dysfunction). Doppler parameters are consistent with  high ventricular filling pressure.  - Aortic valve: Transvalvular velocity was within the normal range.  There was no stenosis. There was mild regurgitation directed  towards the mitral anterior leaflet. Regurgitation pressure  half-time: 887 ms.  - Aorta: Aortic root dimension: 39 mm (ED).  - Ascending aorta: The ascending aorta was mildly dilated.  - Mitral valve: Transvalvular velocity was within the normal range.  There was no evidence for stenosis. There was trivial  regurgitation.  - Left atrium: The atrium was severely dilated.  - Right ventricle: The cavity size was normal. Wall thickness was  normal. Systolic function was normal.  - Tricuspid valve: There was trivial regurgitation.  - Pulmonary arteries: Systolic pressure was within the normal  range.  )      Anesthesia Quick Evaluation

## 2021-07-12 NOTE — Progress Notes (Signed)
Anesthesia Chart Review:  Follows with cardiology for hx of CAD (s/p prior stenting of LCx and RCA in 2003, stenting of OM in 2012, CABG on 04/27/2018 with LIMA-LAD, SVG-OM1, and SVG-OM2), post-operative atrial fibrillation (occurring at time of CABG, no known recurrence), HTN, HLD.  Last seen by Dr. Johnsie Cancel on 05/04/2021 and cleared to have back surgery at that time.  Formal clearance per telephone encounter by Kathyrn Drown, NP 06/23/2021, "Chart reviewed as part of pre-operative protocol coverage. Given past medical history and time since last visit, based on ACC/AHA guidelines, [name] would be at acceptable risk for the planned procedure without further cardiovascular testing. The patient ws seen by Dr. Johnsie Cancel 05/04/21 for pre-operative clearance. The patient may hold ASA '81mg'$  3-5 days prior to procedure and resume. I will forward office note with ful clearance to requesting team."  Preop labs reviewed, creatinine mildly elevated 1.48, platelets mildly low at 144, otherwise unremarkable.  EKG 05/04/2021: Sinus bradycardia with first-degree AV block with occasional premature ventricular complexes.  Rate 54.  TEE 04/27/2018 (post CABG):  Septum: No Patent Foramen Ovale present.   Left atrium: Patent foramen ovale not present.   Left atrium: Cavity is dilated and dilated.   Aortic valve: The valve is trileaflet. Mild valve thickening present.  Mild valve calcification present. Mildly decreased leaflet separation. No  stenosis. Mild regurgitation. No AV vegetation.   Mitral valve: No leaflet thickening and calcification present. Mild  regurgitation.   Right ventricle: Normal cavity size and wall thickness. Mild to  moderately reduced systolic function. No thrombus present. No mass  present.   Pulmonic valve: Trace regurgitation.   Right ventricle: Normal cavity size and ejection fraction.  TTE 04/25/2018 (pre-CABG): - Left ventricle: The cavity size was normal. Systolic function was     mildly reduced. The estimated ejection fraction was in the range    of 45% to 50%. Hypokinesis of the inferolateral, anterior and    anteroseptal, and anterolateral myocardium. Doppler parameters    are consistent with abnormal left ventricular relaxation (grade 1    diastolic dysfunction). Doppler parameters are consistent with    high ventricular filling pressure.  - Aortic valve: Transvalvular velocity was within the normal range.    There was no stenosis. There was mild regurgitation directed    towards the mitral anterior leaflet. Regurgitation pressure    half-time: 887 ms.  - Aorta: Aortic root dimension: 39 mm (ED).  - Ascending aorta: The ascending aorta was mildly dilated.  - Mitral valve: Transvalvular velocity was within the normal range.    There was no evidence for stenosis. There was trivial    regurgitation.  - Left atrium: The atrium was severely dilated.  - Right ventricle: The cavity size was normal. Wall thickness was    normal. Systolic function was normal.  - Tricuspid valve: There was trivial regurgitation.  - Pulmonary arteries: Systolic pressure was within the normal    range.    Wynonia Musty [facility] Short Stay Center/Anesthesiology Phone [phone] 07/12/2021 1:27 PM

## 2021-07-15 ENCOUNTER — Telehealth: Payer: Self-pay | Admitting: *Deleted

## 2021-07-15 NOTE — Chronic Care Management (AMB) (Signed)
  Chronic Care Management   Outreach Note  07/15/2021 Name: [name] MRN: [id number] DOB: February 09, 1951  [name] is a 70 y.o. year old male who is a primary care patient of [name], Oscar Snare, MD. I reached out to [name] by phone today in response to a referral sent by [name] [facility] PCP, [name].      A telephone outreach was attempted today. The patient was referred to the case management team for assistance with care management and care coordination.   Follow Up Plan: The care management team will reach out to the patient again over the next 7 days. If patient returns call to provider office, please advise to call [facility] at [phone].  [facility]  Direct Dial: [phone]

## 2021-07-16 MED ORDER — VANCOMYCIN HCL 1500 MG/300ML IV SOLN
1500.0000 mg | INTRAVENOUS | Status: AC
  Administered 2021-07-19: 1500 mg via INTRAVENOUS
  Filled 2021-07-16 (×2): qty 300

## 2021-07-19 ENCOUNTER — Ambulatory Visit (HOSPITAL_COMMUNITY): Payer: Medicare Other

## 2021-07-19 ENCOUNTER — Encounter (HOSPITAL_COMMUNITY): Payer: Self-pay | Admitting: Neurosurgery

## 2021-07-19 ENCOUNTER — Ambulatory Visit (HOSPITAL_COMMUNITY)
Admission: RE | Admit: 2021-07-19 | Discharge: 2021-07-20 | Disposition: A | Discharge status: Home / Self Care | Payer: Medicare Other | Source: Home / Self Care | Attending: Neurosurgery | Admitting: Neurosurgery

## 2021-07-19 ENCOUNTER — Ambulatory Visit (HOSPITAL_COMMUNITY): Payer: Medicare Other | Admitting: Physician Assistant

## 2021-07-19 ENCOUNTER — Ambulatory Visit (HOSPITAL_COMMUNITY): Payer: Medicare Other | Admitting: Anesthesiology

## 2021-07-19 ENCOUNTER — Other Ambulatory Visit: Payer: Self-pay

## 2021-07-19 ENCOUNTER — Encounter (HOSPITAL_COMMUNITY)
Admission: RE | Disposition: A | Discharge status: Home / Self Care | Payer: Self-pay | Source: Home / Self Care | Attending: Neurosurgery

## 2021-07-19 DIAGNOSIS — N179 Acute kidney failure, unspecified: Secondary | ICD-10-CM | POA: Diagnosis not present

## 2021-07-19 DIAGNOSIS — Y838 Other surgical procedures as the cause of abnormal reaction of the patient, or of later complication, without mention of misadventure at the time of the procedure: Secondary | ICD-10-CM | POA: Diagnosis not present

## 2021-07-19 DIAGNOSIS — R5381 Other malaise: Secondary | ICD-10-CM | POA: Diagnosis not present

## 2021-07-19 DIAGNOSIS — M96840 Postprocedural hematoma of a musculoskeletal structure following a musculoskeletal system procedure: Secondary | ICD-10-CM | POA: Diagnosis not present

## 2021-07-19 DIAGNOSIS — M4712 Other spondylosis with myelopathy, cervical region: Secondary | ICD-10-CM | POA: Diagnosis not present

## 2021-07-19 DIAGNOSIS — I2510 Atherosclerotic heart disease of native coronary artery without angina pectoris: Secondary | ICD-10-CM | POA: Insufficient documentation

## 2021-07-19 DIAGNOSIS — Z7983 Long term (current) use of bisphosphonates: Secondary | ICD-10-CM | POA: Insufficient documentation

## 2021-07-19 DIAGNOSIS — Z7982 Long term (current) use of aspirin: Secondary | ICD-10-CM | POA: Insufficient documentation

## 2021-07-19 DIAGNOSIS — I5042 Chronic combined systolic (congestive) and diastolic (congestive) heart failure: Secondary | ICD-10-CM | POA: Insufficient documentation

## 2021-07-19 DIAGNOSIS — D696 Thrombocytopenia, unspecified: Secondary | ICD-10-CM | POA: Diagnosis not present

## 2021-07-19 DIAGNOSIS — Z87891 Personal history of nicotine dependence: Secondary | ICD-10-CM | POA: Insufficient documentation

## 2021-07-19 DIAGNOSIS — R531 Weakness: Secondary | ICD-10-CM | POA: Diagnosis not present

## 2021-07-19 DIAGNOSIS — Z888 Allergy status to other drugs, medicaments and biological substances status: Secondary | ICD-10-CM | POA: Insufficient documentation

## 2021-07-19 DIAGNOSIS — E7800 Pure hypercholesterolemia, unspecified: Secondary | ICD-10-CM | POA: Insufficient documentation

## 2021-07-19 DIAGNOSIS — M5001 Cervical disc disorder with myelopathy,  high cervical region: Secondary | ICD-10-CM | POA: Insufficient documentation

## 2021-07-19 DIAGNOSIS — I110 Hypertensive heart disease with heart failure: Secondary | ICD-10-CM | POA: Insufficient documentation

## 2021-07-19 DIAGNOSIS — G9519 Other vascular myelopathies: Secondary | ICD-10-CM | POA: Diagnosis not present

## 2021-07-19 DIAGNOSIS — Z882 Allergy status to sulfonamides status: Secondary | ICD-10-CM | POA: Insufficient documentation

## 2021-07-19 DIAGNOSIS — N319 Neuromuscular dysfunction of bladder, unspecified: Secondary | ICD-10-CM | POA: Diagnosis not present

## 2021-07-19 DIAGNOSIS — E871 Hypo-osmolality and hyponatremia: Secondary | ICD-10-CM | POA: Diagnosis not present

## 2021-07-19 DIAGNOSIS — S064XAA Epidural hemorrhage with loss of consciousness status unknown, initial encounter: Secondary | ICD-10-CM | POA: Diagnosis not present

## 2021-07-19 DIAGNOSIS — Z01818 Encounter for other preprocedural examination: Secondary | ICD-10-CM | POA: Diagnosis not present

## 2021-07-19 DIAGNOSIS — Z79899 Other long term (current) drug therapy: Secondary | ICD-10-CM | POA: Insufficient documentation

## 2021-07-19 DIAGNOSIS — M2578 Osteophyte, vertebrae: Secondary | ICD-10-CM | POA: Insufficient documentation

## 2021-07-19 DIAGNOSIS — N401 Enlarged prostate with lower urinary tract symptoms: Secondary | ICD-10-CM | POA: Diagnosis present

## 2021-07-19 DIAGNOSIS — R200 Anesthesia of skin: Secondary | ICD-10-CM | POA: Insufficient documentation

## 2021-07-19 DIAGNOSIS — M4802 Spinal stenosis, cervical region: Secondary | ICD-10-CM | POA: Insufficient documentation

## 2021-07-19 DIAGNOSIS — Z79890 Hormone replacement therapy (postmenopausal): Secondary | ICD-10-CM | POA: Insufficient documentation

## 2021-07-19 DIAGNOSIS — M4322 Fusion of spine, cervical region: Secondary | ICD-10-CM | POA: Diagnosis not present

## 2021-07-19 DIAGNOSIS — Z981 Arthrodesis status: Secondary | ICD-10-CM | POA: Diagnosis not present

## 2021-07-19 DIAGNOSIS — E039 Hypothyroidism, unspecified: Secondary | ICD-10-CM | POA: Diagnosis not present

## 2021-07-19 DIAGNOSIS — R262 Difficulty in walking, not elsewhere classified: Secondary | ICD-10-CM | POA: Diagnosis not present

## 2021-07-19 DIAGNOSIS — R2243 Localized swelling, mass and lump, lower limb, bilateral: Secondary | ICD-10-CM | POA: Diagnosis not present

## 2021-07-19 DIAGNOSIS — G8250 Quadriplegia, unspecified: Secondary | ICD-10-CM | POA: Diagnosis not present

## 2021-07-19 DIAGNOSIS — E782 Mixed hyperlipidemia: Secondary | ICD-10-CM | POA: Diagnosis not present

## 2021-07-19 DIAGNOSIS — Z6832 Body mass index (BMI) 32.0-32.9, adult: Secondary | ICD-10-CM | POA: Diagnosis not present

## 2021-07-19 DIAGNOSIS — Z951 Presence of aortocoronary bypass graft: Secondary | ICD-10-CM | POA: Insufficient documentation

## 2021-07-19 DIAGNOSIS — G8929 Other chronic pain: Secondary | ICD-10-CM | POA: Diagnosis not present

## 2021-07-19 DIAGNOSIS — K219 Gastro-esophageal reflux disease without esophagitis: Secondary | ICD-10-CM | POA: Diagnosis not present

## 2021-07-19 DIAGNOSIS — G959 Disease of spinal cord, unspecified: Secondary | ICD-10-CM | POA: Diagnosis present

## 2021-07-19 DIAGNOSIS — M47812 Spondylosis without myelopathy or radiculopathy, cervical region: Secondary | ICD-10-CM | POA: Diagnosis not present

## 2021-07-19 DIAGNOSIS — Z419 Encounter for procedure for purposes other than remedying health state, unspecified: Secondary | ICD-10-CM

## 2021-07-19 DIAGNOSIS — R1310 Dysphagia, unspecified: Secondary | ICD-10-CM | POA: Diagnosis not present

## 2021-07-19 DIAGNOSIS — G9520 Unspecified cord compression: Secondary | ICD-10-CM | POA: Diagnosis not present

## 2021-07-19 DIAGNOSIS — K592 Neurogenic bowel, not elsewhere classified: Secondary | ICD-10-CM | POA: Diagnosis not present

## 2021-07-19 DIAGNOSIS — Z87828 Personal history of other (healed) physical injury and trauma: Secondary | ICD-10-CM | POA: Diagnosis not present

## 2021-07-19 DIAGNOSIS — E785 Hyperlipidemia, unspecified: Secondary | ICD-10-CM | POA: Insufficient documentation

## 2021-07-19 DIAGNOSIS — Z955 Presence of coronary angioplasty implant and graft: Secondary | ICD-10-CM | POA: Insufficient documentation

## 2021-07-19 DIAGNOSIS — I672 Cerebral atherosclerosis: Secondary | ICD-10-CM | POA: Diagnosis not present

## 2021-07-19 DIAGNOSIS — Z880 Allergy status to penicillin: Secondary | ICD-10-CM | POA: Insufficient documentation

## 2021-07-19 DIAGNOSIS — Z23 Encounter for immunization: Secondary | ICD-10-CM | POA: Diagnosis present

## 2021-07-19 DIAGNOSIS — Z20822 Contact with and (suspected) exposure to covid-19: Secondary | ICD-10-CM | POA: Insufficient documentation

## 2021-07-19 DIAGNOSIS — G9589 Other specified diseases of spinal cord: Secondary | ICD-10-CM | POA: Diagnosis not present

## 2021-07-19 DIAGNOSIS — J341 Cyst and mucocele of nose and nasal sinus: Secondary | ICD-10-CM | POA: Diagnosis not present

## 2021-07-19 DIAGNOSIS — D693 Immune thrombocytopenic purpura: Secondary | ICD-10-CM | POA: Diagnosis not present

## 2021-07-19 DIAGNOSIS — L89309 Pressure ulcer of unspecified buttock, unspecified stage: Secondary | ICD-10-CM | POA: Diagnosis not present

## 2021-07-19 DIAGNOSIS — I252 Old myocardial infarction: Secondary | ICD-10-CM | POA: Diagnosis not present

## 2021-07-19 DIAGNOSIS — S14103A Unspecified injury at C3 level of cervical spinal cord, initial encounter: Secondary | ICD-10-CM | POA: Diagnosis not present

## 2021-07-19 DIAGNOSIS — R338 Other retention of urine: Secondary | ICD-10-CM | POA: Diagnosis not present

## 2021-07-19 DIAGNOSIS — G9752 Postprocedural hemorrhage and hematoma of a nervous system organ or structure following other procedure: Secondary | ICD-10-CM | POA: Diagnosis not present

## 2021-07-19 DIAGNOSIS — E669 Obesity, unspecified: Secondary | ICD-10-CM | POA: Diagnosis present

## 2021-07-19 DIAGNOSIS — I10 Essential (primary) hypertension: Secondary | ICD-10-CM | POA: Diagnosis not present

## 2021-07-19 HISTORY — PX: ANTERIOR CERVICAL DECOMP/DISCECTOMY FUSION: SHX1161

## 2021-07-19 LAB — SARS CORONAVIRUS 2 BY RT PCR (HOSPITAL ORDER, PERFORMED IN ~~LOC~~ HOSPITAL LAB): SARS Coronavirus 2: NEGATIVE

## 2021-07-19 SURGERY — ANTERIOR CERVICAL DECOMPRESSION/DISCECTOMY FUSION 1 LEVEL
Anesthesia: General | Site: Spine Cervical

## 2021-07-19 MED ORDER — CHLORHEXIDINE GLUCONATE CLOTH 2 % EX PADS: 6.0000 | MEDICATED_PAD | Freq: Once | CUTANEOUS | Status: DC

## 2021-07-19 MED ORDER — OXYCODONE-ACETAMINOPHEN 5-325 MG PO TABS: 1.0000 | ORAL_TABLET | ORAL | Status: DC | PRN

## 2021-07-19 MED ORDER — DEXAMETHASONE SODIUM PHOSPHATE 10 MG/ML IJ SOLN
INTRAMUSCULAR | Status: AC
  Filled 2021-07-19: qty 1

## 2021-07-19 MED ORDER — SODIUM CHLORIDE 0.9% FLUSH: 3.0000 mL | INTRAVENOUS | Status: DC | PRN

## 2021-07-19 MED ORDER — ACETAMINOPHEN 10 MG/ML IV SOLN
1000.0000 mg | Freq: Once | INTRAVENOUS | Status: DC | PRN
  Administered 2021-07-19: 1000 mg via INTRAVENOUS

## 2021-07-19 MED ORDER — ONDANSETRON HCL 4 MG/2ML IJ SOLN: 4.0000 mg | Freq: Four times a day (QID) | INTRAMUSCULAR | Status: DC | PRN

## 2021-07-19 MED ORDER — ACETAMINOPHEN 325 MG PO TABS
650.0000 mg | ORAL_TABLET | ORAL | Status: DC | PRN
  Administered 2021-07-19 – 2021-07-20 (×2): 650 mg via ORAL
  Filled 2021-07-19 (×2): qty 2

## 2021-07-19 MED ORDER — SUCCINYLCHOLINE CHLORIDE 200 MG/10ML IV SOSY
PREFILLED_SYRINGE | INTRAVENOUS | Status: DC | PRN
  Administered 2021-07-19: 120 mg via INTRAVENOUS

## 2021-07-19 MED ORDER — ROSUVASTATIN CALCIUM 20 MG PO TABS
40.0000 mg | ORAL_TABLET | Freq: Every day | ORAL | Status: DC
  Administered 2021-07-19: 40 mg via ORAL
  Filled 2021-07-19: qty 2

## 2021-07-19 MED ORDER — CYCLOBENZAPRINE HCL 10 MG PO TABS
10.0000 mg | ORAL_TABLET | Freq: Three times a day (TID) | ORAL | Status: DC | PRN
  Administered 2021-07-19 – 2021-07-20 (×3): 10 mg via ORAL
  Filled 2021-07-19 (×3): qty 1

## 2021-07-19 MED ORDER — PHENYLEPHRINE HCL-NACL 20-0.9 MG/250ML-% IV SOLN
INTRAVENOUS | Status: DC | PRN
  Administered 2021-07-19: 25 ug/min via INTRAVENOUS

## 2021-07-19 MED ORDER — PHENOL 1.4 % MT LIQD: 1.0000 | OROMUCOSAL | Status: DC | PRN

## 2021-07-19 MED ORDER — MIDAZOLAM HCL 2 MG/2ML IJ SOLN
INTRAMUSCULAR | Status: DC | PRN
  Administered 2021-07-19: 2 mg via INTRAVENOUS

## 2021-07-19 MED ORDER — THROMBIN 5000 UNITS EX SOLR
CUTANEOUS | Status: AC
  Filled 2021-07-19: qty 15000

## 2021-07-19 MED ORDER — THROMBIN 5000 UNITS EX SOLR: OROMUCOSAL | Status: DC | PRN

## 2021-07-19 MED ORDER — LIDOCAINE 2% (20 MG/ML) 5 ML SYRINGE
INTRAMUSCULAR | Status: DC | PRN
  Administered 2021-07-19: 100 mg via INTRAVENOUS

## 2021-07-19 MED ORDER — HEMOSTATIC AGENTS (NO CHARGE) OPTIME
TOPICAL | Status: DC | PRN
  Administered 2021-07-19: 1 via TOPICAL

## 2021-07-19 MED ORDER — ALENDRONATE SODIUM 70 MG PO TABS: 70.0000 mg | ORAL_TABLET | ORAL | Status: DC

## 2021-07-19 MED ORDER — FENTANYL CITRATE (PF) 250 MCG/5ML IJ SOLN
INTRAMUSCULAR | Status: AC
  Filled 2021-07-19: qty 5

## 2021-07-19 MED ORDER — LISINOPRIL 10 MG PO TABS
10.0000 mg | ORAL_TABLET | Freq: Every day | ORAL | Status: DC
  Administered 2021-07-19: 10 mg via ORAL
  Filled 2021-07-19: qty 1

## 2021-07-19 MED ORDER — ONDANSETRON HCL 4 MG/2ML IJ SOLN
INTRAMUSCULAR | Status: AC
  Filled 2021-07-19: qty 2

## 2021-07-19 MED ORDER — LACTATED RINGERS IV SOLN: INTRAVENOUS | Status: DC

## 2021-07-19 MED ORDER — ASPIRIN 81 MG PO TBEC: 81.0000 mg | DELAYED_RELEASE_TABLET | Freq: Every day | ORAL | Status: DC

## 2021-07-19 MED ORDER — CEFAZOLIN SODIUM-DEXTROSE 2-4 GM/100ML-% IV SOLN
2.0000 g | Freq: Three times a day (TID) | INTRAVENOUS | Status: AC
  Administered 2021-07-19 – 2021-07-20 (×2): 2 g via INTRAVENOUS
  Filled 2021-07-19 (×2): qty 100

## 2021-07-19 MED ORDER — NITROGLYCERIN 0.4 MG SL SUBL: 0.4000 mg | SUBLINGUAL_TABLET | SUBLINGUAL | Status: DC | PRN

## 2021-07-19 MED ORDER — LEVOTHYROXINE SODIUM 75 MCG PO TABS
75.0000 ug | ORAL_TABLET | Freq: Every day | ORAL | Status: DC
  Administered 2021-07-20: 75 ug via ORAL
  Filled 2021-07-19: qty 1

## 2021-07-19 MED ORDER — FENTANYL CITRATE (PF) 100 MCG/2ML IJ SOLN
25.0000 ug | INTRAMUSCULAR | Status: DC | PRN
  Administered 2021-07-19 (×2): 50 ug via INTRAVENOUS

## 2021-07-19 MED ORDER — ACETAMINOPHEN 650 MG RE SUPP: 650.0000 mg | RECTAL | Status: DC | PRN

## 2021-07-19 MED ORDER — ROCURONIUM BROMIDE 10 MG/ML (PF) SYRINGE
PREFILLED_SYRINGE | INTRAVENOUS | Status: DC | PRN
  Administered 2021-07-19: 50 mg via INTRAVENOUS
  Administered 2021-07-19: 20 mg via INTRAVENOUS

## 2021-07-19 MED ORDER — ALBUTEROL SULFATE HFA 108 (90 BASE) MCG/ACT IN AERS: 2.0000 | INHALATION_SPRAY | Freq: Four times a day (QID) | RESPIRATORY_TRACT | Status: DC | PRN

## 2021-07-19 MED ORDER — FENTANYL CITRATE (PF) 250 MCG/5ML IJ SOLN
INTRAMUSCULAR | Status: DC | PRN
  Administered 2021-07-19: 100 ug via INTRAVENOUS
  Administered 2021-07-19 (×3): 50 ug via INTRAVENOUS

## 2021-07-19 MED ORDER — SODIUM CHLORIDE 0.9 % IV SOLN
250.0000 mL | INTRAVENOUS | Status: DC
  Administered 2021-07-19: 250 mL via INTRAVENOUS

## 2021-07-19 MED ORDER — HYDROMORPHONE HCL 1 MG/ML IJ SOLN
0.5000 mg | INTRAMUSCULAR | Status: DC | PRN
Start: 2021-07-19 — End: 2021-07-20
  Administered 2021-07-19: 0.5 mg via INTRAVENOUS
  Filled 2021-07-19: qty 0.5

## 2021-07-19 MED ORDER — FENTANYL CITRATE (PF) 100 MCG/2ML IJ SOLN
INTRAMUSCULAR | Status: AC
  Filled 2021-07-19: qty 2

## 2021-07-19 MED ORDER — PROPOFOL 10 MG/ML IV BOLUS
INTRAVENOUS | Status: DC | PRN
  Administered 2021-07-19: 160 mg via INTRAVENOUS

## 2021-07-19 MED ORDER — 0.9 % SODIUM CHLORIDE (POUR BTL) OPTIME
TOPICAL | Status: DC | PRN
  Administered 2021-07-19: 1000 mL

## 2021-07-19 MED ORDER — SUGAMMADEX SODIUM 200 MG/2ML IV SOLN
INTRAVENOUS | Status: DC | PRN
  Administered 2021-07-19: 220 mg via INTRAVENOUS

## 2021-07-19 MED ORDER — GLYCOPYRROLATE PF 0.2 MG/ML IJ SOSY
PREFILLED_SYRINGE | INTRAMUSCULAR | Status: DC | PRN
  Administered 2021-07-19: .2 mg via INTRAVENOUS

## 2021-07-19 MED ORDER — THROMBIN 5000 UNITS EX SOLR
CUTANEOUS | Status: DC | PRN
  Administered 2021-07-19 (×2): 5000 [IU] via TOPICAL

## 2021-07-19 MED ORDER — METOPROLOL TARTRATE 25 MG PO TABS
50.0000 mg | ORAL_TABLET | Freq: Two times a day (BID) | ORAL | Status: DC
  Administered 2021-07-19: 50 mg via ORAL
  Filled 2021-07-19: qty 2

## 2021-07-19 MED ORDER — EPHEDRINE SULFATE-NACL 50-0.9 MG/10ML-% IV SOSY
PREFILLED_SYRINGE | INTRAVENOUS | Status: DC | PRN
  Administered 2021-07-19 (×2): 5 mg via INTRAVENOUS
  Administered 2021-07-19 (×2): 2.5 mg via INTRAVENOUS
  Administered 2021-07-19: 5 mg via INTRAVENOUS

## 2021-07-19 MED ORDER — POTASSIUM CHLORIDE ER 10 MEQ PO TBCR
10.0000 meq | EXTENDED_RELEASE_TABLET | Freq: Every day | ORAL | Status: DC
  Administered 2021-07-19: 10 meq via ORAL
  Filled 2021-07-19 (×2): qty 1

## 2021-07-19 MED ORDER — ORAL CARE MOUTH RINSE: 15.0000 mL | Freq: Once | OROMUCOSAL | Status: AC

## 2021-07-19 MED ORDER — TORSEMIDE 20 MG PO TABS
20.0000 mg | ORAL_TABLET | Freq: Two times a day (BID) | ORAL | Status: DC
  Administered 2021-07-19: 20 mg via ORAL
  Filled 2021-07-19 (×3): qty 1

## 2021-07-19 MED ORDER — MULTIVITAMINS PO CAPS: 1.0000 | ORAL_CAPSULE | Freq: Every day | ORAL | Status: DC

## 2021-07-19 MED ORDER — TAMSULOSIN HCL 0.4 MG PO CAPS
0.8000 mg | ORAL_CAPSULE | Freq: Every day | ORAL | Status: DC
  Administered 2021-07-19: 0.8 mg via ORAL
  Filled 2021-07-19: qty 2

## 2021-07-19 MED ORDER — MIDAZOLAM HCL 2 MG/2ML IJ SOLN
INTRAMUSCULAR | Status: AC
  Filled 2021-07-19: qty 2

## 2021-07-19 MED ORDER — ALUM & MAG HYDROXIDE-SIMETH 200-200-20 MG/5ML PO SUSP: 30.0000 mL | Freq: Four times a day (QID) | ORAL | Status: DC | PRN

## 2021-07-19 MED ORDER — ALBUTEROL SULFATE (2.5 MG/3ML) 0.083% IN NEBU: 2.5000 mg | INHALATION_SOLUTION | Freq: Four times a day (QID) | RESPIRATORY_TRACT | Status: DC | PRN

## 2021-07-19 MED ORDER — ASPIRIN EC 81 MG PO TBEC: 81.0000 mg | DELAYED_RELEASE_TABLET | Freq: Every day | ORAL | Status: DC

## 2021-07-19 MED ORDER — ONDANSETRON HCL 4 MG PO TABS: 4.0000 mg | ORAL_TABLET | Freq: Four times a day (QID) | ORAL | Status: DC | PRN

## 2021-07-19 MED ORDER — CHLORHEXIDINE GLUCONATE 0.12 % MT SOLN
15.0000 mL | Freq: Once | OROMUCOSAL | Status: AC
  Administered 2021-07-19: 15 mL via OROMUCOSAL
  Filled 2021-07-19: qty 15

## 2021-07-19 MED ORDER — OXYCODONE HCL 5 MG PO TABS
10.0000 mg | ORAL_TABLET | ORAL | Status: DC | PRN
  Administered 2021-07-19 – 2021-07-20 (×6): 10 mg via ORAL
  Filled 2021-07-19 (×6): qty 2

## 2021-07-19 MED ORDER — SODIUM CHLORIDE 0.9% FLUSH
3.0000 mL | Freq: Two times a day (BID) | INTRAVENOUS | Status: DC
  Administered 2021-07-19: 3 mL via INTRAVENOUS

## 2021-07-19 MED ORDER — ADULT MULTIVITAMIN W/MINERALS CH
1.0000 | ORAL_TABLET | Freq: Every day | ORAL | Status: DC
  Administered 2021-07-19: 1 via ORAL
  Filled 2021-07-19: qty 1

## 2021-07-19 MED ORDER — CITALOPRAM HYDROBROMIDE 20 MG PO TABS: 20.0000 mg | ORAL_TABLET | Freq: Every day | ORAL | Status: DC

## 2021-07-19 MED ORDER — LIDOCAINE 2% (20 MG/ML) 5 ML SYRINGE
INTRAMUSCULAR | Status: AC
  Filled 2021-07-19: qty 5

## 2021-07-19 MED ORDER — PROPOFOL 10 MG/ML IV BOLUS
INTRAVENOUS | Status: AC
  Filled 2021-07-19: qty 20

## 2021-07-19 MED ORDER — PANTOPRAZOLE SODIUM 40 MG IV SOLR: 40.0000 mg | Freq: Every day | INTRAVENOUS | Status: DC

## 2021-07-19 MED ORDER — ACETAMINOPHEN 10 MG/ML IV SOLN
INTRAVENOUS | Status: AC
  Filled 2021-07-19: qty 100

## 2021-07-19 MED ORDER — PANTOPRAZOLE SODIUM 40 MG PO TBEC: 40.0000 mg | DELAYED_RELEASE_TABLET | Freq: Every day | ORAL | Status: DC

## 2021-07-19 MED ORDER — ONDANSETRON HCL 4 MG/2ML IJ SOLN
INTRAMUSCULAR | Status: DC | PRN
  Administered 2021-07-19: 4 mg via INTRAVENOUS

## 2021-07-19 MED ORDER — DEXAMETHASONE SODIUM PHOSPHATE 10 MG/ML IJ SOLN
10.0000 mg | Freq: Once | INTRAMUSCULAR | Status: AC
  Administered 2021-07-19: 10 mg via INTRAVENOUS
  Filled 2021-07-19: qty 1

## 2021-07-19 MED ORDER — MENTHOL 3 MG MT LOZG: 1.0000 | LOZENGE | OROMUCOSAL | Status: DC | PRN

## 2021-07-19 SURGICAL SUPPLY — 61 items
ADH SKN CLS APL DERMABOND .7 (GAUZE/BANDAGES/DRESSINGS) ×1
APL SKNCLS STERI-STRIP NONHPOA (GAUZE/BANDAGES/DRESSINGS) ×1
BAG COUNTER SPONGE SURGICOUNT (BAG) ×3 IMPLANT
BAG SPNG CNTER NS LX DISP (BAG) ×2
BAND INSRT 18 STRL LF DISP RB (MISCELLANEOUS) ×2
BAND RUBBER #18 3X1/16 STRL (MISCELLANEOUS) ×4 IMPLANT
BASKET BONE COLLECTION (BASKET) ×2 IMPLANT
BENZOIN TINCTURE PRP APPL 2/3 (GAUZE/BANDAGES/DRESSINGS) ×2 IMPLANT
BIT DRILL NEURO 2X3.1 SFT TUCH (MISCELLANEOUS) ×1 IMPLANT
BONE VIVIGEN FORMABLE 1.3CC (Bone Implant) ×2 IMPLANT
BUR MATCHSTICK NEURO 3.0 LAGG (BURR) ×2 IMPLANT
CANISTER SUCT 3000ML PPV (MISCELLANEOUS) ×2 IMPLANT
CARTRIDGE OIL MAESTRO DRILL (MISCELLANEOUS) ×1 IMPLANT
DERMABOND ADVANCED (GAUZE/BANDAGES/DRESSINGS) ×1
DERMABOND ADVANCED .7 DNX12 (GAUZE/BANDAGES/DRESSINGS) IMPLANT
DIFFUSER DRILL AIR PNEUMATIC (MISCELLANEOUS) ×2 IMPLANT
DRAPE C-ARM 42X72 X-RAY (DRAPES) ×4 IMPLANT
DRAPE INCISE 23X17 IOBAN STRL (DRAPES) ×1
DRAPE INCISE 23X17 STRL (DRAPES) IMPLANT
DRAPE INCISE IOBAN 23X17 STRL (DRAPES) ×1 IMPLANT
DRAPE LAPAROTOMY 100X72 PEDS (DRAPES) ×2 IMPLANT
DRAPE MICROSCOPE LEICA (MISCELLANEOUS) ×2 IMPLANT
DRILL NEURO 2X3.1 SOFT TOUCH (MISCELLANEOUS) ×2
DRSG OPSITE POSTOP 4X6 (GAUZE/BANDAGES/DRESSINGS) ×1 IMPLANT
DURAPREP 6ML APPLICATOR 50/CS (WOUND CARE) ×2 IMPLANT
ELECT COATED BLADE 2.86 ST (ELECTRODE) ×2 IMPLANT
ELECT REM PT RETURN 9FT ADLT (ELECTROSURGICAL) ×2
ELECTRODE REM PT RTRN 9FT ADLT (ELECTROSURGICAL) ×1 IMPLANT
GAUZE 4X4 16PLY ~~LOC~~+RFID DBL (SPONGE) ×1 IMPLANT
GAUZE SPONGE 4X4 12PLY STRL (GAUZE/BANDAGES/DRESSINGS) ×2 IMPLANT
GLOVE SURG ENC MOIS LTX SZ7 (GLOVE) ×2 IMPLANT
GLOVE SURG ENC MOIS LTX SZ8 (GLOVE) ×3 IMPLANT
GLOVE SURG UNDER LTX SZ8.5 (GLOVE) ×3 IMPLANT
GLOVE SURG UNDER POLY LF SZ7 (GLOVE) ×2 IMPLANT
GOWN STRL REUS W/ TWL LRG LVL3 (GOWN DISPOSABLE) ×1 IMPLANT
GOWN STRL REUS W/ TWL XL LVL3 (GOWN DISPOSABLE) ×1 IMPLANT
GOWN STRL REUS W/TWL LRG LVL3 (GOWN DISPOSABLE) ×2
GOWN STRL REUS W/TWL XL LVL3 (GOWN DISPOSABLE) ×4
GRAFT BNE MATRIX VG FRMBL SM 1 (Bone Implant) IMPLANT
HALTER HD/CHIN CERV TRACTION D (MISCELLANEOUS) ×2 IMPLANT
HEMOSTAT POWDER KIT SURGIFOAM (HEMOSTASIS) ×2 IMPLANT
KIT BASIN OR (CUSTOM PROCEDURE TRAY) ×2 IMPLANT
KIT TURNOVER KIT B (KITS) ×2 IMPLANT
NDL HYPO 18GX1.5 BLUNT FILL (NEEDLE) ×1 IMPLANT
NDL SPNL 20GX3.5 QUINCKE YW (NEEDLE) ×1 IMPLANT
NEEDLE HYPO 18GX1.5 BLUNT FILL (NEEDLE) IMPLANT
NEEDLE SPNL 20GX3.5 QUINCKE YW (NEEDLE) ×2 IMPLANT
NS IRRIG 1000ML POUR BTL (IV SOLUTION) ×3 IMPLANT
OIL CARTRIDGE MAESTRO DRILL (MISCELLANEOUS) ×2
PACK LAMINECTOMY NEURO (CUSTOM PROCEDURE TRAY) ×2 IMPLANT
PLATE ANT CERV XTEND 1 LV 14 (Plate) ×1 IMPLANT
SCREW VAR 4.2 XD SELF DRILL 14 (Screw) ×4 IMPLANT
SPACER HEDRON C 12X14X7 0D (Spacer) ×1 IMPLANT
SPONGE INTESTINAL PEANUT (DISPOSABLE) ×2 IMPLANT
SPONGE SURGIFOAM ABS GEL SZ50 (HEMOSTASIS) ×2 IMPLANT
STRIP CLOSURE SKIN 1/2X4 (GAUZE/BANDAGES/DRESSINGS) ×2 IMPLANT
SUT VIC AB 3-0 SH 8-18 (SUTURE) ×3 IMPLANT
SUT VICRYL 4-0 PS2 18IN ABS (SUTURE) ×2 IMPLANT
TOWEL GREEN STERILE (TOWEL DISPOSABLE) ×2 IMPLANT
TOWEL GREEN STERILE FF (TOWEL DISPOSABLE) ×2 IMPLANT
WATER STERILE IRR 1000ML POUR (IV SOLUTION) ×2 IMPLANT

## 2021-07-19 NOTE — Plan of Care (Signed)
WNL

## 2021-07-19 NOTE — Progress Notes (Signed)
Orthopedic Tech Progress Note Patient Details:  [name] [birth year] [id number]  RN stated " patient has collar"   Patient ID: [name], male   DOB: 11/08/1950, 70 y.o.   MRN: [id number]  [name] 07/19/2021, 11:47 AM

## 2021-07-19 NOTE — H&P (Signed)
[name] is an 69 y.o. male.   Chief Complaint: Weakness numbness in arms difficulty walking HPI: 70 year old gentleman presents with weakness and numbness in his arms and hands worse on the left and difficulty walking.  Work-up revealed severe spinal cord compression at C3-4.  Due to patient's clinical exam consistent with a cervical myelopathy imaging consistent with cord compression I recommended anterior cervical discectomy and fusion at that level.  I extensively gone over the risks and benefits of that procedure with him as well as perioperative course expectations of outcome and alternatives of surgery and he understood and agreed to proceed forward.  Past Medical History:  Diagnosis Date   Allergy    Carpal tunnel syndrome    Chronic back pain    Chronic combined systolic and diastolic congestive heart failure (HCC)    Chronic ITP (idiopathic thrombocytopenia) (HCC) 05/25/2015   Coronary artery disease    Coronary artery disease involving native coronary artery of native heart with angina pectoris (HCC)    Degenerative disc disease    GERD (gastroesophageal reflux disease)    History of thrombocytopenia    Hypercholesteremia    Hypertension    Hypothyroid    Lumbar pain    Myocardial infarction ([place]) 2009   Obesity    Pneumonia    around age 11   S/P CABG x 3 04/27/2018   LIMA to LAD SVG to OM1 SVG to OM2   Shortness of breath dyspnea     Past Surgical History:  Procedure Laterality Date   BACK SURGERY     5 lumbas disc with cervical and lumbar fusions   BIOPSY N/A 03/06/2014   Procedure: ESOPHAGEAL BIOPSIES;  Surgeon: [name], MD;  Location: [facility];  Service: Endoscopy;  Laterality: N/A;   COLONOSCOPY     COLONOSCOPY WITH PROPOFOL N/A 03/06/2014   Procedure: COLONOSCOPY WITH PROPOFOL;  Surgeon: [name], MD;  Location: [facility];  Service: Endoscopy;  Laterality: N/A;  in cecum at 0807; total withdrawal time 9 minutes   CORONARY ANGIOPLASTY WITH STENT  PLACEMENT     2000, and 2004 has 3 stents   CORONARY ARTERY BYPASS GRAFT N/A 04/27/2018   Procedure: CORONARY ARTERY BYPASS GRAFTING (CABG) x Three , using left internal mammary artery and right leg greater saphenous vein;  Surgeon: [name], MD;  Location: [facility];  Service: Open Heart Surgery;  Laterality: N/A;   ESOPHAGOGASTRODUODENOSCOPY (EGD) WITH PROPOFOL N/A 03/06/2014   Procedure: ESOPHAGOGASTRODUODENOSCOPY (EGD) WITH PROPOFOL;  Surgeon: [name], MD;  Location: [facility];  Service: Endoscopy;  Laterality: N/A;   LEFT HEART CATH AND CORONARY ANGIOGRAPHY N/A 04/24/2018   Procedure: LEFT HEART CATH AND CORONARY ANGIOGRAPHY;  Surgeon: [name], MD;  Location: [facility];  Service: Cardiovascular;  Laterality: N/A;   LUMBAR FUSION  2009   MALONEY DILATION N/A 03/06/2014   Procedure: MALONEY DILATION 54 french;  Surgeon: [name], MD;  Location: [facility];  Service: Endoscopy;  Laterality: N/A;   NECK SURGERY     TEE WITHOUT CARDIOVERSION N/A 04/27/2018   Procedure: TRANSESOPHAGEAL ECHOCARDIOGRAM (TEE);  Surgeon: [name], MD;  Location: [facility];  Service: Open Heart Surgery;  Laterality: N/A;    Family History  Problem Relation Age of Onset   Heart attack Father    Heart disease Father    Colon cancer Maternal Grandmother    Esophageal cancer Neg Hx    Rectal cancer Neg Hx  Stomach cancer Neg Hx    Social History:  reports that he quit smoking about 30 years ago. His smoking use included cigarettes. He has a 40.00 pack-year smoking history. He has never used smokeless tobacco. He reports current alcohol use. He reports that he does not use drugs.  Allergies:  Allergies  Allergen Reactions   Penicillins Other (See Comments)    SYNCOPAL EPISODE  Has patient had a PCN reaction causing immediate rash, facial/tongue/throat swelling, SOB or LIGHTHEADEDNESS with HYPOTENSION [SYNCOPE] #  #  #  YES  #  #  #  Has patient had a PCN reaction causing severe  rash involving mucus membranes or skin necrosis:No Has patient had a PCN reaction that required hospitalization:No Has patient had a PCN reaction occurring within the last 10 years:Yes    Sulfa Antibiotics Swelling    SWELLING REACTION UNSPECIFIED > PER PREVIOUS PMH   Zoloft [Sertraline Hcl] Other (See Comments)    Confusion     Medications Prior to Admission  Medication Sig Dispense Refill   alendronate (FOSAMAX) 70 MG tablet Take 1 tablet (70 mg total) by mouth every 7 (seven) days. Take with a full glass of water on an empty stomach. 4 tablet 11   aspirin EC 81 MG EC tablet Take 1 tablet (81 mg total) by mouth daily.     citalopram (CELEXA) 20 MG tablet Take 1 tablet (20 mg total) by mouth every morning. 90 tablet 1   ibuprofen (ADVIL) 200 MG tablet Take 800 mg by mouth at bedtime.     levothyroxine (SYNTHROID) 150 MCG tablet TAKE 1 TABLET BY MOUTH EVERY DAY 90 tablet 1   lisinopril (ZESTRIL) 10 MG tablet Take 1 tablet (10 mg total) by mouth daily. 90 tablet 1   metoprolol tartrate (LOPRESSOR) 50 MG tablet Take 1 tablet (50 mg total) by mouth 2 (two) times daily. 180 tablet 1   Multiple Vitamin (MULTIVITAMIN) capsule Take 1 capsule by mouth daily.       nitroGLYCERIN (NITROSTAT) 0.4 MG SL tablet PLACE 1 TABLET UNDER THE TONGUE EVERY 5 MINUTES AS NEEDED FOR CHEST PAINS. 25 tablet 3   pantoprazole (PROTONIX) 40 MG tablet TAKE 1 TABLET(40 MG) BY MOUTH TWICE DAILY 180 tablet 1   potassium chloride (KLOR-CON) 10 MEQ tablet TAKE 1 TABLET(10 MEQ) BY MOUTH DAILY 90 tablet 0   rosuvastatin (CRESTOR) 40 MG tablet TAKE 1 TABLET(40 MG) BY MOUTH EVERY EVENING 90 tablet 0   tamsulosin (FLOMAX) 0.4 MG CAPS capsule TAKE 2 CAPSULES BY MOUTH AT NIGHT 180 capsule 0   torsemide (DEMADEX) 20 MG tablet TAKE 2 TABLETS(40 MG) BY MOUTH DAILY (Patient taking differently: Take 20 mg by mouth 2 (two) times daily.) 180 tablet 0   albuterol (PROVENTIL HFA;VENTOLIN HFA) 108 (90 Base) MCG/ACT inhaler Inhale 2 puffs  into the lungs every 6 (six) hours as needed for wheezing or shortness of breath. 1 Inhaler 0   oxyCODONE-acetaminophen (PERCOCET/ROXICET) 5-325 MG tablet Take 1 tablet by mouth every 4 (four) hours as needed for moderate pain. (Patient not taking: Reported on 07/06/2021) 30 tablet 0    Results for orders placed or performed during the hospital encounter of 07/19/21 (from the past 48 hour(s))  SARS Coronavirus 2 by RT PCR (hospital order, performed in [facility] hospital lab) Nasopharyngeal Nasopharyngeal Swab     Status: None   Collection Time: 07/19/21  5:51 AM   Specimen: Nasopharyngeal Swab  Result Value Ref Range   SARS Coronavirus 2 NEGATIVE NEGATIVE  Comment: (NOTE) SARS-CoV-2 target nucleic acids are NOT DETECTED.  The SARS-CoV-2 RNA is generally detectable in upper and lower respiratory specimens during the acute phase of infection. The lowest concentration of SARS-CoV-2 viral copies this assay can detect is 250 copies / mL. A negative result does not preclude SARS-CoV-2 infection and should not be used as the sole basis for treatment or other patient management decisions.  A negative result may occur with improper specimen collection / handling, submission of specimen other than nasopharyngeal swab, presence of viral mutation(s) within the areas targeted by this assay, and inadequate number of viral copies (<250 copies / mL). A negative result must be combined with clinical observations, patient history, and epidemiological information.  Fact Sheet for Patients:   StrictlyIdeas.no  Fact Sheet for Healthcare Providers: BankingDealers.co.za  This test is not yet approved or  cleared by the [place] FDA and has been authorized for detection and/or diagnosis of SARS-CoV-2 by FDA under an Emergency Use Authorization (EUA).  This EUA will remain in effect (meaning this test can be used) for the duration of the COVID-19  declaration under Section 564(b)(1) of the Act, 21 U.S.C. section 360bbb-3(b)(1), unless the authorization is terminated or revoked sooner.  Performed at [facility], [place] [address] [place], [place] [address]    No results found.  Review of Systems  Musculoskeletal:  Positive for back pain.  Neurological:  Positive for weakness.   Blood pressure (!) 172/66, pulse (!) 59, temperature 98.3 F (36.8 C), temperature source Oral, resp. rate 17, height '5\' 11"'$  (1.803 m), weight 105.2 kg, SpO2 100 %. Physical Exam HENT:     Head: Normocephalic.     Right Ear: Tympanic membrane normal.     Nose: Nose normal.  Eyes:     Pupils: Pupils are equal, round, and reactive to light.  Cardiovascular:     Rate and Rhythm: Normal rate.  Pulmonary:     Effort: Pulmonary effort is normal.  Abdominal:     General: Abdomen is flat.  Musculoskeletal:        General: Normal range of motion.  Skin:    General: Skin is warm.  Neurological:     General: No focal deficit present.     Mental Status: He is alert.     Comments: Patient is awake and alert strength is 4 to 4+ out of 5 grip strength triceps 4 out of 5 on the left 4+ out of 5 on the right otherwise 5 out of 5 lower extremities with 5 out of 5     Assessment/Plan 70 year old presents for anterior cervical discectomy fusion at  C3-4.  Elaina Hoops, MD 07/19/2021, 7:26 AM

## 2021-07-19 NOTE — Op Note (Signed)
Preoperative diagnosis: Cervical spondylitic myelopathy from severe cervical stenosis with cord compression C3-4  Postoperative diagnosis: Same  Procedure: Anterior cervical discectomy and fusion at C3-4 utilizing the globus titanium cage packed with locally harvested autograft mixed with vivigen and anterior cervical plating utilizing the globus extend plating system  Surgeon: Dominica Severin Alvey Brockel  Assistant: Nash Shearer  Anesthesia: General  EBL: Minimal  HPI: 70-year-old gentleman with progressive worsening neck pain numbness [place] weakness in his hands difficulty walking.  Work-up revealed severe spinal cord compression at C3-4.  Due to his progression of clinical syndrome imaging findings and failed conservative treatment I recommended anterior cervical discectomy and fusion at that level.  I extensively went over the risks and benefits of that operation with him as well as perioperative course expectations of outcome and alternatives to surgery and he understood and agreed to proceed forward.  Operative procedure: Patient was brought into the OR was induced under general anesthesia positioned supine the neck in slight extension past 5 pounds halter traction.  The rest of his neck was prepped and draped in routine sterile fashion.  Preoperative x-ray localized the appropriate level so a curvilinear incision was made just off the midline to the entry border of the sternocleidomastoid and the superficial of platysma was dissected out divided longitudinally.  The avascular plane between the sternomastoid and strap muscle was developed down to the prevertebral fascia and prevertebral fascia was dissected away with Kitners.  Intraoperative x-ray confirmed indication appropriate level.  Annulotomy was made with a 15 blade scalpel to mark the disc base self-retaining retractor was placed and the space was further incised removing anterior osteophytes with a 3 mm Kerrison punch.  Disc base was then drilled  down capturing the bone shavings and mucus trap.  Under microscopic lamination further drilling down the posterior osteophytic complex allowed identification of posterior annulus and posterior longitudinal ligament.  This was all removed in piecemeal fashion this allowed aggressive under biting both endplates removing significant posterior spurring, not primarily the C3 vertebral body decompressing central canal.  Marching laterally both C4 pedicles were identified and both C4 nerve roots were decompressed and skeletonized flush with pedicle.  At the end of discectomy there is no further stenosis either centrally or foraminally I sized up a 7 mm parallel cage packed with locally harvested autograft mixed with division and inserted it 1 to 2 mm deep to the anterior vertebral line.  I then selected a 14 mm globus extend plate all screws had excellent purchase locking mechanism was engaged.  Wounds are copiously irrigated some additional bone graft been packed laterally to the cage and underneath the plate and then the wound was closed in layers with opted Vicryl and a running 4 subcuticular Dermabond benzoin Steri-Strips and a sterile dressing was applied and patient recovery in stable condition.  At the end the case all needle count sponge counts were correct.

## 2021-07-19 NOTE — Progress Notes (Signed)
PHARMACIST - PHYSICIAN COMMUNICATION  CONCERNING: P&T Medication Policy Regarding Oral Bisphosphonates  RECOMMENDATION: Your order for alendronate (Fosamax), ibandronate (Boniva), or risedronate (Actonel) has been discontinued at this time.  If the patient's post-hospital medical condition warrants safe use of this class of drugs, please resume the pre-hospital regimen upon discharge.  DESCRIPTION:  Alendronate (Fosamax), ibandronate (Boniva), and risedronate (Actonel) can cause severe esophageal erosions in patients who are unable to remain upright at least 30 minutes after taking this medication.   Since brief interruptions in therapy are thought to have minimal impact on bone mineral density, the Pharmacy & Therapeutics Committee has established that bisphosphonate orders should be routinely discontinued during hospitalization.   To override this safety policy and permit administration of Boniva, Fosamax, or Actonel in the hospital, prescribers must write "DO NOT HOLD" in the comments section when placing the order for this class of medications.   Michon Kaczmarek S. Tod Abrahamsen, PharmD, BCPS Clinical Staff Pharmacist [email or web address] 

## 2021-07-19 NOTE — Transfer of Care (Signed)
Immediate Anesthesia Transfer of Care Note  Patient: [name]  Procedure(s) Performed: CERVICAL THREE-FOUR ANTERIOR CERVICAL DECOMPRESSION/DISCECTOMY FUSION (Spine Cervical)  Patient Location: PACU  Anesthesia Type:General  Level of Consciousness: awake, alert  and oriented  Airway & Oxygen Therapy: Patient Spontanous Breathing and Patient connected to face mask oxygen  Post-op Assessment: Report given to RN and Post -op Vital signs reviewed and stable  Post vital signs: Reviewed and stable  Last Vitals:  Vitals Value Taken Time  BP 152/83 07/19/21 0943  Temp    Pulse 69 07/19/21 0945  Resp 18 07/19/21 0945  SpO2 100 % 07/19/21 0945  Vitals shown include unvalidated device data.  Last Pain:  Vitals:   07/19/21 0629  TempSrc:   PainSc: 7       Patients Stated Pain Goal: 3 (12/87/86 7672)  Complications: No notable events documented.

## 2021-07-19 NOTE — Anesthesia Procedure Notes (Signed)
Procedure Name: Intubation Date/Time: 07/19/2021 7:49 AM Performed by: Bryson Corona, CRNA Pre-anesthesia Checklist: Patient identified, Emergency Drugs available, Suction available and Patient being monitored Patient Re-evaluated:Patient Re-evaluated prior to induction Oxygen Delivery Method: Circle System Utilized Preoxygenation: Pre-oxygenation with 100% oxygen Induction Type: IV induction Ventilation: Mask ventilation without difficulty Laryngoscope Size: Glidescope and 4 Grade View: Grade I Tube type: Oral Tube size: 7.0 mm Number of attempts: 1 Airway Equipment and Method: Stylet and Oral airway Placement Confirmation: ETT inserted through vocal cords under direct vision, positive ETCO2 and breath sounds checked- equal and bilateral Secured at: 22 cm Tube secured with: Tape Dental Injury: Teeth and Oropharynx as per pre-operative assessment  Difficulty Due To: Difficulty was anticipated and Difficult Airway- due to reduced neck mobility Comments: Elective Glidescope intubation for ACDF with prior neck surgery and reduced neck ROM. Grade 1 view. Atraumatic intubation.

## 2021-07-19 NOTE — Anesthesia Postprocedure Evaluation (Signed)
Anesthesia Post Note  Patient: OBINNA EHRESMAN  Procedure(s) Performed: CERVICAL THREE-FOUR ANTERIOR CERVICAL DECOMPRESSION/DISCECTOMY FUSION (Spine Cervical)     Patient location during evaluation: PACU Anesthesia Type: General Level of consciousness: awake and alert Pain management: pain level controlled Vital Signs Assessment: post-procedure vital signs reviewed and stable Respiratory status: spontaneous breathing, nonlabored ventilation, respiratory function stable and patient connected to nasal cannula oxygen Cardiovascular status: blood pressure returned to baseline and stable Postop Assessment: no apparent nausea or vomiting Anesthetic complications: no   No notable events documented.  Last Vitals:  Vitals:   07/19/21 1027 07/19/21 1051  BP: (!) 155/71 (!) 162/76  Pulse: 63 (!) 58  Resp: 17 20  Temp: 36.7 C 36.7 C  SpO2: 100% 98%    Last Pain:  Vitals:   07/19/21 1051  TempSrc: Oral  PainSc:                  March Rummage Alissa Pharr

## 2021-07-20 ENCOUNTER — Encounter (HOSPITAL_COMMUNITY): Payer: Self-pay | Admitting: Neurosurgery

## 2021-07-20 MED ORDER — OXYCODONE HCL 10 MG PO TABS: 10.0000 mg | ORAL_TABLET | ORAL | 0 refills | Status: DC | PRN

## 2021-07-20 MED ORDER — CYCLOBENZAPRINE HCL 10 MG PO TABS: 10.0000 mg | ORAL_TABLET | Freq: Three times a day (TID) | ORAL | 0 refills | Status: DC | PRN

## 2021-07-20 NOTE — Discharge Summary (Signed)
Physician Discharge Summary  Patient ID: [name] MRN: [id number] DOB/AGE: 70-Feb-1952 70 y.o. Estimated body mass index is 32.36 kg/m as calculated from the following:   Height as of this encounter: 5' 11"  (1.803 m).   Weight as of this encounter: 105.2 kg.   Admit date: 07/19/2021 Discharge date: 07/20/2021  Admission Diagnoses: Cervical spondylitic myelopathy from cervical stenosis C3-4  Discharge Diagnoses: Same Active Problems:   Myelopathy [facility])   Discharged Condition: good  Hospital Course: Patient was met hospital underwent anterior cervical discectomy and fusion at C3-4.  Postoperative patient did very well in the coming floor on the floor was ambulating and voiding spontaneously tolerating regular diet and stable for discharge home.  Patient be discharged scheduled follow-up in 1 to 2 weeks.  Consults: Significant Diagnostic Studies: Treatments: ACDF C3-4 Discharge Exam: Blood pressure 124/66, pulse (!) 55, temperature 98 F (36.7 C), temperature source Oral, resp. rate 18, height 5' 11"  (1.803 m), weight 105.2 kg, SpO2 100 %. Strength 5 out of 5 on clean dry and intact  Disposition: Home   Allergies as of 07/20/2021       Reactions   Penicillins Other (See Comments)   SYNCOPAL EPISODE Has patient had a PCN reaction causing immediate rash, facial/tongue/throat swelling, SOB or LIGHTHEADEDNESS with HYPOTENSION [SYNCOPE] #  #  #  YES  #  #  #  Has patient had a PCN reaction causing severe rash involving mucus membranes or skin necrosis:No Has patient had a PCN reaction that required hospitalization:No Has patient had a PCN reaction occurring within the last 10 years:Yes   Sulfa Antibiotics Swelling   SWELLING REACTION UNSPECIFIED > PER PREVIOUS PMH   Zoloft [sertraline Hcl] Other (See Comments)   Confusion        Medication List     TAKE these medications    albuterol 108 (90 Base) MCG/ACT inhaler Commonly known as: VENTOLIN HFA Inhale 2 puffs into  the lungs every 6 (six) hours as needed for wheezing or shortness of breath.   alendronate 70 MG tablet Commonly known as: FOSAMAX Take 1 tablet (70 mg total) by mouth every 7 (seven) days. Take with a full glass of water on an empty stomach.   aspirin 81 MG EC tablet Take 1 tablet (81 mg total) by mouth daily.   citalopram 20 MG tablet Commonly known as: CELEXA Take 1 tablet (20 mg total) by mouth every morning.   cyclobenzaprine 10 MG tablet Commonly known as: FLEXERIL Take 1 tablet (10 mg total) by mouth 3 (three) times daily as needed for muscle spasms.   ibuprofen 200 MG tablet Commonly known as: ADVIL Take 800 mg by mouth at bedtime.   levothyroxine 150 MCG tablet Commonly known as: SYNTHROID TAKE 1 TABLET BY MOUTH EVERY DAY   lisinopril 10 MG tablet Commonly known as: ZESTRIL Take 1 tablet (10 mg total) by mouth daily.   metoprolol tartrate 50 MG tablet Commonly known as: LOPRESSOR Take 1 tablet (50 mg total) by mouth 2 (two) times daily.   multivitamin capsule Take 1 capsule by mouth daily.   nitroGLYCERIN 0.4 MG SL tablet Commonly known as: NITROSTAT PLACE 1 TABLET UNDER THE TONGUE EVERY 5 MINUTES AS NEEDED FOR CHEST PAINS.   Oxycodone HCl 10 MG Tabs Take 1 tablet (10 mg total) by mouth every 3 (three) hours as needed for severe pain ((score 7 to 10)).   oxyCODONE-acetaminophen 5-325 MG tablet Commonly known as: PERCOCET/ROXICET Take 1 tablet by mouth every 4 (four)  hours as needed for moderate pain.   pantoprazole 40 MG tablet Commonly known as: PROTONIX TAKE 1 TABLET(40 MG) BY MOUTH TWICE DAILY   potassium chloride 10 MEQ tablet Commonly known as: KLOR-CON TAKE 1 TABLET(10 MEQ) BY MOUTH DAILY   rosuvastatin 40 MG tablet Commonly known as: CRESTOR TAKE 1 TABLET(40 MG) BY MOUTH EVERY EVENING   tamsulosin 0.4 MG Caps capsule Commonly known as: FLOMAX TAKE 2 CAPSULES BY MOUTH AT NIGHT   torsemide 20 MG tablet Commonly known as: DEMADEX TAKE 2  TABLETS(40 MG) BY MOUTH DAILY What changed: See the new instructions.         Signed: Elaina Hoops 07/20/2021, 7:40 AM

## 2021-07-20 NOTE — Evaluation (Signed)
Occupational Therapy Evaluation/Discharge Patient Details Name: [name] MRN: [id number] DOB: [birth year] Today's Date: 07/20/2021   History of Present Illness Pt is a 70 y/o male presenting with difficulty walking and B UE numbness. Imaging showed severe spinal cord compression at C3-C4 level. Pt elected to undergo ACDF of C3-4 region on 9/19. PMH: carpal tunnel syndrome, CHF, CAD, DDD, GERD, HTN, MI, CABG x 3 (2019), hx of back surgery (2015)   Clinical Impression   PTA, pt lives with spouse and reports typically Independent with ADLs and mobility though will use AD for mobility on a "bad day". Pt reports recent hx of frequent falls due to diagnoses above. Pt presents now s/p procedure above with improving standing balance, B hand sensation. Educated on cervical precautions for ADLs, IADLs and general mobility at home with pt verbalizing understanding. Pt able to return demo functional skills for Modified Independent completion of ADLs and reports spouse can assist with IADLs as needed (groceries, laundry, accessing bottom freezer). No further skilled OT services needed at the acute level. OT to sign off.       Recommendations for follow up therapy are one component of a multi-disciplinary discharge planning process, led by the attending physician.  Recommendations may be updated based on patient status, additional functional criteria and insurance authorization.   Follow Up Recommendations  No OT follow up    Equipment Recommendations  None recommended by OT    Recommendations for Other Services       Precautions / Restrictions Precautions Precautions: Fall;Cervical Precaution Booklet Issued: Yes (comment) Required Braces or Orthoses: Cervical Brace Cervical Brace: Soft collar;Other (comment) (remove in bed, for ADLs; can ambulate to/from bathroom without it) Restrictions Weight Bearing Restrictions: No      Mobility Bed Mobility               General bed mobility  comments: received in recliner    Transfers Overall transfer level: Independent Equipment used: None                  Balance Overall balance assessment: No apparent balance deficits (not formally assessed)                                         ADL either performed or assessed with clinical judgement   ADL Overall ADL's : Modified independent                                       General ADL Comments: After education of precautions, pt able to return demo implementation of strategies. Pt able to easily cross LEs to reach socks. Emphasized use of cups with straws to avoid excessive cervical movements. Educated on fall prevention strategies, recommended continued use of built in shower seat. Recommended assist with obtaining items from bottom freezer, carrying in groceries and laundry if unable to safely maintain cervical precautions     Vision Baseline Vision/History: 1 Wears glasses Ability to See in Adequate Light: 0 Adequate Patient Visual Report: No change from baseline Vision Assessment?: No apparent visual deficits     Perception     Praxis      Pertinent Vitals/Pain Pain Assessment: Faces Faces Pain Scale: Hurts little more Pain Location: back of neck Pain Descriptors / Indicators: Operative site guarding;Sore Pain Intervention(s): Monitored during session  Hand Dominance Right   Extremity/Trunk Assessment Upper Extremity Assessment Upper Extremity Assessment: RUE deficits/detail;LUE deficits/detail RUE Deficits / Details: strength WFL, reports improving numbness in hands; fine motor coordination WFL (hx of difficulty holding small objects due to numbness) RUE Sensation: decreased light touch RUE Coordination: decreased fine motor LUE Deficits / Details: strength WFL, improving numbness in hand LUE Sensation: decreased light touch LUE Coordination: decreased fine motor   Lower Extremity Assessment Lower Extremity  Assessment: Defer to PT evaluation   Cervical / Trunk Assessment Cervical / Trunk Assessment: Other exceptions Cervical / Trunk Exceptions: rounded shoulders; s/p ACDF with soft cervical collar in place   Communication Communication Communication: No difficulties   Cognition Arousal/Alertness: Awake/alert Behavior During Therapy: WFL for tasks assessed/performed Overall Cognitive Status: Within Functional Limits for tasks assessed                                     General Comments       Exercises     Shoulder Instructions      Home Living Family/patient expects to be discharged to:: Private residence Living Arrangements: Spouse/significant other Available Help at Discharge: Family;Available 24 hours/day Type of Home: House Home Access: Stairs to enter [employer] of Steps: 3 Entrance Stairs-Rails: Right;Left Home Layout: One level     Bathroom Shower/Tub: Occupational psychologist: Handicapped height     Home Equipment: Environmental consultant - 2 wheels;Walker - 4 wheels;Shower seat - built in;Hand held shower head;Grab bars - tub/shower          Prior Functioning/Environment Level of Independence: Independent with assistive device(s)        Comments: reports able to ambulate without AD on a good day, use of AD (more often a walker) on days where his back bothered him more. Reports recent hx of frequent falls. Typically Independent with all ADLs, IADLs        OT Problem List:        OT Treatment/Interventions:      OT Goals(Current goals can be found in the care plan section) Acute Rehab OT Goals Patient Stated Goal: go home today OT Goal Formulation: All assessment and education complete, DC therapy  OT Frequency:     Barriers to D/C:            Co-evaluation              AM-PAC OT "6 Clicks" Daily Activity     Outcome Measure Help from another person eating meals?: None Help from another person taking care of personal  grooming?: None Help from another person toileting, which includes using toliet, bedpan, or urinal?: None Help from another person bathing (including washing, rinsing, drying)?: None Help from another person to put on and taking off regular upper body clothing?: None Help from another person to put on and taking off regular lower body clothing?: None 6 Click Score: 24   End of Session Equipment Utilized During Treatment: Cervical collar Nurse Communication: Mobility status  Activity Tolerance: Patient tolerated treatment well Patient left: in bed;with call bell/phone within reach (sitting EOB eating breakfast)  OT Visit Diagnosis: Other abnormalities of gait and mobility (R26.89);Muscle weakness (generalized) (M62.81)                Time: 8250-0370 OT Time Calculation (min): 10 min Charges:  OT General Charges $OT Visit: 1 Visit OT Evaluation $OT Eval Low Complexity: 1 Low  Malachy Chamber, OTR/L Acute Rehab Services Office: [phone]   Layla Maw 07/20/2021, 7:00 AM

## 2021-07-20 NOTE — Evaluation (Signed)
Physical Therapy Evaluation and Discharge Patient Details Name: [name] MRN: [id number] DOB: 30-Oct-1951 Today's Date: 07/20/2021  History of Present Illness  Pt is a 70 y/o male presenting with difficulty walking and B UE numbness. Imaging showed severe spinal cord compression at C3-C4 level. Pt elected to undergo ACDF of C3-4 region on 9/19. PMH: carpal tunnel syndrome, CHF, CAD, DDD, GERD, HTN, MI, CABG x 3 (2019), hx of back surgery (2015)  Clinical Impression  Patient evaluated by Physical Therapy with no further acute PT needs identified. Pt ambulating 400 feet with no assistive device at a supervision level. Negotiated 5 steps with a railing to simulate home set up. Recommended use of walker for longer distances or unlevel surfaces as pt with worsening antalgic gait pattern with increased distance. Pt demonstrates dynamic balance deficits and gait abnormalities. Presents as a fall risk based on decreased gait speed and history of falls. Would benefit from OPPT to address. All education has been completed and the patient has no further questions. PT is signing off. Thank you for this referral.      Recommendations for follow up therapy are one component of a multi-disciplinary discharge planning process, led by the attending physician.  Recommendations may be updated based on patient status, additional functional criteria and insurance authorization.  Follow Up Recommendations Outpatient PT;Supervision for mobility/OOB    Equipment Recommendations  None recommended by PT    Recommendations for Other Services       Precautions / Restrictions Precautions Precautions: Fall;Cervical Precaution Booklet Issued: Yes (comment) Required Braces or Orthoses: Cervical Brace Cervical Brace: Soft collar;Other (comment) (remove in bed, for ADLs; can ambulate to/from bathroom without it) Restrictions Weight Bearing Restrictions: No      Mobility  Bed Mobility               General  bed mobility comments: received sitting EOB    Transfers Overall transfer level: Independent Equipment used: None                Ambulation/Gait Ambulation/Gait assistance: Supervision Gait Distance (Feet): 400 Feet Assistive device: None Gait Pattern/deviations: Step-through pattern;Antalgic;Decreased stride length     General Gait Details: Cues for upright posture, noted increased antalgic gait pattern with increased distance and reaching intermittently for external support of hall railing (pt reports this is baseline since TKA). Supervision overall for safety  Stairs Stairs: Yes Stairs assistance: Min guard Stair Management: One rail Right Number of Stairs: 5 General stair comments: cues for step by step, min guard for safety  Wheelchair Mobility    Modified Rankin (Stroke Patients Only)       Balance Overall balance assessment: Mild deficits observed, not formally tested                                           Pertinent Vitals/Pain Pain Assessment: Faces Faces Pain Scale: Hurts little more Pain Location: back of neck Pain Descriptors / Indicators: Operative site guarding;Sore Pain Intervention(s): Monitored during session    Home Living Family/patient expects to be discharged to:: Private residence Living Arrangements: Spouse/significant other Available Help at Discharge: Family;Available 24 hours/day Type of Home: House Home Access: Stairs to enter Entrance Stairs-Rails: Psychiatric nurse of Steps: 3 Home Layout: One level Home Equipment: Walker - 2 wheels;Walker - 4 wheels;Shower seat - built in;Hand held shower head;Grab bars - tub/shower  Prior Function Level of Independence: Independent with assistive device(s)         Comments: reports able to ambulate without AD on a good day, use of AD (more often a walker) on days where his back bothered him more. Reports recent hx of frequent falls. Typically  Independent with all ADLs, IADLs     Hand Dominance   Dominant Hand: Right    Extremity/Trunk Assessment   Upper Extremity Assessment Upper Extremity Assessment: Defer to OT evaluation RUE Deficits / Details: strength WFL, reports improving numbness in hands; fine motor coordination WFL (hx of difficulty holding small objects due to numbness) RUE Sensation: decreased light touch RUE Coordination: decreased fine motor LUE Deficits / Details: strength WFL, improving numbness in hand LUE Sensation: decreased light touch LUE Coordination: decreased fine motor    Lower Extremity Assessment Lower Extremity Assessment: LLE deficits/detail LLE Deficits / Details: Hx TKA    Cervical / Trunk Assessment Cervical / Trunk Assessment: Other exceptions Cervical / Trunk Exceptions: rounded shoulders; s/p ACDF with soft cervical collar in place  Communication   Communication: No difficulties  Cognition Arousal/Alertness: Awake/alert Behavior During Therapy: WFL for tasks assessed/performed Overall Cognitive Status: Within Functional Limits for tasks assessed                                        General Comments      Exercises     Assessment/Plan    PT Assessment Patent does not need any further PT services  PT Problem List Decreased strength;Decreased activity tolerance;Decreased balance;Decreased mobility;Pain       PT Treatment Interventions      PT Goals (Current goals can be found in the Care Plan section)  Acute Rehab PT Goals Patient Stated Goal: go home today PT Goal Formulation: All assessment and education complete, DC therapy    Frequency     Barriers to discharge        Co-evaluation               AM-PAC PT "6 Clicks" Mobility  Outcome Measure Help needed turning from your back to your side while in a flat bed without using bedrails?: None Help needed moving from lying on your back to sitting on the side of a flat bed without using  bedrails?: None Help needed moving to and from a bed to a chair (including a wheelchair)?: None Help needed standing up from a chair using your arms (e.g., wheelchair or bedside chair)?: None Help needed to walk in hospital room?: A Little Help needed climbing 3-5 steps with a railing? : A Little 6 Click Score: 22    End of Session Equipment Utilized During Treatment: Cervical collar Activity Tolerance: Patient tolerated treatment well Patient left: in bed;with call bell/phone within reach Nurse Communication: Mobility status PT Visit Diagnosis: Unsteadiness on feet (R26.81);History of falling (Z91.81)    Time: 7371-0626 PT Time Calculation (min) (ACUTE ONLY): 11 min   Charges:   PT Evaluation $PT Eval Low Complexity: [place], PT, DPT Acute Rehabilitation Services Pager [phone] Office [phone]   Deno Etienne 07/20/2021, 9:53 AM

## 2021-07-20 NOTE — Discharge Instructions (Signed)
Wound Care ° °Keep the incision clean and dry remove the outer dressing in 2 days, leave the Steri-Strips intact. Wrap with Saran wrap for showers only °Do not put any creams, lotions, or ointments on incision. °Leave steri-strips on neck.  They will fall off by themselves. ° °Activity °Walk each and every day, increasing distance each day. °No lifting greater than 5 lbs.  Avoid excessive neck motion. °No lifting no bending no twisting no driving or riding a car unless coming back and forth to see me. °Wear neck brace at all times except when showering.  ° °Diet °Resume your normal diet.  ° °Return to Work °Will be discussed at you follow up appointment. ° °Call Your Doctor If Any of These Occur °Redness, drainage, or swelling at the wound.  °Temperature greater than 101 degrees. °Severe pain not relieved by pain medication. °Incision starts to come apart. °Follow Up Appt °Call today for appointment in 1-2 weeks ([phone]) or for problems.  If you have any hardware placed in your spine, you will need an x-ray before your appointment. ° ° °

## 2021-07-20 NOTE — Progress Notes (Signed)
Patient alert and oriented, mae's well, voiding adequate amount of urine, swallowing without difficulty, no c/o pain at time of discharge. Patient discharged home with family. Script and discharged instructions given to patient. Patient and family stated understanding of instructions given. Patient has an appointment with Dr. Cram in 2 weeks 

## 2021-07-22 ENCOUNTER — Inpatient Hospital Stay (HOSPITAL_COMMUNITY): Payer: Medicare Other | Admitting: Anesthesiology

## 2021-07-22 ENCOUNTER — Inpatient Hospital Stay (HOSPITAL_COMMUNITY): Payer: Medicare Other

## 2021-07-22 ENCOUNTER — Inpatient Hospital Stay (HOSPITAL_COMMUNITY)
Admission: EM | Admit: 2021-07-22 | Discharge: 2021-08-01 | DRG: 472 | Disposition: A | Discharge status: Rehabilitation Facility | Payer: Medicare Other | Attending: Neurological Surgery | Admitting: Neurological Surgery

## 2021-07-22 ENCOUNTER — Encounter (HOSPITAL_COMMUNITY)
Admission: EM | Disposition: A | Discharge status: Rehabilitation Facility | Payer: Self-pay | Source: Home / Self Care | Attending: Neurological Surgery

## 2021-07-22 ENCOUNTER — Emergency Department (HOSPITAL_COMMUNITY): Payer: Medicare Other

## 2021-07-22 ENCOUNTER — Encounter (HOSPITAL_COMMUNITY): Payer: Self-pay | Admitting: *Deleted

## 2021-07-22 ENCOUNTER — Other Ambulatory Visit: Payer: Self-pay

## 2021-07-22 DIAGNOSIS — G8929 Other chronic pain: Secondary | ICD-10-CM | POA: Diagnosis present

## 2021-07-22 DIAGNOSIS — Z20822 Contact with and (suspected) exposure to covid-19: Secondary | ICD-10-CM | POA: Diagnosis not present

## 2021-07-22 DIAGNOSIS — L89309 Pressure ulcer of unspecified buttock, unspecified stage: Secondary | ICD-10-CM | POA: Diagnosis not present

## 2021-07-22 DIAGNOSIS — Z7983 Long term (current) use of bisphosphonates: Secondary | ICD-10-CM

## 2021-07-22 DIAGNOSIS — I2510 Atherosclerotic heart disease of native coronary artery without angina pectoris: Secondary | ICD-10-CM | POA: Diagnosis present

## 2021-07-22 DIAGNOSIS — R1310 Dysphagia, unspecified: Secondary | ICD-10-CM | POA: Diagnosis not present

## 2021-07-22 DIAGNOSIS — Z951 Presence of aortocoronary bypass graft: Secondary | ICD-10-CM

## 2021-07-22 DIAGNOSIS — I10 Essential (primary) hypertension: Secondary | ICD-10-CM | POA: Diagnosis not present

## 2021-07-22 DIAGNOSIS — Z87828 Personal history of other (healed) physical injury and trauma: Secondary | ICD-10-CM | POA: Diagnosis not present

## 2021-07-22 DIAGNOSIS — I252 Old myocardial infarction: Secondary | ICD-10-CM | POA: Diagnosis not present

## 2021-07-22 DIAGNOSIS — K219 Gastro-esophageal reflux disease without esophagitis: Secondary | ICD-10-CM | POA: Diagnosis present

## 2021-07-22 DIAGNOSIS — I5042 Chronic combined systolic (congestive) and diastolic (congestive) heart failure: Secondary | ICD-10-CM | POA: Diagnosis not present

## 2021-07-22 DIAGNOSIS — M4802 Spinal stenosis, cervical region: Secondary | ICD-10-CM | POA: Diagnosis present

## 2021-07-22 DIAGNOSIS — E7800 Pure hypercholesterolemia, unspecified: Secondary | ICD-10-CM | POA: Diagnosis not present

## 2021-07-22 DIAGNOSIS — R338 Other retention of urine: Secondary | ICD-10-CM | POA: Diagnosis present

## 2021-07-22 DIAGNOSIS — D696 Thrombocytopenia, unspecified: Secondary | ICD-10-CM | POA: Diagnosis present

## 2021-07-22 DIAGNOSIS — Z7982 Long term (current) use of aspirin: Secondary | ICD-10-CM

## 2021-07-22 DIAGNOSIS — R531 Weakness: Secondary | ICD-10-CM | POA: Diagnosis not present

## 2021-07-22 DIAGNOSIS — M4712 Other spondylosis with myelopathy, cervical region: Principal | ICD-10-CM | POA: Diagnosis present

## 2021-07-22 DIAGNOSIS — S064X9A Epidural hemorrhage with loss of consciousness of unspecified duration, initial encounter: Secondary | ICD-10-CM | POA: Diagnosis present

## 2021-07-22 DIAGNOSIS — G9519 Other vascular myelopathies: Secondary | ICD-10-CM | POA: Diagnosis not present

## 2021-07-22 DIAGNOSIS — M2578 Osteophyte, vertebrae: Secondary | ICD-10-CM | POA: Diagnosis present

## 2021-07-22 DIAGNOSIS — Y838 Other surgical procedures as the cause of abnormal reaction of the patient, or of later complication, without mention of misadventure at the time of the procedure: Secondary | ICD-10-CM | POA: Diagnosis not present

## 2021-07-22 DIAGNOSIS — I110 Hypertensive heart disease with heart failure: Secondary | ICD-10-CM | POA: Diagnosis present

## 2021-07-22 DIAGNOSIS — Z862 Personal history of diseases of the blood and blood-forming organs and certain disorders involving the immune mechanism: Secondary | ICD-10-CM

## 2021-07-22 DIAGNOSIS — N401 Enlarged prostate with lower urinary tract symptoms: Secondary | ICD-10-CM | POA: Diagnosis present

## 2021-07-22 DIAGNOSIS — Z981 Arthrodesis status: Secondary | ICD-10-CM

## 2021-07-22 DIAGNOSIS — S064XAA Epidural hemorrhage with loss of consciousness status unknown, initial encounter: Secondary | ICD-10-CM | POA: Diagnosis not present

## 2021-07-22 DIAGNOSIS — D693 Immune thrombocytopenic purpura: Secondary | ICD-10-CM | POA: Diagnosis present

## 2021-07-22 DIAGNOSIS — E871 Hypo-osmolality and hyponatremia: Secondary | ICD-10-CM | POA: Diagnosis not present

## 2021-07-22 DIAGNOSIS — G959 Disease of spinal cord, unspecified: Secondary | ICD-10-CM | POA: Diagnosis not present

## 2021-07-22 DIAGNOSIS — R2243 Localized swelling, mass and lump, lower limb, bilateral: Secondary | ICD-10-CM | POA: Diagnosis not present

## 2021-07-22 DIAGNOSIS — M96840 Postprocedural hematoma of a musculoskeletal structure following a musculoskeletal system procedure: Secondary | ICD-10-CM | POA: Diagnosis not present

## 2021-07-22 DIAGNOSIS — G9752 Postprocedural hemorrhage and hematoma of a nervous system organ or structure following other procedure: Secondary | ICD-10-CM | POA: Diagnosis not present

## 2021-07-22 DIAGNOSIS — G9589 Other specified diseases of spinal cord: Secondary | ICD-10-CM | POA: Diagnosis not present

## 2021-07-22 DIAGNOSIS — N179 Acute kidney failure, unspecified: Secondary | ICD-10-CM | POA: Diagnosis not present

## 2021-07-22 DIAGNOSIS — Z955 Presence of coronary angioplasty implant and graft: Secondary | ICD-10-CM

## 2021-07-22 DIAGNOSIS — E669 Obesity, unspecified: Secondary | ICD-10-CM | POA: Diagnosis present

## 2021-07-22 DIAGNOSIS — Z888 Allergy status to other drugs, medicaments and biological substances status: Secondary | ICD-10-CM

## 2021-07-22 DIAGNOSIS — N319 Neuromuscular dysfunction of bladder, unspecified: Secondary | ICD-10-CM | POA: Diagnosis not present

## 2021-07-22 DIAGNOSIS — E039 Hypothyroidism, unspecified: Secondary | ICD-10-CM | POA: Diagnosis not present

## 2021-07-22 DIAGNOSIS — I672 Cerebral atherosclerosis: Secondary | ICD-10-CM | POA: Diagnosis not present

## 2021-07-22 DIAGNOSIS — R262 Difficulty in walking, not elsewhere classified: Secondary | ICD-10-CM | POA: Diagnosis present

## 2021-07-22 DIAGNOSIS — S14103A Unspecified injury at C3 level of cervical spinal cord, initial encounter: Secondary | ICD-10-CM | POA: Diagnosis not present

## 2021-07-22 DIAGNOSIS — Z6832 Body mass index (BMI) 32.0-32.9, adult: Secondary | ICD-10-CM

## 2021-07-22 DIAGNOSIS — Z23 Encounter for immunization: Secondary | ICD-10-CM | POA: Diagnosis present

## 2021-07-22 DIAGNOSIS — E785 Hyperlipidemia, unspecified: Secondary | ICD-10-CM | POA: Diagnosis present

## 2021-07-22 DIAGNOSIS — Z880 Allergy status to penicillin: Secondary | ICD-10-CM

## 2021-07-22 DIAGNOSIS — M4322 Fusion of spine, cervical region: Secondary | ICD-10-CM | POA: Diagnosis not present

## 2021-07-22 DIAGNOSIS — Z419 Encounter for procedure for purposes other than remedying health state, unspecified: Secondary | ICD-10-CM

## 2021-07-22 DIAGNOSIS — Z79899 Other long term (current) drug therapy: Secondary | ICD-10-CM

## 2021-07-22 DIAGNOSIS — Z882 Allergy status to sulfonamides status: Secondary | ICD-10-CM

## 2021-07-22 DIAGNOSIS — J341 Cyst and mucocele of nose and nasal sinus: Secondary | ICD-10-CM | POA: Diagnosis not present

## 2021-07-22 DIAGNOSIS — K5900 Constipation, unspecified: Secondary | ICD-10-CM | POA: Diagnosis present

## 2021-07-22 DIAGNOSIS — K592 Neurogenic bowel, not elsewhere classified: Secondary | ICD-10-CM | POA: Diagnosis present

## 2021-07-22 DIAGNOSIS — G8250 Quadriplegia, unspecified: Secondary | ICD-10-CM | POA: Diagnosis not present

## 2021-07-22 DIAGNOSIS — Z87891 Personal history of nicotine dependence: Secondary | ICD-10-CM

## 2021-07-22 DIAGNOSIS — M47812 Spondylosis without myelopathy or radiculopathy, cervical region: Secondary | ICD-10-CM | POA: Diagnosis not present

## 2021-07-22 DIAGNOSIS — Z01818 Encounter for other preprocedural examination: Secondary | ICD-10-CM | POA: Diagnosis not present

## 2021-07-22 DIAGNOSIS — Z79890 Hormone replacement therapy (postmenopausal): Secondary | ICD-10-CM

## 2021-07-22 HISTORY — PX: ANTERIOR CERVICAL DECOMP/DISCECTOMY FUSION: SHX1161

## 2021-07-22 LAB — CBC WITH DIFFERENTIAL/PLATELET
Abs Immature Granulocytes: 0.03 10*3/uL (ref 0.00–0.07)
Basophils Absolute: 0 10*3/uL (ref 0.0–0.1)
Basophils Relative: 1 %
Eosinophils Absolute: 0.3 10*3/uL (ref 0.0–0.5)
Eosinophils Relative: 5 %
HCT: 39.2 % (ref 39.0–52.0)
Hemoglobin: 12.3 g/dL — ABNORMAL LOW (ref 13.0–17.0)
Immature Granulocytes: 1 %
Lymphocytes Relative: 14 %
Lymphs Abs: 0.9 10*3/uL (ref 0.7–4.0)
MCH: 30.1 pg (ref 26.0–34.0)
MCHC: 31.4 g/dL (ref 30.0–36.0)
MCV: 96.1 fL (ref 80.0–100.0)
Monocytes Absolute: 0.6 10*3/uL (ref 0.1–1.0)
Monocytes Relative: 10 %
Neutro Abs: 4.6 10*3/uL (ref 1.7–7.7)
Neutrophils Relative %: 69 %
Platelets: 108 10*3/uL — ABNORMAL LOW (ref 150–400)
RBC: 4.08 MIL/uL — ABNORMAL LOW (ref 4.22–5.81)
RDW: 13.2 % (ref 11.5–15.5)
WBC: 6.5 10*3/uL (ref 4.0–10.5)
nRBC: 0 % (ref 0.0–0.2)

## 2021-07-22 LAB — LACTIC ACID, PLASMA: Lactic Acid, Venous: 0.8 mmol/L (ref 0.5–1.9)

## 2021-07-22 LAB — I-STAT CHEM 8, ED
BUN: 23 mg/dL (ref 8–23)
Calcium, Ion: 1.1 mmol/L — ABNORMAL LOW (ref 1.15–1.40)
Chloride: 99 mmol/L (ref 98–111)
Creatinine, Ser: 1.4 mg/dL — ABNORMAL HIGH (ref 0.61–1.24)
Glucose, Bld: 102 mg/dL — ABNORMAL HIGH (ref 70–99)
HCT: 38 % — ABNORMAL LOW (ref 39.0–52.0)
Hemoglobin: 12.9 g/dL — ABNORMAL LOW (ref 13.0–17.0)
Potassium: 3.9 mmol/L (ref 3.5–5.1)
Sodium: 136 mmol/L (ref 135–145)
TCO2: 27 mmol/L (ref 22–32)

## 2021-07-22 LAB — COMPREHENSIVE METABOLIC PANEL
ALT: 9 U/L (ref 0–44)
AST: 13 U/L — ABNORMAL LOW (ref 15–41)
Albumin: 3.9 g/dL (ref 3.5–5.0)
Alkaline Phosphatase: 60 U/L (ref 38–126)
Anion gap: 9 (ref 5–15)
BUN: 23 mg/dL (ref 8–23)
CO2: 28 mmol/L (ref 22–32)
Calcium: 9.2 mg/dL (ref 8.9–10.3)
Chloride: 99 mmol/L (ref 98–111)
Creatinine, Ser: 1.45 mg/dL — ABNORMAL HIGH (ref 0.61–1.24)
GFR, Estimated: 52 mL/min — ABNORMAL LOW (ref >60)
Glucose, Bld: 109 mg/dL — ABNORMAL HIGH (ref 70–99)
Potassium: 4.1 mmol/L (ref 3.5–5.1)
Sodium: 136 mmol/L (ref 135–145)
Total Bilirubin: 0.7 mg/dL (ref 0.3–1.2)
Total Protein: 6.8 g/dL (ref 6.5–8.1)

## 2021-07-22 LAB — RESP PANEL BY RT-PCR (FLU A&B, COVID) ARPGX2
Influenza A by PCR: NEGATIVE
Influenza B by PCR: NEGATIVE
SARS Coronavirus 2 by RT PCR: NEGATIVE

## 2021-07-22 LAB — TYPE AND SCREEN
ABO/RH(D): O POS
Antibody Screen: NEGATIVE

## 2021-07-22 LAB — CBG MONITORING, ED: Glucose-Capillary: 113 mg/dL — ABNORMAL HIGH (ref 70–99)

## 2021-07-22 LAB — SURGICAL PCR SCREEN
MRSA, PCR: NEGATIVE
Staphylococcus aureus: NEGATIVE

## 2021-07-22 LAB — APTT: aPTT: 30 seconds (ref 24–36)

## 2021-07-22 LAB — PROTIME-INR
INR: 1 (ref 0.8–1.2)
Prothrombin Time: 13.1 seconds (ref 11.4–15.2)

## 2021-07-22 LAB — HIV ANTIBODY (ROUTINE TESTING W REFLEX): HIV Screen 4th Generation wRfx: NONREACTIVE

## 2021-07-22 SURGERY — ANTERIOR CERVICAL DECOMPRESSION/DISCECTOMY FUSION 1 LEVEL
Anesthesia: General

## 2021-07-22 MED ORDER — SODIUM CHLORIDE 0.9 % IV SOLN: 250.0000 mL | INTRAVENOUS | Status: DC

## 2021-07-22 MED ORDER — ALBUTEROL SULFATE (2.5 MG/3ML) 0.083% IN NEBU: 3.0000 mL | INHALATION_SOLUTION | Freq: Four times a day (QID) | RESPIRATORY_TRACT | Status: DC | PRN

## 2021-07-22 MED ORDER — PHENOL 1.4 % MT LIQD: 1.0000 | OROMUCOSAL | Status: DC | PRN

## 2021-07-22 MED ORDER — CITALOPRAM HYDROBROMIDE 20 MG PO TABS
20.0000 mg | ORAL_TABLET | Freq: Every morning | ORAL | Status: DC
  Administered 2021-07-23 – 2021-08-01 (×10): 20 mg via ORAL
  Filled 2021-07-22 (×8): qty 1

## 2021-07-22 MED ORDER — ROSUVASTATIN CALCIUM 20 MG PO TABS
40.0000 mg | ORAL_TABLET | Freq: Every day | ORAL | Status: DC
  Administered 2021-07-22 – 2021-08-01 (×11): 40 mg via ORAL
  Filled 2021-07-22 (×12): qty 2

## 2021-07-22 MED ORDER — DEXAMETHASONE SODIUM PHOSPHATE 10 MG/ML IJ SOLN
INTRAMUSCULAR | Status: DC | PRN
  Administered 2021-07-22: 10 mg via INTRAVENOUS

## 2021-07-22 MED ORDER — ACETAMINOPHEN 650 MG RE SUPP: 650.0000 mg | Freq: Four times a day (QID) | RECTAL | Status: DC | PRN

## 2021-07-22 MED ORDER — PROPOFOL 10 MG/ML IV BOLUS
INTRAVENOUS | Status: AC
  Filled 2021-07-22: qty 20

## 2021-07-22 MED ORDER — TAMSULOSIN HCL 0.4 MG PO CAPS
0.8000 mg | ORAL_CAPSULE | Freq: Every day | ORAL | Status: DC
  Administered 2021-07-22 – 2021-07-31 (×10): 0.8 mg via ORAL
  Filled 2021-07-22 (×10): qty 2

## 2021-07-22 MED ORDER — ADULT MULTIVITAMIN W/MINERALS CH
1.0000 | ORAL_TABLET | Freq: Every day | ORAL | Status: DC
  Administered 2021-07-23 – 2021-08-01 (×10): 1 via ORAL
  Filled 2021-07-22 (×10): qty 1

## 2021-07-22 MED ORDER — ONDANSETRON HCL 4 MG/2ML IJ SOLN: 4.0000 mg | Freq: Four times a day (QID) | INTRAMUSCULAR | Status: DC | PRN

## 2021-07-22 MED ORDER — MIDAZOLAM HCL 2 MG/2ML IJ SOLN
INTRAMUSCULAR | Status: DC | PRN
  Administered 2021-07-22: 2 mg via INTRAVENOUS

## 2021-07-22 MED ORDER — MENTHOL 3 MG MT LOZG: 1.0000 | LOZENGE | OROMUCOSAL | Status: DC | PRN

## 2021-07-22 MED ORDER — POTASSIUM CHLORIDE CRYS ER 10 MEQ PO TBCR
10.0000 meq | EXTENDED_RELEASE_TABLET | Freq: Every day | ORAL | Status: DC
  Administered 2021-07-23 – 2021-08-01 (×10): 10 meq via ORAL
  Filled 2021-07-22 (×11): qty 1

## 2021-07-22 MED ORDER — FENTANYL CITRATE (PF) 100 MCG/2ML IJ SOLN: 25.0000 ug | INTRAMUSCULAR | Status: DC | PRN

## 2021-07-22 MED ORDER — ROCURONIUM BROMIDE 10 MG/ML (PF) SYRINGE
PREFILLED_SYRINGE | INTRAVENOUS | Status: DC | PRN
  Administered 2021-07-22: 40 mg via INTRAVENOUS
  Administered 2021-07-22: 60 mg via INTRAVENOUS

## 2021-07-22 MED ORDER — OXYCODONE HCL 5 MG/5ML PO SOLN: 5.0000 mg | Freq: Once | ORAL | Status: DC | PRN

## 2021-07-22 MED ORDER — MORPHINE SULFATE (PF) 4 MG/ML IV SOLN
4.0000 mg | Freq: Once | INTRAVENOUS | Status: AC
Start: 2021-07-22 — End: 2021-07-22
  Administered 2021-07-22: 4 mg via INTRAVENOUS
  Filled 2021-07-22: qty 1

## 2021-07-22 MED ORDER — DOCUSATE SODIUM 100 MG PO CAPS
100.0000 mg | ORAL_CAPSULE | Freq: Two times a day (BID) | ORAL | Status: DC
  Administered 2021-07-22 – 2021-08-01 (×20): 100 mg via ORAL
  Filled 2021-07-22 (×20): qty 1

## 2021-07-22 MED ORDER — 0.9 % SODIUM CHLORIDE (POUR BTL) OPTIME
TOPICAL | Status: DC | PRN
  Administered 2021-07-22: 1000 mL

## 2021-07-22 MED ORDER — SODIUM CHLORIDE 0.9% FLUSH
3.0000 mL | Freq: Two times a day (BID) | INTRAVENOUS | Status: DC
  Administered 2021-07-23 – 2021-08-01 (×18): 3 mL via INTRAVENOUS

## 2021-07-22 MED ORDER — LISINOPRIL 10 MG PO TABS
10.0000 mg | ORAL_TABLET | Freq: Every day | ORAL | Status: DC
  Administered 2021-07-23 – 2021-08-01 (×10): 10 mg via ORAL
  Filled 2021-07-22 (×11): qty 1

## 2021-07-22 MED ORDER — LACTATED RINGERS IV SOLN: INTRAVENOUS | Status: DC

## 2021-07-22 MED ORDER — LIDOCAINE 2% (20 MG/ML) 5 ML SYRINGE
INTRAMUSCULAR | Status: DC | PRN
  Administered 2021-07-22: 60 mg via INTRAVENOUS

## 2021-07-22 MED ORDER — SUCCINYLCHOLINE CHLORIDE 200 MG/10ML IV SOSY
PREFILLED_SYRINGE | INTRAVENOUS | Status: DC | PRN
  Administered 2021-07-22: 120 mg via INTRAVENOUS

## 2021-07-22 MED ORDER — FLEET ENEMA 7-19 GM/118ML RE ENEM: 1.0000 | ENEMA | Freq: Once | RECTAL | Status: DC | PRN

## 2021-07-22 MED ORDER — ONDANSETRON HCL 4 MG/2ML IJ SOLN
4.0000 mg | Freq: Once | INTRAMUSCULAR | Status: AC
  Administered 2021-07-22: 4 mg via INTRAVENOUS
  Filled 2021-07-22: qty 2

## 2021-07-22 MED ORDER — METHOCARBAMOL 1000 MG/10ML IJ SOLN
500.0000 mg | Freq: Four times a day (QID) | INTRAVENOUS | Status: DC | PRN
  Filled 2021-07-22: qty 5

## 2021-07-22 MED ORDER — ORAL CARE MOUTH RINSE: 15.0000 mL | Freq: Once | OROMUCOSAL | Status: AC

## 2021-07-22 MED ORDER — GADOBUTROL 1 MMOL/ML IV SOLN
8.0000 mL | Freq: Once | INTRAVENOUS | Status: AC | PRN
  Administered 2021-07-22: 8 mL via INTRAVENOUS

## 2021-07-22 MED ORDER — ROCURONIUM BROMIDE 10 MG/ML (PF) SYRINGE
PREFILLED_SYRINGE | INTRAVENOUS | Status: AC
  Filled 2021-07-22: qty 30

## 2021-07-22 MED ORDER — PHENYLEPHRINE HCL-NACL 20-0.9 MG/250ML-% IV SOLN
INTRAVENOUS | Status: DC | PRN
  Administered 2021-07-22: 20 ug/min via INTRAVENOUS

## 2021-07-22 MED ORDER — ONDANSETRON HCL 4 MG/2ML IJ SOLN
INTRAMUSCULAR | Status: DC | PRN
  Administered 2021-07-22: 4 mg via INTRAVENOUS

## 2021-07-22 MED ORDER — HYDROMORPHONE HCL 1 MG/ML IJ SOLN
0.5000 mg | INTRAMUSCULAR | Status: DC | PRN
  Administered 2021-07-22 – 2021-07-28 (×12): 1 mg via INTRAVENOUS
  Filled 2021-07-22 (×14): qty 1

## 2021-07-22 MED ORDER — CHLORHEXIDINE GLUCONATE 0.12 % MT SOLN
15.0000 mL | Freq: Once | OROMUCOSAL | Status: AC
  Administered 2021-07-22: 15 mL via OROMUCOSAL
  Filled 2021-07-22: qty 15

## 2021-07-22 MED ORDER — SODIUM CHLORIDE 0.9% FLUSH
3.0000 mL | Freq: Two times a day (BID) | INTRAVENOUS | Status: DC
  Administered 2021-07-22 – 2021-08-01 (×15): 3 mL via INTRAVENOUS

## 2021-07-22 MED ORDER — OXYCODONE HCL 5 MG PO TABS: 5.0000 mg | ORAL_TABLET | Freq: Once | ORAL | Status: DC | PRN

## 2021-07-22 MED ORDER — ACETAMINOPHEN 325 MG PO TABS
650.0000 mg | ORAL_TABLET | Freq: Four times a day (QID) | ORAL | Status: DC | PRN
  Administered 2021-07-27: 650 mg via ORAL
  Filled 2021-07-22: qty 2

## 2021-07-22 MED ORDER — METOPROLOL TARTRATE 50 MG PO TABS
50.0000 mg | ORAL_TABLET | Freq: Two times a day (BID) | ORAL | Status: DC
  Administered 2021-07-22 – 2021-08-01 (×20): 50 mg via ORAL
  Filled 2021-07-22 (×5): qty 1
  Filled 2021-07-22: qty 2
  Filled 2021-07-22 (×4): qty 1
  Filled 2021-07-22: qty 2
  Filled 2021-07-22 (×5): qty 1
  Filled 2021-07-22: qty 2
  Filled 2021-07-22 (×5): qty 1

## 2021-07-22 MED ORDER — ONDANSETRON HCL 4 MG PO TABS: 4.0000 mg | ORAL_TABLET | Freq: Four times a day (QID) | ORAL | Status: DC | PRN

## 2021-07-22 MED ORDER — PROPOFOL 10 MG/ML IV BOLUS
INTRAVENOUS | Status: DC | PRN
  Administered 2021-07-22: 150 mg via INTRAVENOUS

## 2021-07-22 MED ORDER — SUGAMMADEX SODIUM 200 MG/2ML IV SOLN
INTRAVENOUS | Status: DC | PRN
  Administered 2021-07-22: 200 mg via INTRAVENOUS

## 2021-07-22 MED ORDER — POLYETHYLENE GLYCOL 3350 17 G PO PACK
17.0000 g | PACK | Freq: Every day | ORAL | Status: DC | PRN
  Administered 2021-07-24 – 2021-07-31 (×5): 17 g via ORAL
  Filled 2021-07-22 (×5): qty 1

## 2021-07-22 MED ORDER — MUPIROCIN 2 % EX OINT
1.0000 "application " | TOPICAL_OINTMENT | Freq: Two times a day (BID) | CUTANEOUS | Status: AC
  Administered 2021-07-22 – 2021-07-26 (×8): 1 via NASAL
  Filled 2021-07-22 (×3): qty 22

## 2021-07-22 MED ORDER — MIDAZOLAM HCL 2 MG/2ML IJ SOLN
INTRAMUSCULAR | Status: AC
  Filled 2021-07-22: qty 2

## 2021-07-22 MED ORDER — BISACODYL 10 MG RE SUPP: 10.0000 mg | Freq: Every day | RECTAL | Status: DC | PRN

## 2021-07-22 MED ORDER — FENTANYL CITRATE PF 50 MCG/ML IJ SOSY
50.0000 ug | PREFILLED_SYRINGE | Freq: Once | INTRAMUSCULAR | Status: AC
  Administered 2021-07-22: 50 ug via INTRAVENOUS
  Filled 2021-07-22: qty 1

## 2021-07-22 MED ORDER — PHENYLEPHRINE 40 MCG/ML (10ML) SYRINGE FOR IV PUSH (FOR BLOOD PRESSURE SUPPORT)
PREFILLED_SYRINGE | INTRAVENOUS | Status: DC | PRN
  Administered 2021-07-22 (×2): 120 ug via INTRAVENOUS

## 2021-07-22 MED ORDER — HYDROCODONE-ACETAMINOPHEN 5-325 MG PO TABS: 1.0000 | ORAL_TABLET | ORAL | Status: DC | PRN

## 2021-07-22 MED ORDER — SODIUM CHLORIDE 0.9% FLUSH: 3.0000 mL | INTRAVENOUS | Status: DC | PRN

## 2021-07-22 MED ORDER — FENTANYL CITRATE (PF) 250 MCG/5ML IJ SOLN
INTRAMUSCULAR | Status: DC | PRN
  Administered 2021-07-22 (×2): 50 ug via INTRAVENOUS
  Administered 2021-07-22: 150 ug via INTRAVENOUS

## 2021-07-22 MED ORDER — VANCOMYCIN HCL 1500 MG/300ML IV SOLN
1500.0000 mg | INTRAVENOUS | Status: AC
  Administered 2021-07-22: 1500 mg via INTRAVENOUS
  Filled 2021-07-22: qty 300

## 2021-07-22 MED ORDER — PANTOPRAZOLE SODIUM 40 MG PO TBEC
40.0000 mg | DELAYED_RELEASE_TABLET | Freq: Every day | ORAL | Status: DC
  Administered 2021-07-23 – 2021-08-01 (×10): 40 mg via ORAL
  Filled 2021-07-22 (×10): qty 1

## 2021-07-22 MED ORDER — LEVOTHYROXINE SODIUM 75 MCG PO TABS
150.0000 ug | ORAL_TABLET | Freq: Every day | ORAL | Status: DC
  Administered 2021-07-23 – 2021-08-01 (×10): 150 ug via ORAL
  Filled 2021-07-22 (×10): qty 2

## 2021-07-22 MED ORDER — ALUM & MAG HYDROXIDE-SIMETH 200-200-20 MG/5ML PO SUSP: 30.0000 mL | Freq: Four times a day (QID) | ORAL | Status: DC | PRN

## 2021-07-22 MED ORDER — THROMBIN 5000 UNITS EX SOLR
CUTANEOUS | Status: AC
  Filled 2021-07-22: qty 5000

## 2021-07-22 MED ORDER — TORSEMIDE 20 MG PO TABS
20.0000 mg | ORAL_TABLET | Freq: Two times a day (BID) | ORAL | Status: DC
  Administered 2021-07-23 – 2021-08-01 (×19): 20 mg via ORAL
  Filled 2021-07-22 (×22): qty 1

## 2021-07-22 MED ORDER — SODIUM CHLORIDE 0.9% IV SOLUTION: Freq: Once | INTRAVENOUS | Status: DC

## 2021-07-22 MED ORDER — THROMBIN 5000 UNITS EX SOLR
OROMUCOSAL | Status: DC | PRN
  Administered 2021-07-22: 5 mL via TOPICAL

## 2021-07-22 MED ORDER — PHENYLEPHRINE HCL (PRESSORS) 10 MG/ML IV SOLN
INTRAVENOUS | Status: AC
  Filled 2021-07-22: qty 2

## 2021-07-22 MED ORDER — VANCOMYCIN HCL 1250 MG/250ML IV SOLN
1250.0000 mg | Freq: Once | INTRAVENOUS | Status: AC
  Administered 2021-07-22: 1250 mg via INTRAVENOUS
  Filled 2021-07-22: qty 250

## 2021-07-22 MED ORDER — FENTANYL CITRATE (PF) 250 MCG/5ML IJ SOLN
INTRAMUSCULAR | Status: AC
  Filled 2021-07-22: qty 5

## 2021-07-22 MED ORDER — HYDROCODONE-ACETAMINOPHEN 7.5-325 MG PO TABS
2.0000 | ORAL_TABLET | ORAL | Status: DC | PRN
Start: 2021-07-22 — End: 2021-07-27
  Administered 2021-07-22 – 2021-07-27 (×23): 2 via ORAL
  Filled 2021-07-22 (×24): qty 2

## 2021-07-22 SURGICAL SUPPLY — 65 items
ADH SKN CLS APL DERMABOND .7 (GAUZE/BANDAGES/DRESSINGS) ×1
APL SKNCLS STERI-STRIP NONHPOA (GAUZE/BANDAGES/DRESSINGS)
BAG COUNTER SPONGE SURGICOUNT (BAG) ×3 IMPLANT
BAG SPNG CNTER NS LX DISP (BAG) ×2
BAND INSRT 18 STRL LF DISP RB (MISCELLANEOUS) ×2
BAND RUBBER #18 3X1/16 STRL (MISCELLANEOUS) ×4 IMPLANT
BENZOIN TINCTURE PRP APPL 2/3 (GAUZE/BANDAGES/DRESSINGS) IMPLANT
BIT DRILL NEURO 2X3.1 SFT TUCH (MISCELLANEOUS) ×1 IMPLANT
BLADE CLIPPER SURG (BLADE) IMPLANT
BUR CARBIDE MATCH 3.0 (BURR) ×1 IMPLANT
CANISTER SUCT 3000ML PPV (MISCELLANEOUS) ×2 IMPLANT
CARTRIDGE OIL MAESTRO DRILL (MISCELLANEOUS) ×1 IMPLANT
COVER MAYO STAND STRL (DRAPES) ×4 IMPLANT
DERMABOND ADVANCED (GAUZE/BANDAGES/DRESSINGS) ×1
DERMABOND ADVANCED .7 DNX12 (GAUZE/BANDAGES/DRESSINGS) IMPLANT
DIFFUSER DRILL AIR PNEUMATIC (MISCELLANEOUS) ×1 IMPLANT
DRAPE C-ARM 42X72 X-RAY (DRAPES) ×2 IMPLANT
DRAPE HALF SHEET 40X57 (DRAPES) ×1 IMPLANT
DRAPE LAPAROTOMY 100X72X124 (DRAPES) ×2 IMPLANT
DRAPE MICROSCOPE LEICA (MISCELLANEOUS) ×2 IMPLANT
DRILL NEURO 2X3.1 SOFT TOUCH (MISCELLANEOUS) ×2
DURAPREP 6ML APPLICATOR 50/CS (WOUND CARE) ×1 IMPLANT
ELECT COATED BLADE 2.86 ST (ELECTRODE) ×2 IMPLANT
ELECT REM PT RETURN 9FT ADLT (ELECTROSURGICAL) ×2
ELECTRODE REM PT RTRN 9FT ADLT (ELECTROSURGICAL) ×1 IMPLANT
EVACUATOR 1/8 PVC DRAIN (DRAIN) ×1 IMPLANT
GAUZE 4X4 16PLY ~~LOC~~+RFID DBL (SPONGE) ×1 IMPLANT
GAUZE SPONGE 4X4 12PLY STRL LF (GAUZE/BANDAGES/DRESSINGS) ×1 IMPLANT
GLOVE EXAM NITRILE LRG STRL (GLOVE) IMPLANT
GLOVE EXAM NITRILE XL STR (GLOVE) IMPLANT
GLOVE EXAM NITRILE XS STR PU (GLOVE) IMPLANT
GLOVE SRG 8 PF TXTR STRL LF DI (GLOVE) ×1 IMPLANT
GLOVE SURG LTX SZ8 (GLOVE) ×4 IMPLANT
GLOVE SURG UNDER POLY LF SZ8 (GLOVE) ×2
GOWN STRL REUS W/ TWL LRG LVL3 (GOWN DISPOSABLE) IMPLANT
GOWN STRL REUS W/ TWL XL LVL3 (GOWN DISPOSABLE) ×1 IMPLANT
GOWN STRL REUS W/TWL 2XL LVL3 (GOWN DISPOSABLE) IMPLANT
GOWN STRL REUS W/TWL LRG LVL3 (GOWN DISPOSABLE)
GOWN STRL REUS W/TWL XL LVL3 (GOWN DISPOSABLE) ×2
HEMOSTAT POWDER KIT SURGIFOAM (HEMOSTASIS) ×2 IMPLANT
KIT BASIN OR (CUSTOM PROCEDURE TRAY) ×2 IMPLANT
KIT TURNOVER KIT B (KITS) ×2 IMPLANT
NDL SPNL 18GX3.5 QUINCKE PK (NEEDLE) ×1 IMPLANT
NEEDLE HYPO 22GX1.5 SAFETY (NEEDLE) ×2 IMPLANT
NEEDLE SPNL 18GX3.5 QUINCKE PK (NEEDLE) ×2 IMPLANT
NS IRRIG 1000ML POUR BTL (IV SOLUTION) ×2 IMPLANT
OIL CARTRIDGE MAESTRO DRILL (MISCELLANEOUS)
PACK LAMINECTOMY NEURO (CUSTOM PROCEDURE TRAY) ×2 IMPLANT
PAD ARMBOARD 7.5X6 YLW CONV (MISCELLANEOUS) ×6 IMPLANT
PIN DISTRACTION 14MM (PIN) ×2 IMPLANT
SCREW XTD VAR 4.2 SELF TAP 16 (Screw) ×4 IMPLANT
SPONGE INTESTINAL PEANUT (DISPOSABLE) ×2 IMPLANT
SPONGE SURGIFOAM ABS GEL SZ50 (HEMOSTASIS) ×1 IMPLANT
SPONGE T-LAP 4X18 ~~LOC~~+RFID (SPONGE) ×1 IMPLANT
STAPLER VISISTAT 35W (STAPLE) ×1 IMPLANT
STRIP CLOSURE SKIN 1/2X4 (GAUZE/BANDAGES/DRESSINGS) ×1 IMPLANT
SUT ETHILON 3 0 PS 1 (SUTURE) ×1 IMPLANT
SUT VIC AB 2-0 CT2 18 VCP726D (SUTURE) ×1 IMPLANT
SUT VIC AB 3-0 SH 8-18 (SUTURE) ×2 IMPLANT
TAPE CLOTH SURG 4X10 WHT LF (GAUZE/BANDAGES/DRESSINGS) ×1 IMPLANT
TAPE SURG TRANSPORE 1 IN (GAUZE/BANDAGES/DRESSINGS) ×1 IMPLANT
TAPE SURGICAL TRANSPORE 1 IN (GAUZE/BANDAGES/DRESSINGS) ×2
TOWEL GREEN STERILE (TOWEL DISPOSABLE) ×2 IMPLANT
TOWEL GREEN STERILE FF (TOWEL DISPOSABLE) ×2 IMPLANT
WATER STERILE IRR 1000ML POUR (IV SOLUTION) ×2 IMPLANT

## 2021-07-22 NOTE — Progress Notes (Signed)
   Providing Compassionate, Quality Care - Together  NEUROSURGERY PROGRESS NOTE   S: Patient seen and examined in recovery room.  States his strength and tingling feeling is much improved.  O: EXAM:  BP (!) 154/61 (BP Location: Right Arm)   Pulse 81   Temp 98.6 F (37 C)   Resp (!) 21   Ht 5\' 11"  (1.803 m)   Wt 105.2 kg   SpO2 98%   BMI 32.35 kg/m   Awake, alert, oriented x3 PERRLA Neck soft Trachea midline Hemovac in place Normal phonation Speech fluent, appropriate  CNs grossly intact  RUE deltoid 3/5, grip 4+/5, bicep, tricep 4+/5 Left upper extremity 4+/5 throughout BLE 4+/5 throughout Sensory to light touch intact throughout  ASSESSMENT:  70 y.o. male with   Cervical spondylotic myelopathy, status post ACDF C3-4 on 07/19/2021 Epidural hematoma causing cord compression and weakness ITP  -Status post revision ACDF C3-4, evacuation of epidural hematoma on 07/22/2021  PLAN: -PT/OT eval -Pain control -Hold aspirin x10 days -Monitor Hemovac    Thank you for allowing me to participate in this patient's care.  Please do not hesitate to call with questions or concerns.   Elwin Sleight, [facility] Cell: [phone]

## 2021-07-22 NOTE — ED Notes (Signed)
Attempted to call report to [facility]. Room ready without RN assigned. Floor will call ED back.

## 2021-07-22 NOTE — ED Notes (Signed)
Patient to come to C after MRI

## 2021-07-22 NOTE — Anesthesia Procedure Notes (Signed)
Procedure Name: Intubation Date/Time: 07/22/2021 1:40 PM Performed by: Leonor Liv, CRNA Pre-anesthesia Checklist: Patient identified, Emergency Drugs available, Suction available and Patient being monitored Patient Re-evaluated:Patient Re-evaluated prior to induction Oxygen Delivery Method: Circle System Utilized Preoxygenation: Pre-oxygenation with 100% oxygen Induction Type: IV induction Ventilation: Mask ventilation without difficulty Laryngoscope Size: Glidescope and 4 Grade View: Grade I Tube type: Oral Tube size: 7.5 mm Number of attempts: 1 Airway Equipment and Method: Stylet and Oral airway Placement Confirmation: ETT inserted through vocal cords under direct vision, positive ETCO2 and breath sounds checked- equal and bilateral Secured at: 22 cm Tube secured with: Tape Dental Injury: Teeth and Oropharynx as per pre-operative assessment  Comments: Post-op epidural hematoma/neck swelling, elected glidescope for intubation. Grade I view, redness/irritation noted around vocal cords prior to intubation.

## 2021-07-22 NOTE — ED Provider Notes (Addendum)
Emergency Medicine Provider Triage Evaluation Note  [name] , a 70 y.o. male  was evaluated in triage.  Pt complains of bilateral lower extremity weakness.  Had recent cervical fusion on Monday by [name].  States symptoms started tonight after showering, now states that he can't stand or walk.  Denies any fevers.  Review of Systems  Positive: Weakness in legs, pain Negative: Fever, chills  Physical Exam  BP (!) 160/75 (BP Location: Right Arm)   Pulse (!) 115   Temp 97.7 F (36.5 C) (Oral)   Resp 19   SpO2 100%  Gen:   Awake, no distress   Resp:  Normal effort  MSK:   4/5 strength in lower extremities while seated  Other:    Medical Decision Making  Medically screening exam initiated at 2:50 AM.  Appropriate orders placed.  DWAIN HUHN was informed that the remainder of the evaluation will be completed by another provider, this initial triage assessment does not replace that evaluation, and the importance of remaining in the ED until their evaluation is complete.  Leg weakness  Doesn't appear toxic, but should be expedited to room as soon as possible.     Montine Circle, PA-C 07/22/21 0252    Montine Circle, PA-C 07/22/21 0253    Orpah Greek, MD 07/23/21 [phone]

## 2021-07-22 NOTE — Op Note (Signed)
Date of service: 07/22/2021  PREOP DIAGNOSIS: Cervical stenosis with right greater than left weakness due to C3-4 epidural hematoma; cervical myelopathy  POSTOP DIAGNOSIS: Same  PROCEDURE: 1.  Revision arthrodesis C3-4, anterior interbody technique 2.  Evacuation of epidural hematoma 2. Placement of intervertebral biomechanical device C3-4, globus interbody 3. Placement of anterior instrumentation consisting of interbody plate and screws -globus plate, 44mm screws x4  4. Use of intraoperative microscope 5. Use of intraoperative fluoroscopy  SURGEON: Dr. Pieter Partridge Lynford Espinoza, DO  ASSISTANT: Alena Bills, MS 3  ANESTHESIA: General Endotracheal  EBL: 50 cc   SPECIMENS: None  DRAINS: Medium Hemovac, prevertebral space  COMPLICATIONS: None immediate  CONDITION: Hemodynamically stable to PACU  HISTORY: [name] is a 70 y.o. y.o. male who has a history of ITP and initially underwent a C3-4 ACDF by [name] on 2/69/4854 that was uncomplicated.  He was discharged home on postoperative day 1.  He was doing well until last night.  He presented early this morning after noting numbness tingling, lightening sensation and weakness going down his arms and legs during a shower.  Work-up revealed an epidural hematoma at C3-4 with cord compression.  His physical exam showed significant right-sided weakness in the arm primarily in the deltoid and grip.  I discussed with him and his wife and his daughter at bedside about the findings on MRI and CT.  I recommended urgent surgical decompression in the form of revision of hardware, evacuation of hematoma and replacement of hardware.  All risks, benefits and expected outcomes were discussed and agreed upon with the patient.  PROCEDURE IN DETAIL: The patient was brought to the operating room and transferred to the operative table. After induction of general anesthesia, the patient was positioned on the operative table in the supine position with all  pressure points meticulously padded. The skin of the neck was then prepped and draped in the usual sterile fashion.  After timeout was conducted, skin incision was made sharply through previous incision.  The platysma was identified and the previous opening was reopened with Metzenbaum scissors. The sternocleidomastoid muscle was then identified and, utilizing natural fascial planes in the neck, the prevertebral fascia was identified and the carotid sheath was retracted laterally and the trachea and esophagus retracted medially, the previous tract was still identifiable.  There was a small amount of prevertebral hematoma identified and evacuated with suction.  The previous cervical plate was identified.  Using Bovie electrocautery the longus coli muscles were elevated subperiosteally bilaterally.  Self-retaining retractors were then placed under the longus coli muscles bilaterally. At this point, the microscope was draped and brought into the field, and the remainder of the case was done under the microscope using microdissecting technique.  The plate and screws were removed.  The plate was kept for use.  The interbody device was removed and the previous graft was remained in the device.  There was organized hematoma behind the interbody device that was evacuated with a series of curettes down to the thecal sac.  The 6 thecal sac was identified and decompressed bilaterally.  Using micro curettes and 2 Kerrison rongeurs, further posterior osteophytes were removed and PLL.  The thecal sac was at this point pulsatile.  Using blunt nerve hook, I can dissect out freely into the bilateral neuroforamen.  The thecal sac appeared decompressed bilaterally and I could freely pass a nerve hook behind the vertebral body of C3 and C4.  Hemostasis was meticulously achieved with Surgiflo.  The epidural space was  monitored for series of minutes, copiously irrigated and noted to be excellently hemostatic.  The intervertebral  device was then placed under live lateral fluoroscopy.  After placement of the intervertebral device, the previous anterior cervical plate was selected, and placed across the interspace. Using the previous screw tracts, 66mm by 4.2 millimeter screws were placed bilaterally at C3 and C4.  Final fluoroscopic images in AP and lateral projections were taken to confirm good hardware placement.  At this point, after all counts were verified to be correct, meticulous hemostasis was secured using a combination of bipolar electrocautery and passive hemostatics.  A medium Hemovac was tunneled inferior laterally and placed in the prevertebral space.  This was secured using a 3-0 nylon suture.  The platysma muscle was then closed using interrupted 3-0 Vicryl sutures, and the skin was closed with an interrupted 3-0 Vicry subcuticular stitch. Dermabond and sterile dressings were then applied and the drapes removed.  The patient tolerated the procedure well and was extubated in the room and taken to the postanesthesia care unit in stable condition.

## 2021-07-22 NOTE — ED Provider Notes (Signed)
[facility] EMERGENCY DEPARTMENT Provider Note   CSN: [id number] Arrival date & time: 07/22/21  6712     History Chief Complaint  Patient presents with   Extremity Weakness    [name] is a 70 y.o. male. with PMH significant for DDD, chronic back pain, HTN, coronary artery disease status post CABG x3 who presents to the emergency department with extremity weakness.  States that he had cervical fusion on Monday, 07/19/2021, by [name].  States that last night he was taking a shower when he started having "shooting pain" throughout his body.  States shortly after showering he began having difficulty walking which progressed to inability to stand or walk.  He continues to void spontaneously.  Denies bowel movement since Monday.  However states that he is passing flatus.  Denies shortness of breath or difficulty breathing.  Denies falling, hitting his neck or head, fevers.  Not anticoagulated.  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  Patient underwent anterior cervical discectomy and fusion at C3-C4.  Per notes he was ambulating postoperatively and voiding spontaneously.  Extremity Weakness Pertinent negatives include no shortness of breath.  Past Medical History:  Diagnosis Date   Allergy    Carpal tunnel syndrome    Chronic back pain    Chronic combined systolic and diastolic congestive heart failure (HCC)    Chronic ITP (idiopathic thrombocytopenia) (HCC) 05/25/2015   Coronary artery disease    Coronary artery disease involving native coronary artery of native heart with angina pectoris (HCC)    Degenerative disc disease    GERD (gastroesophageal reflux disease)    History of thrombocytopenia    Hypercholesteremia    Hypertension    Hypothyroid    Lumbar pain    Myocardial infarction ([place]) 2009   Obesity    Pneumonia    around age 40   S/P CABG x 3 04/27/2018   LIMA to LAD SVG to  OM1 SVG to OM2   Shortness of breath dyspnea     Patient Active Problem List   Diagnosis Date Noted   Myelopathy ([place]) 07/19/2021   Callus 05/18/2021   Acute bacterial rhinosinusitis 10/05/2020   Cough in adult 10/05/2020   S/P CABG x 3 04/27/2018   Angina pectoris ([place]) 04/24/2018   Obesity    Chronic combined systolic and diastolic congestive heart failure ([place])    Peripheral edema 06/30/2017   Pedal edema 11/28/2016   Osteopenia 08/13/2015   Thrombocytopenia ([place]) 05/25/2015   Chronic ITP (idiopathic thrombocytopenia) ([place]) 05/25/2015   Hypothyroidism 08/29/2014   Diverticulitis of colon (without mention of hemorrhage)(562.11) 02/04/2014   Dysphagia, unspecified(787.20) 02/04/2014   Hyperlipidemia 11/18/2013   Dyspnea 01/02/2012   Palpitations 01/02/2012   Mixed hyperlipidemia 07/06/2011   Coronary artery disease involving native coronary artery of native heart with angina pectoris ([place])    TOTAL KNEE FOLLOW-UP 08/14/2008   Chronic back pain 07/23/2008   KNEE, ARTHRITIS, DEGEN./OSTEO 05/29/2008   JOINT EFFUSION, KNEE 04/30/2008   Pain in joint, lower leg 10/10/2007   Essential hypertension 10/09/2007    Past Surgical History:  Procedure Laterality Date   ANTERIOR CERVICAL DECOMP/DISCECTOMY FUSION N/A 07/19/2021   Procedure: CERVICAL THREE-FOUR ANTERIOR CERVICAL DECOMPRESSION/DISCECTOMY FUSION;  Surgeon: [name], MD;  Location: [place];  Service: Neurosurgery;  Laterality: N/A;   BACK SURGERY     5 lumbas disc with cervical and lumbar fusions   BIOPSY N/A 03/06/2014   Procedure: ESOPHAGEAL BIOPSIES;  Surgeon: [name], MD;  Location: [facility];  Service: Endoscopy;  Laterality: N/A;   COLONOSCOPY     COLONOSCOPY WITH PROPOFOL N/A 03/06/2014   Procedure: COLONOSCOPY WITH PROPOFOL;  Surgeon: [name], MD;  Location: [facility];  Service: Endoscopy;  Laterality: N/A;  in cecum at 0807; total withdrawal time 9 minutes   CORONARY ANGIOPLASTY WITH STENT PLACEMENT      2000, and 2004 has 3 stents   CORONARY ARTERY BYPASS GRAFT N/A 04/27/2018   Procedure: CORONARY ARTERY BYPASS GRAFTING (CABG) x Three , using left internal mammary artery and right leg greater saphenous vein;  Surgeon: [name], MD;  Location: [place];  Service: Open Heart Surgery;  Laterality: N/A;   ESOPHAGOGASTRODUODENOSCOPY (EGD) WITH PROPOFOL N/A 03/06/2014   Procedure: ESOPHAGOGASTRODUODENOSCOPY (EGD) WITH PROPOFOL;  Surgeon: [name], MD;  Location: [facility];  Service: Endoscopy;  Laterality: N/A;   LEFT HEART CATH AND CORONARY ANGIOGRAPHY N/A 04/24/2018   Procedure: LEFT HEART CATH AND CORONARY ANGIOGRAPHY;  Surgeon: [name], MD;  Location: [facility];  Service: Cardiovascular;  Laterality: N/A;   LUMBAR FUSION  2009   MALONEY DILATION N/A 03/06/2014   Procedure: MALONEY DILATION 54 french;  Surgeon: [name], MD;  Location: [facility];  Service: Endoscopy;  Laterality: N/A;   NECK SURGERY     TEE WITHOUT CARDIOVERSION N/A 04/27/2018   Procedure: TRANSESOPHAGEAL ECHOCARDIOGRAM (TEE);  Surgeon: [name], MD;  Location: [facility];  Service: Open Heart Surgery;  Laterality: N/A;    Family History  Problem Relation Age of Onset   Heart attack Father    Heart disease Father    Colon cancer Maternal Grandmother    Esophageal cancer Neg Hx    Rectal cancer Neg Hx    Stomach cancer Neg Hx    Social History   Tobacco Use   Smoking status: Former    Packs/day: 2.00    Years: 20.00    Pack years: 40.00    Types: Cigarettes    Quit date: 07/06/1991    Years since quitting: 30.0   Smokeless tobacco: Never  Vaping Use   Vaping Use: Never used  Substance Use Topics   Alcohol use: Yes    Comment: occassional   Drug use: No    Home Medications Prior to Admission medications   Medication Sig Start Date End Date Taking? Authorizing Provider  albuterol (PROVENTIL HFA;VENTOLIN HFA) 108 (90 Base) MCG/ACT inhaler Inhale 2 puffs into the lungs every 6 (six)  hours as needed for wheezing or shortness of breath. 10/29/18   Cheyenne Adas, NP  alendronate (FOSAMAX) 70 MG tablet Take 1 tablet (70 mg total) by mouth every 7 (seven) days. Take with a full glass of water on an empty stomach. 05/25/21   Kathyrn Drown, MD  aspirin EC 81 MG EC tablet Take 1 tablet (81 mg total) by mouth daily. 05/03/18   Gold, Wayne E, PA-C  citalopram (CELEXA) 20 MG tablet Take 1 tablet (20 mg total) by mouth every morning. 04/30/21   Kathyrn Drown, MD  cyclobenzaprine (FLEXERIL) 10 MG tablet Take 1 tablet (10 mg total) by mouth 3 (three) times daily as needed for muscle spasms. 07/20/21   [name], MD  ibuprofen (ADVIL) 200 MG tablet Take 800 mg by mouth at bedtime.    [provider]  levothyroxine (SYNTHROID) 150 MCG tablet TAKE 1 TABLET BY MOUTH EVERY DAY 04/30/21   [name], Elayne Snare,  MD  lisinopril (ZESTRIL) 10 MG tablet Take 1 tablet (10 mg total) by mouth daily. 04/30/21   Kathyrn Drown, MD  metoprolol tartrate (LOPRESSOR) 50 MG tablet Take 1 tablet (50 mg total) by mouth 2 (two) times daily. 04/30/21   Kathyrn Drown, MD  Multiple Vitamin (MULTIVITAMIN) capsule Take 1 capsule by mouth daily.      [provider]  nitroGLYCERIN (NITROSTAT) 0.4 MG SL tablet PLACE 1 TABLET UNDER THE TONGUE EVERY 5 MINUTES AS NEEDED FOR CHEST PAINS. 05/18/20   Josue Hector, MD  oxyCODONE 10 MG TABS Take 1 tablet (10 mg total) by mouth every 3 (three) hours as needed for severe pain ((score 7 to 10)). 07/20/21   [name], MD  oxyCODONE-acetaminophen (PERCOCET/ROXICET) 5-325 MG tablet Take 1 tablet by mouth every 4 (four) hours as needed for moderate pain. Patient not taking: Reported on 07/06/2021 05/19/21   Nilda Simmer, NP  pantoprazole (PROTONIX) 40 MG tablet TAKE 1 TABLET(40 MG) BY MOUTH TWICE DAILY 07/12/21   Kathyrn Drown, MD  potassium chloride (KLOR-CON) 10 MEQ tablet TAKE 1 TABLET(10 MEQ) BY MOUTH DAILY 03/16/21   Kathyrn Drown, MD  rosuvastatin (CRESTOR) 40 MG  tablet TAKE 1 TABLET(40 MG) BY MOUTH EVERY EVENING 07/06/21   Kathyrn Drown, MD  tamsulosin (FLOMAX) 0.4 MG CAPS capsule TAKE 2 CAPSULES BY MOUTH AT NIGHT 04/06/21   Kathyrn Drown, MD  torsemide (DEMADEX) 20 MG tablet TAKE 2 TABLETS(40 MG) BY MOUTH DAILY Patient taking differently: Take 20 mg by mouth 2 (two) times daily. 06/14/21   Kathyrn Drown, MD    Allergies    Penicillins, Sulfa antibiotics, and Zoloft [sertraline hcl]  Review of Systems   Review of Systems  Constitutional:  Negative for fever.  Respiratory:  Negative for shortness of breath.   Cardiovascular:  Negative for leg swelling.  Genitourinary:  Negative for difficulty urinating.  Musculoskeletal:  Positive for extremity weakness, gait problem and neck pain.  Neurological:  Positive for weakness and numbness. Negative for facial asymmetry and speech difficulty.  All other systems reviewed and are negative.  Physical Exam Updated Vital Signs BP (!) 143/94 (BP Location: Right Arm)   Pulse 69   Temp 97.7 F (36.5 C) (Oral)   Resp 16   SpO2 100%   Physical Exam Vitals and nursing note reviewed.  Constitutional:      Appearance: Normal appearance. He is not toxic-appearing.  HENT:     Head: Normocephalic and atraumatic.  Eyes:     General: No scleral icterus.    Extraocular Movements: Extraocular movements intact.     Pupils: Pupils are equal, round, and reactive to light.  Cardiovascular:     Rate and Rhythm: Normal rate and regular rhythm.     Pulses: Normal pulses.     Heart sounds: Normal heart sounds.  Pulmonary:     Effort: Pulmonary effort is normal. No respiratory distress.     Breath sounds: Normal breath sounds.  Abdominal:     General: Bowel sounds are normal.     Palpations: Abdomen is soft.  Musculoskeletal:     Cervical back: Tenderness present.  Skin:    General: Skin is warm and dry.     Capillary Refill: Capillary refill takes less than 2 seconds.  Neurological:     Mental Status: He  is alert and oriented to person, place, and time.     GCS: GCS eye subscore is 4. GCS verbal subscore  is 5. GCS motor subscore is 6.     Cranial Nerves: Cranial nerves are intact.     Sensory: No sensory deficit.     Motor: Weakness present. No seizure activity.     Comments: Right greater than left weakness of upper and lower extremities.  He is able to move against gravity bilateral lower extremities left greater than right. Endorses increasing numbness on the right from the chest down.  R arm: 2/5 L arm: 4/5 R leg: 3/5 L leg: 4/5  Psychiatric:        Mood and Affect: Mood normal.        Behavior: Behavior normal.    ED Results / Procedures / Treatments   Labs (all labs ordered are listed, but only abnormal results are displayed) Labs Reviewed  CBC WITH DIFFERENTIAL/PLATELET - Abnormal; Notable for the following components:      Result Value   RBC 4.08 (*)    Hemoglobin 12.3 (*)    Platelets 108 (*)    All other components within normal limits  COMPREHENSIVE METABOLIC PANEL - Abnormal; Notable for the following components:   Glucose, Bld 109 (*)    Creatinine, Ser 1.45 (*)    AST 13 (*)    GFR, Estimated 52 (*)    All other components within normal limits  CBG MONITORING, ED - Abnormal; Notable for the following components:   Glucose-Capillary 113 (*)    All other components within normal limits  I-STAT CHEM 8, ED - Abnormal; Notable for the following components:   Creatinine, Ser 1.40 (*)    Glucose, Bld 102 (*)    Calcium, Ion 1.10 (*)    Hemoglobin 12.9 (*)    HCT 38.0 (*)    All other components within normal limits  RESP PANEL BY RT-PCR (FLU A&B, COVID) ARPGX2  CULTURE, BLOOD (ROUTINE X 2)  CULTURE, BLOOD (ROUTINE X 2)  LACTIC ACID, PLASMA  LACTIC ACID, PLASMA    EKG None  Radiology CT HEAD WO CONTRAST (5MM)  Result Date: 07/22/2021 CLINICAL DATA:  70 year old male with postoperative day 3 status post cervical spine ACDF. New onset weakness, unable to  stand. Spasm. EXAM: CT HEAD WITHOUT CONTRAST TECHNIQUE: Contiguous axial images were obtained from the base of the skull through the vertex without intravenous contrast. COMPARISON:  Orbit CT 04/29/2009. FINDINGS: Brain: Only mild generalized cerebral volume loss since 2010. No midline shift, ventriculomegaly, mass effect, evidence of mass lesion, intracranial hemorrhage or evidence of cortically based acute infarction. Gray-white matter differentiation is within normal limits throughout the brain. Stable mild chronic dural calcification. No encephalomalacia identified. Vascular: Calcified atherosclerosis at the skull base. No suspicious intracranial vascular hyperdensity. Skull: No acute osseous abnormality identified. Sinuses/Orbits: Chronic left maxillary alveolar recess mucous retention cysts. Other Visualized paranasal sinuses and mastoids are clear. Other: Visualized orbits and scalp soft tissues are within normal limits. IMPRESSION: Negative for age non contrast CT appearance of the brain. Electronically Signed   By: Genevie Ann M.D.   On: 07/22/2021 04:55   CT Cervical Spine Wo Contrast  Result Date: 07/22/2021 CLINICAL DATA:  70 year old male with postoperative day 3 status post extension of chronic cervical spine fusion. New onset weakness, unable to stand. Spasm. EXAM: CT CERVICAL SPINE WITHOUT CONTRAST TECHNIQUE: Multidetector CT imaging of the cervical spine was performed without intravenous contrast. Multiplanar CT image reconstructions were also generated. COMPARISON:  Intraoperative images 07/19/21. Cervical spine MRI 06/07/2021. FINDINGS: Alignment: Stable alignment since the August MRI. Overall mild straightening of  cervical lordosis. Cervicothoracic junction alignment is within normal limits. Bilateral posterior element alignment is within normal limits. Skull base and vertebrae: Postoperative details are below. Visualized skull base is intact. No atlanto-occipital dissociation. No cervical  vertebral fracture identified. Soft tissues and spinal canal: Suggestion of increased density in the ventral spinal canal at C3-C4 opposite the new ACDF hardware (sagittal image 49 and series 6, image 47) which might be hematoma and may be causing persistent or increased spinal stenosis and cord mass effect at that level. Above and below that level no convincing spinal canal hematoma. Postoperative prevertebral soft tissue swelling, stranding, and small volume soft tissue gas is maximal at C3-C4. Mild gas and stranding along the right anterior neck surgical approach between the right carotid space and thyroid. Disc levels: Advanced anterior C1-C2 degeneration with joint space loss and osteophytosis. C2-C3: Mild to moderate facet hypertrophy greater on the left. Mild endplate spurring. Stable left C3 foraminal stenosis since August. C3-C4: Interval ACDF. Hardware appears intact. Interbody spacer in place. Superimposed endplate spurring and moderate to severe right side facet hypertrophy at this level. Increased ventral spinal canal density on series 6, image 47 as detailed above. Bilateral C4 foraminal stenosis appears improved since August. C4-C5: Stable disc height. Chronic endplate spurring and moderate facet hypertrophy greater on the right. No convincing spinal stenosis at this level. C5 foraminal stenosis is stable. C5-C6: Prior ACDF. No hardware loosening, but appearance of chronic pseudoarthrosis at this level. C6-C7:  Prior ACDF with solid arthrodesis. C7-T1: Chronic disc space loss with circumferential endplate spurring and mild facet hypertrophy. No definite spinal stenosis at this level. Stable C8 foraminal stenosis since August. Upper chest: Chronic upper thoracic disc and endplate degeneration. Negative lung apices and visible superior mediastinum. Right lung azygous fissure, normal variant. Other: Cervicomedullary junction and visible posterior fossa appear negative. IMPRESSION: 1. Recent C3-C4 ACDF  with intact hardware and expected prevertebral soft tissue changes. But questionable increased density in the ventral spinal canal, possibly hematoma which may be causing persistent or increased spinal stenosis and spinal cord mass effect. MRI would best evaluate further. 2. Other cervical spine levels appear stable from an August MRI. Chronic C5-C6 ACDF with pseudoarthrosis. Chronic C6-C7 ACDF with solid arthrodesis. Electronically Signed   By: Genevie Ann M.D.   On: 07/22/2021 05:06   MR Cervical Spine W or Wo Contrast  Result Date: 07/22/2021 CLINICAL DATA:  70 year old male with postoperative day 3 status post extension of chronic cervical spine fusion. New onset weakness, unable to stand. Spasm. EXAM: MRI CERVICAL SPINE WITHOUT AND WITH CONTRAST TECHNIQUE: Multiplanar and multiecho pulse sequences of the cervical spine, to include the craniocervical junction and cervicothoracic junction, were obtained without and with intravenous contrast. CONTRAST:  71mL GADAVIST GADOBUTROL 1 MMOL/ML IV SOLN COMPARISON:  Cervical spine CT 0410 hours today. Cervical spine MRI 06/07/2021. FINDINGS: Alignment: Stable since August. Vertebrae: Mild new hardware susceptibility artifact at C3-C4. Chronic ACDF hardware C5 through C7. No cervical marrow edema or evidence of acute osseous abnormality, but there is new T2 heterogeneous fluid in the posterior interbody space at C3-C4 (series 7, image 8) and also situated around the chronically degenerated right facet at that level, versus fluid mostly in that facet joint on the August MRI. There is faint degenerative endplate marrow edema in the upper thoracic spine at T1-T2. Cord: Spinal stenosis and cord mass effect do appear increased at C3-C4. See further details below. No definite cord signal abnormality is identified there (series 5, image 8 and series  8, image 22). No abnormal intradural enhancement. But there does appear to be mild dural thickening and enhancement at the C3-C4  level. Posterior Fossa, vertebral arteries, paraspinal tissues: Cervicomedullary junction is within normal limits. Stable major vascular flow voids in the neck with dominant right and diminutive left vertebral arteries. Expected prevertebral postoperative changes appear stable from the earlier CT. Disc levels: C2-C3: Stable endplate and facet degeneration since August. Ligament flavum hypertrophy does appear mildly increased. No significant spinal stenosis at this level. Stable left C3 foraminal stenosis. C3-C4: Interval ACDF. Ventral fluid collection contiguous with the interbody space (series 7, image 8) is superimposed on circumferential soft tissue thickening in the spinal canal at that level which may be a combination of dural thickening and residual ligament flavum hypertrophy. Multifactorial spinal stenosis and spinal cord mass effect have increased as seen on series 8, images 22 and 23 (versus series 8, image 9 in August). Fluid about the right facet joint is subtle on axial images. C4-C5: Stable chronic endplate spurring and right greater than left facet hypertrophy. No significant spinal stenosis. Stable moderate to severe C5 foraminal stenosis. Stable postoperative appearance C5-C6 through C7-T1. IMPRESSION: 1. Postoperative increased spinal stenosis and spinal cord mass effect at C3-C4 when compared to the August MRI. This appears multifactorial, related to ventral spinal canal fluid contiguous with the interbody space (perhaps hematoma or seroma) and also some circumferential soft tissue thickening (perhaps acute dural thickening with associated enhancement). 2. No associated cervical spinal cord signal abnormality is identified. 3. Other cervical levels appear stable since the August MRI. 4. Expected prevertebral postoperative changes. Electronically Signed   By: Genevie Ann M.D.   On: 07/22/2021 06:59    Procedures Procedures   Medications Ordered in ED Medications  morphine 4 MG/ML injection 4 mg  (has no administration in time range)  sodium chloride flush (NS) 0.9 % injection 3 mL (has no administration in time range)  acetaminophen (TYLENOL) tablet 650 mg (has no administration in time range)    Or  acetaminophen (TYLENOL) suppository 650 mg (has no administration in time range)  HYDROcodone-acetaminophen (NORCO/VICODIN) 5-325 MG per tablet 1-2 tablet (has no administration in time range)  HYDROmorphone (DILAUDID) injection 0.5-1 mg (has no administration in time range)  methocarbamol (ROBAXIN) 500 mg in dextrose 5 % 50 mL IVPB (has no administration in time range)  docusate sodium (COLACE) capsule 100 mg (has no administration in time range)  polyethylene glycol (MIRALAX / GLYCOLAX) packet 17 g (has no administration in time range)  bisacodyl (DULCOLAX) suppository 10 mg (has no administration in time range)  sodium phosphate (FLEET) 7-19 GM/118ML enema 1 enema (has no administration in time range)  ondansetron (ZOFRAN) tablet 4 mg (has no administration in time range)    Or  ondansetron (ZOFRAN) injection 4 mg (has no administration in time range)  fentaNYL (SUBLIMAZE) injection 50 mcg (50 mcg Intravenous Given 07/22/21 0524)  ondansetron (ZOFRAN) injection 4 mg (4 mg Intravenous Given 07/22/21 0524)  gadobutrol (GADAVIST) 1 MMOL/ML injection 8 mL (8 mLs Intravenous Contrast Given 07/22/21 6073)  fentaNYL (SUBLIMAZE) injection 50 mcg (50 mcg Intravenous Given 07/22/21 7106)   ED Course  I have reviewed the triage vital signs and the nursing notes.  Pertinent labs & imaging results that were available during my care of the patient were reviewed by me and considered in my medical decision making (see chart for details).  0715: consult to neurosurgery  MDM Rules/Calculators/A&P 70 year old male presents emergency department for increasing lower extremity  weakness after cervical spinal fusion on Monday, 07/19/2021.  Initial CT with concern for possible hematoma which may be causing  persistent or increased spinal stenosis and spinal cord mass-effect. MRI obtained which shows postoperative increase spinal stenosis and spinal cord mass-effect at C3-C4 when compared to previous. Patient is otherwise stable, without respiratory distress.  Continues to void spontaneously.  Will need admission to the neurosurgery.  Spoke with Fenton Malling, DNP with neurosurgery for admission at [phone] who states he will come see patient.  He does not want any medications or interventions administered at this time.  61: Spoke with neurosurgery Glenford Peers, NP and [name] at bedside who states they are going to take the patient to the OR.  States they will place admission orders to neurosurgery. Final Clinical Impression(s) / ED Diagnoses Final diagnoses:  Weakness    Rx / DC Orders ED Discharge Orders     None        Mickie Hillier, PA-C 07/22/21 Dumas, [place], DO 07/22/21 [phone]

## 2021-07-22 NOTE — ED Notes (Signed)
Pt c/o increased pain and spasm

## 2021-07-22 NOTE — ED Triage Notes (Signed)
PT states cervical fusion on Monday.   Was released yesterday.  After taking a hot shower tonight he began to shake all over, as if having spasms.  Now c/o bil weakness and states that he cannot stand now.  States if he is in a sitting position he is unable to move to standing position.  No deficits in his arms.  Pain 7/10 to neck.

## 2021-07-22 NOTE — Progress Notes (Signed)
Orthopedic Tech Progress Note Patient Details:  [name] [birth year] [id number]  RN stated " patient has COLLAR"   Patient ID: [name], male   DOB: 20-Jul-1951, 70 y.o.   MRN: [id number]  [name] 07/22/2021, 10:51 PM

## 2021-07-22 NOTE — Transfer of Care (Signed)
Immediate Anesthesia Transfer of Care Note  Patient: [name]  Procedure(s) Performed: ANTERIOR CERVICAL DECOMPRESSION/DISCECTOMY REVISON FUSION  WITH EVACUATION OF EPIDURAL HEMATOMA  Patient Location: PACU  Anesthesia Type:General  Level of Consciousness: awake and alert   Airway & Oxygen Therapy: Patient Spontanous Breathing and Patient connected to nasal cannula oxygen  Post-op Assessment: Report given to RN, Post -op Vital signs reviewed and stable and Patient moving all extremities X 4, right hand squeeze weaker than left per pre-op assessment report from CRNA  Post vital signs: Reviewed and stable  Last Vitals:  Vitals Value Taken Time  BP 158/69 07/22/21 1555  Temp 36.6 C 07/22/21 1555  Pulse 80 07/22/21 1601  Resp 17 07/22/21 1601  SpO2 94 % 07/22/21 1601  Vitals shown include unvalidated device data.  Last Pain:  Vitals:   07/22/21 1555  TempSrc:   PainSc: 7       Patients Stated Pain Goal: 3 (47/42/59 5638)  Complications: No notable events documented.

## 2021-07-22 NOTE — Anesthesia Postprocedure Evaluation (Signed)
Anesthesia Post Note  Patient: [name]  Procedure(s) Performed: ANTERIOR CERVICAL DECOMPRESSION/DISCECTOMY REVISON FUSION  WITH EVACUATION OF EPIDURAL HEMATOMA     Patient location during evaluation: PACU Anesthesia Type: General Level of consciousness: awake and alert Pain management: pain level controlled Vital Signs Assessment: post-procedure vital signs reviewed and stable Respiratory status: spontaneous breathing, nonlabored ventilation and respiratory function stable Cardiovascular status: stable and blood pressure returned to baseline Anesthetic complications: no   No notable events documented.  Last Vitals:  Vitals:   07/22/21 1645 07/22/21 1714  BP: (!) 167/77 (!) 175/79  Pulse: 87 85  Resp: 16 20  Temp:  36.8 C  SpO2: 94% 100%    Last Pain:  Vitals:   07/22/21 1714  TempSrc: Oral  PainSc:                  [name]

## 2021-07-22 NOTE — ED Notes (Signed)
Helped get a condom cath on patient. Patient is resting with call bell in reach and family at bedside

## 2021-07-22 NOTE — Progress Notes (Signed)
Pt admitted from the ED with condom cath on, no urine in the bag. Pt's abdomen appeared hard and distended. Pt unable to feel when he needs to urinate. Order for Foley cath. Foley was placed and 1800cc of amber colored urine was removed. Holli Humbles, RN

## 2021-07-22 NOTE — Anesthesia Preprocedure Evaluation (Addendum)
Anesthesia Evaluation  Patient identified by MRN, date of birth, ID band Patient awake    Reviewed: Allergy & Precautions, H&P , NPO status , Patient's Chart, lab work & pertinent test results  Airway Mallampati: II   Neck ROM: limited    Dental  (+) Upper Dentures, Lower Dentures   Pulmonary shortness of breath and with exertion, former smoker,    breath sounds clear to auscultation       Cardiovascular hypertension, Pt. on medications and Pt. on home beta blockers + angina + CAD, + Past MI (2009), + CABG (X3, 2019) and +CHF   Rhythm:regular Rate:Normal     Neuro/Psych Recent ACDF. Now with epidural hematoma with mass effect on spinal cord.  Neuromuscular disease negative psych ROS   GI/Hepatic Neg liver ROS, GERD  Medicated,  Endo/Other  Hypothyroidism   Renal/GU negative Renal ROS  negative genitourinary   Musculoskeletal  (+) Arthritis , Osteoarthritis,  Cervical myelopathy   Abdominal   Peds  Hematology  (+) Blood dyscrasia, anemia ,   Anesthesia Other Findings   Reproductive/Obstetrics                            Anesthesia Physical  Anesthesia Plan  ASA: 3 and emergent  Anesthesia Plan: General   Post-op Pain Management:    Induction: Intravenous  PONV Risk Score and Plan: 2 and Ondansetron, Dexamethasone and Treatment may vary due to age or medical condition  Airway Management Planned: Oral ETT and Video Laryngoscope Planned  Additional Equipment: None  Intra-op Plan:   Post-operative Plan: Extubation in OR  Informed Consent: I have reviewed the patients History and Physical, chart, labs and discussed the procedure including the risks, benefits and alternatives for the proposed anesthesia with the patient or authorized representative who has indicated his/her understanding and acceptance.       Plan Discussed with: Anesthesiologist, CRNA and Surgeon  Anesthesia  Plan Comments: (PAT note by Karoline Caldwell, PA-C: Follows with cardiology for hx of CAD (s/p prior stenting of LCx and RCA in 2003, stenting of OM in 2012, CABG on 04/27/2018 with LIMA-LAD, SVG-OM1, and SVG-OM2), post-operative atrial fibrillation (occurring at time of CABG, no known recurrence), HTN, HLD.  Last seen by Dr. Johnsie Cancel on 05/04/2021 and cleared to have back surgery at that time.  Formal clearance per telephone encounter by Kathyrn Drown, NP 06/23/2021, "Chart reviewed as part of pre-operative protocol coverage. Given past medical history and time since last visit, based on ACC/AHA guidelines,[name] be at acceptable risk for the planned procedure without further cardiovascular testing. The patient ws seen by Dr. Johnsie Cancel 05/04/21 for pre-operative clearance. The patient may hold ASA 81mg  3-5 days prior to procedure and resume. I will forward office note with ful clearance to requesting team."  Preop labs reviewed, creatinine mildly elevated 1.48, platelets mildly low at 144, otherwise unremarkable.  EKG 05/04/2021: Sinus bradycardia with first-degree AV block with occasional premature ventricular complexes.  Rate 54.  TEE 04/27/2018 (post CABG):  Septum: No Patent Foramen Ovale present.   Left atrium: Patent foramen ovale not present.   Left atrium: Cavity is dilated and dilated.   Aortic valve: The valve is trileaflet. Mild valve thickening present.  Mild valve calcification present. Mildly decreased leaflet separation. No  stenosis. Mild regurgitation. No AV vegetation.   Mitral valve: No leaflet thickening and calcification present. Mild  regurgitation.   Right ventricle: Normal cavity size and wall thickness. Mild to  moderately reduced systolic function. No thrombus present. No mass  present.   Pulmonic valve: Trace regurgitation.   Right ventricle: Normal cavity size and ejection fraction.  TTE 04/25/2018 (pre-CABG): - Left ventricle: The cavity size was normal.  Systolic function was  mildly reduced. The estimated ejection fraction was in the range  of 45% to 50%. Hypokinesis of the inferolateral, anterior and  anteroseptal, and anterolateral myocardium. Doppler parameters  are consistent with abnormal left ventricular relaxation (grade 1  diastolic dysfunction). Doppler parameters are consistent with  high ventricular filling pressure.  - Aortic valve: Transvalvular velocity was within the normal range.  There was no stenosis. There was mild regurgitation directed  towards the mitral anterior leaflet. Regurgitation pressure  half-time: 887 ms.  - Aorta: Aortic root dimension: 39 mm (ED).  - Ascending aorta: The ascending aorta was mildly dilated.  - Mitral valve: Transvalvular velocity was within the normal range.  There was no evidence for stenosis. There was trivial  regurgitation.  - Left atrium: The atrium was severely dilated.  - Right ventricle: The cavity size was normal. Wall thickness was  normal. Systolic function was normal.  - Tricuspid valve: There was trivial regurgitation.  - Pulmonary arteries: Systolic pressure was within the normal  range.  )       Anesthesia Quick Evaluation

## 2021-07-22 NOTE — H&P (Signed)
Chief Complaint: BLE weakness HPI: [name] is a 70 y.o. male with a PMHx significant for ITP, HTN, CAD s/p CABG x3 who is s/p C3/4 ACDF by [name] on 07/19/2021 and discharged home in stable condition on 07/20/2021. He reports doing well following surgery and had significant improvement in his preoperative symptoms. Last night he was taking a shower when he had an acute onset of "shooting pain" throughout his body. His symptoms continued to progress into BLE weakness and then the inability to ambulate. He denies any new numbness or tingling or bowel or bladder dysfunction.   Past Medical History:  Diagnosis Date   Allergy    Carpal tunnel syndrome    Chronic back pain    Chronic combined systolic and diastolic congestive heart failure (HCC)    Chronic ITP (idiopathic thrombocytopenia) (HCC) 05/25/2015   Coronary artery disease    Coronary artery disease involving native coronary artery of native heart with angina pectoris (HCC)    Degenerative disc disease    GERD (gastroesophageal reflux disease)    History of thrombocytopenia    Hypercholesteremia    Hypertension    Hypothyroid    Lumbar pain    Myocardial infarction ([place]) 2009   Obesity    Pneumonia    around age 59   S/P CABG x 3 04/27/2018   LIMA to LAD SVG to OM1 SVG to OM2   Shortness of breath dyspnea     Past Surgical History:  Procedure Laterality Date   ANTERIOR CERVICAL DECOMP/DISCECTOMY FUSION N/A 07/19/2021   Procedure: CERVICAL THREE-FOUR ANTERIOR CERVICAL DECOMPRESSION/DISCECTOMY FUSION;  Surgeon: [name], MD;  Location: [facility];  Service: Neurosurgery;  Laterality: N/A;   BACK SURGERY     5 lumbas disc with cervical and lumbar fusions   BIOPSY N/A 03/06/2014   Procedure: ESOPHAGEAL BIOPSIES;  Surgeon: [name], MD;  Location: [facility];  Service: Endoscopy;  Laterality: N/A;   COLONOSCOPY     COLONOSCOPY WITH PROPOFOL N/A 03/06/2014   Procedure: COLONOSCOPY WITH PROPOFOL;  Surgeon: [name], MD;   Location: [facility];  Service: Endoscopy;  Laterality: N/A;  in cecum at 0807; total withdrawal time 9 minutes   CORONARY ANGIOPLASTY WITH STENT PLACEMENT     2000, and 2004 has 3 stents   CORONARY ARTERY BYPASS GRAFT N/A 04/27/2018   Procedure: CORONARY ARTERY BYPASS GRAFTING (CABG) x Three , using left internal mammary artery and right leg greater saphenous vein;  Surgeon: [name], MD;  Location: [facility];  Service: Open Heart Surgery;  Laterality: N/A;   ESOPHAGOGASTRODUODENOSCOPY (EGD) WITH PROPOFOL N/A 03/06/2014   Procedure: ESOPHAGOGASTRODUODENOSCOPY (EGD) WITH PROPOFOL;  Surgeon: [name], MD;  Location: [facility];  Service: Endoscopy;  Laterality: N/A;   LEFT HEART CATH AND CORONARY ANGIOGRAPHY N/A 04/24/2018   Procedure: LEFT HEART CATH AND CORONARY ANGIOGRAPHY;  Surgeon: [name], MD;  Location: [facility];  Service: Cardiovascular;  Laterality: N/A;   LUMBAR FUSION  2009   MALONEY DILATION N/A 03/06/2014   Procedure: MALONEY DILATION 54 french;  Surgeon: [name], MD;  Location: [facility];  Service: Endoscopy;  Laterality: N/A;   NECK SURGERY     TEE WITHOUT CARDIOVERSION N/A 04/27/2018   Procedure: TRANSESOPHAGEAL ECHOCARDIOGRAM (TEE);  Surgeon: [name], MD;  Location: [facility];  Service: Open Heart Surgery;  Laterality: N/A;    Family History  Problem Relation Age of Onset   Heart attack Father  Heart disease Father    Colon cancer Maternal Grandmother    Esophageal cancer Neg Hx    Rectal cancer Neg Hx    Stomach cancer Neg Hx    Social History:  reports that he quit smoking about 30 years ago. His smoking use included cigarettes. He has a 40.00 pack-year smoking history. He has never used smokeless tobacco. He reports current alcohol use. He reports that he does not use drugs.  Allergies:  Allergies  Allergen Reactions   Penicillins Other (See Comments)    SYNCOPAL EPISODE  Has patient had a PCN reaction causing immediate rash,  facial/tongue/throat swelling, SOB or LIGHTHEADEDNESS with HYPOTENSION [SYNCOPE] #  #  #  YES  #  #  #  Has patient had a PCN reaction causing severe rash involving mucus membranes or skin necrosis:No Has patient had a PCN reaction that required hospitalization:No Has patient had a PCN reaction occurring within the last 10 years:Yes    Sulfa Antibiotics Swelling    SWELLING REACTION UNSPECIFIED > PER PREVIOUS PMH   Zoloft [Sertraline Hcl] Other (See Comments)    Confusion     (Not in a hospital admission)   Results for orders placed or performed during the hospital encounter of 07/22/21 (from the past 48 hour(s))  Resp Panel by RT-PCR (Flu A&B, Covid) Nasopharyngeal Swab     Status: None   Collection Time: 07/22/21  2:48 AM   Specimen: Nasopharyngeal Swab; Nasopharyngeal(NP) swabs in vial transport medium  Result Value Ref Range   SARS Coronavirus 2 by RT PCR NEGATIVE NEGATIVE    Comment: (NOTE) SARS-CoV-2 target nucleic acids are NOT DETECTED.  The SARS-CoV-2 RNA is generally detectable in upper respiratory specimens during the acute phase of infection. The lowest concentration of SARS-CoV-2 viral copies this assay can detect is 138 copies/mL. A negative result does not preclude SARS-Cov-2 infection and should not be used as the sole basis for treatment or other patient management decisions. A negative result may occur with  improper specimen collection/handling, submission of specimen other than nasopharyngeal swab, presence of viral mutation(s) within the areas targeted by this assay, and inadequate number of viral copies(<138 copies/mL). A negative result must be combined with clinical observations, patient history, and epidemiological information. The expected result is Negative.  Fact Sheet for Patients:  EntrepreneurPulse.com.au  Fact Sheet for Healthcare Providers:  IncredibleEmployment.be  This test is no t yet approved or cleared  by the [place] FDA and  has been authorized for detection and/or diagnosis of SARS-CoV-2 by FDA under an Emergency Use Authorization (EUA). This EUA will remain  in effect (meaning this test can be used) for the duration of the COVID-19 declaration under Section 564(b)(1) of the Act, 21 U.S.C.section 360bbb-3(b)(1), unless the authorization is terminated  or revoked sooner.       Influenza A by PCR NEGATIVE NEGATIVE   Influenza B by PCR NEGATIVE NEGATIVE    Comment: (NOTE) The Xpert Xpress SARS-CoV-2/FLU/RSV plus assay is intended as an aid in the diagnosis of influenza from Nasopharyngeal swab specimens and should not be used as a sole basis for treatment. Nasal washings and aspirates are unacceptable for Xpert Xpress SARS-CoV-2/FLU/RSV testing.  Fact Sheet for Patients: EntrepreneurPulse.com.au  Fact Sheet for Healthcare Providers: IncredibleEmployment.be  This test is not yet approved or cleared by the [place] FDA and has been authorized for detection and/or diagnosis of SARS-CoV-2 by FDA under an Emergency Use Authorization (EUA). This EUA will remain in effect (meaning this test  can be used) for the duration of the COVID-19 declaration under Section 564(b)(1) of the Act, 21 U.S.C. section 360bbb-3(b)(1), unless the authorization is terminated or revoked.  Performed at [facility], [place] [address], [place], [place] [address]   CBC with Differential/Platelet     Status: Abnormal   Collection Time: 07/22/21  3:02 AM  Result Value Ref Range   WBC 6.5 4.0 - 10.5 K/uL   RBC 4.08 (L) 4.22 - 5.81 MIL/uL   Hemoglobin 12.3 (L) 13.0 - 17.0 g/dL   HCT 39.2 39.0 - 52.0 %   MCV 96.1 80.0 - 100.0 fL   MCH 30.1 26.0 - 34.0 pg   MCHC 31.4 30.0 - 36.0 g/dL   RDW 13.2 11.5 - 15.5 %   Platelets 108 (L) 150 - 400 K/uL    Comment: Immature Platelet Fraction may be clinically indicated, consider ordering this additional  test [id number] REPEATED TO VERIFY PLATELET COUNT CONFIRMED BY SMEAR    nRBC 0.0 0.0 - 0.2 %   Neutrophils Relative % 69 %   Neutro Abs 4.6 1.7 - 7.7 K/uL   Lymphocytes Relative 14 %   Lymphs Abs 0.9 0.7 - 4.0 K/uL   Monocytes Relative 10 %   Monocytes Absolute 0.6 0.1 - 1.0 K/uL   Eosinophils Relative 5 %   Eosinophils Absolute 0.3 0.0 - 0.5 K/uL   Basophils Relative 1 %   Basophils Absolute 0.0 0.0 - 0.1 K/uL   Immature Granulocytes 1 %   Abs Immature Granulocytes 0.03 0.00 - 0.07 K/uL    Comment: Performed at [facility], [address] [place], [place] [address]  Comprehensive metabolic panel     Status: Abnormal   Collection Time: 07/22/21  3:02 AM  Result Value Ref Range   Sodium 136 135 - 145 mmol/L   Potassium 4.1 3.5 - 5.1 mmol/L   Chloride 99 98 - 111 mmol/L   CO2 28 22 - 32 mmol/L   Glucose, Bld 109 (H) 70 - 99 mg/dL    Comment: Glucose reference range applies only to samples taken after fasting for at least 8 hours.   BUN 23 8 - 23 mg/dL   Creatinine, Ser 1.45 (H) 0.61 - 1.24 mg/dL   Calcium 9.2 8.9 - 10.3 mg/dL   Total Protein 6.8 6.5 - 8.1 g/dL   Albumin 3.9 3.5 - 5.0 g/dL   AST 13 (L) 15 - 41 U/L   ALT 9 0 - 44 U/L   Alkaline Phosphatase 60 38 - 126 U/L   Total Bilirubin 0.7 0.3 - 1.2 mg/dL   GFR, Estimated 52 (L) >60 mL/min    Comment: (NOTE) Calculated using the CKD-EPI Creatinine Equation (2021)    Anion gap 9 5 - 15    Comment: Performed at [facility] [address] [place], [place] [address]  Lactic acid, plasma     Status: None   Collection Time: 07/22/21  3:02 AM  Result Value Ref Range   Lactic Acid, Venous 0.8 0.5 - 1.9 mmol/L    Comment: Performed at [facility] [address] [place], [place] [address]  CBG monitoring, ED     Status: Abnormal   Collection Time: 07/22/21  3:06 AM  Result Value Ref Range   Glucose-Capillary 113 (H) 70 - 99 mg/dL    Comment: Glucose reference range applies only to samples taken after  fasting for at least 8 hours.   Comment 1 Notify RN  Comment 2 Document in Chart   I-stat chem 8, ED (not at [facility] or [facility])     Status: Abnormal   Collection Time: 07/22/21  3:13 AM  Result Value Ref Range   Sodium 136 135 - 145 mmol/L   Potassium 3.9 3.5 - 5.1 mmol/L   Chloride 99 98 - 111 mmol/L   BUN 23 8 - 23 mg/dL   Creatinine, Ser 1.40 (H) 0.61 - 1.24 mg/dL   Glucose, Bld 102 (H) 70 - 99 mg/dL    Comment: Glucose reference range applies only to samples taken after fasting for at least 8 hours.   Calcium, Ion 1.10 (L) 1.15 - 1.40 mmol/L   TCO2 27 22 - 32 mmol/L   Hemoglobin 12.9 (L) 13.0 - 17.0 g/dL   HCT 38.0 (L) 39.0 - 52.0 %  Protime-INR     Status: None   Collection Time: 07/22/21  8:43 AM  Result Value Ref Range   Prothrombin Time 13.1 11.4 - 15.2 seconds   INR 1.0 0.8 - 1.2    Comment: (NOTE) INR goal varies based on device and disease states. Performed at [facility], [place] [address] [place], [place] [address]   APTT     Status: None   Collection Time: 07/22/21  8:43 AM  Result Value Ref Range   aPTT 30 24 - 36 seconds    Comment: Performed at [facility] [address], [place], [place] [address]   CT HEAD WO CONTRAST (5MM)  Result Date: 07/22/2021 CLINICAL DATA:  70 year old male with postoperative day 3 status post cervical spine ACDF. New onset weakness, unable to stand. Spasm. EXAM: CT HEAD WITHOUT CONTRAST TECHNIQUE: Contiguous axial images were obtained from the base of the skull through the vertex without intravenous contrast. COMPARISON:  Orbit CT 04/29/2009. FINDINGS: Brain: Only mild generalized cerebral volume loss since 2010. No midline shift, ventriculomegaly, mass effect, evidence of mass lesion, intracranial hemorrhage or evidence of cortically based acute infarction. Gray-white matter differentiation is within normal limits throughout the brain. Stable mild chronic dural calcification. No encephalomalacia identified. Vascular: Calcified  atherosclerosis at the skull base. No suspicious intracranial vascular hyperdensity. Skull: No acute osseous abnormality identified. Sinuses/Orbits: Chronic left maxillary alveolar recess mucous retention cysts. Other Visualized paranasal sinuses and mastoids are clear. Other: Visualized orbits and scalp soft tissues are within normal limits. IMPRESSION: Negative for age non contrast CT appearance of the brain. Electronically Signed   By: Genevie Ann M.D.   On: 07/22/2021 04:55   CT Cervical Spine Wo Contrast  Result Date: 07/22/2021 CLINICAL DATA:  70 year old male with postoperative day 3 status post extension of chronic cervical spine fusion. New onset weakness, unable to stand. Spasm. EXAM: CT CERVICAL SPINE WITHOUT CONTRAST TECHNIQUE: Multidetector CT imaging of the cervical spine was performed without intravenous contrast. Multiplanar CT image reconstructions were also generated. COMPARISON:  Intraoperative images 07/19/21. Cervical spine MRI 06/07/2021. FINDINGS: Alignment: Stable alignment since the August MRI. Overall mild straightening of cervical lordosis. Cervicothoracic junction alignment is within normal limits. Bilateral posterior element alignment is within normal limits. Skull base and vertebrae: Postoperative details are below. Visualized skull base is intact. No atlanto-occipital dissociation. No cervical vertebral fracture identified. Soft tissues and spinal canal: Suggestion of increased density in the ventral spinal canal at C3-C4 opposite the new ACDF hardware (sagittal image 49 and series 6, image 47) which might be hematoma and may be causing persistent or increased spinal stenosis and cord mass effect at that  level. Above and below that level no convincing spinal canal hematoma. Postoperative prevertebral soft tissue swelling, stranding, and small volume soft tissue gas is maximal at C3-C4. Mild gas and stranding along the right anterior neck surgical approach between the right carotid space  and thyroid. Disc levels: Advanced anterior C1-C2 degeneration with joint space loss and osteophytosis. C2-C3: Mild to moderate facet hypertrophy greater on the left. Mild endplate spurring. Stable left C3 foraminal stenosis since August. C3-C4: Interval ACDF. Hardware appears intact. Interbody spacer in place. Superimposed endplate spurring and moderate to severe right side facet hypertrophy at this level. Increased ventral spinal canal density on series 6, image 47 as detailed above. Bilateral C4 foraminal stenosis appears improved since August. C4-C5: Stable disc height. Chronic endplate spurring and moderate facet hypertrophy greater on the right. No convincing spinal stenosis at this level. C5 foraminal stenosis is stable. C5-C6: Prior ACDF. No hardware loosening, but appearance of chronic pseudoarthrosis at this level. C6-C7:  Prior ACDF with solid arthrodesis. C7-T1: Chronic disc space loss with circumferential endplate spurring and mild facet hypertrophy. No definite spinal stenosis at this level. Stable C8 foraminal stenosis since August. Upper chest: Chronic upper thoracic disc and endplate degeneration. Negative lung apices and visible superior mediastinum. Right lung azygous fissure, normal variant. Other: Cervicomedullary junction and visible posterior fossa appear negative. IMPRESSION: 1. Recent C3-C4 ACDF with intact hardware and expected prevertebral soft tissue changes. But questionable increased density in the ventral spinal canal, possibly hematoma which may be causing persistent or increased spinal stenosis and spinal cord mass effect. MRI would best evaluate further. 2. Other cervical spine levels appear stable from an August MRI. Chronic C5-C6 ACDF with pseudoarthrosis. Chronic C6-C7 ACDF with solid arthrodesis. Electronically Signed   By: Genevie Ann M.D.   On: 07/22/2021 05:06   MR Cervical Spine W or Wo Contrast  Result Date: 07/22/2021 CLINICAL DATA:  70 year old male with postoperative day  3 status post extension of chronic cervical spine fusion. New onset weakness, unable to stand. Spasm. EXAM: MRI CERVICAL SPINE WITHOUT AND WITH CONTRAST TECHNIQUE: Multiplanar and multiecho pulse sequences of the cervical spine, to include the craniocervical junction and cervicothoracic junction, were obtained without and with intravenous contrast. CONTRAST:  86mL GADAVIST GADOBUTROL 1 MMOL/ML IV SOLN COMPARISON:  Cervical spine CT 0410 hours today. Cervical spine MRI 06/07/2021. FINDINGS: Alignment: Stable since August. Vertebrae: Mild new hardware susceptibility artifact at C3-C4. Chronic ACDF hardware C5 through C7. No cervical marrow edema or evidence of acute osseous abnormality, but there is new T2 heterogeneous fluid in the posterior interbody space at C3-C4 (series 7, image 8) and also situated around the chronically degenerated right facet at that level, versus fluid mostly in that facet joint on the August MRI. There is faint degenerative endplate marrow edema in the upper thoracic spine at T1-T2. Cord: Spinal stenosis and cord mass effect do appear increased at C3-C4. See further details below. No definite cord signal abnormality is identified there (series 5, image 8 and series 8, image 22). No abnormal intradural enhancement. But there does appear to be mild dural thickening and enhancement at the C3-C4 level. Posterior Fossa, vertebral arteries, paraspinal tissues: Cervicomedullary junction is within normal limits. Stable major vascular flow voids in the neck with dominant right and diminutive left vertebral arteries. Expected prevertebral postoperative changes appear stable from the earlier CT. Disc levels: C2-C3: Stable endplate and facet degeneration since August. Ligament flavum hypertrophy does appear mildly increased. No significant spinal stenosis at this level. Stable left  C3 foraminal stenosis. C3-C4: Interval ACDF. Ventral fluid collection contiguous with the interbody space (series 7, image  8) is superimposed on circumferential soft tissue thickening in the spinal canal at that level which may be a combination of dural thickening and residual ligament flavum hypertrophy. Multifactorial spinal stenosis and spinal cord mass effect have increased as seen on series 8, images 22 and 23 (versus series 8, image 9 in August). Fluid about the right facet joint is subtle on axial images. C4-C5: Stable chronic endplate spurring and right greater than left facet hypertrophy. No significant spinal stenosis. Stable moderate to severe C5 foraminal stenosis. Stable postoperative appearance C5-C6 through C7-T1. IMPRESSION: 1. Postoperative increased spinal stenosis and spinal cord mass effect at C3-C4 when compared to the August MRI. This appears multifactorial, related to ventral spinal canal fluid contiguous with the interbody space (perhaps hematoma or seroma) and also some circumferential soft tissue thickening (perhaps acute dural thickening with associated enhancement). 2. No associated cervical spinal cord signal abnormality is identified. 3. Other cervical levels appear stable since the August MRI. 4. Expected prevertebral postoperative changes. Electronically Signed   By: Genevie Ann M.D.   On: 07/22/2021 06:59    ROS: Per HPI  Blood pressure (!) 113/92, pulse 87, temperature 97.8 F (36.6 C), temperature source Oral, resp. rate 18, height 5\' 11"  (1.803 m), weight 105.2 kg, SpO2 93 %.  Physical Exam: Patient is awake, A/O X 4, conversant, and in good spirits. They are in NAD and VSS. Speech is fluent and appropriate. BLE 2/5, LUE 4/5, RUE 3/5. PERLA, EOMI. CNs grossly intact. Dressing is clean dry intact. Incision is well approximated with no drainage, erythema, or edema.Soft cervical collar in place.    Assessment/Plan 70 year old male who is s/p C3/4 ACDF by [name] on 07/19/2021. He was recovering well until last night when he began having neuropathic pain radiating throughout his body and BLE  weakness. MRI C-spine revealed an epidural hematoma at the C3/4 level causing local mass effect on the spinal cord. On examination he has severe BLE weakness and moderate RUE weakness. Since the patient has such significant weakness and compression on his spinal cord from an epidural hematoma, I have recommended the patient be urgently taken to surgery for revision of his C3/4 ACDF and evacuation of his epidural hematoma. Risks and benefits were discussed in detail with the patient and his family and they wish to proceed. The patient reports he has been NPO since before midnight. We will transfuse 4 units of platelets due to his thrombocytopenia.  Marvis Moeller, NP 07/22/2021, 9:15 AM

## 2021-07-22 NOTE — ED Notes (Signed)
Patient transported to MRI 

## 2021-07-23 ENCOUNTER — Encounter (HOSPITAL_COMMUNITY): Payer: Self-pay | Admitting: Neurological Surgery

## 2021-07-23 LAB — PREPARE PLATELET PHERESIS
Unit division: 0
Unit division: 0
Unit division: 0

## 2021-07-23 LAB — BPAM PLATELET PHERESIS
Blood Product Expiration Date: 202209232359
Blood Product Expiration Date: 202209242359
Blood Product Expiration Date: 202209242359
ISSUE DATE / TIME: 202209221317
ISSUE DATE / TIME: 202209221357
ISSUE DATE / TIME: 202209221357
Unit Type and Rh: 6200
Unit Type and Rh: 6200
Unit Type and Rh: 6200

## 2021-07-23 MED ORDER — METHOCARBAMOL 500 MG PO TABS
500.0000 mg | ORAL_TABLET | Freq: Four times a day (QID) | ORAL | Status: DC | PRN
  Administered 2021-07-23 – 2021-08-01 (×18): 500 mg via ORAL
  Filled 2021-07-23 (×18): qty 1

## 2021-07-23 MED ORDER — CHLORHEXIDINE GLUCONATE CLOTH 2 % EX PADS
6.0000 | MEDICATED_PAD | Freq: Every day | CUTANEOUS | Status: DC
  Administered 2021-07-23 – 2021-07-30 (×8): 6 via TOPICAL

## 2021-07-23 NOTE — Progress Notes (Signed)
Patient transfer to 4N12 per order. Patient alert and oriented. Wife at bedside with all belongings when transfer.

## 2021-07-23 NOTE — Evaluation (Signed)
Occupational Therapy Evaluation Patient Details Name: [name] MRN: [id number] DOB: 11/07/1950 Today's Date: 07/23/2021   History of Present Illness Pt is a 70 y/o male who is s/p C3/4 ACDF by [name] on 07/19/2021 and discharged home in stable condition on 07/20/2021. Pt presented to ED on 9/22 due to acute onset of shooting pain, B LE weakness, R UE weakness and inability to ambulate. Pt noted with epidural hematoma and underwent ACDF revision at C3-4 and evacuation of hematoma on 9/22. PMH: carpal tunnel syndrome, CHF, CAD, DDD, GERD, HTN, MI, CABG x 3 (2019), hx of back surgery (2015)   Clinical Impression   Pt presents s/p procedures above and significant deficits in coordination, strength, sensation and balance. Pt reports doing well at home after initial discharge until onset of symptoms above. Pt currently requires Mod A for bed mobility, Mod A for side steps at bedside with RW and hands on assist to correct anterior lean with noted narrow base of support. Pt requires Min A for UB ADL and up to Max A for LB ADL due to deficits. Pt eager to participate and return to independence. Provided theraputty with HEP handout and squeeze ball to further address R dominant hand deficits. Pt may also benefit from built up grips for utensils in next session. Pt is motivated and reports "I will do whatever it takes to get better". Believe pt would progress well with intensive therapies at CIR.      Recommendations for follow up therapy are one component of a multi-disciplinary discharge planning process, led by the attending physician.  Recommendations may be updated based on patient status, additional functional criteria and insurance authorization.   Follow Up Recommendations  CIR    Equipment Recommendations  None recommended by OT    Recommendations for Other Services       Precautions / Restrictions Precautions Precautions: Fall;Cervical Precaution Booklet Issued: Yes (comment) Required  Braces or Orthoses: Cervical Brace Cervical Brace: Soft collar;Other (comment) (remove in bed, for ADLs; can ambulate to/from bathroom without it) Restrictions Weight Bearing Restrictions: No      Mobility Bed Mobility Overal bed mobility: Needs Assistance Bed Mobility: Rolling;Sidelying to Sit;Sit to Supine Rolling: Min guard Sidelying to sit: Min assist   Sit to supine: Mod assist   General bed mobility comments: min guard to safely roll due to poor LE coordination, Min A to sit EOB with cues for cervical precautions. mod A to get B LE back into bed and safely guide trunk    Transfers Overall transfer level: Needs assistance Equipment used: Rolling walker (2 wheeled) Transfers: Sit to/from Stand Sit to Stand: Min assist;From elevated surface         General transfer comment: Unable to stand from low surface, with elevated bed, pt able to power up x 2 with Min A and cues for hand placement. with initial standing, noted with anterior lean requiring Mod A to correct balance and posture. Mod A for side steps at bedside using RW noted with B LE instability and narrow BOS increasing fall risk    Balance Overall balance assessment: Needs assistance Sitting-balance support: Feet supported;No upper extremity supported Sitting balance-Leahy Scale: Fair   Postural control: Other (comment) (anterior lean) Standing balance support: Bilateral upper extremity supported;During functional activity Standing balance-Leahy Scale: Poor Standing balance comment: reliant on UE support and external support  ADL either performed or assessed with clinical judgement   ADL Overall ADL's : Needs assistance/impaired Eating/Feeding: Minimal assistance;Sitting Eating/Feeding Details (indicate cue type and reason): for opening containers, cutting food and managing spillage Grooming: Minimal assistance;Sitting   Upper Body Bathing: Minimal assistance;Sitting    Lower Body Bathing: Moderate assistance;Sitting/lateral leans;Sit to/from stand   Upper Body Dressing : Minimal assistance;Sitting   Lower Body Dressing: Maximal assistance;Sit to/from stand       Toileting- Water quality scientist and Hygiene: Maximal assistance;Sitting/lateral lean;Sit to/from stand         General ADL Comments: Pt with significant deficits in coordination, R UE strength, BLE strength and balance with inability to ambulate or complete ADLs without increased physical assist     Vision Baseline Vision/History: 1 Wears glasses Ability to See in Adequate Light: 0 Adequate Vision Assessment?: No apparent visual deficits Additional Comments: hx of lazy eye as a child per pt     Perception     Praxis      Pertinent Vitals/Pain Pain Assessment: Faces Faces Pain Scale: Hurts little more Pain Location: back of neck Pain Descriptors / Indicators: Operative site guarding;Sore Pain Intervention(s): Monitored during session     Hand Dominance Right   Extremity/Trunk Assessment Upper Extremity Assessment Upper Extremity Assessment: RUE deficits/detail;LUE deficits/detail RUE Deficits / Details: 3-/5 grip strength, 4-/5 biceps/triceps/shoulder. numbness in hand/wrist. poor coordination, unable to hold objects or hold to walker at times. difficulty with opposition; difficulty coordinating UE movements for MMT RUE Sensation: decreased light touch;decreased proprioception RUE Coordination: decreased fine motor;decreased gross motor LUE Deficits / Details: strength WFL, pt reports R deficits > L UE LUE Sensation: decreased light touch LUE Coordination: decreased fine motor   Lower Extremity Assessment Lower Extremity Assessment: Defer to PT evaluation   Cervical / Trunk Assessment Cervical / Trunk Assessment: Other exceptions Cervical / Trunk Exceptions: rounded shoulders; s/p ACDF with soft cervical collar in place   Communication Communication Communication: No  difficulties   Cognition Arousal/Alertness: Awake/alert Behavior During Therapy: WFL for tasks assessed/performed Overall Cognitive Status: Within Functional Limits for tasks assessed                                     General Comments  wife present, asleep in recliner. Provided theraputty with handout, squeeze ball    Exercises     Shoulder Instructions      Home Living Family/patient expects to be discharged to:: Private residence Living Arrangements: Spouse/significant other Available Help at Discharge: Family;Available 24 hours/day Type of Home: House Home Access: Stairs to enter [employer] of Steps: 3 Entrance Stairs-Rails: Right;Left Home Layout: One level     Bathroom Shower/Tub: Occupational psychologist: Handicapped height     Home Equipment: Environmental consultant - 2 wheels;Walker - 4 wheels;Shower seat - built in;Hand held shower head;Grab bars - tub/shower          Prior Functioning/Environment Level of Independence: Independent with assistive device(s)        Comments: reports able to ambulate without AD on a good day, use of AD (more often a walker) on days where his back bothered him more. Reports recent hx of frequent falls. Typically Independent with all ADLs, IADLs. Pt reports no difficulties with ADLs/mobility after initial return home but after shower, began having difficulty with coordination, strength, unable to walk        OT Problem List: Decreased strength;Decreased activity tolerance;Impaired  balance (sitting and/or standing);Decreased coordination;Impaired sensation;Impaired UE functional use      OT Treatment/Interventions: Therapeutic exercise;Self-care/ADL training;DME and/or AE instruction;Therapeutic activities;Patient/family education;Balance training    OT Goals(Current goals can be found in the care plan section) Acute Rehab OT Goals Patient Stated Goal: do whatever it takes to get better and walk again OT  Goal Formulation: With patient Time For Goal Achievement: 08/06/21 Potential to Achieve Goals: Good  OT Frequency: Min 2X/week   Barriers to D/C:            Co-evaluation              AM-PAC OT "6 Clicks" Daily Activity     Outcome Measure Help from another person eating meals?: A Little Help from another person taking care of personal grooming?: A Little Help from another person toileting, which includes using toliet, bedpan, or urinal?: A Lot Help from another person bathing (including washing, rinsing, drying)?: A Lot Help from another person to put on and taking off regular upper body clothing?: A Little Help from another person to put on and taking off regular lower body clothing?: A Lot 6 Click Score: 15   End of Session Equipment Utilized During Treatment: Cervical collar;Rolling walker;Gait belt Nurse Communication: Mobility status  Activity Tolerance: Patient tolerated treatment well Patient left: in bed;with call bell/phone within reach;with family/visitor present  OT Visit Diagnosis: Other abnormalities of gait and mobility (R26.89);Muscle weakness (generalized) (M62.81);Unsteadiness on feet (R26.81)                Time: 5374-8270 OT Time Calculation (min): 19 min Charges:  OT General Charges $OT Visit: 1 Visit OT Evaluation $OT Eval Moderate Complexity: 1 Mod  Malachy Chamber, OTR/L Acute Rehab Services Office: [phone]   Layla Maw 07/23/2021, 7:57 AM

## 2021-07-23 NOTE — Progress Notes (Signed)
Patient currently still admitted 10/21 will close encounter Still currently admitted 10/7  Patient currently admitted 9/30 still admitted   [facility]  Direct Dial: [phone]

## 2021-07-23 NOTE — Evaluation (Signed)
Physical Therapy Evaluation Patient Details Name: [name] MRN: [id number] DOB: Feb 22, 1951 Today's Date: 07/23/2021  History of Present Illness  Pt is a 70 y/o male who is s/p C3/4 ACDF by [name] on 07/19/2021 and discharged home in stable condition on 07/20/2021. Pt presented to ED on 9/22 due to acute onset of shooting pain, B LE weakness, R UE weakness and inability to ambulate. Pt noted with epidural hematoma and underwent ACDF revision at C3-4 and evacuation of hematoma on 9/22. PMH: carpal tunnel syndrome, CHF, CAD, DDD, GERD, HTN, MI, CABG x 3 (2019), hx of back surgery (2015)  Clinical Impression  Patient presents with decreased independence with mobility due to decreased coordination with ataxic movements, decreased sensation with poor proprioceptive awareness for balance and high risk for falls.  Needed heavy mod A for sit to stand due to heavy forward lean and unsafe to attempt ambulation with +1 A.  Patient was using rollator versus RW at home prior to first surgery and felt improved initially after first ACDF prior to development of worsening symptoms and need to return to OR.  Feel he will benefit from skilled PT in the acute setting and from follow up CIR level rehab prior to d/c home with wife assist.        Recommendations for follow up therapy are one component of a multi-disciplinary discharge planning process, led by the attending physician.  Recommendations may be updated based on patient status, additional functional criteria and insurance authorization.  Follow Up Recommendations CIR    Equipment Recommendations  None recommended by PT    Recommendations for Other Services Rehab consult     Precautions / Restrictions Precautions Precautions: Fall;Cervical Precaution Booklet Issued: No Precaution Comments: reviewed precautions Required Braces or Orthoses: Cervical Brace Cervical Brace: Soft collar;Other (comment) Restrictions Weight Bearing Restrictions: No       Mobility  Bed Mobility Overal bed mobility: Needs Assistance Bed Mobility: Rolling;Sidelying to Sit;Sit to Sidelying Rolling: Min assist Sidelying to sit: Min assist   Sit to supine: Mod assist Sit to sidelying: Min assist General bed mobility comments: initially poor technique so cues and assist for proper log roll and for trunk side to sit, to side with A to guide feet in bed    Transfers Overall transfer level: Needs assistance Equipment used: Rolling walker (2 wheeled) Transfers: Sit to/from Stand Sit to Stand: Mod assist;From elevated surface         General transfer comment: comes up with min A but anterior lean needing heavy mod A to prevent anterior LOB, max cues for getting COG over BOS and for hip extension; stood x 3  Ambulation/Gait Ambulation/Gait assistance: Mod assist Gait Distance (Feet): 2 Feet Assistive device: Rolling walker (2 wheeled) Gait Pattern/deviations: Step-to pattern;Wide base of support;Narrow base of support;Decreased dorsiflexion - left     General Gait Details: just marching in place then side stepping to [facility] due to poor proprioceptive awareness of COG position over BOS and due to ataxic LE movement unsafe to attempt forward ambulation away from bed  Stairs            Wheelchair Mobility    Modified Rankin (Stroke Patients Only)       Balance Overall balance assessment: Needs assistance Sitting-balance support: Feet supported Sitting balance-Leahy Scale: Fair Sitting balance - Comments: needs support for safety for dynamic tasks Postural control: Other (comment) (anterior lean) Standing balance support: Bilateral upper extremity supported Standing balance-Leahy Scale: Poor Standing balance comment: reliant on  UE support and has weakness so fatigues and needs seated rest                             Pertinent Vitals/Pain Pain Assessment: 0-10 Pain Score: 7  Faces Pain Scale: Hurts little more Pain Location:  neck and shoulders Pain Descriptors / Indicators: Grimacing;Guarding;Aching Pain Intervention(s): Monitored during session;Repositioned;RN gave pain meds during session    Home Living Family/patient expects to be discharged to:: Private residence Living Arrangements: Spouse/significant other Available Help at Discharge: Family;Available 24 hours/day Type of Home: House Home Access: Stairs to enter Entrance Stairs-Rails: Psychiatric nurse of Steps: 3 Home Layout: One level Home Equipment: Walker - 2 wheels;Walker - 4 wheels;Shower seat - built in;Hand held shower head;Grab bars - tub/shower      Prior Function Level of Independence: Independent with assistive device(s)         Comments: usually would fall from initial standing, used rollator for longer distances, RW in the home     Hand Dominance   Dominant Hand: Right    Extremity/Trunk Assessment   Upper Extremity Assessment Upper Extremity Assessment: Defer to OT evaluation RUE Deficits / Details: 3-/5 grip strength, 4-/5 biceps/triceps/shoulder. numbness in hand/wrist. poor coordination, unable to hold objects or hold to walker at times. difficulty with opposition; difficulty coordinating UE movements for MMT RUE Sensation: decreased light touch;decreased proprioception RUE Coordination: decreased fine motor;decreased gross motor LUE Deficits / Details: strength WFL, pt reports R deficits > L UE LUE Sensation: decreased light touch LUE Coordination: decreased fine motor    Lower Extremity Assessment Lower Extremity Assessment: RLE deficits/detail;LLE deficits/detail RLE Deficits / Details: AROM WFL, strength hip flexion 3/5, knee extension 5/5, ankle DF 4/5, PF 4-/5, (reports R LE was stronger, now weaker) RLE Sensation: decreased proprioception;decreased light touch RLE Coordination: decreased gross motor LLE Deficits / Details: AROM WFL, strength hip flexion 3-/5, knee extension 4-/5, ankle DF 3+/5,  PF 3/5, LLE Sensation: decreased light touch;decreased proprioception LLE Coordination: decreased gross motor    Cervical / Trunk Assessment Cervical / Trunk Assessment: Other exceptions Cervical / Trunk Exceptions: rounded shoulders; s/p ACDF with soft cervical collar in place; sits with collapsed trunk, pain in shoulers and neck with cues to correct posture  Communication   Communication: No difficulties  Cognition Arousal/Alertness: Awake/alert Behavior During Therapy: Impulsive Overall Cognitive Status: Within Functional Limits for tasks assessed                                        General Comments General comments (skin integrity, edema, etc.): wife present and supportive; discussed recommendations for CIR    Exercises Other Exercises Other Exercises: educated to perform glut sets and coordination activities with slower more controlled movements for ankle pumps and when reaching for cup, etc   Assessment/Plan    PT Assessment Patient needs continued PT services  PT Problem List Decreased strength;Decreased mobility;Decreased safety awareness;Decreased balance;Decreased coordination;Decreased activity tolerance;Impaired sensation;Decreased knowledge of use of DME       PT Treatment Interventions DME instruction;Therapeutic activities;Gait training;Patient/family education;Therapeutic exercise;Stair training;Balance training;Functional mobility training;Neuromuscular re-education    PT Goals (Current goals can be found in the Care Plan section)  Acute Rehab PT Goals Patient Stated Goal: do whatever it takes to get better and walk again PT Goal Formulation: With patient/family Time For Goal Achievement: 08/06/21 Potential to Achieve  Goals: Good    Frequency Min 4X/week   Barriers to discharge        Co-evaluation               AM-PAC PT "6 Clicks" Mobility  Outcome Measure Help needed turning from your back to your side while in a flat bed  without using bedrails?: A Little Help needed moving from lying on your back to sitting on the side of a flat bed without using bedrails?: A Little Help needed moving to and from a bed to a chair (including a wheelchair)?: A Lot Help needed standing up from a chair using your arms (e.g., wheelchair or bedside chair)?: A Lot Help needed to walk in hospital room?: Total Help needed climbing 3-5 steps with a railing? : Total 6 Click Score: 12    End of Session Equipment Utilized During Treatment: Cervical collar Activity Tolerance: Patient limited by fatigue Patient left: in bed;with family/visitor present;with call bell/phone within reach Nurse Communication: Mobility status PT Visit Diagnosis: Other abnormalities of gait and mobility (R26.89);Ataxic gait (R26.0);Muscle weakness (generalized) (M62.81);Other symptoms and signs involving the nervous system (R29.898);Pain Pain - Right/Left:  (bilateral) Pain - part of body: Shoulder    Time: 5974-1638 PT Time Calculation (min) (ACUTE ONLY): 25 min   Charges:   PT Evaluation $PT Eval Moderate Complexity: 1 Mod PT Treatments $Therapeutic Activity: 8-22 mins        Magda Kiel, PT Acute Rehabilitation Services GTXMI:[phone] Office:[phone] 07/23/2021   Reginia Naas 07/23/2021, 9:54 AM

## 2021-07-23 NOTE — Progress Notes (Signed)
Inpatient Rehab Admissions Coordinator Note:   Per PT/OT recommendations patient was screened for CIR candidacy by Michel Santee, PT. At this time, pt appears to be a potential candidate for CIR. I will place an order for rehab consult for full assessment, per our protocol.  Please contact me any with questions.Shann Medal, PT, DPT [phone] 07/23/21 1:16 PM

## 2021-07-24 MED ORDER — CLONIDINE HCL 0.1 MG PO TABS
0.1000 mg | ORAL_TABLET | Freq: Three times a day (TID) | ORAL | Status: DC | PRN
Start: 2021-07-24 — End: 2021-08-01
  Administered 2021-07-24: 0.1 mg via ORAL
  Filled 2021-07-24: qty 1

## 2021-07-24 NOTE — Progress Notes (Signed)
Inpatient Rehab Admissions Coordinator:   Met with patient at bedside to discuss potential CIR admission. Pt. Stated interest. Wife was present and confirmed support at d/c. Will pursue for potential admit , pending bed availability, insurance auth, and medical readiness.   Clemens Catholic, [place], [place] Admissions Coordinator  [phone] ([place]) [phone] (office)

## 2021-07-24 NOTE — PMR Pre-admission (Signed)
PMR Admission Coordinator Pre-Admission Assessment  Patient: [name] is an 70 y.o., male MRN: [id number] DOB: 10/29/1951 Height: 5' 11" (180.3 cm) Weight: 105.2 kg  Insurance Information HMO: yes    PPO:      PCP:      IPA:      80/20:      OTHER:  PRIMARY: U[employer] Medicare      Policy#: [id number]      Subscriber: Pt CM Name: [name]      Phone#: [phone]     Fax#: [phone] Pre-Cert#: [id number]      Employer:  [name] with [employer] called with on 07/27/21 with approval for 7 days. With clinical updates due 08/02/21 Benefits:  Phone #: [phone]     Name: Pt.  Eff Date: 10/31/2020 - present Deductible: $0 (does not have deductible) OOP Max: $3,600 ($397.15 met) CIR: $295/day co-pay for days 1-5, $0/day co-pay for days 6+ SNF: $0.00 Copayment per day for days 1-20; $188.00 Copayment per day for days 21-40; $0.00 Copayment per day for days 41-100 for Medicare-covered care/maximum 100 days/benefit period Outpatient: $30/visit co-pay Home Health:  100% coverage, 0% co-insurance; limited by medical necessity DME: 80% coverage; 20% co-insurance Providers: in network  SECONDARY: none      Policy#:      Phone#:   The "Data Collection Information Summary" for patients in Inpatient Rehabilitation Facilities with attached "Privacy Act [place] Records" was provided and verbally reviewed with: Patient  Emergency Contact Information Contact Information     Name Relation Home Work Mobile   Burns,Oscar Spouse [phone]  [phone]   [name] Daughter   [phone]       Current Medical History  Patient Admitting Diagnosis: Cervical Myelopathy  History of Present Illness: [name] is a 70 year old right-handed male with history significant for ITP, hypertension, CAD with CABG 2019, hyperlipidemia.  Per chart review patient lives with spouse.  1 level home 3 steps to entry.  Independent with assistive device.  Patient with recent admission for C3/4 ACDF  by 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  He was discharged home 07/20/2021 ambulating 400 feet supervision without assistive device.  Presented 07/22/2021 with numbness tingling, lightening sensation and weakness going down his arms and legs while taking a shower.  Work-up and imaging showed epidural hematoma at C3-4 with cord compression.  Underwent revision arthrodesis C3-4 anterior interbody technique evacuation of epidural hematoma placement of intervertebral biomechanical device C3-4 07/22/2021 per [name].  Patient continues with cervical brace.  Tolerating a regular consistency diet.CIR was consulted to assist return to PLOF    Patient's medical record from [facility] has been reviewed by the rehabilitation admission coordinator and physician.  Past Medical History  Past Medical History:  Diagnosis Date   Allergy    Carpal tunnel syndrome    Chronic back pain    Chronic combined systolic and diastolic congestive heart failure (HCC)    Chronic ITP (idiopathic thrombocytopenia) (HCC) 05/25/2015   Coronary artery disease    Coronary artery disease involving native coronary artery of native heart with angina pectoris (HCC)    Degenerative disc disease    GERD (gastroesophageal reflux disease)    History of thrombocytopenia    Hypercholesteremia    Hypertension    Hypothyroid    Lumbar pain    Myocardial infarction ([place]) 2009   Obesity    Pneumonia    around age 63   S/P CABG x 3 04/27/2018  LIMA to LAD SVG to OM1 SVG to OM2   Shortness of breath dyspnea     Has the patient had major surgery during 100 days prior to admission? Yes  Family History   family history includes Colon cancer in his maternal grandmother; Heart attack in his father; Heart disease in his father.  Current Medications  Current Facility-Administered Medications:    0.9 %  sodium chloride infusion, 250 mL, Intravenous, Continuous,  Dawley, Troy C, DO   acetaminophen (TYLENOL) tablet 650 mg, 650 mg, Oral, Q6H PRN **OR** acetaminophen (TYLENOL) suppository 650 mg, 650 mg, Rectal, Q6H PRN, Dawley, Troy C, DO   albuterol (PROVENTIL) (2.5 MG/3ML) 0.083% nebulizer solution 3 mL, 3 mL, Inhalation, Q6H PRN, Dawley, Troy C, DO   alum & mag hydroxide-simeth (MAALOX/MYLANTA) 200-200-20 MG/5ML suspension 30 mL, 30 mL, Oral, Q6H PRN, Dawley, Troy C, DO   bisacodyl (DULCOLAX) suppository 10 mg, 10 mg, Rectal, Daily PRN, Dawley, Troy C, DO   Chlorhexidine Gluconate Cloth 2 % PADS 6 each, 6 each, Topical, Daily, Dawley, Troy C, DO, 6 each at 07/24/21 0824   citalopram (CELEXA) tablet 20 mg, 20 mg, Oral, q morning, Dawley, Troy C, DO, 20 mg at 07/24/21 1740   cloNIDine (CATAPRES) tablet 0.1 mg, 0.1 mg, Oral, Q8H PRN, Ashok Pall, MD, 0.1 mg at 07/24/21 0407   docusate sodium (COLACE) capsule 100 mg, 100 mg, Oral, BID, Dawley, Troy C, DO, 100 mg at 07/24/21 8144   HYDROcodone-acetaminophen (NORCO) 7.5-325 MG per tablet 2 tablet, 2 tablet, Oral, Q4H PRN, Dawley, Troy C, DO, 2 tablet at 07/24/21 8185   HYDROmorphone (DILAUDID) injection 0.5-1 mg, 0.5-1 mg, Intravenous, Q2H PRN, Dawley, Troy C, DO, 1 mg at 07/24/21 1008   levothyroxine (SYNTHROID) tablet 150 mcg, 150 mcg, Oral, Q0600, Dawley, Troy C, DO, 150 mcg at 07/24/21 0605   lisinopril (ZESTRIL) tablet 10 mg, 10 mg, Oral, Daily, Dawley, Troy C, DO, 10 mg at 07/24/21 6314   menthol-cetylpyridinium (CEPACOL) lozenge 3 mg, 1 lozenge, Oral, PRN **OR** phenol (CHLORASEPTIC) mouth spray 1 spray, 1 spray, Mouth/Throat, PRN, Dawley, Troy C, DO   methocarbamol (ROBAXIN) 500 mg in dextrose 5 % 50 mL IVPB, 500 mg, Intravenous, Q6H PRN, Dawley, Troy C, DO   methocarbamol (ROBAXIN) tablet 500 mg, 500 mg, Oral, Q6H PRN, Dawley, Troy C, DO, 500 mg at 07/24/21 0346   metoprolol tartrate (LOPRESSOR) tablet 50 mg, 50 mg, Oral, BID, Dawley, Troy C, DO, 50 mg at 07/24/21 9702   multivitamin with minerals  tablet 1 tablet, 1 tablet, Oral, Daily, Dawley, Troy C, DO, 1 tablet at 07/24/21 6378   mupirocin ointment (BACTROBAN) 2 % 1 application, 1 application, Nasal, BID, Dawley, Troy C, DO, 1 application at 58/85/02 1005   ondansetron (ZOFRAN) tablet 4 mg, 4 mg, Oral, Q6H PRN **OR** ondansetron (ZOFRAN) injection 4 mg, 4 mg, Intravenous, Q6H PRN, Dawley, Troy C, DO   pantoprazole (PROTONIX) EC tablet 40 mg, 40 mg, Oral, Daily, Dawley, Troy C, DO, 40 mg at 07/24/21 7741   polyethylene glycol (MIRALAX / GLYCOLAX) packet 17 g, 17 g, Oral, Daily PRN, Dawley, Troy C, DO, 17 g at 07/24/21 2878   potassium chloride (KLOR-CON) CR tablet 10 mEq, 10 mEq, Oral, Daily, Dawley, Troy C, DO, 10 mEq at 07/24/21 0824   rosuvastatin (CRESTOR) tablet 40 mg, 40 mg, Oral, Daily, Dawley, Troy C, DO, 40 mg at 07/24/21 6767   sodium chloride flush (NS) 0.9 % injection 3 mL, 3 mL, Intravenous, Q12H, Dawley, Pieter Partridge  C, DO, 3 mL at 07/23/21 2144   sodium chloride flush (NS) 0.9 % injection 3 mL, 3 mL, Intravenous, Q12H, Dawley, Troy C, DO, 3 mL at 07/24/21 0825   sodium chloride flush (NS) 0.9 % injection 3 mL, 3 mL, Intravenous, PRN, Dawley, Troy C, DO   sodium phosphate (FLEET) 7-19 GM/118ML enema 1 enema, 1 enema, Rectal, Once PRN, Dawley, Troy C, DO   tamsulosin (FLOMAX) capsule 0.8 mg, 0.8 mg, Oral, QPC supper, Dawley, Troy C, DO, 0.8 mg at 07/23/21 1816   torsemide (DEMADEX) tablet 20 mg, 20 mg, Oral, BID, Dawley, Troy C, DO, 20 mg at 07/24/21 3646  Patients Current Diet:  Diet Order             Diet regular Room service appropriate? Yes; Fluid consistency: Thin  Diet effective now                   Precautions / Restrictions Precautions Precautions: Fall, Cervical Precaution Booklet Issued: No Precaution Comments: reviewed precautions Cervical Brace: Soft collar, Other (comment) Restrictions Weight Bearing Restrictions: No   Has the patient had 2 or more falls or a fall with injury in the past year?  Yes  Prior Activity Level Community (5-7x/wk): Pt. went out most days  Prior Functional Level Self Care: Did the patient need help bathing, dressing, using the toilet or eating? Independent  Indoor Mobility: Did the patient need assistance with walking from room to room (with or without device)? Independent  Stairs: Did the patient need assistance with internal or external stairs (with or without device)? Independent  Functional Cognition: Did the patient need help planning regular tasks such as shopping or remembering to take medications? Independent  Patient Information Are you of Hispanic, Latino/a,or Spanish origin?: A. No, not of Hispanic, Latino/a, or Spanish origin What is your race?: A. White Do you need or want an interpreter to communicate with a doctor or health care staff?: 0. No  Patient's Response To:  Health Literacy and Transportation Is the patient able to respond to health literacy and transportation needs?: Yes Health Literacy - How often do you need to have someone help you when you read instructions, pamphlets, or other written material from your doctor or pharmacy?: Never In the past 12 months, has lack of transportation kept you from medical appointments or from getting medications?: No In the past 12 months, has lack of transportation kept you from meetings, work, or from getting things needed for daily living?: No  Home Assistive Devices / [place]: Environmental consultant - 2 wheels, Environmental consultant - 4 wheels, Shower seat - built in, [employer] held shower head, Grab bars - tub/shower  Prior Device Use: Indicate devices/aids used by the patient prior to current illness, exacerbation or injury? None of the above  Current Functional Level Cognition  Overall Cognitive Status: Within Functional Limits for tasks assessed Orientation Level: Oriented X4    Extremity Assessment (includes Sensation/Coordination)  Upper Extremity Assessment: Defer to OT evaluation RUE Deficits  / Details: 3-/5 grip strength, 4-/5 biceps/triceps/shoulder. numbness in hand/wrist. poor coordination, unable to hold objects or hold to walker at times. difficulty with opposition; difficulty coordinating UE movements for MMT RUE Sensation: decreased light touch, decreased proprioception RUE Coordination: decreased fine motor, decreased gross motor LUE Deficits / Details: strength WFL, pt reports R deficits > L UE LUE Sensation: decreased light touch LUE Coordination: decreased fine motor  Lower Extremity Assessment: RLE deficits/detail, LLE deficits/detail RLE Deficits / Details: AROM WFL, strength hip  flexion 3/5, knee extension 5/5, ankle DF 4/5, PF 4-/5, (reports R LE was stronger, now weaker) RLE Sensation: decreased proprioception, decreased light touch RLE Coordination: decreased gross motor LLE Deficits / Details: AROM WFL, strength hip flexion 3-/5, knee extension 4-/5, ankle DF 3+/5, PF 3/5, LLE Sensation: decreased light touch, decreased proprioception LLE Coordination: decreased gross motor    ADLs  Overall ADL's : Needs assistance/impaired Eating/Feeding: Minimal assistance, Sitting Eating/Feeding Details (indicate cue type and reason): for opening containers, cutting food and managing spillage Grooming: Minimal assistance, Sitting Upper Body Bathing: Minimal assistance, Sitting Lower Body Bathing: Moderate assistance, Sitting/lateral leans, Sit to/from stand Upper Body Dressing : Minimal assistance, Sitting Lower Body Dressing: Maximal assistance, Sit to/from stand Toileting- Clothing Manipulation and Hygiene: Maximal assistance, Sitting/lateral lean, Sit to/from stand General ADL Comments: Pt with significant deficits in coordination, R UE strength, BLE strength and balance with inability to ambulate or complete ADLs without increased physical assist    Mobility  Overal bed mobility: Needs Assistance Bed Mobility: Rolling, Sidelying to Sit, Sit to Sidelying Rolling: Min  assist Sidelying to sit: Min assist Sit to supine: Mod assist Sit to sidelying: Min assist General bed mobility comments: initially poor technique so cues and assist for proper log roll and for trunk side to sit, to side with A to guide feet in bed    Transfers  Overall transfer level: Needs assistance Equipment used: Rolling walker (2 wheeled) Transfers: Sit to/from Stand Sit to Stand: Mod assist, From elevated surface General transfer comment: comes up with min A but anterior lean needing heavy mod A to prevent anterior LOB, max cues for getting COG over BOS and for hip extension; stood x 3    Ambulation / Gait / Stairs / Wheelchair Mobility  Ambulation/Gait Ambulation/Gait assistance: Mod assist Gait Distance (Feet): 2 Feet Assistive device: Rolling walker (2 wheeled) Gait Pattern/deviations: Step-to pattern, Wide base of support, Narrow base of support, Decreased dorsiflexion - left General Gait Details: just marching in place then side stepping to [facility] due to poor proprioceptive awareness of COG position over BOS and due to ataxic LE movement unsafe to attempt forward ambulation away from bed    Posture / Balance Dynamic Sitting Balance Sitting balance - Comments: needs support for safety for dynamic tasks Balance Overall balance assessment: Needs assistance Sitting-balance support: Feet supported Sitting balance-Leahy Scale: Fair Sitting balance - Comments: needs support for safety for dynamic tasks Postural control: Other (comment) (anterior lean) Standing balance support: Bilateral upper extremity supported Standing balance-Leahy Scale: Poor Standing balance comment: reliant on UE support and has weakness so fatigues and needs seated rest    Special needs/care consideration Skin surgical incision on posterior neck, ecchymosis on BUEs and Special service needs none   Previous Home Environment (from acute therapy documentation) Living Arrangements: Spouse/significant other   Lives With: Spouse Available Help at Discharge: Family, Available 24 hours/day Type of Home: House Home Layout: One level Home Access: Stairs to enter Entrance Stairs-Rails: Right, Left Entrance Stairs-Number of Steps: 3 Bathroom Shower/Tub: Multimedia programmer: Handicapped height  Discharge Living Setting Plans for Discharge Living Setting: Patient's home Type of Home at Discharge: House Discharge Home Layout: One level Discharge Home Access: Stairs to enter Entrance Stairs-Rails: Right, Left Entrance Stairs-Number of Steps: 3 Discharge Bathroom Shower/Tub: Walk-in shower Discharge Bathroom Toilet: Handicapped height Discharge Bathroom Accessibility: Yes How Accessible: Accessible via walker Does the patient have any problems obtaining your medications?: Yes (Describe)  Social/Family/Support Systems Patient Roles: Spouse Contact Information:  [phone] Anticipated Caregiver: Amandeep Nesmith Anticipated Caregiver's Contact Information: [phone] Ability/Limitations of Caregiver: Can do min A Caregiver Availability: 24/7 Discharge Plan Discussed with Primary Caregiver: Yes Is Caregiver In Agreement with Plan?: Yes Does Caregiver/Family have Issues with Lodging/Transportation while Pt is in Rehab?: No  Goals Patient/Family Goal for Rehab: PT/OT Min A Expected length of stay: 10-12 days Pt/Family Agrees to Admission and willing to participate: Yes Program Orientation Provided & Reviewed with Pt/Caregiver Including Roles  & Responsibilities: Yes  Decrease burden of Care through IP rehab admission: Specialzed equipment needs, Decrease number of caregivers, Bowel and bladder program, and Patient/family education  Possible need for SNF placement upon discharge: not anticipated  Patient Condition: I have reviewed medical records from [facility], spoken with CM, and patient and spouse. I met with patient at the bedside for inpatient rehabilitation  assessment.  Patient will benefit from ongoing PT, OT, and SLP, can actively participate in 3 hours of therapy a day 5 days of the week, and can make measurable gains during the admission.  Patient will also benefit from the coordinated team approach during an Inpatient Acute Rehabilitation admission.  The patient will receive intensive therapy as well as Rehabilitation physician, nursing, social worker, and care management interventions.  Due to safety, skin/wound care, disease management, medication administration, pain management, and patient education the patient requires 24 hour a day rehabilitation nursing.  The patient is currently min-mod+2  with mobility and basic ADLs.  Discharge setting and therapy post discharge at home with home health is anticipated.  Patient has agreed to participate in the Acute Inpatient Rehabilitation Program and will admit Sunday 08/01/21.  Preadmission Screen Completed By:  Genella Mech, 07/24/2021 2:00 PM ______________________________________________________________________   Discussed status with Dr. Posey Pronto  on 07/30/21  at 94 and received approval for admission today.  Admission Coordinator:  Genella Mech, CCC-SLP, time 1100/Date 07/30/21   Assessment/Plan: Diagnosis: Does the need for close, 24 hr/day Medical supervision in concert with the patient's rehab needs make it unreasonable for this patient to be served in a less intensive setting? Yes Co-Morbidities requiring supervision/potential complications: epidural hematoma s/p cord compression and evacuation; dysphonic; generalized weakness Due to bladder management, bowel management, safety, skin/wound care, disease management, medication administration, pain management, and patient education, does the patient require 24 hr/day rehab nursing? Yes Does the patient require coordinated care of a physician, rehab nurse, PT, OT, and SLP to address physical and functional deficits in the context of the above medical  diagnosis(es)? Yes Addressing deficits in the following areas: balance, endurance, locomotion, strength, transferring, bowel/bladder control, bathing, dressing, feeding, grooming, and toileting Can the patient actively participate in an intensive therapy program of at least 3 hrs of therapy 5 days a week? Yes The potential for patient to make measurable gains while on inpatient rehab is good Anticipated functional outcomes upon discharge from inpatient rehab: supervision and min assist PT, supervision and min assist OT, n/a SLP Estimated rehab length of stay to reach the above functional goals is: 10-12 days Anticipated discharge destination: Home 10. Overall Rehab/Functional Prognosis: good   MD Signature:

## 2021-07-24 NOTE — Progress Notes (Signed)
Patient ID: [name], male   DOB: Jul 27, 1951, 70 y.o.   MRN: [id number] Vital signs are stable.  Patient is swallowing well.  He is feeling some numbness and weakness in his hands.  PT to work with him today.  Continue to observe.  S post decompression for epidural hematoma.

## 2021-07-25 NOTE — Plan of Care (Signed)
  Problem: Activity: Goal: Ability to avoid complications of mobility impairment will improve Outcome: Progressing Goal: Ability to tolerate increased activity will improve Outcome: Progressing Goal: Will remain free from falls Outcome: Progressing   Problem: Safety: Goal: Ability to remain free from injury will improve Outcome: Progressing

## 2021-07-25 NOTE — Progress Notes (Signed)
Patient ID: [name], male   DOB: 1951/06/26, 70 y.o.   MRN: [id number] BP 127/74 (BP Location: Right Arm)   Pulse 67   Temp 97.7 F (36.5 C) (Oral)   Resp 16   Ht 5\' 11"  (1.803 m)   Wt 105.2 kg   SpO2 96%   BMI 32.35 kg/m  Alert and oriented x 4, speech is clear and fluent Moving all extremities, weakness right upper extremity.  Wound is clean dry and without signs of infection Improving, voice is strong

## 2021-07-26 NOTE — Progress Notes (Signed)
Occupational Therapy Treatment Patient Details Name: [name] MRN: [id number] DOB: [birth year] Today's Date: 07/26/2021   History of present illness Pt is a 70 y/o male who is s/p C3/4 ACDF by [name] on 07/19/2021 and discharged home in stable condition on 07/20/2021. Pt presented to ED on 9/22 due to acute onset of shooting pain, B LE weakness, R UE weakness and inability to ambulate. Pt noted with epidural hematoma and underwent ACDF revision at C3-4 and evacuation of hematoma on 9/22. PMH: carpal tunnel syndrome, CHF, CAD, DDD, GERD, HTN, MI, CABG x 3 (2019), hx of back surgery (2015)   OT comments  This 70 yo male admitted with above presents to acute OT with making progress with overall mobility and increased movement of LUE (but still weak and lack of coordination). He will continue to benefit from acute OT with follow up on CIR.   Recommendations for follow up therapy are one component of a multi-disciplinary discharge planning process, led by the attending physician.  Recommendations may be updated based on patient status, additional functional criteria and insurance authorization.    Follow Up Recommendations  CIR;Supervision/Assistance - 24 hour    Equipment Recommendations  Other (comment) (TBD next venue)       Precautions / Restrictions Precautions Precautions: Fall;Cervical Precaution Booklet Issued: No Precaution Comments: pt able to verbalize how he needs to move to keep precautions Required Braces or Orthoses: Cervical Brace Cervical Brace: Soft collar;Other (comment) Restrictions Weight Bearing Restrictions: No       Mobility Bed Mobility Overal bed mobility: Needs Assistance Bed Mobility: Rolling;Sidelying to Sit;Sit to Sidelying Rolling: Min guard Sidelying to sit: Min assist       General bed mobility comments: pt able to bridge knees and log roll, min HHA for elevation of trunk from SL to sit    Transfers Overall transfer level: Needs  assistance Equipment used: Rolling walker (2 wheeled) Transfers: Sit to/from Stand Sit to Stand: From elevated surface;+2 safety/equipment;Min assist;+2 physical assistance         General transfer comment: vc's for safe hand placement, min A +2 while pt gained balance, cues for posture in standing. Practiced from bed and recliner.    Balance Overall balance assessment: Needs assistance Sitting-balance support: Feet supported Sitting balance-Leahy Scale: Fair     Standing balance support: Bilateral upper extremity supported Standing balance-Leahy Scale: Poor Standing balance comment: reliant on UE support and has weakness so fatigues and needs seated rest                           ADL either performed or assessed with clinical judgement   ADL Overall ADL's : Needs assistance/impaired   Eating/Feeding Details (indicate cue type and reason): Pt has been eating mainly with LUE, he is getting good movement back in RUE but decreased coordination. Encouraged him to order at least one finger food item wtih each meal so he can eat with is right hand more (don't really want him using utensils as of yet due to hitting/poking himself with them). Asked his wife to bring in a "sippy cup"--one that won't spill so he can try and drink with that hand as well.                                         Vision Baseline Vision/History: 1 Wears glasses  Cognition Arousal/Alertness: Awake/alert Behavior During Therapy: Impulsive Overall Cognitive Status: Within Functional Limits for tasks assessed                                          Exercises Exercises: General Lower Extremity General Exercises - Lower Extremity Ankle Circles/Pumps: AROM;Both;5 reps;Seated Long Arc Quad: AROM;Both;5 reps;Seated Other Exercises Other Exercises: Had pt work with red putty (manipulating in hand, squeezing in hand, pinch and pulling it), his wife is to  bring in some of their kids duplo blocks for him to work on picking up with his right hand and maybe even putting them together.           Pertinent Vitals/ Pain       Pain Assessment: Faces Faces Pain Scale: Hurts even more Pain Location: neck and shoulders Pain Descriptors / Indicators: Grimacing;Guarding;Aching Pain Intervention(s): Limited activity within patient's tolerance;Monitored during session;Repositioned;Heat applied         Frequency  Min 2X/week        Progress Toward Goals  OT Goals(current goals can now be found in the care plan section)  Progress towards OT goals: Progressing toward goals  Acute Rehab OT Goals Patient Stated Goal: do whatever it takes to get better and walk again OT Goal Formulation: With patient Time For Goal Achievement: 08/06/21 Potential to Achieve Goals: Good  Plan Discharge plan remains appropriate    Co-evaluation    PT/OT/SLP Co-Evaluation/Treatment: Yes (partial) Reason for Co-Treatment: Complexity of the patient's impairments (multi-system involvement);For patient/therapist safety PT goals addressed during session: Mobility/safety with mobility;Balance;Proper use of DME;Strengthening/ROM OT goals addressed during session: ADL's and self-care;Strengthening/ROM      AM-PAC OT "6 Clicks" Daily Activity     Outcome Measure   Help from another person eating meals?: A Little Help from another person taking care of personal grooming?: A Little Help from another person toileting, which includes using toliet, bedpan, or urinal?: A Lot Help from another person bathing (including washing, rinsing, drying)?: A Lot Help from another person to put on and taking off regular upper body clothing?: A Little Help from another person to put on and taking off regular lower body clothing?: A Lot 6 Click Score: 15    End of Session Equipment Utilized During Treatment: Cervical collar;Rolling walker;Gait belt  OT Visit Diagnosis: Other  abnormalities of gait and mobility (R26.89);Muscle weakness (generalized) (M62.81);Unsteadiness on feet (R26.81);Pain Pain - part of body:  (back and neck and shoulders)   Activity Tolerance Patient tolerated treatment well   Patient Left in chair;with call bell/phone within reach;with chair alarm set;with family/visitor present   Nurse Communication Mobility status (NT; use sara stedy for back to bed)        Time: 5697-9480 OT Time Calculation (min): 42 min  Charges: OT General Charges $OT Visit: 1 Visit OT Treatments $Self Care/Home Management : 8-22 mins $Therapeutic Exercise: 8-22 mins  [place]en Circle, OTR/L Acute [employer] Pager [phone] Office [phone]    Almon Register 07/26/2021, 2:16 PM

## 2021-07-26 NOTE — Care Management Important Message (Signed)
Important Message  Patient Details  Name: [name] MRN: [id number] Date of Birth: 1951-03-30   Medicare Important Message Given:  Yes     [name] 07/26/2021, 4:35 PM

## 2021-07-26 NOTE — Progress Notes (Signed)
Inpatient Rehab Admissions Coordinator:   I do not have insurance auth or a bed available for this Pt. Today. I will continue to follow for potential admission pending insurance auth and bed availability.  Clemens Catholic, [place], [place] Admissions Coordinator  [phone] ([place]) [phone] (office)

## 2021-07-26 NOTE — Progress Notes (Signed)
Physical Therapy Treatment Patient Details Name: [name] MRN: [id number] DOB: 21-May-1951 Today's Date: 07/26/2021   History of Present Illness Pt is a 70 y/o male who is s/p C3/4 ACDF by [name] on 07/19/2021 and discharged home in stable condition on 07/20/2021. Pt presented to ED on 9/22 due to acute onset of shooting pain, B LE weakness, R UE weakness and inability to ambulate. Pt noted with epidural hematoma and underwent ACDF revision at C3-4 and evacuation of hematoma on 9/22. PMH: carpal tunnel syndrome, CHF, CAD, DDD, GERD, HTN, MI, CABG x 3 (2019), hx of back surgery (2015)    PT Comments    Pt making progress with strength UE's and LE's but continues to lack control, worked on controlled mvmts today with PT/OT. Pt ambulated 9' and 7' with RW and min A +2 until he became very fatigued and had uncontrolled RLE bkwd step and needed mod A +2 to prevent bkwd LOB. Pt very motivated to work and ambulate even when he becomes fatigued. Continue to recommend CIR for maximal recovery. PT will continue to follow.    Recommendations for follow up therapy are one component of a multi-disciplinary discharge planning process, led by the attending physician.  Recommendations may be updated based on patient status, additional functional criteria and insurance authorization.  Follow Up Recommendations  CIR     Equipment Recommendations  None recommended by PT    Recommendations for Other Services Rehab consult     Precautions / Restrictions Precautions Precautions: Fall;Cervical Precaution Booklet Issued: No Precaution Comments: pt able to verbalize how he needs to move to keep precautions Required Braces or Orthoses: Cervical Brace Cervical Brace: Soft collar;Other (comment) Restrictions Weight Bearing Restrictions: No     Mobility  Bed Mobility Overal bed mobility: Needs Assistance Bed Mobility: Rolling;Sidelying to Sit;Sit to Sidelying Rolling: Min guard Sidelying to sit: Min  assist       General bed mobility comments: pt able to bridge knees and log roll, min HHA for elevation of trunk from SL to sit    Transfers Overall transfer level: Needs assistance Equipment used: Rolling walker (2 wheeled) Transfers: Sit to/from Stand Sit to Stand: From elevated surface;+2 safety/equipment;Min assist;+2 physical assistance         General transfer comment: vc's for safe hand placement, min A +2 while pt gained balance, cues for posture in standing. Practiced from bed and recliner.  Ambulation/Gait Ambulation/Gait assistance: +2 safety/equipment;+2 physical assistance;Min assist;Mod assist Gait Distance (Feet): 16 Feet (9', seated rest, 7') Assistive device: Rolling walker (2 wheeled) Gait Pattern/deviations: Narrow base of support;Step-through pattern Gait velocity: decreased Gait velocity interpretation: <1.31 ft/sec, indicative of household ambulator General Gait Details: pt with significant wt through UE's on RW, narrow BOS (cued to widen slightly), pt's LE's became very fatigued after 9' and pt needed to sit. LE's still fatigued with second bout and at end of gait, pt with uncontrolled RLE step back with mod A +2 to correct to prevent fall   Stairs             Wheelchair Mobility    Modified Rankin (Stroke Patients Only)       Balance Overall balance assessment: Needs assistance Sitting-balance support: Feet supported Sitting balance-Leahy Scale: Fair     Standing balance support: Bilateral upper extremity supported Standing balance-Leahy Scale: Poor Standing balance comment: reliant on UE support and has weakness so fatigues and needs seated rest  Cognition Arousal/Alertness: Awake/alert Behavior During Therapy: Impulsive Overall Cognitive Status: Within Functional Limits for tasks assessed                                        Exercises General Exercises - Lower  Extremity Ankle Circles/Pumps: AROM;Both;5 reps;Seated Long Arc Quad: AROM;Both;5 reps;Seated Other Exercises Other Exercises: discussed performing all mvmts for control focusing on quality of mvmt not just quantity    General Comments        Pertinent Vitals/Pain Pain Assessment: Faces Faces Pain Scale: Hurts even more Pain Location: neck and shoulders Pain Descriptors / Indicators: Grimacing;Guarding;Aching Pain Intervention(s): Limited activity within patient's tolerance;Monitored during session    Home Living                      Prior Function            PT Goals (current goals can now be found in the care plan section) Acute Rehab PT Goals Patient Stated Goal: do whatever it takes to get better and walk again PT Goal Formulation: With patient/family Time For Goal Achievement: 08/06/21 Potential to Achieve Goals: Good Progress towards PT goals: Progressing toward goals    Frequency    Min 4X/week      PT Plan Current plan remains appropriate    Co-evaluation PT/OT/SLP Co-Evaluation/Treatment: Yes Reason for Co-Treatment: Complexity of the patient's impairments (multi-system involvement);For patient/therapist safety PT goals addressed during session: Mobility/safety with mobility;Balance;Proper use of DME;Strengthening/ROM        AM-PAC PT "6 Clicks" Mobility   Outcome Measure  Help needed turning from your back to your side while in a flat bed without using bedrails?: A Little Help needed moving from lying on your back to sitting on the side of a flat bed without using bedrails?: A Little Help needed moving to and from a bed to a chair (including a wheelchair)?: A Lot Help needed standing up from a chair using your arms (e.g., wheelchair or bedside chair)?: A Lot Help needed to walk in hospital room?: Total Help needed climbing 3-5 steps with a railing? : Total 6 Click Score: 12    End of Session   Activity Tolerance: Patient limited by  fatigue Patient left: with family/visitor present;with call bell/phone within reach;in chair;with chair alarm set Nurse Communication: Mobility status PT Visit Diagnosis: Other abnormalities of gait and mobility (R26.89);Ataxic gait (R26.0);Muscle weakness (generalized) (M62.81);Other symptoms and signs involving the nervous system (R29.898);Pain Pain - Right/Left:  (bilateral) Pain - part of body: Shoulder     Time: 1610-9604 PT Time Calculation (min) (ACUTE ONLY): 20 min  Charges:  $Gait Training: 8-22 mins                     [place]  Pager [phone] Office [place] 07/26/2021, 1:26 PM

## 2021-07-26 NOTE — Progress Notes (Signed)
Subjective: Patient reports that his swallowing is continuing to improve. He has some upper back and shoulder discomfort. No acute events overnight.   Objective: Vital signs in last 24 hours: Temp:  [98 F (36.7 C)-98.3 F (36.8 C)] 98.3 F (36.8 C) (09/26 0744) Pulse Rate:  [62-78] 69 (09/26 0744) Resp:  [16-20] 16 (09/26 0344) BP: (104-158)/(60-75) 158/73 (09/26 0744) SpO2:  [97 %-98 %] 98 % (09/26 0744)  Intake/Output from previous day: 09/25 0701 - 09/26 0700 In: 720 [P.O.:720] Out: 2000 [Urine:2000] Intake/Output this shift: No intake/output data recorded.   Physical Exam: Patient is awake, A/O X 4, conversant, and in good spirits. They are in NAD and VSS. Doing well. Speech is dysphonic, but fluent and appropriate. Some mild dysphagia. MAEW. Sensation is improving. PERLA, EOMI. CNs grossly intact. Dressing is clean dry intact. Incision is well approximated with no drainage, erythema, or edema.    Lab Results: No results for input(s): WBC, HGB, HCT, PLT in the last 72 hours. BMET No results for input(s): NA, K, CL, CO2, GLUCOSE, BUN, CREATININE, CALCIUM in the last 72 hours.  Studies/Results: No results found.  Assessment/Plan: 70 year old male who is s/p C3/4 ACDF by [name] on 07/19/2021 and s/p revision of his C3/4 ACDF and evacuation of his epidural hematoma by [name] on 07/22/2021. The patient is making slow progress but remains severely limited in his mobility and ability to perform his ADLs. He has weakness and numbness in his BUE and BLE that is slowly improving. Swallowing has improved. He has some mild pain in his shoulders and upper back. PT and OT recommending CIR Awaiting CIR placement. Continue PT/OT. Continue working on pain control and encouraging mobility.    LOS: 4 days     Marvis Moeller, DNP, NP-C 07/26/2021, 8:20 AM

## 2021-07-27 LAB — CULTURE, BLOOD (ROUTINE X 2)
Culture: NO GROWTH
Culture: NO GROWTH
Special Requests: ADEQUATE
Special Requests: ADEQUATE

## 2021-07-27 MED ORDER — OXYCODONE-ACETAMINOPHEN 5-325 MG PO TABS
1.0000 | ORAL_TABLET | ORAL | Status: DC | PRN
  Administered 2021-07-27 – 2021-07-29 (×10): 2 via ORAL
  Administered 2021-07-29: 1 via ORAL
  Administered 2021-07-29 – 2021-07-30 (×5): 2 via ORAL
  Administered 2021-07-30: 1 via ORAL
  Administered 2021-07-31 – 2021-08-01 (×8): 2 via ORAL
  Filled 2021-07-27 (×25): qty 2

## 2021-07-27 NOTE — Progress Notes (Addendum)
Subjective: Patient reports that he is continuing to make slow progress. He has appropriate upper back/shoulder pain that is well controlled. No acute events overnight.   Objective: Vital signs in last 24 hours: Temp:  [97.5 F (36.4 C)-98 F (36.7 C)] 97.7 F (36.5 C) (09/27 0501) Pulse Rate:  [63-67] 67 (09/27 0501) Resp:  [20] 20 (09/26 2130) BP: (111-135)/(66-71) 121/71 (09/27 0501) SpO2:  [94 %-99 %] 99 % (09/27 0501)  Intake/Output from previous day: 09/26 0701 - 09/27 0700 In: -  Out: 2050 [Urine:2050] Intake/Output this shift: No intake/output data recorded.  Physical Exam: Patient is awake, A/O X 4, conversant, and in good spirits. They are in NAD and VSS. Doing well. Speech is dysphonic, but fluent and appropriate. Some mild dysphagia. MAEW. Sensation is improving. PERLA, EOMI. CNs grossly intact. Dressing is clean dry intact. Incision is well approximated with no drainage, erythema, or edema.   Lab Results: No results for input(s): WBC, HGB, HCT, PLT in the last 72 hours. BMET No results for input(s): NA, K, CL, CO2, GLUCOSE, BUN, CREATININE, CALCIUM in the last 72 hours.  Studies/Results: No results found.  Assessment/Plan: 70 year old male who is s/p C3/4 ACDF by [name] on 07/19/2021 and s/p revision of his C3/4 ACDF and evacuation of his epidural hematoma by [name] on 07/22/2021. The patient is continuing to make some improvements. He has weakness and numbness in his BUE and BLE that is slowly improving, left greater than right. Swallowing continues to improved. Awaiting CIR placement. Continue PT/OT. Continue working on pain control and encouraging mobility.  LOS: 5 days     Marvis Moeller, DNP, NP-C 07/27/2021, 9:02 AM  Addendum:  Patient seen and examined.  Agree with above.

## 2021-07-27 NOTE — Progress Notes (Signed)
Inpatient Rehab Admissions Coordinator:       I do not have insurance auth or a CIR bed for this pt. Today. I will continue to follow for potential admit pending insurance auth or bed availability.  Clemens Catholic, [place], [place] Admissions Coordinator  [phone] ([place]) [phone] (office)

## 2021-07-27 NOTE — Progress Notes (Signed)
Physical Therapy Treatment Patient Details Name: [name] MRN: [id number] DOB: 1950-12-14 Today's Date: 07/27/2021   History of Present Illness Pt is a 70 y/o male who is s/p C3/4 ACDF by [name] on 07/19/2021 and discharged home in stable condition on 07/20/2021. Pt presented to ED on 9/22 due to acute onset of shooting pain, B LE weakness, R UE weakness and inability to ambulate. Pt noted with epidural hematoma and underwent ACDF revision at C3-4 and evacuation of hematoma on 9/22. PMH: carpal tunnel syndrome, CHF, CAD, DDD, GERD, HTN, MI, CABG x 3 (2019), hx of back surgery (2015)    PT Comments    Pt mobilized with upright rollator today, tolerated increased distance but continues to need mod A +2 for coordination and safety. Ambulated 10', seated rest, 20', seated rest, 10', seated rest. Was encouraged to be able to get out of room. Pt very motivated to be more independent. Wife present for session. Continue to recommend CIR. PT will continue to follow.    Recommendations for follow up therapy are one component of a multi-disciplinary discharge planning process, led by the attending physician.  Recommendations may be updated based on patient status, additional functional criteria and insurance authorization.  Follow Up Recommendations  CIR     Equipment Recommendations  None recommended by PT    Recommendations for Other Services Rehab consult     Precautions / Restrictions Precautions Precautions: Fall;Cervical Precaution Booklet Issued: No Required Braces or Orthoses: Cervical Brace Cervical Brace: Soft collar Restrictions Weight Bearing Restrictions: No     Mobility  Bed Mobility Overal bed mobility: Needs Assistance Bed Mobility: Rolling;Sidelying to Sit Rolling: Min guard Sidelying to sit: Min assist       General bed mobility comments: min A to LE's, increased time needed    Transfers Overall transfer level: Needs assistance Equipment used: Rolling  walker (2 wheeled) Transfers: Sit to/from Stand Sit to Stand: +2 safety/equipment;Min assist;+2 physical assistance         General transfer comment: min A for power up, +2 to steady, vc's needed for upright posture before beginning to ambulate.  Ambulation/Gait Ambulation/Gait assistance: +2 safety/equipment;+2 physical assistance;Mod assist Gait Distance (Feet): 50 Feet (10', 20', 20') Assistive device: 4-wheeled walker (upright rollator) Gait Pattern/deviations: Narrow base of support;Step-through pattern;Scissoring Gait velocity: decreased Gait velocity interpretation: <1.31 ft/sec, indicative of household ambulator General Gait Details: used upright rollator for improved posture but he puts so much wt through upper body, therapist had to keep foot in front of rollator for safety. cues for smaller L step so that he can step through with R. Very narrow BOS with occasional scissoring. Needed 3 seated rest breaks.   Stairs             Wheelchair Mobility    Modified Rankin (Stroke Patients Only)       Balance Overall balance assessment: Needs assistance Sitting-balance support: Feet supported Sitting balance-Leahy Scale: Fair Sitting balance - Comments: needs support for safety for dynamic tasks   Standing balance support: Bilateral upper extremity supported Standing balance-Leahy Scale: Poor Standing balance comment: reliant on UE and external support                            Cognition Arousal/Alertness: Awake/alert Behavior During Therapy: WFL for tasks assessed/performed Overall Cognitive Status: Within Functional Limits for tasks assessed  Exercises General Exercises - Lower Extremity Ankle Circles/Pumps: AROM;Both;Seated;10 reps Long Arc Quad: AROM;Both;Seated;10 reps Hip Flexion/Marching: AROM;Both;10 reps;Seated    General Comments General comments (skin integrity, edema, etc.): wife  present. HR 100 bpm after ambulation      Pertinent Vitals/Pain Pain Assessment: Faces Faces Pain Scale: Hurts even more Pain Location: neck and shoulders Pain Descriptors / Indicators: Grimacing;Guarding;Aching Pain Intervention(s): Limited activity within patient's tolerance;Monitored during session    Home Living                      Prior Function            PT Goals (current goals can now be found in the care plan section) Acute Rehab PT Goals Patient Stated Goal: do whatever it takes to get better and walk again PT Goal Formulation: With patient/family Time For Goal Achievement: 08/06/21 Potential to Achieve Goals: Good Progress towards PT goals: Progressing toward goals    Frequency    Min 4X/week      PT Plan Current plan remains appropriate    Co-evaluation              AM-PAC PT "6 Clicks" Mobility   Outcome Measure  Help needed turning from your back to your side while in a flat bed without using bedrails?: A Little Help needed moving from lying on your back to sitting on the side of a flat bed without using bedrails?: A Little Help needed moving to and from a bed to a chair (including a wheelchair)?: A Lot Help needed standing up from a chair using your arms (e.g., wheelchair or bedside chair)?: A Lot Help needed to walk in hospital room?: Total Help needed climbing 3-5 steps with a railing? : Total 6 Click Score: 12    End of Session Equipment Utilized During Treatment: Cervical collar;Gait belt Activity Tolerance: Patient limited by fatigue Patient left: with family/visitor present;with call bell/phone within reach;in chair;with chair alarm set Nurse Communication: Mobility status PT Visit Diagnosis: Other abnormalities of gait and mobility (R26.89);Ataxic gait (R26.0);Muscle weakness (generalized) (M62.81);Other symptoms and signs involving the nervous system (R29.898);Pain Pain - Right/Left:  (bilateral) Pain - part of body:  Shoulder     Time: 6754-4920 PT Time Calculation (min) (ACUTE ONLY): 30 min  Charges:  $Gait Training: 23-37 mins                     [place]  Pager [phone] Office [place] 07/27/2021, 4:48 PM

## 2021-07-28 NOTE — Progress Notes (Signed)
Inpatient Rehab Admissions Coordinator:   I have insurance auth but do not have a bed for this pt. On CIR today. I will follow for potential admit pending bed availability  Clemens Catholic, [place], [employer] Admissions Coordinator  [phone] ([place]) [phone] (office)

## 2021-07-28 NOTE — Progress Notes (Signed)
Physical Therapy Treatment Patient Details Name: [name] MRN: [id number] DOB: 1951-08-05 Today's Date: 07/28/2021   History of Present Illness Pt is a 70 y/o male who is s/p C3/4 ACDF by [name] on 07/19/2021 and discharged home in stable condition on 07/20/2021. Pt presented to ED on 9/22 due to acute onset of shooting pain, B LE weakness, R UE weakness and inability to ambulate. Pt noted with epidural hematoma and underwent ACDF revision at C3-4 and evacuation of hematoma on 9/22. PMH: carpal tunnel syndrome, CHF, CAD, DDD, GERD, HTN, MI, CABG x 3 (2019), hx of back surgery (2015)    PT Comments    Patient unable to ambulate this session and even to maintain standing with UE support.  Feels R UE is weaker and note R knee buckling in standing in [place].  Stood three times with walker and 5 times with [place].  Wearing soft collar throughout and no c/o dizziness.  Feel he will need CIR level rehab at d/c.  Pt. Asking about having wheelchair at home for increased mobility.    Recommendations for follow up therapy are one component of a multi-disciplinary discharge planning process, led by the attending physician.  Recommendations may be updated based on patient status, additional functional criteria and insurance authorization.  Follow Up Recommendations  CIR     Equipment Recommendations  None recommended by PT    Recommendations for Other Services       Precautions / Restrictions Precautions Precautions: Fall;Cervical Required Braces or Orthoses: Cervical Brace Cervical Brace: Soft collar     Mobility  Bed Mobility Overal bed mobility: Needs Assistance Bed Mobility: Rolling;Sidelying to Sit Rolling: Supervision Sidelying to sit: Min assist       General bed mobility comments: min A for trunk    Transfers Overall transfer level: Needs assistance Equipment used: Rolling walker (2 wheeled) Transfers: Sit to/from [facility] Sit to Stand: Max assist;Mod  assist;From elevated surface Stand pivot transfers: Total assist       General transfer comment: first to upright walker with difficulty getting hips under him so returned to sit, then 2 trials to regular RW and pt needing to sit after standing briefly again with dfficulty getting knees extended and R UE weakness and R LE buckling, stood x 5 to [place] and then transferred to chair able to maintain standing about 12 sec  Ambulation/Gait             General Gait Details: unable this session   Stairs             Wheelchair Mobility    Modified Rankin (Stroke Patients Only)       Balance Overall balance assessment: Needs assistance Sitting-balance support: Feet supported Sitting balance-Leahy Scale: Fair     Standing balance support: Bilateral upper extremity supported Standing balance-Leahy Scale: Poor Standing balance comment: reliant on UE and external support                            Cognition Arousal/Alertness: Awake/alert Behavior During Therapy: WFL for tasks assessed/performed Overall Cognitive Status: Within Functional Limits for tasks assessed                                        Exercises      General Comments        Pertinent Vitals/Pain Pain  Assessment: Faces Faces Pain Scale: Hurts even more Pain Location: neck and shoulders Pain Descriptors / Indicators: Grimacing;Guarding;Aching Pain Intervention(s): Monitored during session;Repositioned;Premedicated before session    Home Living                      Prior Function            PT Goals (current goals can now be found in the care plan section) Progress towards PT goals: Not progressing toward goals - comment    Frequency    Min 4X/week      PT Plan Current plan remains appropriate    Co-evaluation              AM-PAC PT "6 Clicks" Mobility   Outcome Measure  Help needed turning from your back to your side while in a flat bed  without using bedrails?: A Little Help needed moving from lying on your back to sitting on the side of a flat bed without using bedrails?: A Little Help needed moving to and from a bed to a chair (including a wheelchair)?: Total Help needed standing up from a chair using your arms (e.g., wheelchair or bedside chair)?: A Lot Help needed to walk in hospital room?: Total Help needed climbing 3-5 steps with a railing? : Total 6 Click Score: 11    End of Session Equipment Utilized During Treatment: Gait belt;Cervical collar Activity Tolerance: Patient tolerated treatment well Patient left: in chair;with family/visitor present;with call bell/phone within reach;with chair alarm set   PT Visit Diagnosis: Other abnormalities of gait and mobility (R26.89);Ataxic gait (R26.0);Muscle weakness (generalized) (M62.81);Other symptoms and signs involving the nervous system (R29.898);Pain Pain - part of body: Shoulder     Time: 0737-1062 PT Time Calculation (min) (ACUTE ONLY): 32 min  Charges:  $Therapeutic Activity: 23-37 mins                     Magda Kiel, PT Acute Rehabilitation Services IRSWN:[phone] [phone] 07/28/2021    Reginia Naas 07/28/2021, 5:31 PM

## 2021-07-28 NOTE — Progress Notes (Signed)
Subjective: Patient reports upper back/shoulder discomfort and stated that he thinks he "slept on it weird last night." He continues to slowly male progress. No acute events overnight.   Objective: Vital signs in last 24 hours: Temp:  [97.7 F (36.5 C)-98.1 F (36.7 C)] 98.1 F (36.7 C) (09/28 0814) Pulse Rate:  [63-72] 70 (09/28 0814) Resp:  [16-19] 18 (09/28 0814) BP: (115-141)/(61-78) 137/77 (09/28 0814) SpO2:  [95 %-99 %] 95 % (09/28 0814)  Intake/Output from previous day: 09/27 0701 - 09/28 0700 In: -  Out: 1100 [Urine:1100] Intake/Output this shift: No intake/output data recorded.  Physical Exam: Patient is awake, A/O X 4, conversant, and in good spirits. They are in NAD and VSS. Doing well. Speech is dysphonic, but fluent and appropriate. Some mild dysphagia. MAEW. Sensation is improving. PERLA, EOMI. CNs grossly intact. Dressing is clean dry intact. Incision is well approximated with no drainage, erythema, or edema.   Lab Results: No results for input(s): WBC, HGB, HCT, PLT in the last 72 hours. BMET No results for input(s): NA, K, CL, CO2, GLUCOSE, BUN, CREATININE, CALCIUM in the last 72 hours.  Studies/Results: No results found.  Assessment/Plan: 70 year old male who is s/p C3/4 ACDF by [name] on 07/19/2021 and s/p revision of his C3/4 ACDF and evacuation of his epidural hematoma by [name] on 07/22/2021. The patient is continuing to make slow progress. He has weakness and numbness in his BUE and BLE that is slowly improving, left greater than right. Swallowing continues to improved. Awaiting CIR placement and insurance approval. Continue PT/OT. Continue working on pain control and encouraging mobility.  LOS: 6 days     Marvis Moeller, DNP, NP-C 07/28/2021, 9:37 AM

## 2021-07-29 NOTE — Progress Notes (Signed)
Physical Therapy Treatment Patient Details Name: [name] MRN: [id number] DOB: 1951-01-20 Today's Date: 07/29/2021   History of Present Illness Pt is a 70 y/o male who is s/p C3/4 ACDF by [name] on 07/19/2021 and discharged home in stable condition on 07/20/2021. Pt presented to ED on 9/22 due to acute onset of shooting pain, B LE weakness, R UE weakness and inability to ambulate. Pt noted with epidural hematoma and underwent ACDF revision at C3-4 and evacuation of hematoma on 9/22. PMH: carpal tunnel syndrome, CHF, CAD, DDD, GERD, HTN, MI, CABG x 3 (2019), hx of back surgery (2015)    PT Comments    Patient with improved standing tolerance and able to get legs extended with 2 person assist for managing Upper body and hand on knee to ensure R knee extension.  Patient with poor proprioceptive awareness so did not realize legs were flexed initially.  Encouraged he took couple of steps today.  Feel he continues to need intensive skilled rehab on CIR prior to d/c home.  PT to continue to follow.     Recommendations for follow up therapy are one component of a multi-disciplinary discharge planning process, led by the attending physician.  Recommendations may be updated based on patient status, additional functional criteria and insurance authorization.  Follow Up Recommendations  CIR     Equipment Recommendations  None recommended by PT    Recommendations for Other Services       Precautions / Restrictions Precautions Precautions: Fall;Cervical Required Braces or Orthoses: Cervical Brace Cervical Brace: Soft collar;For comfort     Mobility  Bed Mobility               General bed mobility comments: up in chair following bathing with nursing    Transfers   Equipment used: 4-wheeled walker;Bilateral platform walker (EVA walker) Transfers: Sit to/from Stand Sit to Stand: +2 physical assistance;Mod assist            Ambulation/Gait Ambulation/Gait assistance: Mod  assist;+2 physical assistance Gait Distance (Feet): 2 Feet Assistive device: 4-wheeled walker;Bilateral platform walker (Eva walker) Gait Pattern/deviations: Step-to pattern;Ataxic;Scissoring;Narrow base of support     General Gait Details: able to take one step forward and one step back during second stand to eva walker, then took two steps forward and two back during third stand, initially unable to step due to bilateral knee buckling and pt unaware   Stairs             Wheelchair Mobility    Modified Rankin (Stroke Patients Only)       Balance Overall balance assessment: Needs assistance Sitting-balance support: Feet supported Sitting balance-Leahy Scale: Fair Sitting balance - Comments: weak trunk with poor postural control. but sits with S no UE support   Standing balance support: Bilateral upper extremity supported Standing balance-Leahy Scale: Poor Standing balance comment: reliant on UE and external support                            Cognition Arousal/Alertness: Awake/alert Behavior During Therapy: WFL for tasks assessed/performed Overall Cognitive Status: Within Functional Limits for tasks assessed                                        Exercises      General Comments General comments (skin integrity, edema, etc.): daughter present, reports difficulty feeding  himself, so provided red tubing for grip build on utensils and cup with handle      Pertinent Vitals/Pain Pain Score: 7  Pain Location: neck and shoulders Pain Descriptors / Indicators: Grimacing;Guarding;Aching Pain Intervention(s): Monitored during session;Repositioned;Heat applied    Home Living                      Prior Function            PT Goals (current goals can now be found in the care plan section) Progress towards PT goals: Progressing toward goals    Frequency    Min 4X/week      PT Plan Current plan remains appropriate     Co-evaluation              AM-PAC PT "6 Clicks" Mobility   Outcome Measure  Help needed turning from your back to your side while in a flat bed without using bedrails?: A Little Help needed moving from lying on your back to sitting on the side of a flat bed without using bedrails?: A Little Help needed moving to and from a bed to a chair (including a wheelchair)?: A Lot Help needed standing up from a chair using your arms (e.g., wheelchair or bedside chair)?: Total Help needed to walk in hospital room?: Total Help needed climbing 3-5 steps with a railing? : Total 6 Click Score: 11    End of Session Equipment Utilized During Treatment: Gait belt;Cervical collar Activity Tolerance: Patient limited by fatigue Patient left: in chair;with call bell/phone within reach;with chair alarm set;with family/visitor present   PT Visit Diagnosis: Other abnormalities of gait and mobility (R26.89);Ataxic gait (R26.0);Muscle weakness (generalized) (M62.81);Other symptoms and signs involving the nervous system (R29.898);Pain Pain - Right/Left: Left Pain - part of body: Shoulder     Time: 8144-8185 PT Time Calculation (min) (ACUTE ONLY): 32 min  Charges:  $Therapeutic Activity: 23-37 mins                     Magda Kiel, PT Acute Rehabilitation Services UDJSH:[phone] [phone] 07/29/2021    Reginia Naas 07/29/2021, 10:54 AM

## 2021-07-29 NOTE — Progress Notes (Signed)
Subjective: Patient reports doing well, no acute changes over night.   Objective: Vital signs in last 24 hours: Temp:  [97.6 F (36.4 C)-98.2 F (36.8 C)] 98.1 F (36.7 C) (09/29 0744) Pulse Rate:  [63-80] 76 (09/29 0744) Resp:  [16-19] 18 (09/29 0744) BP: (108-154)/(64-96) 140/72 (09/29 0744) SpO2:  [95 %-100 %] 95 % (09/29 0744)  Intake/Output from previous day: 09/28 0701 - 09/29 0700 In: 840 [P.O.:840] Out: 1000 [Urine:1000] Intake/Output this shift: No intake/output data recorded.  Neurologically unchanged  Lab Results: Lab Results  Component Value Date   WBC 6.5 07/22/2021   HGB 12.9 (L) 07/22/2021   HCT 38.0 (L) 07/22/2021   MCV 96.1 07/22/2021   PLT 108 (L) 07/22/2021   Lab Results  Component Value Date   INR 1.0 07/22/2021   BMET Lab Results  Component Value Date   NA 136 07/22/2021   K 3.9 07/22/2021   CL 99 07/22/2021   CO2 28 07/22/2021   GLUCOSE 102 (H) 07/22/2021   BUN 23 07/22/2021   CREATININE 1.40 (H) 07/22/2021   CALCIUM 9.2 07/22/2021    Studies/Results: No results found.  Assessment/Plan: S/p evacuation of cervical epidural hematoma. Awaiting CIR placement. Continue therapies.   LOS: 7 days    [name] [facility] 07/29/2021, 7:56 AM

## 2021-07-30 NOTE — H&P (Signed)
Physical Medicine and Rehabilitation Admission H&P    Chief Complaint  Patient presents with   Extremity Weakness  :nontraumatic myelopathy due to epidural hematoma   HPI: [name] is a 70 year old right-handed male with history significant for ITP, hypertension, CAD with CABG 2019, hyperlipidemia.  Per chart review patient lives with spouse.  1 level home 3 steps to entry.  Independent with assistive device.  Patient with recent admission for C3/4 ACDF by 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  He was discharged home 07/20/2021 ambulating 400 feet supervision without assistive device.  Presented 07/22/2021 with numbness tingling, lightening sensation and weakness going down his arms and legs while taking a shower.  Work-up and imaging showed epidural hematoma at C3-4 with cord compression.  Underwent revision arthrodesis C3-4 anterior interbody technique evacuation of epidural hematoma placement of intervertebral biomechanical device C3-4 07/22/2021 per [name].  Patient continues with cervical brace.  Tolerating a regular consistency diet.  Therapy evaluations completed due to patient decreased functional mobility was admitted for a comprehensive rehab program.   Pt reports having UE and LE weakness since developed epidural hematoma.  LBM 3 days ago-decreased appeitite and feeling full.   also having urinary retention- and requiring a foley att his time.  On Floamx 0.8 mg nightly.  Having a lot of nerve pain- stinging- most of time pain OK, but sometimes, acutely worse- wondering if can have gabapentin for it.     Review of Systems  Constitutional:  Negative for chills and fever.  HENT:  Negative for hearing loss.   Eyes:  Negative for blurred vision and double vision.  Respiratory:  Negative for cough and shortness of breath.   Cardiovascular:  Negative for chest pain, palpitations and leg swelling.  Gastrointestinal:   Positive for constipation. Negative for heartburn, nausea and vomiting.       GERD  Genitourinary:  Negative for dysuria, flank pain and hematuria.  Musculoskeletal:  Positive for back pain, joint pain and myalgias.  Skin:  Negative for rash.  Neurological:  Positive for weakness.       Numbness and tingling sensation going down both arms and legs  Psychiatric/Behavioral:  Positive for depression. The patient has insomnia.   All other systems reviewed and are negative. Past Medical History:  Diagnosis Date   Allergy    Carpal tunnel syndrome    Chronic back pain    Chronic combined systolic and diastolic congestive heart failure (HCC)    Chronic ITP (idiopathic thrombocytopenia) (HCC) 05/25/2015   Coronary artery disease    Coronary artery disease involving native coronary artery of native heart with angina pectoris (HCC)    Degenerative disc disease    GERD (gastroesophageal reflux disease)    History of thrombocytopenia    Hypercholesteremia    Hypertension    Hypothyroid    Lumbar pain    Myocardial infarction ([place]) 2009   Obesity    Pneumonia    around age 39   S/P CABG x 3 04/27/2018   LIMA to LAD SVG to OM1 SVG to OM2   Shortness of breath dyspnea    Past Surgical History:  Procedure Laterality Date   ANTERIOR CERVICAL DECOMP/DISCECTOMY FUSION N/A 07/19/2021   Procedure: CERVICAL THREE-FOUR ANTERIOR CERVICAL DECOMPRESSION/DISCECTOMY FUSION;  Surgeon: [name], MD;  Location: [facility];  Service: Neurosurgery;  Laterality: N/A;   ANTERIOR CERVICAL DECOMP/DISCECTOMY FUSION N/A 07/22/2021   Procedure: ANTERIOR CERVICAL DECOMPRESSION/DISCECTOMY REVISON FUSION  WITH EVACUATION  OF EPIDURAL HEMATOMA;  Surgeon: Dawley, Theodoro Doing, DO;  Location: [facility];  Service: Neurosurgery;  Laterality: N/A;   BACK SURGERY     5 lumbas disc with cervical and lumbar fusions   BIOPSY N/A 03/06/2014   Procedure: ESOPHAGEAL BIOPSIES;  Surgeon: [name], MD;  Location: [facility];  Service: Endoscopy;   Laterality: N/A;   COLONOSCOPY     COLONOSCOPY WITH PROPOFOL N/A 03/06/2014   Procedure: COLONOSCOPY WITH PROPOFOL;  Surgeon: [name], MD;  Location: [facility];  Service: Endoscopy;  Laterality: N/A;  in cecum at 0807; total withdrawal time 9 minutes   CORONARY ANGIOPLASTY WITH STENT PLACEMENT     2000, and 2004 has 3 stents   CORONARY ARTERY BYPASS GRAFT N/A 04/27/2018   Procedure: CORONARY ARTERY BYPASS GRAFTING (CABG) x Three , using left internal mammary artery and right leg greater saphenous vein;  Surgeon: [name], MD;  Location: [facility];  Service: Open Heart Surgery;  Laterality: N/A;   ESOPHAGOGASTRODUODENOSCOPY (EGD) WITH PROPOFOL N/A 03/06/2014   Procedure: ESOPHAGOGASTRODUODENOSCOPY (EGD) WITH PROPOFOL;  Surgeon: [name], MD;  Location: [facility];  Service: Endoscopy;  Laterality: N/A;   LEFT HEART CATH AND CORONARY ANGIOGRAPHY N/A 04/24/2018   Procedure: LEFT HEART CATH AND CORONARY ANGIOGRAPHY;  Surgeon: [name], MD;  Location: [facility];  Service: Cardiovascular;  Laterality: N/A;   LUMBAR FUSION  2009   MALONEY DILATION N/A 03/06/2014   Procedure: MALONEY DILATION 54 french;  Surgeon: [name], MD;  Location: [facility];  Service: Endoscopy;  Laterality: N/A;   NECK SURGERY     TEE WITHOUT CARDIOVERSION N/A 04/27/2018   Procedure: TRANSESOPHAGEAL ECHOCARDIOGRAM (TEE);  Surgeon: [name], MD;  Location: [facility];  Service: Open Heart Surgery;  Laterality: N/A;   Family History  Problem Relation Age of Onset   Heart attack Father    Heart disease Father    Colon cancer Maternal Grandmother    Esophageal cancer Neg Hx    Rectal cancer Neg Hx    Stomach cancer Neg Hx    Social History:  reports that he quit smoking about 30 years ago. His smoking use included cigarettes. He has a 40.00 pack-year smoking history. He has never used smokeless tobacco. He reports current alcohol use. He reports that he does not use drugs. Allergies:   Allergies  Allergen Reactions   Penicillins Other (See Comments)    SYNCOPAL EPISODE  Has patient had a PCN reaction causing immediate rash, facial/tongue/throat swelling, SOB or LIGHTHEADEDNESS with HYPOTENSION [SYNCOPE] #  #  #  YES  #  #  #  Has patient had a PCN reaction causing severe rash involving mucus membranes or skin necrosis:No Has patient had a PCN reaction that required hospitalization:No Has patient had a PCN reaction occurring within the last 10 years:Yes    Sulfa Antibiotics Swelling    SWELLING REACTION UNSPECIFIED > PER PREVIOUS PMH   Zoloft [Sertraline Hcl] Other (See Comments)    Confusion    Medications Prior to Admission  Medication Sig Dispense Refill   albuterol (PROVENTIL HFA;VENTOLIN HFA) 108 (90 Base) MCG/ACT inhaler Inhale 2 puffs into the lungs every 6 (six) hours as needed for wheezing or shortness of breath. 1 Inhaler 0   aspirin EC 81 MG EC tablet Take 1 tablet (81 mg total) by mouth daily.     citalopram (CELEXA) 20 MG tablet Take 1 tablet (20 mg total) by mouth every morning.  90 tablet 1   cyclobenzaprine (FLEXERIL) 10 MG tablet Take 1 tablet (10 mg total) by mouth 3 (three) times daily as needed for muscle spasms. 30 tablet 0   levothyroxine (SYNTHROID) 150 MCG tablet TAKE 1 TABLET BY MOUTH EVERY DAY (Patient taking differently: Take 150 mcg by mouth daily.) 90 tablet 1   lisinopril (ZESTRIL) 10 MG tablet Take 1 tablet (10 mg total) by mouth daily. 90 tablet 1   metoprolol tartrate (LOPRESSOR) 50 MG tablet Take 1 tablet (50 mg total) by mouth 2 (two) times daily. 180 tablet 1   Multiple Vitamin (MULTIVITAMIN) capsule Take 1 capsule by mouth daily.       oxyCODONE 10 MG TABS Take 1 tablet (10 mg total) by mouth every 3 (three) hours as needed for severe pain ((score 7 to 10)). 30 tablet 0   pantoprazole (PROTONIX) 40 MG tablet TAKE 1 TABLET(40 MG) BY MOUTH TWICE DAILY (Patient taking differently: Take 40 mg by mouth daily.) 180 tablet 1   potassium  chloride (KLOR-CON) 10 MEQ tablet TAKE 1 TABLET(10 MEQ) BY MOUTH DAILY (Patient taking differently: Take 10 mEq by mouth daily.) 90 tablet 0   rosuvastatin (CRESTOR) 40 MG tablet TAKE 1 TABLET(40 MG) BY MOUTH EVERY EVENING (Patient taking differently: Take 40 mg by mouth daily.) 90 tablet 0   tamsulosin (FLOMAX) 0.4 MG CAPS capsule TAKE 2 CAPSULES BY MOUTH AT NIGHT (Patient taking differently: Take 0.8 mg by mouth at bedtime.) 180 capsule 0   torsemide (DEMADEX) 20 MG tablet TAKE 2 TABLETS(40 MG) BY MOUTH DAILY (Patient taking differently: Take 20 mg by mouth 2 (two) times daily.) 180 tablet 0   alendronate (FOSAMAX) 70 MG tablet Take 1 tablet (70 mg total) by mouth every 7 (seven) days. Take with a full glass of water on an empty stomach. (Patient not taking: Reported on 07/22/2021) 4 tablet 11   nitroGLYCERIN (NITROSTAT) 0.4 MG SL tablet PLACE 1 TABLET UNDER THE TONGUE EVERY 5 MINUTES AS NEEDED FOR CHEST PAINS. (Patient taking differently: Place 0.4 mg under the tongue every 5 (five) minutes as needed for chest pain.) 25 tablet 3   oxyCODONE-acetaminophen (PERCOCET/ROXICET) 5-325 MG tablet Take 1 tablet by mouth every 4 (four) hours as needed for moderate pain. (Patient not taking: No sig reported) 30 tablet 0    Drug Regimen Review Drug regimen was reviewed and remains appropriate with no significant issues identified  Home: Home Living Family/patient expects to be discharged to:: Private residence Living Arrangements: Spouse/significant other Available Help at Discharge: Family, Available 24 hours/day Type of Home: House Home Access: Stairs to enter [employer] of Steps: 3 Entrance Stairs-Rails: Right, Left Home Layout: One level Bathroom Shower/Tub: Multimedia programmer: Handicapped height Home Equipment: Environmental consultant - 2 wheels, Environmental consultant - 4 wheels, Shower seat - built in, [employer] held shower head, Grab bars - tub/shower  Lives With: Spouse   Functional History: Prior  Function Level of Independence: Independent with assistive device(s) Comments: usually would fall from initial standing, used rollator for longer distances, RW in the home  Functional Status:  Mobility: Bed Mobility Overal bed mobility: Needs Assistance Bed Mobility: Rolling, Sidelying to Sit Rolling: Supervision Sidelying to sit: Min assist Sit to supine: Mod assist Sit to sidelying: Min assist General bed mobility comments: up in chair following bathing with nursing Transfers Overall transfer level: Needs assistance Equipment used: 4-wheeled walker, Bilateral platform walker (EVA walker) Transfers: Sit to/from Stand Sit to Stand: +2 physical assistance, Mod assist Stand pivot transfers:  Total assist General transfer comment: first to upright walker with difficulty getting hips under him so returned to sit, then 2 trials to regular RW and pt needing to sit after standing briefly again with dfficulty getting knees extended and R UE weakness and R LE buckling, stood x 5 to [facility] and then transferred to chair able to maintain standing about 12 sec Ambulation/Gait Ambulation/Gait assistance: Mod assist, +2 physical assistance Gait Distance (Feet): 2 Feet Assistive device: 4-wheeled walker, Bilateral platform walker (Eva walker) Gait Pattern/deviations: Step-to pattern, Ataxic, Scissoring, Narrow base of support General Gait Details: able to take one step forward and one step back during second stand to eva walker, then took two steps forward and two back during third stand, initially unable to step due to bilateral knee buckling and pt unaware Gait velocity: decreased Gait velocity interpretation: <1.31 ft/sec, indicative of household ambulator    ADL: ADL Overall ADL's : Needs assistance/impaired Eating/Feeding: Minimal assistance, Sitting Eating/Feeding Details (indicate cue type and reason): Pt has been eating mainly with LUE, he is getting good movement back in RUE but decreased  coordination. Encouraged him to order at least one finger food item wtih each meal so he can eat with is right hand more (don't really want him using utensils as of yet due to hitting/poking himself with them). Asked his wife to bring in a "sippy cup"--one that won't spill so he can try and drink with that hand as well. Grooming: Minimal assistance, Sitting Upper Body Bathing: Minimal assistance, Sitting Lower Body Bathing: Moderate assistance, Sitting/lateral leans, Sit to/from stand Upper Body Dressing : Minimal assistance, Sitting Lower Body Dressing: Maximal assistance, Sit to/from stand Toileting- Clothing Manipulation and Hygiene: Maximal assistance, Sitting/lateral lean, Sit to/from stand General ADL Comments: Pt with significant deficits in coordination, R UE strength, BLE strength and balance with inability to ambulate or complete ADLs without increased physical assist  Cognition: Cognition Overall Cognitive Status: Within Functional Limits for tasks assessed Orientation Level: (P) Oriented X4 Cognition Arousal/Alertness: Awake/alert Behavior During Therapy: WFL for tasks assessed/performed Overall Cognitive Status: Within Functional Limits for tasks assessed  Physical Exam: Blood pressure (!) 163/78, pulse 72, temperature (!) 97.5 F (36.4 C), temperature source Oral, resp. rate 18, height 5\' 11"  (1.803 m), weight 105.2 kg, SpO2 98 %. Physical Exam Vitals and nursing note reviewed. Exam conducted with a chaperone present.  Constitutional:      Comments: Sitting up in bed; appropriate, brother in law in room; c/o fullness, NAD  HENT:     Head: Normocephalic and atraumatic.     Right Ear: External ear normal.     Left Ear: External ear normal.     Nose: Nose normal. No congestion.     Mouth/Throat:     Mouth: Mucous membranes are dry.     Pharynx: No oropharyngeal exudate.  Eyes:     General:        Right eye: No discharge.        Left eye: No discharge.     Extraocular  Movements: Extraocular movements intact.  Neck:     Comments: C3/4 ACDF and s/p epidural hematoma evacuation- incision looks OK Cardiovascular:     Rate and Rhythm: Normal rate and regular rhythm.     Heart sounds: Normal heart sounds. No murmur heard.   No gallop.  Pulmonary:     Comments: CTA B/L- no W/R/R- good air movement Abdominal:     Comments: Soft, but distended; hypoactive; NT   Genitourinary:  Comments: Foley in place- amber urine Musculoskeletal:     Comments: RUE- biceps 4/5, Triceps 4/5, WE 4-/5, grip 3/5, FA 2/5 LUE 4+/5 in same muscles RLE- HF 3-/5, KE/DF and PF 4/5 LLE- 4+/5 in same muscles   Skin:    General: Skin is warm and dry.     Comments: Surgical site clean and dry.  Neurological:     Comments: Patient is alert.  Speech is dysphonic but intelligible.  No acute distress and follows commands.  Oriented to person place and time.  Decreased to light touch in pinprick from C4 to S5- checked all levels/dermatomes and worse in L1-S1 on L  Psychiatric:        Behavior: Behavior normal.     Comments: Sad; flat affect    No results found for this or any previous visit (from the past 48 hour(s)). No results found.     Medical Problem List and Plan: 1.  Cervical myelopathy secondary to epidural hematoma status post epidural hematoma 07/22/2021 after ACDF 07/19/2021.  Cervical collar as directed  -patient may  shower if dressing covered  -ELOS/Goals: 10-12 days min A to supervision 2.  Antithrombotics: -DVT/anticoagulation:  Mechanical: Sequential compression devices, below knee Bilateral lower extremities.  Check vascular study  -antiplatelet therapy: N/A 3. Pain Management: Robaxin/oxycodone as needed -will add Gabapentin 300 mg BID for now.  4. Mood: Celexa 20 mg daily  -antipsychotic agents: N/A 5. Neuropsych: This patient is capable of making decisions on his own behalf. 6. Skin/Wound Care: Routine skin checks 7. Fluids/Electrolytes/Nutrition:  Routine in and outs with follow-up chemistries 8.  Hypertension.  Lopressor 50 mg twice daily, lisinopril 10 mg daily, Demadex 20 mg twice daily.  Monitor with increased mobility 9.  BPH with urinary retention,likely neurogenic bladder.  Flomax 0.8 mg daily.  Keep foley for 1-2 days, then try to remove and check PVRs.  10.  Hyperlipidemia.  Crestor 11.  Hypothyroidism.  Synthroid 12.  CAD with CABG 2019.  Aspirin currently on hold 13.  Constipation/neurogenic bowel. Will change to Senokot and give Sorbitol 60cc x1 to get him to go.  14.  GERD.  Protonix   I have personally performed a face to face diagnostic evaluation of this patient and formulated the key components of the plan.  Additionally, I have personally reviewed laboratory data, imaging studies, as well as relevant notes and concur with the physician assistant's documentation above.   The patient's status has not changed from the original H&P.  Any changes in documentation from the acute care chart have been noted above.    Lavon Paganini Angiulli, PA-C 07/30/2021

## 2021-07-30 NOTE — Progress Notes (Signed)
Occupational Therapy Treatment Patient Details Name: [name] MRN: [id number] DOB: Jul 07, 1951 Today's Date: 07/30/2021   History of present illness Pt is a 70 y/o male who is s/p C3/4 ACDF by [name] on 07/19/2021 and discharged home in stable condition on 07/20/2021. Pt presented to ED on 9/22 due to acute onset of shooting pain, B LE weakness, R UE weakness and inability to ambulate. Pt noted with epidural hematoma and underwent ACDF revision at C3-4 and evacuation of hematoma on 9/22. PMH: carpal tunnel syndrome, CHF, CAD, DDD, GERD, HTN, MI, CABG x 3 (2019), hx of back surgery (2015)   OT comments  Pt making steady progress toward goals. Participated in ADL retraining and neuromuscular rehab due to significant weakness and apparent neurapraxia. Able to stand and weight shift with mod - Max A at times x 2 using [name] walker. Continue to recommend CIR for intensive rehab. Will follow acutely.   Recommendations for follow up therapy are one component of a multi-disciplinary discharge planning process, led by the attending physician.  Recommendations may be updated based on patient status, additional functional criteria and insurance authorization.    Follow Up Recommendations  CIR    Equipment Recommendations  3 in 1 bedside commode;Wheelchair (measurements OT);Wheelchair cushion (measurements OT);Tub/shower bench    Recommendations for Other Services Rehab consult    Precautions / Restrictions Precautions Precautions: Fall;Cervical Required Braces or Orthoses: Cervical Brace Cervical Brace: Soft collar;For comfort       Mobility Bed Mobility               General bed mobility comments: OOB in chair    Transfers Overall transfer level: Needs assistance Equipment used:  [name] walker +2  Max A) Transfers: Sit to/from Stand Sit to Stand: +2 physical assistance;Max assist         General transfer comment: Apparent neurapraxia; RLE wrose than LLE; L knee buckles a ttimes  with weight shifting    Balance     Sitting balance-Leahy Scale: Poor       Standing balance-Leahy Scale: Zero                             ADL either performed or assessed with clinical judgement   ADL Overall ADL's : Needs assistance/impaired Eating/Feeding: Minimal assistance;Sitting Eating/Feeding Details (indicate cue type and reason): has red tubing for utnesils; lidded cup with handles; dtr has ordered large handled weightedutensils Grooming: Moderate assistance   Upper Body Bathing: Minimal assistance;Sitting   Lower Body Bathing: Moderate assistance;Sit to/from stand   Upper Body Dressing : Moderate assistance   Lower Body Dressing: Maximal assistance;Sit to/from stand Lower Body Dressing Details (indicate cue type and reason): Able to complete figure four position with physical assistance however difficulty maintaining due to poor motor control     Toileting- Clothing Manipulation and Hygiene: Maximal assistance       Functional mobility during ADLs: Maximal assistance;+2 for physical assistance General ADL Comments: Would most likley benefit fomr long handled sponge     Vision       Perception     Praxis      Cognition Arousal/Alertness: Awake/alert Behavior During Therapy: WFL for tasks assessed/performed (although requires repeated instructions for correct placement of hands and follow through with transfers) Overall Cognitive Status: Within Functional Limits for tasks assessed  Exercises Exercises: Other exercises;General Upper Extremity General Exercises - Upper Extremity Shoulder Flexion: AAROM;AROM;Both;15 reps;Seated (to 90) Elbow Flexion: AROM;AAROM;Right;15 reps;Seated (cues to decrease scap elevation) Elbow Extension: AAROM;AROM;Right;15 reps;Seated Wrist Flexion: AAROM;AROM;Right;15 reps Wrist Extension: AROM;AAROM;Right;15 reps;Seated Digit Composite Flexion:  AROM;Right;15 reps;Squeeze ball (theraputty) Composite Extension: AROM;Right;10 reps Other Exercises Other Exercises: rythmic stabilization of trunk Other Exercises: assisted table supported exercises in shoulder FF; scap protraction in seated position Other Exercises: focus on controlled slow movements using visual targets   Shoulder Instructions       General Comments Difficulty achieveing and maintaining upright postural control; especially in stanidng    Pertinent Vitals/ Pain       Pain Assessment: Faces Faces Pain Scale: Hurts even more Pain Location: neck and shoulders Pain Descriptors / Indicators: Grimacing;Guarding;Aching Pain Intervention(s): Limited activity within patient's tolerance  Home Living                                          Prior Functioning/Environment              Frequency  Min 2X/week        Progress Toward Goals  OT Goals(current goals can now be found in the care plan section)  Progress towards OT goals: Progressing toward goals  Acute Rehab OT Goals Patient Stated Goal: do whatever it takes to get better and walk again OT Goal Formulation: With patient Time For Goal Achievement: 08/06/21 Potential to Achieve Goals: Good ADL Goals Pt Will Perform Grooming: with min guard assist;standing Pt Will Perform Lower Body Bathing: sit to/from stand;with min guard assist;with adaptive equipment Pt Will Perform Lower Body Dressing: with min guard assist;sit to/from stand;with adaptive equipment Pt Will Transfer to Toilet: with min guard assist;ambulating Pt/caregiver will Perform Home Exercise Program: Increased strength;Right Upper extremity;With theraputty;Independently;With written HEP provided  Plan Discharge plan remains appropriate    Co-evaluation    PT/OT/SLP Co-Evaluation/Treatment: Yes Reason for Co-Treatment: Complexity of the patient's impairments (multi-system involvement);For patient/therapist safety;To  address functional/ADL transfers   OT goals addressed during session: ADL's and self-care;Strengthening/ROM      AM-PAC OT "6 Clicks" Daily Activity     Outcome Measure   Help from another person eating meals?: A Little Help from another person taking care of personal grooming?: A Lot Help from another person toileting, which includes using toliet, bedpan, or urinal?: A Lot Help from another person bathing (including washing, rinsing, drying)?: A Lot Help from another person to put on and taking off regular upper body clothing?: A Lot Help from another person to put on and taking off regular lower body clothing?: A Lot 6 Click Score: 13    End of Session Equipment Utilized During Treatment: Cervical collar (EVa walker)  OT Visit Diagnosis: Other abnormalities of gait and mobility (R26.89);Muscle weakness (generalized) (M62.81);Unsteadiness on feet (R26.81);Pain Pain - part of body: Shoulder (neck)   Activity Tolerance Patient tolerated treatment well   Patient Left in chair;with call bell/phone within reach;with chair alarm set;with family/visitor present   Nurse Communication Mobility status;Need for lift equipment        Time: 2836-6294 OT Time Calculation (min): 39 min  Charges: OT General Charges $OT Visit: 1 Visit OT Treatments $Self Care/Home Management : 8-22 mins $Neuromuscular Re-education: 8-22 mins  Maurie Boettcher, OT/L   Acute OT Clinical Specialist Acute Rehabilitation Services Pager [phone] Office [phone]   [facility] 07/30/2021, 12:01 PM

## 2021-07-30 NOTE — Progress Notes (Signed)
Physical Therapy Treatment Patient Details Name: [name] MRN: [id number] DOB: 03/22/1951 Today's Date: 07/30/2021   History of Present Illness Pt is a 70 y/o male who is s/p C3/4 ACDF by [name] on 07/19/2021 and discharged home in stable condition on 07/20/2021. Pt presented to ED on 9/22 due to acute onset of shooting pain, B LE weakness, R UE weakness and inability to ambulate. Pt noted with epidural hematoma and underwent ACDF revision at C3-4 and evacuation of hematoma on 9/22. PMH: carpal tunnel syndrome, CHF, CAD, DDD, GERD, HTN, MI, CABG x 3 (2019), hx of back surgery (2015)    PT Comments    Patient progressing slowly.  Still with apraxia/ataxia and decreased proprioceptive awareness of LE's in standing.  Feel adjuncts available such as mirror, weighting and other techniques available on CIR will assist him.  PT will continue to follow up if not d/c to rehab over weekend.     Recommendations for follow up therapy are one component of a multi-disciplinary discharge planning process, led by the attending physician.  Recommendations may be updated based on patient status, additional functional criteria and insurance authorization.  Follow Up Recommendations  CIR     Equipment Recommendations  None recommended by PT    Recommendations for Other Services       Precautions / Restrictions Precautions Precautions: Fall;Cervical Cervical Brace: Soft collar;For comfort     Mobility  Bed Mobility               General bed mobility comments: OOB in chair    Transfers Overall transfer level: Needs assistance Equipment used: 4-wheeled walker;Bilateral platform walker (EVA walker) Transfers: Sit to/from Stand Sit to Stand: +2 physical assistance;Max assist         General transfer comment: Apparent neurapraxia; RLE wrose than LLE; L knee buckles at times with weight shifting  Ambulation/Gait Ambulation/Gait assistance: Max assist;+2 physical assistance Gait  Distance (Feet): 2 Feet Assistive device: 4-wheeled walker;Bilateral platform walker (EVA walker) Gait Pattern/deviations: Step-to pattern;Ataxic;Scissoring;Narrow base of support     General Gait Details: took two steps with mod cues and assist at times for placement and knee extension; also performed pre-gait activities in standing lateral weight shifts with focus on upright posture, keeping placement of feet on the floor   Stairs             Wheelchair Mobility    Modified Rankin (Stroke Patients Only)       Balance Overall balance assessment: Needs assistance Sitting-balance support: Feet supported Sitting balance-Leahy Scale: Poor Sitting balance - Comments: weak trunk, poor postural control   Standing balance support: Bilateral upper extremity supported Standing balance-Leahy Scale: Zero Standing balance comment: reliant on UE and external support                            Cognition Arousal/Alertness: Awake/alert Behavior During Therapy: WFL for tasks assessed/performed Overall Cognitive Status: Within Functional Limits for tasks assessed                                        Exercises      General Comments        Pertinent Vitals/Pain Pain Assessment: Faces Faces Pain Scale: Hurts even more Pain Location: neck and shoulders Pain Descriptors / Indicators: Grimacing;Guarding;Aching Pain Intervention(s): Monitored during session;Repositioned;Limited activity within patient's tolerance  Home Living                      Prior Function            PT Goals (current goals can now be found in the care plan section) Progress towards PT goals: Progressing toward goals    Frequency    Min 4X/week      PT Plan Current plan remains appropriate    Co-evaluation PT/OT/SLP Co-Evaluation/Treatment: Yes Reason for Co-Treatment: For patient/therapist safety;To address functional/ADL transfers          AM-PAC  PT "6 Clicks" Mobility   Outcome Measure  Help needed turning from your back to your side while in a flat bed without using bedrails?: A Lot Help needed moving from lying on your back to sitting on the side of a flat bed without using bedrails?: A Lot Help needed moving to and from a bed to a chair (including a wheelchair)?: Total Help needed standing up from a chair using your arms (e.g., wheelchair or bedside chair)?: Total Help needed to walk in hospital room?: Total Help needed climbing 3-5 steps with a railing? : Total 6 Click Score: 8    End of Session Equipment Utilized During Treatment: Gait belt;Cervical collar Activity Tolerance: Patient limited by fatigue Patient left: in chair;with call bell/phone within reach;with chair alarm set   PT Visit Diagnosis: Other abnormalities of gait and mobility (R26.89);Ataxic gait (R26.0);Muscle weakness (generalized) (M62.81);Other symptoms and signs involving the nervous system (R29.898);Pain Pain - Right/Left:  (both) Pain - part of body: Shoulder     Time: 2297-9892 PT Time Calculation (min) (ACUTE ONLY): 27 min  Charges:  $Therapeutic Activity: 8-22 mins                     [name], PT Acute Rehabilitation Services JJHER:[phone] Office:[phone] 07/30/2021    [name] 07/30/2021, 3:56 PM

## 2021-07-30 NOTE — Discharge Summary (Addendum)
Physician Discharge Summary  Patient ID: [name] MRN: [id number] DOB/AGE: 70/04/1951 70 y.o.  Admit date: 07/22/2021 Discharge date: 08/01/2021  Admission Diagnoses: cervical epidural hematoma   Discharge Diagnoses: same   Discharged Condition: good  Hospital Course: The patient was admitted on 07/22/2021 and taken to the operating room where the patient underwent reexploration of cervical wound for evacuation of epidural hematoma. The patient tolerated the procedure well and was taken to the recovery room and then to the floor in stable condition. The hospital course was routine. There were no complications. The wound remained clean dry and intact. Pt had appropriate neck soreness. No complaints of arm pain or new N/T/W. The patient remained afebrile with stable vital signs, and tolerated a regular diet. The patient continued to increase activities, and pain was well controlled with oral pain medications.   Consults: None  Significant Diagnostic Studies:  Results for orders placed or performed during the hospital encounter of 07/22/21  Blood culture (routine x 2)   Specimen: BLOOD  Result Value Ref Range   Specimen Description BLOOD SITE NOT SPECIFIED    Special Requests      BOTTLES DRAWN AEROBIC AND ANAEROBIC Blood Culture adequate volume   Culture      NO GROWTH 5 DAYS Performed at [facility], [place] [address] [place], [place] [address]    Report Status 07/27/2021 FINAL   Blood culture (routine x 2)   Specimen: BLOOD  Result Value Ref Range   Specimen Description BLOOD SITE NOT SPECIFIED    Special Requests AEROBIC BOTTLE ONLY Blood Culture adequate volume    Culture      NO GROWTH 5 DAYS Performed at [facility], [place] [address], [place], [place] [address]    Report Status 07/27/2021 FINAL   Resp Panel by RT-PCR (Flu A&B, Covid) Nasopharyngeal Swab   Specimen: Nasopharyngeal Swab; Nasopharyngeal(NP) swabs in vial transport medium  Result Value Ref  Range   SARS Coronavirus 2 by RT PCR NEGATIVE NEGATIVE   Influenza A by PCR NEGATIVE NEGATIVE   Influenza B by PCR NEGATIVE NEGATIVE  Surgical PCR screen   Specimen: Nasal Mucosa; Nasal Swab  Result Value Ref Range   MRSA, PCR NEGATIVE NEGATIVE   Staphylococcus aureus NEGATIVE NEGATIVE  CBC with Differential/Platelet  Result Value Ref Range   WBC 6.5 4.0 - 10.5 K/uL   RBC 4.08 (L) 4.22 - 5.81 MIL/uL   Hemoglobin 12.3 (L) 13.0 - 17.0 g/dL   HCT 39.2 39.0 - 52.0 %   MCV 96.1 80.0 - 100.0 fL   MCH 30.1 26.0 - 34.0 pg   MCHC 31.4 30.0 - 36.0 g/dL   RDW 13.2 11.5 - 15.5 %   Platelets 108 (L) 150 - 400 K/uL   nRBC 0.0 0.0 - 0.2 %   Neutrophils Relative % 69 %   Neutro Abs 4.6 1.7 - 7.7 K/uL   Lymphocytes Relative 14 %   Lymphs Abs 0.9 0.7 - 4.0 K/uL   Monocytes Relative 10 %   Monocytes Absolute 0.6 0.1 - 1.0 K/uL   Eosinophils Relative 5 %   Eosinophils Absolute 0.3 0.0 - 0.5 K/uL   Basophils Relative 1 %   Basophils Absolute 0.0 0.0 - 0.1 K/uL   Immature Granulocytes 1 %   Abs Immature Granulocytes 0.03 0.00 - 0.07 K/uL  Comprehensive metabolic panel  Result Value Ref Range   Sodium 136 135 - 145 mmol/L   Potassium 4.1 3.5 - 5.1 mmol/L  Chloride 99 98 - 111 mmol/L   CO2 28 22 - 32 mmol/L   Glucose, Bld 109 (H) 70 - 99 mg/dL   BUN 23 8 - 23 mg/dL   Creatinine, Ser 1.45 (H) 0.61 - 1.24 mg/dL   Calcium 9.2 8.9 - 10.3 mg/dL   Total Protein 6.8 6.5 - 8.1 g/dL   Albumin 3.9 3.5 - 5.0 g/dL   AST 13 (L) 15 - 41 U/L   ALT 9 0 - 44 U/L   Alkaline Phosphatase 60 38 - 126 U/L   Total Bilirubin 0.7 0.3 - 1.2 mg/dL   GFR, Estimated 52 (L) >60 mL/min   Anion gap 9 5 - 15  Lactic acid, plasma  Result Value Ref Range   Lactic Acid, Venous 0.8 0.5 - 1.9 mmol/L  Protime-INR  Result Value Ref Range   Prothrombin Time 13.1 11.4 - 15.2 seconds   INR 1.0 0.8 - 1.2  APTT  Result Value Ref Range   aPTT 30 24 - 36 seconds  HIV Antibody (routine testing w rflx)  Result Value Ref Range    HIV Screen 4th Generation wRfx Non Reactive Non Reactive  CBG monitoring, ED  Result Value Ref Range   Glucose-Capillary 113 (H) 70 - 99 mg/dL   Comment 1 Notify RN    Comment 2 Document in Chart   I-stat chem 8, ED (not at [facility] or [facility])  Result Value Ref Range   Sodium 136 135 - 145 mmol/L   Potassium 3.9 3.5 - 5.1 mmol/L   Chloride 99 98 - 111 mmol/L   BUN 23 8 - 23 mg/dL   Creatinine, Ser 1.40 (H) 0.61 - 1.24 mg/dL   Glucose, Bld 102 (H) 70 - 99 mg/dL   Calcium, Ion 1.10 (L) 1.15 - 1.40 mmol/L   TCO2 27 22 - 32 mmol/L   Hemoglobin 12.9 (L) 13.0 - 17.0 g/dL   HCT 38.0 (L) 39.0 - 52.0 %  Type and screen [place]  Result Value Ref Range   ABO/RH(D) O POS    Antibody Screen NEG    Sample Expiration      07/25/2021,2359 Performed at [facility], [address], [place], [place] [address]   Prepare platelet pheresis  Result Value Ref Range   Unit Number [id number]    Blood Component Type PLTP2 PSORALEN TREATED    Unit division 00    Status of Unit REL FROM [facility]    Transfusion Status OK TO TRANSFUSE    Unit Number [id number]    Blood Component Type PLTP1 PSORALEN TREATED    Unit division 00    Status of Unit ISSUED,FINAL    Transfusion Status      OK TO TRANSFUSE Performed at [facility], [place] [address], [place], [place] [address]    Unit Number [id number]    Blood Component Type PLTP2 PSORALEN TREATED    Unit division 00    Status of Unit ISSUED,FINAL    Transfusion Status OK TO TRANSFUSE   BPAM Platelet Pheresis  Result Value Ref Range   ISSUE DATE / TIME [id number]    Blood Product Unit Number [id number]    PRODUCT CODE [id number]    Unit Type and Rh 8366    Blood Product Expiration Date 294765465035    ISSUE DATE / TIME 465681275170    Blood Product Unit Number [id number]    PRODUCT CODE [id number]    Unit Type and Rh 9357  Blood Product Expiration Date 161096045409    ISSUE DATE / TIME [id number]     Blood Product Unit Number [id number]    PRODUCT CODE [id number]    Unit Type and Rh 2440    Blood Product Expiration Date [id number]     DG Cervical Spine 1 View  Result Date: 07/19/2021 CLINICAL DATA:  C3-4 ACDF EXAM: DG CERVICAL SPINE - 1 VIEW COMPARISON:  06/22/2021 FINDINGS: Single C-arm image shows ACDF at C3-4. Components appear well positioned. No radiographically detectable complication. IMPRESSION: Single lateral view following ACDF C3-4. Electronically Signed   By: Nelson Chimes M.D.   On: 07/19/2021 13:37   DG Cervical Spine 2 or 3 views  Result Date: 07/22/2021 CLINICAL DATA:  Anterior cervical decompression and discectomy revision fusion with evacuation of epidural hematoma. EXAM: CERVICAL SPINE - 2-3 VIEW COMPARISON:  Preoperative CT earlier today FINDINGS: Two fluoroscopic spot views of the cervical spine obtained in the operating room in frontal and lateral projections. Anterior fusion hardware at C3-C4 with interbody spacer in place. Anterior fusion hardware C5 through C7, only partially included in the lateral field of view. Fluoroscopy time 9 seconds. Dose 1.65 mGy. IMPRESSION: Fluoroscopic spot views during cervical spine surgery. Electronically Signed   By: Keith Rake M.D.   On: 07/22/2021 17:04   CT HEAD WO CONTRAST (5MM)  Result Date: 07/22/2021 CLINICAL DATA:  70 year old male with postoperative day 3 status post cervical spine ACDF. New onset weakness, unable to stand. Spasm. EXAM: CT HEAD WITHOUT CONTRAST TECHNIQUE: Contiguous axial images were obtained from the base of the skull through the vertex without intravenous contrast. COMPARISON:  Orbit CT 04/29/2009. FINDINGS: Brain: Only mild generalized cerebral volume loss since 2010. No midline shift, ventriculomegaly, mass effect, evidence of mass lesion, intracranial hemorrhage or evidence of cortically based acute infarction. Gray-white matter differentiation is within normal limits throughout the brain. Stable mild  chronic dural calcification. No encephalomalacia identified. Vascular: Calcified atherosclerosis at the skull base. No suspicious intracranial vascular hyperdensity. Skull: No acute osseous abnormality identified. Sinuses/Orbits: Chronic left maxillary alveolar recess mucous retention cysts. Other Visualized paranasal sinuses and mastoids are clear. Other: Visualized orbits and scalp soft tissues are within normal limits. IMPRESSION: Negative for age non contrast CT appearance of the brain. Electronically Signed   By: Genevie Ann M.D.   On: 07/22/2021 04:55   CT Cervical Spine Wo Contrast  Result Date: 07/22/2021 CLINICAL DATA:  70 year old male with postoperative day 3 status post extension of chronic cervical spine fusion. New onset weakness, unable to stand. Spasm. EXAM: CT CERVICAL SPINE WITHOUT CONTRAST TECHNIQUE: Multidetector CT imaging of the cervical spine was performed without intravenous contrast. Multiplanar CT image reconstructions were also generated. COMPARISON:  Intraoperative images 07/19/21. Cervical spine MRI 06/07/2021. FINDINGS: Alignment: Stable alignment since the August MRI. Overall mild straightening of cervical lordosis. Cervicothoracic junction alignment is within normal limits. Bilateral posterior element alignment is within normal limits. Skull base and vertebrae: Postoperative details are below. Visualized skull base is intact. No atlanto-occipital dissociation. No cervical vertebral fracture identified. Soft tissues and spinal canal: Suggestion of increased density in the ventral spinal canal at C3-C4 opposite the new ACDF hardware (sagittal image 49 and series 6, image 47) which might be hematoma and may be causing persistent or increased spinal stenosis and cord mass effect at that level. Above and below that level no convincing spinal canal hematoma. Postoperative prevertebral soft tissue swelling, stranding, and small volume soft tissue gas is maximal at C3-C4. Mild gas and  stranding along the right anterior neck surgical approach between the right carotid space and thyroid. Disc levels: Advanced anterior C1-C2 degeneration with joint space loss and osteophytosis. C2-C3: Mild to moderate facet hypertrophy greater on the left. Mild endplate spurring. Stable left C3 foraminal stenosis since August. C3-C4: Interval ACDF. Hardware appears intact. Interbody spacer in place. Superimposed endplate spurring and moderate to severe right side facet hypertrophy at this level. Increased ventral spinal canal density on series 6, image 47 as detailed above. Bilateral C4 foraminal stenosis appears improved since August. C4-C5: Stable disc height. Chronic endplate spurring and moderate facet hypertrophy greater on the right. No convincing spinal stenosis at this level. C5 foraminal stenosis is stable. C5-C6: Prior ACDF. No hardware loosening, but appearance of chronic pseudoarthrosis at this level. C6-C7:  Prior ACDF with solid arthrodesis. C7-T1: Chronic disc space loss with circumferential endplate spurring and mild facet hypertrophy. No definite spinal stenosis at this level. Stable C8 foraminal stenosis since August. Upper chest: Chronic upper thoracic disc and endplate degeneration. Negative lung apices and visible superior mediastinum. Right lung azygous fissure, normal variant. Other: Cervicomedullary junction and visible posterior fossa appear negative. IMPRESSION: 1. Recent C3-C4 ACDF with intact hardware and expected prevertebral soft tissue changes. But questionable increased density in the ventral spinal canal, possibly hematoma which may be causing persistent or increased spinal stenosis and spinal cord mass effect. MRI would best evaluate further. 2. Other cervical spine levels appear stable from an August MRI. Chronic C5-C6 ACDF with pseudoarthrosis. Chronic C6-C7 ACDF with solid arthrodesis. Electronically Signed   By: Genevie Ann M.D.   On: 07/22/2021 05:06   MR Cervical Spine W or Wo  Contrast  Result Date: 07/22/2021 CLINICAL DATA:  70 year old male with postoperative day 3 status post extension of chronic cervical spine fusion. New onset weakness, unable to stand. Spasm. EXAM: MRI CERVICAL SPINE WITHOUT AND WITH CONTRAST TECHNIQUE: Multiplanar and multiecho pulse sequences of the cervical spine, to include the craniocervical junction and cervicothoracic junction, were obtained without and with intravenous contrast. CONTRAST:  56mL GADAVIST GADOBUTROL 1 MMOL/ML IV SOLN COMPARISON:  Cervical spine CT 0410 hours today. Cervical spine MRI 06/07/2021. FINDINGS: Alignment: Stable since August. Vertebrae: Mild new hardware susceptibility artifact at C3-C4. Chronic ACDF hardware C5 through C7. No cervical marrow edema or evidence of acute osseous abnormality, but there is new T2 heterogeneous fluid in the posterior interbody space at C3-C4 (series 7, image 8) and also situated around the chronically degenerated right facet at that level, versus fluid mostly in that facet joint on the August MRI. There is faint degenerative endplate marrow edema in the upper thoracic spine at T1-T2. Cord: Spinal stenosis and cord mass effect do appear increased at C3-C4. See further details below. No definite cord signal abnormality is identified there (series 5, image 8 and series 8, image 22). No abnormal intradural enhancement. But there does appear to be mild dural thickening and enhancement at the C3-C4 level. Posterior Fossa, vertebral arteries, paraspinal tissues: Cervicomedullary junction is within normal limits. Stable major vascular flow voids in the neck with dominant right and diminutive left vertebral arteries. Expected prevertebral postoperative changes appear stable from the earlier CT. Disc levels: C2-C3: Stable endplate and facet degeneration since August. Ligament flavum hypertrophy does appear mildly increased. No significant spinal stenosis at this level. Stable left C3 foraminal stenosis. C3-C4:  Interval ACDF. Ventral fluid collection contiguous with the interbody space (series 7, image 8) is superimposed on circumferential soft tissue thickening in the spinal canal at  that level which may be a combination of dural thickening and residual ligament flavum hypertrophy. Multifactorial spinal stenosis and spinal cord mass effect have increased as seen on series 8, images 22 and 23 (versus series 8, image 9 in August). Fluid about the right facet joint is subtle on axial images. C4-C5: Stable chronic endplate spurring and right greater than left facet hypertrophy. No significant spinal stenosis. Stable moderate to severe C5 foraminal stenosis. Stable postoperative appearance C5-C6 through C7-T1. IMPRESSION: 1. Postoperative increased spinal stenosis and spinal cord mass effect at C3-C4 when compared to the August MRI. This appears multifactorial, related to ventral spinal canal fluid contiguous with the interbody space (perhaps hematoma or seroma) and also some circumferential soft tissue thickening (perhaps acute dural thickening with associated enhancement). 2. No associated cervical spinal cord signal abnormality is identified. 3. Other cervical levels appear stable since the August MRI. 4. Expected prevertebral postoperative changes. Electronically Signed   By: Genevie Ann M.D.   On: 07/22/2021 06:59   DG C-Arm 1-60 Min-No Report  Result Date: 07/22/2021 Fluoroscopy was utilized by the requesting physician.  No radiographic interpretation.   DG C-Arm 1-60 Min-No Report  Result Date: 07/19/2021 Fluoroscopy was utilized by the requesting physician.  No radiographic interpretation.   DG C-Arm 1-60 Min-No Report  Result Date: 07/19/2021 Fluoroscopy was utilized by the requesting physician.  No radiographic interpretation.    Antibiotics:  Anti-infectives (From admission, onward)    Start     Dose/Rate Route Frequency Ordered Stop   07/22/21 2330  vancomycin (VANCOREADY) IVPB 1250 mg/250 mL         1,250 mg 166.7 mL/hr over 90 Minutes Intravenous  Once 07/22/21 1730 07/22/21 2300   07/22/21 1300  vancomycin (VANCOREADY) IVPB 1500 mg/300 mL        1,500 mg 150 mL/hr over 120 Minutes Intravenous To ShortStay Procedural 07/22/21 1115 07/22/21 1509       Discharge Exam: Blood pressure 123/64, pulse 77, temperature 97.6 F (36.4 C), resp. rate 18, height 5\' 11"  (1.803 m), weight 105.2 kg, SpO2 100 %. Upper and lower extremity weakness that is slowly improving.   Discharge Medications:   Allergies as of 07/30/2021       Reactions   Penicillins Other (See Comments)   SYNCOPAL EPISODE Has patient had a PCN reaction causing immediate rash, facial/tongue/throat swelling, SOB or LIGHTHEADEDNESS with HYPOTENSION [SYNCOPE] #  #  #  YES  #  #  #  Has patient had a PCN reaction causing severe rash involving mucus membranes or skin necrosis:No Has patient had a PCN reaction that required hospitalization:No Has patient had a PCN reaction occurring within the last 10 years:Yes   Sulfa Antibiotics Swelling   SWELLING REACTION UNSPECIFIED > PER PREVIOUS PMH   Zoloft [sertraline Hcl] Other (See Comments)   Confusion        Medication List     TAKE these medications    albuterol 108 (90 Base) MCG/ACT inhaler Commonly known as: VENTOLIN HFA Inhale 2 puffs into the lungs every 6 (six) hours as needed for wheezing or shortness of breath.   alendronate 70 MG tablet Commonly known as: FOSAMAX Take 1 tablet (70 mg total) by mouth every 7 (seven) days. Take with a full glass of water on an empty stomach.   aspirin 81 MG EC tablet Take 1 tablet (81 mg total) by mouth daily.   citalopram 20 MG tablet Commonly known as: CELEXA Take 1 tablet (20 mg total) by mouth  every morning.   cyclobenzaprine 10 MG tablet Commonly known as: FLEXERIL Take 1 tablet (10 mg total) by mouth 3 (three) times daily as needed for muscle spasms.   levothyroxine 150 MCG tablet Commonly known as: SYNTHROID TAKE  1 TABLET BY MOUTH EVERY DAY What changed:  how much to take how to take this when to take this additional instructions   lisinopril 10 MG tablet Commonly known as: ZESTRIL Take 1 tablet (10 mg total) by mouth daily.   metoprolol tartrate 50 MG tablet Commonly known as: LOPRESSOR Take 1 tablet (50 mg total) by mouth 2 (two) times daily.   multivitamin capsule Take 1 capsule by mouth daily.   nitroGLYCERIN 0.4 MG SL tablet Commonly known as: NITROSTAT PLACE 1 TABLET UNDER THE TONGUE EVERY 5 MINUTES AS NEEDED FOR CHEST PAINS. What changed: See the new instructions.   Oxycodone HCl 10 MG Tabs Take 1 tablet (10 mg total) by mouth every 3 (three) hours as needed for severe pain ((score 7 to 10)).   oxyCODONE-acetaminophen 5-325 MG tablet Commonly known as: PERCOCET/ROXICET Take 1 tablet by mouth every 4 (four) hours as needed for moderate pain.   pantoprazole 40 MG tablet Commonly known as: PROTONIX TAKE 1 TABLET(40 MG) BY MOUTH TWICE DAILY What changed: See the new instructions.   potassium chloride 10 MEQ tablet Commonly known as: KLOR-CON TAKE 1 TABLET(10 MEQ) BY MOUTH DAILY What changed: See the new instructions.   rosuvastatin 40 MG tablet Commonly known as: CRESTOR TAKE 1 TABLET(40 MG) BY MOUTH EVERY EVENING What changed: See the new instructions.   tamsulosin 0.4 MG Caps capsule Commonly known as: FLOMAX TAKE 2 CAPSULES BY MOUTH AT NIGHT What changed:  how much to take how to take this when to take this additional instructions   torsemide 20 MG tablet Commonly known as: DEMADEX TAKE 2 TABLETS(40 MG) BY MOUTH DAILY What changed: See the new instructions.        Disposition: CIR   Final Dx: reexploration of cervical wound for evacuation of epidural hematoma.  Discharge Instructions     Incentive spirometry RT   Complete by: As directed           Signed: Ocie Cornfield Rosaura Bolon 07/30/2021, 9:35 AM

## 2021-07-30 NOTE — Progress Notes (Signed)
Inpatient Rehab Admissions Coordinator:   I have a CIR bed for this Pt. On Sunday 08/01/21. Dr .Reatha Armour confirmed that attending service will place d/c orders over the weekend. RN may call report to [phone].  Clemens Catholic, [place], [place] Admissions Coordinator  [phone] ([place]) [phone] (office)

## 2021-07-31 NOTE — Progress Notes (Signed)
Overall doing reasonably well.  Minimal neck pain.  Upper extremity symptoms on the right are stable.  Wound clean and dry.  Abdomen soft.  Status post anterior cervical decompression and fusion surgery.  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  Continue efforts at mobilization and therapy.  Awaiting transfer to rehab unit.

## 2021-07-31 NOTE — Plan of Care (Signed)
  Problem: Activity: Goal: Ability to avoid complications of mobility impairment will improve 07/31/2021 0928 by Katherina Right, RN Outcome: Progressing 07/31/2021 0928 by Katherina Right, RN Outcome: Progressing Goal: Ability to tolerate increased activity will improve 07/31/2021 0928 by Katherina Right, RN Outcome: Progressing 07/31/2021 0928 by Katherina Right, RN Outcome: Progressing Goal: Will remain free from falls 07/31/2021 0928 by Katherina Right, RN Outcome: Progressing 07/31/2021 0928 by Katherina Right, RN Outcome: Progressing   Problem: Safety: Goal: Ability to remain free from injury will improve 07/31/2021 0928 by Katherina Right, RN Outcome: Progressing 07/31/2021 0928 by Katherina Right, RN Outcome: Progressing   Problem: Education: Goal: Knowledge of General Education information will improve Description: Including pain rating scale, medication(s)/side effects and non-pharmacologic comfort measures 07/31/2021 0928 by Katherina Right, RN Outcome: Progressing 07/31/2021 0928 by Katherina Right, RN Outcome: Progressing   Problem: Health Behavior/Discharge Planning: Goal: Ability to manage health-related needs will improve 07/31/2021 0928 by Katherina Right, RN Outcome: Progressing 07/31/2021 0928 by Katherina Right, RN Outcome: Progressing   Problem: Clinical Measurements: Goal: Ability to maintain clinical measurements within normal limits will improve 07/31/2021 0928 by Katherina Right, RN Outcome: Progressing 07/31/2021 0928 by Katherina Right, RN Outcome: Progressing Goal: Will remain free from infection 07/31/2021 0928 by Katherina Right, RN Outcome: Progressing 07/31/2021 0928 by Katherina Right, RN Outcome: Progressing Goal: Diagnostic test results will improve 07/31/2021 0928 by Katherina Right, RN Outcome: Progressing 07/31/2021 0928 by Katherina Right, RN Outcome: Progressing Goal: Respiratory complications will improve 07/31/2021 0928 by Katherina Right, RN Outcome: Progressing 07/31/2021  0928 by Katherina Right, RN Outcome: Progressing Goal: Cardiovascular complication will be avoided 07/31/2021 0928 by Katherina Right, RN Outcome: Progressing 07/31/2021 0928 by Katherina Right, RN Outcome: Progressing   Problem: Activity: Goal: Risk for activity intolerance will decrease 07/31/2021 0928 by Katherina Right, RN Outcome: Progressing 07/31/2021 0928 by Katherina Right, RN Outcome: Progressing   Problem: Nutrition: Goal: Adequate nutrition will be maintained 07/31/2021 0928 by Katherina Right, RN Outcome: Progressing 07/31/2021 0928 by Katherina Right, RN Outcome: Progressing   Problem: Coping: Goal: Level of anxiety will decrease 07/31/2021 0928 by Katherina Right, RN Outcome: Progressing 07/31/2021 0928 by Katherina Right, RN Outcome: Progressing   Problem: Elimination: Goal: Will not experience complications related to bowel motility 07/31/2021 0928 by Katherina Right, RN Outcome: Progressing 07/31/2021 0928 by Katherina Right, RN Outcome: Progressing Goal: Will not experience complications related to urinary retention 07/31/2021 0928 by Katherina Right, RN Outcome: Progressing 07/31/2021 0928 by Katherina Right, RN Outcome: Progressing   Problem: Pain Managment: Goal: General experience of comfort will improve 07/31/2021 0928 by Katherina Right, RN Outcome: Progressing 07/31/2021 0928 by Katherina Right, RN Outcome: Progressing   Problem: Skin Integrity: Goal: Risk for impaired skin integrity will decrease 07/31/2021 0928 by Katherina Right, RN Outcome: Progressing 07/31/2021 0928 by Katherina Right, RN Outcome: Progressing

## 2021-08-01 ENCOUNTER — Inpatient Hospital Stay (HOSPITAL_COMMUNITY)
Admission: RE | Admit: 2021-08-01 | Discharge: 2021-08-24 | DRG: 945 | Disposition: A | Discharge status: Home Health | Payer: Medicare Other | Source: Intra-hospital | Attending: Physical Medicine and Rehabilitation | Admitting: Physical Medicine and Rehabilitation

## 2021-08-01 DIAGNOSIS — R2243 Localized swelling, mass and lump, lower limb, bilateral: Secondary | ICD-10-CM | POA: Diagnosis not present

## 2021-08-01 DIAGNOSIS — Z800 Family history of malignant neoplasm of digestive organs: Secondary | ICD-10-CM

## 2021-08-01 DIAGNOSIS — N319 Neuromuscular dysfunction of bladder, unspecified: Secondary | ICD-10-CM

## 2021-08-01 DIAGNOSIS — E038 Other specified hypothyroidism: Secondary | ICD-10-CM

## 2021-08-01 DIAGNOSIS — Z882 Allergy status to sulfonamides status: Secondary | ICD-10-CM | POA: Diagnosis not present

## 2021-08-01 DIAGNOSIS — I5042 Chronic combined systolic (congestive) and diastolic (congestive) heart failure: Secondary | ICD-10-CM | POA: Diagnosis present

## 2021-08-01 DIAGNOSIS — G9589 Other specified diseases of spinal cord: Secondary | ICD-10-CM | POA: Diagnosis present

## 2021-08-01 DIAGNOSIS — I10 Essential (primary) hypertension: Secondary | ICD-10-CM

## 2021-08-01 DIAGNOSIS — I2510 Atherosclerotic heart disease of native coronary artery without angina pectoris: Secondary | ICD-10-CM | POA: Diagnosis present

## 2021-08-01 DIAGNOSIS — K219 Gastro-esophageal reflux disease without esophagitis: Secondary | ICD-10-CM | POA: Diagnosis present

## 2021-08-01 DIAGNOSIS — Z87891 Personal history of nicotine dependence: Secondary | ICD-10-CM | POA: Diagnosis not present

## 2021-08-01 DIAGNOSIS — G8252 Quadriplegia, C1-C4 incomplete: Secondary | ICD-10-CM | POA: Diagnosis present

## 2021-08-01 DIAGNOSIS — Z7982 Long term (current) use of aspirin: Secondary | ICD-10-CM

## 2021-08-01 DIAGNOSIS — E871 Hypo-osmolality and hyponatremia: Secondary | ICD-10-CM | POA: Diagnosis not present

## 2021-08-01 DIAGNOSIS — I110 Hypertensive heart disease with heart failure: Secondary | ICD-10-CM | POA: Diagnosis present

## 2021-08-01 DIAGNOSIS — Z981 Arthrodesis status: Secondary | ICD-10-CM | POA: Diagnosis not present

## 2021-08-01 DIAGNOSIS — L89322 Pressure ulcer of left buttock, stage 2: Secondary | ICD-10-CM | POA: Diagnosis not present

## 2021-08-01 DIAGNOSIS — R338 Other retention of urine: Secondary | ICD-10-CM | POA: Diagnosis present

## 2021-08-01 DIAGNOSIS — Z0000 Encounter for general adult medical examination without abnormal findings: Secondary | ICD-10-CM

## 2021-08-01 DIAGNOSIS — Z880 Allergy status to penicillin: Secondary | ICD-10-CM | POA: Diagnosis not present

## 2021-08-01 DIAGNOSIS — L89309 Pressure ulcer of unspecified buttock, unspecified stage: Secondary | ICD-10-CM | POA: Diagnosis not present

## 2021-08-01 DIAGNOSIS — G959 Disease of spinal cord, unspecified: Secondary | ICD-10-CM

## 2021-08-01 DIAGNOSIS — S14103D Unspecified injury at C3 level of cervical spinal cord, subsequent encounter: Secondary | ICD-10-CM | POA: Diagnosis not present

## 2021-08-01 DIAGNOSIS — Z79890 Hormone replacement therapy (postmenopausal): Secondary | ICD-10-CM

## 2021-08-01 DIAGNOSIS — G8250 Quadriplegia, unspecified: Secondary | ICD-10-CM | POA: Diagnosis present

## 2021-08-01 DIAGNOSIS — R262 Difficulty in walking, not elsewhere classified: Secondary | ICD-10-CM | POA: Diagnosis not present

## 2021-08-01 DIAGNOSIS — Z7983 Long term (current) use of bisphosphonates: Secondary | ICD-10-CM

## 2021-08-01 DIAGNOSIS — E039 Hypothyroidism, unspecified: Secondary | ICD-10-CM | POA: Diagnosis present

## 2021-08-01 DIAGNOSIS — E782 Mixed hyperlipidemia: Secondary | ICD-10-CM | POA: Diagnosis present

## 2021-08-01 DIAGNOSIS — S064XAA Epidural hemorrhage with loss of consciousness status unknown, initial encounter: Secondary | ICD-10-CM | POA: Diagnosis present

## 2021-08-01 DIAGNOSIS — L8990 Pressure ulcer of unspecified site, unspecified stage: Secondary | ICD-10-CM | POA: Insufficient documentation

## 2021-08-01 DIAGNOSIS — N401 Enlarged prostate with lower urinary tract symptoms: Secondary | ICD-10-CM | POA: Diagnosis present

## 2021-08-01 DIAGNOSIS — K592 Neurogenic bowel, not elsewhere classified: Secondary | ICD-10-CM | POA: Diagnosis present

## 2021-08-01 DIAGNOSIS — K5900 Constipation, unspecified: Secondary | ICD-10-CM | POA: Diagnosis present

## 2021-08-01 DIAGNOSIS — Z79899 Other long term (current) drug therapy: Secondary | ICD-10-CM

## 2021-08-01 DIAGNOSIS — Z8249 Family history of ischemic heart disease and other diseases of the circulatory system: Secondary | ICD-10-CM | POA: Diagnosis not present

## 2021-08-01 DIAGNOSIS — Z23 Encounter for immunization: Secondary | ICD-10-CM

## 2021-08-01 DIAGNOSIS — I25118 Atherosclerotic heart disease of native coronary artery with other forms of angina pectoris: Secondary | ICD-10-CM

## 2021-08-01 DIAGNOSIS — Z888 Allergy status to other drugs, medicaments and biological substances status: Secondary | ICD-10-CM

## 2021-08-01 DIAGNOSIS — E785 Hyperlipidemia, unspecified: Secondary | ICD-10-CM | POA: Diagnosis present

## 2021-08-01 DIAGNOSIS — M6281 Muscle weakness (generalized): Secondary | ICD-10-CM | POA: Diagnosis not present

## 2021-08-01 DIAGNOSIS — Z951 Presence of aortocoronary bypass graft: Secondary | ICD-10-CM | POA: Diagnosis not present

## 2021-08-01 DIAGNOSIS — E7849 Other hyperlipidemia: Secondary | ICD-10-CM

## 2021-08-01 DIAGNOSIS — D696 Thrombocytopenia, unspecified: Secondary | ICD-10-CM | POA: Diagnosis not present

## 2021-08-01 DIAGNOSIS — R200 Anesthesia of skin: Secondary | ICD-10-CM

## 2021-08-01 DIAGNOSIS — L89312 Pressure ulcer of right buttock, stage 2: Secondary | ICD-10-CM | POA: Diagnosis not present

## 2021-08-01 MED ORDER — LEVOTHYROXINE SODIUM 75 MCG PO TABS
150.0000 ug | ORAL_TABLET | Freq: Every day | ORAL | Status: DC
  Administered 2021-08-02 – 2021-08-24 (×23): 150 ug via ORAL
  Filled 2021-08-01 (×23): qty 2

## 2021-08-01 MED ORDER — PANTOPRAZOLE SODIUM 40 MG PO TBEC
40.0000 mg | DELAYED_RELEASE_TABLET | Freq: Every day | ORAL | Status: DC
  Administered 2021-08-02 – 2021-08-24 (×23): 40 mg via ORAL
  Filled 2021-08-01 (×23): qty 1

## 2021-08-01 MED ORDER — ONDANSETRON HCL 4 MG/2ML IJ SOLN: 4.0000 mg | Freq: Four times a day (QID) | INTRAMUSCULAR | Status: DC | PRN

## 2021-08-01 MED ORDER — LISINOPRIL 10 MG PO TABS
10.0000 mg | ORAL_TABLET | Freq: Every day | ORAL | Status: DC
  Administered 2021-08-02 – 2021-08-09 (×8): 10 mg via ORAL
  Filled 2021-08-01 (×8): qty 1

## 2021-08-01 MED ORDER — DIPHENHYDRAMINE HCL 50 MG/ML IJ SOLN: 50.0000 mg | Freq: Once | INTRAMUSCULAR | Status: DC | PRN

## 2021-08-01 MED ORDER — FAMOTIDINE IN NACL 20-0.9 MG/50ML-% IV SOLN: 20.0000 mg | Freq: Once | INTRAVENOUS | Status: DC | PRN

## 2021-08-01 MED ORDER — ADULT MULTIVITAMIN W/MINERALS CH
1.0000 | ORAL_TABLET | Freq: Every day | ORAL | Status: DC
  Administered 2021-08-02 – 2021-08-24 (×23): 1 via ORAL
  Filled 2021-08-01 (×23): qty 1

## 2021-08-01 MED ORDER — ACETAMINOPHEN 650 MG RE SUPP: 650.0000 mg | Freq: Four times a day (QID) | RECTAL | Status: DC | PRN

## 2021-08-01 MED ORDER — POLYETHYLENE GLYCOL 3350 17 G PO PACK: 17.0000 g | PACK | Freq: Every day | ORAL | Status: DC | PRN

## 2021-08-01 MED ORDER — ALUM & MAG HYDROXIDE-SIMETH 200-200-20 MG/5ML PO SUSP: 30.0000 mL | Freq: Four times a day (QID) | ORAL | Status: DC | PRN

## 2021-08-01 MED ORDER — SODIUM CHLORIDE 0.9 % IV SOLN: INTRAVENOUS | Status: DC | PRN

## 2021-08-01 MED ORDER — SENNA 8.6 MG PO TABS
2.0000 | ORAL_TABLET | Freq: Every day | ORAL | Status: DC
  Administered 2021-08-02 – 2021-08-10 (×9): 17.2 mg via ORAL
  Filled 2021-08-01 (×9): qty 2

## 2021-08-01 MED ORDER — OXYCODONE-ACETAMINOPHEN 5-325 MG PO TABS
1.0000 | ORAL_TABLET | ORAL | Status: DC | PRN
  Administered 2021-08-01 – 2021-08-05 (×18): 2 via ORAL
  Administered 2021-08-05: 1 via ORAL
  Administered 2021-08-05 – 2021-08-07 (×10): 2 via ORAL
  Administered 2021-08-07: 1 via ORAL
  Administered 2021-08-08 – 2021-08-11 (×13): 2 via ORAL
  Administered 2021-08-11: 1 via ORAL
  Administered 2021-08-11 – 2021-08-22 (×44): 2 via ORAL
  Administered 2021-08-22: 1 via ORAL
  Administered 2021-08-22 – 2021-08-24 (×8): 2 via ORAL
  Filled 2021-08-01 (×94): qty 2
  Filled 2021-08-01: qty 1
  Filled 2021-08-01 (×3): qty 2
  Filled 2021-08-01: qty 1

## 2021-08-01 MED ORDER — BISACODYL 10 MG RE SUPP
10.0000 mg | Freq: Every day | RECTAL | Status: DC | PRN
Start: 2021-08-01 — End: 2021-08-04

## 2021-08-01 MED ORDER — ACETAMINOPHEN 325 MG PO TABS
650.0000 mg | ORAL_TABLET | Freq: Four times a day (QID) | ORAL | Status: DC | PRN
  Administered 2021-08-15: 650 mg via ORAL
  Filled 2021-08-01: qty 2

## 2021-08-01 MED ORDER — POTASSIUM CHLORIDE CRYS ER 10 MEQ PO TBCR
10.0000 meq | EXTENDED_RELEASE_TABLET | Freq: Every day | ORAL | Status: DC
  Administered 2021-08-02 – 2021-08-09 (×8): 10 meq via ORAL
  Filled 2021-08-01 (×8): qty 1

## 2021-08-01 MED ORDER — DOCUSATE SODIUM 100 MG PO CAPS: 100.0000 mg | ORAL_CAPSULE | Freq: Two times a day (BID) | ORAL | Status: DC

## 2021-08-01 MED ORDER — CITALOPRAM HYDROBROMIDE 20 MG PO TABS
20.0000 mg | ORAL_TABLET | Freq: Every morning | ORAL | Status: DC
  Administered 2021-08-02 – 2021-08-24 (×23): 20 mg via ORAL
  Filled 2021-08-01: qty 1
  Filled 2021-08-01 (×2): qty 2
  Filled 2021-08-01: qty 1
  Filled 2021-08-01: qty 2
  Filled 2021-08-01 (×6): qty 1
  Filled 2021-08-01 (×2): qty 2
  Filled 2021-08-01 (×2): qty 1
  Filled 2021-08-01 (×2): qty 2
  Filled 2021-08-01: qty 1
  Filled 2021-08-01 (×2): qty 2
  Filled 2021-08-01: qty 1
  Filled 2021-08-01: qty 2
  Filled 2021-08-01: qty 1

## 2021-08-01 MED ORDER — ALBUTEROL SULFATE HFA 108 (90 BASE) MCG/ACT IN AERS: 2.0000 | INHALATION_SPRAY | Freq: Once | RESPIRATORY_TRACT | Status: DC | PRN

## 2021-08-01 MED ORDER — METHYLPREDNISOLONE SODIUM SUCC 125 MG IJ SOLR: 125.0000 mg | Freq: Once | INTRAMUSCULAR | Status: DC | PRN

## 2021-08-01 MED ORDER — ROSUVASTATIN CALCIUM 20 MG PO TABS
40.0000 mg | ORAL_TABLET | Freq: Every day | ORAL | Status: DC
  Administered 2021-08-02 – 2021-08-24 (×23): 40 mg via ORAL
  Filled 2021-08-01 (×23): qty 2

## 2021-08-01 MED ORDER — SORBITOL 70 % SOLN
30.0000 mL | Freq: Once | Status: AC
  Administered 2021-08-01: 30 mL via ORAL
  Filled 2021-08-01: qty 30

## 2021-08-01 MED ORDER — ONDANSETRON HCL 4 MG PO TABS
4.0000 mg | ORAL_TABLET | Freq: Four times a day (QID) | ORAL | Status: DC | PRN
  Filled 2021-08-01: qty 1

## 2021-08-01 MED ORDER — SODIUM CHLORIDE 0.9 % IV SOLN: 1200.0000 mg | Freq: Once | INTRAVENOUS | Status: DC

## 2021-08-01 MED ORDER — TAMSULOSIN HCL 0.4 MG PO CAPS
0.8000 mg | ORAL_CAPSULE | Freq: Every day | ORAL | Status: DC
  Administered 2021-08-01 – 2021-08-23 (×23): 0.8 mg via ORAL
  Filled 2021-08-01 (×23): qty 2

## 2021-08-01 MED ORDER — METHOCARBAMOL 500 MG PO TABS
500.0000 mg | ORAL_TABLET | Freq: Four times a day (QID) | ORAL | Status: DC | PRN
  Administered 2021-08-02 – 2021-08-09 (×17): 500 mg via ORAL
  Filled 2021-08-01 (×17): qty 1

## 2021-08-01 MED ORDER — GABAPENTIN 300 MG PO CAPS
300.0000 mg | ORAL_CAPSULE | Freq: Two times a day (BID) | ORAL | Status: DC
  Administered 2021-08-01 – 2021-08-03 (×4): 300 mg via ORAL
  Filled 2021-08-01 (×4): qty 1

## 2021-08-01 MED ORDER — ALBUTEROL SULFATE (2.5 MG/3ML) 0.083% IN NEBU: 3.0000 mL | INHALATION_SOLUTION | Freq: Four times a day (QID) | RESPIRATORY_TRACT | Status: DC | PRN

## 2021-08-01 MED ORDER — EPINEPHRINE 0.3 MG/0.3ML IJ SOAJ: 0.3000 mg | Freq: Once | INTRAMUSCULAR | Status: DC | PRN

## 2021-08-01 MED ORDER — METOPROLOL TARTRATE 50 MG PO TABS
50.0000 mg | ORAL_TABLET | Freq: Two times a day (BID) | ORAL | Status: DC
  Administered 2021-08-01 – 2021-08-24 (×46): 50 mg via ORAL
  Filled 2021-08-01 (×46): qty 1

## 2021-08-01 MED ORDER — TORSEMIDE 20 MG PO TABS
20.0000 mg | ORAL_TABLET | Freq: Two times a day (BID) | ORAL | Status: DC
  Administered 2021-08-01 – 2021-08-10 (×18): 20 mg via ORAL
  Filled 2021-08-01 (×18): qty 1

## 2021-08-01 NOTE — Discharge Instructions (Addendum)
Inpatient Rehab Discharge Instructions  [name] Discharge date and time: No discharge date for patient encounter.   Activities/Precautions/ Functional Status: Activity: As tolerated/cervical collar as directed Diet: Regular Wound Care: Routine skin checks Functional status:  ___ No restrictions     ___ Walk up steps independently ___ 24/7 supervision/assistance   ___ Walk up steps with assistance ___ Intermittent supervision/assistance  ___ Bathe/dress independently ___ Walk with walker     _x__ Bathe/dress with assistance ___ Walk Independently    ___ Shower independently ___ Walk with assistance    ___ Shower with assistance ___ No alcohol     ___ Return to work/school ________   COMMUNITY REFERRALS UPON DISCHARGE:    Home Health:   PT      OT      RN     SNA                     Agency:[facility]  [phone] *Please expect follow-up within 2-3 days of discharge to schedule your home visit. If you have not received follow-up, be sure to contact the branch directly.*   Medical Equipment/Items Ordered:rolling walker and drop arm bedside commode                                                 Agency/Supplier:[facility] [phone]    Special Instructions: No driving smoking or alcohol  Continue Foley tube as directed   My questions have been answered and I understand these instructions. I will adhere to these goals and the provided educational materials after my discharge from the hospital.  Patient/Caregiver Signature _______________________________ Date __________  Clinician Signature _______________________________________ Date __________  Please bring this form and your medication list with you to all your follow-up doctor's appointments.

## 2021-08-01 NOTE — Progress Notes (Signed)
Overall stable.  No new issues or problems.  Patient still with some posterior cervical discomfort.  No radiating pain.  Right-sided weakness stable.  Afebrile.  Vital signs are stable.  Right upper and lower extremity weakness 4-/5 with decreased grip strength on the right.  Left-sided strength reasonably intact.  Progress and respond well.  Continue efforts at therapy and awaiting discharge to rehab unit.

## 2021-08-01 NOTE — H&P (Signed)
Physical Medicine and Rehabilitation Admission H&P        Chief Complaint  Patient presents with   Extremity Weakness  :nontraumatic myelopathy due to epidural hematoma     HPI: [name]. Coward is a 70 year old right-handed male with history significant for ITP, hypertension, CAD with CABG 2019, hyperlipidemia.  Per chart review patient lives with spouse.  1 level home 3 steps to entry.  Independent with assistive device.  Patient with recent admission for C3/4 ACDF by 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  He was discharged home 07/20/2021 ambulating 400 feet supervision without assistive device.  Presented 07/22/2021 with numbness tingling, lightening sensation and weakness going down his arms and legs while taking a shower.  Work-up and imaging showed epidural hematoma at C3-4 with cord compression.  Underwent revision arthrodesis C3-4 anterior interbody technique evacuation of epidural hematoma placement of intervertebral biomechanical device C3-4 07/22/2021 per [name].  Patient continues with cervical brace.  Tolerating a regular consistency diet.  Therapy evaluations completed due to patient decreased functional mobility was admitted for a comprehensive rehab program.     Pt reports having UE and LE weakness since developed epidural hematoma.  LBM 3 days ago-decreased appeitite and feeling full.   also having urinary retention- and requiring a foley att his time.  On Floamx 0.8 mg nightly.  Having a lot of nerve pain- stinging- most of time pain OK, but sometimes, acutely worse- wondering if can have gabapentin for it.        Review of Systems  Constitutional:  Negative for chills and fever.  HENT:  Negative for hearing loss.   Eyes:  Negative for blurred vision and double vision.  Respiratory:  Negative for cough and shortness of breath.   Cardiovascular:  Negative for chest pain, palpitations and leg swelling.   Gastrointestinal:  Positive for constipation. Negative for heartburn, nausea and vomiting.       GERD  Genitourinary:  Negative for dysuria, flank pain and hematuria.  Musculoskeletal:  Positive for back pain, joint pain and myalgias.  Skin:  Negative for rash.  Neurological:  Positive for weakness.       Numbness and tingling sensation going down both arms and legs  Psychiatric/Behavioral:  Positive for depression. The patient has insomnia.   All other systems reviewed and are negative.     Past Medical History:  Diagnosis Date   Allergy     Carpal tunnel syndrome     Chronic back pain     Chronic combined systolic and diastolic congestive heart failure (HCC)     Chronic ITP (idiopathic thrombocytopenia) (HCC) 05/25/2015   Coronary artery disease     Coronary artery disease involving native coronary artery of native heart with angina pectoris (HCC)     Degenerative disc disease     GERD (gastroesophageal reflux disease)     History of thrombocytopenia     Hypercholesteremia     Hypertension     Hypothyroid     Lumbar pain     Myocardial infarction ([place]) 2009   Obesity     Pneumonia      around age 68   S/P CABG x 3 04/27/2018    LIMA to LAD SVG to OM1 SVG to OM2   Shortness of breath dyspnea           Past Surgical History:  Procedure Laterality Date   ANTERIOR CERVICAL DECOMP/DISCECTOMY FUSION N/A 07/19/2021    Procedure:  CERVICAL THREE-FOUR ANTERIOR CERVICAL DECOMPRESSION/DISCECTOMY FUSION;  Surgeon: [name], MD;  Location: [facility];  Service: Neurosurgery;  Laterality: N/A;   ANTERIOR CERVICAL DECOMP/DISCECTOMY FUSION N/A 07/22/2021    Procedure: ANTERIOR CERVICAL DECOMPRESSION/DISCECTOMY REVISON FUSION  WITH EVACUATION OF EPIDURAL HEMATOMA;  Surgeon: [name], DO;  Location: [facility];  Service: Neurosurgery;  Laterality: N/A;   BACK SURGERY        5 lumbas disc with cervical and lumbar fusions   BIOPSY N/A 03/06/2014    Procedure: ESOPHAGEAL BIOPSIES;  Surgeon:  [name], MD;  Location: [facility];  Service: Endoscopy;  Laterality: N/A;   COLONOSCOPY       COLONOSCOPY WITH PROPOFOL N/A 03/06/2014    Procedure: COLONOSCOPY WITH PROPOFOL;  Surgeon: [name], MD;  Location: [facility];  Service: Endoscopy;  Laterality: N/A;  in cecum at 0807; total withdrawal time 9 minutes   CORONARY ANGIOPLASTY WITH STENT PLACEMENT        2000, and 2004 has 3 stents   CORONARY ARTERY BYPASS GRAFT N/A 04/27/2018    Procedure: CORONARY ARTERY BYPASS GRAFTING (CABG) x Three , using left internal mammary artery and right leg greater saphenous vein;  Surgeon: [name], MD;  Location: [facility];  Service: Open Heart Surgery;  Laterality: N/A;   ESOPHAGOGASTRODUODENOSCOPY (EGD) WITH PROPOFOL N/A 03/06/2014    Procedure: ESOPHAGOGASTRODUODENOSCOPY (EGD) WITH PROPOFOL;  Surgeon: [name], MD;  Location: [facility];  Service: Endoscopy;  Laterality: N/A;   LEFT HEART CATH AND CORONARY ANGIOGRAPHY N/A 04/24/2018    Procedure: LEFT HEART CATH AND CORONARY ANGIOGRAPHY;  Surgeon: [name], MD;  Location: [facility];  Service: Cardiovascular;  Laterality: N/A;   LUMBAR FUSION   2009   MALONEY DILATION N/A 03/06/2014    Procedure: MALONEY DILATION 54 french;  Surgeon: [name], MD;  Location: [facility];  Service: Endoscopy;  Laterality: N/A;   NECK SURGERY       TEE WITHOUT CARDIOVERSION N/A 04/27/2018    Procedure: TRANSESOPHAGEAL ECHOCARDIOGRAM (TEE);  Surgeon: [name], MD;  Location: [facility];  Service: Open Heart Surgery;  Laterality: N/A;         Family History  Problem Relation Age of Onset   Heart attack Father     Heart disease Father     Colon cancer Maternal Grandmother     Esophageal cancer Neg Hx     Rectal cancer Neg Hx     Stomach cancer Neg Hx      Social History:  reports that he quit smoking about 30 years ago. His smoking use included cigarettes. He has a 40.00 pack-year smoking history. He has never used smokeless tobacco. He  reports current alcohol use. He reports that he does not use drugs. Allergies:       Allergies  Allergen Reactions   Penicillins Other (See Comments)      SYNCOPAL EPISODE   Has patient had a PCN reaction causing immediate rash, facial/tongue/throat swelling, SOB or LIGHTHEADEDNESS with HYPOTENSION [SYNCOPE] #  #  #  YES  #  #  #  Has patient had a PCN reaction causing severe rash involving mucus membranes or skin necrosis:No Has patient had a PCN reaction that required hospitalization:No Has patient had a PCN reaction occurring within the last 10 years:Yes     Sulfa Antibiotics Swelling      SWELLING REACTION UNSPECIFIED > PER PREVIOUS PMH   Zoloft [Sertraline Hcl] Other (See Comments)  Confusion            Medications Prior to Admission  Medication Sig Dispense Refill   albuterol (PROVENTIL HFA;VENTOLIN HFA) 108 (90 Base) MCG/ACT inhaler Inhale 2 puffs into the lungs every 6 (six) hours as needed for wheezing or shortness of breath. 1 Inhaler 0   aspirin EC 81 MG EC tablet Take 1 tablet (81 mg total) by mouth daily.       citalopram (CELEXA) 20 MG tablet Take 1 tablet (20 mg total) by mouth every morning. 90 tablet 1   cyclobenzaprine (FLEXERIL) 10 MG tablet Take 1 tablet (10 mg total) by mouth 3 (three) times daily as needed for muscle spasms. 30 tablet 0   levothyroxine (SYNTHROID) 150 MCG tablet TAKE 1 TABLET BY MOUTH EVERY DAY (Patient taking differently: Take 150 mcg by mouth daily.) 90 tablet 1   lisinopril (ZESTRIL) 10 MG tablet Take 1 tablet (10 mg total) by mouth daily. 90 tablet 1   metoprolol tartrate (LOPRESSOR) 50 MG tablet Take 1 tablet (50 mg total) by mouth 2 (two) times daily. 180 tablet 1   Multiple Vitamin (MULTIVITAMIN) capsule Take 1 capsule by mouth daily.         oxyCODONE 10 MG TABS Take 1 tablet (10 mg total) by mouth every 3 (three) hours as needed for severe pain ((score 7 to 10)). 30 tablet 0   pantoprazole (PROTONIX) 40 MG tablet TAKE 1 TABLET(40 MG)  BY MOUTH TWICE DAILY (Patient taking differently: Take 40 mg by mouth daily.) 180 tablet 1   potassium chloride (KLOR-CON) 10 MEQ tablet TAKE 1 TABLET(10 MEQ) BY MOUTH DAILY (Patient taking differently: Take 10 mEq by mouth daily.) 90 tablet 0   rosuvastatin (CRESTOR) 40 MG tablet TAKE 1 TABLET(40 MG) BY MOUTH EVERY EVENING (Patient taking differently: Take 40 mg by mouth daily.) 90 tablet 0   tamsulosin (FLOMAX) 0.4 MG CAPS capsule TAKE 2 CAPSULES BY MOUTH AT NIGHT (Patient taking differently: Take 0.8 mg by mouth at bedtime.) 180 capsule 0   torsemide (DEMADEX) 20 MG tablet TAKE 2 TABLETS(40 MG) BY MOUTH DAILY (Patient taking differently: Take 20 mg by mouth 2 (two) times daily.) 180 tablet 0   alendronate (FOSAMAX) 70 MG tablet Take 1 tablet (70 mg total) by mouth every 7 (seven) days. Take with a full glass of water on an empty stomach. (Patient not taking: Reported on 07/22/2021) 4 tablet 11   nitroGLYCERIN (NITROSTAT) 0.4 MG SL tablet PLACE 1 TABLET UNDER THE TONGUE EVERY 5 MINUTES AS NEEDED FOR CHEST PAINS. (Patient taking differently: Place 0.4 mg under the tongue every 5 (five) minutes as needed for chest pain.) 25 tablet 3   oxyCODONE-acetaminophen (PERCOCET/ROXICET) 5-325 MG tablet Take 1 tablet by mouth every 4 (four) hours as needed for moderate pain. (Patient not taking: No sig reported) 30 tablet 0      Drug Regimen Review Drug regimen was reviewed and remains appropriate with no significant issues identified   Home: Home Living Family/patient expects to be discharged to:: Private residence Living Arrangements: Spouse/significant other Available Help at Discharge: Family, Available 24 hours/day Type of Home: House Home Access: Stairs to enter [employer] of Steps: 3 Entrance Stairs-Rails: Right, Left Home Layout: One level Bathroom Shower/Tub: Multimedia programmer: Handicapped height Home Equipment: Environmental consultant - 2 wheels, Environmental consultant - 4 wheels, Shower seat - built  in, [employer] held shower head, Grab bars - tub/shower  Lives With: Spouse   Functional History: Prior Function Level of Independence:  Independent with assistive device(s) Comments: usually would fall from initial standing, used rollator for longer distances, RW in the home   Functional Status:  Mobility: Bed Mobility Overal bed mobility: Needs Assistance Bed Mobility: Rolling, Sidelying to Sit Rolling: Supervision Sidelying to sit: Min assist Sit to supine: Mod assist Sit to sidelying: Min assist General bed mobility comments: up in chair following bathing with nursing Transfers Overall transfer level: Needs assistance Equipment used: 4-wheeled walker, Bilateral platform walker (EVA walker) Transfers: Sit to/from Stand Sit to Stand: +2 physical assistance, Mod assist Stand pivot transfers: Total assist General transfer comment: first to upright walker with difficulty getting hips under him so returned to sit, then 2 trials to regular RW and pt needing to sit after standing briefly again with dfficulty getting knees extended and R UE weakness and R LE buckling, stood x 5 to [facility] and then transferred to chair able to maintain standing about 12 sec Ambulation/Gait Ambulation/Gait assistance: Mod assist, +2 physical assistance Gait Distance (Feet): 2 Feet Assistive device: 4-wheeled walker, Bilateral platform walker (Eva walker) Gait Pattern/deviations: Step-to pattern, Ataxic, Scissoring, Narrow base of support General Gait Details: able to take one step forward and one step back during second stand to eva walker, then took two steps forward and two back during third stand, initially unable to step due to bilateral knee buckling and pt unaware Gait velocity: decreased Gait velocity interpretation: <1.31 ft/sec, indicative of household ambulator   ADL: ADL Overall ADL's : Needs assistance/impaired Eating/Feeding: Minimal assistance, Sitting Eating/Feeding Details (indicate cue type and  reason): Pt has been eating mainly with LUE, he is getting good movement back in RUE but decreased coordination. Encouraged him to order at least one finger food item wtih each meal so he can eat with is right hand more (don't really want him using utensils as of yet due to hitting/poking himself with them). Asked his wife to bring in a "sippy cup"--one that won't spill so he can try and drink with that hand as well. Grooming: Minimal assistance, Sitting Upper Body Bathing: Minimal assistance, Sitting Lower Body Bathing: Moderate assistance, Sitting/lateral leans, Sit to/from stand Upper Body Dressing : Minimal assistance, Sitting Lower Body Dressing: Maximal assistance, Sit to/from stand Toileting- Clothing Manipulation and Hygiene: Maximal assistance, Sitting/lateral lean, Sit to/from stand General ADL Comments: Pt with significant deficits in coordination, R UE strength, BLE strength and balance with inability to ambulate or complete ADLs without increased physical assist   Cognition: Cognition Overall Cognitive Status: Within Functional Limits for tasks assessed Orientation Level: (P) Oriented X4 Cognition Arousal/Alertness: Awake/alert Behavior During Therapy: WFL for tasks assessed/performed Overall Cognitive Status: Within Functional Limits for tasks assessed   Physical Exam: Blood pressure (!) 163/78, pulse 72, temperature (!) 97.5 F (36.4 C), temperature source Oral, resp. rate 18, height 5\' 11"  (1.803 m), weight 105.2 kg, SpO2 98 %. Physical Exam Vitals and nursing note reviewed. Exam conducted with a chaperone present.  Constitutional:      Comments: Sitting up in bed; appropriate, brother in law in room; c/o fullness, NAD  HENT:     Head: Normocephalic and atraumatic.     Right Ear: External ear normal.     Left Ear: External ear normal.     Nose: Nose normal. No congestion.     Mouth/Throat:     Mouth: Mucous membranes are dry.     Pharynx: No oropharyngeal exudate.   Eyes:     General:        Right eye:  No discharge.        Left eye: No discharge.     Extraocular Movements: Extraocular movements intact.  Neck:     Comments: C3/4 ACDF and s/p epidural hematoma evacuation- incision looks OK Cardiovascular:     Rate and Rhythm: Normal rate and regular rhythm.     Heart sounds: Normal heart sounds. No murmur heard.   No gallop.  Pulmonary:     Comments: CTA B/L- no W/R/R- good air movement Abdominal:     Comments: Soft, but distended; hypoactive; NT   Genitourinary:    Comments: Foley in place- amber urine Musculoskeletal:     Comments: RUE- biceps 4/5, Triceps 4/5, WE 4-/5, grip 3/5, FA 2/5 LUE 4+/5 in same muscles RLE- HF 3-/5, KE/DF and PF 4/5 LLE- 4+/5 in same muscles   Skin:    General: Skin is warm and dry.     Comments: Surgical site clean and dry.  Neurological:     Comments: Patient is alert.  Speech is dysphonic but intelligible.  No acute distress and follows commands.  Oriented to person place and time.   Decreased to light touch in pinprick from C4 to S5- checked all levels/dermatomes and worse in L1-S1 on L  Psychiatric:        Behavior: Behavior normal.     Comments: Sad; flat affect      Lab Results Last 48 Hours  No results found for this or any previous visit (from the past 48 hour(s)).   Imaging Results (Last 48 hours)  No results found.           Medical Problem List and Plan: 1.  Cervical myelopathy secondary to epidural hematoma status post epidural hematoma 07/22/2021 after ACDF 07/19/2021.  Cervical collar as directed             -patient may  shower if dressing covered             -ELOS/Goals: 10-12 days min A to supervision 2.  Antithrombotics: -DVT/anticoagulation:  Mechanical: Sequential compression devices, below knee Bilateral lower extremities.  Check vascular study             -antiplatelet therapy: N/A 3. Pain Management: Robaxin/oxycodone as needed -will add Gabapentin 300 mg BID for now.  4.  Mood: Celexa 20 mg daily             -antipsychotic agents: N/A 5. Neuropsych: This patient is capable of making decisions on his own behalf. 6. Skin/Wound Care: Routine skin checks 7. Fluids/Electrolytes/Nutrition: Routine in and outs with follow-up chemistries 8.  Hypertension.  Lopressor 50 mg twice daily, lisinopril 10 mg daily, Demadex 20 mg twice daily.  Monitor with increased mobility 9.  BPH with urinary retention,likely neurogenic bladder.  Flomax 0.8 mg daily.  Keep foley for 1-2 days, then try to remove and check PVRs.  10.  Hyperlipidemia.  Crestor 11.  Hypothyroidism.  Synthroid 12.  CAD with CABG 2019.  Aspirin currently on hold 13.  Constipation/neurogenic bowel. Will change to Senokot and give Sorbitol 60cc x1 to get him to go.  14.  GERD.  Protonix     I have personally performed a face to face diagnostic evaluation of this patient and formulated the key components of the plan.  Additionally, I have personally reviewed laboratory data, imaging studies, as well as relevant notes and concur with the physician assistant's documentation above.   The patient's status has not changed from the original H&P.  Any changes in documentation  from the acute care chart have been noted above.     Lavon Paganini Angiulli, PA-C 07/30/2021

## 2021-08-01 NOTE — Plan of Care (Signed)
  Problem: Activity: Goal: Ability to avoid complications of mobility impairment will improve 08/01/2021 0857 by Katherina Right, RN Outcome: Progressing 08/01/2021 0857 by Katherina Right, RN Outcome: Progressing Goal: Ability to tolerate increased activity will improve 08/01/2021 0857 by Katherina Right, RN Outcome: Progressing 08/01/2021 0857 by Katherina Right, RN Outcome: Progressing Goal: Will remain free from falls 08/01/2021 0857 by Katherina Right, RN Outcome: Progressing 08/01/2021 0857 by Katherina Right, RN Outcome: Progressing   Problem: Safety: Goal: Ability to remain free from injury will improve 08/01/2021 0857 by Katherina Right, RN Outcome: Progressing 08/01/2021 0857 by Katherina Right, RN Outcome: Progressing   Problem: Education: Goal: Knowledge of General Education information will improve Description: Including pain rating scale, medication(s)/side effects and non-pharmacologic comfort measures 08/01/2021 0857 by Katherina Right, RN Outcome: Progressing 08/01/2021 0857 by Katherina Right, RN Outcome: Progressing   Problem: Health Behavior/Discharge Planning: Goal: Ability to manage health-related needs will improve 08/01/2021 0857 by Katherina Right, RN Outcome: Progressing 08/01/2021 0857 by Katherina Right, RN Outcome: Progressing   Problem: Clinical Measurements: Goal: Ability to maintain clinical measurements within normal limits will improve 08/01/2021 0857 by Katherina Right, RN Outcome: Progressing 08/01/2021 0857 by Katherina Right, RN Outcome: Progressing Goal: Will remain free from infection 08/01/2021 0857 by Katherina Right, RN Outcome: Progressing 08/01/2021 0857 by Katherina Right, RN Outcome: Progressing Goal: Diagnostic test results will improve 08/01/2021 0857 by Katherina Right, RN Outcome: Progressing 08/01/2021 0857 by Katherina Right, RN Outcome: Progressing Goal: Respiratory complications will improve 08/01/2021 0857 by Katherina Right, RN Outcome: Progressing 08/01/2021  0857 by Katherina Right, RN Outcome: Progressing Goal: Cardiovascular complication will be avoided 08/01/2021 0857 by Katherina Right, RN Outcome: Progressing 08/01/2021 0857 by Katherina Right, RN Outcome: Progressing   Problem: Activity: Goal: Risk for activity intolerance will decrease 08/01/2021 0857 by Katherina Right, RN Outcome: Progressing 08/01/2021 0857 by Katherina Right, RN Outcome: Progressing   Problem: Nutrition: Goal: Adequate nutrition will be maintained 08/01/2021 0857 by Katherina Right, RN Outcome: Progressing 08/01/2021 0857 by Katherina Right, RN Outcome: Progressing   Problem: Coping: Goal: Level of anxiety will decrease 08/01/2021 0857 by Katherina Right, RN Outcome: Progressing 08/01/2021 0857 by Katherina Right, RN Outcome: Progressing   Problem: Elimination: Goal: Will not experience complications related to bowel motility 08/01/2021 0857 by Katherina Right, RN Outcome: Progressing 08/01/2021 0857 by Katherina Right, RN Outcome: Progressing Goal: Will not experience complications related to urinary retention 08/01/2021 0857 by Katherina Right, RN Outcome: Progressing 08/01/2021 0857 by Katherina Right, RN Outcome: Progressing   Problem: Pain Managment: Goal: General experience of comfort will improve 08/01/2021 0857 by Katherina Right, RN Outcome: Progressing 08/01/2021 0857 by Katherina Right, RN Outcome: Progressing   Problem: Skin Integrity: Goal: Risk for impaired skin integrity will decrease 08/01/2021 0857 by Katherina Right, RN Outcome: Progressing 08/01/2021 0857 by Katherina Right, RN Outcome: Progressing

## 2021-08-02 ENCOUNTER — Ambulatory Visit: Payer: Medicare Other | Admitting: Family Medicine

## 2021-08-02 ENCOUNTER — Inpatient Hospital Stay (HOSPITAL_COMMUNITY): Payer: Medicare Other

## 2021-08-02 DIAGNOSIS — R2243 Localized swelling, mass and lump, lower limb, bilateral: Secondary | ICD-10-CM

## 2021-08-02 DIAGNOSIS — G8250 Quadriplegia, unspecified: Secondary | ICD-10-CM | POA: Diagnosis not present

## 2021-08-02 LAB — COMPREHENSIVE METABOLIC PANEL
ALT: 13 U/L (ref 0–44)
AST: 17 U/L (ref 15–41)
Albumin: 3.6 g/dL (ref 3.5–5.0)
Alkaline Phosphatase: 71 U/L (ref 38–126)
Anion gap: 12 (ref 5–15)
BUN: 34 mg/dL — ABNORMAL HIGH (ref 8–23)
CO2: 26 mmol/L (ref 22–32)
Calcium: 9.2 mg/dL (ref 8.9–10.3)
Chloride: 97 mmol/L — ABNORMAL LOW (ref 98–111)
Creatinine, Ser: 1.2 mg/dL (ref 0.61–1.24)
GFR, Estimated: >60 mL/min (ref >60)
Glucose, Bld: 100 mg/dL — ABNORMAL HIGH (ref 70–99)
Potassium: 4.3 mmol/L (ref 3.5–5.1)
Sodium: 135 mmol/L (ref 135–145)
Total Bilirubin: 0.8 mg/dL (ref 0.3–1.2)
Total Protein: 6.5 g/dL (ref 6.5–8.1)

## 2021-08-02 LAB — CBC WITH DIFFERENTIAL/PLATELET
Abs Immature Granulocytes: 0.07 10*3/uL (ref 0.00–0.07)
Basophils Absolute: 0.1 10*3/uL (ref 0.0–0.1)
Basophils Relative: 1 %
Eosinophils Absolute: 0.3 10*3/uL (ref 0.0–0.5)
Eosinophils Relative: 4 %
HCT: 38.1 % — ABNORMAL LOW (ref 39.0–52.0)
Hemoglobin: 12.8 g/dL — ABNORMAL LOW (ref 13.0–17.0)
Immature Granulocytes: 1 %
Lymphocytes Relative: 13 %
Lymphs Abs: 0.9 10*3/uL (ref 0.7–4.0)
MCH: 30.6 pg (ref 26.0–34.0)
MCHC: 33.6 g/dL (ref 30.0–36.0)
MCV: 91.1 fL (ref 80.0–100.0)
Monocytes Absolute: 0.6 10*3/uL (ref 0.1–1.0)
Monocytes Relative: 8 %
Neutro Abs: 5.2 10*3/uL (ref 1.7–7.7)
Neutrophils Relative %: 73 %
Platelets: 190 10*3/uL (ref 150–400)
RBC: 4.18 MIL/uL — ABNORMAL LOW (ref 4.22–5.81)
RDW: 12.3 % (ref 11.5–15.5)
WBC: 7.2 10*3/uL (ref 4.0–10.5)
nRBC: 0 % (ref 0.0–0.2)

## 2021-08-02 MED ORDER — CHLORHEXIDINE GLUCONATE CLOTH 2 % EX PADS
6.0000 | MEDICATED_PAD | Freq: Every day | CUTANEOUS | Status: DC
  Administered 2021-08-03: 6 via TOPICAL

## 2021-08-02 NOTE — Plan of Care (Signed)
Problem: RH Balance Goal: LTG: Patient will maintain dynamic sitting balance (OT) Description: LTG:  Patient will maintain dynamic sitting balance with assistance during activities of daily living (OT) Flowsheets (Taken 08/02/2021 1326) LTG: Pt will maintain dynamic sitting balance during ADLs with: Supervision/Verbal cueing Goal: LTG Patient will maintain dynamic standing with ADLs (OT) Description: LTG:  Patient will maintain dynamic standing balance with assist during activities of daily living (OT)  Flowsheets (Taken 08/02/2021 1326) LTG: Pt will maintain dynamic standing balance during ADLs with: Contact Guard/Touching assist   Problem: Sit to Stand Goal: LTG:  Patient will perform sit to stand in prep for activites of daily living with assistance level (OT) Description: LTG:  Patient will perform sit to stand in prep for activites of daily living with assistance level (OT) Flowsheets (Taken 08/02/2021 1326) LTG: PT will perform sit to stand in prep for activites of daily living with assistance level: Contact Guard/Touching assist   Problem: RH Eating Goal: LTG Patient will perform eating w/assist, cues/equip (OT) Description: LTG: Patient will perform eating with assist, with/without cues using equipment (OT) Flowsheets (Taken 08/02/2021 1326) LTG: Pt will perform eating with assistance level of: Set up assist    Problem: RH Grooming Goal: LTG Patient will perform grooming w/assist,cues/equip (OT) Description: LTG: Patient will perform grooming with assist, with/without cues using equipment (OT) Flowsheets (Taken 08/02/2021 1326) LTG: Pt will perform grooming with assistance level of: Set up assist    Problem: RH Bathing Goal: LTG Patient will bathe all body parts with assist levels (OT) Description: LTG: Patient will bathe all body parts with assist levels (OT) Flowsheets (Taken 08/02/2021 1326) LTG: Pt will perform bathing with assistance level/cueing: Minimal Assistance - Patient  > 75%   Problem: RH Dressing Goal: LTG Patient will perform upper body dressing (OT) Description: LTG Patient will perform upper body dressing with assist, with/without cues (OT). Flowsheets (Taken 08/02/2021 1326) LTG: Pt will perform upper body dressing with assistance level of: Set up assist Goal: LTG Patient will perform lower body dressing w/assist (OT) Description: LTG: Patient will perform lower body dressing with assist, with/without cues in positioning using equipment (OT) Flowsheets (Taken 08/02/2021 1326) LTG: Pt will perform lower body dressing with assistance level of: Minimal Assistance - Patient > 75%   Problem: RH Toileting Goal: LTG Patient will perform toileting task (3/3 steps) with assistance level (OT) Description: LTG: Patient will perform toileting task (3/3 steps) with assistance level (OT)  Flowsheets (Taken 08/02/2021 1326) LTG: Pt will perform toileting task (3/3 steps) with assistance level: Minimal Assistance - Patient > 75%   Problem: RH Functional Use of Upper Extremity Goal: LTG Patient will use RT/LT upper extremity as a (OT) Description: LTG: Patient will use right/left upper extremity as a stabilizer/gross assist/diminished/nondominant/dominant level with assist, with/without cues during functional activity (OT) Flowsheets (Taken 08/02/2021 1326) LTG: Use of upper extremity in functional activities: RUE as gross assist level LTG: Pt will use upper extremity in functional activity with assistance level of: Supervision/Verbal cueing   Problem: RH Toilet Transfers Goal: LTG Patient will perform toilet transfers w/assist (OT) Description: LTG: Patient will perform toilet transfers with assist, with/without cues using equipment (OT) Flowsheets (Taken 08/02/2021 1326) LTG: Pt will perform toilet transfers with assistance level of: Minimal Assistance - Patient > 75%   Problem: RH Tub/Shower Transfers Goal: LTG Patient will perform tub/shower transfers w/assist  (OT) Description: LTG: Patient will perform tub/shower transfers with assist, with/without cues using equipment (OT) Flowsheets (Taken 08/02/2021 1326) LTG: Pt will  perform tub/shower stall transfers with assistance level of: Minimal Assistance - Patient > 75%

## 2021-08-02 NOTE — Progress Notes (Signed)
Patient ID: [name], male   DOB: April 10, 1951, 71 y.o.   MRN: [id number] Met with the patient to introduce self and review role of the nurse CM. Reviewed rehab routine and plan of care. Patient noted preference for [facility] follow up instead of OP therapy at discharge due to burden on wife to drive. Handicapped accessible home. Reports inability to go home until walking and request foley to remain until more ambulatory. (Hx of suprapubic cath with lumbar surgery) Familiar with foley and pericare. Soft collar in place. Discussed pain management, skin care and ELOS. Continue to follow along to discharge to address educational needs and facilitate preparation for discharge. Margarito Liner

## 2021-08-02 NOTE — Evaluation (Signed)
Physical Therapy Assessment and Plan  Patient Details  Name: [name] MRN: [id number] Date of Birth: [birth year]  PT Diagnosis: Abnormality of gait, Difficulty walking, Impaired sensation, and Muscle weakness Rehab Potential: Good ELOS: 3 weeks   Today's Date: 08/02/2021 PT Individual Time: 0935-1050 PT Individual Time Calculation (min): 75 min    Hospital Problem: Principal Problem:   Acute incomplete quadriplegia ([place]) Active Problems:   Epidural hematoma   Past Medical History:  Past Medical History:  Diagnosis Date   Allergy    Carpal tunnel syndrome    Chronic back pain    Chronic combined systolic and diastolic congestive heart failure (HCC)    Chronic ITP (idiopathic thrombocytopenia) ([place]) 05/25/2015   Coronary artery disease    Coronary artery disease involving native coronary artery of native heart with angina pectoris (HCC)    Degenerative disc disease    GERD (gastroesophageal reflux disease)    History of thrombocytopenia    Hypercholesteremia    Hypertension    Hypothyroid    Lumbar pain    Myocardial infarction ([place]) 2009   Obesity    Pneumonia    around age 40   S/P CABG x 3 04/27/2018   LIMA to LAD SVG to OM1 SVG to OM2   Shortness of breath dyspnea    Past Surgical History:  Past Surgical History:  Procedure Laterality Date   ANTERIOR CERVICAL DECOMP/DISCECTOMY FUSION N/A 07/19/2021   Procedure: CERVICAL THREE-FOUR ANTERIOR CERVICAL DECOMPRESSION/DISCECTOMY FUSION;  Surgeon: [name], MD;  Location: [place];  Service: Neurosurgery;  Laterality: N/A;   ANTERIOR CERVICAL DECOMP/DISCECTOMY FUSION N/A 07/22/2021   Procedure: ANTERIOR CERVICAL DECOMPRESSION/DISCECTOMY REVISON FUSION  WITH EVACUATION OF EPIDURAL HEMATOMA;  Surgeon: [name], DO;  Location: [place];  Service: Neurosurgery;  Laterality: N/A;   BACK SURGERY     5 lumbas disc with cervical and lumbar fusions   BIOPSY N/A 03/06/2014   Procedure: ESOPHAGEAL BIOPSIES;  Surgeon: [name], MD;  Location: [facility];  Service: Endoscopy;  Laterality: N/A;   COLONOSCOPY     COLONOSCOPY WITH PROPOFOL N/A 03/06/2014   Procedure: COLONOSCOPY WITH PROPOFOL;  Surgeon: [name], MD;  Location: [facility];  Service: Endoscopy;  Laterality: N/A;  in cecum at 0807; total withdrawal time 9 minutes   CORONARY ANGIOPLASTY WITH STENT PLACEMENT     2000, and 2004 has 3 stents   CORONARY ARTERY BYPASS GRAFT N/A 04/27/2018   Procedure: CORONARY ARTERY BYPASS GRAFTING (CABG) x Three , using left internal mammary artery and right leg greater saphenous vein;  Surgeon: [name], MD;  Location: [place];  Service: Open Heart Surgery;  Laterality: N/A;   ESOPHAGOGASTRODUODENOSCOPY (EGD) WITH PROPOFOL N/A 03/06/2014   Procedure: ESOPHAGOGASTRODUODENOSCOPY (EGD) WITH PROPOFOL;  Surgeon: [name], MD;  Location: [facility];  Service: Endoscopy;  Laterality: N/A;   LEFT HEART CATH AND CORONARY ANGIOGRAPHY N/A 04/24/2018   Procedure: LEFT HEART CATH AND CORONARY ANGIOGRAPHY;  Surgeon: [name], MD;  Location: [place] [facility];  Service: Cardiovascular;  Laterality: N/A;   LUMBAR FUSION  2009   MALONEY DILATION N/A 03/06/2014   Procedure: MALONEY DILATION 54 french;  Surgeon: [name], MD;  Location: [facility];  Service: Endoscopy;  Laterality: N/A;   NECK SURGERY     TEE WITHOUT CARDIOVERSION N/A 04/27/2018   Procedure: TRANSESOPHAGEAL ECHOCARDIOGRAM (TEE);  Surgeon: [name], MD;  Location: [place];  Service: Open Heart Surgery;  Laterality: N/A;  Assessment & Plan Clinical Impression: Patient is a 70 year old right-handed male with history significant for ITP, hypertension, CAD with CABG 2019, hyperlipidemia.  Per chart review patient lives with spouse.  1 level home 3 steps to entry.  Independent with assistive device.  Patient with recent admission for C3/4 ACDF by 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  He was discharged  home 07/20/2021 ambulating 400 feet supervision without assistive device.  Presented 07/22/2021 with numbness tingling, lightening sensation and weakness going down his arms and legs while taking a shower.  Work-up and imaging showed epidural hematoma at C3-4 with cord compression.  Underwent revision arthrodesis C3-4 anterior interbody technique evacuation of epidural hematoma placement of intervertebral biomechanical device C3-4 07/22/2021 per [name].  Patient continues with cervical brace.  Tolerating a regular consistency diet.   Patient transferred to CIR on 08/01/2021 .   Patient currently requires mod with mobility secondary to muscle weakness, decreased cardiorespiratoy endurance, ataxia, and decreased sitting balance, decreased standing balance, decreased postural control, and decreased balance strategies.  Prior to hospitalization, patient was independent  with mobility and lived with Spouse in a House home.  Home access is 3Stairs to enter.  Patient will benefit from skilled PT intervention to maximize safe functional mobility, minimize fall risk, and decrease caregiver burden for planned discharge home with 24 hour supervision.  Anticipate patient will benefit from follow up [place] at discharge.  PT - End of Session Activity Tolerance: Tolerates 30+ min activity with multiple rests Endurance Deficit: Yes Endurance Deficit Description: Requires rest breaks during functional mobility PT Assessment Rehab Potential (ACUTE/IP ONLY): Good PT Patient demonstrates impairments in the following area(s): Balance;Endurance;Motor;Pain;Perception;Safety;Sensory PT Transfers Functional Problem(s): Bed Mobility;Bed to Chair;Car;Furniture PT Locomotion Functional Problem(s): Ambulation;Wheelchair Mobility;Stairs PT Plan PT Intensity: Minimum of 1-2 x/day ,45 to 90 minutes PT Frequency: 5 out of 7 days PT Duration Estimated Length of Stay: 3 weeks PT Treatment/Interventions: Ambulation/gait  training;Community reintegration;DME/adaptive equipment instruction;Neuromuscular re-education;Psychosocial support;Stair training;UE/LE Strength taining/ROM;Wheelchair propulsion/positioning;Balance/vestibular training;Discharge planning;Pain management;Functional electrical stimulation;Skin care/wound management;UE/LE Coordination activities;Therapeutic Activities;Cognitive remediation/compensation;Disease management/prevention;Functional mobility training;Patient/family education;Splinting/orthotics;Therapeutic Exercise PT Transfers Anticipated Outcome(s): Supervision PT Locomotion Anticipated Outcome(s): Supervision PT Recommendation Follow Up Recommendations: Home health PT Patient destination: Home Equipment Recommended: To be determined   PT Evaluation Precautions/Restrictions Precautions Precautions: Fall;Cervical Required Braces or Orthoses: Cervical Brace Cervical Brace: Soft collar Restrictions Weight Bearing Restrictions: No General Chart Reviewed: Yes Family/Caregiver Present: No  Pain   Pain Interference Pain Interference Pain Effect on Sleep: 2. Occasionally Pain Interference with Therapy Activities: 2. Occasionally Pain Interference with Day-to-Day Activities: 3. Frequently Home Living/Prior Functioning Home Living Living Arrangements: Spouse/significant other Available Help at Discharge: Family;Available 24 hours/day Type of Home: House Home Access: Stairs to enter [employer] of Steps: 3 Entrance Stairs-Rails: Left Home Layout: One level Bathroom Shower/Tub: Multimedia programmer: Handicapped height  Lives With: Spouse Prior Function  Able to Take Stairs?: Yes Vision/Perception  Vision - History Ability to See in Adequate Light: 0 Adequate Perception Perception: Within Functional Limits Praxis Praxis: Intact  Cognition Overall Cognitive Status: Within Functional Limits for tasks assessed Arousal/Alertness:  Awake/alert Orientation Level: Oriented X4 Year: 2022 Month: October Day of Week: Correct Memory: Appears intact Immediate Memory Recall: Sock;Blue;Bed Memory Recall Sock: Without Cue Memory Recall Blue: Without Cue Memory Recall Bed: Without Cue Safety/Judgment: Appears intact Sensation Sensation Light Touch: Impaired by gross assessment Stereognosis: Impaired by gross assessment Additional Comments: Light touch present but diminished in biltareal lower extremities Coordination Gross Motor  Movements are Fluid and Coordinated: No Fine Motor Movements are Fluid and Coordinated: No Finger Nose Finger Test: can achieve on L hand with decent speed but incoordinated with reaching to nose;  too painful on RUE - able to touch chin but can not extend arms out. Motor  Motor Motor: Ataxia Motor - Skilled Clinical Observations: Llikely due to lack of sensation/proprioception   Trunk/Postural Assessment  Cervical Assessment Cervical Assessment:  (forward head) Thoracic Assessment Thoracic Assessment:  (rounded shoulders) Lumbar Assessment Lumbar Assessment:  (posterior pelvic tilt) Postural Control Postural Control: Deficits on evaluation Trunk Control: Impaired due to weakness  Balance Balance Balance Assessed: Yes Static Sitting Balance Static Sitting - Balance Support: Feet supported Static Sitting - Level of Assistance: 5: Stand by assistance Dynamic Sitting Balance Dynamic Sitting - Balance Support: Feet supported Dynamic Sitting - Level of Assistance: 4: Min assist Sitting balance - Comments: weak trunk, poor postural control Static Standing Balance Static Standing - Balance Support: During functional activity;Bilateral upper extremity supported Static Standing - Level of Assistance: 3: Mod assist Dynamic Standing Balance Dynamic Standing - Balance Support: During functional activity;Bilateral upper extremity supported Dynamic Standing - Level of Assistance: 3: Mod  assist Extremity Assessment  RUE Assessment Passive Range of Motion (PROM) Comments: limited sh flex/abd due to neck pain Active Range of Motion (AROM) Comments: limited elbow extension,  sh flexion 20, sh abd 60, General Strength Comments: grasp 3/5, sh 2-/5, elbow flex 3+/5, elbow ext 2+/5 LUE Assessment Passive Range of Motion (PROM) Comments: WFL Active Range of Motion (AROM) Comments: full AROM but achieving AROM is not smooth or coordination General Strength Comments: sh not tested for spinal prec; elbow flex and ext 4-, grasp 4- RLE Assessment General Strength Comments: Grossly 3-/5 LLE Assessment General Strength Comments: Grossly 3+/5  Care Tool Care Tool Bed Mobility Roll left and right activity   Roll left and right assist level: Minimal Assistance - Patient > 75%    Sit to lying activity   Sit to lying assist level: Moderate Assistance - Patient 50 - 74%    Lying to sitting on side of bed activity   Lying to sitting on side of bed assist level: the ability to move from lying on the back to sitting on the side of the bed with no back support.: Moderate Assistance - Patient 50 - 74%     Care Tool Transfers Sit to stand transfer   Sit to stand assist level: Moderate Assistance - Patient 50 - 74%    Chair/bed transfer   Chair/bed transfer assist level: Moderate Assistance - Patient 50 - 74%     Toilet transfer   Assist Level: Maximal Assistance - Patient 24 - 49%    Car transfer   Car transfer assist level: Maximal Assistance - Patient 25 - 49%      Care Tool Locomotion Ambulation   Assist level: 2 helpers Assistive device: Parallel bars Max distance: 5'  Walk 10 feet activity Walk 10 feet activity did not occur: Safety/medical concerns       Walk 50 feet with 2 turns activity Walk 50 feet with 2 turns activity did not occur: Safety/medical concerns      Walk 150 feet activity Walk 150 feet activity did not occur: Safety/medical concerns      Walk 10  feet on uneven surfaces activity Walk 10 feet on uneven surfaces activity did not occur: Safety/medical concerns      Stairs Stair activity did not occur: Safety/medical concerns  Walk up/down 1 step activity Walk up/down 1 step or curb (drop down) activity did not occur: Safety/medical concerns     Walk up/down 4 steps activity did not occuR: Safety/medical concerns  Walk up/down 4 steps activity      Walk up/down 12 steps activity Walk up/down 12 steps activity did not occur: Safety/medical concerns      Pick up small objects from floor Pick up small object from the floor (from standing position) activity did not occur: Safety/medical concerns      Wheelchair Is the patient using a wheelchair?: Yes Type of Wheelchair: Manual   Wheelchair assist level: Supervision/Verbal cueing Max wheelchair distance: 20'  Wheel 50 feet with 2 turns activity   Assist Level: Minimal Assistance - Patient > 75%  Wheel 150 feet activity   Assist Level: Moderate Assistance - Patient 50 - 74%    Refer to Care Plan for Long Term Goals  SHORT TERM GOAL WEEK 1 PT Short Term Goal 1 (Week 1): Pt will perform bed mobility consistently with minA. PT Short Term Goal 2 (Week 1): Pt wil perform sit to stand transfer consistently with minA. PT Short Term Goal 3 (Week 1): Pt will performs bed to chair transfer consistently with minA. PT Short Term Goal 4 (Week 1): Pt will ambulate x25' with modA +1 and LRAD.  Recommendations for other services: None   Skilled Therapeutic Intervention  Evaluation completed (see details above and below) with education on PT POC and goals and individual treatment initiated with focus on bed mobility, balance, transfers, and ambulation. Pt received supine in bed and agrees to therapy. Reports pain in neck, 6/10 in severity. PT provides repositioning and rest breaks as needed to manage pain. Pt noted to have been incontinent of bowel in bed. Pt performs bilateral rolling  with minA and use of bed rails to assist with cleaning. PT provides verbal and tactile cues for logrolling technique to maintain spinal precautions. Pt performs supine to sit with modA and cues for sequencing. Pt performs sit to stand and stand step transfer to [facility] with modA and cues for initiation, body mechanics, sequencing, and positioning. WC transport to gym for time management. Pt completes car transfer with maxA and stand step technique, losing balance and reaching for door for support. PT educates on safety and pt verbalizes understanding. Following extended seated rest break, pt performs x2 reps of sit to stand in parallel bars with minA/modA, with mirror for visual feedback and multimodal cues for posture and balance. Pt noted to have difficulty gripping bar with R hand due to impairments in sensation. Pt ambulates x5' with modA +1 and +2 WC follow for safety. Pt takes very short, slightly ataxic steps but does not demo any buckling. Pt left seated in WC with alarm intact and all needs within reach.   Mobility Bed Mobility Bed Mobility: Supine to Sit;Rolling Right;Rolling Left;Sit to Supine Rolling Right: Minimal Assistance - Patient > 75% Rolling Left: Minimal Assistance - Patient > 75% Supine to Sit: Moderate Assistance - Patient 50-74% Sit to Supine: Moderate Assistance - Patient 50-74% Transfers Transfers: Sit to Stand;Stand Pivot Transfers;Stand to Sit Sit to Stand: Moderate Assistance - Patient 50-74% Stand to Sit: Moderate Assistance - Patient 50-74% Stand Pivot Transfers: Moderate Assistance - Patient 50 - 74% Stand Pivot Transfer Details: Tactile cues for initiation;Verbal cues for sequencing;Verbal cues for technique;Tactile cues for weight beaing;Tactile cues for weight shifting;Tactile cues for sequencing Transfer (Assistive device): 1 person hand held assist Locomotion  Gait Ambulation: Yes Gait Assistance: 2 Helpers Gait Distance (Feet): 5 Feet Assistive device: Parallel  bars Gait Assistance Details: Tactile cues for initiation;Tactile cues for posture;Verbal cues for sequencing;Verbal cues for gait pattern;Verbal cues for technique;Tactile cues for sequencing;Tactile cues for placement Gait Gait: Yes Gait Pattern: Impaired Gait Pattern:  (ataxic gait pattern) Gait velocity: decreased Stairs / Additional Locomotion Stairs: No Wheelchair Mobility Wheelchair Mobility: Yes Wheelchair Assistance: Chartered loss adjuster: Both upper extremities Wheelchair Parts Management: Needs assistance Distance: 20'   Discharge Criteria: Patient will be discharged from PT if patient refuses treatment 3 consecutive times without medical reason, if treatment goals not met, if there is a change in medical status, if patient makes no progress towards goals or if patient is discharged from hospital.  The above assessment, treatment plan, treatment alternatives and goals were discussed and mutually agreed upon: by patient  Breck Coons, PT, DPT 08/02/2021, 12:43 PM

## 2021-08-02 NOTE — Progress Notes (Signed)
Inpatient Rehabilitation  Patient information reviewed and entered into eRehab system by Makari Portman Matisha Termine, OTR/L.   Information including medical coding, functional ability and quality indicators will be reviewed and updated through discharge.    

## 2021-08-02 NOTE — Progress Notes (Signed)
Inpatient Rehabilitation Care Coordinator Assessment and Plan Patient Details  Name: [name] MRN: [id number] Date of Birth: [birth year]  Today's Date: 08/02/2021  Hospital Problems: Principal Problem:   Acute incomplete quadriplegia [name]) Active Problems:   Epidural hematoma  Past Medical History:  Past Medical History:  Diagnosis Date   Allergy    Carpal tunnel syndrome    Chronic back pain    Chronic combined systolic and diastolic congestive heart failure (HCC)    Chronic ITP (idiopathic thrombocytopenia) ([place]) 05/25/2015   Coronary artery disease    Coronary artery disease involving native coronary artery of native heart with angina pectoris (HCC)    Degenerative disc disease    GERD (gastroesophageal reflux disease)    History of thrombocytopenia    Hypercholesteremia    Hypertension    Hypothyroid    Lumbar pain    Myocardial infarction ([place]) 2009   Obesity    Pneumonia    around age 9   S/P CABG x 3 04/27/2018   LIMA to LAD SVG to OM1 SVG to OM2   Shortness of breath dyspnea    Past Surgical History:  Past Surgical History:  Procedure Laterality Date   ANTERIOR CERVICAL DECOMP/DISCECTOMY FUSION N/A 07/19/2021   Procedure: CERVICAL THREE-FOUR ANTERIOR CERVICAL DECOMPRESSION/DISCECTOMY FUSION;  Surgeon: Oscar Kos, MD;  Location: [facility];  Service: Neurosurgery;  Laterality: N/A;   ANTERIOR CERVICAL DECOMP/DISCECTOMY FUSION N/A 07/22/2021   Procedure: ANTERIOR CERVICAL DECOMPRESSION/DISCECTOMY REVISON FUSION  WITH EVACUATION OF EPIDURAL HEMATOMA;  Surgeon: Oscar Ro, DO;  Location: [facility];  Service: Neurosurgery;  Laterality: N/A;   BACK SURGERY     5 lumbas disc with cervical and lumbar fusions   BIOPSY N/A 03/06/2014   Procedure: ESOPHAGEAL BIOPSIES;  Surgeon: Oscar Houston, MD;  [facility]: [facility];  Service: Endoscopy;  Laterality: N/A;   COLONOSCOPY     COLONOSCOPY WITH PROPOFOL N/A 03/06/2014   Procedure: COLONOSCOPY WITH PROPOFOL;  Surgeon: Oscar Houston, MD;  Location: [facility];  Service: Endoscopy;  Laterality: N/A;  in cecum at 0807; total withdrawal time 9 minutes   CORONARY ANGIOPLASTY WITH STENT PLACEMENT     2000, and 2004 has 3 stents   CORONARY ARTERY BYPASS GRAFT N/A 04/27/2018   Procedure: CORONARY ARTERY BYPASS GRAFTING (CABG) x Three , using left internal mammary artery and right leg greater saphenous vein;  Surgeon: Oscar Alberts, MD;  Location: [facility];  Service: Open Heart Surgery;  Laterality: N/A;   ESOPHAGOGASTRODUODENOSCOPY (EGD) WITH PROPOFOL N/A 03/06/2014   Procedure: ESOPHAGOGASTRODUODENOSCOPY (EGD) WITH PROPOFOL;  Surgeon: Oscar Houston, MD;  [facility]: [facility];  Service: Endoscopy;  Laterality: N/A;   LEFT HEART CATH AND CORONARY ANGIOGRAPHY N/A 04/24/2018   Procedure: LEFT HEART CATH AND CORONARY ANGIOGRAPHY;  Surgeon: Oscar Booze, MD;  Location: [facility];  Service: Cardiovascular;  Laterality: N/A;   LUMBAR FUSION  2009   MALONEY DILATION N/A 03/06/2014   Procedure: MALONEY DILATION 54 french;  Surgeon: Oscar Houston, MD;  [facility]: [facility];  Service: Endoscopy;  Laterality: N/A;   NECK SURGERY     TEE WITHOUT CARDIOVERSION N/A 04/27/2018   Procedure: TRANSESOPHAGEAL ECHOCARDIOGRAM (TEE);  Surgeon: Oscar Alberts, MD;  Location: [facility];  Service: Open Heart Surgery;  Laterality: N/A;   Social History:  reports that he quit smoking about 30 years ago. His smoking use included cigarettes. He has a 40.00 pack-year smoking history. He has never used smokeless tobacco. He reports  current alcohol use. He reports that he does not use drugs.  Family / Support Systems Marital Status: Married Patient Roles: Spouse, Parent Spouse/Significant Other: [name] [phone] Children: [name] [phone] Other Supports: Another child Anticipated Caregiver: [name] Ability/Limitations of Caregiver: Can do min assist in good health Caregiver Availability: 24/7 Family Dynamics: Close knit family who are  involved and supportive. Wife can provide assist if needed. Pt was doing well until this second event/complication  Social History Preferred language: English Religion: Christian Cultural Background: No issues Education: [place] - How often do you need to have someone help you when you read instructions, pamphlets, or other written material from your doctor or pharmacy?: Never Writes: Yes Employment Status: Retired Public relations account executive Issues: No issues Guardian/Conservator: None-according to MD pt is capable of making his own decisions while here   Abuse/Neglect Abuse/Neglect Assessment Can Be Completed: Yes Physical Abuse: Denies Verbal Abuse: Denies Sexual Abuse: Denies Exploitation of patient/patient's resources: Denies Self-Neglect: Denies  Patient response to: Social Isolation - How often do you feel lonely or isolated from those around you?: Never  Emotional Status Pt's affect, behavior and adjustment status: Pt is motivated to recover from this hematoma and get back to doing well like he was prior to this. He felt great after first surgery and hopes to do so again and be mod/i by discharge. Recent Psychosocial Issues: other health issues Psychiatric History: No history deferred depression screening due to coping appropriately Substance Abuse History: No issues  Patient / Family Perceptions, Expectations & Goals Pt/Family understanding of illness & functional limitations: Pt and wife can explain his surgeries and issues, both are optimistic he will do well here and regain his independence. He talks with the surgeon and feels he has a good understanding of his plan moving forward. Premorbid pt/family roles/activities: husband, father, grandfather, retiree, home owner, etc Anticipated changes in roles/activities/participation: resume Pt/family expectations/goals: Pt states: " I am hopeful I will get back to the level I was before the second surgery."  Wife  states: " I hope he gets mobile like he was before this happened."  [employer]: None Premorbid Home Care/DME Agencies: Other (Comment) (rw, rollator, tub seat and grab bars) Transportation available at discharge: Wife Is the patient able to respond to transportation needs?: Yes In the past 12 months, has lack of transportation kept you from medical appointments or from getting medications?: No In the past 12 months, has lack of transportation kept you from meetings, work, or from getting things needed for daily living?: No  Discharge Planning Living Arrangements: Spouse/significant other Support Systems: Spouse/significant other, Children, Friends/neighbors, Church/faith community Type of Residence: Private residence Insurance Resources: Multimedia programmer (specify) Primary school teacher) Financial Resources: Radio broadcast assistant Screen Referred: No Living Expenses: Own Money Management: Patient, Spouse Does the patient have any problems obtaining your medications?: No Home Management: Wife Patient/Family Preliminary Plans: Return home with wife who is able to assist if needed. Both are feeling good about being on rehab and getting better. Will await team evaluations Care Coordinator Anticipated Follow Up Needs: HH/OP  Clinical Impression Pleasant gentleman who is motivated to do well and recover from this second surgery. He has a supportive wife who is willing to assist. Will await therapy evaluations and work on discharge needs.  Elease Hashimoto 08/02/2021, 9:45 AM

## 2021-08-02 NOTE — Evaluation (Signed)
Occupational Therapy Assessment and Plan  Patient Details  Name: [name] MRN: [id number] Date of Birth: [birth year]  OT Diagnosis: acute pain, ataxia, muscle weakness (generalized), and quadriparesis at level C3-C4 Rehab Potential: Rehab Potential (ACUTE ONLY): Good ELOS: 21 days   Today's Date: 08/02/2021 OT Individual Time: 1100-1210 OT Individual Time Calculation (min): 70 min     Hospital Problem: Principal Problem:   Acute incomplete quadriplegia ([place]) Active Problems:   Epidural hematoma   Past Medical History:  Past Medical History:  Diagnosis Date   Allergy    Carpal tunnel syndrome    Chronic back pain    Chronic combined systolic and diastolic congestive heart failure (HCC)    Chronic ITP (idiopathic thrombocytopenia) (HCC) 05/25/2015   Coronary artery disease    Coronary artery disease involving native coronary artery of native heart with angina pectoris (HCC)    Degenerative disc disease    GERD (gastroesophageal reflux disease)    History of thrombocytopenia    Hypercholesteremia    Hypertension    Hypothyroid    Lumbar pain    Myocardial infarction ([place]) 2009   Obesity    Pneumonia    around age 21   S/P CABG x 3 04/27/2018   LIMA to LAD SVG to OM1 SVG to OM2   Shortness of breath dyspnea    Past Surgical History:  Past Surgical History:  Procedure Laterality Date   ANTERIOR CERVICAL DECOMP/DISCECTOMY FUSION N/A 07/19/2021   Procedure: CERVICAL THREE-FOUR ANTERIOR CERVICAL DECOMPRESSION/DISCECTOMY FUSION;  Surgeon: [name], MD;  Location: [facility];  Service: Neurosurgery;  Laterality: N/A;   ANTERIOR CERVICAL DECOMP/DISCECTOMY FUSION N/A 07/22/2021   Procedure: ANTERIOR CERVICAL DECOMPRESSION/DISCECTOMY REVISON FUSION  WITH EVACUATION OF EPIDURAL HEMATOMA;  Surgeon: [name], DO;  Location: [facility];  Service: Neurosurgery;  Laterality: N/A;   BACK SURGERY     5 lumbas disc with cervical and lumbar fusions   BIOPSY N/A 03/06/2014   Procedure:  ESOPHAGEAL BIOPSIES;  Surgeon: [name], MD;  [facility];  Service: Endoscopy;  Laterality: N/A;   COLONOSCOPY     COLONOSCOPY WITH PROPOFOL N/A 03/06/2014   Procedure: COLONOSCOPY WITH PROPOFOL;  Surgeon: [name], MD;  [facility];  Service: Endoscopy;  Laterality: N/A;  in cecum at 0807; total withdrawal time 9 minutes   CORONARY ANGIOPLASTY WITH STENT PLACEMENT     2000, and 2004 has 3 stents   CORONARY ARTERY BYPASS GRAFT N/A 04/27/2018   Procedure: CORONARY ARTERY BYPASS GRAFTING (CABG) x Three , using left internal mammary artery and right leg greater saphenous vein;  Surgeon: [name], MD;  Location: [facility];  Service: Open Heart Surgery;  Laterality: N/A;   ESOPHAGOGASTRODUODENOSCOPY (EGD) WITH PROPOFOL N/A 03/06/2014   Procedure: ESOPHAGOGASTRODUODENOSCOPY (EGD) WITH PROPOFOL;  Surgeon: [name], MD;  [facility];  Service: Endoscopy;  Laterality: N/A;   LEFT HEART CATH AND CORONARY ANGIOGRAPHY N/A 04/24/2018   Procedure: LEFT HEART CATH AND CORONARY ANGIOGRAPHY;  Surgeon: [name], MD;  Location: [facility];  Service: Cardiovascular;  Laterality: N/A;   LUMBAR FUSION  2009   MALONEY DILATION N/A 03/06/2014   Procedure: MALONEY DILATION 54 french;  Surgeon: [name], MD;  [facility];  Service: Endoscopy;  Laterality: N/A;   NECK SURGERY     TEE WITHOUT CARDIOVERSION N/A 04/27/2018   Procedure: TRANSESOPHAGEAL ECHOCARDIOGRAM (TEE);  Surgeon: [name], MD;  Location: [facility];  Service: Open  Heart Surgery;  Laterality: N/A;    Assessment & Plan Clinical Impression:   [name] is a 70 year old right-handedd male with history significant for ITP, hypertension, CAD with CABG 2019, hyperlipidemia.  Per chart review patient lives with spouse.  1 level home 3 steps to entry.  Independent with assistive device.  Patient with recent admission for C3/4 ACDF by [name] 07/19/2021 for cervical spondylitic myelopathy severe  stenosis cord compression.  He was discharged home 07/20/2021 ambulating 400 feet supervision without assistive device.  Presented 07/22/2021 with numbness tingling, lightening sensation and weakness going down his arms and legs while taking a shower.  Work-up and imaging showed epidural hematoma at C3-4 with cord compression.  Underwent revision arthrodesis C3-4 anterior interbody technique evacuation of epidural hematoma placement of intervertebral biomechanical device C3-4 07/22/2021 per [name].  Patient continues with cervical brace.  Tolerating a regular consistency diet.  Therapy evaluations completed due to patient decreased functional mobility was admitted for a comprehensive rehab program. Patient transferred to CIR on 08/01/2021 .    Patient currently requires total with basic self-care skills secondary to muscle weakness and muscle joint tightness, unbalanced muscle activation, ataxia, and decreased coordination, and decreased sitting balance, decreased standing balance, decreased postural control, and decreased balance strategies.  Prior to hospitalization, patient could complete ADLs with modified independent .  Patient will benefit from skilled intervention to increase independence with basic self-care skills prior to discharge home with care partner.  Anticipate patient will require minimal physical assistance and follow up home health.  OT - End of Session Activity Tolerance: Tolerates 10 - 20 min activity with multiple rests Endurance Deficit: Yes Endurance Deficit Description: Requires rest breaks during functional mobility OT Assessment Rehab Potential (ACUTE ONLY): Good OT Patient demonstrates impairments in the following area(s): Balance;Endurance;Motor;Pain;Sensory OT Basic ADL's Functional Problem(s): Eating;Grooming;Bathing;Dressing;Toileting OT Transfers Functional Problem(s): Toilet;Tub/Shower OT Additional Impairment(s): Fuctional Use of Upper Extremity OT Plan OT  Intensity: Minimum of 1-2 x/day, 45 to 90 minutes OT Frequency: 5 out of 7 days OT Duration/Estimated Length of Stay: 21 days OT Treatment/Interventions: Balance/vestibular training;DME/adaptive equipment instruction;Disease mangement/prevention;Discharge planning;Functional mobility training;Neuromuscular re-education;Psychosocial support;Patient/family education;Pain management;Self Care/advanced ADL retraining;UE/LE Strength taining/ROM;Therapeutic Exercise;Therapeutic Activities;UE/LE Coordination activities OT Self Feeding Anticipated Outcome(s): set up OT Basic Self-Care Anticipated Outcome(s): min A OT Toileting Anticipated Outcome(s): min A OT Bathroom Transfers Anticipated Outcome(s): min A OT Recommendation Patient destination: Home Follow Up Recommendations: Home health OT Equipment Recommended: To be determined   OT Evaluation Precautions/Restrictions  Precautions Precautions: Fall;Cervical Required Braces or Orthoses: Cervical Brace Cervical Brace: Soft collar Restrictions Weight Bearing Restrictions: No Other Position/Activity Restrictions: per MD orders, collar can be removed for shower, in bed and getting in the bathroom  Pain Pain Assessment Pain Score: 5  Pain Type: Neuropathic pain Pain Location: Neck Pain Orientation: Left;Right Pain Descriptors / Indicators: Shooting Pain Onset: On-going Pain Intervention(s):  (RN provided medication) Home Living/Prior Functioning Home Living Family/patient expects to be discharged to:: Private residence Living Arrangements: Spouse/significant other Available Help at Discharge: Family, Available 24 hours/day Type of Home: House Home Access: Stairs to enter Technical brewer of Steps: 3 Entrance Stairs-Rails: Left Home Layout: One level Bathroom Shower/Tub: Multimedia programmer: Handicapped height  Lives With: Spouse Prior Function Level of Independence: Independent with basic ADLs, Independent with  gait, Independent with transfers, Requires assistive device for independence  Able to Take Stairs?: Yes Driving: Yes Vocation: Retired Comments: usually would fall from initial standing, used rollator for longer distances, RW in  the home Vision Baseline Vision/History: 1 Wears glasses Ability to See in Adequate Light: 0 Adequate Patient Visual Report:  (history of R eye strabismus) Vision Assessment?: No apparent visual deficits (pt states occasional double vision, but it is resolving) Perception  Perception: Within Functional Limits Praxis Praxis: Intact Cognition Overall Cognitive Status: Within Functional Limits for tasks assessed Arousal/Alertness: Awake/alert Orientation Level: Person;Place;Situation Person: Oriented Place: Oriented Situation: Oriented Year: 2022 Month: October Day of Week: Correct Memory: Appears intact Immediate Memory Recall: Sock;Blue;Bed Memory Recall Sock: Without Cue Memory Recall Blue: Without Cue Memory Recall Bed: Without Cue Safety/Judgment: Appears intact Sensation Sensation Light Touch: Impaired by gross assessment Hot/Cold: Appears Intact Proprioception: Impaired by gross assessment Stereognosis: Impaired by gross assessment Additional Comments: Light touch present but diminished in biltareal lower extremities Coordination Gross Motor Movements are Fluid and Coordinated: No Fine Motor Movements are Fluid and Coordinated: No Finger Nose Finger Test: can achieve on L hand with decent speed but incoordinated with reaching to nose;  too painful on RUE - able to touch chin but can not extend arms out. Motor  Motor Motor: Ataxia Motor - Skilled Clinical Observations: Llikely due to lack of sensation/proprioception/strength  Trunk/Postural Assessment  Cervical Assessment Cervical Assessment:  (forward head) Thoracic Assessment Thoracic Assessment:  (rounded shoulders) Lumbar Assessment Lumbar Assessment:  (posterior pelvic  tilt) Postural Control Postural Control: Deficits on evaluation Trunk Control: Impaired due to weakness  Balance Balance Balance Assessed: Yes Static Sitting Balance Static Sitting - Balance Support: Feet supported Static Sitting - Level of Assistance: 5: Stand by assistance Dynamic Sitting Balance Dynamic Sitting - Balance Support: Feet supported Dynamic Sitting - Level of Assistance: 4: Min assist Sitting balance - Comments: weak trunk, poor postural control Static Standing Balance Static Standing - Balance Support: During functional activity;Bilateral upper extremity supported Static Standing - Level of Assistance: 3: Mod assist Dynamic Standing Balance Dynamic Standing - Balance Support: During functional activity;Bilateral upper extremity supported Dynamic Standing - Level of Assistance: 2: Max assist Extremity/Trunk Assessment RUE Assessment Passive Range of Motion (PROM) Comments: limited sh flex/abd due to neck pain Active Range of Motion (AROM) Comments: limited elbow extension,  sh flexion 20, sh abd 60, General Strength Comments: grasp 3/5, sh 2-/5, elbow flex 3+/5, elbow ext 2+/5 LUE Assessment Passive Range of Motion (PROM) Comments: WFL Active Range of Motion (AROM) Comments: full AROM but achieving AROM is not smooth or coordination General Strength Comments: sh not tested for spinal prec; elbow flex and ext 4-, grasp 4-  Care Tool Care Tool Self Care Eating   Eating Assist Level: Minimal Assistance - Patient > 75%    Oral Care    Oral Care Assist Level: Moderate Assistance - Patient 50 - 74%    Bathing   Body parts bathed by patient: Chest;Abdomen;Right arm;Right upper leg;Left upper leg;Face Body parts bathed by helper: Left arm;Front perineal area;Buttocks;Right lower leg;Left lower leg   Assist Level: Moderate Assistance - Patient 50 - 74%    Upper Body Dressing(including orthotics)   What is the patient wearing?: Pull over shirt   Assist Level:  Maximal Assistance - Patient 25 - 49%    Lower Body Dressing (excluding footwear)   What is the patient wearing?: Incontinence brief;Pants Assist for lower body dressing: Total Assistance - Patient < 25%    Putting on/Taking off footwear   What is the patient wearing?: Non-skid slipper socks Assist for footwear: Dependent - Patient 0%       Care Tool Toileting Toileting activity  Assist for toileting: Dependent - Patient 0%     Care Tool Bed Mobility Roll left and right activity   Roll left and right assist level: Minimal Assistance - Patient > 75%    Sit to lying activity   Sit to lying assist level: Moderate Assistance - Patient 50 - 74%    Lying to sitting on side of bed activity   Lying to sitting on side of bed assist level: the ability to move from lying on the back to sitting on the side of the bed with no back support.: Moderate Assistance - Patient 50 - 74%     Care Tool Transfers Sit to stand transfer   Sit to stand assist level: Moderate Assistance - Patient 50 - 74%    Chair/bed transfer   Chair/bed transfer assist level: Moderate Assistance - Patient 50 - 74%     Toilet transfer   Assist Level: Maximal Assistance - Patient 24 - 49%     Care Tool Cognition  Expression of Ideas and Wants Expression of Ideas and Wants: 4. Without difficulty (complex and basic) - expresses complex messages without difficulty and with speech that is clear and easy to understand  Understanding Verbal and Non-Verbal Content Understanding Verbal and Non-Verbal Content: 4. Understands (complex and basic) - clear comprehension without cues or repetitions   Memory/Recall Ability Memory/Recall Ability : That he or she is in a hospital/hospital unit;Staff names and faces;Location of own room;Current season   Refer to Care Plan for Long Term Goals  SHORT TERM GOAL WEEK 1 OT Short Term Goal 1 (Week 1): Pt will be able to sit to stand to RW with mod A of 1 and hold stand for 30 sec in  prep for LB self care. OT Short Term Goal 2 (Week 1): Pt will demonstrate improved UE strength to don tshirt with min A OT Short Term Goal 3 (Week 1): Pt will be able to don shorts over feet with mod A. OT Short Term Goal 4 (Week 1): Pt will be able to use R hand to wash L arm with min A.  Recommendations for other services: None    Skilled Therapeutic Intervention ADL ADL Eating: Minimal assistance Grooming: Moderate assistance Upper Body Bathing: Moderate assistance Where Assessed-Upper Body Bathing: Wheelchair Lower Body Bathing: Moderate assistance Where Assessed-Lower Body Bathing: Wheelchair Upper Body Dressing: Maximal assistance Where Assessed-Upper Body Dressing: Wheelchair Lower Body Dressing: Dependent Where Assessed-Lower Body Dressing: Wheelchair Toileting: Dependent Where Assessed-Toileting: Bedside Commode Toilet Transfer: Maximal assistance Toilet Transfer Method: Other (comment) (STEDY) Mobility  Bed Mobility Bed Mobility: Supine to Sit;Rolling Right;Rolling Left;Sit to Supine Rolling Right: Minimal Assistance - Patient > 75% Rolling Left: Minimal Assistance - Patient > 75% Supine to Sit: Moderate Assistance - Patient 50-74% Sit to Supine: Moderate Assistance - Patient 50-74% Transfers Sit to Stand: Moderate Assistance - Patient 50-74% Stand to Sit: Moderate Assistance - Patient 50-74%  Pt seen for initial evaluation. Pt received in wc and discussed role of OT and his goals.   "I can hire people to [place] my yard, I just want to be able to get to the toilet safely"  Discussed how the primary focus of inpt OT is on basic self care to get him to the highest level of independence.  Pt has severe weakness that is resulting in incoordination and very limited use of his RUE. Pt has been adapting with his LUE.  Discussed POC to focus on hand function and strength.  Pt did work  on bathing and dressing from wc. See ADL documentation above. He is not safe to stand with RW  for self care tasks, so used stedy for more support. Pt able to pull up in stedy but unable to hold legs is static stand for longer than a few seconds.   Pt with many questions about future equipment and expressed that he is "anxious" about his recovery process.  Encouraged pt to focus on one day at a time.   Meal tray arrived, set up meal and cut sandwich into quarters.  Placed foam handle on spoon for dessert. He has to use his nondominant hand to eat.  Suggested a phone holder and stylus as pt is having difficulty managing his phone. Pt resting in wc with belt alarm on and all needs met.   Discharge Criteria: Patient will be discharged from OT if patient refuses treatment 3 consecutive times without medical reason, if treatment goals not met, if there is a change in medical status, if patient makes no progress towards goals or if patient is discharged from hospital.  The above assessment, treatment plan, treatment alternatives and goals were discussed and mutually agreed upon: by patient  Sargeant 08/02/2021, 1:16 PM

## 2021-08-02 NOTE — Progress Notes (Signed)
PROGRESS NOTE   Subjective/Complaints:  Pt reports nerve pain a little to somewhat better- less stinging with gabapentin last night.   Had multiple Bms last night- got cleaned out.   Still has very decreased sensation and hard to tell where limbs are in space.    ROS:  Pt denies SOB, abd pain, CP, N/V/C/D, and vision changes   Objective:   No results found. Recent Labs    08/02/21 0550  WBC 7.2  HGB 12.8*  HCT 38.1*  PLT 190   Recent Labs    08/02/21 0550  NA 135  K 4.3  CL 97*  CO2 26  GLUCOSE 100*  BUN 34*  CREATININE 1.20  CALCIUM 9.2    Intake/Output Summary (Last 24 hours) at 08/02/2021 1001 Last data filed at 08/02/2021 0729 Gross per 24 hour  Intake 240 ml  Output 1900 ml  Net -1660 ml        Physical Exam: Vital Signs Blood pressure (!) 154/76, pulse 74, temperature 98.7 F (37.1 C), resp. rate 14, height 5\' 11"  (1.803 m), weight 100.7 kg, SpO2 97 %.    General: awake, alert, appropriate, sitting up in bed; brighter; NAD HENT: conjugate gaze; oropharynx moist CV: regular rate; no JVD Pulmonary: CTA B/L; no W/R/R- good air movement GI: soft, NT, ND, (+)BS- normoactive BS Psychiatric: appropriate; brighter affect Neurological: Ox3 Genitourinary:    Comments: Foley in place- amber urine Musculoskeletal:     Comments: RUE- biceps 4/5, Triceps 4/5, WE 4-/5, grip 3/5, FA 2/5 LUE 4+/5 in same muscles RLE- HF 3-/5, KE/DF and PF 4/5 LLE- 4+/5 in same muscles   Skin:    General: Skin is warm and dry.     Comments: Surgical site clean and dry.  Neurological:     Comments: Patient is alert.  Speech is dysphonic but intelligible.  No acute distress and follows commands.  Oriented to person place and time.   Decreased to light touch in pinprick from C4 to S5- checked all levels/dermatomes and worse in L1-S1 on L   Assessment/Plan: 1. Functional deficits which require 3+ hours per day of  interdisciplinary therapy in a comprehensive inpatient rehab setting. Physiatrist is providing close team supervision and 24 hour management of active medical problems listed below. Physiatrist and rehab team continue to assess barriers to discharge/monitor patient progress toward functional and medical goals  Care Tool:  Bathing              Bathing assist       Upper Body Dressing/Undressing Upper body dressing   What is the patient wearing?: Hospital gown only    Upper body assist Assist Level: Minimal Assistance - Patient > 75%    Lower Body Dressing/Undressing Lower body dressing            Lower body assist       Toileting Toileting    Toileting assist Assist for toileting: Maximal Assistance - Patient 25 - 49%     Transfers Chair/bed transfer  Transfers assist           Locomotion Ambulation   Ambulation assist  Walk 10 feet activity   Assist           Walk 50 feet activity   Assist           Walk 150 feet activity   Assist           Walk 10 feet on uneven surface  activity   Assist           Wheelchair     Assist               Wheelchair 50 feet with 2 turns activity    Assist            Wheelchair 150 feet activity     Assist          Blood pressure (!) 154/76, pulse 74, temperature 98.7 F (37.1 C), resp. rate 14, height 5\' 11"  (1.803 m), weight 100.7 kg, SpO2 97 %.  Medical Problem List and Plan: 1.  Cervical myelopathy secondary to epidural hematoma status post epidural hematoma 07/22/2021 after ACDF 07/19/2021.  Cervical collar as directed             -patient may  shower if dressing covered             -ELOS/Goals: 10-12 days min A to supervision  -first day of evaluations- con't PT and OT- has a lot of sensory issues as well.   2.  Antithrombotics: -DVT/anticoagulation:  Mechanical: Sequential compression devices, below knee Bilateral lower extremities.   Check vascular study             -antiplatelet therapy: N/A 3. Pain Management: Robaxin/oxycodone as needed -will add Gabapentin 300 mg BID for now.   10/3- stinging/burning pain better- will maintain dose for now.  4. Mood: Celexa 20 mg daily             -antipsychotic agents: N/A 5. Neuropsych: This patient is capable of making decisions on his own behalf. 6. Skin/Wound Care: Routine skin checks 7. Fluids/Electrolytes/Nutrition: Routine in and outs with follow-up chemistries 8.  Hypertension.  Lopressor 50 mg twice daily, lisinopril 10 mg daily, Demadex 20 mg twice daily.  Monitor with increased mobility 9.  BPH with urinary retention,likely neurogenic bladder.  Flomax 0.8 mg daily.  Keep foley for 1-2 days, then try to remove and check PVRs.  10.  Hyperlipidemia.  Crestor 11.  Hypothyroidism.  Synthroid 12.  CAD with CABG 2019.  Aspirin currently on hold 13.  Constipation/neurogenic bowel. Will change to Senokot and give Sorbitol 60cc x1 to get him to go.  14.  GERD.  Protonix  15. Azotemia  10/3- Cr 1.20 which is baseline/slightly better; but BUN p to 34- up from 20s- will push fluids and recheck Thursday.      LOS: 1 days A FACE TO FACE EVALUATION WAS PERFORMED  [name] 08/02/2021, 10:01 AM

## 2021-08-02 NOTE — Progress Notes (Signed)
[facility] Individual Statement of Services  Patient Name:  [name]  Date:  08/02/2021  Welcome to the [facility].  Our goal is to provide you with an individualized program based on your diagnosis and situation, designed to meet your specific needs.  With this comprehensive rehabilitation program, you will be expected to participate in at least 3 hours of rehabilitation therapies Monday-Friday, with modified therapy programming on the weekends.  Your rehabilitation program will include the following services:  Physical Therapy (PT), Occupational Therapy (OT), 24 hour per day rehabilitation nursing, Care Coordinator, Rehabilitation Medicine, Nutrition Services, and Pharmacy Services  Weekly team conferences will be held on Tuesday to discuss your progress.  Your Inpatient Rehabilitation Care Coordinator will talk with you frequently to get your input and to update you on team discussions.  Team conferences with you and your family in attendance may also be held.  Expected length of stay: 21 days  Overall anticipated outcome: supervision-CGA level  Depending on your progress and recovery, your program may change. Your Inpatient Rehabilitation Care Coordinator will coordinate services and will keep you informed of any changes. Your Inpatient Rehabilitation Care Coordinator's name and contact numbers are listed  below.  The following services may also be recommended but are not provided by the [facility] will be made to provide these services after discharge if needed.  Arrangements include referral to agencies that provide these services.  Your insurance has been verified to be:  [employer]-Medicare Your primary doctor is:  Pharmacist, community  Pertinent information will be shared with your doctor and your insurance  company.  Inpatient Rehabilitation Care Coordinator:  Ovidio Kin, [place] or Emilia Beck  Information discussed with and copy given to patient by: Elease Hashimoto, 08/02/2021, 9:46 AM

## 2021-08-02 NOTE — Progress Notes (Signed)
Physical Therapy Session Note  Patient Details  Name: [name] MRN: [id number] Date of Birth: [birth year]  Today's Date: 08/02/2021 PT Individual Time: 8366-2947 PT Individual Time Calculation (min): 58 min   Short Term Goals: Week 1:  PT Short Term Goal 1 (Week 1): Pt will perform bed mobility consistently with minA. PT Short Term Goal 2 (Week 1): Pt wil perform sit to stand transfer consistently with minA. PT Short Term Goal 3 (Week 1): Pt will performs bed to chair transfer consistently with minA. PT Short Term Goal 4 (Week 1): Pt will ambulate x25' with modA +1 and LRAD.  Skilled Therapeutic Interventions/Progress Updates:  Pt received seated in WC, denied pain and was agreeable to PT. Pt attempted to self-propel in WC, unable to grasp rim w/RUE or motor plan movement. Pt transported to main gym w/total A for time management. Lateral scoot transfer from [facility] to mat on R side w/min A. Attempted sit <>stand from EOM to RW w/max A for trunk support, blockage of B knees, and correction of L lateral lean. Lateral scoot transfer to WC on L w/min A.   NMR for R side strengthening and midline orientation  -in // bars, sit <>stand x4 w/60s hold in front of mirror for visual biofeedback on midline orientation. Mod A for steadying and blockage of B knees, and multimodal cues for R weight shift. Pt required lengthy seated breaks between sets.   Pt transported back to room w/total A. Lateral scoot transfer from [facility] to bed on R w/min A and sit <> supine w/CGA. Pt was left supine in bed w/all needs in reach.     Therapy Documentation Precautions:  Precautions Precautions: Fall, Cervical Required Braces or Orthoses: Cervical Brace Cervical Brace: Soft collar Restrictions Weight Bearing Restrictions: No  Therapy/Group: Individual Therapy Cruzita Lederer Brad Lieurance, PT, DPT  08/02/2021, 12:39 PM

## 2021-08-02 NOTE — Progress Notes (Addendum)
Inpatient Rehabilitation Medication Review by a Pharmacist  A complete drug regimen review was completed for this patient to identify any potential clinically significant medication issues.  High Risk Drug Classes Is patient taking? Indication by Medication  Antipsychotic Yes Citalopram for mood disorder  Anticoagulant No   Antibiotic No   Opioid Yes Oxycodone PRN for pain  Antiplatelet No   Hypoglycemics/insulin No   Vasoactive Medication Yes Lisinopril, metoprolol tartrate for hypertension, rosuvastatin for hyperlipidemia, torsemide/potassium  Chemotherapy No   Other Yes Gabapentin for neuropathic pain, methocarbamol PRN for muscle spasms, acetaminophen PRN for pain, levothyroxine for hypothyroidism, multivitamin, pantoprazole for GERD, Flomax for urinary retention, senokot, Maalox PRN, bisacodyl PRN for constipation, albuterol PRN for wheezing/SOB     Type of Medication Issue Identified Description of Issue Recommendation(s)  Drug Interaction(s) (clinically significant)     Duplicate Therapy     Allergy     No Medication Administration End Date     Incorrect Dose     Additional Drug Therapy Needed     Significant med changes from prior encounter (inform family/care partners about these prior to discharge).    Other  Aspirin PTA for CAD with hx CABG (2019) on hold s/p epidural hematoma F/u MD plan for antiplatelet resumption vs. continue to hold as appropriate    Clinically significant medication issues were identified that warrant physician communication and completion of prescribed/recommended actions by midnight of the next day:  No  Name of provider notified for urgent issues identified: n/a  Provider Method of Notification: n/a    Pharmacist comments:   Time spent performing this drug regimen review (minutes):  [place], PharmD, BCPS Please check AMION for all [place] contact numbers Clinical Pharmacist 08/02/2021 10:10 AM

## 2021-08-03 ENCOUNTER — Encounter (HOSPITAL_COMMUNITY): Payer: Self-pay | Admitting: Physical Medicine and Rehabilitation

## 2021-08-03 DIAGNOSIS — G8250 Quadriplegia, unspecified: Secondary | ICD-10-CM | POA: Diagnosis not present

## 2021-08-03 MED ORDER — GABAPENTIN 300 MG PO CAPS
300.0000 mg | ORAL_CAPSULE | Freq: Three times a day (TID) | ORAL | Status: DC
  Administered 2021-08-03 – 2021-08-18 (×45): 300 mg via ORAL
  Filled 2021-08-03 (×45): qty 1

## 2021-08-03 NOTE — Progress Notes (Signed)
Occupational Therapy Session Note  Patient Details  Name: [name] MRN: [id number] Date of Birth: 1951/08/25  Today's Date: 08/03/2021 OT Individual Time: 0930-1030 OT Individual Time Calculation (min): 60 min    Short Term Goals: Week 1:  OT Short Term Goal 1 (Week 1): Pt will be able to sit to stand to RW with mod A of 1 and hold stand for 30 sec in prep for LB self care. OT Short Term Goal 2 (Week 1): Pt will demonstrate improved UE strength to don tshirt with min A OT Short Term Goal 3 (Week 1): Pt will be able to don shorts over feet with mod A. OT Short Term Goal 4 (Week 1): Pt will be able to use R hand to wash L arm with min A.  Skilled Therapeutic Interventions/Progress Updates:    Pt received in wc ready for therapy. Pt taken to gym to focus on UE strength.   Pt adjusted in wc with pillow folded behind back for more upright posture.  He worked on Patent examiner and ext with table top slides, arm circles.  He is having pain in upper neck so movements need to be modified and performed slowly and carefully. Spent considerable time focusing on R forearm and wrist strength with dowel exercises, scooping motions to simulate eating and picking up foam blocks.  He does have improved motion of RUE today and can now manipulate his remote control.  LUE strength has improved a great deal and is now 4/5 strength.   Provided pt with foam block for grasp strength for R hand.   Practices self propelling wc using upright posture and small hand/sh movements to not aggravate his neck pain. Pt stated his neck has hurt from trying to move his chair.    Pt returned to room with all needs met and belt alarm on.    Therapy Documentation Precautions:  Precautions Precautions: Fall, Cervical Required Braces or Orthoses: Cervical Brace Cervical Brace: Soft collar Restrictions Weight Bearing Restrictions: No Other Position/Activity Restrictions: per MD orders, collar can be removed for shower,  in bed and getting in the bathroom  Vital Signs: Therapy Vitals Temp: 98.4 F (36.9 C) Pulse Rate: 78 Resp: 18 BP: 124/78 Patient Position (if appropriate): Lying Oxygen Therapy SpO2: 98 % O2 Device: Room Air Pain: Pain Assessment Pain Scale: 0-10 Pain Score: 7  Pain Type: Acute pain Pain Location: Neck Pain Descriptors / Indicators: Aching;Tightness Pain Onset: On-going Pain Intervention(s): Medication (See eMAR) ADL: ADL Eating: Minimal assistance Grooming: Moderate assistance Upper Body Bathing: Moderate assistance Where Assessed-Upper Body Bathing: Wheelchair Lower Body Bathing: Moderate assistance Where Assessed-Lower Body Bathing: Wheelchair Upper Body Dressing: Maximal assistance Where Assessed-Upper Body Dressing: Wheelchair Lower Body Dressing: Dependent Where Assessed-Lower Body Dressing: Wheelchair Toileting: Dependent Where Assessed-Toileting: Bedside Commode Toilet Transfer: Maximal assistance Toilet Transfer Method: Other (comment) (STEDY)   Therapy/Group: Individual Therapy  [place] 08/03/2021, 8:46 AM

## 2021-08-03 NOTE — Patient Care Conference (Signed)
Inpatient RehabilitationTeam Conference and Plan of Care Update Date: 08/03/2021   Time: 12:01 PM    Patient Name: [name]      Medical Record Number: [id number]  Date of Birth: 02-27-1951 Sex: Male         Room/Bed: 4M04C/4M04C-01 Payor Info: Payor: [employer] MEDICARE / Plan: [facility] MEDICARE / Product Type: *No Product type* /    Admit Date/Time:  08/01/2021  3:32 PM  Primary Diagnosis:  Acute incomplete quadriplegia [facility])  Hospital Problems: Principal Problem:   Acute incomplete quadriplegia ([place]) Active Problems:   Epidural hematoma    Expected Discharge Date: Expected Discharge Date: 08/24/21  Team Members Present: Physician leading conference: Dr. Courtney Heys Social Worker Present: Ovidio Kin, LCSW Nurse Present: Dorien Chihuahua, RN PT Present: Francena Hanly, PT OT Present: Meriel Pica, OT PPS Coordinator present : Gunnar Fusi, SLP     Current Status/Progress Goal Weekly Team Focus  Bowel/Bladder   Foley in place. LBM 10/3. Incontinent of bowel post laxatives. Patient does report some numbness in that area  Be continent B/B  work on discontinuing foley and starting voiding trials. offer bowel meds as needed. continue to monitor with effects of narcotics and laxatives   Swallow/Nutrition/ Hydration             ADL's   max A overall, total with toileting  Min A  UE strengthening, ADL training, pt educ   Mobility   Mod-max A for sit <>stand, min A lateral scoot transfers, CGA-min A bed mobility, mod A WC  CGA  Transfers, R NMR, gait training   Communication             Safety/Cognition/ Behavioral Observations            Pain   5-8 out of 10 Patient taking 2 percocet PRN q4h. Consistent during the day. neck pain and neuropathy  3 out of 10  assess and treat as needed. Medicate prior to morning therapy   Skin   Anterior neck incision dermabond, pink, OTA  no signs of infection. No skin breakdown  assess skin q shift and PRN     Discharge  Planning:  Home with wife who is able to provide assistance-pt hopeful to get more independent then team's goals set for   Team Discussion: Patient admitted post epidural hematoma. BP stable but BUN elevated; encourage fluids and DC IVF. Constipation addressed however patient cannot feel stool in the rectum. Bowel program initiated. Foley at present through Friday, then toileting protocol. (Hx of SP catheter after back surgery in the past); patient is hesitant to have catheter removed until he is more mobile.  Patient on target to meet rehab goals: yes, currently max - total assist for self care due to severe weakness and poor left upper extremity coordination but right side movement has improved.  Poor proprioception and decreased sensation also limits progress. Requires max assist to stand even when using adaptive equipment. And using verbal or tactile cues.  "Flings" himself around with poor control of lower extremities. Goals for discharge set for min assist.  *See Care Plan and progress notes for long and short-term goals.   Revisions to Treatment Plan:    Teaching Needs: Transfers, toileting, medications, safety, bowel and bladder management, etc  Current Barriers to Discharge: Decreased caregiver support, Home enviroment access/layout, and Neurogenic bowel and bladder  Possible Resolutions to Barriers: Family education with wife [place] follow up services     Medical Summary Current Status: Urinary retention-  has foley- cleaned out 10/2; hx of SPC catheter for retention; foley til Friday;  Barriers to Discharge: Decreased family/caregiver support;Home enviroment access/layout;Neurogenic Bowel & Bladder;Medical stability;Weight;Wound care;Weight bearing restrictions;Incontinence   Possible Resolutions to [employer] Focus: focus- needs bowel program - cannot feel needs to go; in 1 day, strength is getting better; but still max A; cervical collar; poor sensation; poor motor control; no  spatial awareness; -will try to see if can move to SCI team;  d/c- 10/25   Continued Need for Acute Rehabilitation Level of Care: The patient requires daily medical management by a physician with specialized training in physical medicine and rehabilitation for the following reasons: Direction of a multidisciplinary physical rehabilitation program to maximize functional independence : Yes Medical management of patient stability for increased activity during participation in an intensive rehabilitation regime.: Yes Analysis of laboratory values and/or radiology reports with any subsequent need for medication adjustment and/or medical intervention. : Yes   I attest that I was present, lead the team conference, and concur with the assessment and plan of the team.   Dorien Chihuahua B 08/03/2021, 3:29 PM

## 2021-08-03 NOTE — Progress Notes (Signed)
Patient ID: [name], male   DOB: Aug 26, 1951, 70 y.o.   MRN: [id number]  Met with pt and daughter who was present in his room to update regarding team conference goals of min-CGA and target discharge 10/25. Daughter concerned Mom may not be able to provide this level and she may need to be the one. Discussed will have both come in for education prior to discharge home. Pt has had a suprapubic cath after one of his back surgeries. Will work together on pt's care needs and have wife and daughter come in for education. Daughter is a Curator and has cared for others. Continue to work on discharge needs.

## 2021-08-03 NOTE — Progress Notes (Signed)
PROGRESS NOTE   Subjective/Complaints:  Pt reports ate breakfast with L hand.  Can now turn over in bed better/on own.   Hx of SPC catheter after a previous back surgery with urinary retention- asking if can keep Foley til later in week- since R hand not working and concerned about using urinal 1 handed.  Will allow til Friday but then will remove.   Wants to increase gabapentin- and try it.   ROS:  Pt denies SOB, abd pain, CP, N/V/C/D, and vision changes   Objective:   VAS [place] LOWER EXTREMITY VENOUS (DVT)  Result Date: 08/02/2021  Lower Venous DVT Study Patient Name:  [name]  Date of Exam:   08/02/2021 Medical Rec #: [id number]      Accession #:    [id number] Date of Birth: July 70, 1952      Patient Gender: M Patient Age:   70 years Exam Location:  [facility] Procedure:      VAS [place] LOWER EXTREMITY VENOUS (DVT) Referring Phys: Lauraine Rinne --------------------------------------------------------------------------------  Indications: Swelling.  Risk Factors: Surgery 07-22-2021 Anterior cervical decompression/discectomy revision fusion w/ evacuation of epidural hematoma. Comparison       12-15-2016 Prior left lower extremity venous was negative for Study:           DVT. Performing Technologist: Darlin Coco RDMS, RVT  Examination Guidelines: A complete evaluation includes B-mode imaging, spectral Doppler, color Doppler, and power Doppler as needed of all accessible portions of each vessel. Bilateral testing is considered an integral part of a complete examination. Limited examinations for reoccurring indications may be performed as noted. The reflux portion of the exam is performed with the patient in reverse Trendelenburg.  +---------+---------------+---------+-----------+----------+--------------+ RIGHT    CompressibilityPhasicitySpontaneityPropertiesThrombus Aging  +---------+---------------+---------+-----------+----------+--------------+ CFV      Full           Yes      Yes                                 +---------+---------------+---------+-----------+----------+--------------+ SFJ      Full                                                        +---------+---------------+---------+-----------+----------+--------------+ FV Prox  Full                                                        +---------+---------------+---------+-----------+----------+--------------+ FV Mid   Full                                                        +---------+---------------+---------+-----------+----------+--------------+ FV DistalFull                                                        +---------+---------------+---------+-----------+----------+--------------+  PFV      Full                                                        +---------+---------------+---------+-----------+----------+--------------+ POP      Full           Yes      Yes                                 +---------+---------------+---------+-----------+----------+--------------+ PTV      Full                                                        +---------+---------------+---------+-----------+----------+--------------+ PERO     Full                                                        +---------+---------------+---------+-----------+----------+--------------+   +---------+---------------+---------+-----------+----------+--------------+ LEFT     CompressibilityPhasicitySpontaneityPropertiesThrombus Aging +---------+---------------+---------+-----------+----------+--------------+ CFV      Full           Yes      Yes                                 +---------+---------------+---------+-----------+----------+--------------+ SFJ      Full                                                         +---------+---------------+---------+-----------+----------+--------------+ FV Prox  Full                                                        +---------+---------------+---------+-----------+----------+--------------+ FV Mid   Full                                                        +---------+---------------+---------+-----------+----------+--------------+ FV DistalFull                                                        +---------+---------------+---------+-----------+----------+--------------+ PFV      Full                                                        +---------+---------------+---------+-----------+----------+--------------+  POP      Full           Yes      Yes                                 +---------+---------------+---------+-----------+----------+--------------+ PTV      Full                                                        +---------+---------------+---------+-----------+----------+--------------+ PERO     Full                                                        +---------+---------------+---------+-----------+----------+--------------+     Summary: RIGHT: - There is no evidence of deep vein thrombosis in the lower extremity.  - No cystic structure found in the popliteal fossa.  LEFT: - There is no evidence of deep vein thrombosis in the lower extremity.  - No cystic structure found in the popliteal fossa.  *See table(s) above for measurements and observations. Electronically signed by Servando Snare MD on 08/02/2021 at 4:54:37 PM.    Final    Recent Labs    08/02/21 0550  WBC 7.2  HGB 12.8*  HCT 38.1*  PLT 190   Recent Labs    08/02/21 0550  NA 135  K 4.3  CL 97*  CO2 26  GLUCOSE 100*  BUN 34*  CREATININE 1.20  CALCIUM 9.2    Intake/Output Summary (Last 24 hours) at 08/03/2021 0834 Last data filed at 08/03/2021 0714 Gross per 24 hour  Intake 598 ml  Output 1250 ml  Net -652 ml        Physical  Exam: Vital Signs Blood pressure 122/80, pulse 77, temperature 98.4 F (36.9 C), resp. rate 18, height 5\' 11"  (1.803 m), weight 100.7 kg, SpO2 98 %.     General: awake, alert, appropriate, sitting up in bed watching TV; NAD HENT: conjugate gaze; oropharynx moist CV: regular rate; no JVD Pulmonary: CTA B/L; no W/R/R- good air movement GI: soft, NT, ND, (+)BS Psychiatric: appropriate Neurological: alert Genitourinary:    Comments: Foley in place- amber urine- no change Musculoskeletal:     Comments: RUE- biceps 4/5, Triceps 4/5, WE 4-/5, grip 3/5, FA 2/5 LUE 4+/5 in same muscles RLE- HF 3-/5, KE/DF and PF 4/5 LLE- 4+/5 in same muscles   Skin:    General: Skin is warm and dry.     Comments: Surgical site clean and dry.  Neurological:     Comments: Patient is alert.  Speech is dysphonic but intelligible.  No acute distress and follows commands.  Oriented to person place and time.   Decreased to light touch in pinprick from C4 to S5- checked all levels/dermatomes and worse in L1-S1 on L   Assessment/Plan: 1. Functional deficits which require 3+ hours per day of interdisciplinary therapy in a comprehensive inpatient rehab setting. Physiatrist is providing close team supervision and 24 hour management of active medical problems listed below. Physiatrist and rehab team continue to assess barriers to discharge/monitor patient progress toward functional and medical goals  Care  Tool:  Bathing    Body parts bathed by patient: Chest, Abdomen, Right arm, Right upper leg, Left upper leg, Face   Body parts bathed by helper: Left arm, Front perineal area, Buttocks, Right lower leg, Left lower leg     Bathing assist Assist Level: Moderate Assistance - Patient 50 - 74%     Upper Body Dressing/Undressing Upper body dressing   What is the patient wearing?: Pull over shirt    Upper body assist Assist Level: Maximal Assistance - Patient 25 - 49%    Lower Body Dressing/Undressing Lower  body dressing      What is the patient wearing?: Incontinence brief, Pants     Lower body assist Assist for lower body dressing: Total Assistance - Patient < 25%     Toileting Toileting    Toileting assist Assist for toileting: Dependent - Patient 0%     Transfers Chair/bed transfer  Transfers assist     Chair/bed transfer assist level: Minimal Assistance - Patient > 75%     Locomotion Ambulation   Ambulation assist      Assist level: 2 helpers Assistive device: Parallel bars Max distance: 5'   Walk 10 feet activity   Assist  Walk 10 feet activity did not occur: Safety/medical concerns        Walk 50 feet activity   Assist Walk 50 feet with 2 turns activity did not occur: Safety/medical concerns         Walk 150 feet activity   Assist Walk 150 feet activity did not occur: Safety/medical concerns         Walk 10 feet on uneven surface  activity   Assist Walk 10 feet on uneven surfaces activity did not occur: Safety/medical concerns         Wheelchair     Assist Is the patient using a wheelchair?: Yes Type of Wheelchair: Manual    Wheelchair assist level: Supervision/Verbal cueing Max wheelchair distance: 20'    Wheelchair 50 feet with 2 turns activity    Assist        Assist Level: Minimal Assistance - Patient > 75%   Wheelchair 150 feet activity     Assist      Assist Level: Moderate Assistance - Patient 50 - 74%   Blood pressure 122/80, pulse 77, temperature 98.4 F (36.9 C), resp. rate 18, height 5\' 11"  (1.803 m), weight 100.7 kg, SpO2 98 %.  Medical Problem List and Plan: 1.  Cervical myelopathy secondary to epidural hematoma status post epidural hematoma 07/22/2021 after ACDF 07/19/2021.  Cervical collar as directed             -patient may  shower if dressing covered             -ELOS/Goals: 10-12 days min A to supervision  Con't PT and OT/CIR_ team conference today to determine length of  stay.  2.  Antithrombotics: -DVT/anticoagulation:  Mechanical: Sequential compression devices, below knee Bilateral lower extremities.  Check vascular study             -antiplatelet therapy: N/A 3. Pain Management: Robaxin/oxycodone as needed -will add Gabapentin 300 mg BID for now.   10/3- stinging/burning pain better- will maintain dose for now.   10/4- will increase Gabapentin to 300 mg TID per pt request 4. Mood: Celexa 20 mg daily             -antipsychotic agents: N/A 5. Neuropsych: This patient is capable of making decisions on his  own behalf. 6. Skin/Wound Care: Routine skin checks 7. Fluids/Electrolytes/Nutrition: Routine in and outs with follow-up chemistries 8.  Hypertension.  Lopressor 50 mg twice daily, lisinopril 10 mg daily, Demadex 20 mg twice daily.  Monitor with increased mobility 9.  BPH with urinary retention,likely neurogenic bladder.  Flomax 0.8 mg daily.  Keep foley for 1-2 days, then try to remove and check PVRs.   10/4- pt insistent wants to keep foley longer due to 1 handedness with urinal- will d/c Friday-don't feel comfortable keeping longer. On max dose Flomax- hx of SPC.  10.  Hyperlipidemia.  Crestor 11.  Hypothyroidism.  Synthroid 12.  CAD with CABG 2019.  Aspirin currently on hold 13.  Constipation/neurogenic bowel. Will change to Senokot and give Sorbitol 60cc x1 to get him to go.   10/4- LBM yesterday- doing well- con't regimen 14.  GERD.  Protonix  15. Azotemia  10/3- Cr 1.20 which is baseline/slightly better; but BUN p to 34- up from 20s- will push fluids and recheck Thursday.      LOS: 2 days A FACE TO FACE EVALUATION WAS PERFORMED  Deran Barro 08/03/2021, 8:34 AM

## 2021-08-03 NOTE — Progress Notes (Signed)
Physical Therapy Session Note  Patient Details  Name: [name] MRN: [id number] Date of Birth: 06-11-1951  Today's Date: 08/03/2021 PT Individual Time: 6606-3016 PT Individual Time Calculation (min): 61 min   Short Term Goals: Week 1:  PT Short Term Goal 1 (Week 1): Pt will perform bed mobility consistently with minA. PT Short Term Goal 2 (Week 1): Pt wil perform sit to stand transfer consistently with minA. PT Short Term Goal 3 (Week 1): Pt will performs bed to chair transfer consistently with minA. PT Short Term Goal 4 (Week 1): Pt will ambulate x25' with modA +1 and LRAD.  Skilled Therapeutic Interventions/Progress Updates:  Pt received seated in WC, asleep, easy to arouse. Pt reported 7/10 pain radiating from cervical region to R shoulder, was premedicated. Offered gentle mobilization, massage and stretching for pain management. Pt transported to ortho gym w/total A for time management and performed squat pivot to NuStep on R w/min A. Pt performed 8 minutes on NuStep level 3 for dynamic cardiovascular warmup, improved grip strength of R > L, and practice w/UE/LE coordination. Noted pt demonstrated improved motor control of BLEs on bike compared to yesterday and was able to maintain grip on bike w/BUEs. Squat pivot to WC to L w/min A and pt transported to main gym w/total A. Squat pivot to mat on R w/min A and attempted 2 sit <>stands to RW in front of mirror for visual biofeedback on body position and midline orientation. First stand required max A for L trunk lean correction, bilateral knee block, AD management and multimodal cues for glute and quad activation. Second sit <>stand w/2 helpers for safety, pt unable to achieve knee or trunk extension and demonstrated significant anterior lean due to flexed posture. Attempted 2 sit <>stands in Maxi sling w/2 helpers, but due to pt's flexed posture and cervical collar, harness choked pt and unable to use body weight support feature properly. Will  use LiteGait in future sessions. Pt transported back to room w/total A and performed squat pivot to bed on L w/min A. Sit <>supine w/CGA and pt was left supine in bed, daughter present in room, all needs in reach. Pt declined modalities for pain modulation.   Therapy Documentation Precautions:  Precautions Precautions: Fall, Cervical Required Braces or Orthoses: Cervical Brace Cervical Brace: Soft collar Restrictions Weight Bearing Restrictions: No Other Position/Activity Restrictions: per MD orders, collar can be removed for shower, in bed and getting in the bathroom   Therapy/Group: Individual Therapy Cruzita Lederer Lailanie Hasley, PT, DPT  08/03/2021, 7:56 AM

## 2021-08-03 NOTE — Progress Notes (Signed)
Occupational Therapy Session Note  Patient Details  Name: [name] MRN: [id number] Date of Birth: 04/25/1951  Today's Date: 08/03/2021 OT Individual Time: 1403-1500 OT Individual Time Calculation (min): 57 min    Short Term Goals: Week 1:  OT Short Term Goal 1 (Week 1): Pt will be able to sit to stand to RW with mod A of 1 and hold stand for 30 sec in prep for LB self care. OT Short Term Goal 2 (Week 1): Pt will demonstrate improved UE strength to don tshirt with min A OT Short Term Goal 3 (Week 1): Pt will be able to don shorts over feet with mod A. OT Short Term Goal 4 (Week 1): Pt will be able to use R hand to wash L arm with min A.  Skilled Therapeutic Interventions/Progress Updates:  Pt greeted supine in bed agreeable to OT intervention. Session focus on various therapeutic activities focused on sit<>stands, transfers and simulated self feeding tasks with RUE. Pt completed bed mobility with CGA. Pt completed squat pivot transfer from EOB >w/c going towards pts R side with MIN A. Pt transported to gym with total A. Worked on sit<>stands with use of mirror to provided visual feedback as pt presents with impaired proprioception. Pt completed x3 sit<>stands with  Rw with MAX A. Pt with difficulty extending knees and shifting hips anteriorly and elevating trunk. Pt relies heavily on BUEs to maintain upright posture with UEs quickly fatiguing. Remainder of session to focus on AE for self feeding as pt is R handed but presents with impaired [place] in RUE, pt able to use U-cuff to scoop beads with dicem placed underneath plate. Issued pt U-cuff and dicem to practice with during meals. Pt transported back to room with total A where pt completed additional squat pivot transfer from w/c>EOB with MIN A going towards pts R side. Pt left supine in bed with RN present.  Therapy Documentation Precautions:  Precautions Precautions: Fall, Cervical Required Braces or Orthoses: Cervical Brace Cervical  Brace: Soft collar Restrictions Weight Bearing Restrictions: No Other Position/Activity Restrictions: per MD orders, collar can be removed for shower, in bed and getting in the bathroom  Pain: pt reports unrated general pain, offered rest breaks and repositioning as needed during session     Therapy/Group: Individual Therapy  Precious Haws 08/03/2021, 3:48 PM

## 2021-08-03 NOTE — Progress Notes (Signed)
Occupational Therapy Session Note  Patient Details  Name: RUXIN RANSOME MRN: [id number] Date of Birth: Jun 28, 1951  Today's Date: 08/03/2021 OT Individual Time: 0352-4818 OT Individual Time Calculation (min): 21 min    Short Term Goals: Week 1:  OT Short Term Goal 1 (Week 1): Pt will be able to sit to stand to RW with mod A of 1 and hold stand for 30 sec in prep for LB self care. OT Short Term Goal 2 (Week 1): Pt will demonstrate improved UE strength to don tshirt with min A OT Short Term Goal 3 (Week 1): Pt will be able to don shorts over feet with mod A. OT Short Term Goal 4 (Week 1): Pt will be able to use R hand to wash L arm with min A.  Skilled Therapeutic Interventions/Progress Updates:  Patient met lying supine in bed in agreement with OT treatment session. Mild pain reported at rest and with activity. RN present for med administration during beginning of session. Supine to EOB with heavy use of bed rail and good carryover of log rolling technique. Squat-pivot to wc on L with Min A. Patient able to self-propel wc ~10-59f x2 trials with Total A for wc transport the rest of the way to ortho gym for time management. Focus of session on RUE NMR with BUE arm ergometer. Patient able to maintain position of RUE for ~1 min at a time before losing grip. Education on use of RUE as much as possible throughout the day to promote NMR. Patient expressed verbal understanding. Session concluded with patient seated in wc with call bell within reach, belt alarm activated and all needs met.   Therapy Documentation Precautions:  Precautions Precautions: Fall, Cervical Required Braces or Orthoses: Cervical Brace Cervical Brace: Soft collar Restrictions Weight Bearing Restrictions: No Other Position/Activity Restrictions: per MD orders, collar can be removed for shower, in bed and getting in the bathroom General:    Therapy/Group: Individual Therapy  Darleene Cumpian R [place]Davis 08/03/2021, 6:55 AM

## 2021-08-04 ENCOUNTER — Other Ambulatory Visit: Payer: Self-pay

## 2021-08-04 DIAGNOSIS — G8250 Quadriplegia, unspecified: Secondary | ICD-10-CM | POA: Diagnosis not present

## 2021-08-04 MED ORDER — CHLORHEXIDINE GLUCONATE CLOTH 2 % EX PADS
6.0000 | MEDICATED_PAD | Freq: Every day | CUTANEOUS | Status: DC
  Administered 2021-08-04 – 2021-08-05 (×2): 6 via TOPICAL

## 2021-08-04 MED ORDER — BISACODYL 10 MG RE SUPP
10.0000 mg | Freq: Every day | RECTAL | Status: DC
  Administered 2021-08-06 – 2021-08-23 (×17): 10 mg via RECTAL
  Filled 2021-08-04 (×20): qty 1

## 2021-08-04 NOTE — Progress Notes (Signed)
Occupational Therapy Session Note  Patient Details  Name: [name] MRN: [id number] Date of Birth: Oct 19, 1951  Today's Date: 08/04/2021 OT Individual Time: 1115-1200 OT Individual Time Calculation (min): 45 min    Short Term Goals: Week 1:  OT Short Term Goal 1 (Week 1): Pt will be able to sit to stand to RW with mod A of 1 and hold stand for 30 sec in prep for LB self care. OT Short Term Goal 2 (Week 1): Pt will demonstrate improved UE strength to don tshirt with min A OT Short Term Goal 3 (Week 1): Pt will be able to don shorts over feet with mod A. OT Short Term Goal 4 (Week 1): Pt will be able to use R hand to wash L arm with min A.  Skilled Therapeutic Interventions/Progress Updates:    Pt resting in bed upon arrival. OT intervention with focus on RUE function and improved control. Pt with full AROM at wrist, elbow, and hand. Pt with significant ataxia and decreased control. Per MD request, e-stim initiated with no significant improvement in function. Myofacial release to Rt upper trap for pain mgmt and improved function. Pt remained in bed with all needs within reach and bed alarm activated.   Therapy Documentation Precautions:  Precautions Precautions: Fall, Cervical Required Braces or Orthoses: Cervical Brace Cervical Brace: Soft collar Restrictions Weight Bearing Restrictions: No Other Position/Activity Restrictions: per MD orders, collar can be removed for shower, in bed and getting in the bathroom   Pain: Pt c/o Rt upper trap discomfort when palpated; myofascial release with relief noted  Therapy/Group: Individual Therapy  [name] 08/04/2021, 12:11 PM

## 2021-08-04 NOTE — Progress Notes (Signed)
Physical Therapy Session Note  Patient Details  Name: [name] MRN: [id number] Date of Birth: [birth year]  Today's Date: 08/04/2021 PT Individual Time: 1305-1405 PT Individual Time Calculation (min): 60 min   Short Term Goals: Week 1:  PT Short Term Goal 1 (Week 1): Pt will perform bed mobility consistently with minA. PT Short Term Goal 2 (Week 1): Pt wil perform sit to stand transfer consistently with minA. PT Short Term Goal 3 (Week 1): Pt will performs bed to chair transfer consistently with minA. PT Short Term Goal 4 (Week 1): Pt will ambulate x25' with modA +1 and LRAD.  Skilled Therapeutic Interventions/Progress Updates: Pt presented in bed agreeable to therapy. Pt states some pain in bilateral shoulders/neck but recently premedcated, pt requesting ms relaxer at end of session therefore notifying T[place], LPN. Pt performed supine to sit at EOB with CGA and use of bed features. Pt with no LOB while sitting unsupported at EOB. Pt then performed squat pivot transfer to R with CGA and fair clearance of buttocks from bed. Pt transported to rehab gym and performed stand pivot transfer to high/low mat. Pt participated in Sit to stand from elevated mat with [name] walker x 4 with CGA, pt noted to use BUE to brace against walker to facilitate stand. While in standing pt was able to perform standing march with PTA placing 3lb weights on BLE for increased feedback due to ataxia. During seated rest breaks pt performed toe taps to target (orange dot) to fatigue. On last standing bout pt participated in static balance reaching and stacking/placing cups on tray table. Pt required mod multimodal cues for anterior translation of hips and attempting to achieve erect posture. Pt was able to perform with both L and RUE. Once task completed performed squat pivot transfer to L to w/c with CGA. Pt attempted to propel ~12f for forced use of BUE with pt able to successfully grip R wheel rim ~75% of time. Pt transported  back to room and requested to remain in w/c as had been in bed most of morning. Pt left in w/c with belt alarm on, call bell within reach and needs met.      Therapy Documentation Precautions:  Precautions Precautions: Fall, Cervical Required Braces or Orthoses: Cervical Brace Cervical Brace: Soft collar Restrictions Weight Bearing Restrictions: No Other Position/Activity Restrictions: per MD orders, collar can be removed for shower, in bed and getting in the bathroom General:   Vital Signs: Therapy Vitals Temp: 98.3 F (36.8 C) Pulse Rate: 76 Resp: 18 BP: (!) 107/93 Patient Position (if appropriate): Sitting Oxygen Therapy SpO2: 96 % O2 Device: Room Air Pain: Pain Assessment Pain Scale: 0-10 Pain Score: 7  Pain Type: Acute pain Pain Location: Neck Pain Orientation: Posterior;Medial Pain Descriptors / Indicators: Aching Pain Frequency: Intermittent Pain Onset: Gradual Patients Stated Pain Goal: 4 Pain Intervention(s): Medication (See eMAR) Multiple Pain Sites: No Mobility:   Locomotion :    Trunk/Postural Assessment :    Balance:   Exercises:   Other Treatments:      Therapy/Group: Individual Therapy  Jakhari Space 08/04/2021, 3:56 PM

## 2021-08-04 NOTE — Progress Notes (Signed)
PROGRESS NOTE   Subjective/Complaints:  Pt reports increase in gabapentin is really helping nerve pain/stinging.  R side is moving better.  Cannot feel bowels- we discussed doing bowel program and what this entails- he's OK with this.  Aslo discussed with pt/OT to see if can try Estim for R side.  No hx of cancer.   ROS:  Pt denies SOB, abd pain, CP, N/V/C/D, and vision changes   Objective:   VAS [place] LOWER EXTREMITY VENOUS (DVT)  Result Date: 08/02/2021  Lower Venous DVT Study Patient Name:  [name]  Date of Exam:   08/02/2021 Medical Rec #: [id number]      Accession #:    [id number] Date of Birth: [birth year]      Patient Gender: M Patient Age:   70 years Exam Location:  [facility] Procedure:      VAS [place] LOWER EXTREMITY VENOUS (DVT) Referring Phys: Lauraine Rinne --------------------------------------------------------------------------------  Indications: Swelling.  Risk Factors: Surgery 07-22-2021 Anterior cervical decompression/discectomy revision fusion w/ evacuation of epidural hematoma. Comparison       12-15-2016 Prior left lower extremity venous was negative for Study:           DVT. Performing Technologist: Darlin Coco RDMS, RVT  Examination Guidelines: A complete evaluation includes B-mode imaging, spectral Doppler, color Doppler, and power Doppler as needed of all accessible portions of each vessel. Bilateral testing is considered an integral part of a complete examination. Limited examinations for reoccurring indications may be performed as noted. The reflux portion of the exam is performed with the patient in reverse Trendelenburg.  +---------+---------------+---------+-----------+----------+--------------+ RIGHT    CompressibilityPhasicitySpontaneityPropertiesThrombus Aging +---------+---------------+---------+-----------+----------+--------------+ CFV      Full           Yes      Yes                                  +---------+---------------+---------+-----------+----------+--------------+ SFJ      Full                                                        +---------+---------------+---------+-----------+----------+--------------+ FV Prox  Full                                                        +---------+---------------+---------+-----------+----------+--------------+ FV Mid   Full                                                        +---------+---------------+---------+-----------+----------+--------------+ FV DistalFull                                                        +---------+---------------+---------+-----------+----------+--------------+  PFV      Full                                                        +---------+---------------+---------+-----------+----------+--------------+ POP      Full           Yes      Yes                                 +---------+---------------+---------+-----------+----------+--------------+ PTV      Full                                                        +---------+---------------+---------+-----------+----------+--------------+ PERO     Full                                                        +---------+---------------+---------+-----------+----------+--------------+   +---------+---------------+---------+-----------+----------+--------------+ LEFT     CompressibilityPhasicitySpontaneityPropertiesThrombus Aging +---------+---------------+---------+-----------+----------+--------------+ CFV      Full           Yes      Yes                                 +---------+---------------+---------+-----------+----------+--------------+ SFJ      Full                                                        +---------+---------------+---------+-----------+----------+--------------+ FV Prox  Full                                                         +---------+---------------+---------+-----------+----------+--------------+ FV Mid   Full                                                        +---------+---------------+---------+-----------+----------+--------------+ FV DistalFull                                                        +---------+---------------+---------+-----------+----------+--------------+ PFV      Full                                                        +---------+---------------+---------+-----------+----------+--------------+  POP      Full           Yes      Yes                                 +---------+---------------+---------+-----------+----------+--------------+ PTV      Full                                                        +---------+---------------+---------+-----------+----------+--------------+ PERO     Full                                                        +---------+---------------+---------+-----------+----------+--------------+     Summary: RIGHT: - There is no evidence of deep vein thrombosis in the lower extremity.  - No cystic structure found in the popliteal fossa.  LEFT: - There is no evidence of deep vein thrombosis in the lower extremity.  - No cystic structure found in the popliteal fossa.  *See table(s) above for measurements and observations. Electronically signed by Servando Snare MD on 08/02/2021 at 4:54:37 PM.    Final    Recent Labs    08/02/21 0550  WBC 7.2  HGB 12.8*  HCT 38.1*  PLT 190   Recent Labs    08/02/21 0550  NA 135  K 4.3  CL 97*  CO2 26  GLUCOSE 100*  BUN 34*  CREATININE 1.20  CALCIUM 9.2    Intake/Output Summary (Last 24 hours) at 08/04/2021 0853 Last data filed at 08/04/2021 1324 Gross per 24 hour  Intake 897 ml  Output 1200 ml  Net -303 ml     Pressure Injury 08/03/21 Buttocks Left;Right Stage 1 -  Intact skin with non-blanchable redness of a localized area usually over a bony prominence. Pink, non blanching (Active)   08/03/21 2100  Location: Buttocks  Location Orientation: Left;Right  Staging: Stage 1 -  Intact skin with non-blanchable redness of a localized area usually over a bony prominence.  Wound Description (Comments): Pink, non blanching  Present on Admission:     Physical Exam: Vital Signs Blood pressure 140/77, pulse 67, temperature 97.9 F (36.6 C), temperature source Oral, resp. rate 16, height 5\' 11"  (1.803 m), weight 100.2 kg, SpO2 99 %.      General: awake, alert, appropriate, sitting up eating breakfast in bed; NAD HENT: conjugate gaze; oropharynx moist CV: regular rate; no JVD Pulmonary: CTA B/L; no W/R/R- good air movement GI: soft, NT, ND, (+)BS- hyperactive Psychiatric: appropriate Neurological: Ox3 Decreased sensation still to S5- so neurogenic bowel Genitourinary:    Comments: Foley in place- amber urine- no change Musculoskeletal:     Comments: RUE- biceps 4/5, Triceps 4/5, WE 4-/5, grip 3/5, FA 2/5 LUE 4+/5 in same muscles RLE- HF 3-/5, KE/DF and PF 4/5 LLE- 4+/5 in same muscles   Skin:    General: Skin is warm and dry.     Comments: Surgical site clean and dry.  Neurological:     Comments: Patient is alert.  Speech is dysphonic but intelligible.  No acute distress and follows commands.  Oriented  to person place and time.   Decreased to light touch in pinprick from C4 to S5- checked all levels/dermatomes and worse in L1-S1 on L   Assessment/Plan: 1. Functional deficits which require 3+ hours per day of interdisciplinary therapy in a comprehensive inpatient rehab setting. Physiatrist is providing close team supervision and 24 hour management of active medical problems listed below. Physiatrist and rehab team continue to assess barriers to discharge/monitor patient progress toward functional and medical goals  Care Tool:  Bathing    Body parts bathed by patient: Chest, Abdomen, Right arm, Right upper leg, Left upper leg, Face   Body parts bathed by helper:  Left arm, Front perineal area, Buttocks, Right lower leg, Left lower leg     Bathing assist Assist Level: Moderate Assistance - Patient 50 - 74%     Upper Body Dressing/Undressing Upper body dressing   What is the patient wearing?: Pull over shirt    Upper body assist Assist Level: Maximal Assistance - Patient 25 - 49%    Lower Body Dressing/Undressing Lower body dressing      What is the patient wearing?: Incontinence brief, Pants     Lower body assist Assist for lower body dressing: Total Assistance - Patient < 25%     Toileting Toileting    Toileting assist Assist for toileting: Dependent - Patient 0%     Transfers Chair/bed transfer  Transfers assist     Chair/bed transfer assist level: Minimal Assistance - Patient > 75%     Locomotion Ambulation   Ambulation assist      Assist level: 2 helpers Assistive device: Parallel bars Max distance: 5'   Walk 10 feet activity   Assist  Walk 10 feet activity did not occur: Safety/medical concerns        Walk 50 feet activity   Assist Walk 50 feet with 2 turns activity did not occur: Safety/medical concerns         Walk 150 feet activity   Assist Walk 150 feet activity did not occur: Safety/medical concerns         Walk 10 feet on uneven surface  activity   Assist Walk 10 feet on uneven surfaces activity did not occur: Safety/medical concerns         Wheelchair     Assist Is the patient using a wheelchair?: Yes Type of Wheelchair: Manual    Wheelchair assist level: Supervision/Verbal cueing Max wheelchair distance: 20'    Wheelchair 50 feet with 2 turns activity    Assist        Assist Level: Minimal Assistance - Patient > 75%   Wheelchair 150 feet activity     Assist      Assist Level: Moderate Assistance - Patient 50 - 74%   Blood pressure 140/77, pulse 67, temperature 97.9 F (36.6 C), temperature source Oral, resp. rate 16, height 5\' 11"  (1.803 m),  weight 100.2 kg, SpO2 99 %.  Medical Problem List and Plan: 1.  Cervical myelopathy secondary to epidural hematoma status post epidural hematoma 07/22/2021 after ACDF 07/19/2021.  Cervical collar as directed             -patient may  shower if dressing covered             -ELOS/Goals: 10-12 days min A to supervision  Con't PT and OT/CIR_ team conference today to determine length of stay.  10/5- switched to SCI service- will try estim for R side- con't PT and OT- CIR- d/c date  08/24/21 2.  Antithrombotics: -DVT/anticoagulation:  Mechanical: Sequential compression devices, below knee Bilateral lower extremities.  Check vascular study  10/5- since had epidural hematoma, don't want to start Lovenox without d/w NSU- Doppler s (-)             -antiplatelet therapy: N/A 3. Pain Management: Robaxin/oxycodone as needed -will add Gabapentin 300 mg BID for now.   10/3- stinging/burning pain better- will maintain dose for now.   10/4- will increase Gabapentin to 300 mg TID per pt request  10/5- increase in gabapentin helping pain substantially- con't regimen 4. Mood: Celexa 20 mg daily             -antipsychotic agents: N/A 5. Neuropsych: This patient is capable of making decisions on his own behalf. 6. Skin/Wound Care: Routine skin checks 7. Fluids/Electrolytes/Nutrition: Routine in and outs with follow-up chemistries 8.  Hypertension.  Lopressor 50 mg twice daily, lisinopril 10 mg daily, Demadex 20 mg twice daily.  Monitor with increased mobility 9.  BPH with urinary retention,likely neurogenic bladder.  Flomax 0.8 mg daily.  Keep foley for 1-2 days, then try to remove and check PVRs.   10/4- pt insistent wants to keep foley longer due to 1 handedness with urinal- will d/c Friday-don't feel comfortable keeping longer. On max dose Flomax- hx of SPC.  10.  Hyperlipidemia.  Crestor 11.  Hypothyroidism.  Synthroid 12.  CAD with CABG 2019.  Aspirin currently on hold 13.  Constipation/neurogenic bowel.  Will change to Senokot and give Sorbitol 60cc x1 to get him to go.   10/4- LBM yesterday- doing well- con't regimen  10/5- will start Bowel program this evening- went over what it entails and how it works and takes 3-6 weeks to get into stable regimen.  14.  GERD.  Protonix  15. Azotemia  10/3- Cr 1.20 which is baseline/slightly better; but BUN p to 34- up from 20s- will push fluids and recheck Thursday.    I spent a total of 38 minutes on total care- >50% on coordination of care- going over with OT/PT about Estim, changing to SCI team and discussing bowel program.    LOS: 3 days A FACE TO FACE EVALUATION WAS PERFORMED  [name] 08/04/2021, 8:53 AM

## 2021-08-04 NOTE — Progress Notes (Signed)
Bowel program started with digital stimulation. Patient presented with nothing in rectal chamber. Suppository introduced at 1815 into rectum. No stool present. Digital stimulation introduced at 1830 and 1845 with no success of bowel movement. Incoming shift will continue with bowel program. Sanda Linger, LPN

## 2021-08-04 NOTE — IPOC Note (Signed)
Overall Plan of Care [facility]) Patient Details Name: [name] MRN: [id number] DOB: [birth year]  Admitting Diagnosis: Acute incomplete quadriplegia [facility])  Hospital Problems: Principal Problem:   Acute incomplete quadriplegia ([place]) Active Problems:   Epidural hematoma     Functional Problem List: Nursing Bladder, Medication Management, Safety, Pain, Endurance, Bowel, Skin Integrity  [name] Balance, Endurance, Motor, Pain, Perception, Safety, Sensory  OT Balance, Endurance, Motor, Pain, Sensory  SLP    TR         Basic ADL's: OT Eating, Grooming, Bathing, Dressing, Toileting     Advanced  ADL's: OT       Transfers: [name] Bed Mobility, Bed to Chair, Car, Manufacturing systems engineer, Metallurgist: [name] Ambulation, Emergency planning/management officer, Stairs     Additional Impairments: OT Fuctional Use of Upper Extremity  SLP        TR      Anticipated Outcomes Item Anticipated Outcome  Self Feeding set up  Swallowing      Basic self-care  min A  Toileting  min A   Bathroom Transfers min A  Bowel/Bladder  manage bowel w mod I and bladder w min assist  Transfers  Supervision  Locomotion  Supervision  Communication     Cognition     Pain  at or below level 4  Safety/Judgment  Maintain safety w cues   Therapy Plan: [name] Intensity: Minimum of 1-2 x/day ,45 to 90 minutes [name] Frequency: 5 out of 7 days [name] Duration Estimated Length of Stay: 3 weeks OT Intensity: Minimum of 1-2 x/day, 45 to 90 minutes OT Frequency: 5 out of 7 days OT Duration/Estimated Length of Stay: 21 days     Due to the current state of emergency, patients may not be receiving their 3-hours of Medicare-mandated therapy.   Team Interventions: Nursing Interventions Bladder Management, Disease Management/Prevention, Medication Management, Discharge Planning, Pain Management, Bowel Management, Patient/Family Education, Skin Care/Wound Management  [name] interventions Ambulation/gait training, Community  reintegration, DME/adaptive equipment instruction, Neuromuscular re-education, Psychosocial support, Stair training, UE/LE Strength taining/ROM, Wheelchair propulsion/positioning, Training and development officer, Discharge planning, Pain management, Functional electrical stimulation, Skin care/wound management, UE/LE Coordination activities, Therapeutic Activities, Cognitive remediation/compensation, Disease management/prevention, Functional mobility training, Patient/family education, Splinting/orthotics, Therapeutic Exercise  OT Interventions Balance/vestibular training, DME/adaptive equipment instruction, Disease mangement/prevention, Discharge planning, Functional mobility training, Neuromuscular re-education, Psychosocial support, Patient/family education, Pain management, Self Care/advanced ADL retraining, UE/LE Strength taining/ROM, Therapeutic Exercise, Therapeutic Activities, UE/LE Coordination activities  SLP Interventions    TR Interventions    SW/CM Interventions Discharge Planning, Psychosocial Support, Patient/Family Education   Barriers to Discharge MD  Medical stability, Home enviroment access/loayout, Incontinence, Neurogenic bowel and bladder, Wound care, Weight bearing restrictions, and Behavior  Nursing Decreased caregiver support, Home environment access/layout, Neurogenic Bowel & Bladder 1 level 3 ste w spouse; daughter to assist  [name]      OT      SLP      SW       Team Discharge Planning: Destination: [name]-Home ,OT- Home , SLP-  Projected Follow-up: [name]-Home health [name], OT-  Home health OT, SLP-  Projected Equipment Needs: [name]-To be determined, OT- To be determined, SLP-  Equipment Details: [name]- , OT-  Patient/family involved in discharge planning: [name]- Patient,  OT-Patient, SLP-   MD ELOS: ~ 3weeks Medical Rehab Prognosis:  Good Assessment: [name] is a 70 yr old male with hx of ACDF and new epidural hematoma with resulting incomplete quadriplegia and neurogenic bowel and bladder-  urinary  retention and bowel incontinence- Also post op pain and nerve pain- Goals- min A    See Team Conference Notes for weekly updates to the plan of care

## 2021-08-04 NOTE — Progress Notes (Signed)
Occupational Therapy Session Note  Patient Details  Name: [name] MRN: [id number] Date of Birth: 1951-03-10  Today's Date: 08/04/2021 OT Individual Time: 7654-6503 OT Individual Time Calculation (min): 43 min    Short Term Goals: Week 1:  OT Short Term Goal 1 (Week 1): Pt will be able to sit to stand to RW with mod A of 1 and hold stand for 30 sec in prep for LB self care. OT Short Term Goal 2 (Week 1): Pt will demonstrate improved UE strength to don tshirt with min A OT Short Term Goal 3 (Week 1): Pt will be able to don shorts over feet with mod A. OT Short Term Goal 4 (Week 1): Pt will be able to use R hand to wash L arm with min A.  Skilled Therapeutic Interventions/Progress Updates:  Pt greeted seated in w/c agreeable to OT intervention. Session focus on various therapeutic activities with a focus on transfers, sit<>stands, core strength and RUE coordination.  Pt transported to gym with total A for time mgmt. Pt completed squat pivot transfer from w/c>EOM with CGA. Pt completed x4 sit<>stands with EVA walker with CGA,attempted functional reach and toss in standing with RUE with pt having difficulty with grasp and release of bean bags. Pt heavily reliant on BUE support with pt quickly fatiguing with standing. Pt completed ball tosses back and forth with OTA to work on functional coordination with RUE and core strength and balance. Pt also completed functional reach task from sitting where pt used RUE to retrieve bean bags from OTA to toss into bucket. Pt completed additional squat pivot transfer from EOM>w/c with CGA. Pt transported back to room with total A where pt left up in w/c with safety belt activated and all needs within reach.                     Therapy Documentation Precautions:  Precautions Precautions: Fall, Cervical Required Braces or Orthoses: Cervical Brace Cervical Brace: Soft collar Restrictions Weight Bearing Restrictions: No Other Position/Activity  Restrictions: per MD orders, collar can be removed for shower, in bed and getting in the bathroom  Pain: pt reports pain in RUE offered rest breaks and repositioning as pain mgmt strategy.     Therapy/Group: Individual Therapy  Precious Haws 08/04/2021, 3:42 PM

## 2021-08-04 NOTE — Progress Notes (Signed)
Digital stimulation done at [place], smear of pasty stool noted in rectum. 2005 digital stimulation done again to moderate amount of stool. Bowel program compete rectum clear.

## 2021-08-04 NOTE — Plan of Care (Signed)
  Problem: Consults Goal: RH SPINAL CORD INJURY PATIENT EDUCATION Description:  See Patient Education module for education specifics.  Outcome: Progressing   Problem: SCI BOWEL ELIMINATION Goal: RH STG MANAGE BOWEL WITH ASSISTANCE Description: STG Manage Bowel with mod I Assistance. Outcome: Progressing Goal: RH STG SCI MANAGE BOWEL WITH MEDICATION WITH ASSISTANCE Description: STG SCI Manage bowel with medication with mod I assistance. Outcome: Progressing   Problem: SCI BLADDER ELIMINATION Goal: RH STG MANAGE BLADDER WITH ASSISTANCE Description: STG Manage Bladder With min Assistance Outcome: Progressing   Problem: RH SKIN INTEGRITY Goal: RH STG SKIN FREE OF INFECTION/BREAKDOWN Description: With min assist Outcome: Progressing Goal: RH STG ABLE TO PERFORM INCISION/WOUND CARE W/ASSISTANCE Description: STG Able To Perform Incision/Wound Care With min  Assistance. Outcome: Progressing   Problem: RH SAFETY Goal: RH STG ADHERE TO SAFETY PRECAUTIONS W/ASSISTANCE/DEVICE Description: STG Adhere to Safety Precautions With cues Assistance/Device. Outcome: Progressing   Problem: RH PAIN MANAGEMENT Goal: RH STG PAIN MANAGED AT OR BELOW PT'S PAIN GOAL Description: At or below level 4 Outcome: Progressing   Problem: RH KNOWLEDGE DEFICIT SCI Goal: RH STG INCREASE KNOWLEDGE OF SELF CARE AFTER SCI Description: Patient will be able to manage self care using handouts and educational resources with cues/reminders  Outcome: Progressing

## 2021-08-04 NOTE — Progress Notes (Signed)
Physical Therapy Session Note  Patient Details  Name: [name] MRN: [id number] Date of Birth: 1951-01-17  Today's Date: 08/04/2021 PT Individual Time: 1615-1700 PT Individual Time Calculation (min): 45 min   Short Term Goals: Week 1:  PT Short Term Goal 1 (Week 1): Pt will perform bed mobility consistently with minA. PT Short Term Goal 2 (Week 1): Pt wil perform sit to stand transfer consistently with minA. PT Short Term Goal 3 (Week 1): Pt will performs bed to chair transfer consistently with minA. PT Short Term Goal 4 (Week 1): Pt will ambulate x25' with modA +1 and LRAD.  Skilled Therapeutic Interventions/Progress Updates:    Pt received seated in w/c in room, agreeable to PT session. Pt reports some pain in his neck, declines intervention. Manual w/c propulsion x 10 ft with use of BUE at min A level, pt with difficulty grasping w/c rim due to numbness in hands. Squat pivot transfer with min A throughout session. Sit to supine CGA. NMES level 25 to R quad x 10 min. Pt exhibits at least 3/5 strength in muscle with no change in ability to perform AROM noted with estim, however pt does report improved ability to "feel" muscle contracting as compared to without estim. Pt returned to sitting with min A for trunk control. Pt requests to return to bed  at end of session, squat pivot with min A. Sit to supine CGA. Pt left seated in bed with needs in reach, bed alarm in place.  Therapy Documentation Precautions:  Precautions Precautions: Fall, Cervical Required Braces or Orthoses: Cervical Brace Cervical Brace: Soft collar Restrictions Weight Bearing Restrictions: No Other Position/Activity Restrictions: per MD orders, collar can be removed for shower, in bed and getting in the bathroom      Therapy/Group: Individual Therapy   Excell Seltzer, PT, DPT, CSRS  08/04/2021, 5:28 PM

## 2021-08-05 LAB — BASIC METABOLIC PANEL
Anion gap: 8 (ref 5–15)
BUN: 25 mg/dL — ABNORMAL HIGH (ref 8–23)
CO2: 29 mmol/L (ref 22–32)
Calcium: 9.1 mg/dL (ref 8.9–10.3)
Chloride: 96 mmol/L — ABNORMAL LOW (ref 98–111)
Creatinine, Ser: 1.15 mg/dL (ref 0.61–1.24)
GFR, Estimated: >60 mL/min (ref >60)
Glucose, Bld: 105 mg/dL — ABNORMAL HIGH (ref 70–99)
Potassium: 3.9 mmol/L (ref 3.5–5.1)
Sodium: 133 mmol/L — ABNORMAL LOW (ref 135–145)

## 2021-08-05 NOTE — Progress Notes (Signed)
Physical Therapy Session Note  Patient Details  Name: [name] MRN: [id number] Date of Birth: Dec 07, 1950  Today's Date: 08/05/2021 PT Individual Time: 0805-0900 PT Individual Time Calculation (min): 55 min   Short Term Goals: Week 1:  PT Short Term Goal 1 (Week 1): Pt will perform bed mobility consistently with minA. PT Short Term Goal 2 (Week 1): Pt wil perform sit to stand transfer consistently with minA. PT Short Term Goal 3 (Week 1): Pt will performs bed to chair transfer consistently with minA. PT Short Term Goal 4 (Week 1): Pt will ambulate x25' with modA +1 and LRAD.  Skilled Therapeutic Interventions/Progress Updates: Pt presented in bed agreeable to therapy. Pt c/o pain in B shoulders TTP at upper traps, rest breaks provided as needed. Per pt thinks related to increased rolling due to several BM's during night. PTA threaded pants total A and pt performed rolling L/R with supervision and use of bed rail to allow PTA to pull pants over hips. Pt then performed supine to sit with CGA and use of bed features. Performed squat pivot transfer to w/c CGA. Pt transported to day room for energy conservation and participated in Cybex Kinetron 50cm/sec 3 bouts x 1 min each for reciprocal activity and general conditioning. PTA encouraged pt to try to maintain neutral alignment of bilateral knees. Once completed pt transported to rehab gym and performed Sit to stand in parallel bars x 4 modA with PTA facilitating anterior weight shifting and anterior translation of hips once in standing to improve erect posture. While in standing pt performed toe taps ~x5-6 to target. First trial attempts pt with significant noted ataxia which improved on following 2 attempts when PTA placed 5lb ankle weights on pt. While performing seated rests pt performed seated toe taps to target ~x10 bilaterally. Pt transported back to room after activity and performed [facility] transfer with pt able to pull self up with CGA and  transferred to recliner. Pt left in recliner at end of session with seat alarm on, call bell within reach and current needs met.      Therapy Documentation Precautions:  Precautions Precautions: Fall, Cervical Required Braces or Orthoses: Cervical Brace Cervical Brace: Soft collar Restrictions Weight Bearing Restrictions: No Other Position/Activity Restrictions: per MD orders, collar can be removed for shower, in bed and getting in the bathroom General:   Vital Signs: Therapy Vitals Temp: 98.2 F (36.8 C) Pulse Rate: 69 Resp: 17 BP: 120/77 Patient Position (if appropriate): Sitting Oxygen Therapy SpO2: 99 % O2 Device: Room Air Pain: Pain Assessment Pain Score: 6  Mobility:   Locomotion :    Trunk/Postural Assessment :    Balance:   Exercises:   Other Treatments:      Therapy/Group: Individual Therapy  Whitni Pasquini 08/05/2021, 4:24 PM

## 2021-08-05 NOTE — Progress Notes (Signed)
Occupational Therapy Session Note  Patient Details  Name: [name] MRN: [id number] Date of Birth: 10-10-1951  Today's Date: 08/05/2021 OT Individual Time: 1400-1425 OT Individual Time Calculation (min): 25 min    Short Term Goals: Week 1:  OT Short Term Goal 1 (Week 1): Pt will be able to sit to stand to RW with mod A of 1 and hold stand for 30 sec in prep for LB self care. OT Short Term Goal 2 (Week 1): Pt will demonstrate improved UE strength to don tshirt with min A OT Short Term Goal 3 (Week 1): Pt will be able to don shorts over feet with mod A. OT Short Term Goal 4 (Week 1): Pt will be able to use R hand to wash L arm with min A.  Skilled Therapeutic Interventions/Progress Updates:    Pt resting in recliner upon arrival. NMES RUE for finger/wrist flexion (see below) while using UE in functional task of grasping and releasing blue cup. Myofascial release for Rt upper trap with relief noted. Pt responded well to NMES and states he has more "feeling" now. Pt remained in recliner with all needs within reach.   1:1 NMES applied to RUE wrist and hand flexors to facilitate improved function and control  Ratio 1:3 Rate 35 pps Waveform- Asymmetric Ramp 1.0 Pulse 300 Intensity- 18  Duration -   15 mins   No adverse reactions after treatment and is skin intact.    Therapy Documentation Precautions:  Precautions Precautions: Fall, Cervical Required Braces or Orthoses: Cervical Brace Cervical Brace: Soft collar Restrictions Weight Bearing Restrictions: No Other Position/Activity Restrictions: per MD orders, collar can be removed for shower, in bed and getting in the bathroom   Pain:  Pt reports Rt upper trap pain (trigger point detected); myofascial release and repositioning with relief noted.   Therapy/Group: Individual Therapy  [name] 08/05/2021, 2:53 PM

## 2021-08-05 NOTE — Progress Notes (Signed)
PROGRESS NOTE   Subjective/Complaints:  Pt reports estim made him feel like had more feeling; and got good results per pt.   LBM this AM- but had good results with bowel program- feels "cleaned out".   Also has more feeling in hands- still limited dexterity- feels like "working down his palms".    ROS:  Pt denies SOB, abd pain, CP, N/V/C/D, and vision changes   Objective:   No results found. No results for input(s): WBC, HGB, HCT, PLT in the last 72 hours.  Recent Labs    08/05/21 0504  NA 133*  K 3.9  CL 96*  CO2 29  GLUCOSE 105*  BUN 25*  CREATININE 1.15  CALCIUM 9.1    Intake/Output Summary (Last 24 hours) at 08/05/2021 0851 Last data filed at 08/05/2021 5400 Gross per 24 hour  Intake 600 ml  Output 2501 ml  Net -1901 ml     Pressure Injury 08/03/21 Buttocks Left;Right Stage 1 -  Intact skin with non-blanchable redness of a localized area usually over a bony prominence. Pink, non blanching (Active)  08/03/21 2100  Location: Buttocks  Location Orientation: Left;Right  Staging: Stage 1 -  Intact skin with non-blanchable redness of a localized area usually over a bony prominence.  Wound Description (Comments): Pink, non blanching  Present on Admission:     Physical Exam: Vital Signs Blood pressure 121/67, pulse 65, temperature 97.9 F (36.6 C), resp. rate 20, height 5\' 11"  (1.803 m), weight 100.2 kg, SpO2 100 %.       General: awake, alert, appropriate,  sitting up in bed; NAD HENT: conjugate gaze; oropharynx moist CV: regular rate; no JVD Pulmonary: CTA B/L; no W/R/R- good air movement GI: soft, NT, ND, (+)BS Psychiatric: appropriate Neurological: Ox3  Decreased sensation still to S5- so neurogenic bowel- also has some improved sensation in palms compared to last week- LUE up to 5-/5 and RUE- 4- to 4/5- improved Genitourinary:    Comments: Foley in place- amber urine- no  change Musculoskeletal:  RLE- HF 3-/5, KE/DF and PF 4/5 LLE- 4+/5 in same muscles   Skin:    General: Skin is warm and dry.     Comments: Surgical site clean and dry.  Neurological:     Comments: Patient is alert.  Speech is dysphonic but intelligible.  No acute distress and follows commands.  Oriented to person place and time.   Decreased to light touch in pinprick from C4 to S5- checked all levels/dermatomes and worse in L1-S1 on L   Assessment/Plan: 1. Functional deficits which require 3+ hours per day of interdisciplinary therapy in a comprehensive inpatient rehab setting. Physiatrist is providing close team supervision and 24 hour management of active medical problems listed below. Physiatrist and rehab team continue to assess barriers to discharge/monitor patient progress toward functional and medical goals  Care Tool:  Bathing    Body parts bathed by patient: Chest, Abdomen, Right arm, Right upper leg, Left upper leg, Face   Body parts bathed by helper: Left arm, Front perineal area, Buttocks, Right lower leg, Left lower leg     Bathing assist Assist Level: Moderate Assistance - Patient 50 - 74%  Upper Body Dressing/Undressing Upper body dressing   What is the patient wearing?: Pull over shirt    Upper body assist Assist Level: Maximal Assistance - Patient 25 - 49%    Lower Body Dressing/Undressing Lower body dressing      What is the patient wearing?: Incontinence brief, Pants     Lower body assist Assist for lower body dressing: Total Assistance - Patient < 25%     Toileting Toileting    Toileting assist Assist for toileting: Dependent - Patient 0%     Transfers Chair/bed transfer  Transfers assist     Chair/bed transfer assist level: Minimal Assistance - Patient > 75%     Locomotion Ambulation   Ambulation assist      Assist level: 2 helpers Assistive device: Parallel bars Max distance: 5'   Walk 10 feet activity   Assist  Walk  10 feet activity did not occur: Safety/medical concerns        Walk 50 feet activity   Assist Walk 50 feet with 2 turns activity did not occur: Safety/medical concerns         Walk 150 feet activity   Assist Walk 150 feet activity did not occur: Safety/medical concerns         Walk 10 feet on uneven surface  activity   Assist Walk 10 feet on uneven surfaces activity did not occur: Safety/medical concerns         Wheelchair     Assist Is the patient using a wheelchair?: Yes Type of Wheelchair: Manual    Wheelchair assist level: Supervision/Verbal cueing Max wheelchair distance: 20'    Wheelchair 50 feet with 2 turns activity    Assist        Assist Level: Minimal Assistance - Patient > 75%   Wheelchair 150 feet activity     Assist      Assist Level: Moderate Assistance - Patient 50 - 74%   Blood pressure 121/67, pulse 65, temperature 97.9 F (36.6 C), resp. rate 20, height 5\' 11"  (1.803 m), weight 100.2 kg, SpO2 100 %.  Medical Problem List and Plan: 1.  Cervical myelopathy secondary to epidural hematoma status post epidural hematoma 07/22/2021 after ACDF 07/19/2021.  Cervical collar as directed             -patient may  shower if dressing covered             -ELOS/Goals: 10-12 days min A to supervision  Con't PT and OT/CIR_ team conference today to determine length of stay.  Con't PT and OT- CIR- d/c 10/25 2.  Antithrombotics: -DVT/anticoagulation:  Mechanical: Sequential compression devices, below knee Bilateral lower extremities.  Check vascular study  10/5- since had epidural hematoma, don't want to start Lovenox without d/w NSU- Doppler s (-)             -antiplatelet therapy: N/A 3. Pain Management: Robaxin/oxycodone as needed -will add Gabapentin 300 mg BID for now.   10/3- stinging/burning pain better- will maintain dose for now.    10/6- pain doing better and having more feeling- con't regien 4. Mood: Celexa 20 mg daily              -antipsychotic agents: N/A 5. Neuropsych: This patient is capable of making decisions on his own behalf. 6. Skin/Wound Care: Routine skin checks 7. Fluids/Electrolytes/Nutrition: Routine in and outs with follow-up chemistries 8.  Hypertension.  Lopressor 50 mg twice daily, lisinopril 10 mg daily, Demadex 20 mg twice daily.  Monitor with increased mobility 9.  BPH with urinary retention,likely neurogenic bladder.  Flomax 0.8 mg daily.  Keep foley for 1-2 days, then try to remove and check PVRs.   10/4- pt insistent wants to keep foley longer due to 1 handedness with urinal- will d/c Friday-don't feel comfortable keeping longer. On max dose Flomax- hx of SPC.  10.  Hyperlipidemia.  Crestor 11.  Hypothyroidism.  Synthroid 12.  CAD with CABG 2019.  Aspirin currently on hold 13.  Constipation/neurogenic bowel. Will change to Senokot and give Sorbitol 60cc x1 to get him to go.   10/4- LBM yesterday- doing well- con't regimen  10/5- will start Bowel program this evening- went over what it entails and how it works and takes 3-6 weeks to get into stable regimen. 10/6- bowel program went well- con't regimen nightly.   14.  GERD.  Protonix  15. Azotemia  10/3- Cr 1.20 which is baseline/slightly better; but BUN p to 34- up from 20s- will push fluids and recheck Thursday. 10/6- BUN 25 down from 34 and Cr 1.15- so also improved- will check weekly.       LOS: 4 days A FACE TO FACE EVALUATION WAS PERFORMED  [name] 08/05/2021, 8:51 AM

## 2021-08-05 NOTE — Progress Notes (Signed)
Occupational Therapy Session Note  Patient Details  Name: GEOFFRY BANNISTER MRN: [id number] Date of Birth: [birth year]  Today's Date: 08/05/2021 OT Individual Time: 1116-1200 OT Individual Time Calculation (min): 44 min   Short Term Goals: Week 1:  OT Short Term Goal 1 (Week 1): Pt will be able to sit to stand to RW with mod A of 1 and hold stand for 30 sec in prep for LB self care. OT Short Term Goal 2 (Week 1): Pt will demonstrate improved UE strength to don tshirt with min A OT Short Term Goal 3 (Week 1): Pt will be able to don shorts over feet with mod A. OT Short Term Goal 4 (Week 1): Pt will be able to use R hand to wash L arm with min A.  Skilled Therapeutic Interventions/Progress Updates:    Pt greeted seated in recliner and agreeable to OT treatment session. Squat-pivot from recliner to wc with mod A. OT propelled wc to therapy gym. Worked on fine motor control of B UE's with medium sized peg board task. Pt able to grasp peg with R hand, but could not coordinate peg into hole despite multiple attempts and getting frustrated. OT graded task by having him place pegs with L hand which has more coordination, then pick pegs up with R hand and place back in OT's hand. Focus on pincer grasp and in-hand manipulation of pegs. Used medium sized beach ball and worked on pressing ball from chest forward using equal pressure from both arms. Progressed to  B UE ball toss activity with improved control with repetition. Pt returned to room and completed squat-pivot back to recliner with mod A to get over recliner arm rest. Pt left seated with call bell in reach and needs met.  Therapy Documentation Precautions:  Precautions Precautions: Fall, Cervical Required Braces or Orthoses: Cervical Brace Cervical Brace: Soft collar Restrictions Weight Bearing Restrictions: No Other Position/Activity Restrictions: per MD orders, collar can be removed for shower, in bed and getting in the bathroom Pain:  Pt  reports 6/10 pain in neck, just received pain meds. Rest and repositioned for comfort as well.    Therapy/Group: Individual Therapy  Valma Cava 08/05/2021, 12:06 PM

## 2021-08-05 NOTE — Progress Notes (Signed)
Pt refused night time bowl program. States he will continue program tomorrow night, but does not want it tonight. Nurse educated pt on reason for bowl program, and encouraged pt to do take bowl programt, pt stated he understood why he needed it, but did not want it tonight, and will continue tomorrow.   Dayna Ramus

## 2021-08-05 NOTE — Progress Notes (Signed)
Occupational Therapy Session Note  Patient Details  Name: [name] MRN: [id number] Date of Birth: 22-Sep-1951  Today's Date: 08/05/2021 OT Individual Time: 0930-1040 OT Individual Time Calculation (min): 70 min    Short Term Goals: Week 1:  OT Short Term Goal 1 (Week 1): Pt will be able to sit to stand to RW with mod A of 1 and hold stand for 30 sec in prep for LB self care. OT Short Term Goal 2 (Week 1): Pt will demonstrate improved UE strength to don tshirt with min A OT Short Term Goal 3 (Week 1): Pt will be able to don shorts over feet with mod A. OT Short Term Goal 4 (Week 1): Pt will be able to use R hand to wash L arm with min A.  Skilled Therapeutic Interventions/Progress Updates:    Pt resting in recliner upon arrival and agreeable to therapy. Pt stated he needed to use toilet. Transfer to [facility] with Stedy-min A for sit<>stand. Pt continent of loose BM (charted in flowsheets). Tot A for toileting tasks. Pt transferred to w/c to complete bathing/dressing tasks. Bathing with mod A. UB dressing with min A. Pt able to don deodorant without assistance. LB dressing with tot A. Pt noted with significant BUE ataxia during funcitonal tasks. Pt reports that his hands have "more feeling" today. Pt returned to recliner with Stedy. Myofascial Release/soft tissue mobilizations for Rt upper trap trigger points. RUE positioned for comfort. Pt remained in recliner with all needs within reach.   Therapy Documentation Precautions:  Precautions Precautions: Fall, Cervical Required Braces or Orthoses: Cervical Brace Cervical Brace: Soft collar Restrictions Weight Bearing Restrictions: No Other Position/Activity Restrictions: per MD orders, collar can be removed for shower, in bed and getting in the bathroom   Pain: Pt c/o Bil shoulder pain R>L, 8/10; myofascial release and repositioning with some relief    Therapy/Group: Individual Therapy  [name] 08/05/2021, 10:50 AM

## 2021-08-05 NOTE — Evaluation (Signed)
Recreational Therapy Assessment and Plan  Patient Details  Name: [name] MRN: [id number] Date of Birth: 04/21/1951 Today's Date: 08/05/2021  Rehab Potential:  Good ELOS:   d/c 10/25  Assessment  Hospital Problem: Principal Problem:   Acute incomplete quadriplegia ([place]) Active Problems:   Epidural hematoma     Past Medical History:      Past Medical History:  Diagnosis Date   Allergy     Carpal tunnel syndrome     Chronic back pain     Chronic combined systolic and diastolic congestive heart failure (HCC)     Chronic ITP (idiopathic thrombocytopenia) ([place]) 05/25/2015   Coronary artery disease     Coronary artery disease involving native coronary artery of native heart with angina pectoris (HCC)     Degenerative disc disease     GERD (gastroesophageal reflux disease)     History of thrombocytopenia     Hypercholesteremia     Hypertension     Hypothyroid     Lumbar pain     Myocardial infarction ([place]) 2009   Obesity     Pneumonia      around age 66   S/P CABG x 3 04/27/2018    LIMA to LAD SVG to OM1 SVG to OM2   Shortness of breath dyspnea      Past Surgical History:       Past Surgical History:  Procedure Laterality Date   ANTERIOR CERVICAL DECOMP/DISCECTOMY FUSION N/A 07/19/2021    Procedure: CERVICAL THREE-FOUR ANTERIOR CERVICAL DECOMPRESSION/DISCECTOMY FUSION;  Surgeon: Oscar Kos, MD;  Location: [place];  Service: Neurosurgery;  Laterality: N/A;   ANTERIOR CERVICAL DECOMP/DISCECTOMY FUSION N/A 07/22/2021    Procedure: ANTERIOR CERVICAL DECOMPRESSION/DISCECTOMY REVISON FUSION  WITH EVACUATION OF EPIDURAL HEMATOMA;  Surgeon: Oscar Ro, DO;  Location: [place];  Service: Neurosurgery;  Laterality: N/A;   BACK SURGERY        5 lumbas disc with cervical and lumbar fusions   BIOPSY N/A 03/06/2014    Procedure: ESOPHAGEAL BIOPSIES;  Surgeon: Oscar Houston, MD;  Location: [facility];  Service: Endoscopy;  Laterality: N/A;   COLONOSCOPY       COLONOSCOPY WITH PROPOFOL  N/A 03/06/2014    Procedure: COLONOSCOPY WITH PROPOFOL;  Surgeon: Oscar Houston, MD;  Location: [facility];  Service: Endoscopy;  Laterality: N/A;  in cecum at 0807; total withdrawal time 9 minutes   CORONARY ANGIOPLASTY WITH STENT PLACEMENT        2000, and 2004 has 3 stents   CORONARY ARTERY BYPASS GRAFT N/A 04/27/2018    Procedure: CORONARY ARTERY BYPASS GRAFTING (CABG) x Three , using left internal mammary artery and right leg greater saphenous vein;  Surgeon: Oscar Alberts, MD;  Location: [place];  Service: Open Heart Surgery;  Laterality: N/A;   ESOPHAGOGASTRODUODENOSCOPY (EGD) WITH PROPOFOL N/A 03/06/2014    Procedure: ESOPHAGOGASTRODUODENOSCOPY (EGD) WITH PROPOFOL;  Surgeon: Oscar Houston, MD;  Location: [facility];  Service: Endoscopy;  Laterality: N/A;   LEFT HEART CATH AND CORONARY ANGIOGRAPHY N/A 04/24/2018    Procedure: LEFT HEART CATH AND CORONARY ANGIOGRAPHY;  Surgeon: Oscar Booze, MD;  Location: [place];  Service: Cardiovascular;  Laterality: N/A;   LUMBAR FUSION   2009   MALONEY DILATION N/A 03/06/2014    Procedure: MALONEY DILATION 54 french;  Surgeon: Oscar Houston, MD;  Location: [facility];  Service: Endoscopy;  Laterality: N/A;   NECK SURGERY       TEE WITHOUT CARDIOVERSION  N/A 04/27/2018    Procedure: TRANSESOPHAGEAL ECHOCARDIOGRAM (TEE);  Surgeon: Oscar Alberts, MD;  Location: [place];  Service: Open Heart Surgery;  Laterality: N/A;      Assessment & Plan Clinical Impression: Patient is a 70 year old right-handed male with history significant for ITP, hypertension, CAD with CABG 2019, hyperlipidemia.  Per chart review patient lives with spouse.  1 level home 3 steps to entry.  Independent with assistive device.  Patient with recent admission for C3/4 ACDF by [name] 07/19/2021 for cervical spondylitic myelopathy severe stenosis cord compression.  He was discharged home 07/20/2021 ambulating 400 feet supervision without assistive device.  Presented 07/22/2021 with  numbness tingling, lightening sensation and weakness going down his arms and legs while taking a shower.  Work-up and imaging showed epidural hematoma at C3-4 with cord compression.  Underwent revision arthrodesis C3-4 anterior interbody technique evacuation of epidural hematoma placement of intervertebral biomechanical device C3-4 07/22/2021 per [name].  Patient continues with cervical brace.  Tolerating a regular consistency diet.   Patient transferred to CIR on 08/01/2021.    Met with pt to discuss leisure interests, activity analysis & potential modifications.  Pt presents with decreased activity tolerance, decreased functional mobility, decreased balance decreased coordination Limiting pt's independence with leisure/community pursuits.  Plan   Min 1 TR session >20 minutes per week  Recommendations for other services: None   Discharge Criteria: Patient will be discharged from TR if patient refuses treatment 3 consecutive times without medical reason.  If treatment goals not met, if there is a change in medical status, if patient makes no progress towards goals or if patient is discharged from hospital.  The above assessment, treatment plan, treatment alternatives and goals were discussed and mutually agreed upon: by patient  [name] 08/05/2021, 3:56 PM

## 2021-08-06 MED ORDER — LIDOCAINE HCL URETHRAL/MUCOSAL 2 % EX GEL
1.0000 "application " | Freq: Once | CUTANEOUS | Status: AC
  Administered 2021-08-06: 1 via URETHRAL

## 2021-08-06 NOTE — Progress Notes (Signed)
Physical Therapy Session Note  Patient Details  Name: [name] MRN: [id number] Date of Birth: 1951/05/07  Today's Date: 08/06/2021 PT Individual Time: 0955-1115 PT Individual Time Calculation (min): 80 min   Short Term Goals: Week 1:  PT Short Term Goal 1 (Week 1): Pt will perform bed mobility consistently with minA. PT Short Term Goal 2 (Week 1): Pt wil perform sit to stand transfer consistently with minA. PT Short Term Goal 3 (Week 1): Pt will performs bed to chair transfer consistently with minA. PT Short Term Goal 4 (Week 1): Pt will ambulate x25' with modA +1 and LRAD.  Skilled Therapeutic Interventions/Progress Updates: Pt presented in bed agreeable to therapy. Pt pleased that he was able to take shower this am with OT but feels fatigued. Pt also states pain 6/10 at R neck/shoulder, rest breaks provided as needed. Pt performed supine to sit with CGA and use of bed features. Performed squat pivot to w/c CGA. Pt transported to day room and participated in Cybex Kinetron 40cm/sec 2 bouts x 2 minutes for general conditioning. Pt then transported to mat and performed squat pivot with CGA. Pt participated in several bouts of seated cornhole for forced use of BUE and dynamic seated balance. Pt able to reach with BUE and with increased time grasp bean bags with RUE and accurately toss to board. Pt did require tactile cues to decrease leaning forward and to attempt to extend RUE when reaching for bag. Pt also participated in ball kicks x 30 bilaterally for coordination with emphasis on accurately hitting ball. Pt returned to w/c via squat pivot then transported to rehab gym. PTA placed 5lb ankle weights on BLE and pt performed Sit to stand x 3. Pt demonstrated improved technique in standing in parallel bars this session with improved erect posture. In standing pt performed RLE forward/backward steps x 5. Once completed pt transported back to room and performed Stedy transfer to recliner. Pt left in  recliner at end of session with call bell within reach and needs met.       Therapy Documentation Precautions:  Precautions Precautions: Fall, Cervical Required Braces or Orthoses: Cervical Brace Cervical Brace: Soft collar Restrictions Weight Bearing Restrictions: No Other Position/Activity Restrictions: per MD orders, collar can be removed for shower, in bed and getting in the bathroom    Therapy/Group: Individual Therapy  Shelaine Frie Alani Sabbagh, PTA  08/06/2021, 12:45 PM

## 2021-08-06 NOTE — Plan of Care (Signed)
  Problem: SCI BLADDER ELIMINATION Goal: RH STG MANAGE BLADDER WITH ASSISTANCE Description: STG Manage Bladder With min Assistance 08/06/2021 1628 by Renda Rolls L, LPN Outcome: Progressing 08/06/2021 1625 by Renda Rolls L, LPN Outcome: Progressing   Problem: RH SKIN INTEGRITY Goal: RH STG SKIN FREE OF INFECTION/BREAKDOWN Description: With min assist 08/06/2021 1628 by Renda Rolls L, LPN Outcome: Progressing 08/06/2021 1625 by Renda Rolls L, LPN Outcome: Progressing Goal: RH STG ABLE TO PERFORM INCISION/WOUND CARE W/ASSISTANCE Description: STG Able To Perform Incision/Wound Care With min  Assistance. 08/06/2021 1628 by Renda Rolls L, LPN Outcome: Progressing 08/06/2021 1625 by Sanda Linger, LPN Outcome: Progressing   Problem: RH SKIN INTEGRITY Goal: RH STG ABLE TO PERFORM INCISION/WOUND CARE W/ASSISTANCE Description: STG Able To Perform Incision/Wound Care With min  Assistance. 08/06/2021 1628 by Renda Rolls L, LPN Outcome: Progressing 08/06/2021 1625 by Sanda Linger, LPN Outcome: Progressing   Problem: RH SAFETY Goal: RH STG ADHERE TO SAFETY PRECAUTIONS W/ASSISTANCE/DEVICE Description: STG Adhere to Safety Precautions With cues Assistance/Device. 08/06/2021 1628 by Renda Rolls L, LPN Outcome: Progressing 08/06/2021 1625 by Sanda Linger, LPN Outcome: Progressing   Problem: RH PAIN MANAGEMENT Goal: RH STG PAIN MANAGED AT OR BELOW PT'S PAIN GOAL Description: At or below level 4 08/06/2021 1628 by Dione Petron L, LPN Outcome: Progressing 08/06/2021 1625 by Renda Rolls L, LPN Outcome: Progressing   Problem: RH KNOWLEDGE DEFICIT SCI Goal: RH STG INCREASE KNOWLEDGE OF SELF CARE AFTER SCI Description: Patient will be able to manage self care using handouts and educational resources with cues/reminders  08/06/2021 1628 by Renda Rolls L, LPN Outcome: Progressing 08/06/2021 1625 by Sanda Linger, LPN Outcome: Progressing

## 2021-08-06 NOTE — Progress Notes (Signed)
PROGRESS NOTE   Subjective/Complaints:  Pt reports didn't do bowel program since was so tired last night.   Legs "won't move right".  Using foley- and was hoping to keep longer, but explained we need to see if can void at all- been on Flomax 0.8 mg daily- Asking for pass to see family outside.   ROS:  Pt denies SOB, abd pain, CP, N/V/C/D, and vision changes   Objective:   No results found. No results for input(s): WBC, HGB, HCT, PLT in the last 72 hours.  Recent Labs    08/05/21 0504  NA 133*  K 3.9  CL 96*  CO2 29  GLUCOSE 105*  BUN 25*  CREATININE 1.15  CALCIUM 9.1    Intake/Output Summary (Last 24 hours) at 08/06/2021 0840 Last data filed at 08/06/2021 0753 Gross per 24 hour  Intake 840 ml  Output 700 ml  Net 140 ml     Pressure Injury 08/03/21 Buttocks Left;Right Stage 1 -  Intact skin with non-blanchable redness of a localized area usually over a bony prominence. Pink, non blanching (Active)  08/03/21 2100  Location: Buttocks  Location Orientation: Left;Right  Staging: Stage 1 -  Intact skin with non-blanchable redness of a localized area usually over a bony prominence.  Wound Description (Comments): Pink, non blanching  Present on Admission:     Physical Exam: Vital Signs Blood pressure 125/77, pulse (!) 108, temperature 98.1 F (36.7 C), temperature source Oral, resp. rate 16, height 5\' 11"  (1.803 m), weight 101 kg, SpO2 100 %.        General: awake, alert, appropriate, sitting up in bed; NAD HENT: conjugate gaze; oropharynx moist CV: tachycardic rate; ; no JVD Pulmonary: CTA B/L; no W/R/R- good air movement GI: soft, NT, ND, (+)BS Psychiatric: appropriate Neurological: Ox3 Decreased sensation still to S5- so neurogenic bowel- also has some improved sensation in palms compared to last week- LUE up to 5-/5 and RUE- 4- to 4/5- improved Genitourinary:    Comments: Foley in place- amber  urine- no change Musculoskeletal:  RLE- HF 3-/5, KE/DF and PF 4/5 LLE- 4+/5 in same muscles   Skin:    General: Skin is warm and dry.     Comments: Surgical site clean and dry.  Neurological:     Comments: Patient is alert.  Speech is dysphonic but intelligible.  No acute distress and follows commands.  Oriented to person place and time.   Decreased to light touch in pinprick from C4 to S5- checked all levels/dermatomes and worse in L1-S1 on L   Assessment/Plan: 1. Functional deficits which require 3+ hours per day of interdisciplinary therapy in a comprehensive inpatient rehab setting. Physiatrist is providing close team supervision and 24 hour management of active medical problems listed below. Physiatrist and rehab team continue to assess barriers to discharge/monitor patient progress toward functional and medical goals  Care Tool:  Bathing    Body parts bathed by patient: Chest, Abdomen, Right arm, Right upper leg, Left upper leg, Face   Body parts bathed by helper: Right arm, Chest, Abdomen, Front perineal area, Right upper leg, Left upper leg, Face Body parts n/a: Left arm, Buttocks, Right lower  leg, Left lower leg   Bathing assist Assist Level: Moderate Assistance - Patient 50 - 74%     Upper Body Dressing/Undressing Upper body dressing   What is the patient wearing?: Pull over shirt    Upper body assist Assist Level: Minimal Assistance - Patient > 75%    Lower Body Dressing/Undressing Lower body dressing      What is the patient wearing?: Incontinence brief, Pants     Lower body assist Assist for lower body dressing: Total Assistance - Patient < 25%     Toileting Toileting    Toileting assist Assist for toileting: Dependent - Patient 0%     Transfers Chair/bed transfer  Transfers assist     Chair/bed transfer assist level: Minimal Assistance - Patient > 75%     Locomotion Ambulation   Ambulation assist      Assist level: 2  helpers Assistive device: Parallel bars Max distance: 5'   Walk 10 feet activity   Assist  Walk 10 feet activity did not occur: Safety/medical concerns        Walk 50 feet activity   Assist Walk 50 feet with 2 turns activity did not occur: Safety/medical concerns         Walk 150 feet activity   Assist Walk 150 feet activity did not occur: Safety/medical concerns         Walk 10 feet on uneven surface  activity   Assist Walk 10 feet on uneven surfaces activity did not occur: Safety/medical concerns         Wheelchair     Assist Is the patient using a wheelchair?: Yes Type of Wheelchair: Manual    Wheelchair assist level: Supervision/Verbal cueing Max wheelchair distance: 20'    Wheelchair 50 feet with 2 turns activity    Assist        Assist Level: Minimal Assistance - Patient > 75%   Wheelchair 150 feet activity     Assist      Assist Level: Moderate Assistance - Patient 50 - 74%   Blood pressure 125/77, pulse (!) 108, temperature 98.1 F (36.7 C), temperature source Oral, resp. rate 16, height 5\' 11"  (1.803 m), weight 101 kg, SpO2 100 %.  Medical Problem List and Plan: 1.  Cervical myelopathy secondary to epidural hematoma status post epidural hematoma 07/22/2021 after ACDF 07/19/2021.  Cervical collar as directed             -patient may  shower if dressing covered             -ELOS/Goals: 10-12 days min A to supervision  Con't PT and OT- CIR 2.  Antithrombotics: -DVT/anticoagulation:  Mechanical: Sequential compression devices, below knee Bilateral lower extremities.  Check vascular study  10/5- since had epidural hematoma, don't want to start Lovenox without d/w NSU- Doppler s (-)             -antiplatelet therapy: N/A 3. Pain Management: Robaxin/oxycodone as needed -will add Gabapentin 300 mg BID for now.   10/3- stinging/burning pain better- will maintain dose for now.    10/6- pain doing better and having more feeling-  con't regien 4. Mood: Celexa 20 mg daily             -antipsychotic agents: N/A 5. Neuropsych: This patient is capable of making decisions on his own behalf. 6. Skin/Wound Care: Routine skin checks 7. Fluids/Electrolytes/Nutrition: Routine in and outs with follow-up chemistries 8.  Hypertension.  Lopressor 50 mg twice daily, lisinopril  10 mg daily, Demadex 20 mg twice daily.  Monitor with increased mobility 9.  BPH with urinary retention,likely neurogenic bladder.  Flomax 0.8 mg daily.  Keep foley for 1-2 days, then try to remove and check PVRs.   10/4- pt insistent wants to keep foley longer due to 1 handedness with urinal- will d/c Friday-don't feel comfortable keeping longer. On max dose Flomax- hx of SPC.   10/7- will d/c foley today and do in/out caths- bladder scans q6 hours and cath for volumes >300cc- to decrease risk of UTI.  10.  Hyperlipidemia.  Crestor 11.  Hypothyroidism.  Synthroid 12.  CAD with CABG 2019.  Aspirin currently on hold 13.  Constipation/neurogenic bowel. Will change to Senokot and give Sorbitol 60cc x1 to get him to go.   10/4- LBM yesterday- doing well- con't regimen  10/5- will start Bowel program this evening- went over what it entails and how it works and takes 3-6 weeks to get into stable regimen. 10/7- refused bowel program last night- educated pt on bowel program againa nd needs to do nightly-.  14.  GERD.  Protonix  15. Azotemia  10/3- Cr 1.20 which is baseline/slightly better; but BUN p to 34- up from 20s- will push fluids and recheck Thursday. 10/6- BUN 25 down from 34 and Cr 1.15- so also improved- will check weekly.  16. Dispo 10/7- will allow therapeutic pass.      LOS: 5 days A FACE TO FACE EVALUATION WAS PERFORMED  [name] 08/06/2021, 8:40 AM

## 2021-08-06 NOTE — Progress Notes (Signed)
Occupational Therapy Session Note  Patient Details  Name: [name] MRN: [id number] Date of Birth: 1950/11/06  Today's Date: 08/06/2021 OT Individual Time: 6314-9702 OT Individual Time Calculation (min): 74 min    Short Term Goals: Week 1:  OT Short Term Goal 1 (Week 1): Pt will be able to sit to stand to RW with mod A of 1 and hold stand for 30 sec in prep for LB self care. OT Short Term Goal 2 (Week 1): Pt will demonstrate improved UE strength to don tshirt with min A OT Short Term Goal 3 (Week 1): Pt will be able to don shorts over feet with mod A. OT Short Term Goal 4 (Week 1): Pt will be able to use R hand to wash L arm with min A.  Skilled Therapeutic Interventions/Progress Updates:    Pt received in bed with no c/o pain and agreeable to OT session focusing on self-care. Pt motivated in therapy and pleasant to work with! Impulsive at times with transitions - pt would stand in stedy quickly without waiting for direction d/t excitement. Responsive with redirection. Overall, pt with c/o decreased sensation in all limbs with incoordination in R UE. Pt mildly frustrated at times with hand but was open to OT intervention for compensatory strategies.  ADL: Pt completes BADL at overall MIN A level. Skilled interventions include: Pt completes bed mobility supine to EOB with CGA. Able to direct care for picking out clothing and items needed for shower. Transfer to [facility] using Stedy with MIN A for sit > stand. Pt continent of BM (charted in flowsheets). Total A for peri-care. Aspen collar removed for shower, but donned again afterwards. Pt with R leg slipping back in stedy during sit >stand. Abnormal postural alignment noted in stedy with full extension L leg, R knee bent. Needed MIN directional cuing for re-adjustment. Pt transitioned to [facility] in shower with R leg blocked. Pt able to don shirt with supervision, required MIN A for LB d/t catheter threading through pants leg. Pt completes shower with  MIN A for management of handheld shower d/t significant ataxia of BUE. Discussed different options for bathing tools e.g. shower mitt with strap. Pt verbalizes fatigue at end of shower. Needed multimodal cuing for postural alignment in stedy (pt with heavy lean to R side and unaware). Pt able to don deodorant with setup. Required min A for donning shirt d/t lack of sensation in BUE. Pt able to push arms through shirt if assist was given to thread hands initially. LB dressing total A for management of catheter and pulling pants over hips while standing in stedy. Pt reports feeling tired and weak after shower but no c/o pain. Pt left at end of session in bed with exit alarm on, call light in reach and all needs met.     Therapy Documentation Precautions:  Precautions Precautions: Fall, Cervical Required Braces or Orthoses: Cervical Brace Cervical Brace: Soft collar Restrictions Weight Bearing Restrictions: No Other Position/Activity Restrictions: per MD orders, collar can be removed for shower, in bed and getting in the bathroom  Therapy/Group: Individual Therapy  Lexxus Underhill 08/06/2021, 6:57 AM

## 2021-08-06 NOTE — Progress Notes (Addendum)
Patient agreeable to bowel program. Pt positioned to L side, Digital stimulation done with large amount of mushy stool out. Pericare done. Foam applied on open areas on both butt cheeks.

## 2021-08-06 NOTE — Progress Notes (Signed)
Occupational Therapy Session Note  Patient Details  Name: [name] MRN: [id number] Date of Birth: [birth year]  Today's Date: 08/06/2021 OT Group Time: 2952-8413 OT Group Time Calculation (min): 60 min   Short Term Goals: Week 1:  OT Short Term Goal 1 (Week 1): Pt will be able to sit to stand to RW with mod A of 1 and hold stand for 30 sec in prep for LB self care. OT Short Term Goal 2 (Week 1): Pt will demonstrate improved UE strength to don tshirt with min A OT Short Term Goal 3 (Week 1): Pt will be able to don shorts over feet with mod A. OT Short Term Goal 4 (Week 1): Pt will be able to use R hand to wash L arm with min A.  Skilled Therapeutic Interventions/Progress Updates:  Pt participated in group session with a focus on stress mgmt, education on healthy coping strategies, and social interaction. Focus of session on providing coping strategies to manage new current level of function as a result of new diagnosis.  Session focus on breaking down stressors into "daily hassles," "major life stressors" and "life circumstances" in an effort to allow pts to chunk their stressors into groups. Pt actively sharing stressors and contributing to group conversation. Offered education on factors that protect [place] against stress such as "daily uplifts," "healthy coping strategies" and "protective factors." Encouraged all group members to make an effort to actively recall one event from their day that was a daily uplift in an effort to protect their mindset from stressors. Issued pt handouts on healthy coping strategies to implement into routine. Pt transported back to room by this OTA where pt requested to return to bed. MIN A for squat pivot transfer back to bed, CGA for sit>supine.   Therapy Documentation Precautions:  Precautions Precautions: Fall, Cervical Required Braces or Orthoses: Cervical Brace Cervical Brace: Soft collar Restrictions Weight Bearing Restrictions: No Other  Position/Activity Restrictions: per MD orders, collar can be removed for shower, in bed and getting in the bathroom  Pain: Pt reports no pain during group session    Therapy/Group: Group Therapy  Precious Haws 08/06/2021, 4:10 PM

## 2021-08-06 NOTE — Plan of Care (Signed)
  Problem: Consults Goal: RH SPINAL CORD INJURY PATIENT EDUCATION Description:  See Patient Education module for education specifics.  Outcome: Progressing   Problem: SCI BOWEL ELIMINATION Goal: RH STG MANAGE BOWEL WITH ASSISTANCE Description: STG Manage Bowel with mod I Assistance. Outcome: Progressing Goal: RH STG SCI MANAGE BOWEL WITH MEDICATION WITH ASSISTANCE Description: STG SCI Manage bowel with medication with mod I assistance. Outcome: Progressing   Problem: SCI BLADDER ELIMINATION Goal: RH STG MANAGE BLADDER WITH ASSISTANCE Description: STG Manage Bladder With min Assistance Outcome: Progressing   Problem: RH SKIN INTEGRITY Goal: RH STG SKIN FREE OF INFECTION/BREAKDOWN Description: With min assist Outcome: Progressing Goal: RH STG ABLE TO PERFORM INCISION/WOUND CARE W/ASSISTANCE Description: STG Able To Perform Incision/Wound Care With min  Assistance. Outcome: Progressing   Problem: RH SAFETY Goal: RH STG ADHERE TO SAFETY PRECAUTIONS W/ASSISTANCE/DEVICE Description: STG Adhere to Safety Precautions With cues Assistance/Device. Outcome: Progressing   Problem: RH PAIN MANAGEMENT Goal: RH STG PAIN MANAGED AT OR BELOW PT'S PAIN GOAL Description: At or below level 4 Outcome: Progressing   Problem: RH KNOWLEDGE DEFICIT SCI Goal: RH STG INCREASE KNOWLEDGE OF SELF CARE AFTER SCI Description: Patient will be able to manage self care using handouts and educational resources with cues/reminders  Outcome: Progressing

## 2021-08-07 NOTE — Progress Notes (Signed)
Physical Therapy Session Note  Patient Details  Name: [name] MRN: [id number] Date of Birth: 26-Nov-1950  Today's Date: 08/07/2021 PT Individual Time: 0902-0945 PT Individual Time Calculation (min): 43 min   Short Term Goals: Week 1:  PT Short Term Goal 1 (Week 1): Pt will perform bed mobility consistently with minA. PT Short Term Goal 2 (Week 1): Pt wil perform sit to stand transfer consistently with minA. PT Short Term Goal 3 (Week 1): Pt will performs bed to chair transfer consistently with minA. PT Short Term Goal 4 (Week 1): Pt will ambulate x25' with modA +1 and LRAD.   Skilled Therapeutic Interventions/Progress Updates:  Patient supine in bed on entrance to room. Patient alert and agreeable to PT session. Required changing of clothes prior to leaving room for session.   Patient with no pain complaint throughout session.  Therapeutic Activity: Bed Mobility: Pt able to lift LLE >RLE to assist with threading LE into pants. Is able to rock from side to side in order to don pants over hips bilaterally. Slowly brings BLE off EOB and is able to push UB up to upright seated position with supervision.   Transfers: Patient performed sit<>stand transfers throughout session with ModA to stand to RW as well as MinA to parallel bars. Stand pivot transfers throughout session with ModA. Provided verbal cues for technique.  Wheelchair Mobility:  Patient propelled wheelchair 25 feet x2 with CGA. Pt reaches fatigue in BUE and has to stop. Provided vc for technique, direction, and effort.  Neuromuscular Re-ed: NMR facilitated during session with focus on LE muscle activation, standing balance/ tolerance, pregait training. Pt guided in weight shifting, fwd/ bkwd stepping, toe touches to target, minisquats x10, and heel raises x10.  NMR performed for improvements in motor control and coordination, balance, sequencing, judgement, and self confidence/ efficacy in performing all aspects of mobility at  highest level of independence.   Patient supine  in bed at end of session with brakes locked, bed alarm set, and all needs within reach.     Therapy Documentation Precautions:  Precautions Precautions: Fall, Cervical Required Braces or Orthoses: Cervical Brace Cervical Brace: Soft collar Restrictions Weight Bearing Restrictions: No Other Position/Activity Restrictions: per MD orders, collar can be removed for shower, in bed and getting in the bathroom  Pain: No pain complaint throughout session.   Therapy/Group: Individual Therapy  Alger Simons PT, DPT 08/07/2021, 8:58 AM

## 2021-08-07 NOTE — Progress Notes (Signed)
Occupational Therapy Session Note  Patient Details  Name: [name] MRN: [id number] Date of Birth: 1951/09/24  Today's Date: 08/07/2021 OT Individual Time: 1022-1101 OT Individual Time Calculation (min): 39 min    Short Term Goals: Week 1:  OT Short Term Goal 1 (Week 1): Pt will be able to sit to stand to RW with mod A of 1 and hold stand for 30 sec in prep for LB self care. OT Short Term Goal 2 (Week 1): Pt will demonstrate improved UE strength to don tshirt with min A OT Short Term Goal 3 (Week 1): Pt will be able to don shorts over feet with mod A. OT Short Term Goal 4 (Week 1): Pt will be able to use R hand to wash L arm with min A.  Skilled Therapeutic Interventions/Progress Updates:    Pt received semi-reclined in bed with soft c collar donned, c/o R shoulder pain with use, did not rate, agreeable to therapy. Session focus on self-care retraining, activity tolerance, RUE NMR/coordination, in prep for improved ADL/IADL/func mobility performance + decreased caregiver burden.  Came to sitting EOB with CGA. Squat-pivot x2 with CGA, as well. Total A w/c transport to and from gym for time management and energy conservation.   Seated at [facility], completed the following games with RUE to target UE coordination/ataxia:   -Single Target: 48.15%, 2 min, 4.58 reaction time, 26 hits  -Tracing Shapes: able to trace various shapes with 90 + % coverage  1:1 NMES applied to R digit/wrist extensors in prep for functional Return at below settings. Pt able to ind facilitate grasp and release on cup, req min to mod A to stabilize cup to avoid spilling water as he simulated taking a sip. Reviewed  compensatory techniques, as well to facilitate independence. Reports use of weighted mug and straw or BUE on cup is going well.   Ratio 1:3 Rate 35 pps Waveform- Asymmetric Ramp 1.0 Pulse 824 Intensity- 20 clicks Duration -  8 min  No adverse reactions after treatment and is skin intact.  Denies  pain.  Returned to supine with S.  Pt left semi-reclined in bed with bed alarm engaged, call bell in reach, and all immediate needs met.    Therapy Documentation Precautions:  Precautions Precautions: Fall, Cervical Required Braces or Orthoses: Cervical Brace Cervical Brace: Soft collar Restrictions Weight Bearing Restrictions: No Other Position/Activity Restrictions: per MD orders, collar can be removed for shower, in bed and getting in the bathroom  Pain: see session note ADL: See Care Tool for more details.  Therapy/Group: Individual Therapy  Volanda Napoleon MS, OTR/L  08/07/2021, 6:55 AM

## 2021-08-07 NOTE — Progress Notes (Signed)
PROGRESS NOTE   Subjective/Complaints: Realizes that he needs caths to empty bladder. Did do bowel program last night with results  ROS: Patient denies fever, rash, sore throat, blurred vision, nausea, vomiting, diarrhea, cough, shortness of breath or chest pain, joint or back pain, headache, or mood change.    Objective:   No results found. No results for input(s): WBC, HGB, HCT, PLT in the last 72 hours.  Recent Labs    08/05/21 0504  NA 133*  K 3.9  CL 96*  CO2 29  GLUCOSE 105*  BUN 25*  CREATININE 1.15  CALCIUM 9.1    Intake/Output Summary (Last 24 hours) at 08/07/2021 0854 Last data filed at 08/07/2021 0725 Gross per 24 hour  Intake 480 ml  Output 2400 ml  Net -1920 ml     Pressure Injury 08/03/21 Buttocks Left;Right Stage 1 -  Intact skin with non-blanchable redness of a localized area usually over a bony prominence. Pink, non blanching (Active)  08/03/21 2100  Location: Buttocks  Location Orientation: Left;Right  Staging: Stage 1 -  Intact skin with non-blanchable redness of a localized area usually over a bony prominence.  Wound Description (Comments): Pink, non blanching  Present on Admission:     Physical Exam: Vital Signs Blood pressure 135/70, pulse 70, temperature 97.7 F (36.5 C), temperature source Oral, resp. rate 18, height 5\' 11"  (1.803 m), weight 102.4 kg, SpO2 96 %.        Constitutional: No distress . Vital signs reviewed. HEENT: NCAT, EOMI, oral membranes moist Neck: supple Cardiovascular: RRR without murmur. No JVD    Respiratory/Chest: CTA Bilaterally without wheezes or rales. Normal effort    GI/Abdomen: BS +, non-tender, non-distended Ext: no clubbing, cyanosis, or edema Psych: pleasant and cooperative  Skin: surgical site clean and dry, right buttock wound Neurological: Ox3 Decreased sensation still to S5- so neurogenic bowel- also has some improved sensation in palms  compared to last week- LUE up to 5-/5 and RUE- 4- to 4/5- improved--struggles with grip in right hand RLE- HF 3-/5, KE/DF and PF 4/5 LLE- 4+/5 in same muscles     Comments: Patient is alert.  Speech is dysphonic but intelligible.  No acute distress and follows commands.  Oriented to person place and time.   Decreased to light touch in pinprick from C4 to S5-   worse in L1-S1 on L   Assessment/Plan: 1. Functional deficits which require 3+ hours per day of interdisciplinary therapy in a comprehensive inpatient rehab setting. Physiatrist is providing close team supervision and 24 hour management of active medical problems listed below. Physiatrist and rehab team continue to assess barriers to discharge/monitor patient progress toward functional and medical goals  Care Tool:  Bathing    Body parts bathed by patient: Right arm, Left arm, Chest, Abdomen, Buttocks, Front perineal area, Right upper leg, Left upper leg, Right lower leg, Face, Left lower leg   Body parts bathed by helper: Right arm, Chest, Abdomen, Front perineal area, Right upper leg, Left upper leg, Face Body parts n/a: Left arm, Buttocks, Right lower leg, Left lower leg   Bathing assist Assist Level: Minimal Assistance - Patient > 75%  Upper Body Dressing/Undressing Upper body dressing   What is the patient wearing?: Pull over shirt    Upper body assist Assist Level: Minimal Assistance - Patient > 75%    Lower Body Dressing/Undressing Lower body dressing      What is the patient wearing?: Incontinence brief, Pants     Lower body assist Assist for lower body dressing: Total Assistance - Patient < 25%     Toileting Toileting    Toileting assist Assist for toileting: Total Assistance - Patient < 25%     Transfers Chair/bed transfer  Transfers assist     Chair/bed transfer assist level: Minimal Assistance - Patient > 75%     Locomotion Ambulation   Ambulation assist      Assist level: 2  helpers Assistive device: Parallel bars Max distance: 5'   Walk 10 feet activity   Assist  Walk 10 feet activity did not occur: Safety/medical concerns        Walk 50 feet activity   Assist Walk 50 feet with 2 turns activity did not occur: Safety/medical concerns         Walk 150 feet activity   Assist Walk 150 feet activity did not occur: Safety/medical concerns         Walk 10 feet on uneven surface  activity   Assist Walk 10 feet on uneven surfaces activity did not occur: Safety/medical concerns         Wheelchair     Assist Is the patient using a wheelchair?: Yes Type of Wheelchair: Manual    Wheelchair assist level: Supervision/Verbal cueing Max wheelchair distance: 20'    Wheelchair 50 feet with 2 turns activity    Assist        Assist Level: Minimal Assistance - Patient > 75%   Wheelchair 150 feet activity     Assist      Assist Level: Moderate Assistance - Patient 50 - 74%   Blood pressure 135/70, pulse 70, temperature 97.7 F (36.5 C), temperature source Oral, resp. rate 18, height 5\' 11"  (1.803 m), weight 102.4 kg, SpO2 96 %.  Medical Problem List and Plan: 1.  Cervical myelopathy secondary to epidural hematoma status post epidural hematoma 07/22/2021 after ACDF 07/19/2021.  Cervical collar as directed             -patient may  shower if dressing covered             -ELOS/Goals: 10-12 days min A to supervision  -Continue CIR therapies including PT, OT  2.  Antithrombotics: -DVT/anticoagulation:  Mechanical: Sequential compression devices, below knee Bilateral lower extremities.  Check vascular study  10/5- since had epidural hematoma, don't want to start Lovenox without d/w NSU- Doppler s (-)             -antiplatelet therapy: N/A 3. Pain Management: Robaxin/oxycodone as needed -will add Gabapentin 300 mg BID for now.   10/3- stinging/burning pain better- will maintain dose for now.   10/8- pain under reasonable  control 4. Mood: Celexa 20 mg daily             -antipsychotic agents: N/A 5. Neuropsych: This patient is capable of making decisions on his own behalf. 6. Skin/Wound Care: Routine skin checks 7. Fluids/Electrolytes/Nutrition: Routine in and outs with follow-up chemistries 8.  Hypertension.  Lopressor 50 mg twice daily, lisinopril 10 mg daily, Demadex 20 mg twice daily.  Monitor with increased mobility 9.  BPH with urinary retention,likely neurogenic bladder.  Flomax 0.8  mg daily.  Keep foley for 1-2 days, then try to remove and check PVRs.   10/4- pt insistent wants to keep foley longer due to 1 handedness with urinal- will d/c Friday-don't feel comfortable keeping longer. On max dose Flomax- hx of SPC.   10/8-foley d/c'ed yesterday, 2 I/o caths so far. May need to do more frequently--observe volumes today 10.  Hyperlipidemia.  Crestor 11.  Hypothyroidism.  Synthroid 12.  CAD with CABG 2019.  Aspirin currently on hold 13.  Constipation/neurogenic bowel. Will change to Senokot and give Sorbitol 60cc x1 to get him to go.   10/4- LBM yesterday- doing well- con't regimen  10/5- will start Bowel program this evening- went over what it entails and how it works and takes 3-6 weeks to get into stable regimen. 10/8 pt did bowel program last night with results 14.  GERD.  Protonix  15. Azotemia  10/3- Cr 1.20 which is baseline/slightly better; but BUN p to 34- up from 20s- will push fluids and recheck Thursday. 10/6- BUN 25 down from 34 and Cr 1.15- so also improved- will check weekly.  16. Dispo 10/7- will allow therapeutic pass.      LOS: 6 days A FACE TO FACE EVALUATION WAS PERFORMED  Meredith Staggers 08/07/2021, 8:54 AM

## 2021-08-07 NOTE — Plan of Care (Signed)
  Problem: Consults Goal: RH SPINAL CORD INJURY PATIENT EDUCATION Description:  See Patient Education module for education specifics.  Outcome: Progressing   Problem: SCI BOWEL ELIMINATION Goal: RH STG MANAGE BOWEL WITH ASSISTANCE Description: STG Manage Bowel with mod I Assistance. Outcome: Progressing Goal: RH STG SCI MANAGE BOWEL WITH MEDICATION WITH ASSISTANCE Description: STG SCI Manage bowel with medication with mod I assistance. Outcome: Progressing   Problem: SCI BLADDER ELIMINATION Goal: RH STG MANAGE BLADDER WITH ASSISTANCE Description: STG Manage Bladder With min Assistance Outcome: Progressing   Problem: RH SKIN INTEGRITY Goal: RH STG SKIN FREE OF INFECTION/BREAKDOWN Description: With min assist Outcome: Progressing Goal: RH STG ABLE TO PERFORM INCISION/WOUND CARE W/ASSISTANCE Description: STG Able To Perform Incision/Wound Care With min  Assistance. Outcome: Progressing   Problem: RH SAFETY Goal: RH STG ADHERE TO SAFETY PRECAUTIONS W/ASSISTANCE/DEVICE Description: STG Adhere to Safety Precautions With cues Assistance/Device. Outcome: Progressing   Problem: RH PAIN MANAGEMENT Goal: RH STG PAIN MANAGED AT OR BELOW PT'S PAIN GOAL Description: At or below level 4 Outcome: Progressing   Problem: RH KNOWLEDGE DEFICIT SCI Goal: RH STG INCREASE KNOWLEDGE OF SELF CARE AFTER SCI Description: Patient will be able to manage self care using handouts and educational resources with cues/reminders  Outcome: Progressing

## 2021-08-08 DIAGNOSIS — N319 Neuromuscular dysfunction of bladder, unspecified: Secondary | ICD-10-CM

## 2021-08-08 DIAGNOSIS — L8990 Pressure ulcer of unspecified site, unspecified stage: Secondary | ICD-10-CM | POA: Insufficient documentation

## 2021-08-08 DIAGNOSIS — L89309 Pressure ulcer of unspecified buttock, unspecified stage: Secondary | ICD-10-CM

## 2021-08-08 DIAGNOSIS — K592 Neurogenic bowel, not elsewhere classified: Secondary | ICD-10-CM

## 2021-08-08 NOTE — Plan of Care (Signed)
  Problem: Consults Goal: RH SPINAL CORD INJURY PATIENT EDUCATION Description:  See Patient Education module for education specifics.  Outcome: Progressing   Problem: SCI BOWEL ELIMINATION Goal: RH STG MANAGE BOWEL WITH ASSISTANCE Description: STG Manage Bowel with mod I Assistance. Outcome: Progressing Goal: RH STG SCI MANAGE BOWEL WITH MEDICATION WITH ASSISTANCE Description: STG SCI Manage bowel with medication with mod I assistance. Outcome: Progressing   Problem: SCI BLADDER ELIMINATION Goal: RH STG MANAGE BLADDER WITH ASSISTANCE Description: STG Manage Bladder With min Assistance Outcome: Progressing   Problem: RH SKIN INTEGRITY Goal: RH STG SKIN FREE OF INFECTION/BREAKDOWN Description: With min assist Outcome: Progressing Goal: RH STG ABLE TO PERFORM INCISION/WOUND CARE W/ASSISTANCE Description: STG Able To Perform Incision/Wound Care With min  Assistance. Outcome: Progressing   Problem: RH SAFETY Goal: RH STG ADHERE TO SAFETY PRECAUTIONS W/ASSISTANCE/DEVICE Description: STG Adhere to Safety Precautions With cues Assistance/Device. Outcome: Progressing   Problem: RH PAIN MANAGEMENT Goal: RH STG PAIN MANAGED AT OR BELOW PT'S PAIN GOAL Description: At or below level 4 Outcome: Progressing   Problem: RH KNOWLEDGE DEFICIT SCI Goal: RH STG INCREASE KNOWLEDGE OF SELF CARE AFTER SCI Description: Patient will be able to manage self care using handouts and educational resources with cues/reminders  Outcome: Progressing

## 2021-08-08 NOTE — Progress Notes (Signed)
Patient had an incontinent, loose, moderate amount of stool in bed; transferred to toilet and had moderate mushy stool with urine. Peri care done. Bladder scan showed pvr of 888 ml; in and out cath was 1350 ml. Digital stimulation done after transfer to bed and no stool obtained.

## 2021-08-08 NOTE — Progress Notes (Signed)
PROGRESS NOTE   Subjective/Complaints: Says he urinated a small amount one time yesterday but didn't realize he was emptying. Continues with I/O caths. Had mushy bm with bowel program last night  ROS: Patient denies fever, rash, sore throat, blurred vision, nausea, vomiting, diarrhea, cough, shortness of breath or chest pain, joint or back pain, headache, or mood change.    Objective:   No results found. No results for input(s): WBC, HGB, HCT, PLT in the last 72 hours.  No results for input(s): NA, K, CL, CO2, GLUCOSE, BUN, CREATININE, CALCIUM in the last 72 hours.   Intake/Output Summary (Last 24 hours) at 08/08/2021 0747 Last data filed at 08/08/2021 0500 Gross per 24 hour  Intake 660 ml  Output 2700 ml  Net -2040 ml     Pressure Injury 08/03/21 Buttocks Left;Right Stage 1 -  Intact skin with non-blanchable redness of a localized area usually over a bony prominence. Pink, non blanching (Active)  08/03/21 2100  Location: Buttocks  Location Orientation: Left;Right  Staging: Stage 1 -  Intact skin with non-blanchable redness of a localized area usually over a bony prominence.  Wound Description (Comments): Pink, non blanching  Present on Admission:     Physical Exam: Vital Signs Blood pressure 122/67, pulse 70, temperature 98.5 F (36.9 C), temperature source Oral, resp. rate 16, height 5\' 11"  (1.803 m), weight 98.6 kg, SpO2 97 %.  Constitutional: No distress . Vital signs reviewed. HEENT: NCAT, EOMI, oral membranes moist Neck: supple Cardiovascular: RRR without murmur. No JVD    Respiratory/Chest: CTA Bilaterally without wheezes or rales. Normal effort    GI/Abdomen: BS +, non-tender, non-distended Ext: no clubbing, cyanosis, or edema Psych: pleasant and cooperative  Skin: surgical site clean and dry,   buttock wounds Neurological: Alert and oriented x 3. Normal insight and awareness. Intact Memory. Normal  language and speech. Cranial nerve exam unremarkable  Decreased sensation still to S5- so neurogenic bowel- also has some improved sensation in palms compared to last week- LUE up to 5-/5 and RUE- 4- to 4/5- improved--struggles with grip in right hand RLE- HF 3-/5, KE/DF and PF 4/5 LLE- 4+/5 in same muscles  Decreased to light touch in pinprick from C4 to S5-   worse in L1-S1 on L   Assessment/Plan: 1. Functional deficits which require 3+ hours per day of interdisciplinary therapy in a comprehensive inpatient rehab setting. Physiatrist is providing close team supervision and 24 hour management of active medical problems listed below. Physiatrist and rehab team continue to assess barriers to discharge/monitor patient progress toward functional and medical goals  Care Tool:  Bathing    Body parts bathed by patient: Right arm, Left arm, Chest, Abdomen, Buttocks, Front perineal area, Right upper leg, Left upper leg, Right lower leg, Face, Left lower leg   Body parts bathed by helper: Right arm, Chest, Abdomen, Front perineal area, Right upper leg, Left upper leg, Face Body parts n/a: Left arm, Buttocks, Right lower leg, Left lower leg   Bathing assist Assist Level: Minimal Assistance - Patient > 75%     Upper Body Dressing/Undressing Upper body dressing   What is the patient wearing?: Pull over shirt  Upper body assist Assist Level: Minimal Assistance - Patient > 75%    Lower Body Dressing/Undressing Lower body dressing      What is the patient wearing?: Incontinence brief, Pants     Lower body assist Assist for lower body dressing: Total Assistance - Patient < 25%     Toileting Toileting    Toileting assist Assist for toileting: Total Assistance - Patient < 25%     Transfers Chair/bed transfer  Transfers assist     Chair/bed transfer assist level: Minimal Assistance - Patient > 75%     Locomotion Ambulation   Ambulation assist      Assist level: 2  helpers Assistive device: Parallel bars Max distance: 5'   Walk 10 feet activity   Assist  Walk 10 feet activity did not occur: Safety/medical concerns        Walk 50 feet activity   Assist Walk 50 feet with 2 turns activity did not occur: Safety/medical concerns         Walk 150 feet activity   Assist Walk 150 feet activity did not occur: Safety/medical concerns         Walk 10 feet on uneven surface  activity   Assist Walk 10 feet on uneven surfaces activity did not occur: Safety/medical concerns         Wheelchair     Assist Is the patient using a wheelchair?: Yes Type of Wheelchair: Manual    Wheelchair assist level: Supervision/Verbal cueing Max wheelchair distance: 20'    Wheelchair 50 feet with 2 turns activity    Assist        Assist Level: Minimal Assistance - Patient > 75%   Wheelchair 150 feet activity     Assist      Assist Level: Moderate Assistance - Patient 50 - 74%   Blood pressure 122/67, pulse 70, temperature 98.5 F (36.9 C), temperature source Oral, resp. rate 16, height 5\' 11"  (1.803 m), weight 98.6 kg, SpO2 97 %.  Medical Problem List and Plan: 1.  Cervical myelopathy secondary to epidural hematoma status post epidural hematoma 07/22/2021 after ACDF 07/19/2021.  Cervical collar as directed             -patient may  shower if dressing covered             -ELOS/Goals: 10-12 days min A to supervision  -Continue CIR therapies including PT, OT   2.  Antithrombotics: -DVT/anticoagulation:  Mechanical: Sequential compression devices, below knee Bilateral lower extremities.  Check vascular study  10/5- since had epidural hematoma, don't want to start Lovenox without d/w NSU- Doppler s (-)             -antiplatelet therapy: N/A 3. Pain Management: Robaxin/oxycodone as needed -will add Gabapentin 300 mg BID for now.   10/3- stinging/burning pain better- will maintain dose for now.   10/8- pain under reasonable  control 4. Mood: Celexa 20 mg daily             -antipsychotic agents: N/A 5. Neuropsych: This patient is capable of making decisions on his own behalf. 6. Skin/Wound Care: Routine skin checks. Foam dressings to buttocks 7. Fluids/Electrolytes/Nutrition: Routine in and outs with follow-up chemistries 8.  Hypertension.  Lopressor 50 mg twice daily, lisinopril 10 mg daily, Demadex 20 mg twice daily.  Monitor with increased mobility 9.  BPH with urinary retention,likely neurogenic bladder.  Flomax 0.8 mg daily.  Keep foley for 1-2 days, then try to remove and check  PVRs.   10/4- pt insistent wants to keep foley longer due to 1 handedness with urinal- will d/c Friday-don't feel comfortable keeping longer. On max dose Flomax- hx of SPC.   10/9- Still no sense of bladder fullness. I/O cath volumes inconsistent. Continue q6 schedule. May have to adjust timing with meds and fluid intake 10.  Hyperlipidemia.  Crestor 11.  Hypothyroidism.  Synthroid 12.  CAD with CABG 2019.  Aspirin currently on hold 13.  Constipation/neurogenic bowel. Will change to Senokot and give Sorbitol 60cc x1 to get him to go.   10/4- LBM yesterday- doing well- con't regimen  10/5- will start Bowel program this evening- went over what it entails and how it works and takes 3-6 weeks to get into stable regimen. 10/9 bowel program with results last 2 nights 14.  GERD.  Protonix  15. Azotemia  10/3- Cr 1.20 which is baseline/slightly better; but BUN p to 34- up from 20s- will push fluids and recheck Thursday. 10/6- BUN 25 down from 34 and Cr 1.15- so also improved- will check weekly.  16. Dispo 10/7- will allow therapeutic pass.      LOS: 7 days A FACE TO FACE EVALUATION WAS PERFORMED  Meredith Staggers 08/08/2021, 7:47 AM

## 2021-08-09 LAB — CBC WITH DIFFERENTIAL/PLATELET
Abs Immature Granulocytes: 0.05 10*3/uL (ref 0.00–0.07)
Basophils Absolute: 0 10*3/uL (ref 0.0–0.1)
Basophils Relative: 1 %
Eosinophils Absolute: 0.2 10*3/uL (ref 0.0–0.5)
Eosinophils Relative: 4 %
HCT: 35.3 % — ABNORMAL LOW (ref 39.0–52.0)
Hemoglobin: 11.7 g/dL — ABNORMAL LOW (ref 13.0–17.0)
Immature Granulocytes: 1 %
Lymphocytes Relative: 24 %
Lymphs Abs: 1.4 10*3/uL (ref 0.7–4.0)
MCH: 30.7 pg (ref 26.0–34.0)
MCHC: 33.1 g/dL (ref 30.0–36.0)
MCV: 92.7 fL (ref 80.0–100.0)
Monocytes Absolute: 0.4 10*3/uL (ref 0.1–1.0)
Monocytes Relative: 8 %
Neutro Abs: 3.7 10*3/uL (ref 1.7–7.7)
Neutrophils Relative %: 62 %
Platelets: 180 10*3/uL (ref 150–400)
RBC: 3.81 MIL/uL — ABNORMAL LOW (ref 4.22–5.81)
RDW: 12.6 % (ref 11.5–15.5)
WBC: 5.8 10*3/uL (ref 4.0–10.5)
nRBC: 0 % (ref 0.0–0.2)

## 2021-08-09 LAB — BASIC METABOLIC PANEL
Anion gap: 6 (ref 5–15)
BUN: 28 mg/dL — ABNORMAL HIGH (ref 8–23)
CO2: 32 mmol/L (ref 22–32)
Calcium: 9.1 mg/dL (ref 8.9–10.3)
Chloride: 97 mmol/L — ABNORMAL LOW (ref 98–111)
Creatinine, Ser: 1.32 mg/dL — ABNORMAL HIGH (ref 0.61–1.24)
GFR, Estimated: 58 mL/min — ABNORMAL LOW (ref >60)
Glucose, Bld: 99 mg/dL (ref 70–99)
Potassium: 4.8 mmol/L (ref 3.5–5.1)
Sodium: 135 mmol/L (ref 135–145)

## 2021-08-09 MED ORDER — METAXALONE 800 MG PO TABS
800.0000 mg | ORAL_TABLET | Freq: Four times a day (QID) | ORAL | Status: DC | PRN
  Administered 2021-08-09 – 2021-08-24 (×20): 800 mg via ORAL
  Filled 2021-08-09 (×25): qty 1

## 2021-08-09 MED ORDER — CALCIUM POLYCARBOPHIL 625 MG PO TABS
625.0000 mg | ORAL_TABLET | Freq: Every day | ORAL | Status: DC
  Administered 2021-08-09 – 2021-08-24 (×16): 625 mg via ORAL
  Filled 2021-08-09 (×16): qty 1

## 2021-08-09 MED ORDER — LISINOPRIL 5 MG PO TABS
5.0000 mg | ORAL_TABLET | Freq: Every day | ORAL | Status: DC
  Administered 2021-08-10 – 2021-08-14 (×5): 5 mg via ORAL
  Filled 2021-08-09 (×5): qty 1

## 2021-08-09 NOTE — Progress Notes (Signed)
Physical Therapy Session Note  Patient Details  Name: [name] MRN: [id number] Date of Birth: May 09, 1951  Today's Date: 08/09/2021 PT Individual Time: 1300-1343 PT Individual Time Calculation (min): 43 min   Short Term Goals: Week 1:  PT Short Term Goal 1 (Week 1): Pt will perform bed mobility consistently with minA. PT Short Term Goal 2 (Week 1): Pt wil perform sit to stand transfer consistently with minA. PT Short Term Goal 2 - Progress (Week 1): Met PT Short Term Goal 3 (Week 1): Pt will performs bed to chair transfer consistently with minA. PT Short Term Goal 3 - Progress (Week 1): Met PT Short Term Goal 4 (Week 1): Pt will ambulate x25' with modA +1 and LRAD. PT Short Term Goal 4 - Progress (Week 1): Progressing toward goal Week 2:  PT Short Term Goal 1 (Week 2): Pt will ambulate x 25 ft with LRAD and mod A PT Short Term Goal 2 (Week 2): Pt will perform least restrictive transfers with CGA consistently PT Short Term Goal 3 (Week 2): Pt will perform w/c mobility x 100 ft with Supervision  Skilled Therapeutic Interventions/Progress Updates:  Patient semireclined in bed on entrance to room. Patient alert and agreeable to PT session despite session time not included in pt's schedule. Pt relates not wanting "to turn away any extra therapy that I can get."  Patient with no pain complaint throughout session. Several seated therapeutic rest breaks for fatigue following efforts.   Therapeutic Activity: Bed Mobility: Soft neck collar already donned in bed. Patient performed supine <> sit with supervision. VC/ tc required for more of a roll to side with push up to seated position rather than straight use of abdominal for direct situp. Transfers: Patient performed sit<>stand and stand pivot transfers throughout session with CGA and no add'l assist provided for power up. Provided verbal cues for technique in foot and hand placement as well as increasing forward lean at hips with anterior  pelvic tilt rather than flexing forward at spine.   Wheelchair Mobility:  Patient propelled wheelchair ~30 feet with supervision. Provided vc for slowing arm thrusts in order to conserve energy.  Neuromuscular Re-ed: NMR facilitated during session with focus onstanding balance/ tolerance, RLE muscle activation, proprioception and motor control. Pt guided in seated toe touches to 3" step with focus on hip flexion to raise knee, then knee extension to bring toe to step. Then lift of LE prior to step down so as not to drag foot off step. Improved performance in sitting with foot clearance on and off step bilaterally. Progressed to standing and requiring AAROM initially to RLE for toe touches and then pt able to complete AROM. Pt able to improve control onto and off step throughout. Performed x10 bilaterally with each progression with seated therapeutic rest between bouts. Progressed to final bout with alternating toe touches x3 each LE. Initial 4 steps with good control and fatigue noted in final touches to step. Pt expected to initiate gait training in next therapy session, so  pt recommended to rest prior to next session in 70mn. NMR performed for improvements in motor control and coordination, balance, sequencing, judgement, and self confidence/ efficacy in performing all aspects of mobility at highest level of independence.   Patient seated upright  in w/c at end of session with brakes locked, belt alarm set, and all needs within reach.   Therapy Documentation Precautions:  Precautions Precautions: Fall, Cervical Required Braces or Orthoses: Cervical Brace Cervical Brace: Soft collar Restrictions  Weight Bearing Restrictions: No Other Position/Activity Restrictions: per MD orders, collar can be removed for shower, in bed and getting in the bathroom   Pain: Pt with no pain complaint this session. Only complaint is fatigue.   Therapy/Group: Individual Therapy  Alger Simons PT,  DPT 08/09/2021, 6:53 PM

## 2021-08-09 NOTE — Progress Notes (Signed)
Occupational Therapy Session Note  Patient Details  Name: [name] MRN: [id number] Date of Birth: May 18, 1951  Today's Date: 08/09/2021 OT Individual Time: 4920-1007 OT Individual Time Calculation (min): 60 min    Short Term Goals: Week 1:  OT Short Term Goal 1 (Week 1): Pt will be able to sit to stand to RW with mod A of 1 and hold stand for 30 sec in prep for LB self care. OT Short Term Goal 2 (Week 1): Pt will demonstrate improved UE strength to don tshirt with min A OT Short Term Goal 3 (Week 1): Pt will be able to don shorts over feet with mod A. OT Short Term Goal 4 (Week 1): Pt will be able to use R hand to wash L arm with min A.   Skilled Therapeutic Interventions/Progress Updates:    Pt semi reclined in bed.  Reports he needs to be cath'd since it has been a while.  Notified nurse and missed initial 15 minutes of treatment due to nursing care.  Pt reports having trouble grasping cups and food with right hand because of numbness and weakness.  Therapist assessed RUE strength and control: Right shoulder FF/ABD: 3-/5     ER:3-/5   IR 3+/5  Right Elbow flex/ext 4-/5     Forearm pro/sup 3+/5      Wrist ext/flex 3+/5  Right Digits:  + Froments sign (weak lateral pinch)              Unable to oppose thumb               Abnormal "OK" sign right thumb              + Wartenberg's sign (small digit abduction)   clawing of small digit with + Bouvier's sign  Pt exhibits weakness of the hand along the median and ulnar nerve distribution which greatly impacts both extrinsic and intrinsic musculature of all digits including the thumb and significantly hinders his functional use of his dominant hand.    Fabricated intrinsic plus assist using thermoplastic material and instructed pt to complete 2x 20 reps AAROM.  Then added resistive component to "dig" into resistive putty and pull with hand in intrinsic plus position x 8 reps then pt reporting fatigue.    1:1 NMES applied to right  Adductor pollicis (webspace of thumb) to strengthen lateral pinch while pt pinched resistive sponge in unison.   Ratio 1:3 Rate 35 pps Waveform- Asymmetric Ramp 4.0 Pulse 200 Intensity- 25 Duration -   20 minutes  Report of pain at the beginning of session none Report of pain at the end of session none  No adverse reactions after treatment and is skin intact.   Call bell in reach, bed alarm on at end of session.   Therapy Documentation Precautions:  Precautions Precautions: Fall, Cervical Required Braces or Orthoses: Cervical Brace Cervical Brace: Soft collar Restrictions Weight Bearing Restrictions: No Other Position/Activity Restrictions: per MD orders, collar can be removed for shower, in bed and getting in the bathroom    Therapy/Group: Individual Therapy  Ezekiel Slocumb 08/09/2021, 1:19 PM

## 2021-08-09 NOTE — Progress Notes (Signed)
PROGRESS NOTE   Subjective/Complaints:  They are cathing pt ~ q6 hours or so- but had a cath volume of [phone] in last 24 hours- will need to change caths to q4 hours due to volumes.   Had incontinent stool and then had bowel program- only small amount with program. Mushy stool still and diarrhea sometimes due to eating so much fruit.   Has reached a plateau with therapy due to lack of sensation.  Did see family yesterday - happy about that.    Also having muscle tightness- but sleepy on the meds. We discussed can change to Skelaxin prn for muscle spasms- not sedating.    ROS:  Pt denies SOB, abd pain, CP, N/V/C/D, and vision changes    Objective:   No results found. Recent Labs    08/09/21 0625  WBC 5.8  HGB 11.7*  HCT 35.3*  PLT 180    Recent Labs    08/09/21 0625  NA 135  K 4.8  CL 97*  CO2 32  GLUCOSE 99  BUN 28*  CREATININE 1.32*  CALCIUM 9.1     Intake/Output Summary (Last 24 hours) at 08/09/2021 0950 Last data filed at 08/09/2021 0400 Gross per 24 hour  Intake 480 ml  Output 3100 ml  Net -2620 ml     Pressure Injury 08/03/21 Buttocks Left;Right Stage 1 -  Intact skin with non-blanchable redness of a localized area usually over a bony prominence. Pink, non blanching (Active)  08/03/21 2100  Location: Buttocks  Location Orientation: Left;Right  Staging: Stage 1 -  Intact skin with non-blanchable redness of a localized area usually over a bony prominence.  Wound Description (Comments): Pink, non blanching  Present on Admission:     Physical Exam: Vital Signs Blood pressure 123/83, pulse 67, temperature 98 F (36.7 C), resp. rate 18, height 5\' 11"  (1.803 m), weight 98.8 kg, SpO2 97 %.   General: awake, alert, appropriate, laying supine in bed; wearing eyeglasses; NAD HENT: conjugate gaze; oropharynx moist CV: regular rate; no JVD Pulmonary: CTA B/L; no W/R/R- good air movement GI:  soft, NT, ND, (+)BS Psychiatric: appropriate, but sad/flat over lack of sensation- trying to put a "good face" on it! Neurological: alert; decreased sensation from C4 downwards- still an issue  Ext: no clubbing, cyanosis, or edema Psych: pleasant and cooperative  Skin: surgical site clean and dry,   buttock wounds Neurological: Alert and oriented x 3. Normal insight and awareness. Intact Memory. Normal language and speech. Cranial nerve exam unremarkable  Decreased sensation still to S5- so neurogenic bowel- also has some improved sensation in palms compared to last week- LUE up to 5-/5 and RUE- 4- to 4/5- improved--struggles with grip in right hand RLE- HF 3-/5, KE/DF and PF 4/5 LLE- 4+/5 in same muscles  Decreased to light touch in pinprick from C4 to S5-   worse in L1-S1 on L   Assessment/Plan: 1. Functional deficits which require 3+ hours per day of interdisciplinary therapy in a comprehensive inpatient rehab setting. Physiatrist is providing close team supervision and 24 hour management of active medical problems listed below. Physiatrist and rehab team continue to assess barriers to discharge/monitor patient progress toward  functional and medical goals  Care Tool:  Bathing    Body parts bathed by patient: Right arm, Left arm, Chest, Abdomen, Buttocks, Front perineal area, Right upper leg, Left upper leg, Right lower leg, Face, Left lower leg   Body parts bathed by helper: Right arm, Chest, Abdomen, Front perineal area, Right upper leg, Left upper leg, Face Body parts n/a: Left arm, Buttocks, Right lower leg, Left lower leg   Bathing assist Assist Level: Minimal Assistance - Patient > 75%     Upper Body Dressing/Undressing Upper body dressing   What is the patient wearing?: Pull over shirt    Upper body assist Assist Level: Minimal Assistance - Patient > 75%    Lower Body Dressing/Undressing Lower body dressing      What is the patient wearing?: Incontinence brief,  Pants     Lower body assist Assist for lower body dressing: Total Assistance - Patient < 25%     Toileting Toileting    Toileting assist Assist for toileting: Total Assistance - Patient < 25%     Transfers Chair/bed transfer  Transfers assist     Chair/bed transfer assist level: Minimal Assistance - Patient > 75%     Locomotion Ambulation   Ambulation assist      Assist level: 2 helpers Assistive device: Parallel bars Max distance: 5'   Walk 10 feet activity   Assist  Walk 10 feet activity did not occur: Safety/medical concerns        Walk 50 feet activity   Assist Walk 50 feet with 2 turns activity did not occur: Safety/medical concerns         Walk 150 feet activity   Assist Walk 150 feet activity did not occur: Safety/medical concerns         Walk 10 feet on uneven surface  activity   Assist Walk 10 feet on uneven surfaces activity did not occur: Safety/medical concerns         Wheelchair     Assist Is the patient using a wheelchair?: Yes Type of Wheelchair: Manual    Wheelchair assist level: Supervision/Verbal cueing Max wheelchair distance: 20'    Wheelchair 50 feet with 2 turns activity    Assist        Assist Level: Minimal Assistance - Patient > 75%   Wheelchair 150 feet activity     Assist      Assist Level: Moderate Assistance - Patient 50 - 74%   Blood pressure 123/83, pulse 67, temperature 98 F (36.7 C), resp. rate 18, height 5\' 11"  (1.803 m), weight 98.8 kg, SpO2 97 %.  Medical Problem List and Plan: 1.  Cervical myelopathy secondary to epidural hematoma status post epidural hematoma 07/22/2021 after ACDF 07/19/2021.  Cervical collar as directed             -patient may  shower if dressing covered             -ELOS/Goals: 10-12 days min A to supervision  -con't PT/OT /CIR 2.  Antithrombotics: -DVT/anticoagulation:  Mechanical: Sequential compression devices, below knee Bilateral lower  extremities.  Check vascular study  10/5- since had epidural hematoma, don't want to start Lovenox without d/w NSU- Doppler s (-)             -antiplatelet therapy: N/A 3. Pain Management: Robaxin/oxycodone as needed -will add Gabapentin 300 mg BID for now.   10/3- stinging/burning pain better- will maintain dose for now.   10/8- pain under reasonable control  10/10- will change Robaxin to skelaxin 800 mg QID prn since not sedating and see if can send home on it- possibly- if not, will send home on Robaxin.  4. Mood: Celexa 20 mg daily             -antipsychotic agents: N/A 5. Neuropsych: This patient is capable of making decisions on his own behalf. 6. Skin/Wound Care: Routine skin checks. Foam dressings to buttocks 7. Fluids/Electrolytes/Nutrition: Routine in and outs with follow-up chemistries 8.  Hypertension.  Lopressor 50 mg twice daily, lisinopril 10 mg daily, Demadex 20 mg twice daily.  Monitor with increased mobility  10/10- will reduce Lisinopril to 5 mg daily since Cr is up- it's also likely the Demadex, however need to see exactly if can reduce dose.  9.  BPH with urinary retention,likely neurogenic bladder.  Flomax 0.8 mg daily.  Keep foley for 1-2 days, then try to remove and check PVRs.   10/4- pt insistent wants to keep foley longer due to 1 handedness with urinal- will d/c Friday-don't feel comfortable keeping longer. On max dose Flomax- hx of SPC.   10/9- Still no sense of bladder fullness. I/O cath volumes inconsistent. Continue q6 schedule. May have to adjust timing with meds and fluid intake  10/10- will change I/o caths to q4 hours since volumes so high esp since had cath volume of 1350cc last night.  10.  Hyperlipidemia.  Crestor 11.  Hypothyroidism.  Synthroid 12.  CAD with CABG 2019.  Aspirin currently on hold 13.  Constipation/neurogenic bowel. Will change to Senokot and give Sorbitol 60cc x1 to get him to go.   10/4- LBM yesterday- doing well- con't regimen  10/5-  will start Bowel program this evening- went over what it entails and how it works and takes 3-6 weeks to get into stable regimen. 10/10- having incontinent stools 14.  GERD.  Protonix  15. Azotemia  10/3- Cr 1.20 which is baseline/slightly better; but BUN p to 34- up from 20s- will push fluids and recheck Thursday. 10/6- BUN 25 down from 34 and Cr 1.15- so also improved- will check weekly.  10/10- Cr up to 1.32 and BUN 28- likely due ot demadex 16. Dispo 10/7- will allow therapeutic pass.      LOS: 8 days A FACE TO FACE EVALUATION WAS PERFORMED  Colleen Donahoe 08/09/2021, 9:50 AM

## 2021-08-09 NOTE — Progress Notes (Signed)
Physical Therapy Weekly Progress Note  Patient Details  Name: [name] MRN: [id number] Date of Birth: [birth year]  Beginning of progress report period: August 02, 2021 End of progress report period: August 09, 2021  Today's Date: 08/09/2021 PT Individual Time: 1400-1500 PT Individual Time Calculation (min): 60 min   Patient has met 3 of 4 short term goals.  Pt is making good progress towards therapy goals. He is currently at [facility] level for bed mobility, min A fo squat pivot transfers, min A to stand in // bars, and has been able to initiate gait in // bars with assist x 2 for safety. Pt has also been able to perform very short distance w/c mobility with use of BUE but remains limited by decreased grip strength and coordination. Pt also exhibits ongoing ataxia in limbs and decreased control of UE and LE.  Patient continues to demonstrate the following deficits muscle weakness, decreased cardiorespiratoy endurance, impaired timing and sequencing, abnormal tone, unbalanced muscle activation, ataxia, and decreased coordination, and decreased sitting balance, decreased standing balance, decreased postural control, decreased balance strategies, and difficulty maintaining precautions and therefore will continue to benefit from skilled PT intervention to increase functional independence with mobility.  Patient progressing toward long term goals..  Continue plan of care.  PT Short Term Goals Week 1:  PT Short Term Goal 1 (Week 1): Pt will perform bed mobility consistently with minA. PT Short Term Goal 2 (Week 1): Pt wil perform sit to stand transfer consistently with minA. PT Short Term Goal 2 - Progress (Week 1): Met PT Short Term Goal 3 (Week 1): Pt will performs bed to chair transfer consistently with minA. PT Short Term Goal 3 - Progress (Week 1): Met PT Short Term Goal 4 (Week 1): Pt will ambulate x25' with modA +1 and LRAD. PT Short Term Goal 4 - Progress (Week 1): Progressing toward  goal Week 2:  PT Short Term Goal 1 (Week 2): Pt will ambulate x 25 ft with LRAD and mod A PT Short Term Goal 2 (Week 2): Pt will perform least restrictive transfers with CGA consistently PT Short Term Goal 3 (Week 2): Pt will perform w/c mobility x 100 ft with Supervision  Skilled Therapeutic Interventions/Progress Updates:    Pt received seated in w/c in room, agreeable to PT session. Pt reports some pain in his neck and R upper trap at rest, nursing able to provide pain medication at beginning of session. Applied short-acting hot pack to R upper trap and performed some STM to region at end of session for pain management. Sit to stand with min A in // bars during session. Session focus on upright stance with wider BOS and hip extension. Use of mirror, verbal, and manual cueing for correcting posture. Pt with impaired proprioception in BLE, R>L, use of 5# ankle weights for improved proprioceptive input. Pt with narrow BOS in standing, use of wooden plank between LE to encourage wider BOS. Ambulatoin 3 x 10 ft in // bars with min A for balance, close w/c follow for safety. Pt exhibits ataxic limbs during gait and heavy reliance on // bars for balance. Pt exhibits decreased L knee flexion during gait as well. Pt requests to return to bed at end of session due to fatigue. Squat pivot transfer with min A. Sit to supine CGA. Pt left seated in bed with needs in reach, bed alarm in place.  Therapy Documentation Precautions:  Precautions Precautions: Fall, Cervical Required Braces or Orthoses: Cervical Brace Cervical  Brace: Soft collar Restrictions Weight Bearing Restrictions: No Other Position/Activity Restrictions: per MD orders, collar can be removed for shower, in bed and getting in the bathroom    Therapy/Group: Individual Therapy   Excell Seltzer, PT, DPT, CSRS 08/09/2021, 5:32 PM

## 2021-08-09 NOTE — Plan of Care (Signed)
  Problem: Consults Goal: RH SPINAL CORD INJURY PATIENT EDUCATION Description:  See Patient Education module for education specifics.  Outcome: Progressing   Problem: SCI BOWEL ELIMINATION Goal: RH STG MANAGE BOWEL WITH ASSISTANCE Description: STG Manage Bowel with mod I Assistance. Outcome: Progressing Goal: RH STG SCI MANAGE BOWEL WITH MEDICATION WITH ASSISTANCE Description: STG SCI Manage bowel with medication with mod I assistance. Outcome: Progressing   Problem: SCI BLADDER ELIMINATION Goal: RH STG MANAGE BLADDER WITH ASSISTANCE Description: STG Manage Bladder With min Assistance Outcome: Not Progressing   Problem: RH SKIN INTEGRITY Goal: RH STG SKIN FREE OF INFECTION/BREAKDOWN Description: With min assist Outcome: Progressing Goal: RH STG ABLE TO PERFORM INCISION/WOUND CARE W/ASSISTANCE Description: STG Able To Perform Incision/Wound Care With min  Assistance. Outcome: Progressing   Problem: RH SAFETY Goal: RH STG ADHERE TO SAFETY PRECAUTIONS W/ASSISTANCE/DEVICE Description: STG Adhere to Safety Precautions With cues Assistance/Device. Outcome: Progressing   Problem: RH PAIN MANAGEMENT Goal: RH STG PAIN MANAGED AT OR BELOW PT'S PAIN GOAL Description: At or below level 4 Outcome: Progressing   Problem: RH KNOWLEDGE DEFICIT SCI Goal: RH STG INCREASE KNOWLEDGE OF SELF CARE AFTER SCI Description: Patient will be able to manage self care using handouts and educational resources with cues/reminders  Outcome: Progressing

## 2021-08-10 MED ORDER — TORSEMIDE 20 MG PO TABS
20.0000 mg | ORAL_TABLET | Freq: Every day | ORAL | Status: DC
  Administered 2021-08-11 – 2021-08-24 (×14): 20 mg via ORAL
  Filled 2021-08-10 (×14): qty 1

## 2021-08-10 NOTE — Progress Notes (Signed)
Physical Therapy Session Note  Patient Details  Name: [name] MRN: [id number] Date of Birth: Jan 21, 1951  Today's Date: 08/10/2021 PT Individual Time: 1545-1630 PT Individual Time Calculation (min): 45 min   Short Term Goals: Week 2:  PT Short Term Goal 1 (Week 2): Pt will ambulate x 25 ft with LRAD and mod A PT Short Term Goal 2 (Week 2): Pt will perform least restrictive transfers with CGA consistently PT Short Term Goal 3 (Week 2): Pt will perform w/c mobility x 100 ft with Supervision  Skilled Therapeutic Interventions/Progress Updates:    Pt received seated in bed, agreeable to PT session. No complaints of pain. Bed mobility CGA this date with use of bedrail. Squat pivot transfer w/c to/from bed with min A, cues for safety and decreased speed. Sit to stand with min A in // bars. Standing alt L/R marches 2 x 10 reps with min A for balance. Pt continues to exhibit impaired BLE coordination. Applied 5# ankle weights for improved proprioceptive input. Ambulation 3 x 10 ft in // bars with min A for balance, close w/c follow for safety. Pt exhibits L knee locked in extension during gait and stance and decreased awareness of RLE, needs cues to advance RLE prior to taking another step with LLE. Utilized [employer] for [employer] for LE placement. Pt also appears more fatigued this date. Pt returned to bed at end of session, needs in reach, bed alarm in place.  Therapy Documentation Precautions:  Precautions Precautions: Fall, Cervical Required Braces or Orthoses: Cervical Brace Cervical Brace: Soft collar Restrictions Weight Bearing Restrictions: No Other Position/Activity Restrictions: per MD orders, collar can be removed for shower, in bed and getting in the bathroom     Therapy/Group: Individual Therapy   Excell Seltzer, PT, DPT, CSRS  08/10/2021, 5:40 PM

## 2021-08-10 NOTE — Progress Notes (Signed)
PROGRESS NOTE   Subjective/Complaints:  Explained to pt changed cath to q4 hours-  Sleeping better.  Getting sore from in/out caths- using lidocaine.  Pt reports he would like demadex reduced since it's likely the cause of BUN elevation since drinking well- good volumes.  Walked in parallel bars yesterday 4x- board between his legs Skelaxin worked well. Liked it 1x he took it.   ROS:   Pt denies SOB, abd pain, CP, N/V/C/D, and vision changes    Objective:   No results found. Recent Labs    08/09/21 0625  WBC 5.8  HGB 11.7*  HCT 35.3*  PLT 180    Recent Labs    08/09/21 0625  NA 135  K 4.8  CL 97*  CO2 32  GLUCOSE 99  BUN 28*  CREATININE 1.32*  CALCIUM 9.1     Intake/Output Summary (Last 24 hours) at 08/10/2021 8325 Last data filed at 08/10/2021 4982 Gross per 24 hour  Intake 920 ml  Output 2525 ml  Net -1605 ml     Pressure Injury 08/03/21 Buttocks Left;Right Stage 1 -  Intact skin with non-blanchable redness of a localized area usually over a bony prominence. Pink, non blanching (Active)  08/03/21 2100  Location: Buttocks  Location Orientation: Left;Right  Staging: Stage 1 -  Intact skin with non-blanchable redness of a localized area usually over a bony prominence.  Wound Description (Comments): Pink, non blanching  Present on Admission:     Physical Exam: Vital Signs Blood pressure 115/76, pulse 63, temperature 97.9 F (36.6 C), resp. rate 18, height 5\' 11"  (1.803 m), weight 99 kg, SpO2 98 %.     General: awake, alert, appropriate, supine in bed;  NAD HENT: conjugate gaze; oropharynx moist CV: regular rate; no JVD Pulmonary: CTA B/L; no W/R/R- good air movement GI: soft, NT, ND, (+)BS Psychiatric: appropriate Neurological: Ox3- sleepy Sensation to light touch decreased C4 downwards Ext: no clubbing, cyanosis, or edema Psych: pleasant and cooperative  Skin: surgical site clean and  dry,   buttock wounds Neurological: Alert and oriented x 3. Normal insight and awareness. Intact Memory. Normal language and speech. Cranial nerve exam unremarkable  Decreased sensation still to S5- so neurogenic bowel- also has some improved sensation in palms compared to last week- LUE up to 5-/5 and RUE- 4- to 4/5- improved--struggles with grip in right hand RLE- HF 3-/5, KE/DF and PF 4/5 LLE- 4+/5 in same muscles  Decreased to light touch in pinprick from C4 to S5-   worse in L1-S1 on L   Assessment/Plan: 1. Functional deficits which require 3+ hours per day of interdisciplinary therapy in a comprehensive inpatient rehab setting. Physiatrist is providing close team supervision and 24 hour management of active medical problems listed below. Physiatrist and rehab team continue to assess barriers to discharge/monitor patient progress toward functional and medical goals  Care Tool:  Bathing    Body parts bathed by patient: Right arm, Left arm, Chest, Abdomen, Buttocks, Front perineal area, Right upper leg, Left upper leg, Right lower leg, Face, Left lower leg   Body parts bathed by helper: Right arm, Chest, Abdomen, Front perineal area, Right upper leg, Left upper  leg, Face Body parts n/a: Left arm, Buttocks, Right lower leg, Left lower leg   Bathing assist Assist Level: Minimal Assistance - Patient > 75%     Upper Body Dressing/Undressing Upper body dressing   What is the patient wearing?: Pull over shirt    Upper body assist Assist Level: Minimal Assistance - Patient > 75%    Lower Body Dressing/Undressing Lower body dressing      What is the patient wearing?: Incontinence brief, Pants     Lower body assist Assist for lower body dressing: Total Assistance - Patient < 25%     Toileting Toileting    Toileting assist Assist for toileting: Total Assistance - Patient < 25%     Transfers Chair/bed transfer  Transfers assist     Chair/bed transfer assist level: Minimal  Assistance - Patient > 75%     Locomotion Ambulation   Ambulation assist      Assist level: 2 helpers Assistive device: Parallel bars Max distance: 10'   Walk 10 feet activity   Assist  Walk 10 feet activity did not occur: Safety/medical concerns  Assist level: 2 helpers Assistive device: Parallel bars   Walk 50 feet activity   Assist Walk 50 feet with 2 turns activity did not occur: Safety/medical concerns         Walk 150 feet activity   Assist Walk 150 feet activity did not occur: Safety/medical concerns         Walk 10 feet on uneven surface  activity   Assist Walk 10 feet on uneven surfaces activity did not occur: Safety/medical concerns         Wheelchair     Assist Is the patient using a wheelchair?: Yes Type of Wheelchair: Manual    Wheelchair assist level: Supervision/Verbal cueing Max wheelchair distance: 20'    Wheelchair 50 feet with 2 turns activity    Assist        Assist Level: Minimal Assistance - Patient > 75%   Wheelchair 150 feet activity     Assist      Assist Level: Moderate Assistance - Patient 50 - 74%   Blood pressure 115/76, pulse 63, temperature 97.9 F (36.6 C), resp. rate 18, height 5\' 11"  (1.803 m), weight 99 kg, SpO2 98 %.  Medical Problem List and Plan: 1.  Cervical myelopathy secondary to epidural hematoma status post epidural hematoma 07/22/2021 after ACDF 07/19/2021.  Cervical collar as directed             -patient may  shower if dressing covered             -ELOS/Goals: 10-12 days min A to supervision  Con't PT and OT- CIR_ team conference today to discuss pt progress.  2.  Antithrombotics: -DVT/anticoagulation:  Mechanical: Sequential compression devices, below knee Bilateral lower extremities.  Check vascular study  10/5- since had epidural hematoma, don't want to start Lovenox without d/w NSU- Doppler s (-)             -antiplatelet therapy: N/A 3. Pain Management: Robaxin/oxycodone  as needed -will add Gabapentin 300 mg BID for now.   10/3- stinging/burning pain better- will maintain dose for now.   10/8- pain under reasonable control  10/10- will change Robaxin to skelaxin 800 mg QID prn since not sedating and see if can send home on it- possibly- if not, will send home on Robaxin.   10/11- skelaxin worked well- con't regimen 4. Mood: Celexa 20 mg daily             -  antipsychotic agents: N/A 5. Neuropsych: This patient is capable of making decisions on his own behalf. 6. Skin/Wound Care: Routine skin checks. Foam dressings to buttocks 7. Fluids/Electrolytes/Nutrition: Routine in and outs with follow-up chemistries 8.  Hypertension.  Lopressor 50 mg twice daily, lisinopril 10 mg daily, Demadex 20 mg twice daily.  Monitor with increased mobility  10/10- will reduce Lisinopril to 5 mg daily since Cr is up- it's also likely the Demadex, however need to see exactly if can reduce dose.   10/11- reducing Demadex to 20 mg daily- to see if also helps kidney function- will recheck Thursday 9.  BPH with urinary retention,likely neurogenic bladder.  Flomax 0.8 mg daily.  Keep foley for 1-2 days, then try to remove and check PVRs.   10/4- pt insistent wants to keep foley longer due to 1 handedness with urinal- will d/c Friday-don't feel comfortable keeping longer. On max dose Flomax- hx of SPC.   10/9- Still no sense of bladder fullness. I/O cath volumes inconsistent. Continue q6 schedule. May have to adjust timing with meds and fluid intake  10/10- will change I/o caths to q4 hours since volumes so high esp since had cath volume of 1350cc last night.   10/11- caths q4 hours 10.  Hyperlipidemia.  Crestor 11.  Hypothyroidism.  Synthroid 12.  CAD with CABG 2019.  Aspirin currently on hold 13.  Constipation/neurogenic bowel. Will change to Senokot and give Sorbitol 60cc x1 to get him to go.   10/4- LBM yesterday- doing well- con't regimen  10/5- will start Bowel program this evening-  went over what it entails and how it works and takes 3-6 weeks to get into stable regimen. 10/10- having incontinent stools 14.  GERD.  Protonix  15. Azotemia  10/3- Cr 1.20 which is baseline/slightly better; but BUN p to 34- up from 20s- will push fluids and recheck Thursday. 10/6- BUN 25 down from 34 and Cr 1.15- so also improved- will check weekly.  10/10- Cr up to 1.32 and BUN 28- likely due ot demadex 10/11- reduced Demadex to 20 mg daily and will recheck labs Thursday. 16. Dispo 10/7- will allow therapeutic pass.    I spent a total of 36 minutes on total care- >50% on coordination of care- calling Urology- Dr Arman Bogus has retired= spoke with Dr Tresa Moore- on right meds- needs f/u outpt.   LOS: 9 days A FACE TO FACE EVALUATION WAS PERFORMED  [name] 08/10/2021, 9:21 AM

## 2021-08-10 NOTE — Progress Notes (Signed)
Occupational Therapy Session Note  Patient Details  Name: [name] MRN: [id number] Date of Birth: Aug 24, 1951  Today's Date: 08/10/2021 OT Individual Time: 1245-8099 OT Individual Time Calculation (min): 31 min    Short Term Goals: Week 1:  OT Short Term Goal 1 (Week 1): Pt will be able to sit to stand to RW with mod A of 1 and hold stand for 30 sec in prep for LB self care. OT Short Term Goal 1 - Progress (Week 1): Progressing toward goal OT Short Term Goal 2 (Week 1): Pt will demonstrate improved UE strength to don tshirt with min A OT Short Term Goal 2 - Progress (Week 1): Met OT Short Term Goal 3 (Week 1): Pt will be able to don shorts over feet with mod A. OT Short Term Goal 3 - Progress (Week 1): Met OT Short Term Goal 4 (Week 1): Pt will be able to use R hand to wash L arm with min A. OT Short Term Goal 4 - Progress (Week 1): Progressing toward goal Week 2:  OT Short Term Goal 1 (Week 2): Pt will be able to sit to stand to RW with mod A of 1 and hold stand for 30 sec in prep for LB self care. OT Short Term Goal 2 (Week 2): Pt will be able to use R hand to wash L arm with min A. OT Short Term Goal 3 (Week 2): Pt will bathing tasks with min A with sit<>stand from W/C or TTB OT Short Term Goal 4 (Week 2): Pt will complete LB dressing tasks with mod A sit<>stand to pull pants over hips  Skilled Therapeutic Interventions/Progress Updates:    Pt received in bed, no c/o pain and agreeable to OT session focusing on B UE FM strengthening activities in prep for eating and dressing. Pt with overall complaints of "brain not working properly" and decreased sensation in hands. Pt mildly impulsive with transfers.   ADL: Pt declines BADLs this tx as he showered earlier. Said his energy level was good.     Therapeutic activity Pt completes bed mobility supine > EOB with CGA. Transfer EOB > wc with MIN A. OTS pushed wc to gym for time management. Pt completes pegboard activity for increased  coordination and functional use of BUE. Pt attempted to place pegs with dominant R hand, but could not use pincer grasp to pick up pegs initially. OT graded activity down by wrapping pegs with [place] wrap and having pt switch hands. Pt able to use L hand to place pegs and use R hand to pull out with more success. Pt completes activity with MIN cuing for postural control and alignment to midline in wc. Pt with low frustration tolerance from incoordination/loss of sensation of dominant hand. OT facilitated breaks and encouragement as needed. Pt able to place pegs back in bag using L hand with 90% accuracy. Pt transferred wc > EOB with CGA. Pt left at end of session in bed with exit alarm on, call light in reach and all needs met.   Therapy Documentation Precautions:  Precautions Precautions: Fall, Cervical Required Braces or Orthoses: Cervical Brace Cervical Brace: Soft collar Restrictions Weight Bearing Restrictions: No Other Position/Activity Restrictions: per MD orders, collar can be removed for shower, in bed and getting in the bathroom General: Therapy/Group: Individual Therapy  Honor Frison 08/10/2021, 4:03 PM

## 2021-08-10 NOTE — Patient Care Conference (Signed)
Inpatient RehabilitationTeam Conference and Plan of Care Update Date: 08/10/2021   Time: 11:00 AM    Patient Name: [name]      Medical Record Number: [id number]  Date of Birth: [birth year] Sex: Male         Room/Bed: 4M04C/4M04C-01 Payor Info: Payor: [employer] MEDICARE / Plan: [facility] MEDICARE / Product Type: *No Product type* /    Admit Date/Time:  08/01/2021  3:32 PM  Primary Diagnosis:  Acute incomplete quadriplegia [facility])  Hospital Problems: Principal Problem:   Acute incomplete quadriplegia ([place]) Active Problems:   Epidural hematoma   Pressure injury of skin    Expected Discharge Date: Expected Discharge Date: 08/24/21  Team Members Present: Physician leading conference: Dr. Courtney Heys Social Worker Present: Ovidio Kin, LCSW Nurse Present: Dorthula Nettles, RN PT Present: Excell Seltzer, PT OT Present: Roanna Epley, Goodell, OT PPS Coordinator present : Gunnar Fusi, SLP     Current Status/Progress Goal Weekly Team Focus  Bowel/Bladder   Bowel program in place. Last BM 10/10. Q 6 hour PVR requiring I/O catheterization atleast x1 per shift.  Continent of bowel and bladder. Void indipendently without use of I/O catheterization  Regain bowel and bladder control   Swallow/Nutrition/ Hydration             ADL's   bathing-min/mod A: UB dressing-min A: LB dressing-tot A: functional transfers-tot A  Min A  BADL training, education, BUE strengthening/function   Mobility   CGA bed mobility, min A squat pivot transfer, min A to stand in // bars, gait +2 in // bars, mod A w/c mobility x 30 ft  Supervision overall  LE NMR, transfers, gait training   Communication             Safety/Cognition/ Behavioral Observations            Pain   Pain 5-8/10 to neck. Requiring PRN percocet and metaxalone.  <3  Assess pain Q shift and PRN   Skin   Anterior neck incision OTA. Breakdown to sacrum. Mepilex in place.  Remain free from further skin breakdown. Remain  free from signs/symptoms of infection.  Assess skin Q shift and PRN     Discharge Planning:  wife and daughter to assist, wife more limited with assist physical assist will fal on daughter who is trained CNA   Team Discussion: Urinary retention. Nursing to use Lidocaine when I&O cathing. Bowel program nightly. Not sleeping due to too many distractions. Stage 1 progressed to stage 2, covered with foam. Potentially coming from patient rubbing on sheets. Legs ataxic. Doing e-stem, helps to feel more in hand/legs. Showered today. Patient on target to meet rehab goals: yes, squat pivot min assist. Min/mod assist with shower. Mainly using left hand. May have to downgrade goals.  *See Care Plan and progress notes for long and short-term goals.   Revisions to Treatment Plan:  Adjusting medications, monitor daily lab work.  Teaching Needs: Family education, medication management, pain management, skin/wound care, bowel/bladder management, transfer training, gait training, balance training, endurance training, safety awareness.  Current Barriers to Discharge: Decreased caregiver support, Medical stability, Home enviroment access/layout, Neurogenic bowel and bladder, Wound care, Lack of/limited family support, Weight, Weight bearing restrictions, and Medication compliance  Possible Resolutions to Barriers: Family education with wife and daughter. Bowel/bladder management training. Wound care education with wife and daughter.     Medical Summary Current Status: urinary retention-in/out caths ordered q4 hours- LBM yesterday - pain 6-8/10- Stage II on backside; incomplete  quadriplegia  Barriers to Discharge: Decreased family/caregiver support;Home enviroment access/layout  Barriers to Discharge Comments: home with wife/limited and daughter CNA Possible Resolutions to [employer] Focus: added skelaxin for muscle spasms; decreased demadex due to Cr increased;  and added Fibercon for loose stools-  focus- on motor ataxia and Kidney function- doing estim RUE- d/c 10/25   Continued Need for Acute Rehabilitation Level of Care: The patient requires daily medical management by a physician with specialized training in physical medicine and rehabilitation for the following reasons: Direction of a multidisciplinary physical rehabilitation program to maximize functional independence : Yes Medical management of patient stability for increased activity during participation in an intensive rehabilitation regime.: Yes Analysis of laboratory values and/or radiology reports with any subsequent need for medication adjustment and/or medical intervention. : Yes   I attest that I was present, lead the team conference, and concur with the assessment and plan of the team.   Cristi Loron 08/10/2021, 4:45 PM

## 2021-08-10 NOTE — Progress Notes (Signed)
Occupational Therapy Session Note  Patient Details  Name: [name] MRN: [id number] Date of Birth: 1951/08/06  Today's Date: 08/10/2021 OT Individual Time: 9480-1655 OT Individual Time Calculation (min): 70 min    Short Term Goals: Week 1:  OT Short Term Goal 1 (Week 1): Pt will be able to sit to stand to RW with mod A of 1 and hold stand for 30 sec in prep for LB self care. OT Short Term Goal 2 (Week 1): Pt will demonstrate improved UE strength to don tshirt with min A OT Short Term Goal 3 (Week 1): Pt will be able to don shorts over feet with mod A. OT Short Term Goal 4 (Week 1): Pt will be able to use R hand to wash L arm with min A.  Skilled Therapeutic Interventions/Progress Updates:    Pt resting in w/c upon arrival. OT intervention with focus on BADL retraining including bathing at shower level and dressing with sit<>stand from w/c. Transfers and sit<>stand with Stedy. Min A for sit<>stand and standing balance. Bathing at min A. LB dressing with mod A. Pt continues to incorporate RUE into functional tasks with limited success. Pt with ongoing lack of sensation in BUE. Pt remained seated in w/c with all needs within reach and belt alarm activated.   Therapy Documentation Precautions:  Precautions Precautions: Fall, Cervical Required Braces or Orthoses: Cervical Brace Cervical Brace: Soft collar Restrictions Weight Bearing Restrictions: No Other Position/Activity Restrictions: per MD orders, collar can be removed for shower, in bed and getting in the bathroom   Pain: Pt reports 7/10 generalized pain; shower and repositioned  Therapy/Group: Individual Therapy  [name] 08/10/2021, 12:07 PM

## 2021-08-10 NOTE — Progress Notes (Signed)
Patient ID: [name], male   DOB: 08/02/1951, 70 y.o.   MRN: [id number]  SW made efforts to meet with pt in room to provide updates from team conference and d/c date 10/25, however, pt was sleeping. SW will make efforts to update.   *SW called pt wife [name] [phone]) and spoke with her dtr [name] since wife was in shower. SW provided updates from conference. Plan is for pt to d/c to home. Dtr reports as long as pt is a Min A, the care can be provided to him. Fam edu scheduled for Thursday (10/20) 10am until completed with wife and dtr.  Loralee Pacas, MSW, [place] Office: [phone] Cell: [phone] Fax: [phone]

## 2021-08-10 NOTE — Progress Notes (Signed)
Physical Therapy Session Note  Patient Details  Name: [name] MRN: [id number] Date of Birth: April 22, 1951  Today's Date: 08/10/2021 PT Individual Time: 1415-9733 PT Individual Time Calculation (min): 60 min   Short Term Goals: Week 1:  PT Short Term Goal 1 (Week 1): Pt will perform bed mobility consistently with minA. PT Short Term Goal 2 (Week 1): Pt wil perform sit to stand transfer consistently with minA. PT Short Term Goal 2 - Progress (Week 1): Met PT Short Term Goal 3 (Week 1): Pt will performs bed to chair transfer consistently with minA. PT Short Term Goal 3 - Progress (Week 1): Met PT Short Term Goal 4 (Week 1): Pt will ambulate x25' with modA +1 and LRAD. PT Short Term Goal 4 - Progress (Week 1): Progressing toward goal  Skilled Therapeutic Interventions/Progress Updates: Pt presents supine in bed and agreeable to therapy.  Pt transfers sup to sit using roll and CGA.  Pt sat EOB and lifted LEs to thread shorts over feet.  Pt able to pull shorts up to hips.  Pt transfers sit to stand w/ min A and verbal cues for sequencing and LE/UE placement.  Pt transfers bed > w/c w/ min A.  Pt wheeled to small gym for time conservation.  Pt performed multiple sit to stand transfers w/ min A but education for sequencing especially foot placement and R UE.  Pt performed standing and stacking/unstacking cones using LUE and then RUE.  Pt requires verbal cues for posture as well as knee extension.  Pt performed toe-taps to 3 3/4" platform w/ noted flexion of trunk.  Pt requires verbal cues for posture and LLE placement when returns to floor.  Pt performed 2 x 10 partial squats w/ same cueing and for speed.  Pt remained sitting in w/c w/ chair alarm on and all needsin reach.     Therapy Documentation Precautions:  Precautions Precautions: Fall, Cervical Required Braces or Orthoses: Cervical Brace Cervical Brace: Soft collar Restrictions Weight Bearing Restrictions: No Other Position/Activity  Restrictions: per MD orders, collar can be removed for shower, in bed and getting in the bathroom General:   Vital Signs:   Pain:7/10 neck and R trap, pain meds already given. Pain Assessment Pain Scale: 0-10 Pain Score: 7  Faces Pain Scale: Hurts a little bit Pain Intervention(s): Medication (See eMAR) PAINAD (Pain Assessment in Advanced Dementia) Breathing: normal Negative Vocalization: none Body Language: relaxed Consolability: no need to console     Therapy/Group: Individual Therapy  Ladoris Gene 08/10/2021, 9:46 AM

## 2021-08-10 NOTE — Progress Notes (Signed)
Occupational Therapy Weekly Progress Note  Patient Details  Name: [name] MRN: [id number] Date of Birth: 1951-10-31  Beginning of progress report period: August 02, 2021 End of progress report period: August 10, 2021  Patient has met 2 of 4 short term goals.  Pt is making steady progress with BADLs and sit<>stand/standing balance. PT completes bathing at shower level with mod A. Pt initiates use of RUE to assist with bathing but unable at this time. Sit<>stand in RW with mod A but unable to remove UE to assist with LB dressing tasks. Pt able to thread pants and don 1 sock but requires assistance to pull pants over hips or don sock on LUE. Pt with decreased sensation in BUE.   Patient continues to demonstrate the following deficits: muscle weakness and muscle joint tightness, decreased cardiorespiratoy endurance, impaired timing and sequencing, abnormal tone, unbalanced muscle activation, and decreased coordination, and decreased balance strategies and therefore will continue to benefit from skilled OT intervention to enhance overall performance with BADL and Reduce care partner burden.  Patient progressing toward long term goals..  Continue plan of care.  OT Short Term Goals Week 1:  OT Short Term Goal 1 (Week 1): Pt will be able to sit to stand to RW with mod A of 1 and hold stand for 30 sec in prep for LB self care. OT Short Term Goal 1 - Progress (Week 1): Progressing toward goal OT Short Term Goal 2 (Week 1): Pt will demonstrate improved UE strength to don tshirt with min A OT Short Term Goal 2 - Progress (Week 1): Met OT Short Term Goal 3 (Week 1): Pt will be able to don shorts over feet with mod A. OT Short Term Goal 3 - Progress (Week 1): Met OT Short Term Goal 4 (Week 1): Pt will be able to use R hand to wash L arm with min A. OT Short Term Goal 4 - Progress (Week 1): Progressing toward goal Week 2:  OT Short Term Goal 1 (Week 2): Pt will be able to sit to stand to RW with mod A  of 1 and hold stand for 30 sec in prep for LB self care. OT Short Term Goal 2 (Week 2): Pt will be able to use R hand to wash L arm with min A. OT Short Term Goal 3 (Week 2): Pt will bathing tasks with min A with sit<>stand from W/C or TTB OT Short Term Goal 4 (Week 2): Pt will complete LB dressing tasks with mod A sit<>stand to pull pants over hips     [name] 08/10/2021, 3:04 PM

## 2021-08-11 MED ORDER — SENNA 8.6 MG PO TABS
1.0000 | ORAL_TABLET | Freq: Every day | ORAL | Status: DC
  Filled 2021-08-11: qty 1

## 2021-08-11 NOTE — Progress Notes (Signed)
Occupational Therapy Session Note  Patient Details  Name: [name] MRN: [id number] Date of Birth: 04-May-1951  Today's Date: 08/11/2021 OT Individual Time: 0700-0800 OT Individual Time Calculation (min): 60 min    Short Term Goals: Week 2:  OT Short Term Goal 1 (Week 2): Pt will be able to sit to stand to RW with mod A of 1 and hold stand for 30 sec in prep for LB self care. OT Short Term Goal 2 (Week 2): Pt will be able to use R hand to wash L arm with min A. OT Short Term Goal 3 (Week 2): Pt will bathing tasks with min A with sit<>stand from W/C or TTB OT Short Term Goal 4 (Week 2): Pt will complete LB dressing tasks with mod A sit<>stand to pull pants over hips  Skilled Therapeutic Interventions/Progress Updates:    Pt resting in bed upon arrival. Pt states he was up most of the night with continuous bowel movements. Pt ready to get OOB and start his day. Supine>sit EOB with supervision. Pt completed dressing with sit<>stand from EOB. UB dressing with supervisoin. Pt threaded BLE into pants without assistance. Sit<>stand from EOB with CGA and CGA for standing balance while pt pulled pants over hips. Squat pivot transfer to w/c with CGA. Pt washed face seated in w/c at sink. Pt issued bath mitt and was able to complete task more efficiently. Pt issued tan theraputty and instructed in activity-making into ball and rolling out on table into "snake" with RUE and LUE with focus on finger extension. Pt remained in w/c with seat alarm activated. All needs within reach.   Therapy Documentation Precautions:  Precautions Precautions: Fall, Cervical Required Braces or Orthoses: Cervical Brace Cervical Brace: Soft collar Restrictions Weight Bearing Restrictions: No Other Position/Activity Restrictions: per MD orders, collar can be removed for shower, in bed and getting in the bathroom Pain: Pt denies pain this morning   Therapy/Group: Individual Therapy  [name] 08/11/2021, 8:17 AM

## 2021-08-11 NOTE — Progress Notes (Signed)
PROGRESS NOTE   Subjective/Complaints:  Pt reports having bad diarrhea since last night- has been on Senna 2 tabs daily- had good but slightly loose BM last night with bowel program, then started with very loose stools the rest of the night.   Volumes with in/out caths are improved per pt.  Has quit "crushing cans". Figuring out where hands are in space.    ROS:   Pt denies SOB, abd pain, CP, N/V/C/D, and vision changes   Objective:   No results found. Recent Labs    08/09/21 0625  WBC 5.8  HGB 11.7*  HCT 35.3*  PLT 180    Recent Labs    08/09/21 0625  NA 135  K 4.8  CL 97*  CO2 32  GLUCOSE 99  BUN 28*  CREATININE 1.32*  CALCIUM 9.1     Intake/Output Summary (Last 24 hours) at 08/11/2021 0853 Last data filed at 08/11/2021 0400 Gross per 24 hour  Intake 460 ml  Output 801 ml  Net -341 ml     Pressure Injury 08/10/21 Buttocks Left;Right Stage 2 -  Partial thickness loss of dermis presenting as a shallow open injury with a red, pink wound bed without slough. progressed from stage 1 to stage 2. (Active)  08/10/21 1658  Location: Buttocks  Location Orientation: Left;Right  Staging: Stage 2 -  Partial thickness loss of dermis presenting as a shallow open injury with a red, pink wound bed without slough.  Wound Description (Comments): progressed from stage 1 to stage 2.  Present on Admission:     Physical Exam: Vital Signs Blood pressure 135/70, pulse 60, temperature 97.6 F (36.4 C), resp. rate 18, height 5\' 11"  (1.803 m), weight 101.3 kg, SpO2 100 %.      General: awake, alert, appropriate, sitting EOB with OTA in room;  NAD HENT: conjugate gaze; oropharynx moist; R anterior incision looks great- healing very well CV: regular rate; no JVD Pulmonary: CTA B/L; no W/R/R- good air movement GI: soft, NT, ND, (+)BS; slightly hyperactive Psychiatric: appropriate Neurological: Ox3  Sensation to  light touch decreased C4 downwards Ext: no clubbing, cyanosis, or edema Psych: pleasant and cooperative  Skin: surgical site clean and dry,   buttock wounds Neurological: Alert and oriented x 3. Normal insight and awareness. Intact Memory. Normal language and speech. Cranial nerve exam unremarkable  Decreased sensation still to S5- so neurogenic bowel- also has some improved sensation in palms compared to last week- LUE up to 5-/5 and RUE- 4- to 4/5- improved--struggles with grip in right hand RLE- HF 3-/5, KE/DF and PF 4/5 LLE- 4+/5 in same muscles  Decreased to light touch in pinprick from C4 to S5-   worse in L1-S1 on L   Assessment/Plan: 1. Functional deficits which require 3+ hours per day of interdisciplinary therapy in a comprehensive inpatient rehab setting. Physiatrist is providing close team supervision and 24 hour management of active medical problems listed below. Physiatrist and rehab team continue to assess barriers to discharge/monitor patient progress toward functional and medical goals  Care Tool:  Bathing    Body parts bathed by patient: Right arm, Left arm, Chest, Abdomen, Front perineal area, Right upper  leg, Left upper leg, Right lower leg, Face   Body parts bathed by helper: Buttocks, Left lower leg, Right lower leg Body parts n/a: Left arm, Buttocks, Right lower leg, Left lower leg   Bathing assist Assist Level: Minimal Assistance - Patient > 75%     Upper Body Dressing/Undressing Upper body dressing   What is the patient wearing?: Pull over shirt    Upper body assist Assist Level: Supervision/Verbal cueing    Lower Body Dressing/Undressing Lower body dressing      What is the patient wearing?: Pants     Lower body assist Assist for lower body dressing: Minimal Assistance - Patient > 75%     Toileting Toileting    Toileting assist Assist for toileting: Total Assistance - Patient < 25%     Transfers Chair/bed transfer  Transfers assist      Chair/bed transfer assist level: Minimal Assistance - Patient > 75%     Locomotion Ambulation   Ambulation assist      Assist level: 2 helpers Assistive device: Parallel bars Max distance: 10'   Walk 10 feet activity   Assist  Walk 10 feet activity did not occur: Safety/medical concerns  Assist level: 2 helpers Assistive device: Parallel bars   Walk 50 feet activity   Assist Walk 50 feet with 2 turns activity did not occur: Safety/medical concerns         Walk 150 feet activity   Assist Walk 150 feet activity did not occur: Safety/medical concerns         Walk 10 feet on uneven surface  activity   Assist Walk 10 feet on uneven surfaces activity did not occur: Safety/medical concerns         Wheelchair     Assist Is the patient using a wheelchair?: Yes Type of Wheelchair: Manual    Wheelchair assist level: Supervision/Verbal cueing Max wheelchair distance: 20'    Wheelchair 50 feet with 2 turns activity    Assist        Assist Level: Minimal Assistance - Patient > 75%   Wheelchair 150 feet activity     Assist      Assist Level: Moderate Assistance - Patient 50 - 74%   Blood pressure 135/70, pulse 60, temperature 97.6 F (36.4 C), resp. rate 18, height 5\' 11"  (1.803 m), weight 101.3 kg, SpO2 100 %.  Medical Problem List and Plan: 1.  Cervical myelopathy secondary to epidural hematoma status post epidural hematoma 07/22/2021 after ACDF 07/19/2021.  Cervical collar as directed             -patient may  shower if dressing covered             -ELOS/Goals: 10-12 days min A to supervision  Con't PT/OT and CIR overall - working on bowels 2.  Antithrombotics: -DVT/anticoagulation:  Mechanical: Sequential compression devices, below knee Bilateral lower extremities.  Check vascular study  10/5- since had epidural hematoma, don't want to start Lovenox without d/w NSU- Doppler s (-)             -antiplatelet therapy: N/A 3. Pain  Management: Robaxin/oxycodone as needed -will add Gabapentin 300 mg BID for now.   10/3- stinging/burning pain better- will maintain dose for now.   10/8- pain under reasonable control  10/10- will change Robaxin to skelaxin 800 mg QID prn since not sedating and see if can send home on it- possibly- if not, will send home on Robaxin.   10/11- skelaxin worked well-  con't regimen 4. Mood: Celexa 20 mg daily             -antipsychotic agents: N/A 5. Neuropsych: This patient is capable of making decisions on his own behalf. 6. Skin/Wound Care: Routine skin checks. Foam dressings to buttocks 7. Fluids/Electrolytes/Nutrition: Routine in and outs with follow-up chemistries 8.  Hypertension.  Lopressor 50 mg twice daily, lisinopril 10 mg daily, Demadex 20 mg twice daily.  Monitor with increased mobility  10/10- will reduce Lisinopril to 5 mg daily since Cr is up- it's also likely the Demadex, however need to see exactly if can reduce dose.   10/11- reducing Demadex to 20 mg daily- to see if also helps kidney function- will recheck Thursday 9.  BPH with urinary retention,likely neurogenic bladder.  Flomax 0.8 mg daily.  Keep foley for 1-2 days, then try to remove and check PVRs.   10/4- pt insistent wants to keep foley longer due to 1 handedness with urinal- will d/c Friday-don't feel comfortable keeping longer. On max dose Flomax- hx of SPC.   10/9- Still no sense of bladder fullness. I/O cath volumes inconsistent. Continue q6 schedule. May have to adjust timing with meds and fluid intake  10/10- will change I/o caths to q4 hours since volumes so high esp since had cath volume of 1350cc last night.   10/11- caths q4 hours  10/12- cath volumes doing better- con't q4 hours- will have pt f/u with Dr Claudia Desanctis.  10.  Hyperlipidemia.  Crestor 11.  Hypothyroidism.  Synthroid 12.  CAD with CABG 2019.  Aspirin currently on hold 13.  Constipation/neurogenic bowel. Will change to Senokot and give Sorbitol 60cc x1 to  get him to go.   10/4- LBM yesterday- doing well- con't regimen  10/5- will start Bowel program this evening- went over what it entails and how it works and takes 3-6 weeks to get into stable regimen. 10/10- having incontinent stools 10/12- doing bowel program- now having diarrhea- stopped senokot for now and will monitor; On Fibercon 14.  GERD.  Protonix  15. Azotemia  10/3- Cr 1.20 which is baseline/slightly better; but BUN p to 34- up from 20s- will push fluids and recheck Thursday. 10/6- BUN 25 down from 34 and Cr 1.15- so also improved- will check weekly.  10/10- Cr up to 1.32 and BUN 28- likely due ot demadex 10/11- reduced Demadex to 20 mg daily and will recheck labs Thursday. 16. Dispo 10/7- will allow therapeutic pass.     LOS: 10 days A FACE TO FACE EVALUATION WAS PERFORMED  Witten Certain 08/11/2021, 8:53 AM

## 2021-08-11 NOTE — Progress Notes (Signed)
Physical Therapy Session Note  Patient Details  Name: [name] MRN: [id number] Date of Birth: 1951/01/16  Today's Date: 08/11/2021 PT Individual Time: 1005-1105 PT Individual Time Calculation (min): 60 min   Short Term Goals: Week 2:  PT Short Term Goal 1 (Week 2): Pt will ambulate x 25 ft with LRAD and mod A PT Short Term Goal 2 (Week 2): Pt will perform least restrictive transfers with CGA consistently PT Short Term Goal 3 (Week 2): Pt will perform w/c mobility x 100 ft with Supervision  Skilled Therapeutic Interventions/Progress Updates: Pt presented in w/c agreeable to therapy. Pt states some mild pain in upper trap/neck area, but no intervention requested. Pt transported to rehab gym and participated in Sit to stand and ambulation in parallel bars forward/backwards 74ft x 4. Pt was minA for Sit to stand and 4lb weights placed on BLE for feedback. Pt required cues for increasing step length of RLE and to properly advance RLE before stepping on LLE. Pt then taken out of parallel bars and participated in ambulation with [name] walker 6ftr x 2 with close w/c follow. Pt required modA due to decreased coordination of BLE. Pt continues to require cues to fully advance RLE as would only take minimal step with RLE before advancing LLE. Pt also required multimodal cues for erect posture. Pt appreciative of trialing gait outside of parallel bars. Pt then moved to mat and performed squat pivot to high/low mat from w/c with CGA. Pt then attempted stand pivot transfer to w/c with RW and Sit to stand from elevated mat. Pt stood with CGA and required modA for stand/step pivot. Pt then participated in block practice step pivot transfer to/from w/c progressing to minA however requiring mod/mox multimodal cues for sequencing for safety. Pt transported back to room at end of session and noted that all pt's belongings on bed. Per nsg pt to be transported to [facility]. Pt left in w/c with nsg aware of pt's disposition.       Therapy Documentation Precautions:  Precautions Precautions: Fall, Cervical Required Braces or Orthoses: Cervical Brace Cervical Brace: Soft collar Restrictions Weight Bearing Restrictions: No Other Position/Activity Restrictions: per MD orders, collar can be removed for shower, in bed and getting in the bathroom General:   Vital Signs: Therapy Vitals Temp: 98.1 F (36.7 C) Temp Source: Oral Pulse Rate: (!) 59 Resp: 20 BP: 100/86 Patient Position (if appropriate): Lying Oxygen Therapy SpO2: 100 % O2 Device: Room Air    Therapy/Group: Individual Therapy  Rockelle Heuerman Varetta Chavers, PTA  08/11/2021, 4:10 PM

## 2021-08-11 NOTE — Progress Notes (Signed)
Recreational Therapy Session Note  Patient Details  Name: [name] MRN: [id number] Date of Birth: 01/19/1951 Today's Date: 08/11/2021  Pain: no c/o Skilled Therapeutic Interventions/Progress Updates: Session focused on UE use during tabletop card game.  Pt stood with min-mod assist for left knee control while manipulating cards with RUE.  Pt stated difficulty knowing where LEs are during standing activity.  Pt with poor standing tolerance, needing frequent seated rest breaks.  While seated, pt was able to deal & play cards using BUEs with Mod I seated, needing additional time for task completion.  Pt joking an laughing throughout session, stating he enjoyed the activity and all the therapists efforts in his rehab.  Therapy/Group: Co-Treatment  Nanci Lakatos 08/11/2021, 3:33 PM

## 2021-08-11 NOTE — Progress Notes (Signed)
Occupational Therapy Session Note  Patient Details  Name: [name] MRN: [id number] Date of Birth: [birth year]  Today's Date: 08/11/2021 OT Individual Time: 1410-1520 OT Individual Time Calculation (min): 70 min    Short Term Goals: Week 2:  OT Short Term Goal 1 (Week 2): Pt will be able to sit to stand to RW with mod A of 1 and hold stand for 30 sec in prep for LB self care. OT Short Term Goal 2 (Week 2): Pt will be able to use R hand to wash L arm with min A. OT Short Term Goal 3 (Week 2): Pt will bathing tasks with min A with sit<>stand from W/C or TTB OT Short Term Goal 4 (Week 2): Pt will complete LB dressing tasks with mod A sit<>stand to pull pants over hips  Skilled Therapeutic Interventions/Progress Updates:  Pt greeted supine in bed agreeable to OT/RT co-txt. Session focus on various therapeutic activities focused on RUE [facility] and dynamic standing balance. Pt completed bed mobility with CGA pt completed squat pivot with MOD A to w/c as pt mildly impulsive during transfer. Pt transported to day room with total A. Worked on sit<>stands with rw with pt needing MIN - MOD A +2 for safety as pt with impaired proprioception of BLEs needing assist to stabilize LLE as pt with tendency to lock LLE into extension.  Impaired balance with lateral lean to L when pt reaching with RUE to engage in [facility] card game with a focus on in hand manipulation skills such as shifting, rotation and translation of cards. Pt completed x2 sit<>stands at hi-lo table with Rw and able to engage in ~ 4 mins at a time of dynamic reaching/FMC tasks. Pt able to engage in seated [facility] task at table with pt dealing cards to players with RUE with increased time and effort. Pt transported back to room with total A where pt completed impulsive squat pivot transfer from w/c>EOB with CGA. Pt returned to supine with CGA. Discussed adaption's for pts phone, and looked through accessibility settings with most features offering little  change for pt. Pt able to use talk to text features and is accommodating well using increased time. Pt left supine in bed with bed alarm activated and all needs within reach.       Therapy Documentation Precautions:  Precautions Precautions: Fall, Cervical Required Braces or Orthoses: Cervical Brace Cervical Brace: Soft collar Restrictions Weight Bearing Restrictions: No Other Position/Activity Restrictions: per MD orders, collar can be removed for shower, in bed and getting in the bathroom  Pain: pt reports no pain during session     Therapy/Group: Individual Therapy  Precious Haws 08/11/2021, 3:28 PM

## 2021-08-12 NOTE — Progress Notes (Signed)
PROGRESS NOTE   Subjective/Complaints:  Pt reported did bowel program last night- had 1 result- but good sized- no more diarrhea afterwards.  Needs something to support neck- or neck hurts real bad.  Doing better and slept better.   Happy to see whichever doctor I suggest.    ROS:   Pt denies SOB, abd pain, CP, N/V/C/D, and vision changes   Objective:   No results found. No results for input(s): WBC, HGB, HCT, PLT in the last 72 hours.   No results for input(s): NA, K, CL, CO2, GLUCOSE, BUN, CREATININE, CALCIUM in the last 72 hours.    Intake/Output Summary (Last 24 hours) at 08/12/2021 0924 Last data filed at 08/12/2021 0730 Gross per 24 hour  Intake 480 ml  Output 850 ml  Net -370 ml     Pressure Injury 08/10/21 Buttocks Left;Right Stage 2 -  Partial thickness loss of dermis presenting as a shallow open injury with a red, pink wound bed without slough. progressed from stage 1 to stage 2. (Active)  08/10/21 1658  Location: Buttocks  Location Orientation: Left;Right  Staging: Stage 2 -  Partial thickness loss of dermis presenting as a shallow open injury with a red, pink wound bed without slough.  Wound Description (Comments): progressed from stage 1 to stage 2.  Present on Admission:     Physical Exam: Vital Signs Blood pressure (!) 147/85, pulse 65, temperature 97.7 F (36.5 C), temperature source Oral, resp. rate 18, height 5\' 11"  (1.803 m), weight 103 kg, SpO2 96 %.       General: awake, alert, appropriate, sitting up in bed; soft collar in place; NAD HENT: conjugate gaze; oropharynx moist CV: regular rate; no JVD Pulmonary: CTA B/L; no W/R/R- good air movement GI: soft, NT, ND, (+)BS Psychiatric: appropriate; smiling Neurological: Ox3 RUE- biceps 4+/5, Triceps 4-/5, WE 4+/5, grip 4+/5 FA 2+/5 LUE 5/5! RLE- HF 4/5, KE 4+/5, DF/PF 5-/5 LLE- HF 5-/5, KE 5-/5, and DF?PF 5-/5  Sensation to light  touch decreased C4 downwards- no significant improvement Ext: no clubbing, cyanosis, or edema Psych: pleasant and cooperative  Skin: surgical site clean and dry,   buttock wounds Neurological: Alert and oriented x 3. Normal insight and awareness. Intact Memory. Normal language and speech. Cranial nerve exam unremarkable  Decreased sensation still to S5- so neurogenic bowel- also has some improved sensation in palms compared to last week- LUE up to 5-/5 and RUE- 4- to 4/5- improved--struggles with grip in right hand RLE- HF 3-/5, KE/DF and PF 4/5 LLE- 4+/5 in same muscles  Decreased to light touch in pinprick from C4 to S5-   worse in L1-S1 on L   Assessment/Plan: 1. Functional deficits which require 3+ hours per day of interdisciplinary therapy in a comprehensive inpatient rehab setting. Physiatrist is providing close team supervision and 24 hour management of active medical problems listed below. Physiatrist and rehab team continue to assess barriers to discharge/monitor patient progress toward functional and medical goals  Care Tool:  Bathing    Body parts bathed by patient: Right arm, Left arm, Chest, Abdomen, Front perineal area, Right upper leg, Left upper leg, Right lower leg, Face  Body parts bathed by helper: Buttocks, Left lower leg, Right lower leg Body parts n/a: Left arm, Buttocks, Right lower leg, Left lower leg   Bathing assist Assist Level: Minimal Assistance - Patient > 75%     Upper Body Dressing/Undressing Upper body dressing   What is the patient wearing?: Pull over shirt    Upper body assist Assist Level: Supervision/Verbal cueing    Lower Body Dressing/Undressing Lower body dressing      What is the patient wearing?: Pants     Lower body assist Assist for lower body dressing: Minimal Assistance - Patient > 75%     Toileting Toileting    Toileting assist Assist for toileting: Total Assistance - Patient < 25%     Transfers Chair/bed  transfer  Transfers assist     Chair/bed transfer assist level: Contact Guard/Touching assist     Locomotion Ambulation   Ambulation assist      Assist level: Moderate Assistance - Patient 50 - 74% Assistive device: Ethelene Hal Max distance: 87ft   Walk 10 feet activity   Assist  Walk 10 feet activity did not occur: Safety/medical concerns  Assist level: 2 helpers Assistive device: Parallel bars   Walk 50 feet activity   Assist Walk 50 feet with 2 turns activity did not occur: Safety/medical concerns         Walk 150 feet activity   Assist Walk 150 feet activity did not occur: Safety/medical concerns         Walk 10 feet on uneven surface  activity   Assist Walk 10 feet on uneven surfaces activity did not occur: Safety/medical concerns         Wheelchair     Assist Is the patient using a wheelchair?: Yes Type of Wheelchair: Manual    Wheelchair assist level: Supervision/Verbal cueing Max wheelchair distance: 20'    Wheelchair 50 feet with 2 turns activity    Assist        Assist Level: Minimal Assistance - Patient > 75%   Wheelchair 150 feet activity     Assist      Assist Level: Moderate Assistance - Patient 50 - 74%   Blood pressure (!) 147/85, pulse 65, temperature 97.7 F (36.5 C), temperature source Oral, resp. rate 18, height 5\' 11"  (1.803 m), weight 103 kg, SpO2 96 %.  Medical Problem List and Plan: 1.  Cervical myelopathy secondary to epidural hematoma status post epidural hematoma 07/22/2021 after ACDF 07/19/2021.  Cervical collar as directed             -patient may  shower if dressing covered             -ELOS/Goals: 10-12 days min A to supervision  Con't PT and OT-CIR- making gains in strength exam, but sensation still biggest issue 2.  Antithrombotics: -DVT/anticoagulation:  Mechanical: Sequential compression devices, below knee Bilateral lower extremities.  Check vascular study  10/5- since had epidural  hematoma, don't want to start Lovenox without d/w NSU- Doppler s (-)             -antiplatelet therapy: N/A 3. Pain Management: Robaxin/oxycodone as needed -will add Gabapentin 300 mg BID for now.   10/3- stinging/burning pain better- will maintain dose for now.   10/8- pain under reasonable control  10/10- will change Robaxin to skelaxin 800 mg QID prn since not sedating and see if can send home on it- possibly- if not, will send home on Robaxin.   10/11- skelaxin worked well-  con't regimen  10/13- pain controlled -con't regimen 4. Mood: Celexa 20 mg daily             -antipsychotic agents: N/A 5. Neuropsych: This patient is capable of making decisions on his own behalf. 6. Skin/Wound Care: Routine skin checks. Foam dressings to buttocks 7. Fluids/Electrolytes/Nutrition: Routine in and outs with follow-up chemistries 8.  Hypertension.  Lopressor 50 mg twice daily, lisinopril 10 mg daily, Demadex 20 mg twice daily.  Monitor with increased mobility  10/10- will reduce Lisinopril to 5 mg daily since Cr is up- it's also likely the Demadex, however need to see exactly if can reduce dose.   10/11- reducing Demadex to 20 mg daily- to see if also helps kidney function- will recheck Thursday  10/13- will check BMP in AM 9.  BPH with urinary retention,likely neurogenic bladder.  Flomax 0.8 mg daily.  Keep foley for 1-2 days, then try to remove and check PVRs.   10/4- pt insistent wants to keep foley longer due to 1 handedness with urinal- will d/c Friday-don't feel comfortable keeping longer. On max dose Flomax- hx of SPC.   10/9- Still no sense of bladder fullness. I/O cath volumes inconsistent. Continue q6 schedule. May have to adjust timing with meds and fluid intake  10/10- will change I/o caths to q4 hours since volumes so high esp since had cath volume of 1350cc last night.   10/11- caths q4 hours  10/12- cath volumes doing better- con't q4 hours- will have pt f/u with Dr Claudia Desanctis.  10.   Hyperlipidemia.  Crestor 11.  Hypothyroidism.  Synthroid 12.  CAD with CABG 2019.  Aspirin currently on hold 13.  Constipation/neurogenic bowel. Will change to Senokot and give Sorbitol 60cc x1 to get him to go.   10/4- LBM yesterday- doing well- con't regimen  10/5- will start Bowel program this evening- went over what it entails and how it works and takes 3-6 weeks to get into stable regimen. 10/10- having incontinent stools 10/12- doing bowel program- now having diarrhea- stopped senokot for now and will monitor; On Fibercon  10/13- will restart Senna Friday AM only 1 tab daily.  14.  GERD.  Protonix  15. Azotemia  10/3- Cr 1.20 which is baseline/slightly better; but BUN p to 34- up from 20s- will push fluids and recheck Thursday. 10/6- BUN 25 down from 34 and Cr 1.15- so also improved- will check weekly.  10/10- Cr up to 1.32 and BUN 28- likely due ot demadex 10/11- reduced Demadex to 20 mg daily and will recheck labs Thursday.  10/13- labs in AM 16. Dispo 10/7- will allow therapeutic pass.    10/13- will ask about covid booster  LOS: 11 days A FACE TO FACE EVALUATION WAS PERFORMED  Pauline Trainer 08/12/2021, 9:24 AM

## 2021-08-12 NOTE — Progress Notes (Addendum)
Physical Therapy Session Note  Patient Details  Name: ANEES VANECEK MRN: [id number] Date of Birth: December 25, 1950  Today's Date: 08/12/2021 PT Individual Time: 1000-1100 PT Individual Time Calculation (min): 60 min   Short Term Goals: Week 1:  PT Short Term Goal 1 (Week 1): Pt will perform bed mobility consistently with minA. PT Short Term Goal 2 (Week 1): Pt wil perform sit to stand transfer consistently with minA. PT Short Term Goal 2 - Progress (Week 1): Met PT Short Term Goal 3 (Week 1): Pt will performs bed to chair transfer consistently with minA. PT Short Term Goal 3 - Progress (Week 1): Met PT Short Term Goal 4 (Week 1): Pt will ambulate x25' with modA +1 and LRAD. PT Short Term Goal 4 - Progress (Week 1): Progressing toward goal Week 2:  PT Short Term Goal 1 (Week 2): Pt will ambulate x 25 ft with LRAD and mod A PT Short Term Goal 2 (Week 2): Pt will perform least restrictive transfers with CGA consistently PT Short Term Goal 3 (Week 2): Pt will perform w/c mobility x 100 ft with Supervision Week 3:     Skilled Therapeutic Interventions/Progress Updates:     Supine to sit w/use of rail, cga, cues for safety Dons shorts in sitting w/min assist for threading feet. Sit to stand from bed to RW w/mod assist, raises pants w/mod assist, cga for balance w/RW. Squat pivot bed to wc w/cga, cues for safety, pt impulsive. Pt transported to gym  In parallel bars for postural strenghening/stability and coordination challenge: Static stand w/bilat UE support Standing w/alternating forward reach, single UE support to no UE support w/min assist. Standing w/single leg step to/from 2 targets   Gait in parallel bars: 67f forward/back using bilat UE support on bars, pt w/increased step length R vs L w/step near to gait 'staggar stepping quality".  Hyperextension of L knee, retraction of L hip, hiking of L hip (ridid/postural) Rpeated gait x 4 w/seated rest between efforts.  Squat pivot wc to  bed w/min assist.  Sit to supine w/mod assist for LEs.  Pt left supine w/rails up x 3, alarm set, bed in lowest position, and needs in reach.   Therapy Documentation Precautions:  Precautions Precautions: Fall, Cervical Required Braces or Orthoses: Cervical Brace Cervical Brace: Soft collar Restrictions Weight Bearing Restrictions: No Other Position/Activity Restrictions: per MD orders, collar can be removed for shower, in bed and getting in the bathroom     Therapy/Group: Individual Therapy BCallie Fielding PTelford10/13/2022, 4:42 PM

## 2021-08-12 NOTE — Progress Notes (Signed)
Recreational Therapy Session Note  Patient Details  Name: [name] MRN: [id number] Date of Birth: 1951-07-22 Today's Date: 08/12/2021  Pain: no c/o Skilled Therapeutic Interventions/Progress Updates: Pt excited for therapy session.  Pt remains highly motivated and willing to participate in sessions even when fatigued. Session focused on ambulation using Lite Gait with +2 assist.  Therapeutic use of music to aid in mobility and mood.   Leisure discussion during seated rest breaks.    [place] 08/12/2021, 9:36 AM

## 2021-08-12 NOTE — Progress Notes (Signed)
Physical Therapy Session Note  Patient Details  Name: [name] MRN: [id number] Date of Birth: [birth year]  Today's Date: 08/12/2021 PT Individual Time: 1305-1405 PT Individual Time Calculation (min): 60 min   Short Term Goals: Week 2:  PT Short Term Goal 1 (Week 2): Pt will ambulate x 25 ft with LRAD and mod A PT Short Term Goal 2 (Week 2): Pt will perform least restrictive transfers with CGA consistently PT Short Term Goal 3 (Week 2): Pt will perform w/c mobility x 100 ft with Supervision  Skilled Therapeutic Interventions/Progress Updates: Pt presented in bed sleeping but easily aroused and agreeable to therapy. Lattie Haw, RT arrived and present through entire session. Pt performed supine to sit with supervision and use of bed features. Performed stand pivot to w/c with modA and RW. Pt advised that wife brought in shoes and PTA donned total A. Pt transported to day room and set up with Lite Gait. PTA explained to pt use/purpose of Lite Gait. Pt Participated in x3 bouts of ambulation. First bout pt unable to coordinate movement and advance LLE. Pt was able to take step with RLE. Pt also noted to have significantly forward flexed posture and increased lean to L. On second attempt PTA was behind pt to facilitate weight shifting and provided tactile cues on which foot to advance. Pt was able to ambulate ~54ft with Lite gait before fatigue. Pt provided with extended rest prior to third attempt. Pt was able to ambulate ~61ft with PTA facilitating weight shifting and providing tactile cues for quad activation for TKE. Once completed pt transported back to room and performed stand pivot to w/c with modA and verbal cues for sequencing. Pt was able to perform sit to supine on flat bed with supervision and use of bed rail. Pt left in bed with nsg present to perform in/out cath.      Therapy Documentation Precautions:  Precautions Precautions: Fall, Cervical Required Braces or Orthoses: Cervical  Brace Cervical Brace: Soft collar Restrictions Weight Bearing Restrictions: No Other Position/Activity Restrictions: per MD orders, collar can be removed for shower, in bed and getting in the bathroom General:   Vital Signs:  Pain: Pain Assessment Pain Scale: 0-10 Pain Score: 5  Pain Type: Acute pain Pain Location: Neck Pain Orientation: Posterior Pain Descriptors / Indicators: Aching;Cramping;Throbbing Pain Frequency: Intermittent Pain Onset: On-going Patients Stated Pain Goal: 2 Pain Intervention(s): Medication (See eMAR) Mobility:   Locomotion :    Trunk/Postural Assessment :    Balance:   Exercises:   Other Treatments:      Therapy/Group: Individual Therapy  Tesia Lybrand 08/12/2021, 4:03 PM

## 2021-08-12 NOTE — Progress Notes (Signed)
Bowel program initiated at 1843 pm dig Stim performed and bisacodyl suppository inserted. Dig Stim completed 15 seconds x 2 small hard BM expressed bowel program complete

## 2021-08-12 NOTE — Progress Notes (Signed)
Occupational Therapy Session Note  Patient Details  Name: [name] MRN: [id number] Date of Birth: 11-12-1950  Today's Date: 08/12/2021 OT Individual Time: 0148-4039 OT Individual Time Calculation (min): 72 min    Short Term Goals: Week 1:  OT Short Term Goal 1 (Week 1): Pt will be able to sit to stand to RW with mod A of 1 and hold stand for 30 sec in prep for LB self care. OT Short Term Goal 1 - Progress (Week 1): Progressing toward goal OT Short Term Goal 2 (Week 1): Pt will demonstrate improved UE strength to don tshirt with min A OT Short Term Goal 2 - Progress (Week 1): Met OT Short Term Goal 3 (Week 1): Pt will be able to don shorts over feet with mod A. OT Short Term Goal 3 - Progress (Week 1): Met OT Short Term Goal 4 (Week 1): Pt will be able to use R hand to wash L arm with min A. OT Short Term Goal 4 - Progress (Week 1): Progressing toward goal Week 2:  OT Short Term Goal 1 (Week 2): Pt will be able to sit to stand to RW with mod A of 1 and hold stand for 30 sec in prep for LB self care. OT Short Term Goal 2 (Week 2): Pt will be able to use R hand to wash L arm with min A. OT Short Term Goal 3 (Week 2): Pt will bathing tasks with min A with sit<>stand from W/C or TTB OT Short Term Goal 4 (Week 2): Pt will complete LB dressing tasks with mod A sit<>stand to pull pants over hips   Skilled Therapeutic Interventions/Progress Updates:    Pt greeted at time of session semireclined in bed sleeping, easily woken and agreeable to OT session. Aware that plan is to make splint for R hand to prevent deformity. Supine > sit Supervision and squat pivot bed <> wheelchair CGA. Transported to ortho gym for fitting and application of anti claw splint for R hand. Note pt actively participated in group session as well regarding stress management and coping that happened to be running as well at this time. Splint fabricated along palmar surface along palmar crease and additional strap along  digits 4 & 5 to prevent MCP hyperextension. Pt educated on how to don/doff as well including strap placement. Aware to look for redness/irritation and alert therapist for modifications as needed. Back in room, squat pivot back to bed CGA. Alarm on call bell in reach.   Therapy Documentation Precautions:  Precautions Precautions: Fall, Cervical Required Braces or Orthoses: Cervical Brace Cervical Brace: Soft collar Restrictions Weight Bearing Restrictions: No Other Position/Activity Restrictions: per MD orders, collar can be removed for shower, in bed and getting in the bathroom      Therapy/Group: Individual Therapy  Viona Gilmore 08/12/2021, 7:27 AM

## 2021-08-13 LAB — BASIC METABOLIC PANEL
Anion gap: 7 (ref 5–15)
BUN: 20 mg/dL (ref 8–23)
CO2: 28 mmol/L (ref 22–32)
Calcium: 8.9 mg/dL (ref 8.9–10.3)
Chloride: 99 mmol/L (ref 98–111)
Creatinine, Ser: 1.27 mg/dL — ABNORMAL HIGH (ref 0.61–1.24)
GFR, Estimated: >60 mL/min (ref >60)
Glucose, Bld: 96 mg/dL (ref 70–99)
Potassium: 4.2 mmol/L (ref 3.5–5.1)
Sodium: 134 mmol/L — ABNORMAL LOW (ref 135–145)

## 2021-08-13 MED ORDER — TRAZODONE HCL 50 MG PO TABS
50.0000 mg | ORAL_TABLET | Freq: Every day | ORAL | Status: DC
  Administered 2021-08-13 – 2021-08-23 (×11): 50 mg via ORAL
  Filled 2021-08-13 (×11): qty 1

## 2021-08-13 MED ORDER — TETANUS-DIPHTHERIA TOXOIDS TD 5-2 LFU IM INJ
0.5000 mL | INJECTION | Freq: Once | INTRAMUSCULAR | Status: AC
  Administered 2021-08-13: 0.5 mL via INTRAMUSCULAR
  Filled 2021-08-13: qty 0.5

## 2021-08-13 NOTE — Consult Note (Signed)
Neuropsychological Consultation   Patient:   [name]   DOB:   [birth year]  MR Number:  [id number]  Location:  [facility] [id number] [place] [id number] Dept: [place]: [phone]           Date of Service:   08/13/2021  Start Time:   10 AM End Time:   11 AM  Provider/Observer:  Ilean Skill, Psy.D.       Clinical Neuropsychologist       Billing Code/Service: [phone]  Chief Complaint:    [name] is a 70 year old male referred for neuropsychological consult due to ongoing issues with residual effects after developing epidural hematoma at C3-4 with cord compression post surgery and incomplete quadriplegia postsurgical interventions.  Patient has a past medical history including ITP, hypertension, CAD with CABG 2019, hyperlipidemia.  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  Patient was discharged home 07/20/2021 ambulating 400 feet with supervision without assistive device.  Presented again on 07/22/2021 with numbness, tingling, sensation changes and weakness going down his arms and legs while taking a shower.  Work-up and imaging showed epidural hematoma at C3-4 with cord compression.  Patient underwent revision at C3-4 for evacuation of epidural hematoma and placement of intervertebral biomechanical device C3-4.  Therapy assessments were done and patient was admitted to CIR due to decreased functional mobility.  Reason for Service:  Patient was referred for neuropsychological consultation due to coping and adjustment issues with improving but continued significant motor deficits particularly for his right hand and arm and right leg.  Below is the HPI for the current admission.  HPI: [name] is a 70 year old right-handed male with history significant for ITP, hypertension, CAD with CABG  2019, hyperlipidemia.  Per chart review patient lives with spouse.  1 level home 3 steps to entry.  Independent with assistive device.  Patient with recent admission for C3/4 ACDF by 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  He was discharged home 07/20/2021 ambulating 400 feet supervision without assistive device.  Presented 07/22/2021 with numbness tingling, lightening sensation and weakness going down his arms and legs while taking a shower.  Work-up and imaging showed epidural hematoma at C3-4 with cord compression.  Underwent revision arthrodesis C3-4 anterior interbody technique evacuation of epidural hematoma placement of intervertebral biomechanical device C3-4 07/22/2021 per [name].  Patient continues with cervical brace.  Tolerating a regular consistency diet.  Therapy evaluations completed due to patient decreased functional mobility was admitted for a comprehensive rehab program.  Current Status:  Patient was in his wheelchair sitting up at the rolling table awake and alert with good mental status.  Affect was appropriate and euthymic and he was quite talkative throughout visit.  The patient displayed what appeared to be baseline cognitive functioning levels with good expressive and receptive language.  The patient reports that he has remained highly motivated towards improvement and that he was working on developing adjustment and coping strategies particularly for transfers and taking care of basic day-to-day needs.  While patient is looking forward to discharge from the unit home he reports that he knows that he is in the best place that he could be right now.  Patient reports that he was initially quite discouraged and worried about significant neurological changes but he has seen progressive improvements.  Behavioral Observation: Oscar Ledet  Burns  presents as a 70 y.o.-year-old Right handed Caucasian Male who appeared his stated age. his dress was  Appropriate and he was Well Groomed and his manners were Appropriate to the situation.  his participation was indicative of Appropriate and Attentive behaviors.  There were physical disabilities noted.  he displayed an appropriate level of cooperation and motivation.     Interactions:    Active Appropriate and Redirectable  Attention:   abnormal and attention span appeared shorter than expected for age  Memory:   within normal limits; recent and remote memory intact  Visuo-spatial:  not examined  Speech (Volume):  normal  Speech:   normal; normal  Thought Process:  Coherent and Relevant  Though Content:  WNL; not suicidal and not homicidal  Orientation:   person, place, time/date, and situation  Judgment:   Good  Planning:   Fair  Affect:    Appropriate  Mood:    Euthymic  Insight:   Good  Intelligence:   normal    Medical History:   Past Medical History:  Diagnosis Date   Allergy    Carpal tunnel syndrome    Chronic back pain    Chronic combined systolic and diastolic congestive heart failure (HCC)    Chronic ITP (idiopathic thrombocytopenia) ([place]) 05/25/2015   Coronary artery disease    Coronary artery disease involving native coronary artery of native heart with angina pectoris ([place])    Degenerative disc disease    GERD (gastroesophageal reflux disease)    History of thrombocytopenia    Hypercholesteremia    Hypertension    Hypothyroid    Lumbar pain    Myocardial infarction ([place]) 2009   Obesity    Pneumonia    around age 56   S/P CABG x 3 04/27/2018   LIMA to LAD SVG to OM1 SVG to OM2   Shortness of breath dyspnea          Patient Active Problem List   Diagnosis Date Noted   Pressure injury of skin 08/08/2021   Acute incomplete quadriplegia ([place]) 08/01/2021   Epidural hematoma 07/22/2021   Myelopathy ([place]) 07/19/2021   Callus 05/18/2021   Acute bacterial rhinosinusitis 10/05/2020   Cough in adult 10/05/2020   S/P CABG x 3 04/27/2018   Angina  pectoris ([place]) 04/24/2018   Obesity    Chronic combined systolic and diastolic congestive heart failure ([place])    Peripheral edema 06/30/2017   Pedal edema 11/28/2016   Osteopenia 08/13/2015   Thrombocytopenia ([place]) 05/25/2015   Chronic ITP (idiopathic thrombocytopenia) ([place]) 05/25/2015   Hypothyroidism 08/29/2014   Diverticulitis of colon (without mention of hemorrhage)(562.11) 02/04/2014   Dysphagia, unspecified(787.20) 02/04/2014   Hyperlipidemia 11/18/2013   Dyspnea 01/02/2012   Palpitations 01/02/2012   Mixed hyperlipidemia 07/06/2011   Coronary artery disease involving native coronary artery of native heart with angina pectoris ([place])    TOTAL KNEE FOLLOW-UP 08/14/2008   Chronic back pain 07/23/2008   KNEE, ARTHRITIS, DEGEN./OSTEO 05/29/2008   JOINT EFFUSION, KNEE 04/30/2008   Pain in joint, lower leg 10/10/2007   Essential hypertension 10/09/2007     Psychiatric History:  No prior psychiatric history noted  Family Med/Psych History:  Family History  Problem Relation Age of Onset   Heart attack Father    Heart disease Father    Colon cancer Maternal Grandmother    Esophageal cancer Neg Hx    Rectal cancer Neg Hx    Stomach cancer Neg Hx     Impression/DX:  Oscar Bunn  Burns is a 70 year old male referred for neuropsychological consult due to ongoing issues with residual effects after developing epidural hematoma at C3-4 with cord compression post surgery and incomplete quadriplegia postsurgical interventions.  Patient has a past medical history including ITP, hypertension, CAD with CABG 2019, hyperlipidemia.  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  Patient was discharged home 07/20/2021 ambulating 400 feet with supervision without assistive device.  Presented again on 07/22/2021 with numbness, tingling, sensation changes and weakness going down his arms and legs while taking a shower.  Work-up  and imaging showed epidural hematoma at C3-4 with cord compression.  Patient underwent revision at C3-4 for evacuation of epidural hematoma and placement of intervertebral biomechanical device C3-4.  Therapy assessments were done and patient was admitted to CIR due to decreased functional mobility.   Current Status:  Patient was in his wheelchair sitting up at the rolling table awake and alert with good mental status.  Affect was appropriate and euthymic and he was quite talkative throughout visit.  The patient displayed what appeared to be baseline cognitive functioning levels with good expressive and receptive language.  The patient reports that he has remained highly motivated towards improvement and that he was working on developing adjustment and coping strategies particularly for transfers and taking care of basic day-to-day needs.  While patient is looking forward to discharge from the unit home he reports that he knows that he is in the best place that he could be right now.  Patient reports that he was initially quite discouraged and worried about significant neurological changes but he has seen progressive improvements.  Disposition/Plan:  Today we worked on coping and adjustment issues particularly around loss of motor functions and changes in somatosensory functioning.  Patient remains motivated and actively working and will be discharged home soon  Diagnosis:    Incomplete quadriplegia         Electronically Signed   _______________________ Ilean Skill, Psy.D. Clinical Neuropsychologist

## 2021-08-13 NOTE — Progress Notes (Signed)
Occupational Therapy Session Note  Patient Details  Name: [name] MRN: [id number] Date of Birth: 07-Oct-1951  Today's Date: 08/13/2021 OT Individual Time: 6734-1937 OT Individual Time Calculation (min): 54 min    Short Term Goals: Week 2:  OT Short Term Goal 1 (Week 2): Pt will be able to sit to stand to RW with mod A of 1 and hold stand for 30 sec in prep for LB self care. OT Short Term Goal 2 (Week 2): Pt will be able to use R hand to wash L arm with min A. OT Short Term Goal 3 (Week 2): Pt will bathing tasks with min A with sit<>stand from W/C or TTB OT Short Term Goal 4 (Week 2): Pt will complete LB dressing tasks with mod A sit<>stand to pull pants over hips  Skilled Therapeutic Interventions/Progress Updates:  Patient met lying supine in bed in agreement with OT treatment session. 0/10 pain reported at rest and with activity. Patient with desire to bathe at shower level. Total A for wc transport to tub room on 10M given nature and confines of hospital bathroom. Patient able to bathe UB with supervision A and LB with Min A in sitting with lateral leans R<>L. UB bathing seated on tub bench with set-up assist and LB dressing with Min A to hike underwear/pants over hips in standing. Total A for return to hospital room on [facility]. Session concluded with patient seated in wc with call bell within reach, belt alarm activated and all needs met.   Therapy Documentation Precautions:  Precautions Precautions: Fall, Cervical Required Braces or Orthoses: Cervical Brace Cervical Brace: Soft collar Restrictions Weight Bearing Restrictions: No Other Position/Activity Restrictions: per MD orders, collar can be removed for shower, in bed and getting in the bathroom General:   Therapy/Group: Individual Therapy  Haruo Stepanek R [place] 08/13/2021, 6:58 AM

## 2021-08-13 NOTE — Progress Notes (Signed)
Occupational Therapy Session Note  Patient Details  Name: [name] MRN: [id number] Date of Birth: 1951/05/02  Today's Date: 08/13/2021 OT Group Time: 1500-1600 OT Group Time Calculation (min): 60 min   Short Term Goals: Week 1:  OT Short Term Goal 1 (Week 1): Pt will be able to sit to stand to RW with mod A of 1 and hold stand for 30 sec in prep for LB self care. OT Short Term Goal 1 - Progress (Week 1): Progressing toward goal OT Short Term Goal 2 (Week 1): Pt will demonstrate improved UE strength to don tshirt with min A OT Short Term Goal 2 - Progress (Week 1): Met OT Short Term Goal 3 (Week 1): Pt will be able to don shorts over feet with mod A. OT Short Term Goal 3 - Progress (Week 1): Met OT Short Term Goal 4 (Week 1): Pt will be able to use R hand to wash L arm with min A. OT Short Term Goal 4 - Progress (Week 1): Progressing toward goal  Skilled Therapeutic Interventions/Progress Updates:    Pt participated in rhythmic drumming group. No pain reported. Focus of group on BUE coordination, strengthening, endurance, timing/control, activity tolerance, and social participation and engagement. Pt performs session from seated position for energy conservation. Skilled interventions included wrapping built-up drumstick with [place] to help compensate for decreased coordination R hand (skin check normal pre-and post application) and offering strategies for activity tolerance. Warm up performed prior to exercises and UB stretching completed at end of group with demo from OT. Pt able to select preferred song to share with group. Returned pt to room at end of session. Pt reported that he really enjoyed the group. Inquired about grounds pass - pt aware he needs to ask MD. Exited session with pt seated in bed, exit alarm on and call light in reach.   Therapy Documentation Precautions:  Precautions Precautions: Fall, Cervical Required Braces or Orthoses: Cervical Brace Cervical Brace: Soft  collar Restrictions Weight Bearing Restrictions: No Other Position/Activity Restrictions: per MD orders, collar can be removed for shower, in bed and getting in the bathroom  Therapy/Group: Group Therapy  Milt Coye 08/13/2021, 6:45 AM

## 2021-08-13 NOTE — Progress Notes (Signed)
Occupational Therapy Session Note  Patient Details  Name: [name] MRN: [id number] Date of Birth: 1951-09-07  Today's Date: 08/13/2021 OT Individual Time: 1430-1500 OT Individual Time Calculation (min): 30 min    Short Term Goals: Week 2:  OT Short Term Goal 1 (Week 2): Pt will be able to sit to stand to RW with mod A of 1 and hold stand for 30 sec in prep for LB self care. OT Short Term Goal 2 (Week 2): Pt will be able to use R hand to wash L arm with min A. OT Short Term Goal 3 (Week 2): Pt will bathing tasks with min A with sit<>stand from W/C or TTB OT Short Term Goal 4 (Week 2): Pt will complete LB dressing tasks with mod A sit<>stand to pull pants over hips  Skilled Therapeutic Interventions/Progress Updates:    Pt greeted semi-reclined in bed and agreeable to OT treatment session. Pt completed bed mobility w/ supervision and then performed squat-pivot to wc with CGA.Pt brought down to therapy gym and e-stim placed on thenar eminence. Focus on functional pinch with R hand on medium sized pegs. Graded activity by using pegs with coband to improve gasp. Pt brought to dayroom and dropped off for drum group.   Therapy Documentation Precautions:  Precautions Precautions: Fall, Cervical Required Braces or Orthoses: Cervical Brace Cervical Brace: Soft collar Restrictions Weight Bearing Restrictions: No Other Position/Activity Restrictions: per MD orders, collar can be removed for shower, in bed and getting in the bathroom Pain: Pain Assessment Pain Scale: 0-10 Pain Score: 5  Pain Type: Acute pain Pain Location: Neck Pain Orientation: Posterior Pain Descriptors / Indicators: Aching;Cramping Pain Frequency: Intermittent Pain Onset: On-going Patients Stated Pain Goal: 2 Pain Intervention(s): Repositioned    Therapy/Group: Individual Therapy  Valma Cava 08/13/2021, 3:08 PM

## 2021-08-13 NOTE — Progress Notes (Signed)
Physical Therapy Session Note  Patient Details  Name: [name] MRN: [id number] Date of Birth: [birth year]  Today's Date: 08/13/2021 PT Individual Time: 1055-1205 PT Individual Time Calculation (min): 70 min   Short Term Goals: Week 2:  PT Short Term Goal 1 (Week 2): Pt will ambulate x 25 ft with LRAD and mod A PT Short Term Goal 2 (Week 2): Pt will perform least restrictive transfers with CGA consistently PT Short Term Goal 3 (Week 2): Pt will perform w/c mobility x 100 ft with Supervision  Skilled Therapeutic Interventions/Progress Updates: Pt presented in w/c agreeable to therapy. Pt c/o mild pain in neck and shoulders but did not rate and no intervention requested during session. Pt transported to rehab gym and performed stand pivot transfer to high/low mat. Pt set up in maxi-sky and 5lb weights placed on BLE. Pt participated in [place] via forced use for BLE with mirror feedback. In standing pt performed weight shifting activities including marching, toe taps to cones, and toe taps to target. Pt was able to complete 2 x 10 of each with emphasis placed in foot placement and improving erect posture. Pt also participated in kicking ball to observer trying to maintain neutral stand when weight shifting as well as maintain space between bilateral feet when returning foot to ground. Pt did require increased time between bouts due to fatigue. Pt then ambulated ~69ft with RW and Maxi Sky with mirror feedback and wood plank between feet to improve BOS. Pt noted to have improved weight shifting, and step length this session as compared to previous day's gait trial. Pt then participated in w/c mobility ~65ft with increased pain and noted difficulty gripping w/c rim. PTA placed theraband on rim and pt noted to have improved grip and control. Discussed with pt obtaining workout gloves to help with grip for w/c mobility. Pt transported back to room and remained in w/c as wife present with lunch. Pt left in w/c  with seat alarm on, call bell within reach and family present.      Therapy Documentation Precautions:  Precautions Precautions: Fall, Cervical Required Braces or Orthoses: Cervical Brace Cervical Brace: Soft collar Restrictions Weight Bearing Restrictions: No Other Position/Activity Restrictions: per MD orders, collar can be removed for shower, in bed and getting in the bathroom General:   Vital Signs:  Pain: Pain Assessment Pain Scale: 0-10 Pain Score: 5  Pain Type: Acute pain Pain Location: Neck Pain Orientation: Posterior Pain Descriptors / Indicators: Aching;Cramping Pain Frequency: Intermittent Pain Onset: On-going Patients Stated Pain Goal: 2 Pain Intervention(s): Medication (See eMAR) Multiple Pain Sites: No Mobility:   Locomotion :    Trunk/Postural Assessment :    Balance:   Exercises:   Other Treatments:      Therapy/Group: Individual Therapy  Caily Rakers 08/13/2021, 4:39 PM

## 2021-08-13 NOTE — Progress Notes (Signed)
Bowel program started with small type 1 bowel movement expressed with dig Stim performed bisacodyl suppository inserted will notify oncoming nurse of incomplete bowel program

## 2021-08-13 NOTE — Progress Notes (Signed)
PROGRESS NOTE   Subjective/Complaints:  Pt reports NO result with bowel program last night and small this evening.  Sore from walking a few steps in lite gait. First time wore shoes in 1 month.  Trouble sleeping- asked for something to sleep.  Also already had flu shot and Had COVID/omicron in August 2022.   Wants/needs tetanus shot- been >10 years- ordered.    ROS:   Pt denies SOB, abd pain, CP, N/V/(+) C/D, and vision changes   Objective:   No results found. No results for input(s): WBC, HGB, HCT, PLT in the last 72 hours.   Recent Labs    08/13/21 0507  NA 134*  K 4.2  CL 99  CO2 28  GLUCOSE 96  BUN 20  CREATININE 1.27*  CALCIUM 8.9      Intake/Output Summary (Last 24 hours) at 08/13/2021 1908 Last data filed at 08/13/2021 1819 Gross per 24 hour  Intake 716 ml  Output 600 ml  Net 116 ml     Pressure Injury 08/10/21 Buttocks Left;Right Stage 2 -  Partial thickness loss of dermis presenting as a shallow open injury with a red, pink wound bed without slough. progressed from stage 1 to stage 2. (Active)  08/10/21 1658  Location: Buttocks  Location Orientation: Left;Right  Staging: Stage 2 -  Partial thickness loss of dermis presenting as a shallow open injury with a red, pink wound bed without slough.  Wound Description (Comments): progressed from stage 1 to stage 2.  Present on Admission:     Physical Exam: Vital Signs Blood pressure 128/61, pulse 60, temperature 97.6 F (36.4 C), temperature source Oral, resp. rate 14, height 5\' 11"  (1.803 m), weight 103.1 kg, SpO2 99 %.        General: awake, alert, appropriate, laying in bed with soft collar in place; NAD HENT: conjugate gaze; oropharynx moist; incision looks great CV: regular rate; no JVD Pulmonary: CTA B/L; no W/R/R- good air movement GI: soft, NT, ND, (+)BS- slightly hypoactive Psychiatric: appropriate; smiling Neurological:  Ox3 RUE- biceps 4+/5, Triceps 4-/5, WE 4+/5, grip 4+/5 FA 2+/5 LUE 5/5! RLE- HF 4/5, KE 4+/5, DF/PF 5-/5 LLE- HF 5-/5, KE 5-/5, and DF?PF 5-/5  Sensation to light touch decreased C4 downwards- no significant improvement Ext: no clubbing, cyanosis, or edema Skin: surgical site clean and dry,   buttock wounds Neurological: Alert and oriented x 3. Normal insight and awareness. Intact Memory. Normal language and speech. Cranial nerve exam unremarkable  Decreased sensation still to S5- so neurogenic bowel- also has some improved sensation in palms compared to last week- LUE up to 5-/5 and RUE- 4- to 4/5- improved--struggles with grip in right hand RLE- HF 3-/5, KE/DF and PF 4/5 LLE- 4+/5 in same muscles  Decreased to light touch in pinprick from C4 to S5-   worse in L1-S1 on L   Assessment/Plan: 1. Functional deficits which require 3+ hours per day of interdisciplinary therapy in a comprehensive inpatient rehab setting. Physiatrist is providing close team supervision and 24 hour management of active medical problems listed below. Physiatrist and rehab team continue to assess barriers to discharge/monitor patient progress toward functional and medical goals  Care Tool:  Bathing    Body parts bathed by patient: Right arm, Left arm, Chest, Abdomen, Front perineal area, Right upper leg, Left upper leg, Right lower leg, Face, Buttocks   Body parts bathed by helper: Left lower leg, Right lower leg Body parts n/a: Left arm, Buttocks, Right lower leg, Left lower leg   Bathing assist Assist Level: Minimal Assistance - Patient > 75%     Upper Body Dressing/Undressing Upper body dressing   What is the patient wearing?: Pull over shirt    Upper body assist Assist Level: Supervision/Verbal cueing    Lower Body Dressing/Undressing Lower body dressing      What is the patient wearing?: Pants, Underwear/pull up     Lower body assist Assist for lower body dressing: Minimal Assistance -  Patient > 75%     Toileting Toileting    Toileting assist Assist for toileting: Total Assistance - Patient < 25%     Transfers Chair/bed transfer  Transfers assist     Chair/bed transfer assist level: Contact Guard/Touching assist     Locomotion Ambulation   Ambulation assist      Assist level: Moderate Assistance - Patient 50 - 74% Assistive device: Ethelene Hal Max distance: 74ft   Walk 10 feet activity   Assist  Walk 10 feet activity did not occur: Safety/medical concerns  Assist level: 2 helpers Assistive device: Parallel bars   Walk 50 feet activity   Assist Walk 50 feet with 2 turns activity did not occur: Safety/medical concerns         Walk 150 feet activity   Assist Walk 150 feet activity did not occur: Safety/medical concerns         Walk 10 feet on uneven surface  activity   Assist Walk 10 feet on uneven surfaces activity did not occur: Safety/medical concerns         Wheelchair     Assist Is the patient using a wheelchair?: Yes Type of Wheelchair: Manual    Wheelchair assist level: Supervision/Verbal cueing Max wheelchair distance: 20'    Wheelchair 50 feet with 2 turns activity    Assist        Assist Level: Minimal Assistance - Patient > 75%   Wheelchair 150 feet activity     Assist      Assist Level: Moderate Assistance - Patient 50 - 74%   Blood pressure 128/61, pulse 60, temperature 97.6 F (36.4 C), temperature source Oral, resp. rate 14, height 5\' 11"  (1.803 m), weight 103.1 kg, SpO2 99 %.  Medical Problem List and Plan: 1.  Cervical myelopathy secondary to epidural hematoma status post epidural hematoma 07/22/2021 after ACDF 07/19/2021.  Cervical collar as directed             -patient may  shower if dressing covered             -ELOS/Goals: 10-12 days min A to supervision  Con't PT and OT- CIR- sensation is biggest issue  2.  Antithrombotics: -DVT/anticoagulation:  Mechanical: Sequential  compression devices, below knee Bilateral lower extremities.  Check vascular study  10/5- since had epidural hematoma, don't want to start Lovenox without d/w NSU- Doppler s (-)             -antiplatelet therapy: N/A 3. Pain Management: Robaxin/oxycodone as needed -will add Gabapentin 300 mg BID for now.   10/3- stinging/burning pain better- will maintain dose for now.   10/14- con't Skelaxin and  Gabapentin- not taking Oxy much 4.  Mood: Celexa 20 mg daily             -antipsychotic agents: N/A 5. Neuropsych: This patient is capable of making decisions on his own behalf. 6. Skin/Wound Care: Routine skin checks. Foam dressings to buttocks 7. Fluids/Electrolytes/Nutrition: Routine in and outs with follow-up chemistries 8.  Hypertension.  Lopressor 50 mg twice daily, lisinopril 10 mg daily, Demadex 20 mg twice daily.  Monitor with increased mobility  10/10- will reduce Lisinopril to 5 mg daily since Cr is up- it's also likely the Demadex, however need to see exactly if can reduce dose.   10/11- reducing Demadex to 20 mg daily-   10/14- Cr 1.27- BP controlled really well so might be able ot reduce Lisinopril more? 9.  BPH with urinary retention,likely neurogenic bladder.  Flomax 0.8 mg daily.  Keep foley for 1-2 days, then try to remove and check PVRs.   10/4- pt insistent wants to keep foley longer due to 1 handedness with urinal- will d/c Friday-don't feel comfortable keeping longer. On max dose Flomax- hx of SPC.   10/9- Still no sense of bladder fullness. I/O cath volumes inconsistent. Continue q6 schedule. May have to adjust timing with meds and fluid intake  10/10- will change I/o caths to q4 hours since volumes so high esp since had cath volume of 1350cc last night.   10/11- caths q4 hours  10/12- cath volumes doing better- con't q4 hours- will have pt f/u with Dr Claudia Desanctis.  10.  Hyperlipidemia.  Crestor 11.  Hypothyroidism.  Synthroid 12.  CAD with CABG 2019.  Aspirin currently on hold 13.   Constipation/neurogenic bowel. Will change to Senokot and give Sorbitol 60cc x1 to get him to go.   10/4- LBM yesterday- doing well- con't regimen  10/5- will start Bowel program this evening- went over what it entails and how it works and takes 3-6 weeks to get into stable regimen. 10/10- having incontinent stools 10/12- doing bowel program- now having diarrhea- stopped senokot for now and will monitor; On Fibercon  10/13- will restart Senna Friday AM only 1 tab daily.  14.  GERD.  Protonix  15. Azotemia  10/3- Cr 1.20 which is baseline/slightly better; but BUN p to 34- up from 20s- will push fluids and recheck Thursday. 10/6- BUN 25 down from 34 and Cr 1.15- so also improved- will check weekly.  10/10- Cr up to 1.32 and BUN 28- likely due ot demadex 10/11- reduced Demadex to 20 mg daily and will recheck labs Thursday. 10/14- Cr 1.27- down from 1.32- BUN much better at 20- will recheck Monday.  16. Dispo 10/7- will allow therapeutic pass.    10/13- will ask about covid booster  10/14- pt cannot have covid booster til Nov 1st- will give Tetanus per pt request- already had flu shot.   LOS: 12 days A FACE TO FACE EVALUATION WAS PERFORMED  Mathew Postiglione 08/13/2021, 7:08 PM

## 2021-08-14 DIAGNOSIS — S064XAA Epidural hemorrhage with loss of consciousness status unknown, initial encounter: Secondary | ICD-10-CM

## 2021-08-14 DIAGNOSIS — N319 Neuromuscular dysfunction of bladder, unspecified: Secondary | ICD-10-CM

## 2021-08-14 DIAGNOSIS — E871 Hypo-osmolality and hyponatremia: Secondary | ICD-10-CM

## 2021-08-14 DIAGNOSIS — I10 Essential (primary) hypertension: Secondary | ICD-10-CM

## 2021-08-14 MED ORDER — LISINOPRIL 2.5 MG PO TABS
2.5000 mg | ORAL_TABLET | Freq: Every day | ORAL | Status: DC
  Administered 2021-08-15 – 2021-08-24 (×10): 2.5 mg via ORAL
  Filled 2021-08-14 (×10): qty 1

## 2021-08-14 NOTE — Progress Notes (Signed)
PROGRESS NOTE   Subjective/Complaints: Patient seen sitting up in bed this morning.  He states he slept well overnight after receiving sleep aid.  He is in good spirits and appreciative of his care.  ROS: Denies CP, SOB, N/V/D  Objective:   No results found. No results for input(s): WBC, HGB, HCT, PLT in the last 72 hours.   Recent Labs    08/13/21 0507  NA 134*  K 4.2  CL 99  CO2 28  GLUCOSE 96  BUN 20  CREATININE 1.27*  CALCIUM 8.9       Intake/Output Summary (Last 24 hours) at 08/14/2021 1221 Last data filed at 08/14/2021 1194 Gross per 24 hour  Intake 716 ml  Output 1550 ml  Net -834 ml      Pressure Injury 08/10/21 Buttocks Left;Right Stage 2 -  Partial thickness loss of dermis presenting as a shallow open injury with a red, pink wound bed without slough. progressed from stage 1 to stage 2. (Active)  08/10/21 1658  Location: Buttocks  Location Orientation: Left;Right  Staging: Stage 2 -  Partial thickness loss of dermis presenting as a shallow open injury with a red, pink wound bed without slough.  Wound Description (Comments): progressed from stage 1 to stage 2.  Present on Admission:     Physical Exam: Vital Signs Blood pressure 115/62, pulse 64, temperature (!) 97.5 F (36.4 C), temperature source Oral, resp. rate 18, height 5\' 11"  (1.803 m), weight 104.8 kg, SpO2 97 %. Constitutional: No distress . Vital signs reviewed. HENT: Normocephalic.  Atraumatic. Eyes: EOMI. No discharge. Cardiovascular: No JVD.  RRR. Respiratory: Normal effort.  No stridor.  Bilateral clear to auscultation. GI: Non-distended.  BS +. Skin: Warm and dry.  Intact. Psych: Normal mood.  Normal behavior. Musc: No edema in extremities.  No tenderness in extremities. Neuro: Alert Motor: RUE: Shoulder abduction, elbow flexion/extension 3+/5, handgrip 4/5 Right lower extremity: 4+/5 proximal distal LUE 5/5 LLE- HF 5-/5, KE  5-/5, and DF?PF 5-/5  Assessment/Plan: 1. Functional deficits which require 3+ hours per day of interdisciplinary therapy in a comprehensive inpatient rehab setting. Physiatrist is providing close team supervision and 24 hour management of active medical problems listed below. Physiatrist and rehab team continue to assess barriers to discharge/monitor patient progress toward functional and medical goals  Care Tool:  Bathing    Body parts bathed by patient: Right arm, Left arm, Chest, Abdomen, Front perineal area, Right upper leg, Left upper leg, Right lower leg, Face, Buttocks   Body parts bathed by helper: Left lower leg, Right lower leg Body parts n/a: Left arm, Buttocks, Right lower leg, Left lower leg   Bathing assist Assist Level: Minimal Assistance - Patient > 75%     Upper Body Dressing/Undressing Upper body dressing   What is the patient wearing?: Pull over shirt    Upper body assist Assist Level: Supervision/Verbal cueing    Lower Body Dressing/Undressing Lower body dressing      What is the patient wearing?: Pants, Underwear/pull up     Lower body assist Assist for lower body dressing: Minimal Assistance - Patient > 75%     Toileting Toileting  Toileting assist Assist for toileting: Total Assistance - Patient < 25%     Transfers Chair/bed transfer  Transfers assist     Chair/bed transfer assist level: Contact Guard/Touching assist     Locomotion Ambulation   Ambulation assist      Assist level: Moderate Assistance - Patient 50 - 74% Assistive device: Ethelene Hal Max distance: 65ft   Walk 10 feet activity   Assist  Walk 10 feet activity did not occur: Safety/medical concerns  Assist level: 2 helpers Assistive device: Parallel bars   Walk 50 feet activity   Assist Walk 50 feet with 2 turns activity did not occur: Safety/medical concerns         Walk 150 feet activity   Assist Walk 150 feet activity did not occur:  Safety/medical concerns         Walk 10 feet on uneven surface  activity   Assist Walk 10 feet on uneven surfaces activity did not occur: Safety/medical concerns         Wheelchair     Assist Is the patient using a wheelchair?: Yes Type of Wheelchair: Manual    Wheelchair assist level: Supervision/Verbal cueing Max wheelchair distance: 20'    Wheelchair 50 feet with 2 turns activity    Assist        Assist Level: Minimal Assistance - Patient > 75%   Wheelchair 150 feet activity     Assist      Assist Level: Moderate Assistance - Patient 50 - 74%   Blood pressure 115/62, pulse 64, temperature (!) 97.5 F (36.4 C), temperature source Oral, resp. rate 18, height 5\' 11"  (1.803 m), weight 104.8 kg, SpO2 97 %.  Medical Problem List and Plan: 1.  Cervical myelopathy secondary to epidural hematoma status post epidural hematoma 07/22/2021 after ACDF 07/19/2021.  Cervical collar as directed             Continue CIR 2.  Antithrombotics: -DVT/anticoagulation:  Mechanical: Sequential compression devices, below knee Bilateral lower extremities.    10/5- since had epidural hematoma, don't want to start Lovenox without d/w NSU- Doppler s (-)             -antiplatelet therapy: N/A 3. Pain Management: Robaxin/oxycodone as needed -will add Gabapentin 300 mg BID for now.   con't Skelaxin and  Gabapentin- not taking Oxy much  Improving with meds on 10/15 4. Mood: Celexa 20 mg daily             -antipsychotic agents: N/A 5. Neuropsych: This patient is capable of making decisions on his own behalf. 6. Skin/Wound Care: Routine skin checks. Foam dressings to buttocks 7. Fluids/Electrolytes/Nutrition: Routine in and outs 8.  Hypertension.  Lopressor 50 mg twice daily  Reduced lisinopril to 5 mg daily since Cr is up, decreased 2.5 on 10/16  Reduced Demadex to 20 mg daily  Controlled on 10/15 9.  BPH with urinary retention,likely neurogenic bladder.  Flomax 0.8 mg daily.     Cont I/O caths to q4 hours  10.  Hyperlipidemia.  Crestor 11.  Hypothyroidism.  Synthroid 12.  CAD with CABG 2019.  Aspirin currently on hold 13.  Constipation/neurogenic bowel.   On Fibercon Senna only 1 tab daily.  14.  GERD.  Protonix 15. Azotemia  Creatinine 1.27 on 10/14, labs ordered for Monday  Encourage fluids 16.  Mild hyponatremia  Sodium 134 on 10/14, may have some improvement with decrease in Demadex  LOS: 13 days A FACE TO FACE EVALUATION WAS PERFORMED  Taniaya Rudder Lorie Phenix 08/14/2021, 12:21 PM

## 2021-08-14 NOTE — Progress Notes (Signed)
Bowel program initiated with dig Stim performed and bisacodyl suppository inserted no bowel movement expressed with dig stim. Flatulence noted will notify oncoming nurse of incomplete bowel program

## 2021-08-14 NOTE — Progress Notes (Signed)
Occupational Therapy Session Note  Patient Details  Name: [name] MRN: [id number] Date of Birth: 01-16-1951  Today's Date: 08/14/2021 OT Individual Time: 0900-1000 OT Individual Time Calculation (min): 60 min    Short Term Goals: Week 1:  OT Short Term Goal 1 (Week 1): Pt will be able to sit to stand to RW with mod A of 1 and hold stand for 30 sec in prep for LB self care. OT Short Term Goal 1 - Progress (Week 1): Progressing toward goal OT Short Term Goal 2 (Week 1): Pt will demonstrate improved UE strength to don tshirt with min A OT Short Term Goal 2 - Progress (Week 1): Met OT Short Term Goal 3 (Week 1): Pt will be able to don shorts over feet with mod A. OT Short Term Goal 3 - Progress (Week 1): Met OT Short Term Goal 4 (Week 1): Pt will be able to use R hand to wash L arm with min A. OT Short Term Goal 4 - Progress (Week 1): Progressing toward goal  Skilled Therapeutic Interventions/Progress Updates:     Pt received in bed with bed  with no pain reported.  ADL: Pt completes ADL at overall MIN Level seated. Skilled interventions include: set up of clothing on R for R reach. Pt able to don shirt with S and pants with MAX A for threading 1LE and second LE into same hole. Unable to adjust. Pt completes MIN A sit to stand with RW and R hand splint. Pt completes SPT with MIN A to Left with MOD A to keep RUE in R splint. Pt declines grooming and eager to go to the gym  Therapeutic activity Standing balance activity working on RUE reaching to remove magnetic checkers from vertical board with 4 grip slips during 2 standing trials of  10 checkers each. VC for upright posture, hip extenssion, even weight bearing and trunk extension.  Seated block removal of velcro with loops initially- discussed zipper/loop adaptation for clothing fasteners. Pt also able to peel 4 blocks off velcroboard.   Finger>palm translation of large game pices 1 at a time with VC for slowing pace to make  movement deliberate which decreased grip slips.   Donned R claw splint with education on pressure spots to look for. Pt verbalized understanding  Pt left at end of session in bed with exit alarm on, call light in reach and all needs met   Therapy Documentation Precautions:  Precautions Precautions: Fall, Cervical Required Braces or Orthoses: Cervical Brace Cervical Brace: Soft collar Restrictions Weight Bearing Restrictions: No Other Position/Activity Restrictions: per MD orders, collar can be removed for shower, in bed and getting in the bathroom General:   Therapy/Group: Individual Therapy  Tonny Branch 08/14/2021, 5:50 AM

## 2021-08-15 NOTE — Progress Notes (Signed)
Occupational Therapy Session Note  Patient Details  Name: [name] MRN: [id number] Date of Birth: 02/01/1951  Today's Date: 08/16/2021 OT Individual Time: 3151-7616 OT Individual Time Calculation (min): 57 min   Short Term Goals: Week 1:  OT Short Term Goal 1 (Week 1): Pt will be able to sit to stand to RW with mod A of 1 and hold stand for 30 sec in prep for LB self care. OT Short Term Goal 1 - Progress (Week 1): Progressing toward goal OT Short Term Goal 2 (Week 1): Pt will demonstrate improved UE strength to don tshirt with min A OT Short Term Goal 2 - Progress (Week 1): Met OT Short Term Goal 3 (Week 1): Pt will be able to don shorts over feet with mod A. OT Short Term Goal 3 - Progress (Week 1): Met OT Short Term Goal 4 (Week 1): Pt will be able to use R hand to wash L arm with min A. OT Short Term Goal 4 - Progress (Week 1): Progressing toward goal  Skilled Therapeutic Interventions/Progress Updates:    Pt greeted in the w/c, ready to go and ADL needs met. He reported that donning socks and fastening buttons are still difficult for him due to fine motor deficits. Therefore session focus was placed on improving functional hand skills. Unable to locate button box to work on tying laces and buttons so set him up with PVC pipe design assembly, vcs to increase functional use of the weaker Rt hand to improve bimanual skills. Education provided on how to set up the sock aide however pt able to obtain figure 4 and was SO close to putting on both socks using adaptive strategies alone. Pt ultimately required Min A for the Lt sock only. He would benefit from trialing the sock aide during a future session. He transferred to bed via squat pivot and close supervision assist. Pt returned to bed and was left with all needs within reach and bed alarm set.   Therapy Documentation Precautions:  Precautions Precautions: Fall, Cervical Required Braces or Orthoses: Cervical Brace Cervical Brace: Soft  collar Restrictions Weight Bearing Restrictions: No Other Position/Activity Restrictions: per MD orders, collar can be removed for shower, in bed and getting in the bathroom Pain: pt reported being premedicated for pain    ADL: ADL Eating: Minimal assistance Grooming: Moderate assistance Upper Body Bathing: Moderate assistance Where Assessed-Upper Body Bathing: Wheelchair Lower Body Bathing: Moderate assistance Where Assessed-Lower Body Bathing: Wheelchair Upper Body Dressing: Maximal assistance Where Assessed-Upper Body Dressing: Wheelchair Lower Body Dressing: Dependent Where Assessed-Lower Body Dressing: Wheelchair Toileting: Dependent Where Assessed-Toileting: Bedside Commode Toilet Transfer: Maximal assistance Toilet Transfer Method: Other (comment) (STEDY)    Therapy/Group: Individual Therapy  [name] 08/16/2021, 12:49 PM

## 2021-08-15 NOTE — Progress Notes (Signed)
Bowel program initiated with Dig Stim performed and bisacodyl suppository inserted small type 1 bowel movement expressed with Dig Stim prior to bisacodyl suppository inserted. Will notify oncoming nurse of incomplete bowel program

## 2021-08-16 LAB — CBC WITH DIFFERENTIAL/PLATELET
Abs Immature Granulocytes: 0.03 10*3/uL (ref 0.00–0.07)
Basophils Absolute: 0 10*3/uL (ref 0.0–0.1)
Basophils Relative: 1 %
Eosinophils Absolute: 0.3 10*3/uL (ref 0.0–0.5)
Eosinophils Relative: 6 %
HCT: 31.5 % — ABNORMAL LOW (ref 39.0–52.0)
Hemoglobin: 10.4 g/dL — ABNORMAL LOW (ref 13.0–17.0)
Immature Granulocytes: 1 %
Lymphocytes Relative: 22 %
Lymphs Abs: 1 10*3/uL (ref 0.7–4.0)
MCH: 31 pg (ref 26.0–34.0)
MCHC: 33 g/dL (ref 30.0–36.0)
MCV: 93.8 fL (ref 80.0–100.0)
Monocytes Absolute: 0.3 10*3/uL (ref 0.1–1.0)
Monocytes Relative: 8 %
Neutro Abs: 2.7 10*3/uL (ref 1.7–7.7)
Neutrophils Relative %: 62 %
Platelets: 114 10*3/uL — ABNORMAL LOW (ref 150–400)
RBC: 3.36 MIL/uL — ABNORMAL LOW (ref 4.22–5.81)
RDW: 12.6 % (ref 11.5–15.5)
WBC: 4.3 10*3/uL (ref 4.0–10.5)
nRBC: 0 % (ref 0.0–0.2)

## 2021-08-16 LAB — BASIC METABOLIC PANEL
Anion gap: 7 (ref 5–15)
BUN: 18 mg/dL (ref 8–23)
CO2: 27 mmol/L (ref 22–32)
Calcium: 8.6 mg/dL — ABNORMAL LOW (ref 8.9–10.3)
Chloride: 100 mmol/L (ref 98–111)
Creatinine, Ser: 1.08 mg/dL (ref 0.61–1.24)
GFR, Estimated: >60 mL/min (ref >60)
Glucose, Bld: 99 mg/dL (ref 70–99)
Potassium: 4.3 mmol/L (ref 3.5–5.1)
Sodium: 134 mmol/L — ABNORMAL LOW (ref 135–145)

## 2021-08-16 NOTE — Plan of Care (Signed)
  Problem: RH Ambulation Goal: LTG Patient will ambulate in controlled environment (PT) Description: LTG: Patient will ambulate in a controlled environment, # of feet with assistance (PT). Flowsheets (Taken 08/16/2021 1723) LTG: Pt will ambulate in controlled environ  assist needed:: (downgrade goal due to slow progress) Moderate Assistance - Patient 50 - 74% LTG: Ambulation distance in controlled environment: 50 ft with LRAD Note: Downgrade goal due to slow progress Goal: LTG Patient will ambulate in home environment (PT) Description: LTG: Patient will ambulate in home environment, # of feet with assistance (PT). Flowsheets (Taken 08/16/2021 1723) LTG: Pt will ambulate in home environ  assist needed:: (d/c goal due to slow progress) -- Note: D/c goal due to slow progress

## 2021-08-16 NOTE — Progress Notes (Signed)
PROGRESS NOTE   Subjective/Complaints: Pt reports voided some, but sounds ike overflow- had no control over it- and occ feels like needs to go, but then goes with no control right after that.   We discussed that his volumes are still pretty high- needs to decrease intake somewhat- ~ 2L/day- try gum, sugar free hard candies, etc.    ROS:  Pt denies SOB, abd pain, CP, N/V/C/D, and vision changes   Objective:   No results found. Recent Labs    08/16/21 0607  WBC 4.3  HGB 10.4*  HCT 31.5*  PLT 114*     Recent Labs    08/16/21 0607  NA 134*  K 4.3  CL 100  CO2 27  GLUCOSE 99  BUN 18  CREATININE 1.08  CALCIUM 8.6*      Intake/Output Summary (Last 24 hours) at 08/16/2021 1402 Last data filed at 08/16/2021 7902 Gross per 24 hour  Intake 740 ml  Output 1800 ml  Net -1060 ml     Pressure Injury 08/10/21 Buttocks Left;Right Stage 2 -  Partial thickness loss of dermis presenting as a shallow open injury with a red, pink wound bed without slough. progressed from stage 1 to stage 2. (Active)  08/10/21 1658  Location: Buttocks  Location Orientation: Left;Right  Staging: Stage 2 -  Partial thickness loss of dermis presenting as a shallow open injury with a red, pink wound bed without slough.  Wound Description (Comments): progressed from stage 1 to stage 2.  Present on Admission:     Physical Exam: Vital Signs Blood pressure (!) 125/55, pulse 61, temperature 98.1 F (36.7 C), temperature source Oral, resp. rate 18, height 5\' 11"  (1.803 m), weight 101.3 kg, SpO2 100 %.    General: awake, alert, appropriate, sitting up in bed; wife at bedside; NAD HENT: conjugate gaze; oropharynx moist CV: regular rate; no JVD Pulmonary: CTA B/L; no W/R/R- good air movement GI: soft, NT, ND, (+)BS Psychiatric: appropriate; interactive Neurological: alert  Musc: No edema in extremities.  No tenderness in  extremities. Neuro: Alert Motor: RUE up to 4/5 and RLE 4/5 LUE 5/5 LLE- HF 5-/5, KE 5-/5, and DF?PF 5-/5  Assessment/Plan: 1. Functional deficits which require 3+ hours per day of interdisciplinary therapy in a comprehensive inpatient rehab setting. Physiatrist is providing close team supervision and 24 hour management of active medical problems listed below. Physiatrist and rehab team continue to assess barriers to discharge/monitor patient progress toward functional and medical goals  Care Tool:  Bathing    Body parts bathed by patient: Right arm, Left arm, Chest, Abdomen, Front perineal area, Right upper leg, Left upper leg, Right lower leg, Left lower leg, Face   Body parts bathed by helper: Buttocks Body parts n/a: Left arm, Buttocks, Right lower leg, Left lower leg   Bathing assist Assist Level: Minimal Assistance - Patient > 75%     Upper Body Dressing/Undressing Upper body dressing   What is the patient wearing?: Pull over shirt    Upper body assist Assist Level: Supervision/Verbal cueing    Lower Body Dressing/Undressing Lower body dressing      What is the patient wearing?: Incontinence brief, Pants  Lower body assist Assist for lower body dressing: Moderate Assistance - Patient 50 - 74%     Toileting Toileting    Toileting assist Assist for toileting: Total Assistance - Patient < 25%     Transfers Chair/bed transfer  Transfers assist     Chair/bed transfer assist level: Contact Guard/Touching assist     Locomotion Ambulation   Ambulation assist      Assist level: Moderate Assistance - Patient 50 - 74% Assistive device: Ethelene Hal Max distance: 4ft   Walk 10 feet activity   Assist  Walk 10 feet activity did not occur: Safety/medical concerns  Assist level: 2 helpers Assistive device: Parallel bars   Walk 50 feet activity   Assist Walk 50 feet with 2 turns activity did not occur: Safety/medical concerns         Walk 150  feet activity   Assist Walk 150 feet activity did not occur: Safety/medical concerns         Walk 10 feet on uneven surface  activity   Assist Walk 10 feet on uneven surfaces activity did not occur: Safety/medical concerns         Wheelchair     Assist Is the patient using a wheelchair?: Yes Type of Wheelchair: Manual    Wheelchair assist level: Supervision/Verbal cueing Max wheelchair distance: 20'    Wheelchair 50 feet with 2 turns activity    Assist        Assist Level: Minimal Assistance - Patient > 75%   Wheelchair 150 feet activity     Assist      Assist Level: Moderate Assistance - Patient 50 - 74%   Blood pressure (!) 125/55, pulse 61, temperature 98.1 F (36.7 C), temperature source Oral, resp. rate 18, height 5\' 11"  (1.803 m), weight 101.3 kg, SpO2 100 %.  Medical Problem List and Plan: 1.  Cervical myelopathy secondary to epidural hematoma status post epidural hematoma 07/22/2021 after ACDF 07/19/2021.  Cervical collar as directed             con't PT, and OT-  2.  Antithrombotics: -DVT/anticoagulation:  Mechanical: Sequential compression devices, below knee Bilateral lower extremities.    10/5- since had epidural hematoma, don't want to start Lovenox without d/w NSU- Doppler s (-)             -antiplatelet therapy: N/A 3. Pain Management: Robaxin/oxycodone as needed -will add Gabapentin 300 mg BID for now.   con't Skelaxin and  Gabapentin- not taking Oxy much  10/17- pain controlled- con't regimen 4. Mood: Celexa 20 mg daily             -antipsychotic agents: N/A 5. Neuropsych: This patient is capable of making decisions on his own behalf. 6. Skin/Wound Care: Routine skin checks. Foam dressings to buttocks 7. Fluids/Electrolytes/Nutrition: Routine in and outs 8.  Hypertension.  Lopressor 50 mg twice daily  Reduced lisinopril to 5 mg daily since Cr is up, decreased 2.5 on 10/16  Reduced Demadex to 20 mg daily  Controlled on  10/15  10/17- pt's Cr down to 1.08 and BUN 18- doing much better- and BP still controlled- con't regimen 9.  BPH with urinary retention,likely neurogenic bladder.  Flomax 0.8 mg daily.    Cont I/O caths to q4 hours 10/17- having episodes of overflow- asked him to reduce amount of fluid to 2L/day- use gum, hard candies, etc.   10.  Hyperlipidemia.  Crestor 11.  Hypothyroidism.  Synthroid 12.  CAD with CABG 2019.  Aspirin currently on hold 13.  Constipation/neurogenic bowel.   On Fibercon Senna only 1 tab daily.  14.  GERD.  Protonix 15. Azotemia  Creatinine 1.27 on 10/14, labs ordered for Monday  Encourage fluids  10/17- Cr 1.08 and BUN 18 doing better- reduce slightly due to cath volumes 16.  Mild hyponatremia  Sodium 134 on 10/14, may have some improvement with decrease in Demadex  LOS: 15 days A FACE TO FACE EVALUATION WAS PERFORMED  Bhavik Cabiness 08/16/2021, 2:02 PM

## 2021-08-16 NOTE — Progress Notes (Signed)
Occupational Therapy Session Note  Patient Details  Name: RALF KONOPKA MRN: [id number] Date of Birth: 1951-01-03  Today's Date: 08/16/2021 OT Individual Time: 0700-0800 OT Individual Time Calculation (min): 60 min    Short Term Goals: Week 2:  OT Short Term Goal 1 (Week 2): Pt will be able to sit to stand to RW with mod A of 1 and hold stand for 30 sec in prep for LB self care. OT Short Term Goal 2 (Week 2): Pt will be able to use R hand to wash L arm with min A. OT Short Term Goal 3 (Week 2): Pt will bathing tasks with min A with sit<>stand from W/C or TTB OT Short Term Goal 4 (Week 2): Pt will complete LB dressing tasks with mod A sit<>stand to pull pants over hips  Skilled Therapeutic Interventions/Progress Updates:    Pt resting in bed upon arrival and requested to take a shower. Supine>sit EOB with supervision. Squat pivot transfer to w/c and w/c>TTB with CGA. Bathing with min A. LB dressing with mod A. Sit<>stand with min A. Standing balance with min A. Pt required assistance pulling pants over hips while standing. Pt used bath mitt on Lt hand to assist with bathing. Pt remained in w/c with all needs within reach. Seat alarm activated.   Therapy Documentation Precautions:  Precautions Precautions: Fall, Cervical Required Braces or Orthoses: Cervical Brace Cervical Brace: Soft collar Restrictions Weight Bearing Restrictions: No Other Position/Activity Restrictions: per MD orders, collar can be removed for shower, in bed and getting in the bathroom Pain: Pain Assessment Pain Scale: 0-10 Pain Score: 7  Pain Type: Acute pain Pain Location: Shoulder Pain Orientation: Right;Left Pain Radiating Towards: neck Repositioned and warm shower   Therapy/Group: Individual Therapy  [name] 08/16/2021, 9:03 AM

## 2021-08-16 NOTE — Progress Notes (Signed)
Successful result from bowel program, digital stimulation x1, tolerated well by patient, results noted in chart

## 2021-08-16 NOTE — Progress Notes (Signed)
Physical Therapy Session Note  Patient Details  Name: [name] MRN: [id number] Date of Birth: 1951-04-15  Today's Date: 08/16/2021 PT Individual Time: 0830-0900 PT Individual Time Calculation (min): 30 min   Short Term Goals: Week 1:  PT Short Term Goal 1 (Week 1): Pt will perform bed mobility consistently with minA. PT Short Term Goal 2 (Week 1): Pt wil perform sit to stand transfer consistently with minA. PT Short Term Goal 2 - Progress (Week 1): Met PT Short Term Goal 3 (Week 1): Pt will performs bed to chair transfer consistently with minA. PT Short Term Goal 3 - Progress (Week 1): Met PT Short Term Goal 4 (Week 1): Pt will ambulate x25' with modA +1 and LRAD. PT Short Term Goal 4 - Progress (Week 1): Progressing toward goal Week 2:  PT Short Term Goal 1 (Week 2): Pt will ambulate x 25 ft with LRAD and mod A PT Short Term Goal 2 (Week 2): Pt will perform least restrictive transfers with CGA consistently PT Short Term Goal 3 (Week 2): Pt will perform w/c mobility x 100 ft with Supervision  Skilled Therapeutic Interventions/Progress Updates:    Pt seated in w/c on arrival and agreeable to therapy. Pt reports pain in R upper trap, but that he was premedicated and did not provide rating. Pt propelled w/c with BUE x 200 ft for endurance and UE strength. Pt then directed in Sit to stand to RW with CGA. Pt performed steps forward and back x 4 bouts with RW and min A for balance and occ cueing for poor motor planning. Pt remained in chair at end of session and was left with all needs in reach and alarm active.   Therapy Documentation Precautions:  Precautions Precautions: Fall, Cervical Required Braces or Orthoses: Cervical Brace Cervical Brace: Soft collar Restrictions Weight Bearing Restrictions: No Other Position/Activity Restrictions: per MD orders, collar can be removed for shower, in bed and getting in the bathroom    Therapy/Group: Individual Therapy  Mickel Fuchs 08/16/2021, 4:11 PM

## 2021-08-16 NOTE — Progress Notes (Signed)
Physical Therapy Weekly Progress Note  Patient Details  Name: [name] MRN: [id number] Date of Birth: 12-02-1950  Beginning of progress report period: August 09, 2021 End of progress report period: August 16, 2021  Today's Date: 08/16/2021 PT Individual Time: 0900-1000 PT Individual Time Calculation (min): 60 min   Patient has met 2 of 3 short term goals.  Pt is making slow but steady progress towards therapy goals. He is currently at Supervision level for bed mobility with use of bedrail, CGA for squat pivot transfers, has been able to initiate sit to stand and stand pivot transfer with RW and is at min A level for those, is at Supervision level for w/c mobility up to 200 ft with use of BUE, and has been able to perform short distance gait in // bars with assist x 2 or in LiteGait dependently. Pt remains limited by decreased proprioception and coordination in BLE. Pt exhibits great motivation and participation in therapy sessions.  Patient continues to demonstrate the following deficits muscle weakness and muscle joint tightness, decreased cardiorespiratoy endurance, abnormal tone, unbalanced muscle activation, ataxia, and decreased coordination, and decreased standing balance, decreased postural control, and decreased balance strategies and therefore will continue to benefit from skilled PT intervention to increase functional independence with mobility.  Patient not progressing toward long term goals.  See goal revision..  Plan of care revisions: Downgraded gait goal in controlled environment with therapy to 50 ft with mod A, discharged gait goal for home environment due to slow progress with gait training.  PT Short Term Goals Week 2:  PT Short Term Goal 1 (Week 2): Pt will ambulate x 25 ft with LRAD and mod A PT Short Term Goal 1 - Progress (Week 2): Progressing toward goal PT Short Term Goal 2 (Week 2): Pt will perform least restrictive transfers with CGA consistently PT Short Term  Goal 2 - Progress (Week 2): Met PT Short Term Goal 3 (Week 2): Pt will perform w/c mobility x 100 ft with Supervision PT Short Term Goal 3 - Progress (Week 2): Met Week 3:  PT Short Term Goal 1 (Week 3): =LTG due to ELOS  Skilled Therapeutic Interventions/Progress Updates:    Pt received seated in w/c in room, agreeable to PT session. No complaints of pain. Dependent transport via w/c to/from therapy gym for time and energy conservation. Sit to stand with min A to RW throughout session. Stand pivot transfer with RW and min A, increased time and cues for sequencing needed. Utilized 5# ankle weights bilaterally during session for improved proprioceptive input. Forward/backward stepping with RW and min A for balance, 2 x 5 reps B before onset of knee buckling. Standing alt L/R 4" step-taps with RW and min A for balance, 2 x 10 reps to fatigue. Sidesteps L/R x 5 ft each direction with RW and mod A for balance, max cueing for sequencing. Pt tends to exhibit very narrow BOS in standing with one LOB posteriorly resulting in unplanned sit on mat table. Encouraged pt to attempt to remain standing even with LOB to problem solve how to recover balance. Pt fatigues very quickly with standing activity this date, requires several seated rest breaks between tasks. Pt left seated in w/c in room with needs in reach, chair alarm in place at end of session.  Therapy Documentation Precautions:  Precautions Precautions: Fall, Cervical Required Braces or Orthoses: Cervical Brace Cervical Brace: Soft collar Restrictions Weight Bearing Restrictions: No Other Position/Activity Restrictions: per MD orders, collar can  be removed for shower, in bed and getting in the bathroom      Therapy/Group: Individual Therapy   Excell Seltzer, PT, DPT, CSRS 08/16/2021, 5:12 PM

## 2021-08-17 NOTE — Progress Notes (Signed)
Patient ID: [name], male   DOB: 1951-08-27, 70 y.o.   MRN: [id number]  SW met with pt in room and called his wife [name] [phone]) to provide updates from team conference, and inform d/c date remains 10/25. SW discussed [facility] preference. SW to provide list. SW confirms family edu remains 10/20 at [place] provided [facility] list and will f/u on preference.   Loralee Pacas, MSW, [place] Office: [phone] Cell: [phone] Fax: [phone]

## 2021-08-17 NOTE — Progress Notes (Signed)
PROGRESS NOTE   Subjective/Complaints:  Pt reports still having ahrd time walking and heavy weights make things "heavy"- Best night's sleep he's had here last night.  Had bowel accident after bowel program- due to timing, per pt.    ROS:   Pt denies SOB, abd pain, CP, N/V/C/D, and vision changes   Objective:   No results found. Recent Labs    08/16/21 0607  WBC 4.3  HGB 10.4*  HCT 31.5*  PLT 114*     Recent Labs    08/16/21 0607  NA 134*  K 4.3  CL 100  CO2 27  GLUCOSE 99  BUN 18  CREATININE 1.08  CALCIUM 8.6*      Intake/Output Summary (Last 24 hours) at 08/17/2021 1049 Last data filed at 08/17/2021 1027 Gross per 24 hour  Intake 720 ml  Output 2760 ml  Net -2040 ml     Pressure Injury 08/10/21 Buttocks Left;Right Stage 2 -  Partial thickness loss of dermis presenting as a shallow open injury with a red, pink wound bed without slough. progressed from stage 1 to stage 2. (Active)  08/10/21 1658  Location: Buttocks  Location Orientation: Left;Right  Staging: Stage 2 -  Partial thickness loss of dermis presenting as a shallow open injury with a red, pink wound bed without slough.  Wound Description (Comments): progressed from stage 1 to stage 2.  Present on Admission:     Physical Exam: Vital Signs Blood pressure 108/72, pulse 64, temperature 98.3 F (36.8 C), temperature source Oral, resp. rate 16, height 5\' 11"  (1.803 m), weight 101.3 kg, SpO2 97 %.     General: awake, alert, appropriate, sitting EOB working with OTA; NAD HENT: conjugate gaze; oropharynx moist CV: regular rate; no JVD Pulmonary: CTA B/L; no W/R/R- good air movement GI: soft, NT, ND, (+)BS Psychiatric: appropriate; bright affect Neurological: Ox3  Musc: No edema in extremities.  No tenderness in extremities. Neuro: Alert Motor: RUE- 4+/5 proximally- grip and FA 4-/5 LUE- 5/5 except 4+ in grip and FA     Assessment/Plan: 1. Functional deficits which require 3+ hours per day of interdisciplinary therapy in a comprehensive inpatient rehab setting. Physiatrist is providing close team supervision and 24 hour management of active medical problems listed below. Physiatrist and rehab team continue to assess barriers to discharge/monitor patient progress toward functional and medical goals  Care Tool:  Bathing    Body parts bathed by patient: Right arm, Left arm, Chest, Abdomen, Front perineal area, Right upper leg, Left upper leg, Right lower leg, Left lower leg, Face   Body parts bathed by helper: Buttocks Body parts n/a: Left arm, Buttocks, Right lower leg, Left lower leg   Bathing assist Assist Level: Minimal Assistance - Patient > 75%     Upper Body Dressing/Undressing Upper body dressing   What is the patient wearing?: Pull over shirt, Orthosis    Upper body assist Assist Level: Minimal Assistance - Patient > 75%    Lower Body Dressing/Undressing Lower body dressing      What is the patient wearing?: Incontinence brief, Pants     Lower body assist Assist for lower body dressing: Moderate Assistance - Patient  29 - 74%     Toileting Toileting    Toileting assist Assist for toileting: Total Assistance - Patient < 25%     Transfers Chair/bed transfer  Transfers assist     Chair/bed transfer assist level: Minimal Assistance - Patient > 75%     Locomotion Ambulation   Ambulation assist      Assist level: Moderate Assistance - Patient 50 - 74% Assistive device: Ethelene Hal Max distance: 63ft   Walk 10 feet activity   Assist  Walk 10 feet activity did not occur: Safety/medical concerns  Assist level: 2 helpers Assistive device: Parallel bars   Walk 50 feet activity   Assist Walk 50 feet with 2 turns activity did not occur: Safety/medical concerns         Walk 150 feet activity   Assist Walk 150 feet activity did not occur: Safety/medical  concerns         Walk 10 feet on uneven surface  activity   Assist Walk 10 feet on uneven surfaces activity did not occur: Safety/medical concerns         Wheelchair     Assist Is the patient using a wheelchair?: Yes Type of Wheelchair: Manual    Wheelchair assist level: Supervision/Verbal cueing Max wheelchair distance: 20'    Wheelchair 50 feet with 2 turns activity    Assist        Assist Level: Minimal Assistance - Patient > 75%   Wheelchair 150 feet activity     Assist      Assist Level: Moderate Assistance - Patient 50 - 74%   Blood pressure 108/72, pulse 64, temperature 98.3 F (36.8 C), temperature source Oral, resp. rate 16, height 5\' 11"  (1.803 m), weight 101.3 kg, SpO2 97 %.  Medical Problem List and Plan: 1.  Cervical myelopathy secondary to epidural hematoma status post epidural hematoma 07/22/2021 after ACDF 07/19/2021.  Cervical collar as directed             con't PT and OT/CIR- still having difficulties with walking.  2.  Antithrombotics: -DVT/anticoagulation:  Mechanical: Sequential compression devices, below knee Bilateral lower extremities.    10/5- since had epidural hematoma, don't want to start Lovenox without d/w NSU- Doppler s (-)             -antiplatelet therapy: N/A 3. Pain Management: Robaxin/oxycodone as needed -will add Gabapentin 300 mg BID for now.   con't Skelaxin and  Gabapentin- not taking Oxy much  10/17- pain controlled- con't regimen 4. Mood: Celexa 20 mg daily             -antipsychotic agents: N/A 5. Neuropsych: This patient is capable of making decisions on his own behalf. 6. Skin/Wound Care: Routine skin checks. Foam dressings to buttocks 7. Fluids/Electrolytes/Nutrition: Routine in and outs 8.  Hypertension.  Lopressor 50 mg twice daily  Reduced lisinopril to 5 mg daily since Cr is up, decreased 2.5 on 10/16  Reduced Demadex to 20 mg daily  Controlled on 10/15  10/17- pt's Cr down to 1.08 and BUN 18-  doing much better- and BP still controlled- con't regimen  10/18- BP 108/72- con't regimen 9.  BPH with urinary retention,likely neurogenic bladder.  Flomax 0.8 mg daily.    Cont I/O caths to q4 hours 10/17- having episodes of overflow- asked him to reduce amount of fluid to 2L/day- use gum, hard candies, etc.   10.  Hyperlipidemia.  Crestor 11.  Hypothyroidism.  Synthroid 12.  CAD with CABG 2019.  Aspirin currently on hold 13.  Constipation/neurogenic bowel.   On Fibercon Senna only 1 tab daily.  14.  GERD.  Protonix 15. Azotemia  Creatinine 1.27 on 10/14, labs ordered for Monday  Encourage fluids  10/17- Cr 1.08 and BUN 18 doing better- reduce slightly due to cath volumes  10/18- pt limited intake so much- went over again with pt to tell him that he has to drink 2Liters/day- just not 3L/day.  16.  Mild hyponatremia  Sodium 134 on 10/14, may have some improvement with decrease in Demadex  LOS: 16 days A FACE TO [place] 08/17/2021, 10:49 AM

## 2021-08-17 NOTE — Progress Notes (Signed)
Physical Therapy Session Note  Patient Details  Name: [name] MRN: [id number] Date of Birth: 1951/01/20  Today's Date: 08/17/2021 PT Individual Time: 0900-1000 PT Individual Time Calculation (min): 60 min   Short Term Goals: Week 3:  PT Short Term Goal 1 (Week 3): =LTG due to ELOS  Skilled Therapeutic Interventions/Progress Updates:    Pt received seated in w/c in room, agreeable to PT session. Pt reports some pain in his neck at rest, not rated. Nursing able to provide pain medication during session. Session focus on gait training with use of LiteGait this session. Sit to stand with min A to RW. Pt able to tolerate standing x 5 min with RW and CGA while LiteGait sling is donned. Pt exhibits onset of trunk and knee flexion in standing with onset of fatigue, able to correct with cues. Ambulation 2 x 25 ft with LiteGait. Use of 5# ankle weights during first round of gait for improved proprioception. Pt reports limbs feel heavy and difficult to move with use of weights so removed for 2nd round of gait. Pt exhibits decreased B hip and knee flexion during gait as well as ataxic BLE, gait mechanics with decreased smoothness with removal of weights. Pt also fatigues very quickly during gait training. Manual w/c propulsion x 100 ft with use of BUE at Supervision level before onset of fatigue. Squat pivot transfer w/c to/from bed at Supervision level. Supine to/from sit at Supervision level on flat bed. Pt left supine in bed for nursing to scan bladder, needs in reach, bed alarm in place.  Therapy Documentation Precautions:  Precautions Precautions: Fall, Cervical Required Braces or Orthoses: Cervical Brace Cervical Brace: Soft collar Restrictions Weight Bearing Restrictions: No Other Position/Activity Restrictions: per MD orders, collar can be removed for shower, in bed and getting in the bathroom    Therapy/Group: Individual Therapy   Excell Seltzer, PT, DPT, CSRS  08/17/2021, 1:57 PM

## 2021-08-17 NOTE — Progress Notes (Signed)
Occupational Therapy Session Note  Patient Details  Name: [name] MRN: [id number] Date of Birth: 12/11/1950  Today's Date: 08/17/2021 OT Individual Time: 3159-4585 OT Individual Time Calculation (min): 58 min    Short Term Goals: Week 2:  OT Short Term Goal 1 (Week 2): Pt will be able to sit to stand to RW with mod A of 1 and hold stand for 30 sec in prep for LB self care. OT Short Term Goal 2 (Week 2): Pt will be able to use R hand to wash L arm with min A. OT Short Term Goal 3 (Week 2): Pt will bathing tasks with min A with sit<>stand from W/C or TTB OT Short Term Goal 4 (Week 2): Pt will complete LB dressing tasks with mod A sit<>stand to pull pants over hips  Skilled Therapeutic Interventions/Progress Updates:    Pt received semi-reclined in bed, no c/o pain, agreeable to therapy. Session focus on activity tolerance, RUE NMR, functional transfers in prep for improved ADL/IADL/func mobility performance + decreased caregiver burden. Came to sitting EOB with S and increased time, squat-pivot to w/c with S. ADL needs already met. Pt self-propelled w/c to and from 4th floor gym with S and increased time. Cues for BUE coordination for efficient strokes. Reports increased in B shoulder pain. Practiced propelling w/c with use of BLE only to offset pain.   Pt practiced tossing/catching ball back and forth from various distances with BUE. Only two instances of dropping ball. Completed massed practice of the following with tan theraputty and RUE: rolling putty into log, ball, and pinching and pull off pieces. Finally, practice picking up and placing velcro blocks with R hand. Only 2 instances of dropping blocks, but required increased time to complete and cues to avoid trunk compensation in setting of decreased R shoulder flexion.  Squat-pivot back to bed and returned to S. Able to boost self up in bed with use of bed features.   Pt left semi-reclined in bed with bed alarm engaged, call bell in  reach, and all immediate needs met.    Therapy Documentation Precautions:  Precautions Precautions: Fall, Cervical Required Braces or Orthoses: Cervical Brace Cervical Brace: Soft collar Restrictions Weight Bearing Restrictions: No Other Position/Activity Restrictions: per MD orders, collar can be removed for shower, in bed and getting in the bathroom  Pain:  See session note ADL: See Care Tool for more details. Therapy/Group: Individual Therapy  Volanda Napoleon MS, OTR/L  08/17/2021, 6:43 AM

## 2021-08-17 NOTE — Progress Notes (Signed)
Occupational Therapy Weekly Progress Note  Patient Details  Name: [name] MRN: [id number] Date of Birth: 04-23-1951  Beginning of progress report period: August 10, 2021 End of progress report period: August 17, 2021  Patient has met 4 of 4 short term goals.  Pt made steady progress with BADLs and functional transfers during the past week. Pt requires min A for donning soft cervical collar but dons pullover shirt with supervision. Pt is able to thread BLE into pants and pull over hips with min A for standing balance. Pt has started using a sock aide for donning Lt sock. Max A for donning shoes. Sit<>stand with min A and standing balance with min A. Toilet hygiene with max A. Pt with ongoing absent BUE sensation. Family education scheduled for 10/20.  Patient continues to demonstrate the following deficits: muscle weakness, decreased cardiorespiratoy endurance, impaired timing and sequencing, abnormal tone, unbalanced muscle activation, and decreased coordination, and decreased sitting balance, decreased standing balance, and decreased balance strategies and therefore will continue to benefit from skilled OT intervention to enhance overall performance with BADL and Reduce care partner burden.  Patient progressing toward long term goals..  Continue plan of care.  OT Short Term Goals Week 2:  OT Short Term Goal 1 (Week 2): Pt will be able to sit to stand to RW with mod A of 1 and hold stand for 30 sec in prep for LB self care. OT Short Term Goal 1 - Progress (Week 2): Met OT Short Term Goal 2 (Week 2): Pt will be able to use R hand to wash L arm with min A. OT Short Term Goal 2 - Progress (Week 2): Met OT Short Term Goal 3 (Week 2): Pt will bathing tasks with min A with sit<>stand from W/C or TTB OT Short Term Goal 3 - Progress (Week 2): Met OT Short Term Goal 4 (Week 2): Pt will complete LB dressing tasks with mod A sit<>stand to pull pants over hips OT Short Term Goal 4 - Progress (Week  2): Met Week 3:  OT Short Term Goal 1 (Week 3): STG=LTG 2/2 ELOS (continue working towards min A overll goals)    [name] 08/17/2021, 2:37 PM

## 2021-08-17 NOTE — Progress Notes (Signed)
Encouraged to drink fluids this am. Bladder scan volume 47mls after intermittent cath last night. Required prn pain pills for breakthrough pain. Safety maintained.

## 2021-08-17 NOTE — Patient Care Conference (Signed)
Inpatient RehabilitationTeam Conference and Plan of Care Update Date: 08/17/2021   Time: 11:24 AM    Patient Name: [name]      Medical Record Number: [id number]  Date of Birth: July 04, 1951 Sex: Male         Room/Bed: 5C18C/5C18C-01 Payor Info: Payor: [employer] MEDICARE / Plan: [facility] MEDICARE / Product Type: *No Product type* /    Admit Date/Time:  08/01/2021  3:32 PM  Primary Diagnosis:  Acute incomplete quadriplegia [facility])  Hospital Problems: Principal Problem:   Acute incomplete quadriplegia ([place]) Active Problems:   Epidural hematoma   Pressure injury of skin   Hyponatremia   Neurogenic bladder    Expected Discharge Date: Expected Discharge Date: 08/24/21  Team Members Present: Physician leading conference: Dr. Courtney Heys Social Worker Present: Loralee Pacas, [place] Nurse Present: Dorthula Nettles, RN PT Present: Excell Seltzer, PT OT Present: Roanna Epley, [place], OT PPS Coordinator present : Gunnar Fusi, SLP     Current Status/Progress Goal Weekly Team Focus  Bowel/Bladder   bowel program, I&O cathing, LBM 10/16  Continent of bowel and bladder. Void indipendently without use of I/O catheterization  regain bowel/bladder control   Swallow/Nutrition/ Hydration             ADL's   bathing-min A: UB dressing-supervision; LB dressing-max A: functional transfers-min A: toileting-max A  Min A  BADL retraining, education; BUE strengthening/fucntion; safety awareness   Mobility   Supervision bed mobility, CGA squat pivot transfer, min A sit to stand and SPT with RW, gait in // bars or with LiteGait, w/c mobility Supervision  Supervision overall, downgraded gait goals to short distance with mod A  LE NMR, transfers, gait, w/c mobility   Communication             Safety/Cognition/ Behavioral Observations            Pain   7/10 pain reported  <3  assess pain q 4 hr and prn   Skin   stage 2 buttocks, skin peeled at buttocks  Remain free from  further skin breakdown. Remain free from signs/symptoms of infection.  assess skin q shift and prn     Discharge Planning:  wife and daughter to assist, wife more limited with assist physical assist will fal on daughter who is trained CNA   Team Discussion: Kidney function improving. Adjusting medications, I&O caths and bowel program. If doesn't start voiding will place foley at discharge. Reports 7/10 pain, Trazodone for sleep. Stage 2 to buttocks with foam dressing, skin peeling at buttocks. Family education Thursday with wife and daughter. Patient on target to meet rehab goals: yes, supervision overall. Dependent for gait, good progress at W/C level. Set-up W/C eval. Was able to accomplish lower body dressing and socks today. Working on toileting from a hygiene stand point.   *See Care Plan and progress notes for long and short-term goals.   Revisions to Treatment Plan:  Adjusting medications.  Teaching Needs: Family education, medication management, pain and sleep management, skin/wound care, safety awareness.  Current Barriers to Discharge: Decreased caregiver support, Home enviroment access/layout, Incontinence, Neurogenic bowel and bladder, Wound care, Lack of/limited family support, Weight, Weight bearing restrictions, and Medication compliance  Possible Resolutions to Barriers: Family education, may have to have foley at discharge, work on fluid intake, W/C eval scheduled.     Medical Summary Current Status: bowel incontinent- cathing q4 hours- rates pain 7/10- takes meds; stage II buttocks- and skin peeling on buttocks- was drinking too  much- now too little-  Barriers to Discharge: Weight;Weight bearing restrictions;Wound care;Home enviroment access/layout;Incontinence;Neurogenic Bowel & Bladder;Medical stability  Barriers to Discharge Comments: if doesn't pee by d/c; will place foley on day of d/c- and f/u with urology Possible Resolutions to Barriers/Weekly Focus: strength  improving, sensation is very poor/proprioception; discouraged by that- ghome on w/c level; needs w/c eval by w/c company- [employer]; eudcated on intake requirements- d/c 10/25   Continued Need for Acute Rehabilitation Level of Care: The patient requires daily medical management by a physician with specialized training in physical medicine and rehabilitation for the following reasons: Direction of a multidisciplinary physical rehabilitation program to maximize functional independence : Yes Medical management of patient stability for increased activity during participation in an intensive rehabilitation regime.: Yes Analysis of laboratory values and/or radiology reports with any subsequent need for medication adjustment and/or medical intervention. : Yes   I attest that I was present, lead the team conference, and concur with the assessment and plan of the team.   Cristi Loron 08/17/2021, 4:08 PM

## 2021-08-17 NOTE — Progress Notes (Signed)
Patient had suppository inserted close to end of day shift. Had a medium bowel movement at stated hour. Formed mushy brown stools (with few balls). Assisted with toileting and made comfortable in bed. Safety maintained.

## 2021-08-17 NOTE — Progress Notes (Signed)
Occupational Therapy Session Note  Patient Details  Name: [name] MRN: [id number] Date of Birth: Oct 19, 1951  Today's Date: 08/17/2021 OT Individual Time: 0700-0800 OT Individual Time Calculation (min): 60 min    Short Term Goals: Week 2:  OT Short Term Goal 1 (Week 2): Pt will be able to sit to stand to RW with mod A of 1 and hold stand for 30 sec in prep for LB self care. OT Short Term Goal 2 (Week 2): Pt will be able to use R hand to wash L arm with min A. OT Short Term Goal 3 (Week 2): Pt will bathing tasks with min A with sit<>stand from W/C or TTB OT Short Term Goal 4 (Week 2): Pt will complete LB dressing tasks with mod A sit<>stand to pull pants over hips  Skilled Therapeutic Interventions/Progress Updates:    Pt resting in bed upon arrival and agreeable to getting OOB. Supine>sit EOB with supervision. OT intervention with focus on dressing tasks, including donning soft cervical collar, donning pants, and donning socks. Pt required min A to tighten soft collar after pt donned. Pt able to thread BLE into pants and pull over hips with CGA for standing balance. Pt instructed in use of sock aide to assist with donning socks. Pt required more then a reasonable amount of time (20 mins) to don Lt sock with sock aide. Pt able to don Rt sock without use of sock aide but still requires more then a reasonable amount of time. Squat pivot transfer to w/c with supervision. Pt remained in w/c with all needs within reach and seat alarm activated.   Therapy Documentation Precautions:  Precautions Precautions: Fall, Cervical Required Braces or Orthoses: Cervical Brace Cervical Brace: Soft collar Restrictions Weight Bearing Restrictions: No Other Position/Activity Restrictions: per MD orders, collar can be removed for shower, in bed and getting in the bathroom   Pain: Pt c/o 7/10 shoulder/back pain; repositioned and meds requested   Therapy/Group: Individual Therapy  [name] 08/17/2021, 9:06 AM

## 2021-08-18 MED ORDER — GABAPENTIN 300 MG PO CAPS
600.0000 mg | ORAL_CAPSULE | Freq: Two times a day (BID) | ORAL | Status: DC
  Administered 2021-08-18 – 2021-08-24 (×12): 600 mg via ORAL
  Filled 2021-08-18 (×12): qty 2

## 2021-08-18 NOTE — Progress Notes (Signed)
Physical Therapy Session Note  Patient Details  Name: RAQUON MILLEDGE MRN: [id number] Date of Birth: 29-May-1951  Today's Date: 08/18/2021 PT Individual Time: 1430-1540 PT Individual Time Calculation (min): 70 min   Short Term Goals: Week 3:  PT Short Term Goal 1 (Week 3): =LTG due to ELOSpresented in bed sleeping but easily aroused and agreeable to therapy. Pt states some pain in neck/shoulders but did not rate. Rest breaks provided as needed. Pt performed bed mobility supine to sit with supervision and use of bed features. Performed squat pivot to w/c with supervision. Pt transported to dayroom for energy conservation. Performed squat pivot close supervision to NuStep and performed x 5 min at L2 with all 4 extremities for general conditioning then x 5 min L2 BLE only for strengthening. Pt returned to w/c in same manner as prior and propelled to high/low mat in dayroom. Performed stand pivot with RW and CGA. Pt participated in toe taps to target on level ground  2 x 10 with seated rest between bouts then an additional bout with RLE only. A 5lb weight was placed on RLE only for increased biofeedback. With fatigue pt noted to increase L lateral lean to compensate attempting to move RLE. Pt then participated in x 2 rounds of horseshoes in sitting for seated dynamic balance and forced use of RUE. Due to fatigue pt returned to w/c via squat pivot and transported back to room. In room pt performed squat pivot with supervision to bed and performed sit to supine with supervision, use of bed rail and increased time. Pt repositioned to comfort and left with bed alarm on, call bell within reach and needs met.      Therapy Documentation Precautions:  Precautions Precautions: Fall, Cervical Required Braces or Orthoses: Cervical Brace Cervical Brace: Soft collar Restrictions Weight Bearing Restrictions: No Other Position/Activity Restrictions: per MD orders, collar can be removed for shower, in bed and getting  in the bathroom General:   Vital Signs: Therapy Vitals Temp: 97.8 F (36.6 C) Pulse Rate: (!) 59 Resp: 18 BP: (!) 136/58 Patient Position (if appropriate): Sitting Oxygen Therapy SpO2: 96 %   Therapy/Group: Individual Therapy  Nasra Counce Junie Avilla, PTA  08/18/2021, 3:49 PM

## 2021-08-18 NOTE — Progress Notes (Signed)
PROGRESS NOTE   Subjective/Complaints:  Pt reports had episode last night- hands were so numb couldn't do for self.  Thinks due to lite gait yesterday- Bowels working 1 hour after bowel program- pretty consistent.  Had a cath of 1300cc- thinks 87 cc was incorrect yesterday- bladder scan.     ROS:   Pt denies SOB, abd pain, CP, N/V/C/D, and vision changes   Objective:   No results found. Recent Labs    08/16/21 0607  WBC 4.3  HGB 10.4*  HCT 31.5*  PLT 114*     Recent Labs    08/16/21 0607  NA 134*  K 4.3  CL 100  CO2 27  GLUCOSE 99  BUN 18  CREATININE 1.08  CALCIUM 8.6*      Intake/Output Summary (Last 24 hours) at 08/18/2021 1155 Last data filed at 08/18/2021 1130 Gross per 24 hour  Intake 720 ml  Output 2200 ml  Net -1480 ml     Pressure Injury 08/10/21 Buttocks Left;Right Stage 2 -  Partial thickness loss of dermis presenting as a shallow open injury with a red, pink wound bed without slough. progressed from stage 1 to stage 2. (Active)  08/10/21 1658  Location: Buttocks  Location Orientation: Left;Right  Staging: Stage 2 -  Partial thickness loss of dermis presenting as a shallow open injury with a red, pink wound bed without slough.  Wound Description (Comments): progressed from stage 1 to stage 2.  Present on Admission:     Physical Exam: Vital Signs Blood pressure 115/64, pulse (!) 57, temperature 97.8 F (36.6 C), temperature source Oral, resp. rate 18, height 5\' 11"  (1.803 m), weight 102.9 kg, SpO2 97 %.       General: awake, alert, appropriate, about to get into shower with OTA; NAD HENT: conjugate gaze; oropharynx moist CV: regular rate; no JVD Pulmonary: CTA B/L; no W/R/R- good air movement GI: soft, NT, ND, (+)BS Psychiatric: appropriate Neurological: Ox3 -  Musc: No edema in extremities.  No tenderness in extremities. Neuro: Alert Motor: RUE- 4+/5 proximally- grip  and FA 4-/5 LUE- 5/5 except 4+ in grip and FA    Assessment/Plan: 1. Functional deficits which require 3+ hours per day of interdisciplinary therapy in a comprehensive inpatient rehab setting. Physiatrist is providing close team supervision and 24 hour management of active medical problems listed below. Physiatrist and rehab team continue to assess barriers to discharge/monitor patient progress toward functional and medical goals  Care Tool:  Bathing    Body parts bathed by patient: Right arm, Left arm, Chest, Abdomen, Front perineal area, Right upper leg, Left upper leg, Right lower leg, Left lower leg, Face   Body parts bathed by helper: Buttocks Body parts n/a: Left arm, Buttocks, Right lower leg, Left lower leg   Bathing assist Assist Level: Minimal Assistance - Patient > 75%     Upper Body Dressing/Undressing Upper body dressing   What is the patient wearing?: Pull over shirt, Orthosis    Upper body assist Assist Level: Minimal Assistance - Patient > 75%    Lower Body Dressing/Undressing Lower body dressing      What is the patient wearing?: Incontinence brief, Pants  Lower body assist Assist for lower body dressing: Moderate Assistance - Patient 50 - 74%     Toileting Toileting    Toileting assist Assist for toileting: Total Assistance - Patient < 25%     Transfers Chair/bed transfer  Transfers assist     Chair/bed transfer assist level: Supervision/Verbal cueing (squat pivot)     Locomotion Ambulation   Ambulation assist      Assist level: Dependent - Patient 0% Assistive device: Lite Gait Max distance: 25'   Walk 10 feet activity   Assist  Walk 10 feet activity did not occur: Safety/medical concerns  Assist level: Dependent - Patient 0% Assistive device: Lite Gait   Walk 50 feet activity   Assist Walk 50 feet with 2 turns activity did not occur: Safety/medical concerns         Walk 150 feet activity   Assist Walk 150 feet  activity did not occur: Safety/medical concerns         Walk 10 feet on uneven surface  activity   Assist Walk 10 feet on uneven surfaces activity did not occur: Safety/medical concerns         Wheelchair     Assist Is the patient using a wheelchair?: Yes Type of Wheelchair: Manual    Wheelchair assist level: Supervision/Verbal cueing Max wheelchair distance: 100'    Wheelchair 50 feet with 2 turns activity    Assist        Assist Level: Supervision/Verbal cueing   Wheelchair 150 feet activity     Assist      Assist Level: Moderate Assistance - Patient 50 - 74%   Blood pressure 115/64, pulse (!) 57, temperature 97.8 F (36.6 C), temperature source Oral, resp. rate 18, height 5\' 11"  (1.803 m), weight 102.9 kg, SpO2 97 %.  Medical Problem List and Plan: 1.  Cervical myelopathy secondary to epidural hematoma status post epidural hematoma 07/22/2021 after ACDF 07/19/2021.  Cervical collar as directed             con't PT/OT/CIR_ d/w OTA trying to teach pt to try mirror to do bowel program.   2.  Antithrombotics: -DVT/anticoagulation:  Mechanical: Sequential compression devices, below knee Bilateral lower extremities.    10/5- since had epidural hematoma, don't want to start Lovenox without d/w NSU- Doppler s (-)             -antiplatelet therapy: N/A 3. Pain Management: Robaxin/oxycodone as needed -will add Gabapentin 300 mg BID for now.   con't Skelaxin and  Gabapentin- not taking Oxy much  10/17- pain controlled- con't regimen 10/19- had gabapentin help numbness- will increase to 600 mg BID 4. Mood: Celexa 20 mg daily             -antipsychotic agents: N/A 5. Neuropsych: This patient is capable of making decisions on his own behalf. 6. Skin/Wound Care: Routine skin checks. Foam dressings to buttocks 7. Fluids/Electrolytes/Nutrition: Routine in and outs 8.  Hypertension.  Lopressor 50 mg twice daily  Reduced lisinopril to 5 mg daily since Cr is up,  decreased 2.5 on 10/16  Reduced Demadex to 20 mg daily  Controlled on 10/15  10/17- pt's Cr down to 1.08 and BUN 18- doing much better- and BP still controlled- con't regimen  10/18- BP 108/72- con't regimen 9.  BPH with urinary retention,likely neurogenic bladder.  Flomax 0.8 mg daily.    Cont I/O caths to q4 hours 10/17- having episodes of overflow- asked him to reduce amount of fluid  to 2L/day- use gum, hard candies, etc.  10/19- doing better- con't regimen  10.  Hyperlipidemia.  Crestor 11.  Hypothyroidism.  Synthroid 12.  CAD with CABG 2019.  Aspirin currently on hold 13.  Constipation/neurogenic bowel.   On Fibercon Senna only 1 tab daily.  14.  GERD.  Protonix 15. Azotemia  Creatinine 1.27 on 10/14, labs ordered for Monday  Encourage fluids  10/17- Cr 1.08 and BUN 18 doing better- reduce slightly due to cath volumes  10/18- pt limited intake so much- went over again with pt to tell him that he has to drink 2Liters/day- just not 3L/day.  16.  Mild hyponatremia  Sodium 134 on 10/14, may have some improvement with decrease in Demadex  LOS: 17 days A FACE TO [place] 08/18/2021, 11:55 AM

## 2021-08-18 NOTE — Progress Notes (Signed)
Physical Therapy Session Note  Patient Details  Name: [name] MRN: [id number] Date of Birth: [birth year]  Today's Date: 08/18/2021 PT Individual Time: 1000-1100 PT Individual Time Calculation (min): 60 min   Short Term Goals: Week 3:  PT Short Term Goal 1 (Week 3): =LTG due to ELOS  Skilled Therapeutic Interventions/Progress Updates:    Pt received seated in w/c in room, agreeable to PT session. No complaints of pain. Pt's wife [name] present for first half of therapy session and able to participate in family education. Demonstration of how to safely assist pt with car transfer in/out of similar height vehicle to pt's [place] that he will d/c home in with use of RW and min A for balance. Pt's wife able to perform return demo of transfer and exhibits good safety awareness and ability to assist patient. Manual w/c propulsion x 50 ft with use of BUE at Supervision level before onset of fatigue. Test for manual w/c seating evaluation: manual w/c propulsion x 30 ft with use of BUE x 24.2 with 18 strokes required to cover distance. Pt left seated in w/c in room with needs in reach at end of session.  Therapy Documentation Precautions:  Precautions Precautions: Fall, Cervical Required Braces or Orthoses: Cervical Brace Cervical Brace: Soft collar Restrictions Weight Bearing Restrictions: No Other Position/Activity Restrictions: per MD orders, collar can be removed for shower, in bed and getting in the bathroom     Therapy/Group: Individual Therapy   Excell Seltzer, PT, DPT, CSRS  08/18/2021, 12:34 PM

## 2021-08-18 NOTE — Progress Notes (Signed)
Occupational Therapy Session Note  Patient Details  Name: [name] MRN: [id number] Date of Birth: 09/23/1951  Today's Date: 08/18/2021 OT Individual Time: 0100-7121 OT Individual Time Calculation (min): 75 min    Short Term Goals: Week 3:  OT Short Term Goal 1 (Week 3): STG=LTG 2/2 ELOS (continue working towards min A overll goals)  Skilled Therapeutic Interventions/Progress Updates:    OT intervention with focus on bathing at shower level, dressing with sit<>stand from w/c, discharge planning, and safety awareness. Pt's wife present for latter part of session. Squat pivot tranfsers with supervsion. Bathing with supervision seated on TTB. Pt able to bathe buttocks with lateral leans. Pt able to thread BLE into pants with supervision. Pt required CGA for standing balance when pulling pants over hips. Pt able to don socks without assistance this morning. Recommended pt not perform shower transfer at home until [facility] has made recommendations and practiced with pt. Pt able to reach buttocks while seated and simulate cleaning after BM. Pt remained seated in w/c with seat alarm activated. Wife present.   Therapy Documentation Precautions:  Precautions Precautions: Fall, Cervical Required Braces or Orthoses: Cervical Brace Cervical Brace: Soft collar Restrictions Weight Bearing Restrictions: No Other Position/Activity Restrictions: per MD orders, collar can be removed for shower, in bed and getting in the bathroom General:   Vital Signs:  Pain: Pain Assessment Pain Score: 0-No pain ADL: ADL Eating: Minimal assistance Grooming: Moderate assistance Upper Body Bathing: Moderate assistance Where Assessed-Upper Body Bathing: Wheelchair Lower Body Bathing: Moderate assistance Where Assessed-Lower Body Bathing: Wheelchair Upper Body Dressing: Maximal assistance Where Assessed-Upper Body Dressing: Wheelchair Lower Body Dressing: Dependent Where Assessed-Lower Body Dressing:  Wheelchair Toileting: Dependent Where Assessed-Toileting: Bedside Commode Toilet Transfer: Maximal assistance Toilet Transfer Method: Other (comment) (STEDY) Vision   Perception    Praxis   Exercises:   Other Treatments:     Therapy/Group: Individual Therapy  [name] 08/18/2021, 11:36 AM

## 2021-08-19 NOTE — Progress Notes (Signed)
Patient ID: [name], male   DOB: 04-03-1951, 70 y.o.   MRN: [id number]  Pt preferred [place].   SW sent HHPT/OT/aide referral to [facility] and waiting on follow-up.  *referral accepted. Likely to add SN for I/O cath or foley catheter.   Loralee Pacas, MSW, [place] Office: [phone] Cell: [phone] Fax: [phone]

## 2021-08-19 NOTE — Progress Notes (Signed)
Recreational Therapy Session Note  Patient Details  Name: [name] MRN: [id number] Date of Birth: 08-18-1951 Today's Date: 08/19/2021  Pain: no c/o Skilled Therapeutic Interventions/Progress Updates: Session focused on pt/family education.  Discussed activity analysis and potential modifications as well as potential leisure activities for therapeutic benefits once home.  All stated understanding.  Pt is looking forward to upcoming discharge.  Therapy/Group: Individual Therapy  Aislyn Hayse 08/19/2021, 4:22 PM

## 2021-08-19 NOTE — Progress Notes (Signed)
PROGRESS NOTE   Subjective/Complaints:  Had another episode where actual cath 800-900cc but bladder scanner was reading low- will change caths to q4 hours and d/w nursing to cath regardless.   Pt and OTA are working on teaching pt to do bowel program.  Asked for muscle relaxant- was told didn't have order- actually does- informed nursing Metaxalone is his medicine.   Per pt and OTA, still very ataxic- esp with RLE  ROS:   Pt denies SOB, abd pain, CP, N/V/C/D, and vision changes   Objective:   No results found. No results for input(s): WBC, HGB, HCT, PLT in the last 72 hours.    No results for input(s): NA, K, CL, CO2, GLUCOSE, BUN, CREATININE, CALCIUM in the last 72 hours.     Intake/Output Summary (Last 24 hours) at 08/19/2021 0848 Last data filed at 08/18/2021 2230 Gross per 24 hour  Intake 480 ml  Output 3100 ml  Net -2620 ml     Pressure Injury 08/10/21 Buttocks Left;Right Stage 2 -  Partial thickness loss of dermis presenting as a shallow open injury with a red, pink wound bed without slough. progressed from stage 1 to stage 2. (Active)  08/10/21 1658  Location: Buttocks  Location Orientation: Left;Right  Staging: Stage 2 -  Partial thickness loss of dermis presenting as a shallow open injury with a red, pink wound bed without slough.  Wound Description (Comments): progressed from stage 1 to stage 2.  Present on Admission:     Physical Exam: Vital Signs Blood pressure 115/60, pulse 61, temperature 98.6 F (37 C), temperature source Oral, resp. rate 17, height 5\' 11"  (1.803 m), weight 105.2 kg, SpO2 96 %.        General: awake, alert, appropriate, sitting up in bed; with OTA in room; NAD HENT: conjugate gaze; oropharynx moist CV: regular rate; no JVD Pulmonary: CTA B/L; no W/R/R- good air movement GI: soft, NT, ND, (+)BS Psychiatric: appropriate Neurological: Ox3  Musc: tight in shoulders-  upper traps/levators B/L  Neuro: Alert- decreased sensation from C4 to S5 B/L  Motor: RUE- 4+/5 proximally- grip and FA 4-/5 LUE- 5/5 except 4+ in grip and FA    Assessment/Plan: 1. Functional deficits which require 3+ hours per day of interdisciplinary therapy in a comprehensive inpatient rehab setting. Physiatrist is providing close team supervision and 24 hour management of active medical problems listed below. Physiatrist and rehab team continue to assess barriers to discharge/monitor patient progress toward functional and medical goals  Care Tool:  Bathing    Body parts bathed by patient: Right arm, Left arm, Chest, Abdomen, Front perineal area, Right upper leg, Left upper leg, Right lower leg, Left lower leg, Face   Body parts bathed by helper: Buttocks Body parts n/a: Left arm, Buttocks, Right lower leg, Left lower leg   Bathing assist Assist Level: Minimal Assistance - Patient > 75%     Upper Body Dressing/Undressing Upper body dressing   What is the patient wearing?: Pull over shirt, Orthosis    Upper body assist Assist Level: Minimal Assistance - Patient > 75%    Lower Body Dressing/Undressing Lower body dressing      What is  the patient wearing?: Incontinence brief, Pants     Lower body assist Assist for lower body dressing: Moderate Assistance - Patient 50 - 74%     Toileting Toileting    Toileting assist Assist for toileting: Total Assistance - Patient < 25%     Transfers Chair/bed transfer  Transfers assist     Chair/bed transfer assist level: Minimal Assistance - Patient > 75% (stand pivot with RW)     Locomotion Ambulation   Ambulation assist      Assist level: Dependent - Patient 0% Assistive device: Lite Gait Max distance: 25'   Walk 10 feet activity   Assist  Walk 10 feet activity did not occur: Safety/medical concerns  Assist level: Dependent - Patient 0% Assistive device: Lite Gait   Walk 50 feet activity   Assist Walk  50 feet with 2 turns activity did not occur: Safety/medical concerns         Walk 150 feet activity   Assist Walk 150 feet activity did not occur: Safety/medical concerns         Walk 10 feet on uneven surface  activity   Assist Walk 10 feet on uneven surfaces activity did not occur: Safety/medical concerns         Wheelchair     Assist Is the patient using a wheelchair?: Yes Type of Wheelchair: Manual    Wheelchair assist level: Supervision/Verbal cueing Max wheelchair distance: 38'    Wheelchair 50 feet with 2 turns activity    Assist        Assist Level: Supervision/Verbal cueing   Wheelchair 150 feet activity     Assist      Assist Level: Moderate Assistance - Patient 50 - 74%   Blood pressure 115/60, pulse 61, temperature 98.6 F (37 C), temperature source Oral, resp. rate 17, height 5\' 11"  (1.803 m), weight 105.2 kg, SpO2 96 %.  Medical Problem List and Plan: 1.  Cervical myelopathy secondary to epidural hematoma status post epidural hematoma 07/22/2021 after ACDF 07/19/2021.  Cervical collar as directed             con't PT/OT/CIR- pt learning/working on bowel program this AM with OTA. W/C evaluation today- will determine best ultra light weight w/c for pt.  2.  Antithrombotics: -DVT/anticoagulation:  Mechanical: Sequential compression devices, below knee Bilateral lower extremities.    10/5- since had epidural hematoma, don't want to start Lovenox without d/w NSU- Doppler s (-)             -antiplatelet therapy: N/A 3. Pain Management: Robaxin/oxycodone as needed -will add Gabapentin 300 mg BID for now.   con't Skelaxin and  Gabapentin- not taking Oxy much  10/17- pain controlled- con't regimen 10/19- had gabapentin help numbness- will increase to 600 mg BID 4. Mood: Celexa 20 mg daily             -antipsychotic agents: N/A 5. Neuropsych: This patient is capable of making decisions on his own behalf. 6. Skin/Wound Care: Routine skin  checks. Foam dressings to buttocks 7. Fluids/Electrolytes/Nutrition: Routine in and outs 8.  Hypertension.  Lopressor 50 mg twice daily  Reduced lisinopril to 5 mg daily since Cr is up, decreased 2.5 on 10/16  Reduced Demadex to 20 mg daily  Controlled on 10/15  10/17- pt's Cr down to 1.08 and BUN 18- doing much better- and BP still controlled- con't regimen  10/18- BP 108/72- con't regimen 9.  BPH with urinary retention,likely neurogenic bladder.  Flomax 0.8 mg  daily.    Cont I/O caths to q4 hours 10/17- having episodes of overflow- asked him to reduce amount of fluid to 2L/day- use gum, hard candies, etc.  10/20- will change orders to cath q4 hours regardless- since having difficulties with reading from bladder scanner and won't have one at home.  10.  Hyperlipidemia.  Crestor 11.  Hypothyroidism.  Synthroid 12.  CAD with CABG 2019.  Aspirin currently on hold 13.  Constipation/neurogenic bowel.   On Fibercon Senna only 1 tab daily.  10/20- working on bowel program so hopefully pt can do himself- still having good results.  14.  GERD.  Protonix 15. Azotemia  Creatinine 1.27 on 10/14, labs ordered for Monday  Encourage fluids  10/17- Cr 1.08 and BUN 18 doing better- reduce slightly due to cath volumes  10/18- pt limited intake so much- went over again with pt to tell him that he has to drink 2Liters/day- just not 3L/day.  16.  Mild hyponatremia  Sodium 134 on 10/14, may have some improvement with decrease in Demadex  LOS: 18 days A FACE TO FACE EVALUATION WAS PERFORMED  [name] 08/19/2021, 8:48 AM

## 2021-08-19 NOTE — Progress Notes (Signed)
Occupational Therapy Session Note  Patient Details  Name: [name] MRN: [id number] Date of Birth: Dec 04, 1950  Today's Date: 08/19/2021 OT Individual Time: 0700-0800 OT Individual Time Calculation (min): 60 min    Short Term Goals: Week 3:  OT Short Term Goal 1 (Week 3): STG=LTG 2/2 ELOS (continue working towards min A overll goals)  Skilled Therapeutic Interventions/Progress Updates:    Pt resting in bed upon arrival. Initial focus on simulating pt performing bowel program. Emphasis on pt's ability to reach to buttocks and grasp an object similar to suppository. Pt able to complete simulated task. Will discuss with nursing staff about pt participating in bowel program. Focus shifted to UB/LB dressing tasks. Pt able to thread BLE into pants and pull over hips with min A. Pt requires assistance donning soft collar. Pt tranfserred to w/c and remained seated in w/c with seat alarm activated. All needs within reach.   Therapy Documentation Precautions:  Precautions Precautions: Fall, Cervical Required Braces or Orthoses: Cervical Brace Cervical Brace: Soft collar Restrictions Weight Bearing Restrictions: No Other Position/Activity Restrictions: per MD orders, collar can be removed for shower, in bed and getting in the bathroom   Pain:  Pt c/o 4/10 generalized pain; pt requested meds from RN   Therapy/Group: Individual Therapy  [name] 08/19/2021, 9:02 AM

## 2021-08-19 NOTE — Progress Notes (Signed)
Occupational Therapy Session Note  Patient Details  Name: EXODUS KUTZER MRN: [id number] Date of Birth: Oct 26, 1951  Today's Date: 08/19/2021 OT Individual Time: 1300-1345 OT Individual Time Calculation (min): 45 min    Short Term Goals: Week 3:  OT Short Term Goal 1 (Week 3): STG=LTG 2/2 ELOS (continue working towards min A overll goals)  Skilled Therapeutic Interventions/Progress Updates:    Pt resting in bed upon arrival. Wife and daughter present for education. Pt practiced bed<>w/c squat pivot transfers, bed<>BSC tranfsers, and sit<>stand. Recommended DABSC to be used at bedside for bowel program. Recommendation placed in chart for CSW. Practiced TTB transfers. Recommended use of TTB if using for tub transfer. Recommended HHOT practice shower transfers before pt attempted to use shower at home. Pt and family pleased with progress. Pt remained seated EOB with family present.   Therapy Documentation Precautions:  Precautions Precautions: Fall, Cervical Required Braces or Orthoses: Cervical Brace Cervical Brace: Soft collar Restrictions Weight Bearing Restrictions: No Other Position/Activity Restrictions: per MD orders, collar can be removed for shower, in bed and getting in the bathroom   Pain: Pain Assessment Pain Score: 0-No pain   Therapy/Group: Individual Therapy  [name] 08/19/2021, 2:48 PM

## 2021-08-19 NOTE — Progress Notes (Signed)
Physical Therapy Session Note  Patient Details  Name: [name] MRN: [id number] Date of Birth: 1951-01-04  Today's Date: 08/19/2021 PT Individual Time: 1350-1500 PT Individual Time Calculation (min): 70 min   Short Term Goals: Week 3:  PT Short Term Goal 1 (Week 3): =LTG due to ELOS  Skilled Therapeutic Interventions/Progress Updates: Pt presented at EOB with family present agreeable to therapy. Pt states some mild pain un-ratable and no intervention requested. PTA discussed with family current functional level and recommended transfers at this time is squat pivot due to current deficits. Discussed that at some times a stand pivot may be needed and to only be performed with second person present at MinA/CGA level. Also disucssed that loaner chair will be delivered tomorrow and will practice on level and uneven surfaces as well as other w/c management as able prior to d/c. Pt and family in agreement. Pt performed squat pivot transfer to w/c with supervision and demonstrating fair safety awareness. Pt then transported to rehab gym and pt with PTA performed stand pivot transfer to high/low mat. PTA explained that pt continues to demonstrate ataxia R>L and ambulation not functional at this time. Pt and wife was able to performed stand pivot x 3 between mat and w/c with wife providing appropriate cues and pt able to perform with CGA/MinA all trials. Pt then transferred to parallel bars and pt ambulated 19f x 4 forwards/backwards in parallel bars. Pt required wood block between feet to increase BOS. Pt performed x 2 with 5lb cuff and 2 without to demonstrate that pt continues to have proprioceptive deficits that increase difficulty with ambulation. Family verbalized understanding. Pt transported back to room at end of session and performed squat pivot transfer to return to bed. Pt was supervision sit to supine and PTA blocked feet to allow [facility] Pt left in bed at end of session with  bed alarm on, call bell within reach and needs met.      Therapy Documentation Precautions:  Precautions Precautions: Fall, Cervical Required Braces or Orthoses: Cervical Brace Cervical Brace: Soft collar Restrictions Weight Bearing Restrictions: No Other Position/Activity Restrictions: per MD orders, collar can be removed for shower, in bed and getting in the bathroom General:   Vital Signs:   Pain:   Mobility:   Locomotion :    Trunk/Postural Assessment :    Balance:   Exercises:   Other Treatments:      Therapy/Group: Individual Therapy  Jalah [place] 08/19/2021, 4:27 PM

## 2021-08-19 NOTE — Progress Notes (Signed)
Physical Therapy Session Note  Patient Details  Name: [name] MRN: [id number] Date of Birth: [birth year]  Today's Date: 08/19/2021 PT Individual Time: 0905-1004 PT Individual Time Calculation (min): 59 min   Short Term Goals: Week 3:  PT Short Term Goal 1 (Week 3): =LTG due to ELOS  Skilled Therapeutic Interventions/Progress Updates:    Pt received sitting in w/c with his wife and daughter present and pt agreeable to therapy session. Pt wearing soft collar throughout session. Brandon, ATP from [employer] present for custom wheelchair consultation.  Discussed pt with the following wheelchair needs:  - K5 ultra-lightweight to allow adjustable wheel placement for improved UE positioning during propulsion and increased endurance with propulsion for community mobility at mod-I level - hybrid gel and foam cushion to promote increased pressure relief of sacral region due to current stage 2 wound - tension adjustable contoured back support to allow compensation for excessive spine curvature (posterior pelvic rotation with thoracic kyphosis) - wheel catching mechanism to prevent w/c from rolling backwards while propelling up a ramp - flip back armrests to allow more independent squat pivot transfers  Pt/family in agreement with these recommendations.  Pt performed ~270ft total B UE w/c propulsion to ortho gym requiring 5x seated rest breaks due to B UE fatigue and significantly decreased propulsion speed in this standard wheelchair. Demonstrating patient's need for a custom K5 ultra-lightweight wheelchair to allow modified independent functional mobility in the community.  Performed B UE w/c propulsion up ramp with mod assist to safely navigate all 4 wheels up ramp threshold then min/mod assist to prevent w/c from rolling backwards between push strokes demonstrating pt's need for wheel locking mechanism with this tasks. Attempted 2nd time with pt continuing to require significant assistance to  complete task.  Transported back to room and left seated in w/c with needs in reach and family present.    Therapy Documentation Precautions:  Precautions Precautions: Fall, Cervical Required Braces or Orthoses: Cervical Brace Cervical Brace: Soft collar Restrictions Weight Bearing Restrictions: No Other Position/Activity Restrictions: per MD orders, collar can be removed for shower, in bed and getting in the bathroom   Pain: Reports generalized pain in B UEs and neck but unrated, nursing staff notified - this does not limit pt's participation in therapy session.    Therapy/Group: Individual Therapy  Tawana Scale , PT, DPT, NCS, CSRS 08/19/2021, 7:54 AM

## 2021-08-20 DIAGNOSIS — G959 Disease of spinal cord, unspecified: Secondary | ICD-10-CM

## 2021-08-20 DIAGNOSIS — D696 Thrombocytopenia, unspecified: Secondary | ICD-10-CM

## 2021-08-20 MED ORDER — ENOXAPARIN SODIUM 60 MG/0.6ML IJ SOSY
50.0000 mg | PREFILLED_SYRINGE | INTRAMUSCULAR | Status: DC
  Administered 2021-08-20 – 2021-08-23 (×4): 50 mg via SUBCUTANEOUS
  Filled 2021-08-20 (×4): qty 0.6

## 2021-08-20 NOTE — Progress Notes (Signed)
Occupational Therapy Session Note  Patient Details  Name: MAYCO WALROND MRN: [id number] Date of Birth: 14-Dec-1950  Today's Date: 08/20/2021 OT Individual Time: 1330-1400 OT Individual Time Calculation (min): 30 min    Short Term Goals: Week 3:  OT Short Term Goal 1 (Week 3): STG=LTG 2/2 ELOS (continue working towards min A overll goals)  Skilled Therapeutic Interventions/Progress Updates:    Pt resting in w/c upon arrival. OT intervention with focus on w/c mobility, w/c setup, functional transfers, safety awareness, and activity tolerance to increase independence and prepare for discharge home. Loaner w/c had been delivered and pt pleased with ease of mobility/operation. Pt propelled w/c to gym on 4th floor. Pt has tendency to propel to fast to safely maneuver. Educated pt on safety in w/c. Pt navigated all turns safely. Theraband placed on Rt rim to assist pt with functional grasp to propel. Pt returned to room and performed squat pivot transfer to bed with supervision. Pt remained in bed with bed alarm activated. All needs within reach.   Therapy Documentation Precautions:  Precautions Precautions: Fall, Cervical Required Braces or Orthoses: Cervical Brace Cervical Brace: Soft collar Restrictions Weight Bearing Restrictions: No Other Position/Activity Restrictions: per MD orders, collar can be removed for shower, in bed and getting in the bathroom  Pain:  Pt denies pain this afternoon   Therapy/Group: Individual Therapy  [name] 08/20/2021, 2:12 PM

## 2021-08-20 NOTE — Progress Notes (Signed)
Pt is s/p revision of ACDF. Ok to start Lovenox for DVT prophylaxis per Dr. Posey Pronto.   Wt 107kg Scr 1.08 CrCl ~80 ml/min Hgb ~10s Plt 114  Rx will monitor peripherally   Onnie Boer, PharmD, BCIDP, AAHIVP, CPP Infectious Disease Pharmacist 08/20/2021 2:16 PM

## 2021-08-20 NOTE — Progress Notes (Signed)
Patient ID: [name], male   DOB: 01-20-1951, 70 y.o.   MRN: [id number]  SW called patient wife [name], and her dtr Manuela Schwartz answered. SW informed on [facility] accepting referral. SW discussed DME recs: RW and DABSC. SW to order items. SW informed will f/u if there are any further updates.   Loralee Pacas, MSW, [place] Office: [phone] Cell: [phone] Fax: [phone]

## 2021-08-20 NOTE — Progress Notes (Signed)
Recreational Therapy Discharge Summary Patient Details  Name: Yoniel C Honor MRN: [id number] Date of Birth: 01/29/1951 Today's Date: 08/20/2021  Long term goals set: 1  Long term goals met: 1  Comments on progress toward goals: Pt has made great progress during LOS and is excited about discharge home although he states he would love to stay on CIR for more therapy.  TR sessions focused on leisure education, activity analysis, identification of potential modifications, UE use/coordination and standing balance.  Pt is Mod I given extra time to complete simple tasks seated.  Pt requires min-mod assist for dynamic standing balance for simple TR task with 1 UE support.  Education provided to pt and family on potential leisure activities for therapeutic benefit once home.   Reasons for discharge: discharge from hospital  Patient/family agrees with progress made and goals achieved: Yes  SIMPSON,LISA 08/20/2021, 12:33 PM   

## 2021-08-20 NOTE — Progress Notes (Signed)
Recreational Therapy Session Note  Patient Details  Name: TRACKER MANCE MRN: [id number] Date of Birth: 1951/01/25 Today's Date: 08/20/2021  Pain: c/o 6/10, premedicated Skilled Therapeutic Interventions/Progress Updates: Session focused on activity tolerance, ambulation using RW, dynamic standing balance and discharge planning.  Pt performed bed mobility with supervision, donned shoes with assistance only for tying laces seated EOB, squat pivot transfers with supervision..  Pt ambulated using RW in [facility] x3 with min assist.  Pt stood with 1UE support for modified volleyball task tapping a ball with LRT with min assist.  Pt with c/o of R knee buckling with ambulation and volleyball task, needing seated rest breaks.  Therapy/Group: Co-Treatment  Kendry Pfarr 08/20/2021, 12:25 PM

## 2021-08-20 NOTE — Progress Notes (Signed)
Physical Therapy Session Note  Patient Details  Name: ADEWALE PUCILLO MRN: [id number] Date of Birth: 03-29-1951  Today's Date: 08/20/2021 PT Individual Time: 0905-1005 PT Individual Time Calculation (min): 60 min   Short Term Goals: Week 3:  PT Short Term Goal 1 (Week 3): =LTG due to ELOS  Skilled Therapeutic Interventions/Progress Updates: Pt presented at EOB with Lattie Haw, RT present agreeable to therapy. Pt states pain 6/10 with nsg arriving to administer meds. Pt then performed squat pivot transfer to w/c with close supervision. Pt transported to rehab gym for time management and energy conservation. Performed squat pivot to high/low mat with close supervision and set up in maxi sky harness. Pt performed Sit to stand with minA and participated in gait training with maxi sky support. Pt noted to have decreased ataxia this session and was able to ambulate ~43f x3 with modA. Pt demonstrated improved posture with multimodal cues and noted decreased BOS with fatigue. Pt did require increased seated rests between bouts. Pt also participated in ball taps in standing with noted improved standing balance (decreased lateral lean) with activity. Pt transported back to room at end of session and remained in w/c. Pt left in w/c with belt alarm on, call bell within reach and needs met.     Therapy Documentation Precautions:  Precautions Precautions: Fall, Cervical Required Braces or Orthoses: Cervical Brace Cervical Brace: Soft collar Restrictions Weight Bearing Restrictions: No Other Position/Activity Restrictions: per MD orders, collar can be removed for shower, in bed and getting in the bathroom General:   Vital Signs: Therapy Vitals Temp: (!) 97.5 F (36.4 C) Temp Source: Oral Pulse Rate: 64 Resp: 17 BP: (!) 114/54 Patient Position (if appropriate): Lying Oxygen Therapy SpO2: 99 % O2 Device: Room Air Pain: Pain Assessment Pain Scale: 0-10 Pain Score: 7  Pain Type: Acute pain Pain  Location: Shoulder Pain Orientation: Right;Anterior Pain Descriptors / Indicators: Aching Pain Frequency: Constant Pain Onset: On-going Pain Intervention(s): Medication (See eMAR)    Therapy/Group: Individual Therapy  Ryman Rathgeber Merrill Villarruel, PTA  08/20/2021, 4:04 PM

## 2021-08-20 NOTE — Progress Notes (Signed)
Occupational Therapy Session Note  Patient Details  Name: [name] MRN: [id number] Date of Birth: 03-10-1951  Today's Date: 08/20/2021 OT Individual Time: 0700-0825 OT Individual Time Calculation (min): 85 min    Short Term Goals: Week 3:  OT Short Term Goal 1 (Week 3): STG=LTG 2/2 ELOS (continue working towards min A overll goals)  Skilled Therapeutic Interventions/Progress Updates:    OT intervention with focus on functional transfers, bathing at shower level, dressing with sit<>stand, w/c mobility, and making adaptation to anti-claw splint. Bathing at shower level with min A. Squat pivot tranfsers with supervision. Sit<>stand with RW for pulling pants over hips with min A. Pt able to pull pants over hips without assistance. Pt requires assistance donning soft cervical collar. Pt able to don socks without assistance. Pt propelled w/c to ortho gym for splint adaptations. Mole skin added to inside splint to improve fit and prevent splint from falling of pt's hand with activity. Pt propelled w/c back to room. Pt remained in w/c with belt alarm activated and all needs within reach.   Therapy Documentation Precautions:  Precautions Precautions: Fall, Cervical Required Braces or Orthoses: Cervical Brace Cervical Brace: Soft collar Restrictions Weight Bearing Restrictions: No Other Position/Activity Restrictions: per MD orders, collar can be removed for shower, in bed and getting in the bathroom  Pain: Pt requested pain meds during session 2/2 increase pain with activity.    Therapy/Group: Individual Therapy  [name] 08/20/2021, 8:35 AM

## 2021-08-20 NOTE — Progress Notes (Signed)
Bowel program continued with patient consent. Patient assisted to the bathroom; digital stimulation done while sitting on the commode. Formed and mushy stool, medium amount out. Peri care done.

## 2021-08-20 NOTE — Progress Notes (Signed)
Occupational Therapy Session Note  Patient Details  Name: [name] MRN: [id number] Date of Birth: 1951-09-17  Today's Date: 08/20/2021 OT Individual Time: 1130-1158 OT Individual Time Calculation (min): 28 min    Short Term Goals: Week 3:  OT Short Term Goal 1 (Week 3): STG=LTG 2/2 ELOS (continue working towards min A overll goals)  Skilled Therapeutic Interventions/Progress Updates:  Pt greeted seated in w/c asleep but easily able to arouse  and agreeable to OT intervention. Session focus on seated therapeutic activities focused on [facility] and finger isolation to facilitate independence with ADL tasks. Pt worked on "pop it" with emphasis on finger isolation of pointer finger. Pt instrutced to utilize pointer finger only to pop each button on square game.  Additionally worked on bilateral integration/FMC task of Economist. Education provided on using RUE as stabilizer and LUE as "working hand" d/t decreased sensation in RUE. Pt with good carryover of education and able to complete task with supervision.  pt left  seated in w/c with all needs within reach and alarm belt activated.                     Therapy Documentation Precautions:  Precautions Precautions: Fall, Cervical Required Braces or Orthoses: Cervical Brace Cervical Brace: Soft collar Restrictions Weight Bearing Restrictions: No Other Position/Activity Restrictions: per MD orders, collar can be removed for shower, in bed and getting in the bathroom   Pain:   Pt reports no pain during session    Therapy/Group: Individual Therapy  Precious Haws 08/20/2021, 12:16 PM

## 2021-08-20 NOTE — Progress Notes (Signed)
Occupational Therapy Session Note  Patient Details  Name: [name] MRN: [id number] Date of Birth: 09/15/1951  Today's Date: 08/21/2021 OT Group Time: 1100-1200 OT Group Time Calculation (min): 60 min   Skilled Therapeutic Interventions/Progress Updates:    Pt engaged in therapeutic w/c level dance group focusing on patient choice, UE/LE strengthening, salience, activity tolerance, and social participation. Pt was guided through various dance-based exercises involving UEs/LEs and trunk. All music was selected by group members. Emphasis placed on UB/LB NMR. Pt with excellent participation during group, really worked on UE coordination due to clonus in the [place]. At end of session pt was escorted back to his room by OT.    Therapy Documentation Precautions:  Precautions Precautions: Fall, Cervical Required Braces or Orthoses: Cervical Brace Cervical Brace: Soft collar Restrictions Weight Bearing Restrictions: No Other Position/Activity Restrictions: per MD orders, collar can be removed for shower, in bed and getting in the bathroom  Pain: no s/s pain during tx Pain Assessment Pain Scale: 0-10 Pain Score: 6  Faces Pain Scale: No hurt Pain Type: Acute pain Pain Location: Neck Pain Orientation: Posterior Pain Descriptors / Indicators: Sore Pain Frequency: Intermittent Pain Onset: On-going Pain Intervention(s): Medication (See eMAR)    Therapy/Group: Group Therapy  Kirsi Hugh A Sanela Evola 08/21/2021, 12:49 PM

## 2021-08-20 NOTE — Progress Notes (Signed)
PROGRESS NOTE   Subjective/Complaints: Patient seen sitting up in his chair this morning.  He states he slept well overnight.  He notes his bladder feels better with more frequent caths.  He notes he was able to ambulate more than he had been previously today.  ROS: Denies CP, SOB, N/V/D  Objective:   No results found. No results for input(s): WBC, HGB, HCT, PLT in the last 72 hours.    No results for input(s): NA, K, CL, CO2, GLUCOSE, BUN, CREATININE, CALCIUM in the last 72 hours.     Intake/Output Summary (Last 24 hours) at 08/20/2021 1244 Last data filed at 08/20/2021 0900 Gross per 24 hour  Intake 600 ml  Output 1701 ml  Net -1101 ml          Physical Exam: Vital Signs Blood pressure (!) 127/51, pulse 73, temperature 98.7 F (37.1 C), temperature source Oral, resp. rate 17, height 5\' 11"  (1.803 m), weight 106.8 kg, SpO2 95 %. Constitutional: No distress . Vital signs reviewed. HENT: Normocephalic.  Atraumatic. Eyes: EOMI. No discharge. Cardiovascular: No JVD.  RRR. Respiratory: Normal effort.  No stridor.  Bilateral clear to auscultation. GI: Non-distended.  BS +. Skin: Warm and dry.  Intact. Psych: Normal mood.  Normal behavior. Musc: No edema in extremities.  No tenderness in extremities. Neuro: Alert Neuro:  Diminished sensation distal to C4 Motor: RUE- 4+/5 proximally- grip and hand grip 4-/5, unchanged LUE- 5/5 except 4+ in grip and FA    Assessment/Plan: 1. Functional deficits which require 3+ hours per day of interdisciplinary therapy in a comprehensive inpatient rehab setting. Physiatrist is providing close team supervision and 24 hour management of active medical problems listed below. Physiatrist and rehab team continue to assess barriers to discharge/monitor patient progress toward functional and medical goals  Care Tool:  Bathing    Body parts bathed by patient: Right arm, Left arm,  Chest, Abdomen, Front perineal area, Right upper leg, Left upper leg, Right lower leg, Left lower leg, Face   Body parts bathed by helper: Buttocks Body parts n/a: Left arm, Buttocks, Right lower leg, Left lower leg   Bathing assist Assist Level: Minimal Assistance - Patient > 75%     Upper Body Dressing/Undressing Upper body dressing   What is the patient wearing?: Pull over shirt, Orthosis Orthosis activity level: Performed by helper  Upper body assist Assist Level: Minimal Assistance - Patient > 75%    Lower Body Dressing/Undressing Lower body dressing      What is the patient wearing?: Incontinence brief, Pants     Lower body assist Assist for lower body dressing: Moderate Assistance - Patient 50 - 74%     Toileting Toileting    Toileting assist Assist for toileting: Total Assistance - Patient < 25%     Transfers Chair/bed transfer  Transfers assist     Chair/bed transfer assist level: Minimal Assistance - Patient > 75% (stand pivot with RW)     Locomotion Ambulation   Ambulation assist      Assist level: Dependent - Patient 0% Assistive device: Lite Gait Max distance: 25'   Walk 10 feet activity   Assist  Walk 10 feet  activity did not occur: Safety/medical concerns  Assist level: Dependent - Patient 0% Assistive device: Lite Gait   Walk 50 feet activity   Assist Walk 50 feet with 2 turns activity did not occur: Safety/medical concerns         Walk 150 feet activity   Assist Walk 150 feet activity did not occur: Safety/medical concerns         Walk 10 feet on uneven surface  activity   Assist Walk 10 feet on uneven surfaces activity did not occur: Safety/medical concerns         Wheelchair     Assist Is the patient using a wheelchair?: Yes Type of Wheelchair: Manual    Wheelchair assist level: Supervision/Verbal cueing Max wheelchair distance: 67'    Wheelchair 50 feet with 2 turns activity    Assist         Assist Level: Supervision/Verbal cueing   Wheelchair 150 feet activity     Assist      Assist Level: Moderate Assistance - Patient 50 - 74%   Blood pressure (!) 127/51, pulse 73, temperature 98.7 F (37.1 C), temperature source Oral, resp. rate 17, height 5\' 11"  (1.803 m), weight 106.8 kg, SpO2 95 %.  Medical Problem List and Plan: 1.  Cervical myelopathy secondary to epidural hematoma status post epidural hematoma 07/22/2021 after ACDF 07/19/2021.  Cervical collar as directed  Continue CIR 2.  Antithrombotics: -DVT/anticoagulation:  Mechanical: Sequential compression devices, below knee Bilateral lower extremities.    Lovenox initiated             -antiplatelet therapy: N/A 3. Pain Management: Robaxin/oxycodone as needed -Added Gabapentin 300 mg BID for now.   con't Skelaxin and  Gabapentin- not taking Oxy much  Increased gabapentin to 600 mg BID  Controlled with meds on 10/21 4. Mood: Celexa 20 mg daily             -antipsychotic agents: N/A 5. Neuropsych: This patient is capable of making decisions on his own behalf. 6. Skin/Wound Care: Routine skin checks. Foam dressings to buttocks 7. Fluids/Electrolytes/Nutrition: Routine in and outs 8.  Hypertension.  Lopressor 50 mg twice daily  Reduced lisinopril to 5 mg daily since Cr is up, decreased 2.5 on 10/16  Reduced Demadex to 20 mg daily  Controlled on 10/21 9.  BPH with urinary retention,likely neurogenic bladder.  Flomax 0.8 mg daily.    Cont I/O caths to q4 hours 10.  Hyperlipidemia.  Crestor 11.  Hypothyroidism.  Synthroid 12.  CAD with CABG 2019.  Aspirin currently on hold 13.  Constipation/neurogenic bowel.   On Fibercon Senna only 1 tab daily.  Improving 14.  GERD.  Protonix 15. Azotemia  Creatinine 1.08 on 10/17  Encourage fluids 16.  Mild hyponatremia  Sodium 134 on 10/17, may have some improvement with decrease in Demadex 17.  Thrombocytopenia  Platelets 114 on 10/17, labs ordered for  Monday  LOS: 19 days A FACE TO FACE EVALUATION WAS PERFORMED  Mashonda Broski Lorie Phenix 08/20/2021, 12:44 PM

## 2021-08-21 MED ORDER — CHLORHEXIDINE GLUCONATE CLOTH 2 % EX PADS: 6.0000 | MEDICATED_PAD | Freq: Two times a day (BID) | CUTANEOUS | Status: DC

## 2021-08-21 MED ORDER — CHLORHEXIDINE GLUCONATE CLOTH 2 % EX PADS
6.0000 | MEDICATED_PAD | Freq: Two times a day (BID) | CUTANEOUS | Status: DC
  Administered 2021-08-22 – 2021-08-24 (×5): 6 via TOPICAL

## 2021-08-21 NOTE — Progress Notes (Signed)
PROGRESS NOTE   Subjective/Complaints: No new complaints Asks if foley catheter can be inserted today so he may sleep well- currently being awoken every 4 hours for caths.  Eating a salad for lunch  ROS: Denies CP, SOB, N/V/D, +urinary retention  Objective:   No results found. No results for input(s): WBC, HGB, HCT, PLT in the last 72 hours.    No results for input(s): NA, K, CL, CO2, GLUCOSE, BUN, CREATININE, CALCIUM in the last 72 hours.     Intake/Output Summary (Last 24 hours) at 08/21/2021 1644 Last data filed at 08/21/2021 1400 Gross per 24 hour  Intake 596 ml  Output 2660 ml  Net -2064 ml         Physical Exam: Vital Signs Blood pressure (!) 119/55, pulse 72, temperature 98.4 F (36.9 C), resp. rate 18, height 5\' 11"  (1.803 m), weight 102.8 kg, SpO2 100 %. Gen: no distress, normal appearing HEENT: oral mucosa pink and moist, NCAT Cardio: Reg rate Chest: normal effort, normal rate of breathing Abd: soft, non-distended Ext: no edema Psych: Normal mood.  Normal behavior. Musc: No edema in extremities.  No tenderness in extremities. Neuro: Alert Neuro:  Diminished sensation distal to C4 Motor: RUE- 4+/5 proximally- grip and hand grip 4-/5, unchanged LUE- 5/5 except 4+ in grip and FA    Assessment/Plan: 1. Functional deficits which require 3+ hours per day of interdisciplinary therapy in a comprehensive inpatient rehab setting. Physiatrist is providing close team supervision and 24 hour management of active medical problems listed below. Physiatrist and rehab team continue to assess barriers to discharge/monitor patient progress toward functional and medical goals  Care Tool:  Bathing    Body parts bathed by patient: Right arm, Left arm, Chest, Abdomen, Front perineal area, Right upper leg, Left upper leg, Right lower leg, Left lower leg, Face   Body parts bathed by helper: Buttocks Body parts  n/a: Left arm, Buttocks, Right lower leg, Left lower leg   Bathing assist Assist Level: Minimal Assistance - Patient > 75%     Upper Body Dressing/Undressing Upper body dressing   What is the patient wearing?: Pull over shirt, Orthosis Orthosis activity level: Performed by helper  Upper body assist Assist Level: Minimal Assistance - Patient > 75%    Lower Body Dressing/Undressing Lower body dressing      What is the patient wearing?: Incontinence brief, Pants     Lower body assist Assist for lower body dressing: Moderate Assistance - Patient 50 - 74%     Toileting Toileting    Toileting assist Assist for toileting: Total Assistance - Patient < 25%     Transfers Chair/bed transfer  Transfers assist     Chair/bed transfer assist level: Minimal Assistance - Patient > 75% (stand pivot with RW)     Locomotion Ambulation   Ambulation assist      Assist level: Dependent - Patient 0% Assistive device: Lite Gait Max distance: 25'   Walk 10 feet activity   Assist  Walk 10 feet activity did not occur: Safety/medical concerns  Assist level: Dependent - Patient 0% Assistive device: Lite Gait   Walk 50 feet activity   Assist Walk 50  feet with 2 turns activity did not occur: Safety/medical concerns         Walk 150 feet activity   Assist Walk 150 feet activity did not occur: Safety/medical concerns         Walk 10 feet on uneven surface  activity   Assist Walk 10 feet on uneven surfaces activity did not occur: Safety/medical concerns         Wheelchair     Assist Is the patient using a wheelchair?: Yes Type of Wheelchair: Manual    Wheelchair assist level: Supervision/Verbal cueing Max wheelchair distance: 1'    Wheelchair 50 feet with 2 turns activity    Assist        Assist Level: Supervision/Verbal cueing   Wheelchair 150 feet activity     Assist      Assist Level: Moderate Assistance - Patient 50 - 74%    Blood pressure (!) 119/55, pulse 72, temperature 98.4 F (36.9 C), resp. rate 18, height 5\' 11"  (1.803 m), weight 102.8 kg, SpO2 100 %.  Medical Problem List and Plan: 1.  Cervical myelopathy secondary to epidural hematoma status post epidural hematoma 07/22/2021 after ACDF 07/19/2021.  Cervical collar as directed  Continue CIR 2.  Antithrombotics: -DVT/anticoagulation:  Mechanical: Sequential compression devices, below knee Bilateral lower extremities.    Lovenox initiated             -antiplatelet therapy: N/A 3. Pain Management: Robaxin/oxycodone as needed -Added Gabapentin 300 mg BID for now.   con't Skelaxin and  Gabapentin- not taking Oxy much  Continue gabapentin to 600 mg BID 4. Depression: Continue Celexa 20 mg daily             -antipsychotic agents: N/A 5. Neuropsych: This patient is capable of making decisions on his own behalf. 6. Skin/Wound Care: Routine skin checks. Foam dressings to buttocks 7. Fluids/Electrolytes/Nutrition: Routine in and outs 8.  Hypertension.  Lopressor 50 mg twice daily  Reduced lisinopril to 5 mg daily since Cr is up, decreased 2.5 on 10/16  Reduced Demadex to 20 mg daily  Controlled on 10/21 9.  BPH with urinary retention,likely neurogenic bladder.  Flomax 0.8 mg daily.    Insert foley 10.  Hyperlipidemia.  Crestor 11.  Hypothyroidism.  Synthroid 12.  CAD with CABG 2019.  Aspirin currently on hold 13.  Constipation/neurogenic bowel.   On Fibercon Senna only 1 tab daily.  Improving 14.  GERD.  Protonix 15. Azotemia  Creatinine 1.08 on 10/17  Encourage fluids 16.  Mild hyponatremia  Sodium 134 on 10/17, may have some improvement with decrease in Demadex 17.  Thrombocytopenia  Platelets 114 on 10/17, labs ordered for Monday  LOS: 20 days A FACE TO [place] 08/21/2021, 4:44 PM

## 2021-08-21 NOTE — Progress Notes (Signed)
Patient called to use bathroom. Patient had a continent bowel movement, medium-sized formed stool. Digital stimulation done afterwards and no more stool came out. Peri care done.

## 2021-08-22 NOTE — Progress Notes (Signed)
Occupational Therapy Session Note  Patient Details  Name: [name] MRN: [id number] Date of Birth: 1950/12/25  Today's Date: 08/22/2021 OT Individual Time: 4097-3532 and 9924-2683 OT Individual Time Calculation (min): 72 min  and 44 min   Short Term Goals: Week 1:  OT Short Term Goal 1 (Week 1): Pt will be able to sit to stand to RW with mod A of 1 and hold stand for 30 sec in prep for LB self care. OT Short Term Goal 1 - Progress (Week 1): Progressing toward goal OT Short Term Goal 2 (Week 1): Pt will demonstrate improved UE strength to don tshirt with min A OT Short Term Goal 2 - Progress (Week 1): Met OT Short Term Goal 3 (Week 1): Pt will be able to don shorts over feet with mod A. OT Short Term Goal 3 - Progress (Week 1): Met OT Short Term Goal 4 (Week 1): Pt will be able to use R hand to wash L arm with min A. OT Short Term Goal 4 - Progress (Week 1): Progressing toward goal Week 2:  OT Short Term Goal 1 (Week 2): Pt will be able to sit to stand to RW with mod A of 1 and hold stand for 30 sec in prep for LB self care. OT Short Term Goal 1 - Progress (Week 2): Met OT Short Term Goal 2 (Week 2): Pt will be able to use R hand to wash L arm with min A. OT Short Term Goal 2 - Progress (Week 2): Met OT Short Term Goal 3 (Week 2): Pt will bathing tasks with min A with sit<>stand from W/C or TTB OT Short Term Goal 3 - Progress (Week 2): Met OT Short Term Goal 4 (Week 2): Pt will complete LB dressing tasks with mod A sit<>stand to pull pants over hips OT Short Term Goal 4 - Progress (Week 2): Met Week 3:  OT Short Term Goal 1 (Week 3): STG=LTG 2/2 ELOS (continue working towards min A overll goals)   Skilled Therapeutic Interventions/Progress Updates:    Session 1: Pt greeted at time of session in bed semireclined, no pain, eager for therapy session. Supine > sit Supervision and donned shorts sitting EOB with Min A to fully get over RLE and sit <> stand to don over hips at RW.  Therapist assist threading Foley and educated pt on how to do so prior to threading. Squat pivot > wheelchair Supervision and self propel to 4th floor ortho gym with Supervision and extended time, using theraband on R wheel to assist. Adjusted strap on anticlaw splint to make wider and help prevent sliding off hand, pt stating he liked new strap better. Remainder of session focused on RUE NMR with FMC/GMC activities including building blocks, in hand manipulation and translation with poker chips, and crossing midline for bean bags + toss. Note tasks adjusted and adapted as needed. Discussion throughout session regarding DC planning home with wife, bowel/bladder program, future cath to be placed, etc. Pt up in chair alarm on call bell in reach.    Session 2: Pt greeted at time of session sitting up in wheelchair finished eating lunch, agreeable to OT session and no pain reported. Self propel room > elevator with Supervision and extended time. Transported remaining distance to ortho gym and OT revised R hand splint at digits 4-5 to flare out to decrease pressure point. Pt verbalizing this felt good and noted improvement. Pt tolerated wearing splint today for 2 hours. In gym,  focused on BUE NMR at rebounder 1x20 throws lightweight ball, switched to small weighted 3kg ball and 1x20 throws, 1x20 throw +chest press, and 1x20 supination/pronation for hand strengthening. Pt performing [facility] activity for removing clothespin clips colors yellow and red with tripod pinch from clothing to improve R hand coordination and functional use. Back in room, call bell in reach all needs met.    Therapy Documentation Precautions:  Precautions Precautions: Fall, Cervical Required Braces or Orthoses: Cervical Brace Cervical Brace: Soft collar Restrictions Weight Bearing Restrictions: No Other Position/Activity Restrictions: per MD orders, collar can be removed for shower, in bed and getting in the  bathroom     Therapy/Group: Individual Therapy  Viona Gilmore 08/22/2021, 7:17 AM

## 2021-08-22 NOTE — Progress Notes (Signed)
Occupational Therapy Session Note  Patient Details  Name: [name] MRN: [id number] Date of Birth: 12/23/1950  Today's Date: 08/23/2021 OT Individual Time: 1300-1414 OT Individual Time Calculation (min): 74 min   Skilled Therapeutic Interventions/Progress Updates:    Pt greeted in bed and premedicated for pain. ADL needs met. Setup for supine<sit and supervision for squat pivot<w/c. Pt was very motivated to go outdoors during session. First worked on sit<stands and standing endurance using the RW for support. Min A for standing balance. Worked on UB strengthening/NMR via self propelling the w/c over uneven concrete. He reported that he already has HEPs from primary therapists, feels ready for home. We discussed psychosocial benefits of joining an SCI support group. At end of session pt was returned to the room and completed another squat pivot<bed. He remained in bed at close of session, all needs within reach and bed alarm set.   Therapy Documentation Precautions:  Precautions Precautions: Fall, Cervical Required Braces or Orthoses: Cervical Brace Cervical Brace: Soft collar Restrictions Weight Bearing Restrictions: No Other Position/Activity Restrictions: per MD orders, collar can be removed for shower, in bed and getting in the bathroom  Pain: Pain Assessment Pain Score: 0-No pain ADL: ADL Eating: Minimal assistance Grooming: Moderate assistance Upper Body Bathing: Moderate assistance Where Assessed-Upper Body Bathing: Wheelchair Lower Body Bathing: Moderate assistance Where Assessed-Lower Body Bathing: Wheelchair Upper Body Dressing: Maximal assistance Where Assessed-Upper Body Dressing: Wheelchair Lower Body Dressing: Dependent Where Assessed-Lower Body Dressing: Wheelchair Toileting: Dependent Where Assessed-Toileting: Bedside Commode Toilet Transfer: Maximal assistance Toilet Transfer Method: Other (comment) (STEDY)   Therapy/Group: Individual Therapy  Michaela  A Hoffman 08/23/2021, 3:43 PM 

## 2021-08-22 NOTE — Discharge Summary (Signed)
Physician Discharge Summary  Patient ID: [name] MRN: [id number] DOB/AGE: 12-22-1950 70 y.o.  Admit date: 08/01/2021 Discharge date: 08/24/2021  Discharge Diagnoses:  Principal Problem:   Acute incomplete quadriplegia ([place]) Active Problems:   Epidural hematoma   Pressure injury of skin   Hyponatremia   Neurogenic bladder   Cervical myelopathy (HCC) CVA prophylaxis Hypertension BPH Hyperlipidemia Hypothyroidism CAD with CABG GERD ITP  Discharged Condition: Stable  Significant Diagnostic Studies: VAS [place] LOWER EXTREMITY VENOUS (DVT)  Result Date: 08/02/2021  Lower Venous DVT Study Patient Name:  [name]  Date of Exam:   08/02/2021 Medical Rec #: [id number]      Accession #:    [id number] Date of Birth: [birth year]      Patient Gender: M Patient Age:   42 years Exam Location:  [facility] Procedure:      VAS [place] LOWER EXTREMITY VENOUS (DVT) Referring Phys: Lauraine Rinne --------------------------------------------------------------------------------  Indications: Swelling.  Risk Factors: Surgery 07-22-2021 Anterior cervical decompression/discectomy revision fusion w/ evacuation of epidural hematoma. Comparison       12-15-2016 Prior left lower extremity venous was negative for Study:           DVT. Performing Technologist: Darlin Coco RDMS, RVT  Examination Guidelines: A complete evaluation includes B-mode imaging, spectral Doppler, color Doppler, and power Doppler as needed of all accessible portions of each vessel. Bilateral testing is considered an integral part of a complete examination. Limited examinations for reoccurring indications may be performed as noted. The reflux portion of the exam is performed with the patient in reverse Trendelenburg.  +---------+---------------+---------+-----------+----------+--------------+ RIGHT    CompressibilityPhasicitySpontaneityPropertiesThrombus Aging  +---------+---------------+---------+-----------+----------+--------------+ CFV      Full           Yes      Yes                                 +---------+---------------+---------+-----------+----------+--------------+ SFJ      Full                                                        +---------+---------------+---------+-----------+----------+--------------+ FV Prox  Full                                                        +---------+---------------+---------+-----------+----------+--------------+ FV Mid   Full                                                        +---------+---------------+---------+-----------+----------+--------------+ FV DistalFull                                                        +---------+---------------+---------+-----------+----------+--------------+ PFV      Full                                                        +---------+---------------+---------+-----------+----------+--------------+  POP      Full           Yes      Yes                                 +---------+---------------+---------+-----------+----------+--------------+ PTV      Full                                                        +---------+---------------+---------+-----------+----------+--------------+ PERO     Full                                                        +---------+---------------+---------+-----------+----------+--------------+   +---------+---------------+---------+-----------+----------+--------------+ LEFT     CompressibilityPhasicitySpontaneityPropertiesThrombus Aging +---------+---------------+---------+-----------+----------+--------------+ CFV      Full           Yes      Yes                                 +---------+---------------+---------+-----------+----------+--------------+ SFJ      Full                                                         +---------+---------------+---------+-----------+----------+--------------+ FV Prox  Full                                                        +---------+---------------+---------+-----------+----------+--------------+ FV Mid   Full                                                        +---------+---------------+---------+-----------+----------+--------------+ FV DistalFull                                                        +---------+---------------+---------+-----------+----------+--------------+ PFV      Full                                                        +---------+---------------+---------+-----------+----------+--------------+ POP      Full           Yes      Yes                                 +---------+---------------+---------+-----------+----------+--------------+  PTV      Full                                                        +---------+---------------+---------+-----------+----------+--------------+ PERO     Full                                                        +---------+---------------+---------+-----------+----------+--------------+     Summary: RIGHT: - There is no evidence of deep vein thrombosis in the lower extremity.  - No cystic structure found in the popliteal fossa.  LEFT: - There is no evidence of deep vein thrombosis in the lower extremity.  - No cystic structure found in the popliteal fossa.  *See table(s) above for measurements and observations. Electronically signed by Servando Snare MD on 08/02/2021 at 4:54:37 PM.    Final     Labs:  Basic Metabolic Panel: Recent Labs  Lab 08/23/21 0833  NA 139  K 4.3  CL 101  CO2 31  GLUCOSE 106*  BUN 13  CREATININE 1.08  CALCIUM 8.8*    CBC: Recent Labs  Lab 08/23/21 0833  WBC 4.5  NEUTROABS 3.0  HGB 10.3*  HCT 32.5*  MCV 95.6  PLT 126*    CBG: No results for input(s): GLUCAP in the last 168 hours.  Family history.  Father with CAD.  Maternal  grandmother with colon cancer.  Negative for esophageal cancer or rectal cancer or stomach cancer  Brief HPI:   [name] is a 70 y.o. right-handed male with history of ITP, hypertension, CAD with CABG 2019 hyperlipidemia.  Per chart review lives with spouse independent with assistive device.  Patient with recent admission C3-4 ACDF by 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  He was discharged home 07/20/2021 ambulating 400 feet supervision without assistive device.  Presented 07/22/2021 with numbness tingling lightening sensation and weakness going down his arms and legs while taking a shower.  Work-up and imaging showed epidural hematoma at C3-4 with cord compression.  Underwent revision arthrodesis C3-4 anterior interbody technique evacuation of epidural hematoma placement of intervertebral biomechanical device 07/22/2021 per [name].  Patient continue with cervical brace.  Therapy evaluations completed due to patient decreased functional mobility was admitted for a comprehensive rehab program.   Hospital Course: [name] was admitted to rehab 08/01/2021 for inpatient therapies to consist of PT, ST and OT at least three hours five days a week. Past admission physiatrist, therapy team and rehab RN have worked together to provide customized collaborative inpatient rehab.  Pertain to patient's epidural hematoma cervical myelopathy status post epidural hematoma evacuation ACDF 07/19/2021.  Cervical collar as directed.  Patient would follow-up neurosurgery.  Initially with SCDs for DVT prophylaxis Lovenox added for DVT prophylaxis.  Vascular ultrasound negative.  Pain managed use of Robaxin oxycodone as needed maintained on Neurontin for neuropathic pain.  Mood stabilization with Celexa emotional support provided and follow-up per neurosurgery.  Blood pressure controlled and monitored on Lopressor with low-dose Demadex as well as lisinopril.  BPH/neurogenic  bladder urinary retention maintained on Flomax Foley tube inserted.  Crestor ongoing for hyperlipidemia.  Synthroid  for hyperlipidemia.  He did have a history of CAD with CABG he can return to his aspirin therapy at the discretion of neurosurgery.  Mild thrombocytopenia platelets 114,000 and monitored no bleeding episodes.   Blood pressures were monitored on TID basis and controlled     Rehab course: During patient's stay in rehab weekly team conferences were held to monitor patient's progress, set goals and discuss barriers to discharge. At admission, patient required moderate assist to feet 4 wheeled walker moderate assist sit to supine minimal assist sit to side-lying  Physical exam.  Blood pressure 163/78 pulse 72 temp 97 5 respirations 18 oxygen saturation 98% room air Constitutional.  No acute distress HEENT Head.  Normocephalic and atraumatic Eyes.  Pupils round and reactive to light no discharge without nystagmus Neck.  Supple nontender no JVD without thyromegaly Cardiac regular rate rhythm no extra sounds or murmur heard Abdomen.  Soft nontender positive bowel sounds without rebound Respiratory effort normal no respiratory distress without wheeze Genitourinary Foley catheter tube in place Skin.  ACDF site clean and dry Musculoskeletal. Comments.  Right upper extremity biceps 4/5, triceps 4/5, wrist extension 4 -/5, grip 3/5, FA 2/5 Left upper extremity 4+/5 in same muscles Right lower extremity hip flexors 3 -/5 knee extension/dorsiflexion and plantar flexion 4/5 Left lower extremity 4+/5 in same muscles Neurologic.  Alert oriented no acute distress   He/She  has had improvement in activity tolerance, balance, postural control as well as ability to compensate for deficits. He/She has had improvement in functional use RUE/LUE  and RLE/LLE as well as improvement in awareness.  Perform squat pivot transfers to wheelchair with close supervision.  Perform squat pivot to high low mat  with close supervision.  Perform sit to stand minimal assist participated in gait training with Maxi sky support.  Ambulates 30 feet x 3 with moderate assist.  OT intervention with focus on wheelchair mobility wheelchair set up functional transfer safety awareness and activity tolerance to increase independence.  Propelled wheelchair to the gym.  Has tendency to propel too fast to safely maneuver.  Full family teaching completed plan discharged to home       Disposition: Discharge to home    Diet: Regular  Special Instructions: No driving smoking or alcohol  Cervical collar as directed.  Hold aspirin until further notice  Medications at discharge 1.  Tylenol as needed 2.  Dulcolax suppository daily 3.  Celexa 20 mg p.o. every morning 4.  Lovenox 50 mg daily until 11/20/2021 and stop 5.  Neurontin 600 mg p.o. twice daily 6.  Synthroid 150 mcg p.o. daily 7.  Lisinopril 2.5 mg p.o. daily 8.  Skelaxin 800 mg p.o. 4 times daily as needed muscle spasms 9.  Lopressor 50 mg p.o. twice daily 10.  Multivitamin daily 11.  Percocet 5-3 25 1  to 2 tablets every 4 hours as needed pain 12.  Protonix 40 mg p.o. daily 13.  FiberCon 625 mg p.o. daily 14.  MiraLAX daily as needed constipation 15.  Crestor 40 mg p.o. daily 16.  Flomax 0.8 mg p.o. daily 17.  Demadex 20 mg p.o. daily 18.  Trazodone 50 mg p.o. nightly 19.  Klor-Con 10 mEq daily   30-35 minutes were spent completing discharge summary and discharge planning  Discharge Instructions     Ambulatory referral to Physical Medicine Rehab   Complete by: As directed    Moderate complexity follow-up 1 to 2 weeks incomplete paraplegia   Ambulatory referral to Urology   Complete  by: As directed    Evaluate and treat cervical myelopathy with urinary retention/neurogenic bladder        Follow-up Information     Lovorn, Jinny Blossom, MD Follow up.   Specialty: Physical Medicine and Rehabilitation Why: Office to call for appointment Contact  information: [address] [place] [address] [phone]         Dawley, Pieter Partridge C, DO Follow up.   Why: Call for appointment Contact information: [address] [place] [address] [phone]                 Signed: Lavon Paganini [place] 08/24/2021, 5:05 AM

## 2021-08-22 NOTE — Progress Notes (Signed)
Physical Therapy Session Note  Patient Details  Name: [name] MRN: [id number] Date of Birth: [birth year]  Today's Date: 08/22/2021 PT Individual Time: 1440-1540 PT Individual Time Calculation (min): 60 min   Short Term Goals: Week 3:  PT Short Term Goal 1 (Week 3): =LTG due to ELOS  Skilled Therapeutic Interventions/Progress Updates:     Patient in w/c with his wife and granddaughter in the room upon PT arrival. Patient alert and agreeable to PT session. Patient denied pain during session. Patient's wife participated in hands-on training for personal car transfers, basic transfers, w/c ramp management, and w/c parts management, and general education for patient and family provided for pressure relief schedule (every 15-30 min, w/c push up or lateral lean), mobility at home w/c level with sit to stand and stand pivot transfers without fatigue. Discussed role of HHPT, progression to ambulation only with therapy at this time for safety, fall risk/prevention, home modifications to prevent falls, and activation of emergency services in the event of a fall during session. Patient and family in agreement with all education provided.  Patient reports B foot numbness and stated that he tends to "go barefoot." PT educated on risk of skin integrity and injury without shoes with impaired sensation. Educated on foot care and inspection to prevent infection. Donned B socks and shoes with total A for energy/time management.   Therapeutic Activity: Transfers: Patient performed sit to/from stand and stand pivot transfers x1 with PT and x2 with his wife assisting with CGA for steadying support using RW. Used teach-back method for hand placement and safety awareness during transfers, patient's wife provided appropriate safety cues during assist. Patient performed a simulated personal SUV car transfer x2with min A using RW, +2 on first trial due to patient externally distracted and impulsive requiring max cues  from PT and his wife for safety, and one person assist from his wife on second trial. Provided safety education between trials and cued patient to talk through each step of the transfer to improve focus and pacing. Provided demonstration and cues for safe technique, and sitting at the edge then using arms to scoot hip back in the seat before bringing his legs in. Spent increased time discussing w/c and RW set-up for safety due to narrow space between the car and the car door. Patient and his wife reported feeling confident performing transfer after second trial.  Gait Training:  Patient ambulated 30 feet using RW with L hand splint with min-mod A and +2 for w/c follow for safety. Ambulated with decreased gait speed, decreased step length and height, mild knee hyperextension in stance L>R, forward trunk lean, and mild downward head gaze. Provided verbal cues for looking ahead, erect posture to tolerance due to stenosis, increased step hight and control, and safe proximity to RW.  Wheelchair Mobility:  Patient propelled wheelchair >150 feet over unlevel outdoor surfaces, level tile, and on/off an elevator independently. Provided verbal cues and demonstration for donning/doffing leg rests and educated on locking breaks when sitting still int he w/c for safety. Patient able to doff and don leg rests with increased time bilaterally and increased effort on L. Patient ascended and descended a ramp with CGA for safety, ascended backwards and descended forwards using B upper and lower extremities for reduced upper extremity strain and improved control with lower extremities assisting.   Patient in w/c with his family in the room at end of session with breaks locked and all needs within reach.   Therapy Documentation Precautions:  Precautions Precautions: Fall, Cervical Required Braces or Orthoses: Cervical Brace Cervical Brace: Soft collar Restrictions Weight Bearing Restrictions: No Other  Position/Activity Restrictions: per MD orders, collar can be removed for shower, in bed and getting in the bathroom    Therapy/Group: Individual Therapy  [name] L [name] PT, DPT  08/22/2021, 3:52 PM

## 2021-08-23 LAB — BASIC METABOLIC PANEL
Anion gap: 7 (ref 5–15)
BUN: 13 mg/dL (ref 8–23)
CO2: 31 mmol/L (ref 22–32)
Calcium: 8.8 mg/dL — ABNORMAL LOW (ref 8.9–10.3)
Chloride: 101 mmol/L (ref 98–111)
Creatinine, Ser: 1.08 mg/dL (ref 0.61–1.24)
GFR, Estimated: >60 mL/min (ref >60)
Glucose, Bld: 106 mg/dL — ABNORMAL HIGH (ref 70–99)
Potassium: 4.3 mmol/L (ref 3.5–5.1)
Sodium: 139 mmol/L (ref 135–145)

## 2021-08-23 LAB — CBC WITH DIFFERENTIAL/PLATELET
Abs Immature Granulocytes: 0.04 10*3/uL (ref 0.00–0.07)
Basophils Absolute: 0 10*3/uL (ref 0.0–0.1)
Basophils Relative: 0 %
Eosinophils Absolute: 0.2 10*3/uL (ref 0.0–0.5)
Eosinophils Relative: 5 %
HCT: 32.5 % — ABNORMAL LOW (ref 39.0–52.0)
Hemoglobin: 10.3 g/dL — ABNORMAL LOW (ref 13.0–17.0)
Immature Granulocytes: 1 %
Lymphocytes Relative: 14 %
Lymphs Abs: 0.6 10*3/uL — ABNORMAL LOW (ref 0.7–4.0)
MCH: 30.3 pg (ref 26.0–34.0)
MCHC: 31.7 g/dL (ref 30.0–36.0)
MCV: 95.6 fL (ref 80.0–100.0)
Monocytes Absolute: 0.5 10*3/uL (ref 0.1–1.0)
Monocytes Relative: 12 %
Neutro Abs: 3 10*3/uL (ref 1.7–7.7)
Neutrophils Relative %: 68 %
Platelets: 126 10*3/uL — ABNORMAL LOW (ref 150–400)
RBC: 3.4 MIL/uL — ABNORMAL LOW (ref 4.22–5.81)
RDW: 12.8 % (ref 11.5–15.5)
WBC: 4.5 10*3/uL (ref 4.0–10.5)
nRBC: 0 % (ref 0.0–0.2)

## 2021-08-23 MED ORDER — TAMSULOSIN HCL 0.4 MG PO CAPS: ORAL_CAPSULE | ORAL | 0 refills | Status: DC

## 2021-08-23 MED ORDER — ALBUTEROL SULFATE HFA 108 (90 BASE) MCG/ACT IN AERS: 2.0000 | INHALATION_SPRAY | Freq: Four times a day (QID) | RESPIRATORY_TRACT | 0 refills | Status: DC | PRN

## 2021-08-23 MED ORDER — CITALOPRAM HYDROBROMIDE 20 MG PO TABS: 20.0000 mg | ORAL_TABLET | Freq: Every morning | ORAL | 1 refills | Status: DC

## 2021-08-23 MED ORDER — TORSEMIDE 20 MG PO TABS: 20.0000 mg | ORAL_TABLET | Freq: Every day | ORAL | 0 refills | Status: DC

## 2021-08-23 MED ORDER — CALCIUM POLYCARBOPHIL 625 MG PO TABS: 625.0000 mg | ORAL_TABLET | Freq: Every day | ORAL | 0 refills | Status: DC

## 2021-08-23 MED ORDER — ACETAMINOPHEN 325 MG PO TABS: 650.0000 mg | ORAL_TABLET | Freq: Four times a day (QID) | ORAL | Status: AC | PRN

## 2021-08-23 MED ORDER — LISINOPRIL 2.5 MG PO TABS: 2.5000 mg | ORAL_TABLET | Freq: Every day | ORAL | 0 refills | Status: DC

## 2021-08-23 MED ORDER — GABAPENTIN 300 MG PO CAPS: 600.0000 mg | ORAL_CAPSULE | Freq: Two times a day (BID) | ORAL | 0 refills | Status: DC

## 2021-08-23 MED ORDER — LEVOTHYROXINE SODIUM 150 MCG PO TABS: ORAL_TABLET | ORAL | 1 refills | Status: DC

## 2021-08-23 MED ORDER — OXYCODONE-ACETAMINOPHEN 5-325 MG PO TABS: 1.0000 | ORAL_TABLET | ORAL | 0 refills | Status: DC | PRN

## 2021-08-23 MED ORDER — METOPROLOL TARTRATE 50 MG PO TABS: 50.0000 mg | ORAL_TABLET | Freq: Two times a day (BID) | ORAL | 1 refills | Status: DC

## 2021-08-23 MED ORDER — ROSUVASTATIN CALCIUM 40 MG PO TABS: 40.0000 mg | ORAL_TABLET | Freq: Every day | ORAL | 0 refills | Status: DC

## 2021-08-23 MED ORDER — TRAZODONE HCL 50 MG PO TABS: 50.0000 mg | ORAL_TABLET | Freq: Every day | ORAL | 0 refills | Status: DC

## 2021-08-23 MED ORDER — PANTOPRAZOLE SODIUM 40 MG PO TBEC: 40.0000 mg | DELAYED_RELEASE_TABLET | Freq: Every day | ORAL | 0 refills | Status: DC

## 2021-08-23 MED ORDER — METAXALONE 800 MG PO TABS: 800.0000 mg | ORAL_TABLET | Freq: Four times a day (QID) | ORAL | 0 refills | Status: DC | PRN

## 2021-08-23 MED ORDER — POTASSIUM CHLORIDE CRYS ER 10 MEQ PO TBCR: 10.0000 meq | EXTENDED_RELEASE_TABLET | Freq: Every day | ORAL | 0 refills | Status: DC

## 2021-08-23 MED ORDER — ENOXAPARIN SODIUM 60 MG/0.6ML IJ SOSY
PREFILLED_SYRINGE | INTRAMUSCULAR | 1 refills | Status: DC
Start: 2021-08-23 — End: 2021-08-24

## 2021-08-23 NOTE — Plan of Care (Signed)
  Problem: RH Toilet Transfers Goal: LTG Patient will perform toilet transfers w/assist (OT) Description: LTG: Patient will perform toilet transfers with assist, with/without cues using equipment (OT) Outcome: Completed/Met   

## 2021-08-23 NOTE — Progress Notes (Signed)
Physical Therapy Discharge Summary  Patient Details  Name: [name] MRN: [id number] Date of Birth: [birth year]  Patient has met 5 of 9 long term goals due to improved activity tolerance, improved balance, improved postural control, increased strength, and ability to compensate for deficits.  Patient to discharge at a wheelchair level Modified Independent, Supervision for squat pivot transfers, and CGA for sit to stand and transfers with RW.   Patient's care partner is independent to provide the necessary physical assistance at discharge. Pt's wife and daughter have completed hands-on family education and are safe to assist pt upon d/c home.  Reasons goals not met: Pt did not meet dynamic standing balance goal of Supervision as he still requires mod A due to ongoing impaired propriocetion and ataxia in limbs, did not meet sit to stand goal of Supervision as he still requires CGA for safety, did not meet car transfer goal of Supervision as he requires min A for safety, and did not meet gait goal of 50 ft as he was only able to ambulate x 50 ft and required assist x 2 people for safety. Pt has met goals adequately for a safe d/c home with family.  Recommendation:  Patient will benefit from ongoing skilled PT services in home health setting to continue to advance safe functional mobility, address ongoing impairments in endurance, strength, coordination, safety, independence with functional mobility, balance, and minimize fall risk.  Equipment: RW and loaner wheelchair from [employer]  Reasons for discharge: treatment goals met and discharge from hospital  Patient/family agrees with progress made and goals achieved: Yes  PT Discharge Precautions/Restrictions Precautions Precautions: Fall;Cervical Required Braces or Orthoses: Cervical Brace Cervical Brace: Soft collar Restrictions Weight Bearing Restrictions: No Pain Interference Pain Interference Pain Effect on Sleep: 2. Occasionally Pain  Interference with Therapy Activities: 2. Occasionally Pain Interference with Day-to-Day Activities: 2. Occasionally Vision/Perception  Perception Perception: Within Functional Limits Praxis Praxis: Intact  Cognition Overall Cognitive Status: Within Functional Limits for tasks assessed Arousal/Alertness: Awake/alert Orientation Level: Oriented X4 Year: 2022 Attention: Sustained Sustained Attention: Appears intact Memory: Appears intact Awareness: Appears intact Problem Solving: Appears intact Safety/Judgment: Appears intact Sensation Sensation Light Touch: Impaired by gross assessment Proprioception: Impaired by gross assessment Additional Comments: impaired in B UE and LE Coordination Gross Motor Movements are Fluid and Coordinated: No Fine Motor Movements are Fluid and Coordinated: No Coordination and Movement Description: impaired 2/2 incomplete tetraplegia Motor  Motor Motor: Ataxia;Abnormal postural alignment and control Motor - Discharge Observations: impaired 2/2 incomplete tetraplegia  Mobility Bed Mobility Bed Mobility: Rolling Right;Rolling Left;Supine to Sit;Sit to Supine Rolling Right: Independent with assistive device Rolling Left: Independent with assistive device Supine to Sit: Independent with assistive device Sit to Supine: Independent with assistive device Transfers Transfers: Sit to Stand;Stand Pivot Transfers;Squat Pivot Transfers Sit to Stand: Contact Guard/Touching assist Stand to Sit: Contact Guard/Touching assist Stand Pivot Transfers: Contact Guard/Touching assist Stand Pivot Transfer Details: Tactile cues for initiation;Verbal cues for sequencing;Verbal cues for technique;Tactile cues for weight beaing;Tactile cues for weight shifting;Tactile cues for sequencing Squat Pivot Transfers: Supervision/Verbal cueing Transfer (Assistive device): Rolling walker Locomotion  Gait Ambulation: Yes Gait Assistance: 2 Helpers Gait Distance (Feet): 30  Feet Assistive device: Rolling walker Gait Assistance Details: Tactile cues for initiation;Tactile cues for posture;Verbal cues for sequencing;Verbal cues for gait pattern;Verbal cues for technique;Tactile cues for sequencing;Tactile cues for placement Gait Gait: Yes Gait Pattern: Impaired Gait Pattern: Decreased step length - right;Decreased step length - left;Decreased hip/knee flexion - right;Decreased hip/knee flexion -  left;Decreased dorsiflexion - right;Decreased dorsiflexion - left;Ataxic;Scissoring;Trunk flexed;Narrow base of support Gait velocity: decreased Stairs / Additional Locomotion Stairs: No Pick up small object from the floor (from standing position) activity did not occur: Safety/medical concerns Architect: Yes Wheelchair Assistance: Independent with Camera operator: Both upper extremities Wheelchair Parts Management: Independent Distance: 150 ft (+)  Trunk/Postural Assessment  Cervical Assessment Cervical Assessment: Exceptions to [facility] (forward head, soft cervical collar) Thoracic Assessment Thoracic Assessment: Exceptions to [facility] (rounded shoulders) Lumbar Assessment Lumbar Assessment: Exceptions to [facility] (posterior pelvic tilt) Postural Control Postural Control: Deficits on evaluation Trunk Control: Impaired due to weakness  Balance Balance Balance Assessed: Yes Static Sitting Balance Static Sitting - Balance Support: No upper extremity supported;Feet supported Static Sitting - Level of Assistance: 6: Modified independent (Device/Increase time) Dynamic Sitting Balance Dynamic Sitting - Balance Support: No upper extremity supported;Feet supported;During functional activity Dynamic Sitting - Level of Assistance: 5: Stand by assistance Static Standing Balance Static Standing - Balance Support: Bilateral upper extremity supported;During functional activity Static Standing - Level of Assistance: 4: Min  assist Dynamic Standing Balance Dynamic Standing - Balance Support: Bilateral upper extremity supported;During functional activity Dynamic Standing - Level of Assistance: 3: Mod assist Extremity Assessment   RLE Assessment RLE Assessment: Exceptions to [facility] General Strength Comments: impaired, see below RLE Strength Right Hip Flexion: 3-/5 Right Knee Flexion: 3/5 Right Knee Extension: 3+/5 Right Ankle Dorsiflexion: 4/5 Right Ankle Plantar Flexion: 3/5 LLE Assessment LLE Assessment: Exceptions to [facility] General Strength Comments: impaired, see below LLE Strength Left Hip Flexion: 4/5 Left Knee Flexion: 3/5 Left Knee Extension: 3+/5 Left Ankle Dorsiflexion: 4/5 Left Ankle Plantar Flexion: 4/5     Excell Seltzer, PT, DPT, CSRS 08/23/2021, 4:46 PM

## 2021-08-23 NOTE — Progress Notes (Signed)
Physical Therapy Session Note  Patient Details  Name: [name] MRN: [id number] Date of Birth: [birth year]  Today's Date: 08/23/2021 PT Individual Time: 0900-1000 PT Individual Time Calculation (min): 60 min   Short Term Goals: Week 3:  PT Short Term Goal 1 (Week 3): =LTG due to ELOS  Skilled Therapeutic Interventions/Progress Updates:    Pt received seated in w/c in room, agreeable to PT session. Pt reports some pain in his neck at rest, premedicated prior to start of therapy session. Manual w/c propulsion up to 150 ft at mod I level with use of BUE. Sit to stand with min A to RW during session. Ambulation x 30 ft with RW and mod A for balance with close w/c follow for safety. Pt exhibits flexed trunk, ataxic LE, and needs assist to keep RW steady and close to his body during gait for safety. Pt currently remains a non-functional ambulator at this time and should only perform ambulation with therapy, not with family upon d/c home. Pt performs ultralightweight manual w/c test, able to propel x 30 ft in 15 sec with 12 strokes as compared to 24.2 sec and 18 strokes in standard lightweight wheelchair. Provided HEP with patient (see below). Pt requests to return to bed at end of session, Supervision for squat pivot transfer. Sit to supine mod I. Pt left supine in bed with needs in reach, bed alarm in place.  Access Code: [email or web address] URL: [email or web address] Date: 08/23/2021 Prepared by: Excell Seltzer  Exercises Sit to Stand with Armchair - 1 x daily - 7 x weekly - 3 sets - 10 reps Heel Toe Raises with Counter Support - 1 x daily - 7 x weekly - 1 sets - 10 reps Mini Squat with Counter Support - 1 x daily - 7 x weekly - 3 sets - 5 reps Alternating Step Taps with Counter Support - 1 x daily - 7 x weekly - 2 sets - 10 reps Seated March - 1 x daily - 7 x weekly - 3 sets - 10 reps Seated Long Arc Quad - 1 x daily - 7 x weekly - 3 sets - 10 reps   Therapy  Documentation Precautions:  Precautions Precautions: Fall, Cervical Required Braces or Orthoses: Cervical Brace Cervical Brace: Soft collar Restrictions Weight Bearing Restrictions: No Other Position/Activity Restrictions: per MD orders, collar can be removed for shower, in bed and getting in the bathroom      Therapy/Group: Individual Therapy   Excell Seltzer, PT, DPT, CSRS  08/23/2021, 2:54 PM

## 2021-08-23 NOTE — Progress Notes (Signed)
PROGRESS NOTE   Subjective/Complaints:  Pt happier with foley- can sleep better  Walked 30 ft in RW with PT- did great!  ROS:  Pt denies SOB, abd pain, CP, N/V/C/D, and vision changes   Objective:   No results found. Recent Labs    08/23/21 0833  WBC 4.5  HGB 10.3*  HCT 32.5*  PLT 126*      Recent Labs    08/23/21 0833  NA 139  K 4.3  CL 101  CO2 31  GLUCOSE 106*  BUN 13  CREATININE 1.08  CALCIUM 8.8*       Intake/Output Summary (Last 24 hours) at 08/23/2021 1924 Last data filed at 08/23/2021 1308 Gross per 24 hour  Intake 354 ml  Output 2800 ml  Net -2446 ml         Physical Exam: Vital Signs Blood pressure 121/65, pulse 66, temperature 98.3 F (36.8 C), resp. rate 18, height 5\' 11"  (1.803 m), weight 104.8 kg, SpO2 98 %.    General: awake, alert, appropriate, in shower- bathing with OTA; NAD HENT: conjugate gaze; oropharynx moist CV: regular rate; no JVD Pulmonary: CTA B/L; no W/R/R- good air movement GI: soft, NT, ND, (+)BS Psychiatric: appropriate Neurological: Ox3  Musc: No edema in extremities.  No tenderness in extremities. Neuro: Alert Neuro:  Diminished sensation distal to C4 Motor: RUE- 4+/5 proximally- grip and hand grip 4-/5, unchanged LUE- 5/5 except 4+ in grip and FA    Assessment/Plan: 1. Functional deficits which require 3+ hours per day of interdisciplinary therapy in a comprehensive inpatient rehab setting. Physiatrist is providing close team supervision and 24 hour management of active medical problems listed below. Physiatrist and rehab team continue to assess barriers to discharge/monitor patient progress toward functional and medical goals  Care Tool:  Bathing    Body parts bathed by patient: Right arm, Left arm, Chest, Abdomen, Front perineal area, Right upper leg, Left upper leg, Right lower leg, Left lower leg, Face   Body parts bathed by helper:  Buttocks Body parts n/a: Left arm, Buttocks, Right lower leg, Left lower leg   Bathing assist Assist Level: Minimal Assistance - Patient > 75%     Upper Body Dressing/Undressing Upper body dressing   What is the patient wearing?: Pull over shirt, Orthosis Orthosis activity level: Performed by helper  Upper body assist Assist Level: Supervision/Verbal cueing    Lower Body Dressing/Undressing Lower body dressing      What is the patient wearing?: Pants     Lower body assist Assist for lower body dressing: Supervision/Verbal cueing     Toileting Toileting    Toileting assist Assist for toileting: Minimal Assistance - Patient > 75%     Transfers Chair/bed transfer  Transfers assist     Chair/bed transfer assist level: Contact Guard/Touching assist Chair/bed transfer assistive device: Programmer, multimedia   Ambulation assist      Assist level: 2 helpers Assistive device: Walker-rolling Max distance: 30 ft   Walk 10 feet activity   Assist  Walk 10 feet activity did not occur: Safety/medical concerns  Assist level: 2 helpers Assistive device: Walker-rolling   Walk 50 feet activity  Assist Walk 50 feet with 2 turns activity did not occur: Safety/medical concerns         Walk 150 feet activity   Assist Walk 150 feet activity did not occur: Safety/medical concerns         Walk 10 feet on uneven surface  activity   Assist Walk 10 feet on uneven surfaces activity did not occur: Safety/medical concerns         Wheelchair     Assist Is the patient using a wheelchair?: Yes Type of Wheelchair: Manual    Wheelchair assist level: Independent Max wheelchair distance: >150 ft    Wheelchair 50 feet with 2 turns activity    Assist        Assist Level: Independent   Wheelchair 150 feet activity     Assist      Assist Level: Independent   Blood pressure 121/65, pulse 66, temperature 98.3 F (36.8 C), resp.  rate 18, height 5\' 11"  (1.803 m), weight 104.8 kg, SpO2 98 %.  Medical Problem List and Plan: 1.  Cervical myelopathy secondary to epidural hematoma status post epidural hematoma 07/22/2021 after ACDF 07/19/2021.  Cervical collar as directed  Continue CIR Con't PT and OT-  2.  Antithrombotics: -DVT/anticoagulation:  Mechanical: Sequential compression devices, below knee Bilateral lower extremities.    Lovenox initiated  10/24- will go home on Lovenox x 3 months total              -antiplatelet therapy: N/A 3. Pain Management: Robaxin/oxycodone as needed -Added Gabapentin 300 mg BID for now.   con't Skelaxin and  Gabapentin- not taking Oxy much  Continue gabapentin to 600 mg BID 4. Depression: Continue Celexa 20 mg daily             -antipsychotic agents: N/A 5. Neuropsych: This patient is capable of making decisions on his own behalf. 6. Skin/Wound Care: Routine skin checks. Foam dressings to buttocks 7. Fluids/Electrolytes/Nutrition: Routine in and outs 8.  Hypertension.  Lopressor 50 mg twice daily  Reduced lisinopril to 5 mg daily since Cr is up, decreased 2.5 on 10/16  Reduced Demadex to 20 mg daily  Controlled on 10/21 9.  BPH with urinary retention,likely neurogenic bladder.  Flomax 0.8 mg daily.    Insert foley 10/24- will need to f/u with Urology after d/c 10.  Hyperlipidemia.  Crestor 11.  Hypothyroidism.  Synthroid 12.  CAD with CABG 2019.  Aspirin currently on hold 13.  Constipation/neurogenic bowel.   On Fibercon Senna only 1 tab daily.  Improving 14.  GERD.  Protonix 15. Azotemia  Creatinine 1.08 on 10/17  Encourage fluids 16.  Mild hyponatremia  Sodium 134 on 10/17, may have some improvement with decrease in Demadex 17.  Thrombocytopenia  Platelets 114 on 10/17, labs ordered for Monday  10/24- Plts 126- will need to monitor outpt.   LOS: 22 days A FACE TO FACE EVALUATION WAS PERFORMED  Franny Selvage 08/23/2021, 7:24 PM

## 2021-08-23 NOTE — Progress Notes (Signed)
Patient ID: [name], male   DOB: 1951/09/24, 70 y.o.   MRN: [id number]  SW ordered DABSC and RW with [employer] via parachute.   Loralee Pacas, MSW, [place] Office: [phone] Cell: [phone] Fax: [phone]

## 2021-08-23 NOTE — Progress Notes (Signed)
Occupational Therapy Discharge Summary  Patient Details  Name: [name] MRN: [id number] Date of Birth: 03/12/1951  Patient has met 13 of 13 long term goals due to improved activity tolerance, improved balance, postural control, ability to compensate for deficits, functional use of  RIGHT upper and LEFT upper extremity, and improved coordination.  Pt made excellent progress with BADLs and functional transfers during this admission. Pt requires min A for bathing, LB dressing, and toilting tasks. Squat pivot transfers with supervision. Pt uses RUE/LUE at diminished level for functional tasks. Pt's wife and daughter have participated in education/therapy sessions and provide the appropriate level of supervision/assistance. Patient to discharge at overall Min Assist level.  Patient's care partner is independent to provide the necessary assistance at discharge.    Recommendation:  Patient will benefit from ongoing skilled OT services in home health setting to continue to advance functional skills in the area of BADL and iADL.  Equipment: DABSC  Reasons for discharge: treatment goals met and discharge from hospital  Patient/family agrees with progress made and goals achieved: Yes  OT Discharge Vision Baseline Vision/History: 1 Wears glasses Patient Visual Report: No change from baseline Vision Assessment?: No apparent visual deficits Perception  Perception: Within Functional Limits Praxis Praxis: Intact Cognition Overall Cognitive Status: Within Functional Limits for tasks assessed Arousal/Alertness: Awake/alert Orientation Level: Oriented X4 Year: 2022 Month: October Day of Week: Correct Attention: Sustained Sustained Attention: Appears intact Memory: Appears intact Immediate Memory Recall: Sock;Blue;Bed Memory Recall Sock: Without Cue Memory Recall Blue: Without Cue Memory Recall Bed: Without Cue Awareness: Appears intact Problem Solving: Appears intact Safety/Judgment:  Appears intact Sensation Sensation Light Touch: Impaired by gross assessment Hot/Cold: Appears Intact Proprioception: Impaired by gross assessment Stereognosis: Impaired by gross assessment Coordination Gross Motor Movements are Fluid and Coordinated: No Fine Motor Movements are Fluid and Coordinated: No Motor  Motor Motor: Ataxia Trunk/Postural Assessment  Cervical Assessment Cervical Assessment: Exceptions to WFL (cervical collar) Thoracic Assessment Thoracic Assessment: Exceptions to WFL (rounded shoulders) Lumbar Assessment Lumbar Assessment: Exceptions to WFL (posterior pelvic tilt) Postural Control Trunk Control: Impaired due to weakness  Balance Static Sitting Balance Static Sitting - Balance Support: Feet supported Static Sitting - Level of Assistance: 6: Modified independent (Device/Increase time) Dynamic Sitting Balance Dynamic Sitting - Balance Support: During functional activity Dynamic Sitting - Level of Assistance: 5: Stand by assistance Extremity/Trunk Assessment RUE Assessment Passive Range of Motion (PROM) Comments: limited sh flex/abd due to neck pain Active Range of Motion (AROM) Comments: limited elbow extension General Strength Comments: grasp 3+/5, shoulder 2+/5, elbow flex 3+/5, elbow ext 2+/5 LUE Assessment Passive Range of Motion (PROM) Comments: WFL Active Range of Motion (AROM) Comments: full AROM but achieving AROM is not smooth or coordination General Strength Comments: sh not tested for spinal prec; elbow flex and ext 4-, grasp 4-   Lanier, Thomas Chappell 08/23/2021, 10:59 AM 

## 2021-08-23 NOTE — Progress Notes (Signed)
Occupational Therapy Session Note  Patient Details  Name: [name] MRN: [id number] Date of Birth: 08/23/1951  Today's Date: 08/23/2021 OT Individual Time: 0700-0800 OT Individual Time Calculation (min): 60 min    Short Term Goals: Week 3:  OT Short Term Goal 1 (Week 3): STG=LTG 2/2 ELOS (continue working towards min A overll goals)  Skilled Therapeutic Interventions/Progress Updates:    Pt resting in bed upon arrival and ready to get OOB. OT intervention with focus on bed mobility, squat pivot transfers, sit<>stand, standing balance, safety awareness, BADL training, and education. Squat pivot transfers with supervision. Sit<>stand with supervision. Standing balance with supervision while pulling pants over hips. Bathing with min A. LB dressing with min A. Pt pleased with progress and ready for discharge home tomorrow.   Therapy Documentation Precautions:  Precautions Precautions: Fall, Cervical Required Braces or Orthoses: Cervical Brace Cervical Brace: Soft collar Restrictions Weight Bearing Restrictions: No Other Position/Activity Restrictions: per MD orders, collar can be removed for shower, in bed and getting in the bathroom  Pain: Pt denies pain this morning   Therapy/Group: Individual Therapy  [name] 08/23/2021, 9:08 AM

## 2021-08-24 MED ORDER — ENOXAPARIN SODIUM 60 MG/0.6ML IJ SOSY
PREFILLED_SYRINGE | INTRAMUSCULAR | 2 refills | Status: DC
Start: 2021-08-24 — End: 2023-03-02

## 2021-08-24 NOTE — Progress Notes (Signed)
PROGRESS NOTE   Subjective/Complaints:  Pt reports didn't think needed to go home on Lovenox- wife scared of giving.  Went over need for lovenox x 3 months total-   Also asking when will see me and Dr Claudia Desanctis that I'm sending him to for f/u.  Have contacted Dr Claudia Desanctis about it.   ROS:    Pt denies SOB, abd pain, CP, N/V/C/D, and vision changes  Objective:   No results found. Recent Labs    08/23/21 0833  WBC 4.5  HGB 10.3*  HCT 32.5*  PLT 126*      Recent Labs    08/23/21 0833  NA 139  K 4.3  CL 101  CO2 31  GLUCOSE 106*  BUN 13  CREATININE 1.08  CALCIUM 8.8*       Intake/Output Summary (Last 24 hours) at 08/24/2021 0911 Last data filed at 08/24/2021 0700 Gross per 24 hour  Intake 417 ml  Output 2600 ml  Net -2183 ml         Physical Exam: Vital Signs Blood pressure (!) 142/68, pulse (!) 58, temperature 98.1 F (36.7 C), resp. rate 18, height 5\' 11"  (1.803 m), weight 104.6 kg, SpO2 97 %.     General: awake, alert, appropriate, sitting EOB, stood with wife using RW to get pants pulled up; NAD HENT: conjugate gaze; oropharynx moist CV: regular rate; no JVD Pulmonary: CTA B/L; no W/R/R- good air movement GI: soft, NT, ND, (+)BS Psychiatric: appropriate Neurological: Ox3  Musc: No edema in extremities.  No tenderness in extremities. Neuro: Alert Neuro:  Diminished sensation distal to C4- very decreased to light touch and pinprick from C4 to S5 B/L  Motor: RUE- 4+/5 proximally- grip and hand grip 4-/5, unchanged LUE- 5/5 except 4+ in grip and FA    Assessment/Plan: 1. Functional deficits which require 3+ hours per day of interdisciplinary therapy in a comprehensive inpatient rehab setting. Physiatrist is providing close team supervision and 24 hour management of active medical problems listed below. Physiatrist and rehab team continue to assess barriers to discharge/monitor patient  progress toward functional and medical goals  Care Tool:  Bathing    Body parts bathed by patient: Right arm, Left arm, Chest, Abdomen, Front perineal area, Right upper leg, Left upper leg, Right lower leg, Left lower leg, Face   Body parts bathed by helper: Buttocks Body parts n/a: Left arm, Buttocks, Right lower leg, Left lower leg   Bathing assist Assist Level: Minimal Assistance - Patient > 75%     Upper Body Dressing/Undressing Upper body dressing   What is the patient wearing?: Pull over shirt, Orthosis Orthosis activity level: Performed by helper  Upper body assist Assist Level: Supervision/Verbal cueing    Lower Body Dressing/Undressing Lower body dressing      What is the patient wearing?: Pants     Lower body assist Assist for lower body dressing: Supervision/Verbal cueing     Toileting Toileting    Toileting assist Assist for toileting: Minimal Assistance - Patient > 75%     Transfers Chair/bed transfer  Transfers assist     Chair/bed transfer assist level: Contact Guard/Touching assist Chair/bed transfer assistive device: Gilford Rile  Locomotion Ambulation   Ambulation assist      Assist level: 2 helpers Assistive device: Walker-rolling Max distance: 30 ft   Walk 10 feet activity   Assist  Walk 10 feet activity did not occur: Safety/medical concerns  Assist level: 2 helpers Assistive device: Walker-rolling   Walk 50 feet activity   Assist Walk 50 feet with 2 turns activity did not occur: Safety/medical concerns         Walk 150 feet activity   Assist Walk 150 feet activity did not occur: Safety/medical concerns         Walk 10 feet on uneven surface  activity   Assist Walk 10 feet on uneven surfaces activity did not occur: Safety/medical concerns         Wheelchair     Assist Is the patient using a wheelchair?: Yes Type of Wheelchair: Manual    Wheelchair assist level: Independent Max wheelchair distance:  >150 ft    Wheelchair 50 feet with 2 turns activity    Assist        Assist Level: Independent   Wheelchair 150 feet activity     Assist      Assist Level: Independent   Blood pressure (!) 142/68, pulse (!) 58, temperature 98.1 F (36.7 C), resp. rate 18, height 5\' 11"  (1.803 m), weight 104.6 kg, SpO2 97 %.  Medical Problem List and Plan: 1.  Cervical myelopathy secondary to epidural hematoma status post epidural hematoma 07/22/2021 after ACDF 07/19/2021.  Cervical collar as directed  D/c today- went over need for Lovenox x 3 months total- and then can stop- went over risk of DVT/PE and what that means.  Will con't Foley- will need H/H which he has to have foley care- will need SPC long term.  Con't PT and OT-  2.  Antithrombotics: -DVT/anticoagulation:  Mechanical: Sequential compression devices, below knee Bilateral lower extremities.    Lovenox initiated  10/24- will go home on Lovenox x 3 months total              -antiplatelet therapy: N/A 3. Pain Management: Robaxin/oxycodone as needed -Added Gabapentin 300 mg BID for now.   con't Skelaxin and  Gabapentin- not taking Oxy much  Continue gabapentin to 600 mg BID  10/25- will go home with pain meds - 7 days supply and then 30 days after that- not taking much.  4. Depression: Continue Celexa 20 mg daily             -antipsychotic agents: N/A 5. Neuropsych: This patient is capable of making decisions on his own behalf. 6. Skin/Wound Care: Routine skin checks. Foam dressings to buttocks 7. Fluids/Electrolytes/Nutrition: Routine in and outs 8.  Hypertension.  Lopressor 50 mg twice daily  Reduced lisinopril to 5 mg daily since Cr is up, decreased 2.5 on 10/16  Reduced Demadex to 20 mg daily  Controlled on 10/21 9.  BPH with urinary retention,likely neurogenic bladder.  Flomax 0.8 mg daily.    Insert foley 10/24- will need to f/u with Urology after d/c  10/25 -will set up with Dr Claudia Desanctis 10.  Hyperlipidemia.   Crestor 11.  Hypothyroidism.  Synthroid 12.  CAD with CABG 2019.  Aspirin currently on hold 13.  Constipation/neurogenic bowel.   On Fibercon Senna only 1 tab daily.  Improving 14.  GERD.  Protonix 15. Azotemia  Creatinine 1.08 on 10/17  Encourage fluids 16.  Mild hyponatremia  Sodium 134 on 10/17, may have some improvement with decrease in Demadex  17.  Thrombocytopenia  Platelets 114 on 10/17, labs ordered for Monday  10/24- Plts 126- will need to monitor outpt.   10/25- explained it's better- have PCP check Platelets in next 2 weeks- if gets below 75k, can hold lovenox.    I spent a total of 33 minutes on total care- >50% coordination of care- speaking with SW and prolonged d/w pt/wife and nursing about d/c plans, lovenox and Urology and my f/u.   LOS: 23 days A FACE TO FACE EVALUATION WAS PERFORMED  Norissa Bartee 08/24/2021, 9:11 AM

## 2021-08-24 NOTE — Progress Notes (Signed)
Discharge Note:  Upon arrival patient alert and oriented x4, compliant with medication administration. Catheter intact, patent, draining yellow clear urine. Skin intact. Wife at bedside. Marlowe Shores, PA provided discharge instructions/reviewed medications with patient and wife. Staff assisted patient off unit and into private car with no issues. Patient discharged.

## 2021-08-24 NOTE — Progress Notes (Signed)
Inpatient Rehabilitation Discharge Medication Review by a Pharmacist  A complete drug regimen review was completed for this patient to identify any potential clinically significant medication issues.  High Risk Drug Classes Is patient taking? Indication by Medication  Antipsychotic No   Anticoagulant Yes Lovenox for VTE prophlyaxis  Antibiotic No   Opioid Yes Percocet prn for moderate pain  Antiplatelet No   Hypoglycemics/insulin Yes Metformin for glucose control  Vasoactive Medication Yes Toprol for blood pressure  Chemotherapy No   Other Yes Gabapentin: pain Torsemide diuresis/blood pressure KCl: supplement Trazodone: sleep Levothyroxine: supplement Lisinopril: blood pressure Pantoprazole: GERD Rosuvastatin: cholesterol Tamsulosin: BPH/ urinary retention Citalopram: depression Alendronate: osteoporosis MVI: supplement Polycarbophil: laxative Prn Albuterol for shortness of breath/wheezing, metaxalone four muscle spasms, SL nitroglycerin for chest pain, acetaminophen for mild pain or fever     Type of Medication Issue Identified Description of Issue Recommendation(s)  Drug Interaction(s) (clinically significant)     Duplicate Therapy     Allergy     No Medication Administration End Date  Lovenox for 3 months > initial stop time noted for 1 month Stop time updated to 3 months per discussion.  Incorrect Dose     Additional Drug Therapy Needed     Significant med changes from prior encounter (inform family/care partners about these prior to discharge). Staying off Aspirin for now PA/MD informed patient and family  Other       Clinically significant medication issues were identified that warrant physician communication and completion of prescribed/recommended actions by midnight of the next day:  Yes  Name of provider notified for urgent issues identified: Marlowe Shores, PA-C  Provider Method of Notification: face-to-face  Pharmacist comments:  Lovenox for prophylaxis  begun 08/20/21 > to continue thru 11/20/21  Time spent performing this drug regimen review (minutes):  20   [name], [name] 08/24/2021 9:53 AM

## 2021-08-24 NOTE — Progress Notes (Signed)
Inpatient Rehabilitation Care Coordinator Discharge Note   Patient Details  Name: [name] MRN: [id number] Date of Birth: December 11, 1950   Discharge location: D/c to home with his wife and support from family  Length of Stay: 22 days  Discharge activity level: wheelchair level Modified Independent, Supervision for squat pivot transfers, and CGA for sit to stand and transfers with RW  Home/community participation: Limited  Patient response BD:ZHGDJM Literacy - How often do you need to have someone help you when you read instructions, pamphlets, or other written material from your doctor or pharmacy?: Never  Patient response EQ:ASTMHD Isolation - How often do you feel lonely or isolated from those around you?: Never  Services provided included: MD, RD, PT, OT, SLP, RN, CM, Neuropsych, SW, Pharmacy, TR  Financial Services:  Charity fundraiser Utilized: [place] [employer]  Choices offered to/list presented to: Yes  Follow-up services arranged:  Home Health, DME Home Health Agency: [facility] for HHPT/OT/SN/aide    DME : [place] for [facility] and RW; [place] w/c    Patient response to transportation need: Is the patient able to respond to transportation needs?: Yes In the past 12 months, has lack of transportation kept you from medical appointments or from getting medications?: No In the past 12 months, has lack of transportation kept you from meetings, work, or from getting things needed for daily living?: No  Comments (or additional information):  Patient/Family verbalized understanding of follow-up arrangements:  Yes  Individual responsible for coordination of the follow-up plan: contact pt [phone] or pt wife [name]#[phone]  Confirmed correct DME delivered: Rana Snare 08/24/2021    Rana Snare

## 2021-08-26 ENCOUNTER — Telehealth: Payer: Self-pay

## 2021-08-26 DIAGNOSIS — I110 Hypertensive heart disease with heart failure: Secondary | ICD-10-CM | POA: Diagnosis not present

## 2021-08-26 DIAGNOSIS — M519 Unspecified thoracic, thoracolumbar and lumbosacral intervertebral disc disorder: Secondary | ICD-10-CM | POA: Diagnosis not present

## 2021-08-26 DIAGNOSIS — I252 Old myocardial infarction: Secondary | ICD-10-CM | POA: Diagnosis not present

## 2021-08-26 DIAGNOSIS — E039 Hypothyroidism, unspecified: Secondary | ICD-10-CM | POA: Diagnosis not present

## 2021-08-26 DIAGNOSIS — N319 Neuromuscular dysfunction of bladder, unspecified: Secondary | ICD-10-CM | POA: Diagnosis not present

## 2021-08-26 DIAGNOSIS — F32A Depression, unspecified: Secondary | ICD-10-CM | POA: Diagnosis not present

## 2021-08-26 DIAGNOSIS — K219 Gastro-esophageal reflux disease without esophagitis: Secondary | ICD-10-CM | POA: Diagnosis not present

## 2021-08-26 DIAGNOSIS — R338 Other retention of urine: Secondary | ICD-10-CM | POA: Diagnosis not present

## 2021-08-26 DIAGNOSIS — Z981 Arthrodesis status: Secondary | ICD-10-CM | POA: Diagnosis not present

## 2021-08-26 DIAGNOSIS — G8929 Other chronic pain: Secondary | ICD-10-CM | POA: Diagnosis not present

## 2021-08-26 DIAGNOSIS — D693 Immune thrombocytopenic purpura: Secondary | ICD-10-CM | POA: Diagnosis not present

## 2021-08-26 DIAGNOSIS — M96840 Postprocedural hematoma of a musculoskeletal structure following a musculoskeletal system procedure: Secondary | ICD-10-CM | POA: Diagnosis not present

## 2021-08-26 DIAGNOSIS — G8250 Quadriplegia, unspecified: Secondary | ICD-10-CM | POA: Diagnosis not present

## 2021-08-26 DIAGNOSIS — E7800 Pure hypercholesterolemia, unspecified: Secondary | ICD-10-CM | POA: Diagnosis not present

## 2021-08-26 DIAGNOSIS — R339 Retention of urine, unspecified: Secondary | ICD-10-CM | POA: Diagnosis not present

## 2021-08-26 DIAGNOSIS — I5042 Chronic combined systolic (congestive) and diastolic (congestive) heart failure: Secondary | ICD-10-CM | POA: Diagnosis not present

## 2021-08-26 DIAGNOSIS — Z87891 Personal history of nicotine dependence: Secondary | ICD-10-CM | POA: Diagnosis not present

## 2021-08-26 DIAGNOSIS — I25119 Atherosclerotic heart disease of native coronary artery with unspecified angina pectoris: Secondary | ICD-10-CM | POA: Diagnosis not present

## 2021-08-26 DIAGNOSIS — G4700 Insomnia, unspecified: Secondary | ICD-10-CM | POA: Diagnosis not present

## 2021-08-26 DIAGNOSIS — M4802 Spinal stenosis, cervical region: Secondary | ICD-10-CM | POA: Diagnosis not present

## 2021-08-26 DIAGNOSIS — Z951 Presence of aortocoronary bypass graft: Secondary | ICD-10-CM | POA: Diagnosis not present

## 2021-08-26 DIAGNOSIS — M4712 Other spondylosis with myelopathy, cervical region: Secondary | ICD-10-CM | POA: Diagnosis not present

## 2021-08-26 NOTE — Telephone Encounter (Signed)
Patient's daughter called and stated when he was discharged from the hospital, he was not sent home with any suppositories. She states He is having trouble going to the bathroom now. Please advise.

## 2021-08-26 NOTE — Telephone Encounter (Signed)
Have called daughter- pt's suppositories and oral bowel meds are all over the counter- so I cannot prescribe them- did remind her/pt that I mentioned that, but pt didn't remember-   Asked them to look at d/c sheet and will tell him which oral bowel meds taking as well to help him go/do bowel program-   Thanks- ML

## 2021-08-27 ENCOUNTER — Telehealth: Payer: Self-pay | Admitting: Family Medicine

## 2021-08-27 ENCOUNTER — Telehealth: Payer: Self-pay | Admitting: *Deleted

## 2021-08-27 ENCOUNTER — Other Ambulatory Visit: Payer: Self-pay | Admitting: Family Medicine

## 2021-08-27 MED ORDER — OXYCODONE-ACETAMINOPHEN 5-325 MG PO TABS: ORAL_TABLET | ORAL | 0 refills | Status: DC

## 2021-08-27 NOTE — Telephone Encounter (Signed)
I did send in Percocet 5 mg you may use 1 every 4 hours as needed for pain caution drowsiness I do not recommend muscle relaxers with opioids Family is aware

## 2021-08-27 NOTE — Telephone Encounter (Signed)
Patient taking  the Oxycodone 2 every 4 hours- Daughter (DPR) was advised that Dr Nicki Reaper does not feel muscle relaxers ar safe on pain medication and he would be unable to prescribe. Daughter verbalized understanding.  [facility]

## 2021-08-27 NOTE — Telephone Encounter (Deleted)
Prior auth for metaxalone 800 mg submitted to insurance via CMM.

## 2021-08-27 NOTE — Telephone Encounter (Signed)
Please verify with Harrie Jeans in regards to the Percocet how often on the Percocet? Also I am highly do not like prescribing muscle relaxers with pain medicine because of increased risk of accidental overdose respiratory depression and death so therefore I am not going to be prescribing a muscle relaxer

## 2021-08-27 NOTE — Telephone Encounter (Signed)
Pain medication will need follow-up

## 2021-08-27 NOTE — Telephone Encounter (Signed)
Patient just released from inpatient rehab. Was prescribed percocet but only given 3, not enough to last through the weekend. Also prescribed muscle relaxer's but they cost over $500 and they could not get them. Daughter asked if he could have a refill of the percocet and some other muscle relaxer that they could afford.   [facility]   [phone]

## 2021-08-27 NOTE — Telephone Encounter (Signed)
I would recommend a follow-up office visit somewhere within the next 3 weeks if he is able to get out regarding his pain medicine thank you

## 2021-08-30 ENCOUNTER — Telehealth: Payer: Self-pay | Admitting: Student

## 2021-08-30 ENCOUNTER — Ambulatory Visit (INDEPENDENT_AMBULATORY_CARE_PROVIDER_SITE_OTHER): Payer: Medicare Other | Admitting: Urology

## 2021-08-30 ENCOUNTER — Other Ambulatory Visit: Payer: Self-pay

## 2021-08-30 ENCOUNTER — Encounter: Payer: Self-pay | Admitting: Urology

## 2021-08-30 VITALS — BP 107/67 | HR 66 | Temp 98.7°F

## 2021-08-30 DIAGNOSIS — R339 Retention of urine, unspecified: Secondary | ICD-10-CM | POA: Diagnosis not present

## 2021-08-30 DIAGNOSIS — Q549 Hypospadias, unspecified: Secondary | ICD-10-CM | POA: Diagnosis not present

## 2021-08-30 NOTE — Progress Notes (Signed)
Urological Symptom Review  Patient is experiencing the following symptoms: N/a   Review of Systems  Gastrointestinal (upper)  : Negative for upper GI symptoms  Gastrointestinal (lower) : Negative for lower GI symptoms  Constitutional : Negative for symptoms  Skin: Negative for skin symptoms  Eyes: Double vision  Ear/Nose/Throat : Sinus problems  Hematologic/Lymphatic: Negative for Hematologic/Lymphatic symptoms  Cardiovascular : Leg swelling  Respiratory : Negative for respiratory symptoms  Endocrine: Negative for endocrine symptoms  Musculoskeletal: Back pain Joint pain  Neurological: Negative for neurological symptoms  Psychologic: Negative for psychiatric symptoms

## 2021-08-30 NOTE — Progress Notes (Signed)
Assessment: 1. Urinary retention   2. Hypospadias in male     Plan: His urinary retention his likely multifactorial, possibly secondary to bladder outlet obstruction and neurogenic bladder secondary to his current postoperative state +/- neuropathy.  Given his current situation, I think it is reasonable to continue bladder drainage with the Foley catheter.  He and his wife are unable to perform intermittent catheterization at the present time.  Hopefully with continued physical therapy and time, he will increase mobility and will require less pain medication which should help his bladder function. Continue tamsulosin 0.8 mg daily. Continue Foley catheter. Return to office in 3 weeks for possible voiding trial versus catheter change.  Chief Complaint:  Chief Complaint  Patient presents with   Urinary Retention     History of Present Illness:  [name] is a 70 y.o. year old male who is seen in consultation from Kathyrn Drown, MD  for evaluation of urinary retention.  He is status post anterior discectomy and C3-4 disc fusion on 07/19/2021.  He reported doing well following this procedure.  He subsequently developed weakness and pain.  He was found to have an epidural hematoma and underwent evacuation.  Since that time, he has had difficulty voiding.  He denies any voiding difficulty prior to his surgery.  He was initially managed with intermittent catheterization every 4 hours.  He reports a voiding small amounts prior to catheterization with volumes around 500 mL.  He had a Foley catheter placed approximately 1 week ago prior to discharge from rehab.  He is currently at home.  His catheter has been draining well.  He has been on tamsulosin for approximately 10 years.  He is currently taking 0.8 mg daily.  He is currently on a bowel regimen.  He is not able to ambulate without assistance.  He has a history of urinary retention following lumbar spine surgery approximately 10 years ago.   He was managed with a suprapubic catheter at that time.  He was able to resume spontaneous voiding after several months.   Past Medical History:  Past Medical History:  Diagnosis Date   Allergy    Carpal tunnel syndrome    Chronic back pain    Chronic combined systolic and diastolic congestive heart failure (HCC)    Chronic ITP (idiopathic thrombocytopenia) (HCC) 05/25/2015   Coronary artery disease    Coronary artery disease involving native coronary artery of native heart with angina pectoris (HCC)    Degenerative disc disease    GERD (gastroesophageal reflux disease)    History of thrombocytopenia    Hypercholesteremia    Hypertension    Hypothyroid    Lumbar pain    Myocardial infarction ([place]) 2009   Obesity    Pneumonia    around age 37   S/P CABG x 3 04/27/2018   LIMA to LAD SVG to OM1 SVG to OM2   Shortness of breath dyspnea     Past Surgical History:  Past Surgical History:  Procedure Laterality Date   ANTERIOR CERVICAL DECOMP/DISCECTOMY FUSION N/A 07/19/2021   Procedure: CERVICAL THREE-FOUR ANTERIOR CERVICAL DECOMPRESSION/DISCECTOMY FUSION;  Surgeon: [name], MD;  Location: [place];  Service: Neurosurgery;  Laterality: N/A;   ANTERIOR CERVICAL DECOMP/DISCECTOMY FUSION N/A 07/22/2021   Procedure: ANTERIOR CERVICAL DECOMPRESSION/DISCECTOMY REVISON FUSION  WITH EVACUATION OF EPIDURAL HEMATOMA;  Surgeon: [name], DO;  Location: [place];  Service: Neurosurgery;  Laterality: N/A;   BACK SURGERY     5 lumbas disc  with cervical and lumbar fusions   BIOPSY N/A 03/06/2014   Procedure: ESOPHAGEAL BIOPSIES;  Surgeon: [name], MD;  Location: [facility];  Service: Endoscopy;  Laterality: N/A;   COLONOSCOPY     COLONOSCOPY WITH PROPOFOL N/A 03/06/2014   Procedure: COLONOSCOPY WITH PROPOFOL;  Surgeon: [name], MD;  Location: [facility];  Service: Endoscopy;  Laterality: N/A;  in cecum at 0807; total withdrawal time 9 minutes   CORONARY ANGIOPLASTY WITH STENT PLACEMENT      2000, and 2004 has 3 stents   CORONARY ARTERY BYPASS GRAFT N/A 04/27/2018   Procedure: CORONARY ARTERY BYPASS GRAFTING (CABG) x Three , using left internal mammary artery and right leg greater saphenous vein;  Surgeon: [name], MD;  Location: [place];  Service: Open Heart Surgery;  Laterality: N/A;   ESOPHAGOGASTRODUODENOSCOPY (EGD) WITH PROPOFOL N/A 03/06/2014   Procedure: ESOPHAGOGASTRODUODENOSCOPY (EGD) WITH PROPOFOL;  Surgeon: [name], MD;  Location: [facility];  Service: Endoscopy;  Laterality: N/A;   LEFT HEART CATH AND CORONARY ANGIOGRAPHY N/A 04/24/2018   Procedure: LEFT HEART CATH AND CORONARY ANGIOGRAPHY;  Surgeon: [name], MD;  Location: [facility];  Service: Cardiovascular;  Laterality: N/A;   LUMBAR FUSION  2009   MALONEY DILATION N/A 03/06/2014   Procedure: MALONEY DILATION 54 french;  Surgeon: [name], MD;  Location: [facility];  Service: Endoscopy;  Laterality: N/A;   NECK SURGERY     TEE WITHOUT CARDIOVERSION N/A 04/27/2018   Procedure: TRANSESOPHAGEAL ECHOCARDIOGRAM (TEE);  Surgeon: [name], MD;  Location: [place];  Service: Open Heart Surgery;  Laterality: N/A;    Allergies:  Allergies  Allergen Reactions   Penicillins Other (See Comments)    SYNCOPAL EPISODE  Has patient had a PCN reaction causing immediate rash, facial/tongue/throat swelling, SOB or LIGHTHEADEDNESS with HYPOTENSION [SYNCOPE] #  #  #  YES  #  #  #  Has patient had a PCN reaction causing severe rash involving mucus membranes or skin necrosis:No Has patient had a PCN reaction that required hospitalization:No Has patient had a PCN reaction occurring within the last 10 years:Yes    Sulfa Antibiotics Swelling    SWELLING REACTION UNSPECIFIED > PER PREVIOUS PMH   Zoloft [Sertraline Hcl] Other (See Comments)    Confusion     Family History:  Family History  Problem Relation Age of Onset   Heart attack Father    Heart disease Father    Colon cancer Maternal  Grandmother    Esophageal cancer Neg Hx    Rectal cancer Neg Hx    Stomach cancer Neg Hx     Social History:  Social History   Tobacco Use   Smoking status: Former    Packs/day: 2.00    Years: 20.00    Pack years: 40.00    Types: Cigarettes    Quit date: 07/06/1991    Years since quitting: 30.1   Smokeless tobacco: Never  Vaping Use   Vaping Use: Never used  Substance Use Topics   Alcohol use: Yes    Comment: occassional   Drug use: No    Review of symptoms:  Constitutional:  Negative for unexplained weight loss, night sweats, fever, chills ENT:  Negative for nose bleeds, sinus pain, painful swallowing CV:  Negative for chest pain, shortness of breath, exercise intolerance, palpitations, loss of consciousness Resp:  Negative for cough, wheezing, shortness of breath GI:  Negative for nausea, vomiting, diarrhea, bloody stools GU:  Positives noted in HPI; otherwise negative for gross hematuria, dysuria, urinary incontinence Neuro:  Negative for seizures, poor balance, limb weakness, slurred speech Psych:  Negative for lack of energy, depression, anxiety Endocrine:  Negative for polydipsia, polyuria, symptoms of hypoglycemia (dizziness, hunger, sweating) Hematologic:  Negative for anemia, purpura, petechia, prolonged or excessive bleeding, use of anticoagulants  Allergic:  Negative for difficulty breathing or choking as a result of exposure to anything; no shellfish allergy; no allergic response (rash/itch) to materials, foods  Physical exam: BP 107/67   Pulse 66   Temp 98.7 F (37.1 C)  GENERAL APPEARANCE:  Well appearing, well developed, well nourished, NAD HEENT: Atraumatic, Normocephalic, oropharynx clear. NECK: Supple without lymphadenopathy or thyromegaly. LUNGS: Clear to auscultation bilaterally. HEART: Regular Rate and Rhythm without murmurs, gallops, or rubs. ABDOMEN: Soft, non-tender, No Masses. EXTREMITIES: Without clubbing, cyanosis.  Edema of bilateral LE  R>L NEUROLOGIC:  Alert and oriented x 3, in wheelchair, CN II-XII grossly intact.  MENTAL STATUS:  Appropriate. BACK:  Non-tender to palpation.  No CVAT SKIN:  Warm, dry and intact.   GU:  foley in place draining clear urine, hypospadias  Results: No specimen obtained

## 2021-08-30 NOTE — Telephone Encounter (Signed)
Prior auth for metaxalone 800 mg submitted to insurance via CMM.  It has been approved for a non-formulary exception through 10/30/2022 under your Medicare Part D benefit.

## 2021-08-30 NOTE — Telephone Encounter (Signed)
Please contact patient to have him set up appt for pain management within next 3 weeks. Thank you.

## 2021-08-30 NOTE — Telephone Encounter (Signed)
   Patient's daughter called the Answering Service with concerns about lower extremity swelling and discoloration.  Called and spoke with daughter PE.  Patient was recently admitted from 08/01/2021 to 08/24/2021 with spinal hematoma and he has quadriplegia.  Yesterday, family noted some mild pitting edema of both feet.  Today, edema of left foot resolved but he has significant pitting edema of the right lower extremity.  And now he has started to have some discoloration of his right toes/foot.  Daughter states the discoloration is a red/blue and is located near his toes. She states it has not made its way down to the back of the foot yet.  Patient is not complaining of any pain. He has been on Lovenox since discharge given high risk for DVT.  Given sudden significant asymmetric lower extremity swelling with skin discoloration, I think patient needs to be evaluated in the ED.  He may need additional imaging of his lower extremity.  Daughter voiced understanding and agreed.  Darreld Mclean, PA-C 08/30/2021 6:56 PM

## 2021-08-31 DIAGNOSIS — Z981 Arthrodesis status: Secondary | ICD-10-CM | POA: Diagnosis not present

## 2021-08-31 DIAGNOSIS — M96840 Postprocedural hematoma of a musculoskeletal structure following a musculoskeletal system procedure: Secondary | ICD-10-CM | POA: Diagnosis not present

## 2021-08-31 DIAGNOSIS — M4802 Spinal stenosis, cervical region: Secondary | ICD-10-CM | POA: Diagnosis not present

## 2021-08-31 DIAGNOSIS — Z87891 Personal history of nicotine dependence: Secondary | ICD-10-CM | POA: Diagnosis not present

## 2021-08-31 DIAGNOSIS — I25119 Atherosclerotic heart disease of native coronary artery with unspecified angina pectoris: Secondary | ICD-10-CM | POA: Diagnosis not present

## 2021-08-31 DIAGNOSIS — D693 Immune thrombocytopenic purpura: Secondary | ICD-10-CM | POA: Diagnosis not present

## 2021-08-31 DIAGNOSIS — I110 Hypertensive heart disease with heart failure: Secondary | ICD-10-CM | POA: Diagnosis not present

## 2021-08-31 DIAGNOSIS — G8250 Quadriplegia, unspecified: Secondary | ICD-10-CM | POA: Diagnosis not present

## 2021-08-31 DIAGNOSIS — Z951 Presence of aortocoronary bypass graft: Secondary | ICD-10-CM | POA: Diagnosis not present

## 2021-08-31 DIAGNOSIS — E039 Hypothyroidism, unspecified: Secondary | ICD-10-CM | POA: Diagnosis not present

## 2021-08-31 DIAGNOSIS — R338 Other retention of urine: Secondary | ICD-10-CM | POA: Diagnosis not present

## 2021-08-31 DIAGNOSIS — F32A Depression, unspecified: Secondary | ICD-10-CM | POA: Diagnosis not present

## 2021-08-31 DIAGNOSIS — M519 Unspecified thoracic, thoracolumbar and lumbosacral intervertebral disc disorder: Secondary | ICD-10-CM | POA: Diagnosis not present

## 2021-08-31 DIAGNOSIS — G4700 Insomnia, unspecified: Secondary | ICD-10-CM | POA: Diagnosis not present

## 2021-08-31 DIAGNOSIS — I252 Old myocardial infarction: Secondary | ICD-10-CM | POA: Diagnosis not present

## 2021-08-31 DIAGNOSIS — N319 Neuromuscular dysfunction of bladder, unspecified: Secondary | ICD-10-CM | POA: Diagnosis not present

## 2021-08-31 DIAGNOSIS — I5042 Chronic combined systolic (congestive) and diastolic (congestive) heart failure: Secondary | ICD-10-CM | POA: Diagnosis not present

## 2021-08-31 DIAGNOSIS — G8929 Other chronic pain: Secondary | ICD-10-CM | POA: Diagnosis not present

## 2021-08-31 DIAGNOSIS — E7800 Pure hypercholesterolemia, unspecified: Secondary | ICD-10-CM | POA: Diagnosis not present

## 2021-08-31 DIAGNOSIS — M4712 Other spondylosis with myelopathy, cervical region: Secondary | ICD-10-CM | POA: Diagnosis not present

## 2021-08-31 DIAGNOSIS — K219 Gastro-esophageal reflux disease without esophagitis: Secondary | ICD-10-CM | POA: Diagnosis not present

## 2021-09-01 DIAGNOSIS — I5042 Chronic combined systolic (congestive) and diastolic (congestive) heart failure: Secondary | ICD-10-CM | POA: Diagnosis not present

## 2021-09-01 DIAGNOSIS — I252 Old myocardial infarction: Secondary | ICD-10-CM | POA: Diagnosis not present

## 2021-09-01 DIAGNOSIS — D693 Immune thrombocytopenic purpura: Secondary | ICD-10-CM | POA: Diagnosis not present

## 2021-09-01 DIAGNOSIS — I25119 Atherosclerotic heart disease of native coronary artery with unspecified angina pectoris: Secondary | ICD-10-CM | POA: Diagnosis not present

## 2021-09-01 DIAGNOSIS — R338 Other retention of urine: Secondary | ICD-10-CM | POA: Diagnosis not present

## 2021-09-01 DIAGNOSIS — Z951 Presence of aortocoronary bypass graft: Secondary | ICD-10-CM | POA: Diagnosis not present

## 2021-09-01 DIAGNOSIS — F32A Depression, unspecified: Secondary | ICD-10-CM | POA: Diagnosis not present

## 2021-09-01 DIAGNOSIS — M4802 Spinal stenosis, cervical region: Secondary | ICD-10-CM | POA: Diagnosis not present

## 2021-09-01 DIAGNOSIS — M4712 Other spondylosis with myelopathy, cervical region: Secondary | ICD-10-CM | POA: Diagnosis not present

## 2021-09-01 DIAGNOSIS — G4700 Insomnia, unspecified: Secondary | ICD-10-CM | POA: Diagnosis not present

## 2021-09-01 DIAGNOSIS — E7800 Pure hypercholesterolemia, unspecified: Secondary | ICD-10-CM | POA: Diagnosis not present

## 2021-09-01 DIAGNOSIS — I110 Hypertensive heart disease with heart failure: Secondary | ICD-10-CM | POA: Diagnosis not present

## 2021-09-01 DIAGNOSIS — Z87891 Personal history of nicotine dependence: Secondary | ICD-10-CM | POA: Diagnosis not present

## 2021-09-01 DIAGNOSIS — K219 Gastro-esophageal reflux disease without esophagitis: Secondary | ICD-10-CM | POA: Diagnosis not present

## 2021-09-01 DIAGNOSIS — N319 Neuromuscular dysfunction of bladder, unspecified: Secondary | ICD-10-CM | POA: Diagnosis not present

## 2021-09-01 DIAGNOSIS — Z981 Arthrodesis status: Secondary | ICD-10-CM | POA: Diagnosis not present

## 2021-09-01 DIAGNOSIS — M519 Unspecified thoracic, thoracolumbar and lumbosacral intervertebral disc disorder: Secondary | ICD-10-CM | POA: Diagnosis not present

## 2021-09-01 DIAGNOSIS — R339 Retention of urine, unspecified: Secondary | ICD-10-CM | POA: Diagnosis not present

## 2021-09-01 DIAGNOSIS — G8929 Other chronic pain: Secondary | ICD-10-CM | POA: Diagnosis not present

## 2021-09-01 DIAGNOSIS — M96840 Postprocedural hematoma of a musculoskeletal structure following a musculoskeletal system procedure: Secondary | ICD-10-CM | POA: Diagnosis not present

## 2021-09-01 DIAGNOSIS — E039 Hypothyroidism, unspecified: Secondary | ICD-10-CM | POA: Diagnosis not present

## 2021-09-01 DIAGNOSIS — G8250 Quadriplegia, unspecified: Secondary | ICD-10-CM | POA: Diagnosis not present

## 2021-09-03 DIAGNOSIS — R338 Other retention of urine: Secondary | ICD-10-CM | POA: Diagnosis not present

## 2021-09-03 DIAGNOSIS — I25119 Atherosclerotic heart disease of native coronary artery with unspecified angina pectoris: Secondary | ICD-10-CM | POA: Diagnosis not present

## 2021-09-03 DIAGNOSIS — M96840 Postprocedural hematoma of a musculoskeletal structure following a musculoskeletal system procedure: Secondary | ICD-10-CM | POA: Diagnosis not present

## 2021-09-03 DIAGNOSIS — G8929 Other chronic pain: Secondary | ICD-10-CM | POA: Diagnosis not present

## 2021-09-03 DIAGNOSIS — I252 Old myocardial infarction: Secondary | ICD-10-CM | POA: Diagnosis not present

## 2021-09-03 DIAGNOSIS — M4712 Other spondylosis with myelopathy, cervical region: Secondary | ICD-10-CM | POA: Diagnosis not present

## 2021-09-03 DIAGNOSIS — M4802 Spinal stenosis, cervical region: Secondary | ICD-10-CM | POA: Diagnosis not present

## 2021-09-03 DIAGNOSIS — D693 Immune thrombocytopenic purpura: Secondary | ICD-10-CM | POA: Diagnosis not present

## 2021-09-03 DIAGNOSIS — E039 Hypothyroidism, unspecified: Secondary | ICD-10-CM | POA: Diagnosis not present

## 2021-09-03 DIAGNOSIS — N319 Neuromuscular dysfunction of bladder, unspecified: Secondary | ICD-10-CM | POA: Diagnosis not present

## 2021-09-03 DIAGNOSIS — Z951 Presence of aortocoronary bypass graft: Secondary | ICD-10-CM | POA: Diagnosis not present

## 2021-09-03 DIAGNOSIS — G4700 Insomnia, unspecified: Secondary | ICD-10-CM | POA: Diagnosis not present

## 2021-09-03 DIAGNOSIS — K219 Gastro-esophageal reflux disease without esophagitis: Secondary | ICD-10-CM | POA: Diagnosis not present

## 2021-09-03 DIAGNOSIS — I110 Hypertensive heart disease with heart failure: Secondary | ICD-10-CM | POA: Diagnosis not present

## 2021-09-03 DIAGNOSIS — F32A Depression, unspecified: Secondary | ICD-10-CM | POA: Diagnosis not present

## 2021-09-03 DIAGNOSIS — I5042 Chronic combined systolic (congestive) and diastolic (congestive) heart failure: Secondary | ICD-10-CM | POA: Diagnosis not present

## 2021-09-03 DIAGNOSIS — Z87891 Personal history of nicotine dependence: Secondary | ICD-10-CM | POA: Diagnosis not present

## 2021-09-03 DIAGNOSIS — Z981 Arthrodesis status: Secondary | ICD-10-CM | POA: Diagnosis not present

## 2021-09-03 DIAGNOSIS — M519 Unspecified thoracic, thoracolumbar and lumbosacral intervertebral disc disorder: Secondary | ICD-10-CM | POA: Diagnosis not present

## 2021-09-03 DIAGNOSIS — E7800 Pure hypercholesterolemia, unspecified: Secondary | ICD-10-CM | POA: Diagnosis not present

## 2021-09-03 DIAGNOSIS — G8250 Quadriplegia, unspecified: Secondary | ICD-10-CM | POA: Diagnosis not present

## 2021-09-06 ENCOUNTER — Encounter: Payer: Medicare Other | Attending: Registered Nurse | Admitting: Registered Nurse

## 2021-09-06 ENCOUNTER — Other Ambulatory Visit: Payer: Self-pay

## 2021-09-06 VITALS — BP 145/76 | HR 62 | Ht 71.0 in | Wt 222.0 lb

## 2021-09-06 DIAGNOSIS — G8250 Quadriplegia, unspecified: Secondary | ICD-10-CM | POA: Insufficient documentation

## 2021-09-06 DIAGNOSIS — I10 Essential (primary) hypertension: Secondary | ICD-10-CM | POA: Diagnosis not present

## 2021-09-06 DIAGNOSIS — S064XAA Epidural hemorrhage with loss of consciousness status unknown, initial encounter: Secondary | ICD-10-CM | POA: Diagnosis not present

## 2021-09-06 DIAGNOSIS — M62838 Other muscle spasm: Secondary | ICD-10-CM | POA: Diagnosis not present

## 2021-09-06 DIAGNOSIS — N319 Neuromuscular dysfunction of bladder, unspecified: Secondary | ICD-10-CM | POA: Insufficient documentation

## 2021-09-06 DIAGNOSIS — E7849 Other hyperlipidemia: Secondary | ICD-10-CM | POA: Insufficient documentation

## 2021-09-06 NOTE — Progress Notes (Signed)
Subjective:    Patient ID: [name], male    DOB: [birth year], 70 y.o.   MRN: [id number]  HPI: [name] is a 70 y.o. male who is here for [facility] Back appointment for follow up of her Acute Incomplete Quadriplegia , Epidural Hematoma, Neurogenic Bladder, Essential Hypertension and Hyperlipidemia. He presented to [facility] on 07/20/2021 with bilateral lower extremities weakness.  HPI : Oscar Malling NP Chief Complaint: BLE weakness HPI: [name] is a 70 y.o. male with a PMHx significant for ITP, HTN, CAD s/p CABG x3 who is s/p C3/4 ACDF by [name] on 07/19/2021 and discharged home in stable condition on 07/20/2021. He reports doing well following surgery and had significant improvement in his preoperative symptoms. Last night he was taking a shower when he had an acute onset of "shooting pain" throughout his body. His symptoms continued to progress into BLE weakness and then the inability to ambulate. He denies any new numbness or tingling or bowel or bladder dysfunction.   CT Cervical Spine:  IMPRESSION: 1. Recent C3-C4 ACDF with intact hardware and expected prevertebral soft tissue changes. But questionable increased density in the ventral spinal canal, possibly hematoma which may be causing persistent or increased spinal stenosis and spinal cord mass effect. MRI would best evaluate further.   2. Other cervical spine levels appear stable from an August MRI. Chronic C5-C6 ACDF with pseudoarthrosis. Chronic C6-C7 ACDF with solid arthrodesis.  CT Head: WO Contrast:  IMPRESSION: Negative for age non contrast CT appearance of the brain.  MR Cervical Spine :  IMPRESSION: 1. Postoperative increased spinal stenosis and spinal cord mass effect at C3-C4 when compared to the August MRI. This appears multifactorial, related to ventral spinal canal fluid contiguous with the interbody space (perhaps hematoma or seroma) and also some circumferential soft tissue thickening (perhaps  acute dural thickening with associated enhancement). 2. No associated cervical spinal cord signal abnormality is identified. 3. Other cervical levels appear stable since the August MRI. 4. Expected prevertebral postoperative changes.  He underwent ANTERIOR CERVICAL DECOMPRESSION/DISCECTOMY REVISON FUSION  WITH EVACUATION OF EPIDURAL HEMATOMA, on 07/22/2021 by Dr Reatha Armour.   [name] was admitted to inpatient rehabilitation on 08/01/2021 and discharged home on 08/24/2021. He was set up for [facility] therapy with [facility] Lemmings, Mr. Heide  and family state the home health showing up sporadically, this was discussed with Dr Dagoberto Ligas. Referral sent to Outpatient theray at [facility] family request. They verbalize understanding. He states he has pain in his neck, and bilateral shoulders. Also reports bilateral hands and bilateral feet with tingling. He rates his pain 6. He reports he is walking with walker in the home.    Pain Inventory Average Pain 6 Pain Right Now 6 My pain is sharp and burning  In the last 24 hours, has pain interfered with the following? General activity 4 Relation with others 0 Enjoyment of life 2 What TIME of day is your pain at its worst? night Sleep (in general) Fair  Pain is worse with: walking, bending, sitting, and standing Pain improves with: rest, therapy/exercise, and medication Relief from Meds: 7  walk with assistance how many minutes can you walk? 2 ability to climb steps?  no do you drive?  no use a wheelchair  disabled: date disabled . I need assistance with the following:  feeding, dressing, bathing, toileting, meal prep, and household duties  weakness numbness tremor tingling trouble walking spasms dizziness confusion anxiety  Any changes since last visit?  no  Any changes since last visit?  no    Family History  Problem Relation Age of Onset   Heart attack Father    Heart disease Father    Colon cancer Maternal Grandmother    Esophageal  cancer Neg Hx    Rectal cancer Neg Hx    Stomach cancer Neg Hx    Social History   Socioeconomic History   Marital status: Married    Spouse name: Not on file   Number of children: Not on file   Years of education: Not on file   Highest education level: Not on file  Occupational History   Occupation: retireed  Tobacco Use   Smoking status: Former    Packs/day: 2.00    Years: 20.00    Pack years: 40.00    Types: Cigarettes    Quit date: 07/06/1991    Years since quitting: 30.1   Smokeless tobacco: Never  Vaping Use   Vaping Use: Never used  Substance and Sexual Activity   Alcohol use: Yes    Comment: occassional   Drug use: No   Sexual activity: Not Currently    Birth control/protection: None  Other Topics Concern   Not on file  Social History Narrative   Disabled due to chronic back pain   Social Determinants of Health   Financial Resource Strain: Not on file  Food Insecurity: Not on file  Transportation Needs: Not on file  Physical Activity: Not on file  Stress: Not on file  Social Connections: Not on file   Past Surgical History:  Procedure Laterality Date   ANTERIOR CERVICAL DECOMP/DISCECTOMY FUSION N/A 07/19/2021   Procedure: CERVICAL THREE-FOUR ANTERIOR CERVICAL DECOMPRESSION/DISCECTOMY FUSION;  Surgeon: [name], MD;  Location: [place];  Service: Neurosurgery;  Laterality: N/A;   ANTERIOR CERVICAL DECOMP/DISCECTOMY FUSION N/A 07/22/2021   Procedure: ANTERIOR CERVICAL DECOMPRESSION/DISCECTOMY REVISON FUSION  WITH EVACUATION OF EPIDURAL HEMATOMA;  Surgeon: [name], DO;  Location: [place];  Service: Neurosurgery;  Laterality: N/A;   BACK SURGERY     5 lumbas disc with cervical and lumbar fusions   BIOPSY N/A 03/06/2014   Procedure: ESOPHAGEAL BIOPSIES;  Surgeon: [name], MD;  Location: [facility];  Service: Endoscopy;  Laterality: N/A;   COLONOSCOPY     COLONOSCOPY WITH PROPOFOL N/A 03/06/2014   Procedure: COLONOSCOPY WITH PROPOFOL;  Surgeon: [name], MD;  Location: [facility];  Service: Endoscopy;  Laterality: N/A;  in cecum at 0807; total withdrawal time 9 minutes   CORONARY ANGIOPLASTY WITH STENT PLACEMENT     2000, and 2004 has 3 stents   CORONARY ARTERY BYPASS GRAFT N/A 04/27/2018   Procedure: CORONARY ARTERY BYPASS GRAFTING (CABG) x Three , using left internal mammary artery and right leg greater saphenous vein;  Surgeon: [name], MD;  Location: [place];  Service: Open Heart Surgery;  Laterality: N/A;   ESOPHAGOGASTRODUODENOSCOPY (EGD) WITH PROPOFOL N/A 03/06/2014   Procedure: ESOPHAGOGASTRODUODENOSCOPY (EGD) WITH PROPOFOL;  Surgeon: [name], MD;  Location: [facility];  Service: Endoscopy;  Laterality: N/A;   LEFT HEART CATH AND CORONARY ANGIOGRAPHY N/A 04/24/2018   Procedure: LEFT HEART CATH AND CORONARY ANGIOGRAPHY;  Surgeon: [name], MD;  Location: [place] [facility];  Service: Cardiovascular;  Laterality: N/A;   LUMBAR FUSION  2009   MALONEY DILATION N/A 03/06/2014   Procedure: MALONEY DILATION 54 french;  Surgeon: [name], MD;  Location: [facility];  Service: Endoscopy;  Laterality: N/A;  NECK SURGERY     TEE WITHOUT CARDIOVERSION N/A 04/27/2018   Procedure: TRANSESOPHAGEAL ECHOCARDIOGRAM (TEE);  Surgeon: [name], MD;  Location: [place];  Service: Open Heart Surgery;  Laterality: N/A;   Past Medical History:  Diagnosis Date   Allergy    Carpal tunnel syndrome    Chronic back pain    Chronic combined systolic and diastolic congestive heart failure (HCC)    Chronic ITP (idiopathic thrombocytopenia) (HCC) 05/25/2015   Coronary artery disease    Coronary artery disease involving native coronary artery of native heart with angina pectoris (HCC)    Degenerative disc disease    GERD (gastroesophageal reflux disease)    History of thrombocytopenia    Hypercholesteremia    Hypertension    Hypothyroid    Lumbar pain    Myocardial infarction ([place]) 2009   Obesity    Pneumonia    around age 72    S/P CABG x 3 04/27/2018   LIMA to LAD SVG to OM1 SVG to OM2   Shortness of breath dyspnea    BP (!) 145/76   Pulse 62   Ht 5\' 11"  (1.803 m)   Wt 222 lb (100.7 kg)   SpO2 95%   BMI 30.96 kg/m   Opioid Risk Score:   Fall Risk Score:  `1  Depression screen PHQ 2/9  Depression screen [facility] 2/9 04/30/2021 06/11/2018 08/29/2014  Decreased Interest 0 0 0  Down, Depressed, Hopeless 0 0 0  PHQ - 2 Score 0 0 0  Altered sleeping - 0 -  Tired, decreased energy - 0 -  Change in appetite - 0 -  Feeling bad or failure about yourself  - 0 -  Trouble concentrating - 0 -  Moving slowly or fidgety/restless - 0 -  Suicidal thoughts - 0 -  PHQ-9 Score - 0 -  Some recent data might be hidden      Review of Systems  Musculoskeletal:  Positive for gait problem.       Spasms   Neurological:  Positive for dizziness, tremors, weakness and numbness.  Psychiatric/Behavioral:  Positive for confusion. The patient is nervous/anxious.   All other systems reviewed and are negative.     Objective:   Physical Exam Vitals and nursing note reviewed.  Constitutional:      Appearance: Normal appearance.  Neck:     Comments: Cervical Paraspinal Tenderness: C-5-C-6 Cardiovascular:     Rate and Rhythm: Normal rate and regular rhythm.     Pulses: Normal pulses.     Heart sounds:    No gallop.  Pulmonary:     Effort: Pulmonary effort is normal.     Breath sounds: Normal breath sounds.  Genitourinary:    Comments: Foley intact: Draining clear yellow urine  Musculoskeletal:     Comments: Normal Muscle Bulk and Muscle Testing Reveals:  Upper Extremities: Right: Decreased ROM 45 Degrees and Muscle Strength 3/5 Left Upper Extremity: Full ROM and Muscle Strength 5/5 Bilateral AC Joint Tenderness  Lumbar Paraspinal Tenderness: L-4-L-5 Lower Extremities : Right Lower Extremity: Decreased ROM and Muscle Strength 5/5 Left Lower Extremity: Full ROM and Muscle Strength 5/5 Bilateral Lower Extremities Flexion  Produces Pain into bilateral Patellas Arrived in wheelchair     Skin:    General: Skin is warm and dry.  Neurological:     Mental Status: He is alert and oriented to person, place, and time.  Psychiatric:        Mood and Affect: Mood normal.  Behavior: Behavior normal.         Assessment & Plan:  Acute Incomplete Quadriplegia /Epidural Hematoma: Dr Saintclair Halsted Following. Referral placed for Outpatient Therapy at [facility]  2, Neurogenic Bladder: Urology Following. Foley Intact. Continue to Monitor.  3. Essential Hypertension: Continue current medication regimen. Continue to monitor. PCP Following.  4.  Hyperlipidemia. Continue current medication regimen. PCP Following. Continue to Monitor.  5. Muscle Spasm: he was unable to afford Skelaxin, this was discussed with Dr Dagoberto Ligas. Methocarbamol was prescribed and Dr Dagoberto Ligas is awre of the above\.   F/U with Dr Dagoberto Ligas in 4-6 weeks

## 2021-09-07 MED ORDER — METHOCARBAMOL 500 MG PO TABS: 500.0000 mg | ORAL_TABLET | Freq: Two times a day (BID) | ORAL | 2 refills | Status: DC | PRN

## 2021-09-08 ENCOUNTER — Encounter: Payer: Self-pay | Admitting: Registered Nurse

## 2021-09-14 ENCOUNTER — Encounter (HOSPITAL_COMMUNITY): Payer: Self-pay | Admitting: Occupational Therapy

## 2021-09-14 ENCOUNTER — Other Ambulatory Visit: Payer: Self-pay

## 2021-09-14 ENCOUNTER — Telehealth: Payer: Self-pay | Admitting: Registered Nurse

## 2021-09-14 ENCOUNTER — Ambulatory Visit (HOSPITAL_COMMUNITY): Payer: Medicare Other | Attending: Registered Nurse | Admitting: Occupational Therapy

## 2021-09-14 DIAGNOSIS — R2689 Other abnormalities of gait and mobility: Secondary | ICD-10-CM | POA: Insufficient documentation

## 2021-09-14 DIAGNOSIS — M6281 Muscle weakness (generalized): Secondary | ICD-10-CM | POA: Insufficient documentation

## 2021-09-14 DIAGNOSIS — G8250 Quadriplegia, unspecified: Secondary | ICD-10-CM | POA: Insufficient documentation

## 2021-09-14 DIAGNOSIS — R278 Other lack of coordination: Secondary | ICD-10-CM | POA: Insufficient documentation

## 2021-09-14 DIAGNOSIS — R29818 Other symptoms and signs involving the nervous system: Secondary | ICD-10-CM | POA: Diagnosis not present

## 2021-09-14 DIAGNOSIS — R262 Difficulty in walking, not elsewhere classified: Secondary | ICD-10-CM | POA: Diagnosis not present

## 2021-09-14 DIAGNOSIS — R2681 Unsteadiness on feet: Secondary | ICD-10-CM | POA: Insufficient documentation

## 2021-09-14 NOTE — Patient Instructions (Signed)

## 2021-09-14 NOTE — Therapy (Signed)
[facility] [address] [place], [place], [address] Phone: [phone]   Fax:  [phone]  Occupational Therapy Evaluation  Patient Details  Name: [name] MRN: [id number] Date of Birth: [birth year] Referring Provider (OT): Danella Sensing, NP   Encounter Date: 09/14/2021   OT End of Session - 09/14/21 1447     Visit Number 1    Number of Visits 16    Date for OT Re-Evaluation 11/13/21   mini-reassessment 10/12/21   Authorization Type [employer] Medicare; $30 copay per discipline per visit    Authorization Time Period no visit limit    Progress Note Due on Visit 10    OT Start Time 1343    OT Stop Time 1430    OT Time Calculation (min) 47 min    Activity Tolerance Patient tolerated treatment well    Behavior During Therapy [facility] for tasks assessed/performed             Past Medical History:  Diagnosis Date   Allergy    Carpal tunnel syndrome    Chronic back pain    Chronic combined systolic and diastolic congestive heart failure (HCC)    Chronic ITP (idiopathic thrombocytopenia) (HCC) 05/25/2015   Coronary artery disease    Coronary artery disease involving native coronary artery of native heart with angina pectoris (HCC)    Degenerative disc disease    GERD (gastroesophageal reflux disease)    History of thrombocytopenia    Hypercholesteremia    Hypertension    Hypothyroid    Lumbar pain    Myocardial infarction ([place]) 2009   Obesity    Pneumonia    around age 6   S/P CABG x 3 04/27/2018   LIMA to LAD SVG to OM1 SVG to OM2   Shortness of breath dyspnea     Past Surgical History:  Procedure Laterality Date   ANTERIOR CERVICAL DECOMP/DISCECTOMY FUSION N/A 07/19/2021   Procedure: CERVICAL THREE-FOUR ANTERIOR CERVICAL DECOMPRESSION/DISCECTOMY FUSION;  Surgeon: [name], MD;  Location: [place];  Service: Neurosurgery;  Laterality: N/A;   ANTERIOR CERVICAL DECOMP/DISCECTOMY FUSION N/A 07/22/2021   Procedure: ANTERIOR  CERVICAL DECOMPRESSION/DISCECTOMY REVISON FUSION  WITH EVACUATION OF EPIDURAL HEMATOMA;  Surgeon: [name], DO;  Location: [place];  Service: Neurosurgery;  Laterality: N/A;   BACK SURGERY     5 lumbas disc with cervical and lumbar fusions   BIOPSY N/A 03/06/2014   Procedure: ESOPHAGEAL BIOPSIES;  Surgeon: [name], MD;  Location: [facility];  Service: Endoscopy;  Laterality: N/A;   COLONOSCOPY     COLONOSCOPY WITH PROPOFOL N/A 03/06/2014   Procedure: COLONOSCOPY WITH PROPOFOL;  Surgeon: [name], MD;  Location: [facility];  Service: Endoscopy;  Laterality: N/A;  in cecum at 0807; total withdrawal time 9 minutes   CORONARY ANGIOPLASTY WITH STENT PLACEMENT     2000, and 2004 has 3 stents   CORONARY ARTERY BYPASS GRAFT N/A 04/27/2018   Procedure: CORONARY ARTERY BYPASS GRAFTING (CABG) x Three , using left internal mammary artery and right leg greater saphenous vein;  Surgeon: [name], MD;  Location: [place];  Service: Open Heart Surgery;  Laterality: N/A;   ESOPHAGOGASTRODUODENOSCOPY (EGD) WITH PROPOFOL N/A 03/06/2014   Procedure: ESOPHAGOGASTRODUODENOSCOPY (EGD) WITH PROPOFOL;  Surgeon: [name], MD;  Location: [facility];  Service: Endoscopy;  Laterality: N/A;   LEFT HEART CATH AND CORONARY ANGIOGRAPHY N/A 04/24/2018   Procedure: LEFT HEART CATH AND CORONARY ANGIOGRAPHY;  Surgeon: Larae Grooms  S, MD;  Location: [place] [facility];  Service: Cardiovascular;  Laterality: N/A;   LUMBAR FUSION  2009   MALONEY DILATION N/A 03/06/2014   Procedure: MALONEY DILATION 54 french;  Surgeon: [name], MD;  Location: [facility];  Service: Endoscopy;  Laterality: N/A;   NECK SURGERY     TEE WITHOUT CARDIOVERSION N/A 04/27/2018   Procedure: TRANSESOPHAGEAL ECHOCARDIOGRAM (TEE);  Surgeon: [name], MD;  Location: [place];  Service: Open Heart Surgery;  Laterality: N/A;    There were no vitals filed for this visit.   Subjective Assessment - 09/14/21 1440     Subjective  S: My right  side is weaker than the left.    Patient is accompanied by: Family member   wife   Pertinent History Pt is a 70 y/o male who underwent C3/4 ACDF on 07/19/21 then presented back to the ED on 07/20/21 with cervical stenosis with right greater than left weakness due to C3-4 epidural hematoma; cervical myelopathy. Pt underwent C3/4 revision and evacuation of hematoma, Placement of intervertebral biomechanical device C3-4, globus interbody, and Placement of anterior instrumentation consisting of interbody plate and screws -globus plate, 46mm screws x4  on 07/22/21. Pt admitted to CIR from 10/3 to 08/23/21. He has been referred to occupational therapy for evaluation and treatment by Danella Sensing, NP.    Special Tests Complete DASH next session    Patient Stated Goals To be able to use my arms.    Currently in Pain? No/denies               [facility] OT Assessment - 09/14/21 1336       Assessment   Medical Diagnosis Acute incomplete quadriplegia    Referring Provider (OT) Danella Sensing, NP    Onset Date/Surgical Date 07/22/21    Hand Dominance Right    Prior Therapy Acute and CIR, some HH      Precautions   Precautions Cervical;Fall    Required Braces or Orthoses Cervical Brace    Cervical Brace Soft collar;For comfort      Balance Screen   Has the patient fallen in the past 6 months Yes    How many times? 5    Has the patient had a decrease in activity level because of a fear of falling?  No    Is the patient reluctant to leave their home because of a fear of falling?  No      Prior Function   Level of Independence Independent    Vocation Retired    Leisure working on [employer], walking the dog, nature watching, piddling in flower      ADL   Eating/Feeding Modified independent    Upper Body Dressing Independent    Lower Body Dressing Moderate assistance   due to threading Dance movement psychotherapist Modified independent     Toileting -  Marketing executive seat with back;Walk in shower    ADL comments Pt is able to complete basic grooming independently, max assist with shaving. Pt is able to dress with exception of donning pants and managing catheter. Pt has difficulty with gripping items, feeding himself with right hand, right hand is weaker than left hand. Pt with difficulty reaching up and overhead with right hand.      Written Expression  Dominant Hand Right      Vision - History   Baseline Vision Wears glasses all the time    Patient Visual Report Other (comment)    Additional Comments double vision at times, now has lazy eye      Cognition   Overall Cognitive Status Within Functional Limits for tasks assessed      Sensation   Light Touch Impaired by gross assessment      Coordination   9 Hole Peg Test Right;Left    Right 9 Hole Peg Test 7'30"    Left 9 Hole Peg Test 1'46"      ROM / Strength   AROM / PROM / Strength AROM;Strength      AROM   AROM Assessment Site Shoulder    Right/Left Shoulder Right    Right Shoulder Flexion 76 Degrees    Right Shoulder ABduction 56 Degrees    Right Shoulder Internal Rotation 90 Degrees    Right Shoulder External Rotation 52 Degrees      Strength   Strength Assessment Site Shoulder;Elbow;Forearm;Wrist;Hand    Right/Left Shoulder Right;Left    Right Shoulder Flexion 3-/5    Right Shoulder ABduction 3-/5    Right Shoulder Internal Rotation 4+/5    Right Shoulder External Rotation 3-/5    Left Shoulder Flexion 4+/5    Left Shoulder ABduction 4+/5    Left Shoulder Internal Rotation 5/5    Left Shoulder External Rotation 4+/5    Right/Left Elbow Right;Left    Right Elbow Flexion 4+/5    Right Elbow Extension 3+/5    Left Elbow Flexion 5/5    Left Elbow Extension 5/5    Right/Left Forearm Right;Left    Right Forearm  Pronation 3+/5    Right Forearm Supination 3/5    Left Forearm Pronation 5/5    Left Forearm Supination 4/5    Right/Left Wrist Right;Left    Right Wrist Flexion 3/5    Right Wrist Extension 3/5    Right Wrist Radial Deviation 3/5    Right Wrist Ulnar Deviation 3/5    Left Wrist Flexion 5/5    Left Wrist Extension 5/5    Left Wrist Radial Deviation 4+/5    Left Wrist Ulnar Deviation 4+/5    Right/Left hand Right;Left    Right Hand Gross Grasp Functional    Right Hand Grip (lbs) 28    Right Hand Lateral Pinch 13 lbs    Right Hand 3 Point Pinch 10 lbs    Left Hand Gross Grasp Functional    Left Hand Grip (lbs) 70    Left Hand Lateral Pinch 20 lbs    Left Hand 3 Point Pinch 10 lbs                              OT Education - 09/14/21 1446     Education Details theraputty exercises for red putty (pt has at home)    Person(s) Educated Patient;Spouse    Methods Explanation;Handout    Comprehension Verbalized understanding              OT Short Term Goals - 09/14/21 1454       OT SHORT TERM GOAL #1   Title Pt will be provided with and educated on HEP to improve mobility of BUE required for ADL task completion.    Time 4    Period Weeks    Status New    Target Date 10/14/21  OT SHORT TERM GOAL #2   Title Pt will demonstrate RUE A/ROM WFL to improve ability to reach overhead and behind back during dressing tasks.    Time 4    Period Weeks    Status New      OT SHORT TERM GOAL #3   Title Pt will demonstrate improved fine motor coordination required for self-feeding by completing 9 hole peg test in under 1\' 15"  for the left hand and in under 6' for the right hand.    Time 4    Period Weeks    Status New      OT SHORT TERM GOAL #4   Title Pt will demonstrate improved grip strength of at least 35# required for holding items with right hand.    Time 4    Period Weeks    Status New               OT Long Term Goals - 09/14/21 1502        OT LONG TERM GOAL #1   Title Pt will increase RUE strength to 4/5 or greater to improve ability to use RUE as dominant during simple ADLs.    Time 8    Period Weeks    Status New    Target Date 11/13/21      OT LONG TERM GOAL #2   Title Pt will increase right grip strength to 40# or greater and pinch strength to 12# or greater to improve ability to complete functional tasks requiring sustained grip on objects.    Time 8    Period Weeks    Status New      OT LONG TERM GOAL #3   Title Pt will increase fine motor coordination required for operating buttons or tying shoes by completing 9 hole peg test in under 1' with left hand and under 4' with right hand.    Time 8    Period Weeks    Status New      OT LONG TERM GOAL #4   Title Pt will be educated on AE available to faciliate independence in ADLs.    Time 8    Period Weeks    Status New                   Plan - 09/14/21 1448     Clinical Impression Statement A: Pt is a 70 y/o male s/p cervical C3/4 fusion on 07/19/21 who then developed hematoma and required evacuation and revision of fusion on 07/22/2021. Pt presenting with incomplete quadriplegia, right side weaker than left side, limiting ability to complete ADLs and use BUE functionally. Pt demonstrates decreased sensation limiting ability to complete fine motor tasks, using vision to compensate for lack of feeling during testing.    OT Occupational Profile and History Detailed Assessment- Review of Records and additional review of physical, cognitive, psychosocial history related to current functional performance    Occupational performance deficits (Please refer to evaluation for details): ADL's;IADL's;Leisure    Body Structure / Function / Physical Skills ADL;Endurance;UE functional use;Pain;ROM;IADL;Strength;Mobility;Coordination    Rehab Potential Good    Clinical Decision Making Several treatment options, min-mod task modification necessary    Comorbidities  Affecting Occupational Performance: May have comorbidities impacting occupational performance    Modification or Assistance to Complete Evaluation  Min-Moderate modification of tasks or assist with assess necessary to complete eval    OT Frequency 2x / week    OT Duration 8 weeks  OT Treatment/Interventions Self-care/ADL training;Ultrasound;DME and/or AE instruction;Patient/family education;Passive range of motion;Electrical Stimulation;Splinting;Therapist, nutritional;Therapeutic exercise;Manual Therapy;Therapeutic activities    Plan P: Pt will benefit from skilled OT services to improve functional use of BUE required for independence in ADL and leisure tasks. Treatment plan: P/ROM, A/ROM, general BUE strengthening, grip and pinch strengtheing, fine motor coordination training, functional mobility training. NEXT SESSION: Complete FOTO    OT Home Exercise Plan 11/15: red theraputty exercises    Consulted and Agree with Plan of Care Patient;Family member/caregiver    Family Member Consulted Wife             Patient will benefit from skilled therapeutic intervention in order to improve the following deficits and impairments:   Body Structure / Function / Physical Skills: ADL, Endurance, UE functional use, Pain, ROM, IADL, Strength, Mobility, Coordination       Visit Diagnosis: Acute incomplete quadriplegia ([place])  Other lack of coordination  Other symptoms and signs involving the nervous system    Problem List Patient Active Problem List   Diagnosis Date Noted   Cervical myelopathy ([place])    Hyponatremia    Neurogenic bladder    Pressure injury of skin 08/08/2021   Acute incomplete quadriplegia ([place]) 08/01/2021   Epidural hematoma 07/22/2021   Myelopathy ([place]) 07/19/2021   Callus 05/18/2021   Acute bacterial rhinosinusitis 10/05/2020   Cough in adult 10/05/2020   S/P CABG x 3 04/27/2018   Angina pectoris ([place]) 04/24/2018   Obesity    Chronic combined systolic and  diastolic congestive heart failure ([place])    Peripheral edema 06/30/2017   Pedal edema 11/28/2016   Osteopenia 08/13/2015   Thrombocytopenia ([place]) 05/25/2015   Chronic ITP (idiopathic thrombocytopenia) ([place]) 05/25/2015   Hypothyroidism 08/29/2014   Diverticulitis of colon (without mention of hemorrhage)(562.11) 02/04/2014   Dysphagia, unspecified(787.20) 02/04/2014   Hyperlipidemia 11/18/2013   Dyspnea 01/02/2012   Palpitations 01/02/2012   Mixed hyperlipidemia 07/06/2011   Coronary artery disease involving native coronary artery of native heart with angina pectoris ([place])    TOTAL KNEE FOLLOW-UP 08/14/2008   Chronic back pain 07/23/2008   KNEE, ARTHRITIS, DEGEN./OSTEO 05/29/2008   JOINT EFFUSION, KNEE 04/30/2008   Pain in joint, lower leg 10/10/2007   Essential hypertension 10/09/2007    Guadelupe Sabin, OTR/L  [phone] 09/14/2021, 3:50 PM  [place], [place], [address] Phone: [phone]   Fax:  [phone]  Name: [name] MRN: [id number] Date of Birth: 07/21/1951

## 2021-09-14 NOTE — Telephone Encounter (Signed)
PMP was Reviewed. Dr Wolfgang Phoenix prescribed Oxycodone on 08/27/2021. Will  let Mr. Sippel know to give Dr Wolfgang Phoenix a call via My-Chart.

## 2021-09-15 ENCOUNTER — Other Ambulatory Visit: Payer: Self-pay | Admitting: Family Medicine

## 2021-09-15 ENCOUNTER — Telehealth: Payer: Self-pay | Admitting: Family Medicine

## 2021-09-15 NOTE — Telephone Encounter (Signed)
Pt contacted. Pt states he is taking 2 per day. Pt last seen in July. Has upcoming appt next week. Please advise. Thank you

## 2021-09-15 NOTE — Telephone Encounter (Signed)
Patient is requesting refill on oxycodone 5/325 called into [facility]

## 2021-09-16 ENCOUNTER — Other Ambulatory Visit: Payer: Self-pay | Admitting: Family Medicine

## 2021-09-16 ENCOUNTER — Encounter (HOSPITAL_COMMUNITY): Payer: Self-pay | Admitting: Occupational Therapy

## 2021-09-16 ENCOUNTER — Ambulatory Visit (HOSPITAL_COMMUNITY): Payer: Medicare Other | Admitting: Occupational Therapy

## 2021-09-16 ENCOUNTER — Other Ambulatory Visit: Payer: Self-pay

## 2021-09-16 DIAGNOSIS — R278 Other lack of coordination: Secondary | ICD-10-CM

## 2021-09-16 DIAGNOSIS — R29818 Other symptoms and signs involving the nervous system: Secondary | ICD-10-CM

## 2021-09-16 DIAGNOSIS — G8250 Quadriplegia, unspecified: Secondary | ICD-10-CM

## 2021-09-16 DIAGNOSIS — R2681 Unsteadiness on feet: Secondary | ICD-10-CM | POA: Diagnosis not present

## 2021-09-16 DIAGNOSIS — M6281 Muscle weakness (generalized): Secondary | ICD-10-CM | POA: Diagnosis not present

## 2021-09-16 DIAGNOSIS — R262 Difficulty in walking, not elsewhere classified: Secondary | ICD-10-CM | POA: Diagnosis not present

## 2021-09-16 DIAGNOSIS — R2689 Other abnormalities of gait and mobility: Secondary | ICD-10-CM | POA: Diagnosis not present

## 2021-09-16 MED ORDER — OXYCODONE-ACETAMINOPHEN 5-325 MG PO TABS: ORAL_TABLET | ORAL | 0 refills | Status: DC

## 2021-09-16 NOTE — Therapy (Signed)
[facility] [address] [place], [place], [address] Phone: [phone]   Fax:  [phone]  Occupational Therapy Treatment  Patient Details  Name: [name] MRN: [id number] Date of Birth: [birth year] Referring Provider (OT): Danella Sensing, NP   Encounter Date: 09/16/2021   OT End of Session - 09/16/21 1519     Visit Number 2    Number of Visits 16    Date for OT Re-Evaluation 11/13/21   mini-reassessment 10/12/21   Authorization Type [employer] Medicare; $30 copay per discipline per visit    Authorization Time Period no visit limit    Progress Note Due on Visit 10    OT Start Time 1430    OT Stop Time 1512    OT Time Calculation (min) 42 min    Activity Tolerance Patient tolerated treatment well    Behavior During Therapy [facility] for tasks assessed/performed             Past Medical History:  Diagnosis Date   Allergy    Carpal tunnel syndrome    Chronic back pain    Chronic combined systolic and diastolic congestive heart failure (HCC)    Chronic ITP (idiopathic thrombocytopenia) (HCC) 05/25/2015   Coronary artery disease    Coronary artery disease involving native coronary artery of native heart with angina pectoris (HCC)    Degenerative disc disease    GERD (gastroesophageal reflux disease)    History of thrombocytopenia    Hypercholesteremia    Hypertension    Hypothyroid    Lumbar pain    Myocardial infarction ([place]) 2009   Obesity    Pneumonia    around age 70   S/P CABG x 3 04/27/2018   LIMA to LAD SVG to OM1 SVG to OM2   Shortness of breath dyspnea     Past Surgical History:  Procedure Laterality Date   ANTERIOR CERVICAL DECOMP/DISCECTOMY FUSION N/A 07/19/2021   Procedure: CERVICAL THREE-FOUR ANTERIOR CERVICAL DECOMPRESSION/DISCECTOMY FUSION;  Surgeon: [name], MD;  Location: [facility];  Service: Neurosurgery;  Laterality: N/A;   ANTERIOR CERVICAL DECOMP/DISCECTOMY FUSION N/A 07/22/2021   Procedure: ANTERIOR  CERVICAL DECOMPRESSION/DISCECTOMY REVISON FUSION  WITH EVACUATION OF EPIDURAL HEMATOMA;  Surgeon: [name], DO;  Location: [facility];  Service: Neurosurgery;  Laterality: N/A;   BACK SURGERY     5 lumbas disc with cervical and lumbar fusions   BIOPSY N/A 03/06/2014   Procedure: ESOPHAGEAL BIOPSIES;  Surgeon: [name], MD;  Location: [facility];  Service: Endoscopy;  Laterality: N/A;   COLONOSCOPY     COLONOSCOPY WITH PROPOFOL N/A 03/06/2014   Procedure: COLONOSCOPY WITH PROPOFOL;  Surgeon: [name], MD;  Location: [facility];  Service: Endoscopy;  Laterality: N/A;  in cecum at 0807; total withdrawal time 9 minutes   CORONARY ANGIOPLASTY WITH STENT PLACEMENT     2000, and 2004 has 3 stents   CORONARY ARTERY BYPASS GRAFT N/A 04/27/2018   Procedure: CORONARY ARTERY BYPASS GRAFTING (CABG) x Three , using left internal mammary artery and right leg greater saphenous vein;  Surgeon: [name], MD;  Location: [facility];  Service: Open Heart Surgery;  Laterality: N/A;   ESOPHAGOGASTRODUODENOSCOPY (EGD) WITH PROPOFOL N/A 03/06/2014   Procedure: ESOPHAGOGASTRODUODENOSCOPY (EGD) WITH PROPOFOL;  Surgeon: [name], MD;  Location: [facility];  Service: Endoscopy;  Laterality: N/A;   LEFT HEART CATH AND CORONARY ANGIOGRAPHY N/A 04/24/2018   Procedure: LEFT HEART CATH AND CORONARY ANGIOGRAPHY;  Surgeon: Larae Grooms  S, MD;  Location: [facility];  Service: Cardiovascular;  Laterality: N/A;   LUMBAR FUSION  2009   MALONEY DILATION N/A 03/06/2014   Procedure: MALONEY DILATION 54 french;  Surgeon: [name], MD;  Location: [facility];  Service: Endoscopy;  Laterality: N/A;   NECK SURGERY     TEE WITHOUT CARDIOVERSION N/A 04/27/2018   Procedure: TRANSESOPHAGEAL ECHOCARDIOGRAM (TEE);  Surgeon: [name], MD;  Location: [facility];  Service: Open Heart Surgery;  Laterality: N/A;    There were no vitals filed for this visit.   Subjective Assessment - 09/16/21 1426     Subjective  S: I stood  up and walked 3 steps yesterday.                [facility] OT Assessment - 09/16/21 1426       Assessment   Medical Diagnosis Acute incomplete quadriplegia      Precautions   Precautions Cervical;Fall    Required Braces or Orthoses Cervical Brace    Cervical Brace Soft collar;For comfort      Observation/Other Assessments   Focus on Therapeutic Outcomes (FOTO)  56/100                      OT Treatments/Exercises (OP) - 09/16/21 1436       Neurological Re-education Exercises   Shoulder Flexion PROM;5 reps;AROM;10 reps;Supine    Shoulder ABduction PROM;5 reps;AROM;10 reps;Supine    Shoulder Protraction AROM;10 reps;Supine    Shoulder Horizontal ABduction PROM;5 reps;AROM;10 reps;Supine    Shoulder External Rotation PROM;5 reps;AROM;10 reps;Supine    Shoulder Internal Rotation PROM;5 reps;AROM;10 reps;Supine    Other Exercises 1 Pt completing pinch tree, working on functional reaching and pinch strengthening. Pt placing 16 pins along vertical bar, all colors, then placed remaining pins along horizontal bars. Mod difficulty reaching above shoulder height and then turning the pins to fit along the horizontal bar.    Sponges 16, 13                    OT Education - 09/16/21 1507     Education Details supine A/ROM    Person(s) Educated Patient    Methods Explanation;Handout;Demonstration    Comprehension Verbalized understanding;Returned demonstration              OT Short Term Goals - 09/16/21 1426       OT SHORT TERM GOAL #1   Title Pt will be provided with and educated on HEP to improve mobility of BUE required for ADL task completion.    Time 4    Period Weeks    Status On-going    Target Date 10/14/21      OT SHORT TERM GOAL #2   Title Pt will demonstrate RUE A/ROM WFL to improve ability to reach overhead and behind back during dressing tasks.    Time 4    Period Weeks    Status On-going      OT SHORT TERM GOAL #3   Title Pt will  demonstrate improved fine motor coordination required for self-feeding by completing 9 hole peg test in under 1\' 15"  for the left hand and in under 6' for the right hand.    Time 4    Period Weeks    Status On-going      OT SHORT TERM GOAL #4   Title Pt will demonstrate improved grip strength of at least 35# required for holding items with right hand.  Time 4    Period Weeks    Status On-going               OT Long Term Goals - 09/16/21 1426       OT LONG TERM GOAL #1   Title Pt will increase RUE strength to 4/5 or greater to improve ability to use RUE as dominant during simple ADLs.    Time 8    Period Weeks    Status On-going      OT LONG TERM GOAL #2   Title Pt will increase right grip strength to 40# or greater and pinch strength to 12# or greater to improve ability to complete functional tasks requiring sustained grip on objects.    Time 8    Period Weeks    Status On-going      OT LONG TERM GOAL #3   Title Pt will increase fine motor coordination required for operating buttons or tying shoes by completing 9 hole peg test in under 1' with left hand and under 4' with right hand.    Time 8    Period Weeks    Status On-going      OT LONG TERM GOAL #4   Title Pt will be educated on AE available to faciliate independence in ADLs.    Time 8    Period Weeks    Status On-going                   Plan - 09/16/21 1519     Clinical Impression Statement A: Pt lying supine and demonstrates P/ROM WFL, limited due to pain at approximately 75% range. Pt completing supine A/ROM with occasional rest breaks for fatigue. Pt completing pinch tree working on right pinch strengthening, motor planning, and control with functional reaching. Also completing sponges today. Verbal cuing for form and technique. HEP updated for supine A/ROM.    Body Structure / Function / Physical Skills ADL;Endurance;UE functional use;Pain;ROM;IADL;Strength;Mobility;Coordination    Plan P: Follow  up on HEP. Continue with supine A/ROM and add proximal shoulder strengthening. Attempt grip strengthening using hand gripper.    OT Home Exercise Plan 11/15: red theraputty exercises; 11/17: supine A/ROM    Consulted and Agree with Plan of Care Patient             Patient will benefit from skilled therapeutic intervention in order to improve the following deficits and impairments:   Body Structure / Function / Physical Skills: ADL, Endurance, UE functional use, Pain, ROM, IADL, Strength, Mobility, Coordination       Visit Diagnosis: Acute incomplete quadriplegia (HCC)  Other lack of coordination  Other symptoms and signs involving the nervous system    Problem List Patient Active Problem List   Diagnosis Date Noted   Cervical myelopathy ([place])    Hyponatremia    Neurogenic bladder    Pressure injury of skin 08/08/2021   Acute incomplete quadriplegia ([place]) 08/01/2021   Epidural hematoma 07/22/2021   Myelopathy ([place]) 07/19/2021   Callus 05/18/2021   Acute bacterial rhinosinusitis 10/05/2020   Cough in adult 10/05/2020   S/P CABG x 3 04/27/2018   Angina pectoris ([place]) 04/24/2018   Obesity    Chronic combined systolic and diastolic congestive heart failure ([place])    Peripheral edema 06/30/2017   Pedal edema 11/28/2016   Osteopenia 08/13/2015   Thrombocytopenia ([place]) 05/25/2015   Chronic ITP (idiopathic thrombocytopenia) ([place]) 05/25/2015   Hypothyroidism 08/29/2014   Diverticulitis of colon (without mention of hemorrhage)(562.11) 02/04/2014  Dysphagia, unspecified(787.20) 02/04/2014   Hyperlipidemia 11/18/2013   Dyspnea 01/02/2012   Palpitations 01/02/2012   Mixed hyperlipidemia 07/06/2011   Coronary artery disease involving native coronary artery of native heart with angina pectoris ([place])    TOTAL KNEE FOLLOW-UP 08/14/2008   Chronic back pain 07/23/2008   KNEE, ARTHRITIS, DEGEN./OSTEO 05/29/2008   JOINT EFFUSION, KNEE 04/30/2008   Pain in joint, lower leg  10/10/2007   Essential hypertension 10/09/2007    Guadelupe Sabin, OTR/L  [phone] 09/16/2021, 3:21 PM  [facility], [place], [address] Phone: [phone]   Fax:  [phone]  Name: [name] MRN: [id number] Date of Birth: 1951-09-06

## 2021-09-16 NOTE — Patient Instructions (Signed)
  Repeat all exercises 10-15 times, 1-2 times per day. Complete while lying on your back.   1) Shoulder Protraction    Begin with elbows by your side, slowly "punch" straight out in front of you.      2) Shoulder Flexion  Supine:             Begin with arms at your side with thumbs pointed up, slowly raise both arms up and forward towards overhead.               3) Horizontal abduction/adduction  Supine:         Begin with arms straight out in front of you, bring out to the side in at "T" shape. Keep arms straight entire time.                 4) Internal & External Rotation   Supine:          Stand with elbows at the side and elbows bent 90 degrees. Move your forearms away from your body, then bring back inward toward the body.     5) Shoulder Abduction  Supine:           Lying on your back begin with your arms flat on the table next to your side. Slowly move your arms out to the side so that they go overhead, in a jumping jack or snow angel movement.

## 2021-09-16 NOTE — Telephone Encounter (Signed)
Prescription was sent in.

## 2021-09-20 ENCOUNTER — Other Ambulatory Visit: Payer: Self-pay | Admitting: *Deleted

## 2021-09-20 ENCOUNTER — Ambulatory Visit (INDEPENDENT_AMBULATORY_CARE_PROVIDER_SITE_OTHER): Payer: Medicare Other | Admitting: Family Medicine

## 2021-09-20 ENCOUNTER — Other Ambulatory Visit: Payer: Self-pay

## 2021-09-20 ENCOUNTER — Encounter (HOSPITAL_COMMUNITY): Payer: Self-pay | Admitting: Occupational Therapy

## 2021-09-20 ENCOUNTER — Ambulatory Visit (HOSPITAL_COMMUNITY): Payer: Medicare Other | Admitting: Occupational Therapy

## 2021-09-20 VITALS — BP 124/60 | HR 66

## 2021-09-20 DIAGNOSIS — E038 Other specified hypothyroidism: Secondary | ICD-10-CM | POA: Diagnosis not present

## 2021-09-20 DIAGNOSIS — D696 Thrombocytopenia, unspecified: Secondary | ICD-10-CM

## 2021-09-20 DIAGNOSIS — N312 Flaccid neuropathic bladder, not elsewhere classified: Secondary | ICD-10-CM

## 2021-09-20 DIAGNOSIS — R29818 Other symptoms and signs involving the nervous system: Secondary | ICD-10-CM | POA: Diagnosis not present

## 2021-09-20 DIAGNOSIS — G8250 Quadriplegia, unspecified: Secondary | ICD-10-CM | POA: Diagnosis not present

## 2021-09-20 DIAGNOSIS — M6281 Muscle weakness (generalized): Secondary | ICD-10-CM | POA: Diagnosis not present

## 2021-09-20 DIAGNOSIS — G8929 Other chronic pain: Secondary | ICD-10-CM | POA: Diagnosis not present

## 2021-09-20 DIAGNOSIS — R278 Other lack of coordination: Secondary | ICD-10-CM

## 2021-09-20 DIAGNOSIS — E7849 Other hyperlipidemia: Secondary | ICD-10-CM

## 2021-09-20 DIAGNOSIS — R2689 Other abnormalities of gait and mobility: Secondary | ICD-10-CM | POA: Diagnosis not present

## 2021-09-20 DIAGNOSIS — Z79899 Other long term (current) drug therapy: Secondary | ICD-10-CM

## 2021-09-20 DIAGNOSIS — E559 Vitamin D deficiency, unspecified: Secondary | ICD-10-CM

## 2021-09-20 DIAGNOSIS — R2681 Unsteadiness on feet: Secondary | ICD-10-CM | POA: Diagnosis not present

## 2021-09-20 DIAGNOSIS — R262 Difficulty in walking, not elsewhere classified: Secondary | ICD-10-CM | POA: Diagnosis not present

## 2021-09-20 MED ORDER — OXYCODONE-ACETAMINOPHEN 5-325 MG PO TABS: ORAL_TABLET | ORAL | 0 refills | Status: DC

## 2021-09-20 MED ORDER — TRAZODONE HCL 50 MG PO TABS: 50.0000 mg | ORAL_TABLET | Freq: Every day | ORAL | 5 refills | Status: DC

## 2021-09-20 MED ORDER — GABAPENTIN 300 MG PO CAPS: 600.0000 mg | ORAL_CAPSULE | Freq: Two times a day (BID) | ORAL | 5 refills | Status: DC

## 2021-09-20 NOTE — Progress Notes (Signed)
   Subjective:    Patient ID: [name], male    DOB: 12-29-1950, 70 y.o.   MRN: [id number]  HPI  Patient arrives to discuss pain management. Patient currently doing one oxycodone at bedtime. Thrombocytopenia ([place]) - Plan: CBC with Differential/Platelet  Hypocalcemia - Plan: Basic metabolic panel  Vitamin D deficiency - Plan: VITAMIN D 25 Hydroxy (Vit-D Deficiency, Fractures)  Other specified hypothyroidism - Plan: TSH  Atonic bladder  Other hyperlipidemia - Plan: Lipid panel  High risk medication use - Plan: Hepatic function panel Patient has history of low platelets Recent lab platelets was low but acceptable Also recent labs while in the hospital calcium was low Has hypothyroidism will need TSH Currently has indwelling catheter there is some consideration for suprapubic catheter if he fails retesting after dealing with atonic bladder Chronic pain and discomfort with his neck and back uses 1 Percocet in the evening Denies abusing it  Review of Systems     Objective:   Physical Exam General-in no acute distress Eyes-no discharge Lungs-respiratory rate normal, CTA CV-no murmurs,RRR Extremities skin warm dry no edema Neuro grossly normal Behavior normal, alert        Assessment & Plan:  1. Thrombocytopenia (HCC) Check CBC make sure platelets are in a respectable range-preferably above 100,000 - CBC with Differential/Platelet  2. Hypocalcemia Recheck calcium lab work ordered - Basic metabolic panel  3. Vitamin D deficiency Previous vitamin D deficiency check lab work - VITAMIN D 25 Hydroxy (Vit-D Deficiency, Fractures)  4. Other specified hypothyroidism Hypothyroidism takes his medicine check TSH - TSH  5. Atonic bladder Followed by urology they are going to try to reestablish if his bladder is working better but if not he will need to consider suprapubic catheter  6. Other hyperlipidemia Continue medication check labs - Lipid panel  7. High risk  medication use Check labs - Hepatic function panel  8. Other chronic pain The patient was seen in followup for chronic pain. A review over at their current pain status was discussed. Drug registry was checked. Prescriptions were given.  Regular follow-up recommended. Discussion was held regarding the importance of compliance with medication as well as pain medication contract.  Patient was informed that medication may cause drowsiness and should not be combined  with other medications/alcohol or street drugs. If the patient feels medication is causing altered alertness then do not drive or operate dangerous equipment.  Should be noted that the patient appears to be meeting appropriate use of opioids and response.  Evidenced by improved function and decent pain control without significant side effects and no evidence of overt aberrancy issues.  Upon discussion with the patient today they understand that opioid therapy is optional and they feel that the pain has been refractory to reasonable conservative measures and is significant and affecting quality of life enough to warrant ongoing therapy and wishes to continue opioids.  Refills were provided.  Patient understands that pain medicine is optional He states it does allow him to function better He would like availability for 1 each evening He will try Tylenol at other times Avoid ibuprofen because of blood thinners he is currently on

## 2021-09-20 NOTE — Therapy (Signed)
[facility] [address] [place], [place], [address] Phone: [phone]   Fax:  [phone]  Occupational Therapy Treatment  Patient Details  Name: [name] MRN: [id number] Date of Birth: 1951/09/06 Referring Provider (OT): Danella Sensing, NP   Encounter Date: 09/20/2021   OT End of Session - 09/20/21 1640     Visit Number 3    Number of Visits 16    Date for OT Re-Evaluation 11/13/21   mini-reassessment 10/12/21   Authorization Type [employer] Medicare; $30 copay per discipline per visit    Authorization Time Period no visit limit    Progress Note Due on Visit 10    OT Start Time 1516    OT Stop Time 1557    OT Time Calculation (min) 41 min    Activity Tolerance Patient tolerated treatment well    Behavior During Therapy [facility] for tasks assessed/performed             Past Medical History:  Diagnosis Date   Allergy    Carpal tunnel syndrome    Chronic back pain    Chronic combined systolic and diastolic congestive heart failure (HCC)    Chronic ITP (idiopathic thrombocytopenia) ([place]) 05/25/2015   Coronary artery disease    Coronary artery disease involving native coronary artery of native heart with angina pectoris (HCC)    Degenerative disc disease    GERD (gastroesophageal reflux disease)    History of thrombocytopenia    Hypercholesteremia    Hypertension    Hypothyroid    Lumbar pain    Myocardial infarction ([place]) 2009   Obesity    Pneumonia    around age 70   S/P CABG x 3 04/27/2018   LIMA to LAD SVG to OM1 SVG to OM2   Shortness of breath dyspnea     Past Surgical History:  Procedure Laterality Date   ANTERIOR CERVICAL DECOMP/DISCECTOMY FUSION N/A 07/19/2021   Procedure: CERVICAL THREE-FOUR ANTERIOR CERVICAL DECOMPRESSION/DISCECTOMY FUSION;  Surgeon: [name], MD;  Location: [facility];  Service: Neurosurgery;  Laterality: N/A;   ANTERIOR CERVICAL DECOMP/DISCECTOMY FUSION N/A 07/22/2021   Procedure: ANTERIOR  CERVICAL DECOMPRESSION/DISCECTOMY REVISON FUSION  WITH EVACUATION OF EPIDURAL HEMATOMA;  Surgeon: [name], DO;  Location: [facility];  Service: Neurosurgery;  Laterality: N/A;   BACK SURGERY     5 lumbas disc with cervical and lumbar fusions   BIOPSY N/A 03/06/2014   Procedure: ESOPHAGEAL BIOPSIES;  Surgeon: [name], MD;  [facility];  Service: Endoscopy;  Laterality: N/A;   COLONOSCOPY     COLONOSCOPY WITH PROPOFOL N/A 03/06/2014   Procedure: COLONOSCOPY WITH PROPOFOL;  Surgeon: [name], MD;  [facility];  Service: Endoscopy;  Laterality: N/A;  in cecum at 0807; total withdrawal time 9 minutes   CORONARY ANGIOPLASTY WITH STENT PLACEMENT     2000, and 2004 has 3 stents   CORONARY ARTERY BYPASS GRAFT N/A 04/27/2018   Procedure: CORONARY ARTERY BYPASS GRAFTING (CABG) x Three , using left internal mammary artery and right leg greater saphenous vein;  Surgeon: [name], MD;  Location: [facility];  Service: Open Heart Surgery;  Laterality: N/A;   ESOPHAGOGASTRODUODENOSCOPY (EGD) WITH PROPOFOL N/A 03/06/2014   Procedure: ESOPHAGOGASTRODUODENOSCOPY (EGD) WITH PROPOFOL;  Surgeon: [name], MD;  [facility];  Service: Endoscopy;  Laterality: N/A;   LEFT HEART CATH AND CORONARY ANGIOGRAPHY N/A 04/24/2018   Procedure: LEFT HEART CATH AND CORONARY ANGIOGRAPHY;  Surgeon: Larae Grooms  S, MD;  Location: [facility];  Service: Cardiovascular;  Laterality: N/A;   LUMBAR FUSION  2009   MALONEY DILATION N/A 03/06/2014   Procedure: MALONEY DILATION 54 french;  Surgeon: [name], MD;  [facility];  Service: Endoscopy;  Laterality: N/A;   NECK SURGERY     TEE WITHOUT CARDIOVERSION N/A 04/27/2018   Procedure: TRANSESOPHAGEAL ECHOCARDIOGRAM (TEE);  Surgeon: [name], MD;  Location: [facility];  Service: Open Heart Surgery;  Laterality: N/A;    There were no vitals filed for this visit.   Subjective Assessment - 09/20/21 1514     Subjective  S: They make  me tired.    Patient is accompanied by: Family member   wife   Currently in Pain? No/denies                [facility] OT Assessment - 09/20/21 1514       Assessment   Medical Diagnosis Acute incomplete quadriplegia      Precautions   Precautions Cervical;Fall    Required Braces or Orthoses Cervical Brace    Cervical Brace Soft collar;For comfort                      OT Treatments/Exercises (OP) - 09/20/21 1521       Exercises   Exercises Hand      Hand Exercises   Hand Gripper with Large Beads all beads gripper at 25#, vertical    Hand Gripper with Medium Beads all beads gripper at 25#, vertical    Sponges 19, 17    Other Hand Exercises towel crumple, 5X      Neurological Re-education Exercises   Shoulder Flexion PROM;5 reps;AROM;10 reps;Supine    Shoulder ABduction PROM;5 reps;AROM;10 reps;Supine    Shoulder Protraction AROM;10 reps;Supine    Shoulder Horizontal ABduction PROM;5 reps;AROM;10 reps;Supine    Shoulder External Rotation PROM;5 reps;AROM;10 reps;Supine    Shoulder Internal Rotation PROM;5 reps;AROM;10 reps;Supine    Thumb Opposition 10X, A/ROM to each finger    Other Exercises 2 proximal shoulder strengthening in supine, 10X each, 1 rest break, max difficulty with circles      Fine Motor Coordination (Hand/Wrist)   Fine Motor Coordination Nuts and Bolts    Nuts and Bolts Pt putting together 2 sets of nut, washer, and bolt working on bilateral coordination and right hand fine motor coordination.                    OT Education - 09/20/21 1552     Education Details finger/hand A/ROM    Person(s) Educated Patient    Methods Explanation;Handout;Demonstration    Comprehension Verbalized understanding;Returned demonstration              OT Short Term Goals - 09/16/21 1426       OT SHORT TERM GOAL #1   Title Pt will be provided with and educated on HEP to improve mobility of BUE required for ADL task completion.    Time 4     Period Weeks    Status On-going    Target Date 10/14/21      OT SHORT TERM GOAL #2   Title Pt will demonstrate RUE A/ROM WFL to improve ability to reach overhead and behind back during dressing tasks.    Time 4    Period Weeks    Status On-going      OT SHORT TERM GOAL #3   Title Pt will demonstrate  improved fine motor coordination required for self-feeding by completing 9 hole peg test in under 1\' 15"  for the left hand and in under 6' for the right hand.    Time 4    Period Weeks    Status On-going      OT SHORT TERM GOAL #4   Title Pt will demonstrate improved grip strength of at least 35# required for holding items with right hand.    Time 4    Period Weeks    Status On-going               OT Long Term Goals - 09/16/21 1426       OT LONG TERM GOAL #1   Title Pt will increase RUE strength to 4/5 or greater to improve ability to use RUE as dominant during simple ADLs.    Time 8    Period Weeks    Status On-going      OT LONG TERM GOAL #2   Title Pt will increase right grip strength to 40# or greater and pinch strength to 12# or greater to improve ability to complete functional tasks requiring sustained grip on objects.    Time 8    Period Weeks    Status On-going      OT LONG TERM GOAL #3   Title Pt will increase fine motor coordination required for operating buttons or tying shoes by completing 9 hole peg test in under 1' with left hand and under 4' with right hand.    Time 8    Period Weeks    Status On-going      OT LONG TERM GOAL #4   Title Pt will be educated on AE available to faciliate independence in ADLs.    Time 8    Period Weeks    Status On-going                   Plan - 09/20/21 1640     Clinical Impression Statement A: Pt reports he is completing his HEP and it makes him tired. Continued with A/ROM in supine, added proximal shoulder strengthening, pt with mod difficulty with control and max difficulty with circles. Added hand  gripper strengthening and fine motor exercises today. Updated HEP and added fine motor tasks for hand. Verbal cuing for form and technique.    Body Structure / Function / Physical Skills ADL;Endurance;UE functional use;Pain;ROM;IADL;Strength;Mobility;Coordination    Plan P: Follow up on HEP. Continue with supine proximal shoulder strengthening, grip strengthening, and fine motor coordination work    [place] 11/15: red theraputty exercises; 11/17: supine A/ROM; 11/21: hand A/ROM    Consulted and Agree with Plan of Care Patient;Family member/caregiver    Family Member Consulted Wife             Patient will benefit from skilled therapeutic intervention in order to improve the following deficits and impairments:   Body Structure / Function / Physical Skills: ADL, Endurance, UE functional use, Pain, ROM, IADL, Strength, Mobility, Coordination       Visit Diagnosis: Acute incomplete quadriplegia (HCC)  Other lack of coordination  Other symptoms and signs involving the nervous system    Problem List Patient Active Problem List   Diagnosis Date Noted   Cervical myelopathy ([place])    Hyponatremia    Neurogenic bladder    Pressure injury of skin 08/08/2021   Acute incomplete quadriplegia ([place]) 08/01/2021   Epidural hematoma 07/22/2021   Myelopathy ([place]) 07/19/2021  Callus 05/18/2021   Acute bacterial rhinosinusitis 10/05/2020   Cough in adult 10/05/2020   S/P CABG x 3 04/27/2018   Angina pectoris ([place]) 04/24/2018   Obesity    Chronic combined systolic and diastolic congestive heart failure ([place])    Peripheral edema 06/30/2017   Pedal edema 11/28/2016   Osteopenia 08/13/2015   Thrombocytopenia ([place]) 05/25/2015   Chronic ITP (idiopathic thrombocytopenia) ([place]) 05/25/2015   Hypothyroidism 08/29/2014   Diverticulitis of colon (without mention of hemorrhage)(562.11) 02/04/2014   Dysphagia, unspecified(787.20) 02/04/2014   Hyperlipidemia 11/18/2013   Dyspnea  01/02/2012   Palpitations 01/02/2012   Mixed hyperlipidemia 07/06/2011   Coronary artery disease involving native coronary artery of native heart with angina pectoris ([place])    TOTAL KNEE FOLLOW-UP 08/14/2008   Chronic back pain 07/23/2008   KNEE, ARTHRITIS, DEGEN./OSTEO 05/29/2008   JOINT EFFUSION, KNEE 04/30/2008   Pain in joint, lower leg 10/10/2007   Essential hypertension 10/09/2007    Guadelupe Sabin, OTR/L  [phone] 09/20/2021, 4:43 PM  [facility] [place], [place], [address] Phone: [phone]   Fax:  [phone]  Name: ELYAS VILLAMOR MRN: [id number] Date of Birth: [birth year]

## 2021-09-20 NOTE — Patient Instructions (Signed)
Complete each exercise 10-15X, 2-3X/day  1) Towel crunch Place a small towel on a firm table top. Flatten out the towel and then place your hand on one end of it.  Next, flex your fingers 2-5 (index finger through pinky finger) as you pull the towel towards your hand.    2) Thumb/finger opposition Touch the tip of the thumb to each fingertip one by one. Extend fingers fully after they are touched.      3) Finger Taps Start with the hand flat and fingers slightly spread.  One at a time, starting with the thumb, lift each finger up separately.     4) Digit Abduction/Adduction Hold hand palm down flat on table. Spread your fingers apart and back together.

## 2021-09-20 NOTE — Patient Instructions (Signed)

## 2021-09-21 ENCOUNTER — Ambulatory Visit (HOSPITAL_COMMUNITY): Payer: Medicare Other

## 2021-09-21 ENCOUNTER — Encounter (HOSPITAL_COMMUNITY): Payer: Self-pay

## 2021-09-21 DIAGNOSIS — R2689 Other abnormalities of gait and mobility: Secondary | ICD-10-CM | POA: Diagnosis not present

## 2021-09-21 DIAGNOSIS — R2681 Unsteadiness on feet: Secondary | ICD-10-CM | POA: Diagnosis not present

## 2021-09-21 DIAGNOSIS — M6281 Muscle weakness (generalized): Secondary | ICD-10-CM | POA: Diagnosis not present

## 2021-09-21 DIAGNOSIS — R29818 Other symptoms and signs involving the nervous system: Secondary | ICD-10-CM | POA: Diagnosis not present

## 2021-09-21 DIAGNOSIS — R262 Difficulty in walking, not elsewhere classified: Secondary | ICD-10-CM | POA: Diagnosis not present

## 2021-09-21 DIAGNOSIS — R278 Other lack of coordination: Secondary | ICD-10-CM

## 2021-09-21 DIAGNOSIS — G8250 Quadriplegia, unspecified: Secondary | ICD-10-CM | POA: Diagnosis not present

## 2021-09-21 DIAGNOSIS — R339 Retention of urine, unspecified: Secondary | ICD-10-CM | POA: Diagnosis not present

## 2021-09-21 LAB — CBC WITH DIFFERENTIAL/PLATELET
Basophils Absolute: 0.1 10*3/uL (ref 0.0–0.2)
Basos: 1 %
EOS (ABSOLUTE): 0.3 10*3/uL (ref 0.0–0.4)
Eos: 4 %
Hematocrit: 35.3 % — ABNORMAL LOW (ref 37.5–51.0)
Hemoglobin: 11.5 g/dL — ABNORMAL LOW (ref 13.0–17.7)
Immature Grans (Abs): 0 10*3/uL (ref 0.0–0.1)
Immature Granulocytes: 1 %
Lymphocytes Absolute: 0.9 10*3/uL (ref 0.7–3.1)
Lymphs: 13 %
MCH: 29.6 pg (ref 26.6–33.0)
MCHC: 32.6 g/dL (ref 31.5–35.7)
MCV: 91 fL (ref 79–97)
Monocytes Absolute: 0.4 10*3/uL (ref 0.1–0.9)
Monocytes: 6 %
Neutrophils Absolute: 5.2 10*3/uL (ref 1.4–7.0)
Neutrophils: 75 %
Platelets: 145 10*3/uL — ABNORMAL LOW (ref 150–450)
RBC: 3.89 x10E6/uL — ABNORMAL LOW (ref 4.14–5.80)
RDW: 12.6 % (ref 11.6–15.4)
WBC: 6.8 10*3/uL (ref 3.4–10.8)

## 2021-09-21 LAB — LIPID PANEL
Chol/HDL Ratio: 4.3 ratio (ref 0.0–5.0)
Cholesterol, Total: 162 mg/dL (ref 100–199)
HDL: 38 mg/dL — ABNORMAL LOW (ref >39)
LDL Chol Calc (NIH): 107 mg/dL — ABNORMAL HIGH (ref 0–99)
Triglycerides: 88 mg/dL (ref 0–149)
VLDL Cholesterol Cal: 17 mg/dL (ref 5–40)

## 2021-09-21 LAB — TSH: TSH: 0.182 u[IU]/mL — ABNORMAL LOW (ref 0.450–4.500)

## 2021-09-21 LAB — HEPATIC FUNCTION PANEL
ALT: 6 IU/L (ref 0–44)
AST: 11 IU/L (ref 0–40)
Albumin: 4.2 g/dL (ref 3.8–4.8)
Alkaline Phosphatase: 76 IU/L (ref 44–121)
Bilirubin Total: 0.2 mg/dL (ref 0.0–1.2)
Bilirubin, Direct: <0.1 mg/dL (ref 0.00–0.40)
Total Protein: 6.5 g/dL (ref 6.0–8.5)

## 2021-09-21 LAB — VITAMIN D 25 HYDROXY (VIT D DEFICIENCY, FRACTURES): Vit D, 25-Hydroxy: 26.4 ng/mL — ABNORMAL LOW (ref 30.0–100.0)

## 2021-09-21 LAB — BASIC METABOLIC PANEL
BUN/Creatinine Ratio: 12 (ref 10–24)
BUN: 14 mg/dL (ref 8–27)
CO2: 27 mmol/L (ref 20–29)
Calcium: 9.4 mg/dL (ref 8.6–10.2)
Chloride: 99 mmol/L (ref 96–106)
Creatinine, Ser: 1.16 mg/dL (ref 0.76–1.27)
Glucose: 89 mg/dL (ref 70–99)
Potassium: 4.4 mmol/L (ref 3.5–5.2)
Sodium: 141 mmol/L (ref 134–144)
eGFR: 68 mL/min/{1.73_m2} (ref >59)

## 2021-09-21 NOTE — Therapy (Signed)
[facility] [address] [place], [place], [address] Phone: [phone]   Fax:  [phone]  Occupational Therapy Treatment  Patient Details  Name: [name] MRN: [id number] Date of Birth: 17-Jan-1951 Referring Provider (OT): Danella Sensing, NP   Encounter Date: 09/21/2021   OT End of Session - 09/21/21 1617     Visit Number 4    Number of Visits 16    Date for OT Re-Evaluation 11/13/21   mini-reassessment 10/12/21   Authorization Type [employer] Medicare; $30 copay per discipline per visit    Authorization Time Period no visit limit    Progress Note Due on Visit 10    OT Start Time 1515    OT Stop Time 1553    OT Time Calculation (min) 38 min    Activity Tolerance Patient tolerated treatment well    Behavior During Therapy [facility] for tasks assessed/performed             Past Medical History:  Diagnosis Date   Allergy    Carpal tunnel syndrome    Chronic back pain    Chronic combined systolic and diastolic congestive heart failure (HCC)    Chronic ITP (idiopathic thrombocytopenia) ([place]) 05/25/2015   Coronary artery disease    Coronary artery disease involving native coronary artery of native heart with angina pectoris (HCC)    Degenerative disc disease    GERD (gastroesophageal reflux disease)    History of thrombocytopenia    Hypercholesteremia    Hypertension    Hypothyroid    Lumbar pain    Myocardial infarction ([place]) 2009   Obesity    Pneumonia    around age 16   S/P CABG x 3 04/27/2018   LIMA to LAD SVG to OM1 SVG to OM2   Shortness of breath dyspnea     Past Surgical History:  Procedure Laterality Date   ANTERIOR CERVICAL DECOMP/DISCECTOMY FUSION N/A 07/19/2021   Procedure: CERVICAL THREE-FOUR ANTERIOR CERVICAL DECOMPRESSION/DISCECTOMY FUSION;  Surgeon: [name], MD;  Location: [facility];  Service: Neurosurgery;  Laterality: N/A;   ANTERIOR CERVICAL DECOMP/DISCECTOMY FUSION N/A 07/22/2021   Procedure: ANTERIOR  CERVICAL DECOMPRESSION/DISCECTOMY REVISON FUSION  WITH EVACUATION OF EPIDURAL HEMATOMA;  Surgeon: [name], DO;  Location: [facility];  Service: Neurosurgery;  Laterality: N/A;   BACK SURGERY     5 lumbas disc with cervical and lumbar fusions   BIOPSY N/A 03/06/2014   Procedure: ESOPHAGEAL BIOPSIES;  Surgeon: [name], MD;  [facility];  Service: Endoscopy;  Laterality: N/A;   COLONOSCOPY     COLONOSCOPY WITH PROPOFOL N/A 03/06/2014   Procedure: COLONOSCOPY WITH PROPOFOL;  Surgeon: [name], MD;  [facility];  Service: Endoscopy;  Laterality: N/A;  in cecum at 0807; total withdrawal time 9 minutes   CORONARY ANGIOPLASTY WITH STENT PLACEMENT     2000, and 2004 has 3 stents   CORONARY ARTERY BYPASS GRAFT N/A 04/27/2018   Procedure: CORONARY ARTERY BYPASS GRAFTING (CABG) x Three , using left internal mammary artery and right leg greater saphenous vein;  Surgeon: [name], MD;  Location: [facility];  Service: Open Heart Surgery;  Laterality: N/A;   ESOPHAGOGASTRODUODENOSCOPY (EGD) WITH PROPOFOL N/A 03/06/2014   Procedure: ESOPHAGOGASTRODUODENOSCOPY (EGD) WITH PROPOFOL;  Surgeon: [name], MD;  [facility];  Service: Endoscopy;  Laterality: N/A;   LEFT HEART CATH AND CORONARY ANGIOGRAPHY N/A 04/24/2018   Procedure: LEFT HEART CATH AND CORONARY ANGIOGRAPHY;  Surgeon: Larae Grooms  S, MD;  Location: [facility];  Service: Cardiovascular;  Laterality: N/A;   LUMBAR FUSION  2009   MALONEY DILATION N/A 03/06/2014   Procedure: MALONEY DILATION 54 french;  Surgeon: [name], MD;  [facility];  Service: Endoscopy;  Laterality: N/A;   NECK SURGERY     TEE WITHOUT CARDIOVERSION N/A 04/27/2018   Procedure: TRANSESOPHAGEAL ECHOCARDIOGRAM (TEE);  Surgeon: [name], MD;  Location: [facility];  Service: Open Heart Surgery;  Laterality: N/A;    There were no vitals filed for this visit.   Subjective Assessment - 09/21/21 1520     Subjective  S: I feel  this morning. I slid off of the bed and couldn't get into my chair.    Currently in Pain? No/denies                [facility] OT Assessment - 09/21/21 1522       Assessment   Medical Diagnosis Acute incomplete quadriplegia      Precautions   Precautions Cervical;Fall    Required Braces or Orthoses Cervical Brace    Cervical Brace Soft collar;For comfort                      OT Treatments/Exercises (OP) - 09/21/21 1522       Exercises   Exercises Hand      Hand Exercises   Hand Gripper with Large Beads all beads gripper at 25#, vertical and horizontal    Hand Gripper with Medium Beads all beads gripper at 25#, vertical and horizontal    Sponges 18,14    Other Hand Exercises with right hand utilized a 3 point pinch along the side of the red clothespin to pick up 15 sponges and place in container.      Neurological Re-education Exercises   Shoulder Flexion AROM;10 reps    Shoulder ABduction AROM;10 reps    Shoulder Protraction AROM;10 reps    Shoulder Horizontal ABduction AROM;10 reps    Shoulder External Rotation AROM;10 reps    Shoulder Internal Rotation AROM;10 reps    Digit Abduction A/ROM 5X    Digit Adduction A/ROM 5X    Thumb Opposition 5X A/ROM                      OT Short Term Goals - 09/16/21 1426       OT SHORT TERM GOAL #1   Title Pt will be provided with and educated on HEP to improve mobility of BUE required for ADL task completion.    Time 4    Period Weeks    Status On-going    Target Date 10/14/21      OT SHORT TERM GOAL #2   Title Pt will demonstrate RUE A/ROM WFL to improve ability to reach overhead and behind back during dressing tasks.    Time 4    Period Weeks    Status On-going      OT SHORT TERM GOAL #3   Title Pt will demonstrate improved fine motor coordination required for self-feeding by completing 9 hole peg test in under 1\' 15"  for the left hand and in under 6' for the right hand.    Time 4    Period  Weeks    Status On-going      OT SHORT TERM GOAL #4   Title Pt will demonstrate improved grip strength of at least 35# required for holding items with right hand.  Time 4    Period Weeks    Status On-going               OT Long Term Goals - 09/16/21 1426       OT LONG TERM GOAL #1   Title Pt will increase RUE strength to 4/5 or greater to improve ability to use RUE as dominant during simple ADLs.    Time 8    Period Weeks    Status On-going      OT LONG TERM GOAL #2   Title Pt will increase right grip strength to 40# or greater and pinch strength to 12# or greater to improve ability to complete functional tasks requiring sustained grip on objects.    Time 8    Period Weeks    Status On-going      OT LONG TERM GOAL #3   Title Pt will increase fine motor coordination required for operating buttons or tying shoes by completing 9 hole peg test in under 1' with left hand and under 4' with right hand.    Time 8    Period Weeks    Status On-going      OT LONG TERM GOAL #4   Title Pt will be educated on AE available to faciliate independence in ADLs.    Time 8    Period Weeks    Status On-going                   Plan - 09/21/21 1617     Clinical Impression Statement A: Pt reports that since his slide off the bed this morning, he has not had a chance to try his new HEP from yesterday. He will complete tomorrow. Focused session on shoulder A/ROM and proximal shoulder strengthening. Max difficulty completing supine A/ROM with good form and technique without taking a rest break after first 2 movements. Completing circles was difficult due to shoulder stability and endurance needed. VC for form and technique were provided during session.    Body Structure / Function / Physical Skills ADL;Endurance;UE functional use;Pain;ROM;IADL;Strength;Mobility;Coordination    Plan P: Follow up on HEP. Continue with fine motor coordination work.    Consulted and Agree with Plan of  Care Patient             Patient will benefit from skilled therapeutic intervention in order to improve the following deficits and impairments:   Body Structure / Function / Physical Skills: ADL, Endurance, UE functional use, Pain, ROM, IADL, Strength, Mobility, Coordination       Visit Diagnosis: Other lack of coordination  Other symptoms and signs involving the nervous system    Problem List Patient Active Problem List   Diagnosis Date Noted   Cervical myelopathy ([place])    Hyponatremia    Neurogenic bladder    Pressure injury of skin 08/08/2021   Acute incomplete quadriplegia ([place]) 08/01/2021   Epidural hematoma 07/22/2021   Myelopathy ([place]) 07/19/2021   Callus 05/18/2021   Acute bacterial rhinosinusitis 10/05/2020   Cough in adult 10/05/2020   S/P CABG x 3 04/27/2018   Angina pectoris ([place]) 04/24/2018   Obesity    Chronic combined systolic and diastolic congestive heart failure ([place])    Peripheral edema 06/30/2017   Pedal edema 11/28/2016   Osteopenia 08/13/2015   Thrombocytopenia ([place]) 05/25/2015   Chronic ITP (idiopathic thrombocytopenia) ([place]) 05/25/2015   Hypothyroidism 08/29/2014   Diverticulitis of colon (without mention of hemorrhage)(562.11) 02/04/2014   Dysphagia, unspecified(787.20) 02/04/2014   Hyperlipidemia  11/18/2013   Dyspnea 01/02/2012   Palpitations 01/02/2012   Mixed hyperlipidemia 07/06/2011   Coronary artery disease involving native coronary artery of native heart with angina pectoris ([place])    TOTAL KNEE FOLLOW-UP 08/14/2008   Chronic back pain 07/23/2008   KNEE, ARTHRITIS, DEGEN./OSTEO 05/29/2008   JOINT EFFUSION, KNEE 04/30/2008   Pain in joint, lower leg 10/10/2007   Essential hypertension 10/09/2007    Ailene Ravel, OTR/L,CBIS  [phone]  09/21/2021, 4:20 PM  [facility] [place], [place], [address] Phone: [phone]   Fax:  [phone]  Name: [name] MRN: [id number] Date of Birth: 04-03-1951

## 2021-09-22 ENCOUNTER — Other Ambulatory Visit: Payer: Self-pay

## 2021-09-22 DIAGNOSIS — I10 Essential (primary) hypertension: Secondary | ICD-10-CM

## 2021-09-22 DIAGNOSIS — E7849 Other hyperlipidemia: Secondary | ICD-10-CM

## 2021-09-22 DIAGNOSIS — E038 Other specified hypothyroidism: Secondary | ICD-10-CM

## 2021-09-22 DIAGNOSIS — I25119 Atherosclerotic heart disease of native coronary artery with unspecified angina pectoris: Secondary | ICD-10-CM

## 2021-09-22 MED ORDER — EZETIMIBE 10 MG PO TABS: 10.0000 mg | ORAL_TABLET | Freq: Every day | ORAL | 2 refills | Status: DC

## 2021-09-22 MED ORDER — VITAMIN D (ERGOCALCIFEROL) 1.25 MG (50000 UNIT) PO CAPS: 50000.0000 [IU] | ORAL_CAPSULE | ORAL | 1 refills | Status: DC

## 2021-09-27 ENCOUNTER — Encounter: Payer: Self-pay | Admitting: Family Medicine

## 2021-09-27 ENCOUNTER — Encounter: Payer: Self-pay | Admitting: Urology

## 2021-09-27 ENCOUNTER — Ambulatory Visit (INDEPENDENT_AMBULATORY_CARE_PROVIDER_SITE_OTHER): Payer: Medicare Other | Admitting: Urology

## 2021-09-27 ENCOUNTER — Other Ambulatory Visit: Payer: Self-pay

## 2021-09-27 VITALS — BP 129/70 | HR 59

## 2021-09-27 DIAGNOSIS — Q549 Hypospadias, unspecified: Secondary | ICD-10-CM

## 2021-09-27 DIAGNOSIS — R339 Retention of urine, unspecified: Secondary | ICD-10-CM | POA: Diagnosis not present

## 2021-09-27 MED ORDER — CIPROFLOXACIN HCL 500 MG PO TABS: 500.0000 mg | ORAL_TABLET | Freq: Two times a day (BID) | ORAL | 0 refills | Status: DC

## 2021-09-27 MED ORDER — CIPROFLOXACIN HCL 500 MG PO TABS
500.0000 mg | ORAL_TABLET | Freq: Once | ORAL | Status: AC
  Administered 2021-09-27: 11:00:00 500 mg via ORAL

## 2021-09-27 MED ORDER — EZETIMIBE 10 MG PO TABS: 10.0000 mg | ORAL_TABLET | Freq: Every day | ORAL | 2 refills | Status: DC

## 2021-09-27 NOTE — Progress Notes (Signed)
Urological Symptom Review  Patient is experiencing the following symptoms: Negative   Review of Systems  Gastrointestinal (upper)  : Negative for upper GI symptoms  Gastrointestinal (lower) : Negative for lower GI symptoms  Constitutional : Negative for symptoms  Skin: Negative for skin symptoms  Eyes: Negative for eye symptoms  Ear/Nose/Throat : Negative for Ear/Nose/Throat symptoms  Hematologic/Lymphatic: Negative for Hematologic/Lymphatic symptoms  Cardiovascular : Negative for cardiovascular symptoms  Respiratory : Negative for respiratory symptoms  Endocrine: Negative for endocrine symptoms  Musculoskeletal: Negative for musculoskeletal symptoms  Neurological: Negative for neurological symptoms  Psychologic: Negative for psychiatric symptoms 

## 2021-09-27 NOTE — Progress Notes (Signed)
Fill and Pull Catheter Removal  Patient is present today for a catheter removal.  Patient was cleaned and prepped in a sterile fashion 350ml of sterile water/ saline was instilled into the bladder when the patient felt the urge to urinate. 87ml of water was then drained from the balloon.  A 16FR foley cath was removed from the bladder no complications were noted .  Patient as then given some time to void on their own.  Patient can void  235ml on their own after some time.  Patient tolerated well.  Performed by: Estill Bamberg RN  Follow up/ Additional notes: MD to see

## 2021-09-27 NOTE — Progress Notes (Signed)
Assessment: 1. Urinary retention   2. Hypospadias in male     Plan: Foley removed after successful voiding trial  Continue tamsulosin 0.8 mg daily. Cipro x1 following Foley removal. Return to office in 7-10 days with bladder scan Patient advised to call or return to the office if he is unable to void after 6 hours  Chief Complaint:  Chief Complaint  Patient presents with   Urinary Retention    History of Present Illness:  [name] is a 70 y.o. year old male who is seen for evaluation of urinary retention.  He is status post anterior discectomy and C3-4 disc fusion on 07/19/2021.  He reported doing well following this procedure.  He subsequently developed weakness and pain.  He was found to have an epidural hematoma and underwent evacuation.  Since that time, he has had difficulty voiding.  He denies any voiding difficulty prior to his surgery.  He was initially managed with intermittent catheterization every 4 hours.  He reports a voiding small amounts prior to catheterization with volumes around 500 mL.  He had a Foley catheter placed prior to discharge from rehab. He was discharged to home.  He has been on tamsulosin for approximately 10 years.  He is currently taking 0.8 mg daily.  He was started on a bowel regimen.  He was not able to ambulate without assistance.  He has a history of urinary retention following lumbar spine surgery approximately 10 years ago.  He was managed with a suprapubic catheter at that time.  He was able to resume spontaneous voiding after several months.  His foley was continued at the time of his visit on 08/30/21 due to his decreased mobility and use of pain medication.  He returns today for follow-up.  His Foley catheter has been draining well.  No gross hematuria.  He has decreased pain and is only taking pain medication once a day.  Ambulation is improving as well.  He has been able to walk some without a walker.  He is having fairly regular bowel  movements with assistance of a stool softener.  Portions of the above documentation were copied from a prior visit for review purposes only.   Past Medical History:  Past Medical History:  Diagnosis Date   Allergy    Carpal tunnel syndrome    Chronic back pain    Chronic combined systolic and diastolic congestive heart failure (HCC)    Chronic ITP (idiopathic thrombocytopenia) (HCC) 05/25/2015   Coronary artery disease    Coronary artery disease involving native coronary artery of native heart with angina pectoris (HCC)    Degenerative disc disease    GERD (gastroesophageal reflux disease)    History of thrombocytopenia    Hypercholesteremia    Hypertension    Hypothyroid    Lumbar pain    Myocardial infarction ([place]) 2009   Obesity    Pneumonia    around age 49   S/P CABG x 3 04/27/2018   LIMA to LAD SVG to OM1 SVG to OM2   Shortness of breath dyspnea     Past Surgical History:  Past Surgical History:  Procedure Laterality Date   ANTERIOR CERVICAL DECOMP/DISCECTOMY FUSION N/A 07/19/2021   Procedure: CERVICAL THREE-FOUR ANTERIOR CERVICAL DECOMPRESSION/DISCECTOMY FUSION;  Surgeon: [name], MD;  Location: [place];  Service: Neurosurgery;  Laterality: N/A;   ANTERIOR CERVICAL DECOMP/DISCECTOMY FUSION N/A 07/22/2021   Procedure: ANTERIOR CERVICAL DECOMPRESSION/DISCECTOMY REVISON FUSION  WITH EVACUATION OF EPIDURAL HEMATOMA;  Surgeon: Dawley,  Theodoro Doing, DO;  Location: [place];  Service: Neurosurgery;  Laterality: N/A;   BACK SURGERY     5 lumbas disc with cervical and lumbar fusions   BIOPSY N/A 03/06/2014   Procedure: ESOPHAGEAL BIOPSIES;  Surgeon: [name], MD;  [facility];  Service: Endoscopy;  Laterality: N/A;   COLONOSCOPY     COLONOSCOPY WITH PROPOFOL N/A 03/06/2014   Procedure: COLONOSCOPY WITH PROPOFOL;  Surgeon: [name], MD;  Location: [facility];  Service: Endoscopy;  Laterality: N/A;  in cecum at 0807; total withdrawal time 9 minutes   CORONARY ANGIOPLASTY  WITH STENT PLACEMENT     2000, and 2004 has 3 stents   CORONARY ARTERY BYPASS GRAFT N/A 04/27/2018   Procedure: CORONARY ARTERY BYPASS GRAFTING (CABG) x Three , using left internal mammary artery and right leg greater saphenous vein;  Surgeon: [name], MD;  Location: [place];  Service: Open Heart Surgery;  Laterality: N/A;   ESOPHAGOGASTRODUODENOSCOPY (EGD) WITH PROPOFOL N/A 03/06/2014   Procedure: ESOPHAGOGASTRODUODENOSCOPY (EGD) WITH PROPOFOL;  Surgeon: [name], MD;  [facility];  Service: Endoscopy;  Laterality: N/A;   LEFT HEART CATH AND CORONARY ANGIOGRAPHY N/A 04/24/2018   Procedure: LEFT HEART CATH AND CORONARY ANGIOGRAPHY;  Surgeon: [name], MD;  Location: [place];  Service: Cardiovascular;  Laterality: N/A;   LUMBAR FUSION  2009   MALONEY DILATION N/A 03/06/2014   Procedure: MALONEY DILATION 54 french;  Surgeon: [name], MD;  [facility];  Service: Endoscopy;  Laterality: N/A;   NECK SURGERY     TEE WITHOUT CARDIOVERSION N/A 04/27/2018   Procedure: TRANSESOPHAGEAL ECHOCARDIOGRAM (TEE);  Surgeon: [name], MD;  Location: [place];  Service: Open Heart Surgery;  Laterality: N/A;    Allergies:  Allergies  Allergen Reactions   Penicillins Other (See Comments)    SYNCOPAL EPISODE  Has patient had a PCN reaction causing immediate rash, facial/tongue/throat swelling, SOB or LIGHTHEADEDNESS with HYPOTENSION [SYNCOPE] #  #  #  YES  #  #  #  Has patient had a PCN reaction causing severe rash involving mucus membranes or skin necrosis:No Has patient had a PCN reaction that required hospitalization:No Has patient had a PCN reaction occurring within the last 10 years:Yes    Sulfa Antibiotics Swelling    SWELLING REACTION UNSPECIFIED > PER PREVIOUS PMH   Zoloft [Sertraline Hcl] Other (See Comments)    Confusion     Family History:  Family History  Problem Relation Age of Onset   Heart attack Father    Heart disease Father     Colon cancer Maternal Grandmother    Esophageal cancer Neg Hx    Rectal cancer Neg Hx    Stomach cancer Neg Hx     Social History:  Social History   Tobacco Use   Smoking status: Former    Packs/day: 2.00    Years: 20.00    Pack years: 40.00    Types: Cigarettes    Quit date: 07/06/1991    Years since quitting: 30.2   Smokeless tobacco: Never  Vaping Use   Vaping Use: Never used  Substance Use Topics   Alcohol use: Yes    Comment: occassional   Drug use: No   ROS: Constitutional:  Negative for fever, chills, weight loss CV: Negative for chest pain, previous MI, hypertension Respiratory:  Negative for shortness of breath, wheezing, sleep apnea, frequent cough GI:  Negative for nausea, vomiting, bloody stool,  GERD   Physical exam: BP 129/70   Pulse (!) 59  GENERAL APPEARANCE:  Well appearing, well developed, well nourished, NAD HEENT:  Atraumatic, normocephalic, oropharynx clear NECK:  Supple without lymphadenopathy or thyromegaly ABDOMEN:  Soft, non-tender, no masses EXTREMITIES:  Moves all extremities well, without clubbing, cyanosis, or edema NEUROLOGIC:  Alert and oriented x 3, in wheelchair, CN II-XII grossly intact MENTAL STATUS:  appropriate BACK:  Non-tender to palpation, No CVAT SKIN:  Warm, dry, and intact   Results: None  Voiding trial: Volume instilled: 325 ml Volume voided:  250 ml

## 2021-09-27 NOTE — Telephone Encounter (Signed)
Nurses-please see result note from 09/20/21 Zetia as mentioned please send in rx

## 2021-09-28 ENCOUNTER — Ambulatory Visit (HOSPITAL_COMMUNITY): Payer: Medicare Other | Admitting: Occupational Therapy

## 2021-09-28 ENCOUNTER — Encounter (HOSPITAL_COMMUNITY): Payer: Self-pay | Admitting: Occupational Therapy

## 2021-09-28 DIAGNOSIS — M6281 Muscle weakness (generalized): Secondary | ICD-10-CM | POA: Diagnosis not present

## 2021-09-28 DIAGNOSIS — R29818 Other symptoms and signs involving the nervous system: Secondary | ICD-10-CM | POA: Diagnosis not present

## 2021-09-28 DIAGNOSIS — R278 Other lack of coordination: Secondary | ICD-10-CM | POA: Diagnosis not present

## 2021-09-28 DIAGNOSIS — R262 Difficulty in walking, not elsewhere classified: Secondary | ICD-10-CM | POA: Diagnosis not present

## 2021-09-28 DIAGNOSIS — R2689 Other abnormalities of gait and mobility: Secondary | ICD-10-CM | POA: Diagnosis not present

## 2021-09-28 DIAGNOSIS — R2681 Unsteadiness on feet: Secondary | ICD-10-CM | POA: Diagnosis not present

## 2021-09-28 DIAGNOSIS — G8250 Quadriplegia, unspecified: Secondary | ICD-10-CM | POA: Diagnosis not present

## 2021-09-28 NOTE — Therapy (Signed)
[facility] [address] [place], [place], [address] Phone: [phone]   Fax:  [phone]  Occupational Therapy Treatment  Patient Details  Name: [name] MRN: [id number] Date of Birth: August 24, 1951 Referring Provider (OT): Danella Sensing, NP   Encounter Date: 09/28/2021   OT End of Session - 09/28/21 1600     Visit Number 5    Number of Visits 16    Date for OT Re-Evaluation 11/13/21   mini-reassessment 10/12/21   Authorization Type [employer] Medicare; $30 copay per discipline per visit    Authorization Time Period no visit limit    Progress Note Due on Visit 10    OT Start Time 1516    OT Stop Time 1559    OT Time Calculation (min) 43 min    Activity Tolerance Patient tolerated treatment well    Behavior During Therapy [facility] for tasks assessed/performed             Past Medical History:  Diagnosis Date   Allergy    Carpal tunnel syndrome    Chronic back pain    Chronic combined systolic and diastolic congestive heart failure (HCC)    Chronic ITP (idiopathic thrombocytopenia) ([place]) 05/25/2015   Coronary artery disease    Coronary artery disease involving native coronary artery of native heart with angina pectoris (HCC)    Degenerative disc disease    GERD (gastroesophageal reflux disease)    History of thrombocytopenia    Hypercholesteremia    Hypertension    Hypothyroid    Lumbar pain    Myocardial infarction ([place]) 2009   Obesity    Pneumonia    around age 5   S/P CABG x 3 04/27/2018   LIMA to LAD SVG to OM1 SVG to OM2   Shortness of breath dyspnea     Past Surgical History:  Procedure Laterality Date   ANTERIOR CERVICAL DECOMP/DISCECTOMY FUSION N/A 07/19/2021   Procedure: CERVICAL THREE-FOUR ANTERIOR CERVICAL DECOMPRESSION/DISCECTOMY FUSION;  Surgeon: [name], MD;  Location: [place];  Service: Neurosurgery;  Laterality: N/A;   ANTERIOR CERVICAL DECOMP/DISCECTOMY FUSION N/A 07/22/2021   Procedure: ANTERIOR  CERVICAL DECOMPRESSION/DISCECTOMY REVISON FUSION  WITH EVACUATION OF EPIDURAL HEMATOMA;  Surgeon: [name], DO;  Location: [facility];  Service: Neurosurgery;  Laterality: N/A;   BACK SURGERY     5 lumbas disc with cervical and lumbar fusions   BIOPSY N/A 03/06/2014   Procedure: ESOPHAGEAL BIOPSIES;  Surgeon: [name], MD;  [facility];  Service: Endoscopy;  Laterality: N/A;   COLONOSCOPY     COLONOSCOPY WITH PROPOFOL N/A 03/06/2014   Procedure: COLONOSCOPY WITH PROPOFOL;  Surgeon: [name], MD;  [facility];  Service: Endoscopy;  Laterality: N/A;  in cecum at 0807; total withdrawal time 9 minutes   CORONARY ANGIOPLASTY WITH STENT PLACEMENT     2000, and 2004 has 3 stents   CORONARY ARTERY BYPASS GRAFT N/A 04/27/2018   Procedure: CORONARY ARTERY BYPASS GRAFTING (CABG) x Three , using left internal mammary artery and right leg greater saphenous vein;  Surgeon: [name], MD;  Location: [place];  Service: Open Heart Surgery;  Laterality: N/A;   ESOPHAGOGASTRODUODENOSCOPY (EGD) WITH PROPOFOL N/A 03/06/2014   Procedure: ESOPHAGOGASTRODUODENOSCOPY (EGD) WITH PROPOFOL;  Surgeon: [name], MD;  [facility];  Service: Endoscopy;  Laterality: N/A;   LEFT HEART CATH AND CORONARY ANGIOGRAPHY N/A 04/24/2018   Procedure: LEFT HEART CATH AND CORONARY ANGIOGRAPHY;  Surgeon: Larae Grooms  S, MD;  Location: [facility];  Service: Cardiovascular;  Laterality: N/A;   LUMBAR FUSION  2009   MALONEY DILATION N/A 03/06/2014   Procedure: MALONEY DILATION 54 french;  Surgeon: [name], MD;  [facility];  Service: Endoscopy;  Laterality: N/A;   NECK SURGERY     TEE WITHOUT CARDIOVERSION N/A 04/27/2018   Procedure: TRANSESOPHAGEAL ECHOCARDIOGRAM (TEE);  Surgeon: [name], MD;  Location: [place] [facility];  Service: Open Heart Surgery;  Laterality: N/A;    There were no vitals filed for this visit.   Subjective Assessment - 09/28/21 1514     Subjective  S: I've been  doing a lot of exercises.    Currently in Pain? Yes    Pain Score 5     Pain Location Shoulder    Pain Orientation Right    Pain Descriptors / Indicators Aching;Sore    Pain Type Acute pain    Pain Radiating Towards N/A    Pain Onset More than a month ago    Pain Frequency Intermittent    Aggravating Factors  increased use    Pain Relieving Factors rest, biofreeze    Effect of Pain on Daily Activities min effect on ADLs    Multiple Pain Sites No                [facility] OT Assessment - 09/28/21 1513       Assessment   Medical Diagnosis Acute incomplete quadriplegia      Precautions   Precautions Cervical;Fall    Required Braces or Orthoses Cervical Brace    Cervical Brace Soft collar;For comfort                      OT Treatments/Exercises (OP) - 09/28/21 1518       Exercises   Exercises Hand      Hand Exercises   Hand Gripper with Large Beads all beads gripper at 29#, vertical    Hand Gripper with Medium Beads all beads gripper at 29#, vertical      Neurological Re-education Exercises   Shoulder Flexion AROM;10 reps;Supine    Shoulder ABduction AROM;10 reps;Supine    Shoulder Protraction AROM;10 reps;Supine;Seated    Shoulder Horizontal ABduction AROM;10 reps;Supine    Shoulder External Rotation AROM;10 reps;Supine;Seated    Shoulder Internal Rotation AROM;10 reps;Supine;Seated    Other Exercises 2 Proximal shoulder strengthening in supine: 10X each A/ROM, 1 rest break      Manual Therapy   Manual Therapy Myofascial release    Manual therapy comments completed separately from therapeutic exercises    Myofascial Release myofascial release to right anterior shoulder and upper trapezius regions to decrease pain and fascial restrictions and increase joint ROM      Fine Motor Coordination (Hand/Wrist)   Fine Motor Coordination Small Pegboard;Manipulating coins;Picking up coins;Stacking coins    Small Pegboard Attempted    Picking up coins Pt picking  up sequence chips off tabletop using thumb and index fingers. Increased time but was able to translate to palm.    Manipulating coins Pt holding sequence chips in hand and working on palm to fingertip translation to place chips on the tabletop. Completed 2x with increased time.    Stacking coins Working on [employer], max difficulty and increased time.                    OT Education - 09/28/21 1554     Education Details Recommended  pt practice with poker or checker chips at home, as well as practicing tearing paper at home.    Person(s) Educated Patient    Methods Explanation;Demonstration    Comprehension Verbalized understanding;Returned demonstration              OT Short Term Goals - 09/16/21 1426       OT SHORT TERM GOAL #1   Title Pt will be provided with and educated on HEP to improve mobility of BUE required for ADL task completion.    Time 4    Period Weeks    Status On-going    Target Date 10/14/21      OT SHORT TERM GOAL #2   Title Pt will demonstrate RUE A/ROM WFL to improve ability to reach overhead and behind back during dressing tasks.    Time 4    Period Weeks    Status On-going      OT SHORT TERM GOAL #3   Title Pt will demonstrate improved fine motor coordination required for self-feeding by completing 9 hole peg test in under 1\' 15"  for the left hand and in under 6' for the right hand.    Time 4    Period Weeks    Status On-going      OT SHORT TERM GOAL #4   Title Pt will demonstrate improved grip strength of at least 35# required for holding items with right hand.    Time 4    Period Weeks    Status On-going               OT Long Term Goals - 09/16/21 1426       OT LONG TERM GOAL #1   Title Pt will increase RUE strength to 4/5 or greater to improve ability to use RUE as dominant during simple ADLs.    Time 8    Period Weeks    Status On-going      OT LONG TERM GOAL #2   Title Pt will increase right grip  strength to 40# or greater and pinch strength to 12# or greater to improve ability to complete functional tasks requiring sustained grip on objects.    Time 8    Period Weeks    Status On-going      OT LONG TERM GOAL #3   Title Pt will increase fine motor coordination required for operating buttons or tying shoes by completing 9 hole peg test in under 1' with left hand and under 4' with right hand.    Time 8    Period Weeks    Status On-going      OT LONG TERM GOAL #4   Title Pt will be educated on AE available to faciliate independence in ADLs.    Time 8    Period Weeks    Status On-going                   Plan - 09/28/21 1551     Clinical Impression Statement A: Pt reports he is completing is HEP daily as well as practicing chair push-ups. Continued with A/ROM in supine as well as proximal shoulder strengthening, pt with signicantly improved control and only requiring OT assist for initiating circles. Increased hand gripper resistance to 29#, trialed small beads however was unable to complete due to fatigue. Added chip manipulation task for fine motor work. Recommended pt practice with poker or checker chips at home, as well as practicing tearing paper at home.  Body Structure / Function / Physical Skills ADL;Endurance;UE functional use;Pain;ROM;IADL;Strength;Mobility;Coordination    Plan P: Continue with A/ROM and functional reaching, proximal shoulder strengthening. Trial small beads with hand gripper at 25#, continue with fine motor work    [place] 11/15: red theraputty exercises; 11/17: supine A/ROM; 11/21: hand A/ROM    Consulted and Agree with Plan of Care Patient             Patient will benefit from skilled therapeutic intervention in order to improve the following deficits and impairments:   Body Structure / Function / Physical Skills: ADL, Endurance, UE functional use, Pain, ROM, IADL, Strength, Mobility, Coordination       Visit  Diagnosis: Other lack of coordination  Other symptoms and signs involving the nervous system    Problem List Patient Active Problem List   Diagnosis Date Noted   Cervical myelopathy ([place])    Hyponatremia    Neurogenic bladder    Pressure injury of skin 08/08/2021   Acute incomplete quadriplegia ([place]) 08/01/2021   Epidural hematoma 07/22/2021   Myelopathy ([place]) 07/19/2021   Callus 05/18/2021   Acute bacterial rhinosinusitis 10/05/2020   Cough in adult 10/05/2020   S/P CABG x 3 04/27/2018   Angina pectoris ([place]) 04/24/2018   Obesity    Chronic combined systolic and diastolic congestive heart failure (HCC)    Peripheral edema 06/30/2017   Pedal edema 11/28/2016   Osteopenia 08/13/2015   Thrombocytopenia ([place]) 05/25/2015   Chronic ITP (idiopathic thrombocytopenia) ([place]) 05/25/2015   Hypothyroidism 08/29/2014   Diverticulitis of colon (without mention of hemorrhage)(562.11) 02/04/2014   Dysphagia, unspecified(787.20) 02/04/2014   Hyperlipidemia 11/18/2013   Dyspnea 01/02/2012   Palpitations 01/02/2012   Mixed hyperlipidemia 07/06/2011   Coronary artery disease involving native coronary artery of native heart with angina pectoris ([place])    TOTAL KNEE FOLLOW-UP 08/14/2008   Chronic back pain 07/23/2008   KNEE, ARTHRITIS, DEGEN./OSTEO 05/29/2008   JOINT EFFUSION, KNEE 04/30/2008   Pain in joint, lower leg 10/10/2007   Essential hypertension 10/09/2007   Guadelupe Sabin, OTR/L  [phone] 09/28/2021, 4:00 PM  [facility] [place], [place], [address] Phone: [phone]   Fax:  [phone]  Name: [name] MRN: [id number] Date of Birth: July 09, 1951

## 2021-09-29 ENCOUNTER — Ambulatory Visit (HOSPITAL_COMMUNITY): Payer: Medicare Other

## 2021-09-29 ENCOUNTER — Other Ambulatory Visit: Payer: Self-pay

## 2021-09-29 DIAGNOSIS — M6281 Muscle weakness (generalized): Secondary | ICD-10-CM

## 2021-09-29 DIAGNOSIS — R2689 Other abnormalities of gait and mobility: Secondary | ICD-10-CM | POA: Diagnosis not present

## 2021-09-29 DIAGNOSIS — R2681 Unsteadiness on feet: Secondary | ICD-10-CM | POA: Diagnosis not present

## 2021-09-29 DIAGNOSIS — R29818 Other symptoms and signs involving the nervous system: Secondary | ICD-10-CM | POA: Diagnosis not present

## 2021-09-29 DIAGNOSIS — R262 Difficulty in walking, not elsewhere classified: Secondary | ICD-10-CM

## 2021-09-29 DIAGNOSIS — G8250 Quadriplegia, unspecified: Secondary | ICD-10-CM | POA: Diagnosis not present

## 2021-09-29 DIAGNOSIS — R278 Other lack of coordination: Secondary | ICD-10-CM | POA: Diagnosis not present

## 2021-09-29 NOTE — Therapy (Signed)
[facility] [address] [place], [place], [address] Phone: [phone]   Fax:  [phone]  Physical Therapy Evaluation  Patient Details  Name: [name] MRN: [id number] Date of Birth: 05-27-1951 Referring Provider (PT): [name], NP   Encounter Date: 09/29/2021   PT End of Session - 09/29/21 1114     Visit Number 1    Number of Visits 12    Authorization Type UHC Medicare    Progress Note Due on Visit 10    PT Start Time 1115    PT Stop Time 1200    PT Time Calculation (min) 45 min    Equipment Utilized During Treatment Gait belt    Activity Tolerance Patient tolerated treatment well    Behavior During Therapy WFL for tasks assessed/performed             Past Medical History:  Diagnosis Date   Allergy    Carpal tunnel syndrome    Chronic back pain    Chronic combined systolic and diastolic congestive heart failure (HCC)    Chronic ITP (idiopathic thrombocytopenia) ([place]) 05/25/2015   Coronary artery disease    Coronary artery disease involving native coronary artery of native heart with angina pectoris ([place])    Degenerative disc disease    GERD (gastroesophageal reflux disease)    History of thrombocytopenia    Hypercholesteremia    Hypertension    Hypothyroid    Lumbar pain    Myocardial infarction ([place]) 2009   Obesity    Pneumonia    around age 10   S/P CABG x 3 04/27/2018   LIMA to LAD SVG to OM1 SVG to OM2   Shortness of breath dyspnea     Past Surgical History:  Procedure Laterality Date   ANTERIOR CERVICAL DECOMP/DISCECTOMY FUSION N/A 07/19/2021   Procedure: CERVICAL THREE-FOUR ANTERIOR CERVICAL DECOMPRESSION/DISCECTOMY FUSION;  Surgeon: [name], MD;  Location: [facility];  Service: Neurosurgery;  Laterality: N/A;   ANTERIOR CERVICAL DECOMP/DISCECTOMY FUSION N/A 07/22/2021   Procedure: ANTERIOR CERVICAL DECOMPRESSION/DISCECTOMY REVISON FUSION  WITH EVACUATION OF EPIDURAL HEMATOMA;  Surgeon:  [name], DO;  Location: [facility];  Service: Neurosurgery;  Laterality: N/A;   BACK SURGERY     5 lumbas disc with cervical and lumbar fusions   BIOPSY N/A 03/06/2014   Procedure: ESOPHAGEAL BIOPSIES;  Surgeon: [name], MD;  Location: [facility];  Service: Endoscopy;  Laterality: N/A;   COLONOSCOPY     COLONOSCOPY WITH PROPOFOL N/A 03/06/2014   Procedure: COLONOSCOPY WITH PROPOFOL;  Surgeon: [name], MD;  Location: [facility];  Service: Endoscopy;  Laterality: N/A;  in cecum at 0807; total withdrawal time 9 minutes   CORONARY ANGIOPLASTY WITH STENT PLACEMENT     2000, and 2004 has 3 stents   CORONARY ARTERY BYPASS GRAFT N/A 04/27/2018   Procedure: CORONARY ARTERY BYPASS GRAFTING (CABG) x Three , using left internal mammary artery and right leg greater saphenous vein;  Surgeon: [name], MD;  Location: [facility];  Service: Open Heart Surgery;  Laterality: N/A;   ESOPHAGOGASTRODUODENOSCOPY (EGD) WITH PROPOFOL N/A 03/06/2014   Procedure: ESOPHAGOGASTRODUODENOSCOPY (EGD) WITH PROPOFOL;  Surgeon: [name], MD;  [facility];  Service: Endoscopy;  Laterality: N/A;   LEFT HEART CATH AND CORONARY ANGIOGRAPHY N/A 04/24/2018   Procedure: LEFT HEART CATH AND CORONARY ANGIOGRAPHY;  Surgeon: [name], MD;  Location: [facility];  Service: Cardiovascular;  Laterality: N/A;  LUMBAR FUSION  2009   MALONEY DILATION N/A 03/06/2014   Procedure: MALONEY DILATION 54 french;  Surgeon: [name], MD;  [facility];  Service: Endoscopy;  Laterality: N/A;   NECK SURGERY     TEE WITHOUT CARDIOVERSION N/A 04/27/2018   Procedure: TRANSESOPHAGEAL ECHOCARDIOGRAM (TEE);  Surgeon: [name], MD;  Location: [facility];  Service: Open Heart Surgery;  Laterality: N/A;    There were no vitals filed for this visit.    Subjective Assessment - 09/29/21 1117     Subjective Underwent ACDF and sustained hematoma resulting in patrial quardiplegia. [place] with continued weakness     Pertinent History 70 y/o male who underwent C3/4 ACDF on 07/19/21 then presented back to the ED on 07/20/21 with cervical stenosis with right greater than left weakness due to C3-4 epidural hematoma; cervical myelopathy. Pt underwent C3/4 revision and evacuation of hematoma, Placement of intervertebral biomechanical device C3-4, globus interbody, and Placement of anterior instrumentation consisting of interbody plate and screws    Limitations Standing;Walking;House hold activities    Patient Stated Goals Be able to walk instead of using w/c    Currently in Pain? Yes    Pain Score 5     Pain Location Shoulder    Pain Orientation Right    Pain Descriptors / Indicators Aching;Sore    Pain Type Acute pain                [facility] PT Assessment - 09/29/21 0001       Assessment   Medical Diagnosis Acute incomplete quadriplegia    Referring Provider (PT) [name], NP      Precautions   Precautions Cervical;Fall    Required Braces or Orthoses Cervical Brace    Cervical Brace Soft collar;For comfort      Balance Screen   Has the patient fallen in the past 6 months Yes    How many times? 2    Has the patient had a decrease in activity level because of a fear of falling?  No    Is the patient reluctant to leave their home because of a fear of falling?  No      Home Environment   Living Environment Private residence    Living Arrangements Spouse/significant other    Type of [place] entrance    [place] to live on main level with bedroom/bathroom    [place] - 2 wheels;Cane - single point;Bedside commode;Wheelchair - manual      Prior Function   Level of Independence Independent    Vocation Retired    Leisure working on [employer], walking the dog, nature watching, piddling in Acupuncturist Impaired by gross assessment    Proprioception Impaired by gross assessment      Coordination   Gross Motor Movements  are Fluid and Coordinated No    Fine Motor Movements are Fluid and Coordinated No    Heel Shin Test impaired      Functional Tests   Functional tests Single leg stance;Sit to Stand;Step up      Step Up   Comments unable      Single Leg Stance   Comments unable      Sit to Stand   Comments modified independent      Tone   Assessment Location Right Lower Extremity      Strength   Strength  Assessment Site Hip;Knee;Ankle    Right Hip Flexion 3/5    Left Hip Flexion 4/5    Right Knee Flexion 3+/5    Right Knee Extension 3+/5    Left Knee Flexion 3+/5    Left Knee Extension 3+/5    Right Ankle Dorsiflexion 4-/5    Right Ankle Plantar Flexion 3/5    Left Ankle Dorsiflexion 4/5    Left Ankle Plantar Flexion 3+/5      Bed Mobility   Bed Mobility Rolling Right;Rolling Left;Supine to Sit;Sit to Supine    Rolling Right Independent with assistive device    Supine to Sit Independent with assistive device    Sit to Supine Independent with assistive device      Transfers   Transfers Sit to Stand;Stand Pivot Transfers    Sit to Stand 6: Modified independent (Device/Increase time)    Stand Pivot Transfers 6: Modified independent (Device/Increase time)      Ambulation/Gait   Ambulation/Gait Yes    Ambulation/Gait Assistance 4: Min guard    Ambulation Distance (Feet) 87 Feet    Assistive device Rolling walker    Gait Pattern Trunk flexed;Wide base of support;Decreased dorsiflexion - right    Stairs No      RLE Tone   RLE Tone Other (comment)   2-4 beat clonus right ankle                       Objective measurements completed on examination: See above findings.       [facility] Adult PT Treatment/Exercise - 09/29/21 0001       Exercises   Exercises Knee/Hip      Knee/Hip Exercises: Supine   Straight Leg Raises Strengthening;Both;1 set;5 reps                       PT Short Term Goals - 09/29/21 1207       PT SHORT TERM GOAL #1   Title  Patient will be able to complete 5x STS in under 15 seconds in order to reduce the risk of falls.    Time 3    Period Weeks    Status New    Target Date 10/20/21      PT SHORT TERM GOAL #2   Title Decrease risk for falls per TUG test time 12 sec with least restrictive AD    Time 3    Period Weeks    Status New    Target Date 10/20/21      PT SHORT TERM GOAL #3   Title Patient will report at least 25% improvement in symptoms for improved quality of life.    Time 3    Period Weeks    Status New    Target Date 10/20/21               PT Long Term Goals - 09/29/21 1211       PT LONG TERM GOAL #1   Title Demo 4/5 BLE strength to improve safety and stability for standing ADL and gait    Baseline 3 to 4-/5 gross strength    Time 6    Period Weeks    Status New    Target Date 11/10/21      PT LONG TERM GOAL #2   Title Demo modified independent ambulation x 350 ft to improve functional ambulation    Baseline 87 ft CGA with RW    Time 6  Period Weeks    Status New    Target Date 11/10/21      PT LONG TERM GOAL #3   Title Ascend/descend curb with least restrictive AD to improve safety with ambulation    Baseline DNT    Time 6    Period Weeks    Status New    Target Date 11/10/21                    Plan - 09/29/21 1204     Clinical Impression Statement 70 yo gentleman with recent onset of weakness and impaired mobility following cervical spine surgery and suffering hematoma presenting with motor and sensory disturbance affecting his functional independence and mobility.  PT services indicated to improve global strength, enhance coordination/motor control, facilitate independent ambulation, reduce risk for falls, and facilitate return to PLOF    Personal Factors and Comorbidities Age;Comorbidity 1;Time since onset of injury/illness/exacerbation    Comorbidities hx of multiple spine surgeries    Examination-Activity Limitations  Bathing;Bend;Carry;Lift;Toileting;Stand;Stairs;Squat;Reach Overhead;Locomotion Level;Transfers    Examination-Participation Restrictions Cleaning;Yard Work;Meal Prep;Driving;Community Activity    Stability/Clinical Decision Making Evolving/Moderate complexity    Clinical Decision Making Moderate    Rehab Potential Good    PT Frequency 2x / week    PT Duration 6 weeks    PT Treatment/Interventions ADLs/Self Care Home Management;Electrical Stimulation;DME Instruction;Gait training;Stair training;Functional mobility training;Therapeutic activities;Therapeutic exercise;Balance training;Patient/family education;Neuromuscular re-education;Wheelchair mobility training;Manual techniques;Dry needling    PT Next Visit Plan Body-weight support treadmill    PT Home Exercise Plan SLR    Consulted and Agree with Plan of Care Patient             Patient will benefit from skilled therapeutic intervention in order to improve the following deficits and impairments:  Abnormal gait, Decreased activity tolerance, Decreased balance, Decreased mobility, Decreased knowledge of use of DME, Decreased endurance, Decreased coordination, Decreased strength, Difficulty walking, Postural dysfunction, Improper body mechanics, Pain  Visit Diagnosis: Difficulty in walking, not elsewhere classified  Muscle weakness (generalized)  Other abnormalities of gait and mobility  Unsteadiness on feet     Problem List Patient Active Problem List   Diagnosis Date Noted   Cervical myelopathy ([place])    Hyponatremia    Neurogenic bladder    Pressure injury of skin 08/08/2021   Acute incomplete quadriplegia ([place]) 08/01/2021   Epidural hematoma 07/22/2021   Myelopathy ([place]) 07/19/2021   Callus 05/18/2021   Acute bacterial rhinosinusitis 10/05/2020   Cough in adult 10/05/2020   S/P CABG x 3 04/27/2018   Angina pectoris ([place]) 04/24/2018   Obesity    Chronic combined systolic and diastolic congestive heart failure ([place])     Peripheral edema 06/30/2017   Pedal edema 11/28/2016   Osteopenia 08/13/2015   Thrombocytopenia ([place]) 05/25/2015   Chronic ITP (idiopathic thrombocytopenia) ([place]) 05/25/2015   Hypothyroidism 08/29/2014   Diverticulitis of colon (without mention of hemorrhage)(562.11) 02/04/2014   Dysphagia, unspecified(787.20) 02/04/2014   Hyperlipidemia 11/18/2013   Dyspnea 01/02/2012   Palpitations 01/02/2012   Mixed hyperlipidemia 07/06/2011   Coronary artery disease involving native coronary artery of native heart with angina pectoris ([place])    TOTAL KNEE FOLLOW-UP 08/14/2008   Chronic back pain 07/23/2008   KNEE, ARTHRITIS, DEGEN./OSTEO 05/29/2008   JOINT EFFUSION, KNEE 04/30/2008   Pain in joint, lower leg 10/10/2007   Essential hypertension 10/09/2007    Toniann Fail, PT 09/29/2021, 12:15 PM  [facility] [place], [place], [address] Phone:  [phone]   Fax:  [phone]  Name: [name] MRN: [id number] Date of Birth: 06-Sep-1951

## 2021-09-30 ENCOUNTER — Ambulatory Visit (HOSPITAL_COMMUNITY): Payer: Medicare Other | Admitting: Physical Therapy

## 2021-09-30 ENCOUNTER — Telehealth (HOSPITAL_COMMUNITY): Payer: Self-pay | Admitting: Occupational Therapy

## 2021-09-30 ENCOUNTER — Ambulatory Visit (HOSPITAL_COMMUNITY): Payer: Medicare Other | Admitting: Occupational Therapy

## 2021-09-30 NOTE — Telephone Encounter (Signed)
Pt called at 1:20pm stating he had been given a water pill and there was not way he could be here today at 2:30pm. He was sorry and wanted to add them at the end of his schedule to make up for the missed apptments.

## 2021-10-05 ENCOUNTER — Ambulatory Visit (HOSPITAL_COMMUNITY): Payer: Medicare Other | Admitting: Occupational Therapy

## 2021-10-05 ENCOUNTER — Ambulatory Visit (HOSPITAL_COMMUNITY): Payer: Medicare Other | Attending: Registered Nurse

## 2021-10-05 ENCOUNTER — Other Ambulatory Visit: Payer: Self-pay

## 2021-10-05 ENCOUNTER — Encounter (HOSPITAL_COMMUNITY): Payer: Self-pay | Admitting: Occupational Therapy

## 2021-10-05 DIAGNOSIS — R262 Difficulty in walking, not elsewhere classified: Secondary | ICD-10-CM | POA: Insufficient documentation

## 2021-10-05 DIAGNOSIS — R278 Other lack of coordination: Secondary | ICD-10-CM | POA: Diagnosis not present

## 2021-10-05 DIAGNOSIS — R2681 Unsteadiness on feet: Secondary | ICD-10-CM | POA: Insufficient documentation

## 2021-10-05 DIAGNOSIS — M6281 Muscle weakness (generalized): Secondary | ICD-10-CM | POA: Insufficient documentation

## 2021-10-05 DIAGNOSIS — R29818 Other symptoms and signs involving the nervous system: Secondary | ICD-10-CM | POA: Insufficient documentation

## 2021-10-05 DIAGNOSIS — R2689 Other abnormalities of gait and mobility: Secondary | ICD-10-CM | POA: Diagnosis not present

## 2021-10-05 NOTE — Therapy (Signed)
[facility] [address] [place], [place], [address] Phone: [phone]   Fax:  [phone]  Occupational Therapy Treatment  Patient Details  Name: [name] MRN: [id number] Date of Birth: [birth year] Referring Provider (OT): Danella Sensing, NP   Encounter Date: 10/05/2021   OT End of Session - 10/05/21 1556     Visit Number 6    Number of Visits 16    Date for OT Re-Evaluation 11/13/21   mini-reassessment 10/12/21   Authorization Type UHC Medicare; $30 copay per discipline per visit    Authorization Time Period no visit limit    Progress Note Due on Visit 10    OT Start Time 1515    OT Stop Time 1557    OT Time Calculation (min) 42 min    Activity Tolerance Patient tolerated treatment well    Behavior During Therapy [facility] for tasks assessed/performed             Past Medical History:  Diagnosis Date   Allergy    Carpal tunnel syndrome    Chronic back pain    Chronic combined systolic and diastolic congestive heart failure (HCC)    Chronic ITP (idiopathic thrombocytopenia) ([place]) 05/25/2015   Coronary artery disease    Coronary artery disease involving native coronary artery of native heart with angina pectoris (HCC)    Degenerative disc disease    GERD (gastroesophageal reflux disease)    History of thrombocytopenia    Hypercholesteremia    Hypertension    Hypothyroid    Lumbar pain    Myocardial infarction ([place]) 2009   Obesity    Pneumonia    around age 14   S/P CABG x 3 04/27/2018   LIMA to LAD SVG to OM1 SVG to OM2   Shortness of breath dyspnea     Past Surgical History:  Procedure Laterality Date   ANTERIOR CERVICAL DECOMP/DISCECTOMY FUSION N/A 07/19/2021   Procedure: CERVICAL THREE-FOUR ANTERIOR CERVICAL DECOMPRESSION/DISCECTOMY FUSION;  Surgeon: [name], MD;  Location: [facility];  Service: Neurosurgery;  Laterality: N/A;   ANTERIOR CERVICAL DECOMP/DISCECTOMY FUSION N/A 07/22/2021   Procedure: ANTERIOR  CERVICAL DECOMPRESSION/DISCECTOMY REVISON FUSION  WITH EVACUATION OF EPIDURAL HEMATOMA;  Surgeon: [name], DO;  Location: [facility];  Service: Neurosurgery;  Laterality: N/A;   BACK SURGERY     5 lumbas disc with cervical and lumbar fusions   BIOPSY N/A 03/06/2014   Procedure: ESOPHAGEAL BIOPSIES;  Surgeon: [name], MD;  [facility];  Service: Endoscopy;  Laterality: N/A;   COLONOSCOPY     COLONOSCOPY WITH PROPOFOL N/A 03/06/2014   Procedure: COLONOSCOPY WITH PROPOFOL;  Surgeon: [name], MD;  [facility];  Service: Endoscopy;  Laterality: N/A;  in cecum at 0807; total withdrawal time 9 minutes   CORONARY ANGIOPLASTY WITH STENT PLACEMENT     2000, and 2004 has 3 stents   CORONARY ARTERY BYPASS GRAFT N/A 04/27/2018   Procedure: CORONARY ARTERY BYPASS GRAFTING (CABG) x Three , using left internal mammary artery and right leg greater saphenous vein;  Surgeon: [name], MD;  Location: [facility];  Service: Open Heart Surgery;  Laterality: N/A;   ESOPHAGOGASTRODUODENOSCOPY (EGD) WITH PROPOFOL N/A 03/06/2014   Procedure: ESOPHAGOGASTRODUODENOSCOPY (EGD) WITH PROPOFOL;  Surgeon: [name], MD;  [facility];  Service: Endoscopy;  Laterality: N/A;   LEFT HEART CATH AND CORONARY ANGIOGRAPHY N/A 04/24/2018   Procedure: LEFT HEART CATH AND CORONARY ANGIOGRAPHY;  Surgeon: Larae Grooms  S, MD;  Location: [facility];  Service: Cardiovascular;  Laterality: N/A;   LUMBAR FUSION  2009   MALONEY DILATION N/A 03/06/2014   Procedure: MALONEY DILATION 54 french;  Surgeon: [name], MD;  [facility];  Service: Endoscopy;  Laterality: N/A;   NECK SURGERY     TEE WITHOUT CARDIOVERSION N/A 04/27/2018   Procedure: TRANSESOPHAGEAL ECHOCARDIOGRAM (TEE);  Surgeon: [name], MD;  Location: [facility];  Service: Open Heart Surgery;  Laterality: N/A;    There were no vitals filed for this visit.   Subjective Assessment - 10/05/21 1517     Subjective  S: Some days  I feel more stiff than other days.    Currently in Pain? No/denies                [facility] OT Assessment - 10/05/21 1517       Assessment   Medical Diagnosis Acute incomplete quadriplegia      Precautions   Precautions Cervical;Fall    Required Braces or Orthoses Cervical Brace    Cervical Brace Soft collar;For comfort                      OT Treatments/Exercises (OP) - 10/05/21 1517       Exercises   Exercises Hand;Theraputty      Hand Exercises   Hand Gripper with Large Beads all beads gripper at 35#, vertical    Hand Gripper with Medium Beads all beads gripper at 29#, vertical    Other Hand Exercises Pt using pvc pipe to cut circles into red putty of various thicknesses. Working on sustained grip strength      Theraputty   Theraputty - Flatten red seated    Theraputty - Roll red    Theraputty - Grip red    Theraputty - Pinch red-lateral and 3 point      Neurological Re-education Exercises   Shoulder Flexion AROM;10 reps;Seated    Shoulder Protraction AROM;10 reps;Seated    Shoulder External Rotation AROM;10 reps;Seated    Shoulder Internal Rotation AROM;10 reps;Seated      Fine Motor Coordination (Hand/Wrist)   Fine Motor Coordination Small Pegboard    Small Pegboard Pt picking up pegs and placing into pegboard, one at a time. Mod difficulty however was able to place pegs with increased time. Pt placing some pegs independently and some with set-up assist                      OT Short Term Goals - 09/16/21 1426       OT SHORT TERM GOAL #1   Title Pt will be provided with and educated on HEP to improve mobility of BUE required for ADL task completion.    Time 4    Period Weeks    Status On-going    Target Date 10/14/21      OT SHORT TERM GOAL #2   Title Pt will demonstrate RUE A/ROM WFL to improve ability to reach overhead and behind back during dressing tasks.    Time 4    Period Weeks    Status On-going      OT SHORT TERM  GOAL #3   Title Pt will demonstrate improved fine motor coordination required for self-feeding by completing 9 hole peg test in under 1\' 15"  for the left hand and in under 6' for the right hand.    Time 4    Period Weeks  Status On-going      OT SHORT TERM GOAL #4   Title Pt will demonstrate improved grip strength of at least 35# required for holding items with right hand.    Time 4    Period Weeks    Status On-going               OT Long Term Goals - 09/16/21 1426       OT LONG TERM GOAL #1   Title Pt will increase RUE strength to 4/5 or greater to improve ability to use RUE as dominant during simple ADLs.    Time 8    Period Weeks    Status On-going      OT LONG TERM GOAL #2   Title Pt will increase right grip strength to 40# or greater and pinch strength to 12# or greater to improve ability to complete functional tasks requiring sustained grip on objects.    Time 8    Period Weeks    Status On-going      OT LONG TERM GOAL #3   Title Pt will increase fine motor coordination required for operating buttons or tying shoes by completing 9 hole peg test in under 1' with left hand and under 4' with right hand.    Time 8    Period Weeks    Status On-going      OT LONG TERM GOAL #4   Title Pt will be educated on AE available to faciliate independence in ADLs.    Time 8    Period Weeks    Status On-going                   Plan - 10/05/21 1559     Clinical Impression Statement A: Pt reports he is completing his HEP, believes fine motor tasks would be easier if he could feel more in his fingers. Continued with hand gripper, increasing to 35# resistance for large beads. Pt was able to come pegboard for fine motor work today, requiring set-up assist intermittently due to difficulty placing pegs once in his hand. Attempted seated A/ROM, pt limited to 50% ROM and fatigues quickly. Rest breaks provided as needed.    Body Structure / Function / Physical Skills  ADL;Endurance;UE functional use;Pain;ROM;IADL;Strength;Mobility;Coordination    Plan P: Continue with A/ROM and functional reaching, proximal shoulder strengthening. Trial small beads with hand gripper at 25#, continue with fine motor work    [place] 11/15: red theraputty exercises; 11/17: supine A/ROM; 11/21: hand A/ROM    Consulted and Agree with Plan of Care Patient             Patient will benefit from skilled therapeutic intervention in order to improve the following deficits and impairments:   Body Structure / Function / Physical Skills: ADL, Endurance, UE functional use, Pain, ROM, IADL, Strength, Mobility, Coordination       Visit Diagnosis: Other lack of coordination  Other symptoms and signs involving the nervous system    Problem List Patient Active Problem List   Diagnosis Date Noted   Cervical myelopathy ([place])    Hyponatremia    Neurogenic bladder    Pressure injury of skin 08/08/2021   Acute incomplete quadriplegia ([place]) 08/01/2021   Epidural hematoma 07/22/2021   Myelopathy ([place]) 07/19/2021   Callus 05/18/2021   Acute bacterial rhinosinusitis 10/05/2020   Cough in adult 10/05/2020   S/P CABG x 3 04/27/2018   Angina pectoris ([place]) 04/24/2018   Obesity  Chronic combined systolic and diastolic congestive heart failure (HCC)    Peripheral edema 06/30/2017   Pedal edema 11/28/2016   Osteopenia 08/13/2015   Thrombocytopenia ([place]) 05/25/2015   Chronic ITP (idiopathic thrombocytopenia) ([place]) 05/25/2015   Hypothyroidism 08/29/2014   Diverticulitis of colon (without mention of hemorrhage)(562.11) 02/04/2014   Dysphagia, unspecified(787.20) 02/04/2014   Hyperlipidemia 11/18/2013   Dyspnea 01/02/2012   Palpitations 01/02/2012   Mixed hyperlipidemia 07/06/2011   Coronary artery disease involving native coronary artery of native heart with angina pectoris ([place])    TOTAL KNEE FOLLOW-UP 08/14/2008   Chronic back pain 07/23/2008   KNEE, ARTHRITIS,  DEGEN./OSTEO 05/29/2008   JOINT EFFUSION, KNEE 04/30/2008   Pain in joint, lower leg 10/10/2007   Essential hypertension 10/09/2007    Guadelupe Sabin, OTR/L  [phone] 10/05/2021, 4:04 PM  [facility] [place], [place], [address] Phone: [phone]   Fax:  [phone]  Name: [name] MRN: [id number] Date of Birth: Dec 01, 1950

## 2021-10-05 NOTE — Therapy (Signed)
[facility] [address] [place], [place], [address] Phone: [phone]   Fax:  [phone]  Physical Therapy Treatment  Patient Details  Name: [name] MRN: [id number] Date of Birth: 1951/06/05 Referring Provider (PT): [name], NP   Encounter Date: 10/05/2021   PT End of Session - 10/05/21 1428     Visit Number 2    Number of Visits 12    Authorization Type UHC Medicare    Progress Note Due on Visit 10    PT Start Time 1430    PT Stop Time 1443    PT Time Calculation (min) 45 min    Equipment Utilized During Treatment Gait belt    Activity Tolerance Patient tolerated treatment well    Behavior During Therapy WFL for tasks assessed/performed             Past Medical History:  Diagnosis Date   Allergy    Carpal tunnel syndrome    Chronic back pain    Chronic combined systolic and diastolic congestive heart failure (HCC)    Chronic ITP (idiopathic thrombocytopenia) ([place]) 05/25/2015   Coronary artery disease    Coronary artery disease involving native coronary artery of native heart with angina pectoris ([place])    Degenerative disc disease    GERD (gastroesophageal reflux disease)    History of thrombocytopenia    Hypercholesteremia    Hypertension    Hypothyroid    Lumbar pain    Myocardial infarction ([place]) 2009   Obesity    Pneumonia    around age 60   S/P CABG x 3 04/27/2018   LIMA to LAD SVG to OM1 SVG to OM2   Shortness of breath dyspnea     Past Surgical History:  Procedure Laterality Date   ANTERIOR CERVICAL DECOMP/DISCECTOMY FUSION N/A 07/19/2021   Procedure: CERVICAL THREE-FOUR ANTERIOR CERVICAL DECOMPRESSION/DISCECTOMY FUSION;  Surgeon: Oscar Kos, MD;  Location: [facility];  Service: Neurosurgery;  Laterality: N/A;   ANTERIOR CERVICAL DECOMP/DISCECTOMY FUSION N/A 07/22/2021   Procedure: ANTERIOR CERVICAL DECOMPRESSION/DISCECTOMY REVISON FUSION  WITH EVACUATION OF EPIDURAL HEMATOMA;  Surgeon: Oscar Ro, DO;  Location: [facility];  Service: Neurosurgery;  Laterality: N/A;   BACK SURGERY     5 lumbas disc with cervical and lumbar fusions   BIOPSY N/A 03/06/2014   Procedure: ESOPHAGEAL BIOPSIES;  Surgeon: Oscar Houston, MD;  [facility];  Service: Endoscopy;  Laterality: N/A;   COLONOSCOPY     COLONOSCOPY WITH PROPOFOL N/A 03/06/2014   Procedure: COLONOSCOPY WITH PROPOFOL;  Surgeon: Oscar Houston, MD;  [facility]: [facility];  Service: Endoscopy;  Laterality: N/A;  in cecum at 0807; total withdrawal time 9 minutes   CORONARY ANGIOPLASTY WITH STENT PLACEMENT     2000, and 2004 has 3 stents   CORONARY ARTERY BYPASS GRAFT N/A 04/27/2018   Procedure: CORONARY ARTERY BYPASS GRAFTING (CABG) x Three , using left internal mammary artery and right leg greater saphenous vein;  Surgeon: Oscar Alberts, MD;  Location: [facility];  Service: Open Heart Surgery;  Laterality: N/A;   ESOPHAGOGASTRODUODENOSCOPY (EGD) WITH PROPOFOL N/A 03/06/2014   Procedure: ESOPHAGOGASTRODUODENOSCOPY (EGD) WITH PROPOFOL;  Surgeon: Oscar Houston, MD;  [facility];  Service: Endoscopy;  Laterality: N/A;   LEFT HEART CATH AND CORONARY ANGIOGRAPHY N/A 04/24/2018   Procedure: LEFT HEART CATH AND CORONARY ANGIOGRAPHY;  Surgeon: Oscar Booze, MD;  Location: [facility];  Service: Cardiovascular;  Laterality: N/A;  LUMBAR FUSION  2009   MALONEY DILATION N/A 03/06/2014   Procedure: MALONEY DILATION 54 french;  Surgeon: Oscar Houston, MD;  [facility];  Service: Endoscopy;  Laterality: N/A;   NECK SURGERY     TEE WITHOUT CARDIOVERSION N/A 04/27/2018   Procedure: TRANSESOPHAGEAL ECHOCARDIOGRAM (TEE);  Surgeon: Oscar Alberts, MD;  Location: [facility];  Service: Open Heart Surgery;  Laterality: N/A;    There were no vitals filed for this visit.   Subjective Assessment - 10/05/21 1518     Subjective No issues. Would like to improve walking/balance    Pertinent History 70 y/o male who underwent C3/4 ACDF on 07/19/21  then presented back to the ED on 07/20/21 with cervical stenosis with right greater than left weakness due to C3-4 epidural hematoma; cervical myelopathy. Pt underwent C3/4 revision and evacuation of hematoma, Placement of intervertebral biomechanical device C3-4, globus interbody, and Placement of anterior instrumentation consisting of interbody plate and screws    Limitations Standing;Walking;House hold activities    Patient Stated Goals Be able to walk instead of using w/c    Currently in Pain? Yes    Pain Score 5     Pain Location Shoulder    Pain Orientation Right    Pain Descriptors / Indicators Aching                [facility] PT Assessment - 10/05/21 0001       Sit to Stand   Comments 5xSTS: 20 sec with modification      Standardized Balance Assessment   Standardized Balance Assessment Timed Up and Go Test      Timed Up and Go Test   TUG Normal TUG    Normal TUG (seconds) 23    TUG Comments RW                           [facility] Adult PT Treatment/Exercise - 10/05/21 0001       Transfers   Five time sit to stand comments  20 sec with BUE support      Ambulation/Gait   Ambulation/Gait Yes    Ambulation/Gait Assistance 4: Min guard    Assistive device Body weight support system    Gait Pattern Trunk flexed;Wide base of support;Decreased dorsiflexion - right    Curb 4: Min assist    Curb Details (indicate cue type and reason) w/ RW and CGA for ascend/descend with cues    Gait Comments Body-weight support treadmill with speed from 0.8-1.4 mph with cues for assisting trunk extension and right foot clearance. Tolerating approx 2 min intervals with onset of fatigue and decreased RLE coordination. Tolerating 6 trials as such                       PT Short Term Goals - 10/05/21 1523       PT SHORT TERM GOAL #1   Title Patient will be able to complete 5x STS in under 15 seconds in order to reduce the risk of falls.    Baseline 20 sec with BUE  support    Time 3    Period Weeks    Status On-going    Target Date 10/20/21      PT SHORT TERM GOAL #2   Title Decrease risk for falls per TUG test time 12 sec with least restrictive AD    Baseline 23 sec w/RW    Time 3  Period Weeks    Status On-going    Target Date 10/20/21      PT SHORT TERM GOAL #3   Title Patient will report at least 25% improvement in symptoms for improved quality of life.    Time 3    Period Weeks    Status New    Target Date 10/20/21               PT Long Term Goals - 09/29/21 1211       PT LONG TERM GOAL #1   Title Demo 4/5 BLE strength to improve safety and stability for standing ADL and gait    Baseline 3 to 4-/5 gross strength    Time 6    Period Weeks    Status New    Target Date 11/10/21      PT LONG TERM GOAL #2   Title Demo modified independent ambulation x 350 ft to improve functional ambulation    Baseline 87 ft CGA with RW    Time 6    Period Weeks    Status New    Target Date 11/10/21      PT LONG TERM GOAL #3   Title Ascend/descend curb with least restrictive AD to improve safety with ambulation    Baseline DNT    Time 6    Period Weeks    Status New    Target Date 11/10/21                   Plan - 10/05/21 1518     Clinical Impression Statement Tolerating tx sessions well and progressing with gait training with onset of fatigue and decomposition of RLE control around 2 min.  Seated rest periods every 2 minutes. Demonstrating good safety awareness with transfers and curb negotiation. Continued sessions to improve motor control/coordination and progress functional ambulation    Personal Factors and Comorbidities Age;Comorbidity 1;Time since onset of injury/illness/exacerbation    Comorbidities hx of multiple spine surgeries    Examination-Activity Limitations Bathing;Bend;Carry;Lift;Toileting;Stand;Stairs;Squat;Reach Overhead;Locomotion Level;Transfers    Examination-Participation Restrictions  Cleaning;Yard Work;Meal Prep;Driving;Community Activity    Stability/Clinical Decision Making Evolving/Moderate complexity    Rehab Potential Good    PT Frequency 2x / week    PT Duration 6 weeks    PT Treatment/Interventions ADLs/Self Care Home Management;Electrical Stimulation;DME Instruction;Gait training;Stair training;Functional mobility training;Therapeutic activities;Therapeutic exercise;Balance training;Patient/family education;Neuromuscular re-education;Wheelchair mobility training;Manual techniques;Dry needling    PT Next Visit Plan 5xSTS test, TUG test, Body-weight support treadmill    PT Home Exercise Plan SLR    Consulted and Agree with Plan of Care Patient             Patient will benefit from skilled therapeutic intervention in order to improve the following deficits and impairments:  Abnormal gait, Decreased activity tolerance, Decreased balance, Decreased mobility, Decreased knowledge of use of DME, Decreased endurance, Decreased coordination, Decreased strength, Difficulty walking, Postural dysfunction, Improper body mechanics, Pain  Visit Diagnosis: Difficulty in walking, not elsewhere classified  Muscle weakness (generalized)  Other abnormalities of gait and mobility  Unsteadiness on feet     Problem List Patient Active Problem List   Diagnosis Date Noted   Cervical myelopathy ([facility])    Hyponatremia    Neurogenic bladder    Pressure injury of skin 08/08/2021   Acute incomplete quadriplegia ([facility]) 08/01/2021   Epidural hematoma 07/22/2021   Myelopathy ([facility]) 07/19/2021   Callus 05/18/2021   Acute bacterial rhinosinusitis 10/05/2020   Cough in adult 10/05/2020  S/P CABG x 3 04/27/2018   Angina pectoris ([facility]) 04/24/2018   Obesity    Chronic combined systolic and diastolic congestive heart failure ([facility])    Peripheral edema 06/30/2017   Pedal edema 11/28/2016   Osteopenia 08/13/2015   Thrombocytopenia ([facility]) 05/25/2015   Chronic ITP (idiopathic  thrombocytopenia) ([facility]) 05/25/2015   Hypothyroidism 08/29/2014   Diverticulitis of colon (without mention of hemorrhage)(562.11) 02/04/2014   Dysphagia, unspecified(787.20) 02/04/2014   Hyperlipidemia 11/18/2013   Dyspnea 01/02/2012   Palpitations 01/02/2012   Mixed hyperlipidemia 07/06/2011   Coronary artery disease involving native coronary artery of native heart with angina pectoris ([facility])    TOTAL KNEE FOLLOW-UP 08/14/2008   Chronic back pain 07/23/2008   KNEE, ARTHRITIS, DEGEN./OSTEO 05/29/2008   JOINT EFFUSION, KNEE 04/30/2008   Pain in joint, lower leg 10/10/2007   Essential hypertension 10/09/2007    Toniann Fail, PT 10/05/2021, 3:24 PM  [facility] [place], [place], [address] Phone: [phone]   Fax:  [phone]  Name: DERREK PUFF MRN: [id number] Date of Birth: November 05, 1950

## 2021-10-07 ENCOUNTER — Ambulatory Visit: Payer: Medicare Other | Admitting: Urology

## 2021-10-08 ENCOUNTER — Ambulatory Visit (HOSPITAL_COMMUNITY): Payer: Medicare Other

## 2021-10-08 ENCOUNTER — Ambulatory Visit (HOSPITAL_COMMUNITY): Payer: Medicare Other | Admitting: Occupational Therapy

## 2021-10-12 ENCOUNTER — Ambulatory Visit (HOSPITAL_COMMUNITY): Payer: Medicare Other

## 2021-10-12 ENCOUNTER — Encounter (HOSPITAL_COMMUNITY): Payer: Self-pay

## 2021-10-12 ENCOUNTER — Other Ambulatory Visit: Payer: Self-pay

## 2021-10-12 ENCOUNTER — Ambulatory Visit (HOSPITAL_COMMUNITY): Payer: Medicare Other | Admitting: Occupational Therapy

## 2021-10-12 ENCOUNTER — Encounter (HOSPITAL_COMMUNITY): Payer: Self-pay | Admitting: Occupational Therapy

## 2021-10-12 DIAGNOSIS — R278 Other lack of coordination: Secondary | ICD-10-CM

## 2021-10-12 DIAGNOSIS — R262 Difficulty in walking, not elsewhere classified: Secondary | ICD-10-CM | POA: Diagnosis not present

## 2021-10-12 DIAGNOSIS — R2689 Other abnormalities of gait and mobility: Secondary | ICD-10-CM

## 2021-10-12 DIAGNOSIS — R29818 Other symptoms and signs involving the nervous system: Secondary | ICD-10-CM | POA: Diagnosis not present

## 2021-10-12 DIAGNOSIS — R2681 Unsteadiness on feet: Secondary | ICD-10-CM | POA: Diagnosis not present

## 2021-10-12 DIAGNOSIS — M6281 Muscle weakness (generalized): Secondary | ICD-10-CM | POA: Diagnosis not present

## 2021-10-12 NOTE — Therapy (Addendum)
[name], [place], [address] Phone: [phone]   Fax:  [phone]  Occupational Therapy Treatment  Patient Details  Name: [name] MRN: [id number] Date of Birth: [birth year] Referring Provider (OT): Danella Sensing, NP   Progress Note Reporting Period 09/14/21 to 10/12/21  See note below for Objective Data and Assessment of Progress/Goals.      Encounter Date: 10/12/2021   OT End of Session - 10/12/21 1550     Visit Number 7    Number of Visits 16    Date for OT Re-Evaluation 11/13/21    Authorization Type UHC Medicare; $30 copay per discipline per visit    Authorization Time Period no visit limit    Progress Note Due on Visit 10    OT Start Time 1509    OT Stop Time 1550    OT Time Calculation (min) 41 min    Activity Tolerance Patient tolerated treatment well    Behavior During Therapy WFL for tasks assessed/performed             Past Medical History:  Diagnosis Date   Allergy    Carpal tunnel syndrome    Chronic back pain    Chronic combined systolic and diastolic congestive heart failure (HCC)    Chronic ITP (idiopathic thrombocytopenia) ([facility]) 05/25/2015   Coronary artery disease    Coronary artery disease involving native coronary artery of native heart with angina pectoris (HCC)    Degenerative disc disease    GERD (gastroesophageal reflux disease)    History of thrombocytopenia    Hypercholesteremia    Hypertension    Hypothyroid    Lumbar pain    Myocardial infarction ([facility]) 2009   Obesity    Pneumonia    around age 70   S/P CABG x 3 04/27/2018   LIMA to LAD SVG to OM1 SVG to OM2   Shortness of breath dyspnea     Past Surgical History:  Procedure Laterality Date   ANTERIOR CERVICAL DECOMP/DISCECTOMY FUSION N/A 07/19/2021   Procedure: CERVICAL THREE-FOUR ANTERIOR CERVICAL DECOMPRESSION/DISCECTOMY FUSION;  Surgeon: [name], MD;  Location: [facility];  Service: Neurosurgery;   Laterality: N/A;   ANTERIOR CERVICAL DECOMP/DISCECTOMY FUSION N/A 07/22/2021   Procedure: ANTERIOR CERVICAL DECOMPRESSION/DISCECTOMY REVISON FUSION  WITH EVACUATION OF EPIDURAL HEMATOMA;  Surgeon: [name], DO;  Location: [facility];  Service: Neurosurgery;  Laterality: N/A;   BACK SURGERY     5 lumbas disc with cervical and lumbar fusions   BIOPSY N/A 03/06/2014   Procedure: ESOPHAGEAL BIOPSIES;  Surgeon: [name], MD;  [facility];  Service: Endoscopy;  Laterality: N/A;   COLONOSCOPY     COLONOSCOPY WITH PROPOFOL N/A 03/06/2014   Procedure: COLONOSCOPY WITH PROPOFOL;  Surgeon: [name], MD;  [facility];  Service: Endoscopy;  Laterality: N/A;  in cecum at 0807; total withdrawal time 9 minutes   CORONARY ANGIOPLASTY WITH STENT PLACEMENT     2000, and 2004 has 3 stents   CORONARY ARTERY BYPASS GRAFT N/A 04/27/2018   Procedure: CORONARY ARTERY BYPASS GRAFTING (CABG) x Three , using left internal mammary artery and right leg greater saphenous vein;  Surgeon: [name], MD;  Location: [facility];  Service: Open Heart Surgery;  Laterality: N/A;   ESOPHAGOGASTRODUODENOSCOPY (EGD) WITH PROPOFOL N/A 03/06/2014   Procedure: ESOPHAGOGASTRODUODENOSCOPY (EGD) WITH PROPOFOL;  Surgeon: [name], MD;  [facility];  Service: Endoscopy;  Laterality: N/A;   LEFT  HEART CATH AND CORONARY ANGIOGRAPHY N/A 04/24/2018   Procedure: LEFT HEART CATH AND CORONARY ANGIOGRAPHY;  Surgeon: [name], MD;  Location: [facility];  Service: Cardiovascular;  Laterality: N/A;   LUMBAR FUSION  2009   MALONEY DILATION N/A 03/06/2014   Procedure: MALONEY DILATION 54 french;  Surgeon: [name], MD;  [facility];  Service: Endoscopy;  Laterality: N/A;   NECK SURGERY     TEE WITHOUT CARDIOVERSION N/A 04/27/2018   Procedure: TRANSESOPHAGEAL ECHOCARDIOGRAM (TEE);  Surgeon: [name], MD;  Location: [facility];  Service: Open Heart Surgery;  Laterality: N/A;    There were no  vitals filed for this visit.   Subjective Assessment - 10/12/21 1509     Subjective  S: My neck is a little sore.    Currently in Pain? Yes    Pain Score 6     Pain Location Neck    Pain Orientation Posterior    Pain Descriptors / Indicators Aching;Sore    Pain Type Acute pain    Pain Radiating Towards N/A    Pain Onset In the past 7 days    Pain Frequency Intermittent    Aggravating Factors  increased use    Pain Relieving Factors rest, wearing collar    Effect of Pain on Daily Activities min effect on on ADLs    Multiple Pain Sites No                [facility] OT Assessment - 10/12/21 1509       Assessment   Medical Diagnosis Acute incomplete quadriplegia      Precautions   Precautions Cervical;Fall    Required Braces or Orthoses Cervical Brace    Cervical Brace Soft collar;For comfort      Coordination   Right 9 Hole Peg Test 1'49"   7'30" previous   Left 9 Hole Peg Test 1'25"   1'46" previous     AROM   AROM Assessment Site Shoulder    Right/Left Shoulder Right    Right Shoulder Flexion 86 Degrees   76 previous   Right Shoulder ABduction 80 Degrees   56 previous   Right Shoulder Internal Rotation 90 Degrees   same as previous   Right Shoulder External Rotation 52 Degrees   same as previous     Strength   Right Shoulder Flexion 3-/5   same as previous   Right Shoulder ABduction 3-/5   same as previous   Right Shoulder Internal Rotation 4+/5   same as previous   Right Shoulder External Rotation 3-/5   same as previous   Right Elbow Flexion 4+/5   same as previous   Right Elbow Extension 4+/5   3+/5 previous   Right/Left Forearm Right    Right Forearm Pronation 4-/5   3+/5 previous   Right Forearm Supination 3+/5   3/5 previous   Right/Left Wrist Right    Right Wrist Flexion 4+/5   3/5 previous   Right Wrist Extension 5/5   3/5 previous   Right Wrist Radial Deviation 3/5   same as previous   Right Wrist Ulnar Deviation 3/5   same as previous   Right Hand  Gross Grasp Functional    Right Hand Grip (lbs) 75   28 previous   Right Hand Lateral Pinch 17 lbs   13 previous   Right Hand 3 Point Pinch 16 lbs   10 previous  OT Treatments/Exercises (OP) - 10/12/21 1510       Exercises   Exercises Hand;Theraputty      Neurological Re-education Exercises   Shoulder Flexion AAROM;10 reps;Seated    Shoulder ABduction AAROM;10 reps;Seated    Shoulder Protraction AAROM;10 reps;Seated    Shoulder Horizontal ABduction AAROM;10 reps;Seated    Shoulder External Rotation AROM;10 reps;Seated    Shoulder Internal Rotation AROM;10 reps;Seated    Elbow Flexion Strengthening;10 reps   3# weight-bicep curl, hammer curl, pronated     Fine Motor Coordination (Hand/Wrist)   Fine Motor Coordination Grooved pegs    Grooved pegs Attempted with both hands, pt placing 5 pegs with left hand, 4 pegs with right hand. Increased time for each hand.                      OT Short Term Goals - 10/12/21 1521       OT SHORT TERM GOAL #1   Title Pt will be provided with and educated on HEP to improve mobility of BUE required for ADL task completion.    Time 4    Period Weeks    Status On-going    Target Date 10/14/21      OT SHORT TERM GOAL #2   Title Pt will demonstrate RUE A/ROM WFL to improve ability to reach overhead and behind back during dressing tasks.    Time 4    Period Weeks    Status On-going      OT SHORT TERM GOAL #3   Title Pt will demonstrate improved fine motor coordination required for self-feeding by completing 9 hole peg test in under 1' 15"  for the left hand and in under 6' for the right hand.    Baseline 12/13: right hand 1'46", left hand 1' 25"     Time 4    Period Weeks    Status Partially Met      OT SHORT TERM GOAL #4   Title Pt will demonstrate improved grip strength of at least 35# required for holding items with right hand.    Time 4    Period Weeks    Status Achieved                OT Long Term Goals - 10/12/21 1522       OT LONG TERM GOAL #1   Title Pt will increase RUE strength to 4/5 or greater to improve ability to use RUE as dominant during simple ADLs.    Time 8    Period Weeks    Status On-going      OT LONG TERM GOAL #2   Title Pt will increase right grip strength to 40# or greater and pinch strength to 12# or greater to improve ability to complete functional tasks requiring sustained grip on objects.    Time 8    Period Weeks    Status Achieved      OT LONG TERM GOAL #3   Title Pt will increase fine motor coordination required for operating buttons or tying shoes by completing 9 hole peg test in under 1' with left hand and under 4' with right hand.    Baseline 12/13: right hand 1' 49" , left hand 1' 25"     Time 8    Period Weeks    Status Revised      OT LONG TERM GOAL #4   Title Pt will be educated on AE available to faciliate independence in ADLs.    Time  8    Period Weeks    Status On-going                   Plan - 10/12/21 1531     Clinical Impression Statement A: Mini-reassessment completed this session, pt has met 1 STG and 1 LTG and has partially met an additional STG and LTG. Pt's LTG for coordination has been revised as pt met his goal and has potential to further progress his coordination. Added AA/ROM in sitting today with improved form and activity tolerance, rest breaks provided as needed. Pt also completing elbow strengthening and fine motor work-added left hand during fine motor task. Max difficulty with grooved pegboard activity with each hand.    Body Structure / Function / Physical Skills ADL;Endurance;UE functional use;Pain;ROM;IADL;Strength;Mobility;Coordination    Plan P: Follow up on HEP, begin session with shoulder work in supine completing myofascial release and then progress to sitting.    OT Home Exercise Plan 11/15: red theraputty exercises; 11/17: supine A/ROM; 11/21: hand A/ROM; 12/13: seated AA/ROM    Consulted  and Agree with Plan of Care Patient             Patient will benefit from skilled therapeutic intervention in order to improve the following deficits and impairments:   Body Structure / Function / Physical Skills: ADL, Endurance, UE functional use, Pain, ROM, IADL, Strength, Mobility, Coordination       Visit Diagnosis: Other lack of coordination  Other symptoms and signs involving the nervous system    Problem List Patient Active Problem List   Diagnosis Date Noted   Cervical myelopathy ([facility])    Hyponatremia    Neurogenic bladder    Pressure injury of skin 08/08/2021   Acute incomplete quadriplegia ([facility]) 08/01/2021   Epidural hematoma 07/22/2021   Myelopathy ([facility]) 07/19/2021   Callus 05/18/2021   Acute bacterial rhinosinusitis 10/05/2020   Cough in adult 10/05/2020   S/P CABG x 3 04/27/2018   Angina pectoris ([facility]) 04/24/2018   Obesity    Chronic combined systolic and diastolic congestive heart failure (HCC)    Peripheral edema 06/30/2017   Pedal edema 11/28/2016   Osteopenia 08/13/2015   Thrombocytopenia ([facility]) 05/25/2015   Chronic ITP (idiopathic thrombocytopenia) ([facility]) 05/25/2015   Hypothyroidism 08/29/2014   Diverticulitis of colon (without mention of hemorrhage)(562.11) 02/04/2014   Dysphagia, unspecified(787.20) 02/04/2014   Hyperlipidemia 11/18/2013   Dyspnea 01/02/2012   Palpitations 01/02/2012   Mixed hyperlipidemia 07/06/2011   Coronary artery disease involving native coronary artery of native heart with angina pectoris ([facility])    TOTAL KNEE FOLLOW-UP 08/14/2008   Chronic back pain 07/23/2008   KNEE, ARTHRITIS, DEGEN./OSTEO 05/29/2008   JOINT EFFUSION, KNEE 04/30/2008   Pain in joint, lower leg 10/10/2007   Essential hypertension 10/09/2007    Guadelupe Sabin, OTR/L  [phone] 10/12/2021, 3:53 PM  [facility] [place], [place], [address] Phone: [phone]   Fax:  [phone]  Name:  DAREN YEAGLE MRN: [id number] Date of Birth: 01-31-1951

## 2021-10-12 NOTE — Therapy (Signed)
[facility] [address] [place], [place], [address] Phone: [phone]   Fax:  [phone]  Physical Therapy Treatment  Patient Details  Name: [name] MRN: [id number] Date of Birth: 04/05/1951 Referring Provider (PT): [name], NP   Encounter Date: 10/12/2021   PT End of Session - 10/12/21 1626     Visit Number 3    Number of Visits 12    Authorization Type UHC Medicare    Progress Note Due on Visit 10    PT Start Time 0093    PT Stop Time 8182    PT Time Calculation (min) 42 min    Equipment Utilized During Treatment Gait belt    Activity Tolerance Patient tolerated treatment well    Behavior During Therapy WFL for tasks assessed/performed             Past Medical History:  Diagnosis Date   Allergy    Carpal tunnel syndrome    Chronic back pain    Chronic combined systolic and diastolic congestive heart failure (HCC)    Chronic ITP (idiopathic thrombocytopenia) ([facility]) 05/25/2015   Coronary artery disease    Coronary artery disease involving native coronary artery of native heart with angina pectoris ([facility])    Degenerative disc disease    GERD (gastroesophageal reflux disease)    History of thrombocytopenia    Hypercholesteremia    Hypertension    Hypothyroid    Lumbar pain    Myocardial infarction ([facility]) 2009   Obesity    Pneumonia    around age 75   S/P CABG x 3 04/27/2018   LIMA to LAD SVG to OM1 SVG to OM2   Shortness of breath dyspnea     Past Surgical History:  Procedure Laterality Date   ANTERIOR CERVICAL DECOMP/DISCECTOMY FUSION N/A 07/19/2021   Procedure: CERVICAL THREE-FOUR ANTERIOR CERVICAL DECOMPRESSION/DISCECTOMY FUSION;  Surgeon: [name], MD;  Location: [facility];  Service: Neurosurgery;  Laterality: N/A;   ANTERIOR CERVICAL DECOMP/DISCECTOMY FUSION N/A 07/22/2021   Procedure: ANTERIOR CERVICAL DECOMPRESSION/DISCECTOMY REVISON FUSION  WITH EVACUATION OF EPIDURAL HEMATOMA;  Surgeon:  [name], DO;  Location: [facility];  Service: Neurosurgery;  Laterality: N/A;   BACK SURGERY     5 lumbas disc with cervical and lumbar fusions   BIOPSY N/A 03/06/2014   Procedure: ESOPHAGEAL BIOPSIES;  Surgeon: [name], MD;  [facility];  Service: Endoscopy;  Laterality: N/A;   COLONOSCOPY     COLONOSCOPY WITH PROPOFOL N/A 03/06/2014   Procedure: COLONOSCOPY WITH PROPOFOL;  Surgeon: [name], MD;  Location: [facility];  Service: Endoscopy;  Laterality: N/A;  in cecum at 0807; total withdrawal time 9 minutes   CORONARY ANGIOPLASTY WITH STENT PLACEMENT     2000, and 2004 has 3 stents   CORONARY ARTERY BYPASS GRAFT N/A 04/27/2018   Procedure: CORONARY ARTERY BYPASS GRAFTING (CABG) x Three , using left internal mammary artery and right leg greater saphenous vein;  Surgeon: [name], MD;  Location: [facility];  Service: Open Heart Surgery;  Laterality: N/A;   ESOPHAGOGASTRODUODENOSCOPY (EGD) WITH PROPOFOL N/A 03/06/2014   Procedure: ESOPHAGOGASTRODUODENOSCOPY (EGD) WITH PROPOFOL;  Surgeon: [name], MD;  [facility];  Service: Endoscopy;  Laterality: N/A;   LEFT HEART CATH AND CORONARY ANGIOGRAPHY N/A 04/24/2018   Procedure: LEFT HEART CATH AND CORONARY ANGIOGRAPHY;  Surgeon: [name], MD;  Location: [facility];  Service: Cardiovascular;  Laterality: N/A;  LUMBAR FUSION  2009   MALONEY DILATION N/A 03/06/2014   Procedure: MALONEY DILATION 54 french;  Surgeon: [name], MD;  [facility];  Service: Endoscopy;  Laterality: N/A;   NECK SURGERY     TEE WITHOUT CARDIOVERSION N/A 04/27/2018   Procedure: TRANSESOPHAGEAL ECHOCARDIOGRAM (TEE);  Surgeon: [name], MD;  Location: [facility];  Service: Open Heart Surgery;  Laterality: N/A;    There were no vitals filed for this visit.   Subjective Assessment - 10/12/21 1623     Subjective Pt stated he has some Rt LE pain 5/10 soreness    Pertinent History 71 y/o male who underwent C3/4 ACDF on  07/19/21 then presented back to the ED on 07/20/21 with cervical stenosis with right greater than left weakness due to C3-4 epidural hematoma; cervical myelopathy. Pt underwent C3/4 revision and evacuation of hematoma, Placement of intervertebral biomechanical device C3-4, globus interbody, and Placement of anterior instrumentation consisting of interbody plate and screws    Patient Stated Goals Be able to walk instead of using w/c; 12/13: ability to get up from floor    Currently in Pain? Yes    Pain Score 5     Pain Location Hip    Pain Orientation Right    Pain Descriptors / Indicators Aching;Sore    Pain Type Acute pain    Pain Onset In the past 7 days    Pain Frequency Intermittent    Aggravating Factors  laying in bed, increased use    Pain Relieving Factors rest, wearing collar    Effect of Pain on Daily Activities min effect on ADLs                               [facility] Adult PT Treatment/Exercise - 10/12/21 1629       Ambulation/Gait   Ambulation/Gait Yes    Ambulation/Gait Assistance 4: Min guard    Ambulation Distance (Feet) 20 Feet    Assistive device Rolling walker   WC behind for safety   Gait Pattern Trunk flexed;Wide base of support;Decreased dorsiflexion - right    Curb --    Curb Details (indicate cue type and reason) RW wiht CGA WC behind for safety    Gait Comments Body-weight support treadmill with speed from 1.2-1.4 mph with cues for assisting trunk extension and right foot clearance. Tolerating approx 2 min intervals with onset of fatigue and decreased RLE coordination. Tolerating 3 trials today               Knee/Hip Exercises: Seated   Sit to Sand 2 sets;5 reps;without UE support   elevated surface, cueing for erect posture     Knee/Hip Exercises: Supine   Bridges 3 sets;5 reps      Knee/Hip Exercises: Sidelying   Clams 10                       PT Short Term Goals - 10/05/21 1523       PT SHORT TERM GOAL #1   Title  Patient will be able to complete 5x STS in under 15 seconds in order to reduce the risk of falls.    Baseline 20 sec with BUE support    Time 3    Period Weeks    Status On-going    Target Date 10/20/21      PT SHORT TERM GOAL #2   Title Decrease risk  for falls per TUG test time 12 sec with least restrictive AD    Baseline 23 sec w/RW    Time 3    Period Weeks    Status On-going    Target Date 10/20/21      PT SHORT TERM GOAL #3   Title Patient will report at least 25% improvement in symptoms for improved quality of life.    Time 3    Period Weeks    Status New    Target Date 10/20/21               PT Long Term Goals - 09/29/21 1211       PT LONG TERM GOAL #1   Title Demo 4/5 BLE strength to improve safety and stability for standing ADL and gait    Baseline 3 to 4-/5 gross strength    Time 6    Period Weeks    Status New    Target Date 11/10/21      PT LONG TERM GOAL #2   Title Demo modified independent ambulation x 350 ft to improve functional ambulation    Baseline 87 ft CGA with RW    Time 6    Period Weeks    Status New    Target Date 11/10/21      PT LONG TERM GOAL #3   Title Ascend/descend curb with least restrictive AD to improve safety with ambulation    Baseline DNT    Time 6    Period Weeks    Status New    Target Date 11/10/21                   Plan - 10/12/21 1726     Clinical Impression Statement Added hip strengthening exercises to HEP, pt able to complete with min cueing to improve form for LE stability, printout given.  Gait training complete with RW as well as on treadmill for gait training to improve foot clearance with gait.  Pt tolerated well to session with no reports of pain.  Was limited by fatigue and required seated rest break following 2 minutes    Personal Factors and Comorbidities Age;Comorbidity 1;Time since onset of injury/illness/exacerbation    Comorbidities hx of multiple spine surgeries    Examination-Activity  Limitations Bathing;Bend;Carry;Lift;Toileting;Stand;Stairs;Squat;Reach Overhead;Locomotion Level;Transfers    Examination-Participation Restrictions Cleaning;Yard Work;Meal Prep;Driving;Community Activity    Stability/Clinical Decision Making Evolving/Moderate complexity    Clinical Decision Making Moderate    Rehab Potential Good    PT Frequency 2x / week    PT Duration 6 weeks    PT Treatment/Interventions ADLs/Self Care Home Management;Electrical Stimulation;DME Instruction;Gait training;Stair training;Functional mobility training;Therapeutic activities;Therapeutic exercise;Balance training;Patient/family education;Neuromuscular re-education;Wheelchair mobility training;Manual techniques;Dry needling    PT Next Visit Plan Body-weight support treadmill, progress gait, balance and functional hip strengthening.    PT Home Exercise Plan SLR; 12/13: bridge and clam    Consulted and Agree with Plan of Care Patient             Patient will benefit from skilled therapeutic intervention in order to improve the following deficits and impairments:  Abnormal gait, Decreased activity tolerance, Decreased balance, Decreased mobility, Decreased knowledge of use of DME, Decreased endurance, Decreased coordination, Decreased strength, Difficulty walking, Postural dysfunction, Improper body mechanics, Pain  Visit Diagnosis: Difficulty in walking, not elsewhere classified  Muscle weakness (generalized)  Other abnormalities of gait and mobility  Unsteadiness on feet     Problem List Patient Active Problem List   Diagnosis  Date Noted   Cervical myelopathy ([facility])    Hyponatremia    Neurogenic bladder    Pressure injury of skin 08/08/2021   Acute incomplete quadriplegia ([facility]) 08/01/2021   Epidural hematoma 07/22/2021   Myelopathy ([facility]) 07/19/2021   Callus 05/18/2021   Acute bacterial rhinosinusitis 10/05/2020   Cough in adult 10/05/2020   S/P CABG x 3 04/27/2018   Angina pectoris ([facility])  04/24/2018   Obesity    Chronic combined systolic and diastolic congestive heart failure ([facility])    Peripheral edema 06/30/2017   Pedal edema 11/28/2016   Osteopenia 08/13/2015   Thrombocytopenia ([facility]) 05/25/2015   Chronic ITP (idiopathic thrombocytopenia) ([facility]) 05/25/2015   Hypothyroidism 08/29/2014   Diverticulitis of colon (without mention of hemorrhage)(562.11) 02/04/2014   Dysphagia, unspecified(787.20) 02/04/2014   Hyperlipidemia 11/18/2013   Dyspnea 01/02/2012   Palpitations 01/02/2012   Mixed hyperlipidemia 07/06/2011   Coronary artery disease involving native coronary artery of native heart with angina pectoris ([facility])    TOTAL KNEE FOLLOW-UP 08/14/2008   Chronic back pain 07/23/2008   KNEE, ARTHRITIS, DEGEN./OSTEO 05/29/2008   JOINT EFFUSION, KNEE 04/30/2008   Pain in joint, lower leg 10/10/2007   Essential hypertension 10/09/2007   Ihor Austin, LPTA/CLT; CBIS [phone]  Aldona Lento, PTA 10/12/2021, 7:00 PM  [facility] [address] [place], [place], [address] Phone: [phone]   Fax:  [phone]  Name: [name] MRN: [id number] Date of Birth: November 07, 1950

## 2021-10-14 ENCOUNTER — Encounter (HOSPITAL_COMMUNITY): Payer: Self-pay | Admitting: Occupational Therapy

## 2021-10-14 ENCOUNTER — Other Ambulatory Visit: Payer: Self-pay

## 2021-10-14 ENCOUNTER — Ambulatory Visit (HOSPITAL_COMMUNITY): Payer: Medicare Other

## 2021-10-14 ENCOUNTER — Encounter (HOSPITAL_COMMUNITY): Payer: Self-pay

## 2021-10-14 ENCOUNTER — Ambulatory Visit (HOSPITAL_COMMUNITY): Payer: Medicare Other | Admitting: Occupational Therapy

## 2021-10-14 DIAGNOSIS — R262 Difficulty in walking, not elsewhere classified: Secondary | ICD-10-CM | POA: Diagnosis not present

## 2021-10-14 DIAGNOSIS — R2681 Unsteadiness on feet: Secondary | ICD-10-CM | POA: Diagnosis not present

## 2021-10-14 DIAGNOSIS — R2689 Other abnormalities of gait and mobility: Secondary | ICD-10-CM

## 2021-10-14 DIAGNOSIS — R278 Other lack of coordination: Secondary | ICD-10-CM

## 2021-10-14 DIAGNOSIS — R29818 Other symptoms and signs involving the nervous system: Secondary | ICD-10-CM | POA: Diagnosis not present

## 2021-10-14 DIAGNOSIS — M6281 Muscle weakness (generalized): Secondary | ICD-10-CM | POA: Diagnosis not present

## 2021-10-14 NOTE — Therapy (Signed)
[facility] [address] [place], [place], [address] Phone: [phone]   Fax:  [phone]  Occupational Therapy Treatment  Patient Details  Name: KAMDYN COLBORN MRN: [id number] Date of Birth: 08-23-1951 Referring Provider (OT): Danella Sensing, NP   Encounter Date: 10/14/2021   OT End of Session - 10/14/21 1712     Visit Number 8    Number of Visits 16    Date for OT Re-Evaluation 11/13/21    Authorization Type UHC Medicare; $30 copay per discipline per visit    Authorization Time Period no visit limit    Progress Note Due on Visit 34    OT Start Time 1433    OT Stop Time 1512    OT Time Calculation (min) 39 min    Activity Tolerance Patient tolerated treatment well    Behavior During Therapy [facility] for tasks assessed/performed             Past Medical History:  Diagnosis Date   Allergy    Carpal tunnel syndrome    Chronic back pain    Chronic combined systolic and diastolic congestive heart failure (HCC)    Chronic ITP (idiopathic thrombocytopenia) ([facility]) 05/25/2015   Coronary artery disease    Coronary artery disease involving native coronary artery of native heart with angina pectoris (HCC)    Degenerative disc disease    GERD (gastroesophageal reflux disease)    History of thrombocytopenia    Hypercholesteremia    Hypertension    Hypothyroid    Lumbar pain    Myocardial infarction ([facility]) 2009   Obesity    Pneumonia    around age 27   S/P CABG x 3 04/27/2018   LIMA to LAD SVG to OM1 SVG to OM2   Shortness of breath dyspnea     Past Surgical History:  Procedure Laterality Date   ANTERIOR CERVICAL DECOMP/DISCECTOMY FUSION N/A 07/19/2021   Procedure: CERVICAL THREE-FOUR ANTERIOR CERVICAL DECOMPRESSION/DISCECTOMY FUSION;  Surgeon: [name], MD;  Location: [facility];  Service: Neurosurgery;  Laterality: N/A;   ANTERIOR CERVICAL DECOMP/DISCECTOMY FUSION N/A 07/22/2021   Procedure: ANTERIOR CERVICAL DECOMPRESSION/DISCECTOMY  REVISON FUSION  WITH EVACUATION OF EPIDURAL HEMATOMA;  Surgeon: [name], DO;  Location: [facility];  Service: Neurosurgery;  Laterality: N/A;   BACK SURGERY     5 lumbas disc with cervical and lumbar fusions   BIOPSY N/A 03/06/2014   Procedure: ESOPHAGEAL BIOPSIES;  Surgeon: [name], MD;  [facility];  Service: Endoscopy;  Laterality: N/A;   COLONOSCOPY     COLONOSCOPY WITH PROPOFOL N/A 03/06/2014   Procedure: COLONOSCOPY WITH PROPOFOL;  Surgeon: [name], MD;  [facility];  Service: Endoscopy;  Laterality: N/A;  in cecum at 0807; total withdrawal time 9 minutes   CORONARY ANGIOPLASTY WITH STENT PLACEMENT     2000, and 2004 has 3 stents   CORONARY ARTERY BYPASS GRAFT N/A 04/27/2018   Procedure: CORONARY ARTERY BYPASS GRAFTING (CABG) x Three , using left internal mammary artery and right leg greater saphenous vein;  Surgeon: [name], MD;  Location: [facility];  Service: Open Heart Surgery;  Laterality: N/A;   ESOPHAGOGASTRODUODENOSCOPY (EGD) WITH PROPOFOL N/A 03/06/2014   Procedure: ESOPHAGOGASTRODUODENOSCOPY (EGD) WITH PROPOFOL;  Surgeon: [name], MD;  [facility];  Service: Endoscopy;  Laterality: N/A;   LEFT HEART CATH AND CORONARY ANGIOGRAPHY N/A 04/24/2018   Procedure: LEFT HEART CATH AND CORONARY ANGIOGRAPHY;  Surgeon: [name], MD;  Location: [facility];  Service: Cardiovascular;  Laterality: N/A;   LUMBAR FUSION  2009   MALONEY DILATION N/A 03/06/2014   Procedure: MALONEY DILATION 54 french;  Surgeon: [name], MD;  [facility];  Service: Endoscopy;  Laterality: N/A;   NECK SURGERY     TEE WITHOUT CARDIOVERSION N/A 04/27/2018   Procedure: TRANSESOPHAGEAL ECHOCARDIOGRAM (TEE);  Surgeon: [name], MD;  Location: [facility];  Service: Open Heart Surgery;  Laterality: N/A;    There were no vitals filed for this visit.   Subjective Assessment - 10/14/21 1432     Subjective  S: I'm stiff today.    Currently in Pain? Yes     Pain Score 2     Pain Location Shoulder    Pain Orientation Right    Pain Descriptors / Indicators Aching;Sore    Pain Type Acute pain    Pain Radiating Towards N/A    Pain Onset Yesterday    Pain Frequency Intermittent    Aggravating Factors  laying in bed, increased use, weather    Pain Relieving Factors rest    Effect of Pain on Daily Activities min effect on ADLs                [facility] OT Assessment - 10/14/21 1432       Assessment   Medical Diagnosis Acute incomplete quadriplegia      Precautions   Precautions Cervical;Fall    Required Braces or Orthoses Cervical Brace    Cervical Brace Soft collar;For comfort                      OT Treatments/Exercises (OP) - 10/14/21 1437       Exercises   Exercises Hand;Theraputty      Neurological Re-education Exercises   Shoulder Flexion AROM;10 reps;Supine    Shoulder ABduction AROM;10 reps;Supine    Shoulder Protraction AROM;10 reps;Supine    Shoulder Horizontal ABduction AROM;10 reps;Supine    Shoulder External Rotation AROM;10 reps;Seated    Shoulder Internal Rotation AROM;10 reps;Seated    Elbow Flexion Strengthening;10 reps   3#-bicep, hammer, pronated curls   Other Exercises 1 proximal shoulder strengthening in supine, 10X each, 1 rest break    Other Exercises 2 Pt completing functional reaching with pinch tree, pt placing 5 clothespins along vertical pole, and filling up top horizontal pole. Rest breaks provided as needed for fatigue.      Manual Therapy   Manual Therapy Myofascial release    Manual therapy comments completed separately from therapeutic exercises    Myofascial Release myofascial release to right anterior shoulder and upper trapezius regions to decrease pain and fascial restrictions and increase joint ROM                      OT Short Term Goals - 10/12/21 1521       OT SHORT TERM GOAL #1   Title Pt will be provided with and educated on HEP to improve mobility of  BUE required for ADL task completion.    Time 4    Period Weeks    Status On-going    Target Date 10/14/21      OT SHORT TERM GOAL #2   Title Pt will demonstrate RUE A/ROM WFL to improve ability to reach overhead and behind back during dressing tasks.    Time 4    Period Weeks    Status On-going  OT SHORT TERM GOAL #3   Title Pt will demonstrate improved fine motor coordination required for self-feeding by completing 9 hole peg test in under 1' 15"  for the left hand and in under 6' for the right hand.    Baseline 12/13: right hand 1'46", left hand 1' 25"     Time 4    Period Weeks    Status Partially Met      OT SHORT TERM GOAL #4   Title Pt will demonstrate improved grip strength of at least 35# required for holding items with right hand.    Time 4    Period Weeks    Status Achieved               OT Long Term Goals - 10/12/21 1522       OT LONG TERM GOAL #1   Title Pt will increase RUE strength to 4/5 or greater to improve ability to use RUE as dominant during simple ADLs.    Time 8    Period Weeks    Status On-going      OT LONG TERM GOAL #2   Title Pt will increase right grip strength to 40# or greater and pinch strength to 12# or greater to improve ability to complete functional tasks requiring sustained grip on objects.    Time 8    Period Weeks    Status Achieved      OT LONG TERM GOAL #3   Title Pt will increase fine motor coordination required for operating buttons or tying shoes by completing 9 hole peg test in under 1' with left hand and under 4' with right hand.    Baseline 12/13: right hand 1' 49" , left hand 1' 25"     Time 8    Period Weeks    Status Revised      OT LONG TERM GOAL #4   Title Pt will be educated on AE available to faciliate independence in ADLs.    Time 8    Period Weeks    Status On-going                   Plan - 10/14/21 1713     Clinical Impression Statement A: Pt reporting tightness in his shoulder today,  myofascial release completed at beginning of session to address fascial restrictions. Resumed supine A/ROM, occasional rest breaks provided for fatigue, notably with abduction. Added proximal shoulder strengthening and functional reaching tasks. Improvement in coordination with proximal shoulder strengthening today.    Body Structure / Function / Physical Skills ADL;Endurance;UE functional use;Pain;ROM;IADL;Strength;Mobility;Coordination    Plan P: Continue with shoulder work first then transition to fine motor task    OT Home Exercise Plan 11/15: red theraputty exercises; 11/17: supine A/ROM; 11/21: hand A/ROM; 12/13: seated AA/ROM    Consulted and Agree with Plan of Care Patient             Patient will benefit from skilled therapeutic intervention in order to improve the following deficits and impairments:   Body Structure / Function / Physical Skills: ADL, Endurance, UE functional use, Pain, ROM, IADL, Strength, Mobility, Coordination       Visit Diagnosis: Other lack of coordination  Other symptoms and signs involving the nervous system    Problem List Patient Active Problem List   Diagnosis Date Noted   Cervical myelopathy ([facility])    Hyponatremia    Neurogenic bladder    Pressure injury of skin 08/08/2021   Acute incomplete quadriplegia ([facility])  08/01/2021   Epidural hematoma 07/22/2021   Myelopathy ([facility]) 07/19/2021   Callus 05/18/2021   Acute bacterial rhinosinusitis 10/05/2020   Cough in adult 10/05/2020   S/P CABG x 3 04/27/2018   Angina pectoris ([facility] [place]) 04/24/2018   Obesity    Chronic combined systolic and diastolic congestive heart failure ([place])    Peripheral edema 06/30/2017   Pedal edema 11/28/2016   Osteopenia 08/13/2015   Thrombocytopenia ([place]) 05/25/2015   Chronic ITP (idiopathic thrombocytopenia) ([facility]) 05/25/2015   Hypothyroidism 08/29/2014   Diverticulitis of colon (without mention of hemorrhage)(562.11) 02/04/2014   Dysphagia, unspecified(787.20)  02/04/2014   Hyperlipidemia 11/18/2013   Dyspnea 01/02/2012   Palpitations 01/02/2012   Mixed hyperlipidemia 07/06/2011   Coronary artery disease involving native coronary artery of native heart with angina pectoris ([place])    TOTAL KNEE FOLLOW-UP 08/14/2008   Chronic back pain 07/23/2008   KNEE, ARTHRITIS, DEGEN./OSTEO 05/29/2008   JOINT EFFUSION, KNEE 04/30/2008   Pain in joint, lower leg 10/10/2007   Essential hypertension 10/09/2007    Guadelupe Sabin, OTR/L  [phone] 10/14/2021, 5:15 PM  [facility] [place], [place], [address] Phone: [phone]   Fax:  [phone]  Name: EOIN WILLDEN MRN: [id number] Date of Birth: 1951/05/17

## 2021-10-14 NOTE — Therapy (Signed)
[facility] [address] [place], [place], [address] Phone: [phone]   Fax:  [phone]  Physical Therapy Treatment  Patient Details  Name: [name] MRN: [id number] Date of Birth: August 17, 1951 Referring Provider (PT): [name], NP   Encounter Date: 10/14/2021   PT End of Session - 10/14/21 1541     Visit Number 4    Number of Visits 12    Authorization Type U[employer] Medicare    Progress Note Due on Visit 10    PT Start Time 1532    PT Stop Time 1612    PT Time Calculation (min) 40 min    Equipment Utilized During Treatment Gait belt    Activity Tolerance Patient tolerated treatment well    Behavior During Therapy WFL for tasks assessed/performed             Past Medical History:  Diagnosis Date   Allergy    Carpal tunnel syndrome    Chronic back pain    Chronic combined systolic and diastolic congestive heart failure (HCC)    Chronic ITP (idiopathic thrombocytopenia) ([place]) 05/25/2015   Coronary artery disease    Coronary artery disease involving native coronary artery of native heart with angina pectoris ([place])    Degenerative disc disease    GERD (gastroesophageal reflux disease)    History of thrombocytopenia    Hypercholesteremia    Hypertension    Hypothyroid    Lumbar pain    Myocardial infarction ([place]) 2009   Obesity    Pneumonia    around age 46   S/P CABG x 3 04/27/2018   LIMA to LAD SVG to OM1 SVG to OM2   Shortness of breath dyspnea     Past Surgical History:  Procedure Laterality Date   ANTERIOR CERVICAL DECOMP/DISCECTOMY FUSION N/A 07/19/2021   Procedure: CERVICAL THREE-FOUR ANTERIOR CERVICAL DECOMPRESSION/DISCECTOMY FUSION;  Surgeon: [name], MD;  Location: [facility];  Service: Neurosurgery;  Laterality: N/A;   ANTERIOR CERVICAL DECOMP/DISCECTOMY FUSION N/A 07/22/2021   Procedure: ANTERIOR CERVICAL DECOMPRESSION/DISCECTOMY REVISON FUSION  WITH EVACUATION OF EPIDURAL HEMATOMA;  Surgeon:  [name], DO;  Location: [facility];  Service: Neurosurgery;  Laterality: N/A;   BACK SURGERY     5 lumbas disc with cervical and lumbar fusions   BIOPSY N/A 03/06/2014   Procedure: ESOPHAGEAL BIOPSIES;  Surgeon: [name], MD;  [facility]: [facility];  Service: Endoscopy;  Laterality: N/A;   COLONOSCOPY     COLONOSCOPY WITH PROPOFOL N/A 03/06/2014   Procedure: COLONOSCOPY WITH PROPOFOL;  Surgeon: [name], MD;  Location: [facility];  Service: Endoscopy;  Laterality: N/A;  in cecum at 0807; total withdrawal time 9 minutes   CORONARY ANGIOPLASTY WITH STENT PLACEMENT     2000, and 2004 has 3 stents   CORONARY ARTERY BYPASS GRAFT N/A 04/27/2018   Procedure: CORONARY ARTERY BYPASS GRAFTING (CABG) x Three , using left internal mammary artery and right leg greater saphenous vein;  Surgeon: [name], MD;  Location: [facility];  Service: Open Heart Surgery;  Laterality: N/A;   ESOPHAGOGASTRODUODENOSCOPY (EGD) WITH PROPOFOL N/A 03/06/2014   Procedure: ESOPHAGOGASTRODUODENOSCOPY (EGD) WITH PROPOFOL;  Surgeon: [name], MD;  [facility];  Service: Endoscopy;  Laterality: N/A;   LEFT HEART CATH AND CORONARY ANGIOGRAPHY N/A 04/24/2018   Procedure: LEFT HEART CATH AND CORONARY ANGIOGRAPHY;  Surgeon: [name], MD;  Location: [facility];  Service: Cardiovascular;  Laterality: N/A;  LUMBAR FUSION  2009   MALONEY DILATION N/A 03/06/2014   Procedure: MALONEY DILATION 54 french;  Surgeon: [name], MD;  [facility];  Service: Endoscopy;  Laterality: N/A;   NECK SURGERY     TEE WITHOUT CARDIOVERSION N/A 04/27/2018   Procedure: TRANSESOPHAGEAL ECHOCARDIOGRAM (TEE);  Surgeon: [name], MD;  Location: [facility];  Service: Open Heart Surgery;  Laterality: N/A;    There were no vitals filed for this visit.   Subjective Assessment - 10/14/21 1537     Subjective Pt stated he would like to know strategies to help assist with floor to standing in case he slides out of wheel  chair, doesn't want to have to call 911 every time.  Reports he feels forward bent today.    Pertinent History 70 y/o male who underwent C3/4 ACDF on 07/19/21 then presented back to the ED on 07/20/21 with cervical stenosis with right greater than left weakness due to C3-4 epidural hematoma; cervical myelopathy. Pt underwent C3/4 revision and evacuation of hematoma, Placement of intervertebral biomechanical device C3-4, globus interbody, and Placement of anterior instrumentation consisting of interbody plate and screws    Patient Stated Goals Be able to walk instead of using w/c; 12/13: ability to get up from floor    Currently in Pain? Yes    Pain Score 6     Pain Location Back    Pain Orientation Lower    Pain Descriptors / Indicators Aching;Sore    Pain Type Acute pain    Pain Radiating Towards N/A    Pain Onset Yesterday    Pain Frequency Intermittent    Aggravating Factors  laying in bed, increased use, weather    Pain Relieving Factors rest    Effect of Pain on Daily Activities min effect on ADLS                               [facility] Adult PT Treatment/Exercise - 10/14/21 1543       Ambulation/Gait   Ambulation/Gait Assistance 4: Min guard    Ambulation Distance (Feet) 34 Feet    Assistive device Rolling walker   WC behind   Gait Pattern Trunk flexed;Wide base of support;Decreased dorsiflexion - right    Gait Comments Body-weight support treadmill with speed from 1.35mph with cues for assisting trunk extension and right foot clearance. Tolerating approx 2 min intervals with onset of fatigue and decreased RLE coordination. Tolerating 3 trials today               Knee/Hip Exercises: Stretches   Other Knee/Hip Stretches POE 2x 66min      Knee/Hip Exercises: Seated   Sit to Sand 2 sets;5 reps;without UE support   22in height     Knee/Hip Exercises: Supine   Bridges 10 reps         Chair push up 2x10    Edge of mat dip and raise 10x    Prone: heel squeeze 5x5  "                     PT Education - 10/14/21 1542     Education Details Demonstrated strageties to assist with floor to sitting on mat, included scooting and UE pushups    Person(s) Educated Patient    Methods Demonstration    Comprehension Verbalized understanding              PT Short  Term Goals - 10/05/21 1523       PT SHORT TERM GOAL #1   Title Patient will be able to complete 5x STS in under 15 seconds in order to reduce the risk of falls.    Baseline 20 sec with BUE support    Time 3    Period Weeks    Status On-going    Target Date 10/20/21      PT SHORT TERM GOAL #2   Title Decrease risk for falls per TUG test time 12 sec with least restrictive AD    Baseline 23 sec w/RW    Time 3    Period Weeks    Status On-going    Target Date 10/20/21      PT SHORT TERM GOAL #3   Title Patient will report at least 25% improvement in symptoms for improved quality of life.    Time 3    Period Weeks    Status New    Target Date 10/20/21               PT Long Term Goals - 09/29/21 1211       PT LONG TERM GOAL #1   Title Demo 4/5 BLE strength to improve safety and stability for standing ADL and gait    Baseline 3 to 4-/5 gross strength    Time 6    Period Weeks    Status New    Target Date 11/10/21      PT LONG TERM GOAL #2   Title Demo modified independent ambulation x 350 ft to improve functional ambulation    Baseline 87 ft CGA with RW    Time 6    Period Weeks    Status New    Target Date 11/10/21      PT LONG TERM GOAL #3   Title Ascend/descend curb with least restrictive AD to improve safety with ambulation    Baseline DNT    Time 6    Period Weeks    Status New    Target Date 11/10/21                   Plan - 10/14/21 1619     Clinical Impression Statement Therapist demonstrated strategies to assist with floor to sitting, pt did not attempt this session, did verbalize understanding to assist.  Pt educated on importance  of posture for pain and balance.  Added POE and prone gluteal strengthening exercises that was tolerated well.  Added chair pushups to HEP for UE strengthening to assist with transfer training.  Gait trainer used on treadmill to improve foot cleance and normalize mechanics, Lt LE fatigued with increased cueing to reduce circumduction and improve mechanics.  Reports lower back felt better following prone exercises.    Personal Factors and Comorbidities Age;Comorbidity 1;Time since onset of injury/illness/exacerbation    Comorbidities hx of multiple spine surgeries    Examination-Activity Limitations Bathing;Bend;Carry;Lift;Toileting;Stand;Stairs;Squat;Reach Overhead;Locomotion Level;Transfers    Examination-Participation Restrictions Cleaning;Yard Work;Meal Prep;Driving;Community Activity    Stability/Clinical Decision Making Evolving/Moderate complexity    Clinical Decision Making Moderate    Rehab Potential Good    PT Frequency 2x / week    PT Duration 6 weeks    PT Treatment/Interventions ADLs/Self Care Home Management;Electrical Stimulation;DME Instruction;Gait training;Stair training;Functional mobility training;Therapeutic activities;Therapeutic exercise;Balance training;Patient/family education;Neuromuscular re-education;Wheelchair mobility training;Manual techniques;Dry needling    PT Next Visit Plan Body-weight support treadmill, progress gait, balance and functional hip strengthening.    PT Home Exercise Plan SLR;  12/13: bridge and clam; 12/15: chair push up    Consulted and Agree with Plan of Care Patient             Patient will benefit from skilled therapeutic intervention in order to improve the following deficits and impairments:  Abnormal gait, Decreased activity tolerance, Decreased balance, Decreased mobility, Decreased knowledge of use of DME, Decreased endurance, Decreased coordination, Decreased strength, Difficulty walking, Postural dysfunction, Improper body mechanics,  Pain  Visit Diagnosis: Difficulty in walking, not elsewhere classified  Muscle weakness (generalized)  Other abnormalities of gait and mobility     Problem List Patient Active Problem List   Diagnosis Date Noted   Cervical myelopathy ([place])    Hyponatremia    Neurogenic bladder    Pressure injury of skin 08/08/2021   Acute incomplete quadriplegia ([place]) 08/01/2021   Epidural hematoma 07/22/2021   Myelopathy ([facility]) 07/19/2021   Callus 05/18/2021   Acute bacterial rhinosinusitis 10/05/2020   Cough in adult 10/05/2020   S/P CABG x 3 04/27/2018   Angina pectoris ([place]) 04/24/2018   Obesity    Chronic combined systolic and diastolic congestive heart failure ([place])    Peripheral edema 06/30/2017   Pedal edema 11/28/2016   Osteopenia 08/13/2015   Thrombocytopenia ([place]) 05/25/2015   Chronic ITP (idiopathic thrombocytopenia) ([place]) 05/25/2015   Hypothyroidism 08/29/2014   Diverticulitis of colon (without mention of hemorrhage)(562.11) 02/04/2014   Dysphagia, unspecified(787.20) 02/04/2014   Hyperlipidemia 11/18/2013   Dyspnea 01/02/2012   Palpitations 01/02/2012   Mixed hyperlipidemia 07/06/2011   Coronary artery disease involving native coronary artery of native heart with angina pectoris ([place])    TOTAL KNEE FOLLOW-UP 08/14/2008   Chronic back pain 07/23/2008   KNEE, ARTHRITIS, DEGEN./OSTEO 05/29/2008   JOINT EFFUSION, KNEE 04/30/2008   Pain in joint, lower leg 10/10/2007   Essential hypertension 10/09/2007   Ihor Austin, LPTA/CLT; CBIS [phone]  Aldona Lento, PTA 10/14/2021, 5:20 PM  [facility] [address] [place], [place], [address] Phone: [phone]   Fax:  [phone]  Name: [name] MRN: [id number] Date of Birth: 1951/06/30

## 2021-10-19 ENCOUNTER — Ambulatory Visit (HOSPITAL_COMMUNITY): Payer: Medicare Other | Admitting: Physical Therapy

## 2021-10-19 ENCOUNTER — Encounter (HOSPITAL_COMMUNITY): Payer: Self-pay | Admitting: Occupational Therapy

## 2021-10-19 ENCOUNTER — Other Ambulatory Visit: Payer: Self-pay

## 2021-10-19 ENCOUNTER — Ambulatory Visit (HOSPITAL_COMMUNITY): Payer: Medicare Other | Admitting: Occupational Therapy

## 2021-10-19 DIAGNOSIS — R2681 Unsteadiness on feet: Secondary | ICD-10-CM | POA: Diagnosis not present

## 2021-10-19 DIAGNOSIS — M6281 Muscle weakness (generalized): Secondary | ICD-10-CM

## 2021-10-19 DIAGNOSIS — R29818 Other symptoms and signs involving the nervous system: Secondary | ICD-10-CM

## 2021-10-19 DIAGNOSIS — R2689 Other abnormalities of gait and mobility: Secondary | ICD-10-CM

## 2021-10-19 DIAGNOSIS — R262 Difficulty in walking, not elsewhere classified: Secondary | ICD-10-CM

## 2021-10-19 DIAGNOSIS — R278 Other lack of coordination: Secondary | ICD-10-CM

## 2021-10-19 NOTE — Therapy (Signed)
[facility] [address] [place], [place], [address] Phone: [phone]   Fax:  [phone]  Physical Therapy Treatment  Patient Details  Name: [name] MRN: [id number] Date of Birth: 04/22/1951 Referring Provider (PT): [name], NP   Encounter Date: 10/19/2021   PT End of Session - 10/19/21 1317     Visit Number 5    Number of Visits 12    Authorization Type UHC Medicare    Progress Note Due on Visit 10    PT Start Time 1320    PT Stop Time 1400    PT Time Calculation (min) 40 min    Equipment Utilized During Treatment Gait belt    Activity Tolerance Patient tolerated treatment well    Behavior During Therapy WFL for tasks assessed/performed             Past Medical History:  Diagnosis Date   Allergy    Carpal tunnel syndrome    Chronic back pain    Chronic combined systolic and diastolic congestive heart failure (HCC)    Chronic ITP (idiopathic thrombocytopenia) ([facility]) 05/25/2015   Coronary artery disease    Coronary artery disease involving native coronary artery of native heart with angina pectoris ([facility])    Degenerative disc disease    GERD (gastroesophageal reflux disease)    History of thrombocytopenia    Hypercholesteremia    Hypertension    Hypothyroid    Lumbar pain    Myocardial infarction ([facility]) 2009   Obesity    Pneumonia    around age 2   S/P CABG x 3 04/27/2018   LIMA to LAD SVG to OM1 SVG to OM2   Shortness of breath dyspnea     Past Surgical History:  Procedure Laterality Date   ANTERIOR CERVICAL DECOMP/DISCECTOMY FUSION N/A 07/19/2021   Procedure: CERVICAL THREE-FOUR ANTERIOR CERVICAL DECOMPRESSION/DISCECTOMY FUSION;  Surgeon: [name], MD;  Location: [facility];  Service: Neurosurgery;  Laterality: N/A;   ANTERIOR CERVICAL DECOMP/DISCECTOMY FUSION N/A 07/22/2021   Procedure: ANTERIOR CERVICAL DECOMPRESSION/DISCECTOMY REVISON FUSION  WITH EVACUATION OF EPIDURAL HEMATOMA;  Surgeon:  [name], DO;  Location: [facility];  Service: Neurosurgery;  Laterality: N/A;   BACK SURGERY     5 lumbas disc with cervical and lumbar fusions   BIOPSY N/A 03/06/2014   Procedure: ESOPHAGEAL BIOPSIES;  Surgeon: [name], MD;  [facility];  Service: Endoscopy;  Laterality: N/A;   COLONOSCOPY     COLONOSCOPY WITH PROPOFOL N/A 03/06/2014   Procedure: COLONOSCOPY WITH PROPOFOL;  Surgeon: [name], MD;  Location: [facility];  Service: Endoscopy;  Laterality: N/A;  in cecum at 0807; total withdrawal time 9 minutes   CORONARY ANGIOPLASTY WITH STENT PLACEMENT     2000, and 2004 has 3 stents   CORONARY ARTERY BYPASS GRAFT N/A 04/27/2018   Procedure: CORONARY ARTERY BYPASS GRAFTING (CABG) x Three , using left internal mammary artery and right leg greater saphenous vein;  Surgeon: [name], MD;  Location: [facility];  Service: Open Heart Surgery;  Laterality: N/A;   ESOPHAGOGASTRODUODENOSCOPY (EGD) WITH PROPOFOL N/A 03/06/2014   Procedure: ESOPHAGOGASTRODUODENOSCOPY (EGD) WITH PROPOFOL;  Surgeon: [name], MD;  [facility];  Service: Endoscopy;  Laterality: N/A;   LEFT HEART CATH AND CORONARY ANGIOGRAPHY N/A 04/24/2018   Procedure: LEFT HEART CATH AND CORONARY ANGIOGRAPHY;  Surgeon: [name], MD;  Location: [facility];  Service: Cardiovascular;  Laterality: N/A;  LUMBAR FUSION  2009   MALONEY DILATION N/A 03/06/2014   Procedure: MALONEY DILATION 54 french;  Surgeon: [name], MD;  [facility];  Service: Endoscopy;  Laterality: N/A;   NECK SURGERY     TEE WITHOUT CARDIOVERSION N/A 04/27/2018   Procedure: TRANSESOPHAGEAL ECHOCARDIOGRAM (TEE);  Surgeon: [name], MD;  Location: [facility];  Service: Open Heart Surgery;  Laterality: N/A;    There were no vitals filed for this visit.   Subjective Assessment - 10/19/21 1317     Subjective Pt states that he is very sore the day after therapy; he is not completing his exercises every day.     Pertinent History 70 y/o male who underwent C3/4 ACDF on 07/19/21 then presented back to the ED on 07/20/21 with cervical stenosis with right greater than left weakness due to C3-4 epidural hematoma; cervical myelopathy. Pt underwent C3/4 revision and evacuation of hematoma, Placement of intervertebral biomechanical device C3-4, globus interbody, and Placement of anterior instrumentation consisting of interbody plate and screws    Patient Stated Goals Be able to walk instead of using w/c; 12/13: ability to get up from floor    Currently in Pain? Yes    Pain Score 5     Pain Location Back    Pain Orientation Lower    Pain Descriptors / Indicators Aching    Pain Type Chronic pain    Pain Onset Yesterday    Aggravating Factors  sitting in his W/C    Pain Relieving Factors rest                               [facility] Adult PT Treatment/Exercise - 10/19/21 0001       Exercises   Exercises Knee/Hip      Knee/Hip Exercises: Stretches   Other Knee/Hip Stretches POE with glut sets x 5" x 10      Knee/Hip Exercises: Standing   Heel Raises Both;10 reps    Terminal Knee Extension Both;10 reps    Functional Squat 10 reps    Other Standing Knee Exercises side stepping x 2    Other Standing Knee Exercises marching x 10      Knee/Hip Exercises: Seated   Sit to Sand 10 reps      Knee/Hip Exercises: Supine   Bridges with [employer] Strengthening;15 reps    Other Supine Knee/Hip Exercises hip ab/adduction x10    Other Supine Knee/Hip Exercises bent knee raise x 10 B ; LAQx 10     Knee/Hip Exercises: Prone   Hamstring Curl 10 reps                       PT Short Term Goals - 10/05/21 1523       PT SHORT TERM GOAL #1   Title Patient will be able to complete 5x STS in under 15 seconds in order to reduce the risk of falls.    Baseline 20 sec with BUE support    Time 3    Period Weeks    Status On-going    Target Date 10/20/21      PT SHORT TERM GOAL #2    Title Decrease risk for falls per TUG test time 12 sec with least restrictive AD    Baseline 23 sec w/RW    Time 3    Period Weeks    Status On-going    Target Date  10/20/21      PT SHORT TERM GOAL #3   Title Patient will report at least 25% improvement in symptoms for improved quality of life.    Time 3    Period Weeks    Status New    Target Date 10/20/21               PT Long Term Goals - 10/19/21 1351       PT LONG TERM GOAL #1   Title Demo 4/5 BLE strength to improve safety and stability for standing ADL and gait    Baseline 3 to 4-/5 gross strength    Time 6    Period Weeks    Status On-going    Target Date 11/10/21      PT LONG TERM GOAL #2   Title Demo modified independent ambulation x 350 ft to improve functional ambulation    Baseline 87 ft CGA with RW    Time 6    Period Weeks    Status On-going    Target Date 11/10/21      PT LONG TERM GOAL #3   Title Ascend/descend curb with least restrictive AD to improve safety with ambulation    Baseline DNT    Time 6    Period Weeks    Status On-going    Target Date 11/10/21                   Plan - 10/19/21 1318     Clinical Impression Statement Concentrated on completing exercises with equal weight bearing.  Added standing exercises to improve standing tolerance.  Attempted sidelying Abduction but this was to difficult for pt at this time. .    Personal Factors and Comorbidities Age;Comorbidity 1;Time since onset of injury/illness/exacerbation    Comorbidities hx of multiple spine surgeries    Examination-Activity Limitations Bathing;Bend;Carry;Lift;Toileting;Stand;Stairs;Squat;Reach Overhead;Locomotion Level;Transfers    Examination-Participation Restrictions Cleaning;Yard Work;Meal Prep;Driving;Community Activity    Stability/Clinical Decision Making Evolving/Moderate complexity    Rehab Potential Good    PT Frequency 2x / week    PT Duration 6 weeks    PT Treatment/Interventions ADLs/Self  Care Home Management;Electrical Stimulation;DME Instruction;Gait training;Stair training;Functional mobility training;Therapeutic activities;Therapeutic exercise;Balance training;Patient/family education;Neuromuscular re-education;Wheelchair mobility training;Manual techniques;Dry needling    PT Next Visit Plan Body-weight support treadmill, progress gait, balance and functional hip strengthening.    PT Home Exercise Plan SLR; 12/13: bridge and clam; 12/15: chair push up; 12/20 sit to stand; supine hip abduction    Consulted and Agree with Plan of Care Patient             Patient will benefit from skilled therapeutic intervention in order to improve the following deficits and impairments:  Abnormal gait, Decreased activity tolerance, Decreased balance, Decreased mobility, Decreased knowledge of use of DME, Decreased endurance, Decreased coordination, Decreased strength, Difficulty walking, Postural dysfunction, Improper body mechanics, Pain  Visit Diagnosis: Difficulty in walking, not elsewhere classified  Muscle weakness (generalized)  Other abnormalities of gait and mobility     Problem List Patient Active Problem List   Diagnosis Date Noted   Cervical myelopathy ([place])    Hyponatremia    Neurogenic bladder    Pressure injury of skin 08/08/2021   Acute incomplete quadriplegia ([place]) 08/01/2021   Epidural hematoma 07/22/2021   Myelopathy ([place]) 07/19/2021   Callus 05/18/2021   Acute bacterial rhinosinusitis 10/05/2020   Cough in adult 10/05/2020   S/P CABG x 3 04/27/2018   Angina pectoris ([place]) 04/24/2018   Obesity  Chronic combined systolic and diastolic congestive heart failure (HCC)    Peripheral edema 06/30/2017   Pedal edema 11/28/2016   Osteopenia 08/13/2015   Thrombocytopenia ([facility]) 05/25/2015   Chronic ITP (idiopathic thrombocytopenia) ([place]) 05/25/2015   Hypothyroidism 08/29/2014   Diverticulitis of colon (without mention of hemorrhage)(562.11) 02/04/2014    Dysphagia, unspecified(787.20) 02/04/2014   Hyperlipidemia 11/18/2013   Dyspnea 01/02/2012   Palpitations 01/02/2012   Mixed hyperlipidemia 07/06/2011   Coronary artery disease involving native coronary artery of native heart with angina pectoris ([place])    TOTAL KNEE FOLLOW-UP 08/14/2008   Chronic back pain 07/23/2008   KNEE, ARTHRITIS, DEGEN./OSTEO 05/29/2008   JOINT EFFUSION, KNEE 04/30/2008   Pain in joint, lower leg 10/10/2007   Essential hypertension 10/09/2007  Rayetta Humphrey, PT CLT [phone]  10/19/2021, 2:02 PM  [facility] [place], [place], [address] Phone: [phone]   Fax:  [phone]  Name: [name] MRN: [id number] Date of Birth: 11-18-1950

## 2021-10-19 NOTE — Therapy (Signed)
[facility] [address] [place], [place], [address] Phone: [phone]   Fax:  [phone]  Occupational Therapy Treatment  Patient Details  Name: [name] MRN: [id number] Date of Birth: Aug 26, 1951 Referring Provider (OT): Danella Sensing, NP   Encounter Date: 10/19/2021   OT End of Session - 10/19/21 1541     Visit Number 9    Number of Visits 16    Date for OT Re-Evaluation 11/13/21    Authorization Type UHC Medicare; $30 copay per discipline per visit    Authorization Time Period no visit limit    Progress Note Due on Visit 17    OT Start Time 1500    OT Stop Time 1543    OT Time Calculation (min) 43 min    Activity Tolerance Patient tolerated treatment well    Behavior During Therapy [facility] for tasks assessed/performed             Past Medical History:  Diagnosis Date   Allergy    Carpal tunnel syndrome    Chronic back pain    Chronic combined systolic and diastolic congestive heart failure (HCC)    Chronic ITP (idiopathic thrombocytopenia) ([place]) 05/25/2015   Coronary artery disease    Coronary artery disease involving native coronary artery of native heart with angina pectoris (HCC)    Degenerative disc disease    GERD (gastroesophageal reflux disease)    History of thrombocytopenia    Hypercholesteremia    Hypertension    Hypothyroid    Lumbar pain    Myocardial infarction ([place]) 2009   Obesity    Pneumonia    around age 89   S/P CABG x 3 04/27/2018   LIMA to LAD SVG to OM1 SVG to OM2   Shortness of breath dyspnea     Past Surgical History:  Procedure Laterality Date   ANTERIOR CERVICAL DECOMP/DISCECTOMY FUSION N/A 07/19/2021   Procedure: CERVICAL THREE-FOUR ANTERIOR CERVICAL DECOMPRESSION/DISCECTOMY FUSION;  Surgeon: [name], MD;  Location: [place];  Service: Neurosurgery;  Laterality: N/A;   ANTERIOR CERVICAL DECOMP/DISCECTOMY FUSION N/A 07/22/2021   Procedure: ANTERIOR CERVICAL DECOMPRESSION/DISCECTOMY  REVISON FUSION  WITH EVACUATION OF EPIDURAL HEMATOMA;  Surgeon: [name], DO;  Location: [place];  Service: Neurosurgery;  Laterality: N/A;   BACK SURGERY     5 lumbas disc with cervical and lumbar fusions   BIOPSY N/A 03/06/2014   Procedure: ESOPHAGEAL BIOPSIES;  Surgeon: [name], MD;  [facility];  Service: Endoscopy;  Laterality: N/A;   COLONOSCOPY     COLONOSCOPY WITH PROPOFOL N/A 03/06/2014   Procedure: COLONOSCOPY WITH PROPOFOL;  Surgeon: [name], MD;  [facility];  Service: Endoscopy;  Laterality: N/A;  in cecum at 0807; total withdrawal time 9 minutes   CORONARY ANGIOPLASTY WITH STENT PLACEMENT     2000, and 2004 has 3 stents   CORONARY ARTERY BYPASS GRAFT N/A 04/27/2018   Procedure: CORONARY ARTERY BYPASS GRAFTING (CABG) x Three , using left internal mammary artery and right leg greater saphenous vein;  Surgeon: [name], MD;  Location: [place];  Service: Open Heart Surgery;  Laterality: N/A;   ESOPHAGOGASTRODUODENOSCOPY (EGD) WITH PROPOFOL N/A 03/06/2014   Procedure: ESOPHAGOGASTRODUODENOSCOPY (EGD) WITH PROPOFOL;  Surgeon: [name], MD;  [facility];  Service: Endoscopy;  Laterality: N/A;   LEFT HEART CATH AND CORONARY ANGIOGRAPHY N/A 04/24/2018   Procedure: LEFT HEART CATH AND CORONARY ANGIOGRAPHY;  Surgeon: [name], MD;  Location: [place] [facility];  Service: Cardiovascular;  Laterality: N/A;   LUMBAR FUSION  2009   MALONEY DILATION N/A 03/06/2014   Procedure: MALONEY DILATION 54 french;  Surgeon: [name], MD;  [facility];  Service: Endoscopy;  Laterality: N/A;   NECK SURGERY     TEE WITHOUT CARDIOVERSION N/A 04/27/2018   Procedure: TRANSESOPHAGEAL ECHOCARDIOGRAM (TEE);  Surgeon: [name], MD;  Location: [place];  Service: Open Heart Surgery;  Laterality: N/A;    There were no vitals filed for this visit.   Subjective Assessment - 10/19/21 1459     Subjective  S: I feel tight but I'm not hurting.     Currently in Pain? Yes    Pain Score 5     Pain Location Back    Pain Orientation Lower    Pain Descriptors / Indicators Aching    Pain Type Chronic pain    Pain Radiating Towards N/A    Pain Onset Yesterday    Pain Frequency Intermittent    Aggravating Factors  sitting in W/C    Pain Relieving Factors rest    Effect of Pain on Daily Activities min effect on ADLs    Multiple Pain Sites No                [facility] OT Assessment - 10/19/21 1458       Assessment   Medical Diagnosis Acute incomplete quadriplegia      Precautions   Precautions Cervical;Fall    Required Braces or Orthoses Cervical Brace    Cervical Brace Soft collar;For comfort                      OT Treatments/Exercises (OP) - 10/19/21 1502       Exercises   Exercises Hand;Theraputty      Hand Exercises   Hand Gripper with Large Beads all beads gripper at 42#, vertical    Hand Gripper with Medium Beads all beads gripper at #, vertical      Neurological Re-education Exercises   Shoulder Flexion AROM;10 reps;Supine    Shoulder ABduction AROM;10 reps;Supine    Shoulder Protraction AROM;10 reps;Supine    Shoulder Horizontal ABduction AROM;10 reps;Supine    Shoulder External Rotation AROM;10 reps;Seated    Shoulder Internal Rotation AROM;10 reps;Seated    Other Exercises 1 proximal shoulder strengthening in supine, 10X each, 1 rest break      Manual Therapy   Manual Therapy Myofascial release    Manual therapy comments completed separately from therapeutic exercises    Myofascial Release myofascial release to right anterior shoulder and upper trapezius regions to decrease pain and fascial restrictions and increase joint ROM      Fine Motor Coordination (Hand/Wrist)   Fine Motor Coordination Flipping cards    Flipping cards Pt holding cards in left hand, using 3 point pinch with right hand to grasp and flip the cards over to place on tabletop. Increased time required to complete task.                       OT Short Term Goals - 10/12/21 1521       OT SHORT TERM GOAL #1   Title Pt will be provided with and educated on HEP to improve mobility of BUE required for ADL task completion.    Time 4    Period Weeks    Status On-going    Target Date 10/14/21  OT SHORT TERM GOAL #2   Title Pt will demonstrate RUE A/ROM WFL to improve ability to reach overhead and behind back during dressing tasks.    Time 4    Period Weeks    Status On-going      OT SHORT TERM GOAL #3   Title Pt will demonstrate improved fine motor coordination required for self-feeding by completing 9 hole peg test in under 1' 15"  for the left hand and in under 6' for the right hand.    Baseline 12/13: right hand 1'46", left hand 1' 25"     Time 4    Period Weeks    Status Partially Met      OT SHORT TERM GOAL #4   Title Pt will demonstrate improved grip strength of at least 35# required for holding items with right hand.    Time 4    Period Weeks    Status Achieved               OT Long Term Goals - 10/12/21 1522       OT LONG TERM GOAL #1   Title Pt will increase RUE strength to 4/5 or greater to improve ability to use RUE as dominant during simple ADLs.    Time 8    Period Weeks    Status On-going      OT LONG TERM GOAL #2   Title Pt will increase right grip strength to 40# or greater and pinch strength to 12# or greater to improve ability to complete functional tasks requiring sustained grip on objects.    Time 8    Period Weeks    Status Achieved      OT LONG TERM GOAL #3   Title Pt will increase fine motor coordination required for operating buttons or tying shoes by completing 9 hole peg test in under 1' with left hand and under 4' with right hand.    Baseline 12/13: right hand 1' 49" , left hand 1' 25"     Time 8    Period Weeks    Status Revised      OT LONG TERM GOAL #4   Title Pt will be educated on AE available to faciliate independence in ADLs.    Time 8     Period Weeks    Status On-going                   Plan - 10/19/21 1526     Clinical Impression Statement A: Pt reports he is able to hold a fork better and can unwrap some things now. Continued with myofascial release to right shoulder, P/ROM, and A/ROM in supine. Pt with improved tolerance and ability to complete exercises with less rest breaks. Continued with proximal shoulder strengthening as well. Pt completing grip strengthening, increased hand gripper resistance to 42# today. Added card flipping for fine motor work. Verbal cuing for form and technique, occasional rest breaks for fatigue.    Body Structure / Function / Physical Skills ADL;Endurance;UE functional use;Pain;ROM;IADL;Strength;Mobility;Coordination    Plan P: Continue with A/ROM and proximal shoulder strengthening in supine, fine motor task    OT Home Exercise Plan 11/15: red theraputty exercises; 11/17: supine A/ROM; 11/21: hand A/ROM; 12/13: seated AA/ROM    Consulted and Agree with Plan of Care Patient             Patient will benefit from skilled therapeutic intervention in order to improve the following deficits and impairments:   Body Structure /  Function / Physical Skills: ADL, Endurance, UE functional use, Pain, ROM, IADL, Strength, Mobility, Coordination       Visit Diagnosis: Other lack of coordination  Other symptoms and signs involving the nervous system    Problem List Patient Active Problem List   Diagnosis Date Noted   Cervical myelopathy ([place])    Hyponatremia    Neurogenic bladder    Pressure injury of skin 08/08/2021   Acute incomplete quadriplegia ([place]) 08/01/2021   Epidural hematoma 07/22/2021   Myelopathy ([place]) 07/19/2021   Callus 05/18/2021   Acute bacterial rhinosinusitis 10/05/2020   Cough in adult 10/05/2020   S/P CABG x 3 04/27/2018   Angina pectoris ([place]) 04/24/2018   Obesity    Chronic combined systolic and diastolic congestive heart failure ([place])    Peripheral  edema 06/30/2017   Pedal edema 11/28/2016   Osteopenia 08/13/2015   Thrombocytopenia ([place]) 05/25/2015   Chronic ITP (idiopathic thrombocytopenia) ([place]) 05/25/2015   Hypothyroidism 08/29/2014   Diverticulitis of colon (without mention of hemorrhage)(562.11) 02/04/2014   Dysphagia, unspecified(787.20) 02/04/2014   Hyperlipidemia 11/18/2013   Dyspnea 01/02/2012   Palpitations 01/02/2012   Mixed hyperlipidemia 07/06/2011   Coronary artery disease involving native coronary artery of native heart with angina pectoris ([place])    TOTAL KNEE FOLLOW-UP 08/14/2008   Chronic back pain 07/23/2008   KNEE, ARTHRITIS, DEGEN./OSTEO 05/29/2008   JOINT EFFUSION, KNEE 04/30/2008   Pain in joint, lower leg 10/10/2007   Essential hypertension 10/09/2007    Guadelupe Sabin, OTR/L  [phone] 10/19/2021, 3:43 PM  [place], [place], [address] Phone: [phone]   Fax:  [phone]  Name: [name] MRN: [id number] Date of Birth: Sep 06, 1951

## 2021-10-21 ENCOUNTER — Encounter (HOSPITAL_COMMUNITY): Payer: Self-pay | Admitting: Physical Therapy

## 2021-10-21 ENCOUNTER — Other Ambulatory Visit: Payer: Self-pay

## 2021-10-21 ENCOUNTER — Ambulatory Visit (HOSPITAL_COMMUNITY): Payer: Medicare Other | Admitting: Physical Therapy

## 2021-10-21 DIAGNOSIS — M6281 Muscle weakness (generalized): Secondary | ICD-10-CM

## 2021-10-21 DIAGNOSIS — R262 Difficulty in walking, not elsewhere classified: Secondary | ICD-10-CM

## 2021-10-21 DIAGNOSIS — R2689 Other abnormalities of gait and mobility: Secondary | ICD-10-CM

## 2021-10-21 DIAGNOSIS — R278 Other lack of coordination: Secondary | ICD-10-CM | POA: Diagnosis not present

## 2021-10-21 DIAGNOSIS — R29818 Other symptoms and signs involving the nervous system: Secondary | ICD-10-CM | POA: Diagnosis not present

## 2021-10-21 DIAGNOSIS — R2681 Unsteadiness on feet: Secondary | ICD-10-CM | POA: Diagnosis not present

## 2021-10-21 NOTE — Patient Instructions (Signed)
Access Code: [email or web address] URL: [email or web address] Date: 10/21/2021 Prepared by: Josue Hector  Exercises Hooklying Clamshell with Resistance - 2-3 x daily - 7 x weekly - 2 sets - 10 reps - 5 second hold Seated Long Arc Quad - 2-3 x daily - 7 x weekly - 2 sets - 10 reps - 3 second hold

## 2021-10-21 NOTE — Therapy (Signed)
[facility] [address] [place], [place], [address] Phone: [phone]   Fax:  [phone]  Physical Therapy Treatment  Patient Details  Name: [name] MRN: [id number] Date of Birth: 07-Dec-1950 Referring Provider (PT): [name], NP   Encounter Date: 10/21/2021   PT End of Session - 10/21/21 1126     Visit Number 6    Number of Visits 12    Authorization Type UHC Medicare    Progress Note Due on Visit 10    PT Start Time 1120    PT Stop Time 3570    PT Time Calculation (min) 38 min    Equipment Utilized During Treatment Gait belt    Activity Tolerance Patient tolerated treatment well    Behavior During Therapy WFL for tasks assessed/performed             Past Medical History:  Diagnosis Date   Allergy    Carpal tunnel syndrome    Chronic back pain    Chronic combined systolic and diastolic congestive heart failure (HCC)    Chronic ITP (idiopathic thrombocytopenia) ([place]) 05/25/2015   Coronary artery disease    Coronary artery disease involving native coronary artery of native heart with angina pectoris ([place])    Degenerative disc disease    GERD (gastroesophageal reflux disease)    History of thrombocytopenia    Hypercholesteremia    Hypertension    Hypothyroid    Lumbar pain    Myocardial infarction ([place]) 2009   Obesity    Pneumonia    around age 71   S/P CABG x 3 04/27/2018   LIMA to LAD SVG to OM1 SVG to OM2   Shortness of breath dyspnea     Past Surgical History:  Procedure Laterality Date   ANTERIOR CERVICAL DECOMP/DISCECTOMY FUSION N/A 07/19/2021   Procedure: CERVICAL THREE-FOUR ANTERIOR CERVICAL DECOMPRESSION/DISCECTOMY FUSION;  Surgeon: [name], MD;  Location: [facility];  Service: Neurosurgery;  Laterality: N/A;   ANTERIOR CERVICAL DECOMP/DISCECTOMY FUSION N/A 07/22/2021   Procedure: ANTERIOR CERVICAL DECOMPRESSION/DISCECTOMY REVISON FUSION  WITH EVACUATION OF EPIDURAL HEMATOMA;  Surgeon:  [name], DO;  Location: [place];  Service: Neurosurgery;  Laterality: N/A;   BACK SURGERY     5 lumbas disc with cervical and lumbar fusions   BIOPSY N/A 03/06/2014   Procedure: ESOPHAGEAL BIOPSIES;  Surgeon: [name], MD;  [facility];  Service: Endoscopy;  Laterality: N/A;   COLONOSCOPY     COLONOSCOPY WITH PROPOFOL N/A 03/06/2014   Procedure: COLONOSCOPY WITH PROPOFOL;  Surgeon: [name], MD;  [facility];  Service: Endoscopy;  Laterality: N/A;  in cecum at 0807; total withdrawal time 9 minutes   CORONARY ANGIOPLASTY WITH STENT PLACEMENT     2000, and 2004 has 3 stents   CORONARY ARTERY BYPASS GRAFT N/A 04/27/2018   Procedure: CORONARY ARTERY BYPASS GRAFTING (CABG) x Three , using left internal mammary artery and right leg greater saphenous vein;  Surgeon: [name], MD;  Location: [place];  Service: Open Heart Surgery;  Laterality: N/A;   ESOPHAGOGASTRODUODENOSCOPY (EGD) WITH PROPOFOL N/A 03/06/2014   Procedure: ESOPHAGOGASTRODUODENOSCOPY (EGD) WITH PROPOFOL;  Surgeon: [name], MD;  [facility];  Service: Endoscopy;  Laterality: N/A;   LEFT HEART CATH AND CORONARY ANGIOGRAPHY N/A 04/24/2018   Procedure: LEFT HEART CATH AND CORONARY ANGIOGRAPHY;  Surgeon: [name], MD;  Location: [place] [facility];  Service: Cardiovascular;  Laterality: N/A;  LUMBAR FUSION  2009   MALONEY DILATION N/A 03/06/2014   Procedure: MALONEY DILATION 54 french;  Surgeon: [name], MD;  [facility];  Service: Endoscopy;  Laterality: N/A;   NECK SURGERY     TEE WITHOUT CARDIOVERSION N/A 04/27/2018   Procedure: TRANSESOPHAGEAL ECHOCARDIOGRAM (TEE);  Surgeon: [name], MD;  Location: [place];  Service: Open Heart Surgery;  Laterality: N/A;    There were no vitals filed for this visit.   Subjective Assessment - 10/21/21 1124     Subjective Doing well with exercise. Sore the days following therapy. No new issues.    Pertinent History 70 y/o male who  underwent C3/4 ACDF on 07/19/21 then presented back to the ED on 07/20/21 with cervical stenosis with right greater than left weakness due to C3-4 epidural hematoma; cervical myelopathy. Pt underwent C3/4 revision and evacuation of hematoma, Placement of intervertebral biomechanical device C3-4, globus interbody, and Placement of anterior instrumentation consisting of interbody plate and screws    Patient Stated Goals Be able to walk instead of using w/c; 12/13: ability to get up from floor    Currently in Pain? Yes    Pain Score 5     Pain Location Back    Pain Orientation Lower    Pain Descriptors / Indicators Aching    Pain Type Chronic pain    Pain Onset Yesterday                               [facility] Adult PT Treatment/Exercise - 10/21/21 0001       Knee/Hip Exercises: Stretches   Other Knee/Hip Stretches POE with glute sets x 5" x 15      Knee/Hip Exercises: Aerobic   Nustep 5 minutes lv 2 for LE strengthening      Knee/Hip Exercises: Standing   Heel Raises 2 sets;10 reps    Heel Raises Limitations toe raises x 10    Gait Training 100 feet using RW throughout session    Other Standing Knee Exercises side stepping 4 RT in // bars      Knee/Hip Exercises: Seated   Long Arc Quad Both;1 set;15 reps    Sit to [employer] 10 reps;with UE support      Knee/Hip Exercises: Supine   Bridges 15 reps    Other Supine Knee/Hip Exercises hip abduction iso with ball 15 x 5", supine clam with BTB 15 x 5"                       PT Short Term Goals - 10/05/21 1523       PT SHORT TERM GOAL #1   Title Patient will be able to complete 5x STS in under 15 seconds in order to reduce the risk of falls.    Baseline 20 sec with BUE support    Time 3    Period Weeks    Status On-going    Target Date 10/20/21      PT SHORT TERM GOAL #2   Title Decrease risk for falls per TUG test time 12 sec with least restrictive AD    Baseline 23 sec w/RW    Time 3    Period Weeks     Status On-going    Target Date 10/20/21      PT SHORT TERM GOAL #3   Title Patient will report at least 25% improvement in symptoms for improved  quality of life.    Time 3    Period Weeks    Status New    Target Date 10/20/21               PT Long Term Goals - 10/19/21 1351       PT LONG TERM GOAL #1   Title Demo 4/5 BLE strength to improve safety and stability for standing ADL and gait    Baseline 3 to 4-/5 gross strength    Time 6    Period Weeks    Status On-going    Target Date 11/10/21      PT LONG TERM GOAL #2   Title Demo modified independent ambulation x 350 ft to improve functional ambulation    Baseline 87 ft CGA with RW    Time 6    Period Weeks    Status On-going    Target Date 11/10/21      PT LONG TERM GOAL #3   Title Ascend/descend curb with least restrictive AD to improve safety with ambulation    Baseline DNT    Time 6    Period Weeks    Status On-going    Target Date 11/10/21                   Plan - 10/21/21 1154     Clinical Impression Statement Patient tolerated session well today. Added band resistance to clamshell and seated LAQ for LE strengthening. Patient cued on hold times during isometric activity. Also cued on full knee extension during LAQs for quadriceps activation. Added to HEP and issued handout. Patient will continue to benefit from skilled therapy services to progress LE strength and balance for improved functional mobility.    Personal Factors and Comorbidities Age;Comorbidity 1;Time since onset of injury/illness/exacerbation    Comorbidities hx of multiple spine surgeries    Examination-Activity Limitations Bathing;Bend;Carry;Lift;Toileting;Stand;Stairs;Squat;Reach Overhead;Locomotion Level;Transfers    Examination-Participation Restrictions Cleaning;Yard Work;Meal Prep;Driving;Community Activity    Stability/Clinical Decision Making Evolving/Moderate complexity    Rehab Potential Good    PT Frequency 2x / week     PT Duration 6 weeks    PT Treatment/Interventions ADLs/Self Care Home Management;Electrical Stimulation;DME Instruction;Gait training;Stair training;Functional mobility training;Therapeutic activities;Therapeutic exercise;Balance training;Patient/family education;Neuromuscular re-education;Wheelchair mobility training;Manual techniques;Dry needling    PT Next Visit Plan Body-weight support treadmill, progress gait, balance and functional hip strengthening.    PT Home Exercise Plan SLR; 12/13: bridge and clam; 12/15: chair push up; 12/20 sit to stand; supine hip abduction 12/22 band clamshell, laq    Consulted and Agree with Plan of Care Patient             Patient will benefit from skilled therapeutic intervention in order to improve the following deficits and impairments:  Abnormal gait, Decreased activity tolerance, Decreased balance, Decreased mobility, Decreased knowledge of use of DME, Decreased endurance, Decreased coordination, Decreased strength, Difficulty walking, Postural dysfunction, Improper body mechanics, Pain  Visit Diagnosis: Difficulty in walking, not elsewhere classified  Muscle weakness (generalized)  Other abnormalities of gait and mobility     Problem List Patient Active Problem List   Diagnosis Date Noted   Cervical myelopathy ([place])    Hyponatremia    Neurogenic bladder    Pressure injury of skin 08/08/2021   Acute incomplete quadriplegia ([place]) 08/01/2021   Epidural hematoma 07/22/2021   Myelopathy ([place]) 07/19/2021   Callus 05/18/2021   Acute bacterial rhinosinusitis 10/05/2020   Cough in adult 10/05/2020   S/P CABG x 3 04/27/2018  Angina pectoris ([place]) 04/24/2018   Obesity    Chronic combined systolic and diastolic congestive heart failure ([place])    Peripheral edema 06/30/2017   Pedal edema 11/28/2016   Osteopenia 08/13/2015   Thrombocytopenia ([place]) 05/25/2015   Chronic ITP (idiopathic thrombocytopenia) ([place]) 05/25/2015   Hypothyroidism  08/29/2014   Diverticulitis of colon (without mention of hemorrhage)(562.11) 02/04/2014   Dysphagia, unspecified(787.20) 02/04/2014   Hyperlipidemia 11/18/2013   Dyspnea 01/02/2012   Palpitations 01/02/2012   Mixed hyperlipidemia 07/06/2011   Coronary artery disease involving native coronary artery of native heart with angina pectoris ([place])    TOTAL KNEE FOLLOW-UP 08/14/2008   Chronic back pain 07/23/2008   KNEE, ARTHRITIS, DEGEN./OSTEO 05/29/2008   JOINT EFFUSION, KNEE 04/30/2008   Pain in joint, lower leg 10/10/2007   Essential hypertension 10/09/2007   11:59 AM, 10/21/21 Josue Hector PT DPT  Physical Therapist with [facility]  [phone] [place], [place], [address] Phone: [phone]   Fax:  [phone]  Name: [name] MRN: [id number] Date of Birth: January 20, 1951

## 2021-11-03 ENCOUNTER — Encounter (HOSPITAL_COMMUNITY): Payer: Self-pay | Admitting: Occupational Therapy

## 2021-11-03 ENCOUNTER — Other Ambulatory Visit: Payer: Self-pay | Admitting: Family Medicine

## 2021-11-03 ENCOUNTER — Ambulatory Visit (HOSPITAL_COMMUNITY): Payer: Medicare Other

## 2021-11-03 ENCOUNTER — Other Ambulatory Visit: Payer: Self-pay

## 2021-11-03 ENCOUNTER — Ambulatory Visit (HOSPITAL_COMMUNITY): Payer: Medicare Other | Attending: Registered Nurse | Admitting: Occupational Therapy

## 2021-11-03 ENCOUNTER — Encounter (HOSPITAL_COMMUNITY): Payer: Self-pay

## 2021-11-03 DIAGNOSIS — R2689 Other abnormalities of gait and mobility: Secondary | ICD-10-CM | POA: Insufficient documentation

## 2021-11-03 DIAGNOSIS — R262 Difficulty in walking, not elsewhere classified: Secondary | ICD-10-CM | POA: Insufficient documentation

## 2021-11-03 DIAGNOSIS — R29818 Other symptoms and signs involving the nervous system: Secondary | ICD-10-CM | POA: Diagnosis not present

## 2021-11-03 DIAGNOSIS — R2681 Unsteadiness on feet: Secondary | ICD-10-CM | POA: Insufficient documentation

## 2021-11-03 DIAGNOSIS — M6281 Muscle weakness (generalized): Secondary | ICD-10-CM | POA: Insufficient documentation

## 2021-11-03 DIAGNOSIS — R278 Other lack of coordination: Secondary | ICD-10-CM | POA: Insufficient documentation

## 2021-11-03 NOTE — Therapy (Signed)
[facility] [address] [place], [place], [address] Phone: [phone]   Fax:  [phone]  Occupational Therapy Treatment  Patient Details  Name: [name] MRN: [id number] Date of Birth: Dec 30, 1950 Referring Provider (OT): Danella Sensing, NP   Encounter Date: 11/03/2021   OT End of Session - 11/03/21 1341     Visit Number 10    Number of Visits 16    Date for OT Re-Evaluation 11/13/21    Authorization Type UHC Medicare; $30 copay per discipline per visit    Authorization Time Period no visit limit    Progress Note Due on Visit 54    OT Start Time 1257    OT Stop Time 1343    OT Time Calculation (min) 46 min    Activity Tolerance Patient tolerated treatment well    Behavior During Therapy [facility] for tasks assessed/performed             Past Medical History:  Diagnosis Date   Allergy    Carpal tunnel syndrome    Chronic back pain    Chronic combined systolic and diastolic congestive heart failure (HCC)    Chronic ITP (idiopathic thrombocytopenia) ([place]) 05/25/2015   Coronary artery disease    Coronary artery disease involving native coronary artery of native heart with angina pectoris (HCC)    Degenerative disc disease    GERD (gastroesophageal reflux disease)    History of thrombocytopenia    Hypercholesteremia    Hypertension    Hypothyroid    Lumbar pain    Myocardial infarction ([place]) 2009   Obesity    Pneumonia    around age 18   S/P CABG x 3 04/27/2018   LIMA to LAD SVG to OM1 SVG to OM2   Shortness of breath dyspnea     Past Surgical History:  Procedure Laterality Date   ANTERIOR CERVICAL DECOMP/DISCECTOMY FUSION N/A 07/19/2021   Procedure: CERVICAL THREE-FOUR ANTERIOR CERVICAL DECOMPRESSION/DISCECTOMY FUSION;  Surgeon: [name], MD;  Location: [facility];  Service: Neurosurgery;  Laterality: N/A;   ANTERIOR CERVICAL DECOMP/DISCECTOMY FUSION N/A 07/22/2021   Procedure: ANTERIOR CERVICAL DECOMPRESSION/DISCECTOMY  REVISON FUSION  WITH EVACUATION OF EPIDURAL HEMATOMA;  Surgeon: [name], DO;  Location: [facility];  Service: Neurosurgery;  Laterality: N/A;   BACK SURGERY     5 lumbas disc with cervical and lumbar fusions   BIOPSY N/A 03/06/2014   Procedure: ESOPHAGEAL BIOPSIES;  Surgeon: [name], MD;  [facility];  Service: Endoscopy;  Laterality: N/A;   COLONOSCOPY     COLONOSCOPY WITH PROPOFOL N/A 03/06/2014   Procedure: COLONOSCOPY WITH PROPOFOL;  Surgeon: [name], MD;  Location[facility];  Service: Endoscopy;  Laterality: N/A;  in cecum at 0807; total withdrawal time 9 minutes   CORONARY ANGIOPLASTY WITH STENT PLACEMENT     2000, and 2004 has 3 stents   CORONARY ARTERY BYPASS GRAFT N/A 04/27/2018   Procedure: CORONARY ARTERY BYPASS GRAFTING (CABG) x Three , using left internal mammary artery and right leg greater saphenous vein;  Surgeon: [name], MD;  Location: [facility];  Service: Open Heart Surgery;  Laterality: N/A;   ESOPHAGOGASTRODUODENOSCOPY (EGD) WITH PROPOFOL N/A 03/06/2014   Procedure: ESOPHAGOGASTRODUODENOSCOPY (EGD) WITH PROPOFOL;  Surgeon: [name], MD;  [facility];  Service: Endoscopy;  Laterality: N/A;   LEFT HEART CATH AND CORONARY ANGIOGRAPHY N/A 04/24/2018   Procedure: LEFT HEART CATH AND CORONARY ANGIOGRAPHY;  Surgeon: [name], MD;  Location: [facility];  Service: Cardiovascular;  Laterality: N/A;   LUMBAR FUSION  2009   MALONEY DILATION N/A 03/06/2014   Procedure: MALONEY DILATION 54 french;  Surgeon: [name], MD;  [facility];  Service: Endoscopy;  Laterality: N/A;   NECK SURGERY     TEE WITHOUT CARDIOVERSION N/A 04/27/2018   Procedure: TRANSESOPHAGEAL ECHOCARDIOGRAM (TEE);  Surgeon: [name], MD;  Location: [facility];  Service: Open Heart Surgery;  Laterality: N/A;    There were no vitals filed for this visit.   Subjective Assessment - 11/03/21 1255     Subjective  S: It's popping a little bit.    Currently in  Pain? Yes    Pain Score 4     Pain Location Shoulder    Pain Orientation Right    Pain Descriptors / Indicators Aching;Sore    Pain Type Chronic pain    Pain Radiating Towards N/A    Pain Onset In the past 7 days    Pain Frequency Intermittent    Aggravating Factors  unsure    Pain Relieving Factors rest, stretching    Effect of Pain on Daily Activities min effect on ADLs    Multiple Pain Sites No                [facility] OT Assessment - 11/03/21 1255       Assessment   Medical Diagnosis Acute incomplete quadriplegia      Precautions   Precautions Cervical;Fall    Required Braces or Orthoses Cervical Brace    Cervical Brace Soft collar;For comfort                      OT Treatments/Exercises (OP) - 11/03/21 1300       Exercises   Exercises Hand;Theraputty      Hand Exercises   Hand Gripper with Large Beads all beads gripper at 42#, vertical    Hand Gripper with Medium Beads all beads gripper at 38#, vertical      Neurological Re-education Exercises   Shoulder Flexion AROM;Supine;AAROM;10 reps;Seated    Shoulder ABduction AROM;10 reps;Supine;Seated    Shoulder Protraction AROM;10 reps;Supine;Seated    Shoulder Horizontal ABduction AROM;10 reps;Supine    Shoulder External Rotation AROM;10 reps;Seated    Shoulder Internal Rotation AROM;10 reps;Seated    Other Exercises 1 proximal shoulder strengthening in supine, 10X each, 1 rest break      Manual Therapy   Manual Therapy Myofascial release    Manual therapy comments completed separately from therapeutic exercises    Myofascial Release myofascial release to right anterior shoulder and upper trapezius regions to decrease pain and fascial restrictions and increase joint ROM      Fine Motor Coordination (Hand/Wrist)   Fine Motor Coordination Grooved pegs    Grooved pegs Pt placing pegs with right hand, slightly increased time required today. Pt placing 12 pegs with more control than previous attempts.                       OT Short Term Goals - 10/12/21 1521       OT SHORT TERM GOAL #1   Title Pt will be provided with and educated on HEP to improve mobility of BUE required for ADL task completion.    Time 4    Period Weeks    Status On-going    Target Date 10/14/21      OT SHORT TERM GOAL #2  Title Pt will demonstrate RUE A/ROM WFL to improve ability to reach overhead and behind back during dressing tasks.    Time 4    Period Weeks    Status On-going      OT SHORT TERM GOAL #3   Title Pt will demonstrate improved fine motor coordination required for self-feeding by completing 9 hole peg test in under _0  for the left hand and in under 6' for the right hand.    Baseline 12/13: right hand 1'46", left hand _1     Time 4    Period Weeks    Status Partially Met      OT SHORT TERM GOAL #4   Title Pt will demonstrate improved grip strength of at least 35# required for holding items with right hand.    Time 4    Period Weeks    Status Achieved               OT Long Term Goals - 10/12/21 1522       OT LONG TERM GOAL #1   Title Pt will increase RUE strength to 4/5 or greater to improve ability to use RUE as dominant during simple ADLs.    Time 8    Period Weeks    Status On-going      OT LONG TERM GOAL #2   Title Pt will increase right grip strength to 40# or greater and pinch strength to 12# or greater to improve ability to complete functional tasks requiring sustained grip on objects.    Time 8    Period Weeks    Status Achieved      OT LONG TERM GOAL #3   Title Pt will increase fine motor coordination required for operating buttons or tying shoes by completing 9 hole peg test in under 1' with left hand and under 4' with right hand.    Baseline 12/13: right hand _2 , left hand _3     Time 8    Period Weeks    Status Revised      OT LONG TERM GOAL #4   Title Pt will be educated on AE available to faciliate independence in ADLs.    Time  8    Period Weeks    Status On-going                   Plan - 11/03/21 1335     Clinical Impression Statement A: Pt reports everything is getting easier to do using the right arm. Continued with myofascial release and RUE as well as P/ROM and A/ROM. Added AA/ROM in sitting for protraction and flexion, pt with mod/max difficulty reaching above shoulder height due to weakness. Continued with grip strengthening and resumed fine motor work today. Pt was able to complete more of the grooved pegboard requiring less time for completion today and demonstrates improved control. Verbal cuing for form and technique.    Body Structure / Function / Physical Skills ADL;Endurance;UE functional use;Pain;ROM;IADL;Strength;Mobility;Coordination    Plan P: Continue with seated AA/ROM, fine motor task. Attempte pinch tree    OT Home Exercise Plan 11/15: red theraputty exercises; 11/17: supine A/ROM; 11/21: hand A/ROM; 12/13: seated AA/ROM    Consulted and Agree with Plan of Care Patient             Patient will benefit from skilled therapeutic intervention in order to improve the following deficits and impairments:   Body Structure / Function / Physical Skills: ADL, Endurance, UE  functional use, Pain, ROM, IADL, Strength, Mobility, Coordination       Visit Diagnosis: Other lack of coordination  Other symptoms and signs involving the nervous system    Problem List Patient Active Problem List   Diagnosis Date Noted   Cervical myelopathy ([place])    Hyponatremia    Neurogenic bladder    Pressure injury of skin 08/08/2021   Acute incomplete quadriplegia ([place]) 08/01/2021   Epidural hematoma 07/22/2021   Myelopathy ([place]) 07/19/2021   Callus 05/18/2021   Acute bacterial rhinosinusitis 10/05/2020   Cough in adult 10/05/2020   S/P CABG x 3 04/27/2018   Angina pectoris ([place]) 04/24/2018   Obesity    Chronic combined systolic and diastolic congestive heart failure ([place])    Peripheral edema  06/30/2017   Pedal edema 11/28/2016   Osteopenia 08/13/2015   Thrombocytopenia ([place]) 05/25/2015   Chronic ITP (idiopathic thrombocytopenia) ([place]) 05/25/2015   Hypothyroidism 08/29/2014   Diverticulitis of colon (without mention of hemorrhage)(562.11) 02/04/2014   Dysphagia, unspecified(787.20) 02/04/2014   Hyperlipidemia 11/18/2013   Dyspnea 01/02/2012   Palpitations 01/02/2012   Mixed hyperlipidemia 07/06/2011   Coronary artery disease involving native coronary artery of native heart with angina pectoris ([place])    TOTAL KNEE FOLLOW-UP 08/14/2008   Chronic back pain 07/23/2008   KNEE, ARTHRITIS, DEGEN./OSTEO 05/29/2008   JOINT EFFUSION, KNEE 04/30/2008   Pain in joint, lower leg 10/10/2007   Essential hypertension 10/09/2007    Guadelupe Sabin, OTR/L  [phone] 11/03/2021, 1:46 PM  [place], [place], [address] Phone: [phone]   Fax:  [phone]  Name: [name] MRN: [id number] Date of Birth: [birth year]

## 2021-11-03 NOTE — Therapy (Signed)
[facility] [address] [place], [place], [address] Phone: [phone]   Fax:  [phone]  Physical Therapy Treatment  Patient Details  Name: [name] MRN: [id number] Date of Birth: [birth year] Referring Provider (PT): [name], NP   Encounter Date: 11/03/2021   PT End of Session - 11/03/21 1443     Visit Number 7    Number of Visits 12    Date for PT Re-Evaluation --   should have been done 10/29/21   Authorization Type UHC Medicare    Progress Note Due on Visit 10    PT Start Time 1402    PT Stop Time 1440    PT Time Calculation (min) 38 min    Equipment Utilized During Treatment Gait belt    Activity Tolerance Patient tolerated treatment well    Behavior During Therapy WFL for tasks assessed/performed             Past Medical History:  Diagnosis Date   Allergy    Carpal tunnel syndrome    Chronic back pain    Chronic combined systolic and diastolic congestive heart failure (HCC)    Chronic ITP (idiopathic thrombocytopenia) ([place]) 05/25/2015   Coronary artery disease    Coronary artery disease involving native coronary artery of native heart with angina pectoris ([place])    Degenerative disc disease    GERD (gastroesophageal reflux disease)    History of thrombocytopenia    Hypercholesteremia    Hypertension    Hypothyroid    Lumbar pain    Myocardial infarction ([place]) 2009   Obesity    Pneumonia    around age 61   S/P CABG x 3 04/27/2018   LIMA to LAD SVG to OM1 SVG to OM2   Shortness of breath dyspnea     Past Surgical History:  Procedure Laterality Date   ANTERIOR CERVICAL DECOMP/DISCECTOMY FUSION N/A 07/19/2021   Procedure: CERVICAL THREE-FOUR ANTERIOR CERVICAL DECOMPRESSION/DISCECTOMY FUSION;  Surgeon: [name], MD;  Location: [facility];  Service: Neurosurgery;  Laterality: N/A;   ANTERIOR CERVICAL DECOMP/DISCECTOMY FUSION N/A 07/22/2021   Procedure: ANTERIOR CERVICAL DECOMPRESSION/DISCECTOMY REVISON  FUSION  WITH EVACUATION OF EPIDURAL HEMATOMA;  Surgeon: [name], DO;  Location: [facility];  Service: Neurosurgery;  Laterality: N/A;   BACK SURGERY     5 lumbas disc with cervical and lumbar fusions   BIOPSY N/A 03/06/2014   Procedure: ESOPHAGEAL BIOPSIES;  Surgeon: [name], MD;  [facility];  Service: Endoscopy;  Laterality: N/A;   COLONOSCOPY     COLONOSCOPY WITH PROPOFOL N/A 03/06/2014   Procedure: COLONOSCOPY WITH PROPOFOL;  Surgeon: [name], MD;  [facility];  Service: Endoscopy;  Laterality: N/A;  in cecum at 0807; total withdrawal time 9 minutes   CORONARY ANGIOPLASTY WITH STENT PLACEMENT     2000, and 2004 has 3 stents   CORONARY ARTERY BYPASS GRAFT N/A 04/27/2018   Procedure: CORONARY ARTERY BYPASS GRAFTING (CABG) x Three , using left internal mammary artery and right leg greater saphenous vein;  Surgeon: [name], MD;  Location: [facility];  Service: Open Heart Surgery;  Laterality: N/A;   ESOPHAGOGASTRODUODENOSCOPY (EGD) WITH PROPOFOL N/A 03/06/2014   Procedure: ESOPHAGOGASTRODUODENOSCOPY (EGD) WITH PROPOFOL;  Surgeon: [name], MD;  [facility];  Service: Endoscopy;  Laterality: N/A;   LEFT HEART CATH AND CORONARY ANGIOGRAPHY N/A 04/24/2018   Procedure: LEFT HEART CATH AND CORONARY ANGIOGRAPHY;  Surgeon: [name], MD;  Location: [facility];  Service: Cardiovascular;  Laterality: N/A;   LUMBAR FUSION  2009   MALONEY DILATION N/A 03/06/2014   Procedure: MALONEY DILATION 54 french;  Surgeon: [name], MD;  [facility];  Service: Endoscopy;  Laterality: N/A;   NECK SURGERY     TEE WITHOUT CARDIOVERSION N/A 04/27/2018   Procedure: TRANSESOPHAGEAL ECHOCARDIOGRAM (TEE);  Surgeon: [name], MD;  Location: [facility];  Service: Open Heart Surgery;  Laterality: N/A;    There were no vitals filed for this visit.   Subjective Assessment - 11/03/21 1404     Subjective No reports of pain.  Noticed he has increased numbness  that last momentarily.    Pertinent History 71 y/o male who underwent C3/4 ACDF on 07/19/21 then presented back to the ED on 07/20/21 with cervical stenosis with right greater than left weakness due to C3-4 epidural hematoma; cervical myelopathy. Pt underwent C3/4 revision and evacuation of hematoma, Placement of intervertebral biomechanical device C3-4, globus interbody, and Placement of anterior instrumentation consisting of interbody plate and screws    Patient Stated Goals Be able to walk instead of using w/c; 12/13: ability to get up from floor    Currently in Pain? No/denies                               [facility] Adult PT Treatment/Exercise - 11/03/21 1408       Ambulation/Gait   Ambulation/Gait Yes    Ambulation/Gait Assistance 4: Min guard    Ambulation Distance (Feet) 226 Feet   172ft   Assistive device Rolling walker    Gait Pattern Trunk flexed;Wide base of support;Decreased dorsiflexion - right    Gait Comments WC behind for safety      Knee/Hip Exercises: Standing   Hip Flexion Both;5 sets;Knee bent    Hip Flexion Limitations 5" holds alternating marching wiht HHA    Other Standing Knee Exercises side stepping 4 RT front of mat      Knee/Hip Exercises: Seated   Sit to Sand 10 reps;without UE support   21in heihgt     Knee/Hip Exercises: Supine   Bridges 10 reps    Bridges Limitations 5" holds      Knee/Hip Exercises: Sidelying   Hip ABduction AAROM;Both;2 sets;5 reps    Clams 10                         PT Short Term Goals - 10/05/21 1523       PT SHORT TERM GOAL #1   Title Patient will be able to complete 5x STS in under 15 seconds in order to reduce the risk of falls.    Baseline 20 sec with BUE support    Time 3    Period Weeks    Status On-going    Target Date 10/20/21      PT SHORT TERM GOAL #2   Title Decrease risk for falls per TUG test time 12 sec with least restrictive AD    Baseline 23 sec w/RW    Time 3    Period  Weeks    Status On-going    Target Date 10/20/21      PT SHORT TERM GOAL #3   Title Patient will report at least 25% improvement in symptoms for improved quality of life.    Time 3    Period Weeks  Status New    Target Date 10/20/21               PT Long Term Goals - 10/19/21 1351       PT LONG TERM GOAL #1   Title Demo 4/5 BLE strength to improve safety and stability for standing ADL and gait    Baseline 3 to 4-/5 gross strength    Time 6    Period Weeks    Status On-going    Target Date 11/10/21      PT LONG TERM GOAL #2   Title Demo modified independent ambulation x 350 ft to improve functional ambulation    Baseline 87 ft CGA with RW    Time 6    Period Weeks    Status On-going    Target Date 11/10/21      PT LONG TERM GOAL #3   Title Ascend/descend curb with least restrictive AD to improve safety with ambulation    Baseline DNT    Time 6    Period Weeks    Status On-going    Target Date 11/10/21                   Plan - 11/03/21 1444     Clinical Impression Statement Added glut med strengthening in sidelying with tactile cueing and positioning to address specific mm, pt weak and required AAROM for proper activiation.  Improved activity tolerance noted with ability to ambulate 278ft with RW, cueing to address NBOS to assist with stability during gait.  Pt required 1 rest break and Rt knee buckled 1x required mod A for safety.    Personal Factors and Comorbidities Age;Comorbidity 1;Time since onset of injury/illness/exacerbation    Comorbidities hx of multiple spine surgeries    Examination-Activity Limitations Bathing;Bend;Carry;Lift;Toileting;Stand;Stairs;Squat;Reach Overhead;Locomotion Level;Transfers    Examination-Participation Restrictions Cleaning;Yard Work;Meal Prep;Driving;Community Activity    Stability/Clinical Decision Making Evolving/Moderate complexity    Clinical Decision Making Moderate    Rehab Potential Good    PT Frequency 2x /  week    PT Duration 6 weeks    PT Treatment/Interventions ADLs/Self Care Home Management;Electrical Stimulation;DME Instruction;Gait training;Stair training;Functional mobility training;Therapeutic activities;Therapeutic exercise;Balance training;Patient/family education;Neuromuscular re-education;Wheelchair mobility training;Manual techniques;Dry needling    PT Next Visit Plan Body-weight support treadmill, progress gait, balance and functional hip strengthening.    PT Home Exercise Plan SLR; 12/13: bridge and clam; 12/15: chair push up; 12/20 sit to stand; supine hip abduction 12/22 band clamshell, laq    Consulted and Agree with Plan of Care Patient             Patient will benefit from skilled therapeutic intervention in order to improve the following deficits and impairments:  Abnormal gait, Decreased activity tolerance, Decreased balance, Decreased mobility, Decreased knowledge of use of DME, Decreased endurance, Decreased coordination, Decreased strength, Difficulty walking, Postural dysfunction, Improper body mechanics, Pain  Visit Diagnosis: Difficulty in walking, not elsewhere classified  Muscle weakness (generalized)  Unsteadiness on feet  Other abnormalities of gait and mobility     Problem List Patient Active Problem List   Diagnosis Date Noted   Cervical myelopathy ([place])    Hyponatremia    Neurogenic bladder    Pressure injury of skin 08/08/2021   Acute incomplete quadriplegia ([place]) 08/01/2021   Epidural hematoma 07/22/2021   Myelopathy ([place]) 07/19/2021   Callus 05/18/2021   Acute bacterial rhinosinusitis 10/05/2020   Cough in adult 10/05/2020   S/P CABG x 3 04/27/2018   Angina pectoris ([place])  04/24/2018   Obesity    Chronic combined systolic and diastolic congestive heart failure ([place])    Peripheral edema 06/30/2017   Pedal edema 11/28/2016   Osteopenia 08/13/2015   Thrombocytopenia ([place]) 05/25/2015   Chronic ITP (idiopathic thrombocytopenia) ([place])  05/25/2015   Hypothyroidism 08/29/2014   Diverticulitis of colon (without mention of hemorrhage)(562.11) 02/04/2014   Dysphagia, unspecified(787.20) 02/04/2014   Hyperlipidemia 11/18/2013   Dyspnea 01/02/2012   Palpitations 01/02/2012   Mixed hyperlipidemia 07/06/2011   Coronary artery disease involving native coronary artery of native heart with angina pectoris ([place])    TOTAL KNEE FOLLOW-UP 08/14/2008   Chronic back pain 07/23/2008   KNEE, ARTHRITIS, DEGEN./OSTEO 05/29/2008   JOINT EFFUSION, KNEE 04/30/2008   Pain in joint, lower leg 10/10/2007   Essential hypertension 10/09/2007   Ihor Austin, LPTA/CLT; CBIS [phone]  Aldona Lento, PTA 11/03/2021, 2:49 PM  [facility], [place], [address] Phone: [phone]   Fax:  [phone]  Name: [name] MRN: [id number] Date of Birth: 01/21/1951

## 2021-11-09 ENCOUNTER — Other Ambulatory Visit: Payer: Self-pay

## 2021-11-09 ENCOUNTER — Ambulatory Visit (HOSPITAL_COMMUNITY): Payer: Medicare Other

## 2021-11-09 ENCOUNTER — Encounter (HOSPITAL_COMMUNITY): Payer: Self-pay

## 2021-11-09 DIAGNOSIS — M6281 Muscle weakness (generalized): Secondary | ICD-10-CM | POA: Diagnosis not present

## 2021-11-09 DIAGNOSIS — R29818 Other symptoms and signs involving the nervous system: Secondary | ICD-10-CM | POA: Diagnosis not present

## 2021-11-09 DIAGNOSIS — R278 Other lack of coordination: Secondary | ICD-10-CM | POA: Diagnosis not present

## 2021-11-09 DIAGNOSIS — R2689 Other abnormalities of gait and mobility: Secondary | ICD-10-CM

## 2021-11-09 DIAGNOSIS — R2681 Unsteadiness on feet: Secondary | ICD-10-CM

## 2021-11-09 DIAGNOSIS — R262 Difficulty in walking, not elsewhere classified: Secondary | ICD-10-CM

## 2021-11-09 NOTE — Therapy (Signed)
[facility] [address] [place], [place], [address] Phone: [phone]   Fax:  [phone]  Physical Therapy Treatment and Recertification and Progress Note  Patient Details  Name: [name] MRN: [id number] Date of Birth: May 17, 1951 Referring Provider (PT): Oscar Hugger, NP  Progress Note Reporting Period 09/29/21 to 11/09/21  See note below for Objective Data and Assessment of Progress/Goals.     Encounter Date: 11/09/2021   PT End of Session - 11/09/21 1342     Visit Number 8    Number of Visits 12    Date for PT Re-Evaluation 12/21/21    Authorization Type UHC Medicare    Progress Note Due on Visit 20    PT Start Time 1345    PT Stop Time 1430    PT Time Calculation (min) 45 min    Equipment Utilized During Treatment Gait belt    Activity Tolerance Patient tolerated treatment well    Behavior During Therapy WFL for tasks assessed/performed             Past Medical History:  Diagnosis Date   Allergy    Carpal tunnel syndrome    Chronic back pain    Chronic combined systolic and diastolic congestive heart failure (HCC)    Chronic ITP (idiopathic thrombocytopenia) (HCC) 05/25/2015   Coronary artery disease    Coronary artery disease involving native coronary artery of native heart with angina pectoris (HCC)    Degenerative disc disease    GERD (gastroesophageal reflux disease)    History of thrombocytopenia    Hypercholesteremia    Hypertension    Hypothyroid    Lumbar pain    Myocardial infarction ([place]) 2009   Obesity    Pneumonia    around age 71   S/P CABG x 3 04/27/2018   LIMA to LAD SVG to OM1 SVG to OM2   Shortness of breath dyspnea     Past Surgical History:  Procedure Laterality Date   ANTERIOR CERVICAL DECOMP/DISCECTOMY FUSION N/A 07/19/2021   Procedure: CERVICAL THREE-FOUR ANTERIOR CERVICAL DECOMPRESSION/DISCECTOMY FUSION;  Surgeon: Oscar Kos, MD;  Location: [facility];  Service: Neurosurgery;   Laterality: N/A;   ANTERIOR CERVICAL DECOMP/DISCECTOMY FUSION N/A 07/22/2021   Procedure: ANTERIOR CERVICAL DECOMPRESSION/DISCECTOMY REVISON FUSION  WITH EVACUATION OF EPIDURAL HEMATOMA;  Surgeon: Oscar Ro, DO;  Location: [facility];  Service: Neurosurgery;  Laterality: N/A;   BACK SURGERY     5 lumbas disc with cervical and lumbar fusions   BIOPSY N/A 03/06/2014   Procedure: ESOPHAGEAL BIOPSIES;  Surgeon: Oscar Houston, MD;  [facility]: [facility];  Service: Endoscopy;  Laterality: N/A;   COLONOSCOPY     COLONOSCOPY WITH PROPOFOL N/A 03/06/2014   Procedure: COLONOSCOPY WITH PROPOFOL;  Surgeon: Oscar Houston, MD;  Location: [facility];  Service: Endoscopy;  Laterality: N/A;  in cecum at 0807; total withdrawal time 9 minutes   CORONARY ANGIOPLASTY WITH STENT PLACEMENT     2000, and 2004 has 3 stents   CORONARY ARTERY BYPASS GRAFT N/A 04/27/2018   Procedure: CORONARY ARTERY BYPASS GRAFTING (CABG) x Three , using left internal mammary artery and right leg greater saphenous vein;  Surgeon: Oscar Alberts, MD;  Location: [facility];  Service: Open Heart Surgery;  Laterality: N/A;   ESOPHAGOGASTRODUODENOSCOPY (EGD) WITH PROPOFOL N/A 03/06/2014   Procedure: ESOPHAGOGASTRODUODENOSCOPY (EGD) WITH PROPOFOL;  Surgeon: Oscar Houston, MD;  [facility];  Service: Endoscopy;  Laterality: N/A;   LEFT HEART CATH  AND CORONARY ANGIOGRAPHY N/A 04/24/2018   Procedure: LEFT HEART CATH AND CORONARY ANGIOGRAPHY;  Surgeon: Oscar Booze, MD;  Location: [facility];  Service: Cardiovascular;  Laterality: N/A;   LUMBAR FUSION  2009   MALONEY DILATION N/A 03/06/2014   Procedure: MALONEY DILATION 54 french;  Surgeon: Oscar Houston, MD;  [facility];  Service: Endoscopy;  Laterality: N/A;   NECK SURGERY     TEE WITHOUT CARDIOVERSION N/A 04/27/2018   Procedure: TRANSESOPHAGEAL ECHOCARDIOGRAM (TEE);  Surgeon: Oscar Alberts, MD;  Location: [facility];  Service: Open Heart Surgery;  Laterality: N/A;    There were no  vitals filed for this visit.   Subjective Assessment - 11/09/21 1346     Subjective Been walking around the house with the walker more frequently. Legs feeling stronger    Pertinent History 71 y/o male who underwent C3/4 ACDF on 07/19/21 then presented back to the ED on 07/20/21 with cervical stenosis with right greater than left weakness due to C3-4 epidural hematoma; cervical myelopathy. Pt underwent C3/4 revision and evacuation of hematoma, Placement of intervertebral biomechanical device C3-4, globus interbody, and Placement of anterior instrumentation consisting of interbody plate and screws    Patient Stated Goals Be able to walk instead of using w/c; 12/13: ability to get up from floor    Currently in Pain? No/denies    Pain Score 0-No pain                [facility] PT Assessment - 11/09/21 1351       Strength   Right Hip Flexion 4/5    Left Hip Flexion 4/5    Right Knee Flexion 4-/5    Right Knee Extension 4-/5    Left Knee Flexion 4/5    Left Knee Extension 4/5    Right Ankle Dorsiflexion 4/5    Right Ankle Plantar Flexion 4-/5    Left Ankle Dorsiflexion 4/5    Left Ankle Plantar Flexion 4-/5      Transfers   Transfers Sit to Stand;Stand Pivot Transfers    Sit to Stand 6: Modified independent (Device/Increase time)    Five time sit to stand comments  53 sec without UE support, 18" seat height    Stand Pivot Transfers 6: Modified independent (Device/Increase time)      Ambulation/Gait   Ambulation/Gait Yes    Ambulation/Gait Assistance 5: Supervision    Ambulation Distance (Feet) 226 Feet    Assistive device Rolling walker    Gait Pattern Trunk flexed;Wide base of support;Decreased dorsiflexion - right    Stairs Yes    Stairs Assistance 4: Min guard    Stair Management Technique Two rails;Alternating pattern;Step to pattern    Number of Stairs 8    Curb 5: Supervision   CGA   Curb Details (indicate cue type and reason) CGA with RW    Gait Comments TUG: 19 sec, 16  sec                           [facility] Adult PT Treatment/Exercise - 11/09/21 1351       Knee/Hip Exercises: Standing   Heel Raises 2 sets;10 reps    Knee Flexion Strengthening;Both;3 sets;10 reps    Knee Flexion Limitations 10#    Hip Flexion Stengthening;Both;3 sets;10 reps    Hip Flexion Limitations 10#      Knee/Hip Exercises: Seated   Long Arc Quad Strengthening;Both;3 sets;10 reps  Long Arc Quad Weight 10 lbs.                       PT Short Term Goals - 11/09/21 1439       PT SHORT TERM GOAL #1   Title Patient will be able to complete 5x STS in under 15 seconds in order to reduce the risk of falls.    Baseline 20 sec with BUE support, increased time without UE support, 18" seat height    Time 3    Period Weeks    Status On-going    Target Date 11/23/21      PT SHORT TERM GOAL #2   Title Decrease risk for falls per TUG test time 12 sec with least restrictive AD    Baseline 16 sec with RW    Time 3    Period Weeks    Status On-going    Target Date 11/23/21      PT SHORT TERM GOAL #3   Title Patient will report at least 25% improvement in symptoms for improved quality of life.    Time 3    Period Weeks    Status New    Target Date 10/20/21      PT SHORT TERM GOAL #4   Title Negotiate curb and ascend/descend 12 stairs with supervision to improve safety with mobility    Baseline CGA with RW for curb, CGA with BHR stair ambulation x 8 stairs    Time 2    Period Weeks    Status New    Target Date 11/23/21               PT Long Term Goals - 11/09/21 1440       PT LONG TERM GOAL #1   Title Demo 4/5 BLE strength to improve safety and stability for standing ADL and gait    Baseline 4- to 4/5 gross BLE    Time 6    Period Weeks    Status Partially Met    Target Date 12/07/21      PT LONG TERM GOAL #2   Title Demo modified independent ambulation x 350 ft to improve functional ambulation    Baseline 226 ft with RW and  SBA-CGA    Time 6    Period Weeks    Status On-going    Target Date 12/07/21      PT LONG TERM GOAL #3   Title Ascend/descend curb with least restrictive AD to improve safety with ambulation    Baseline CGA with RW    Time 6    Period Weeks    Status On-going    Target Date 12/07/21                   Plan - 11/09/21 1445     Clinical Impression Statement Demonstrating a tremendous improvement in LE strength and functional activity tolerance with improvements in functional mobility as evidenced by increased gait speed and ability to negotiate curb and steps this reporting period.  Pt subjective report also endorses improvement as he has been able to increase ambulation within household and requires less assistance for mobility and ADL.  Pt would benefit from continued POC to enhance strength, coordination, balance, and ambulation to progress to functional, community-level ambulation.    Personal Factors and Comorbidities Age;Comorbidity 1;Time since onset of injury/illness/exacerbation    Comorbidities hx of multiple spine surgeries    Examination-Activity Limitations Bathing;Bend;Carry;Lift;Toileting;Stand;Stairs;Squat;Reach Overhead;Locomotion Level;Transfers  Examination-Participation Restrictions Cleaning;Yard Work;Meal Prep;Driving;Community Activity    Stability/Clinical Decision Making Evolving/Moderate complexity    Rehab Potential Good    PT Frequency 2x / week    PT Duration 6 weeks    PT Treatment/Interventions ADLs/Self Care Home Management;Electrical Stimulation;DME Instruction;Gait training;Stair training;Functional mobility training;Therapeutic activities;Therapeutic exercise;Balance training;Patient/family education;Neuromuscular re-education;Wheelchair mobility training;Manual techniques;Dry needling    PT Next Visit Plan Body-weight support treadmill, progress gait, balance and functional hip strengthening.    PT Home Exercise Plan SLR; 12/13: bridge and clam;  12/15: chair push up; 12/20 sit to stand; supine hip abduction 12/22 band clamshell, laq    Consulted and Agree with Plan of Care Patient             Patient will benefit from skilled therapeutic intervention in order to improve the following deficits and impairments:  Abnormal gait, Decreased activity tolerance, Decreased balance, Decreased mobility, Decreased knowledge of use of DME, Decreased endurance, Decreased coordination, Decreased strength, Difficulty walking, Postural dysfunction, Improper body mechanics, Pain  Visit Diagnosis: Difficulty in walking, not elsewhere classified  Muscle weakness (generalized)  Unsteadiness on feet  Other abnormalities of gait and mobility     Problem List Patient Active Problem List   Diagnosis Date Noted   Cervical myelopathy ([place])    Hyponatremia    Neurogenic bladder    Pressure injury of skin 08/08/2021   Acute incomplete quadriplegia ([place]) 08/01/2021   Epidural hematoma 07/22/2021   Myelopathy ([place]) 07/19/2021   Callus 05/18/2021   Acute bacterial rhinosinusitis 10/05/2020   Cough in adult 10/05/2020   S/P CABG x 3 04/27/2018   Angina pectoris ([place]) 04/24/2018   Obesity    Chronic combined systolic and diastolic congestive heart failure ([place])    Peripheral edema 06/30/2017   Pedal edema 11/28/2016   Osteopenia 08/13/2015   Thrombocytopenia ([place]) 05/25/2015   Chronic ITP (idiopathic thrombocytopenia) ([place]) 05/25/2015   Hypothyroidism 08/29/2014   Diverticulitis of colon (without mention of hemorrhage)(562.11) 02/04/2014   Dysphagia, unspecified(787.20) 02/04/2014   Hyperlipidemia 11/18/2013   Dyspnea 01/02/2012   Palpitations 01/02/2012   Mixed hyperlipidemia 07/06/2011   Coronary artery disease involving native coronary artery of native heart with angina pectoris ([place])    TOTAL KNEE FOLLOW-UP 08/14/2008   Chronic back pain 07/23/2008   KNEE, ARTHRITIS, DEGEN./OSTEO 05/29/2008   JOINT EFFUSION, KNEE 04/30/2008   Pain  in joint, lower leg 10/10/2007   Essential hypertension 10/09/2007    Toniann Fail, PT 11/09/2021, 2:47 PM  [facility] [address] [place], [place], [address] Phone: [phone]   Fax:  [phone]  Name: ADELBERT GASPARD MRN: [id number] Date of Birth: [birth year]

## 2021-11-09 NOTE — Therapy (Signed)
[facility] [address] [place], [place], [address] Phone: [phone]   Fax:  [phone]  Occupational Therapy Treatment  Patient Details  Name: [name] MRN: [id number] Date of Birth: 26-Dec-1950 Referring Provider (OT): Danella Sensing, NP   Encounter Date: 11/09/2021   OT End of Session - 11/09/21 1412     Visit Number 11    Number of Visits 16    Date for OT Re-Evaluation 11/13/21    Authorization Type UHC Medicare; $30 copay per discipline per visit    Authorization Time Period no visit limit    Progress Note Due on Visit 17    OT Start Time 1300    OT Stop Time 1340    OT Time Calculation (min) 40 min    Activity Tolerance Patient tolerated treatment well    Behavior During Therapy [facility] for tasks assessed/performed             Past Medical History:  Diagnosis Date   Allergy    Carpal tunnel syndrome    Chronic back pain    Chronic combined systolic and diastolic congestive heart failure (HCC)    Chronic ITP (idiopathic thrombocytopenia) (HCC) 05/25/2015   Coronary artery disease    Coronary artery disease involving native coronary artery of native heart with angina pectoris (HCC)    Degenerative disc disease    GERD (gastroesophageal reflux disease)    History of thrombocytopenia    Hypercholesteremia    Hypertension    Hypothyroid    Lumbar pain    Myocardial infarction ([place]) 2009   Obesity    Pneumonia    around age 64   S/P CABG x 3 04/27/2018   LIMA to LAD SVG to OM1 SVG to OM2   Shortness of breath dyspnea     Past Surgical History:  Procedure Laterality Date   ANTERIOR CERVICAL DECOMP/DISCECTOMY FUSION N/A 07/19/2021   Procedure: CERVICAL THREE-FOUR ANTERIOR CERVICAL DECOMPRESSION/DISCECTOMY FUSION;  Surgeon: [name], MD;  Location: [facility];  Service: Neurosurgery;  Laterality: N/A;   ANTERIOR CERVICAL DECOMP/DISCECTOMY FUSION N/A 07/22/2021   Procedure: ANTERIOR CERVICAL DECOMPRESSION/DISCECTOMY  REVISON FUSION  WITH EVACUATION OF EPIDURAL HEMATOMA;  Surgeon: [name], DO;  Location: [facility];  Service: Neurosurgery;  Laterality: N/A;   BACK SURGERY     5 lumbas disc with cervical and lumbar fusions   BIOPSY N/A 03/06/2014   Procedure: ESOPHAGEAL BIOPSIES;  Surgeon: [name], MD;  [facility];  Service: Endoscopy;  Laterality: N/A;   COLONOSCOPY     COLONOSCOPY WITH PROPOFOL N/A 03/06/2014   Procedure: COLONOSCOPY WITH PROPOFOL;  Surgeon: [name], MD;  [facility];  Service: Endoscopy;  Laterality: N/A;  in cecum at 0807; total withdrawal time 9 minutes   CORONARY ANGIOPLASTY WITH STENT PLACEMENT     2000, and 2004 has 3 stents   CORONARY ARTERY BYPASS GRAFT N/A 04/27/2018   Procedure: CORONARY ARTERY BYPASS GRAFTING (CABG) x Three , using left internal mammary artery and right leg greater saphenous vein;  Surgeon: [name], MD;  Location: [facility];  Service: Open Heart Surgery;  Laterality: N/A;   ESOPHAGOGASTRODUODENOSCOPY (EGD) WITH PROPOFOL N/A 03/06/2014   Procedure: ESOPHAGOGASTRODUODENOSCOPY (EGD) WITH PROPOFOL;  Surgeon: [name], MD;  [facility];  Service: Endoscopy;  Laterality: N/A;   LEFT HEART CATH AND CORONARY ANGIOGRAPHY N/A 04/24/2018   Procedure: LEFT HEART CATH AND CORONARY ANGIOGRAPHY;  Surgeon: [name], MD;  Location: [facility];  Service: Cardiovascular;  Laterality: N/A;   LUMBAR FUSION  2009   MALONEY DILATION N/A 03/06/2014   Procedure: MALONEY DILATION 54 french;  Surgeon: [name], MD;  [facility];  Service: Endoscopy;  Laterality: N/A;   NECK SURGERY     TEE WITHOUT CARDIOVERSION N/A 04/27/2018   Procedure: TRANSESOPHAGEAL ECHOCARDIOGRAM (TEE);  Surgeon: [name], MD;  Location: [facility];  Service: Open Heart Surgery;  Laterality: N/A;    There were no vitals filed for this visit.   Subjective Assessment - 11/09/21 1309     Subjective  S: I can now open a bottle which I couldn't do  before.    Currently in Pain? No/denies                [facility] OT Assessment - 11/09/21 1310       Assessment   Medical Diagnosis Acute incomplete quadriplegia      Precautions   Precautions Cervical;Fall    Required Braces or Orthoses Cervical Brace    Cervical Brace Soft collar;For comfort                      OT Treatments/Exercises (OP) - 11/09/21 1311       Exercises   Exercises Hand;Shoulder      Shoulder Exercises: Supine   Protraction PROM;5 reps;AROM;12 reps    Horizontal ABduction PROM;5 reps;AROM;12 reps    External Rotation PROM;5 reps;AROM;12 reps    Internal Rotation PROM;5 reps;AROM;12 reps    Flexion PROM;5 reps;AROM;12 reps    ABduction PROM;5 reps;AROM;12 reps      Shoulder Exercises: Seated   Protraction AAROM;10 reps    Horizontal ABduction AAROM;10 reps    External Rotation AROM;10 reps    Internal Rotation AROM;10 reps    Flexion AAROM;10 reps    Abduction AAROM;10 reps      Shoulder Exercises: ROM/Strengthening   Proximal Shoulder Strengthening, Supine A/ROM 10X no rest breaks      Hand Exercises   Other Hand Exercises Resistive clothespins utilized to focus on increasing pinch strength. Yellow, red all placed on vertical pole. Green, blue, and black pins placed on top and middle horizontal pole. All pins removed once placed.      Manual Therapy   Manual Therapy Myofascial release    Manual therapy comments completed separately from therapeutic exercises    Myofascial Release myofascial release to right anterior shoulder and upper trapezius regions to decrease pain and fascial restrictions and increase joint ROM      Fine Motor Coordination (Hand/Wrist)   Fine Motor Coordination Flipping cards;Dealing card with thumb    Flipping cards Pt used right hand to flip one card from deck at time. Difficult increased with smaller deck; otherwise no increased time was needed initially.    Dealing card with thumb Patient used right hand  to deal cards one at a time.                      OT Short Term Goals - 11/09/21 1411       OT SHORT TERM GOAL #1   Title Pt will be provided with and educated on HEP to improve mobility of BUE required for ADL task completion.    Time 4    Period Weeks    Status On-going    Target Date 10/14/21      OT SHORT TERM GOAL #2   Title  Pt will demonstrate RUE A/ROM WFL to improve ability to reach overhead and behind back during dressing tasks.    Time 4    Period Weeks    Status On-going      OT SHORT TERM GOAL #3   Title Pt will demonstrate improved fine motor coordination required for self-feeding by completing 9 hole peg test in under 1' 15"  for the left hand and in under 6' for the right hand.    Baseline 12/13: right hand 1'46", left hand 1' 25"     Time 4    Period Weeks    Status Partially Met      OT SHORT TERM GOAL #4   Title Pt will demonstrate improved grip strength of at least 35# required for holding items with right hand.    Time 4    Period Weeks               OT Long Term Goals - 11/09/21 1411       OT LONG TERM GOAL #1   Title Pt will increase RUE strength to 4/5 or greater to improve ability to use RUE as dominant during simple ADLs.    Time 8    Period Weeks    Status On-going      OT LONG TERM GOAL #2   Title Pt will increase right grip strength to 40# or greater and pinch strength to 12# or greater to improve ability to complete functional tasks requiring sustained grip on objects.    Time 8    Period Weeks      OT LONG TERM GOAL #3   Title Pt will increase fine motor coordination required for operating buttons or tying shoes by completing 9 hole peg test in under 1' with left hand and under 4' with right hand.    Baseline 12/13: right hand 1' 49" , left hand 1' 25"     Time 8    Period Weeks    Status On-going      OT LONG TERM GOAL #4   Title Pt will be educated on AE available to faciliate independence in ADLs.    Time 8     Period Weeks    Status On-going                   Plan - 11/09/21 1342     Clinical Impression Statement A: Completed myofascial release to right upper trapezius region although only trace fascial restrictions were noted this session. Used PVC pipe seated to compelte AA/ROM and provided modification when needed to increase form and technique. Completed pinch tree while focusing on hand strength and shoulder strength. Increased time needed to complete with rest breaks taken as needed due to muscle fatigue.    Body Structure / Function / Physical Skills ADL;Endurance;UE functional use;Pain;ROM;IADL;Strength;Mobility;Coordination    Plan P: Reassessment and re-cert. Provide HEP for coordination.    Consulted and Agree with Plan of Care Patient             Patient will benefit from skilled therapeutic intervention in order to improve the following deficits and impairments:   Body Structure / Function / Physical Skills: ADL, Endurance, UE functional use, Pain, ROM, IADL, Strength, Mobility, Coordination       Visit Diagnosis: Other lack of coordination  Other symptoms and signs involving the nervous system    Problem List Patient Active Problem List   Diagnosis Date Noted   Cervical myelopathy ([place])    Hyponatremia  Neurogenic bladder    Pressure injury of skin 08/08/2021   Acute incomplete quadriplegia ([place]) 08/01/2021   Epidural hematoma 07/22/2021   Myelopathy ([place]) 07/19/2021   Callus 05/18/2021   Acute bacterial rhinosinusitis 10/05/2020   Cough in adult 10/05/2020   S/P CABG x 3 04/27/2018   Angina pectoris ([place]) 04/24/2018   Obesity    Chronic combined systolic and diastolic congestive heart failure ([place])    Peripheral edema 06/30/2017   Pedal edema 11/28/2016   Osteopenia 08/13/2015   Thrombocytopenia ([place]) 05/25/2015   Chronic ITP (idiopathic thrombocytopenia) ([place]) 05/25/2015   Hypothyroidism 08/29/2014   Diverticulitis of colon (without mention  of hemorrhage)(562.11) 02/04/2014   Dysphagia, unspecified(787.20) 02/04/2014   Hyperlipidemia 11/18/2013   Dyspnea 01/02/2012   Palpitations 01/02/2012   Mixed hyperlipidemia 07/06/2011   Coronary artery disease involving native coronary artery of native heart with angina pectoris ([place])    TOTAL KNEE FOLLOW-UP 08/14/2008   Chronic back pain 07/23/2008   KNEE, ARTHRITIS, DEGEN./OSTEO 05/29/2008   JOINT EFFUSION, KNEE 04/30/2008   Pain in joint, lower leg 10/10/2007   Essential hypertension 10/09/2007    Ailene Ravel, OTR/L,CBIS  [phone]  11/09/2021, 2:12 PM  [facility] [place], [place], [address] Phone: [phone]   Fax:  [phone]  Name: [name] MRN: [id number] Date of Birth: 09-Oct-1951

## 2021-11-11 ENCOUNTER — Ambulatory Visit (HOSPITAL_COMMUNITY): Payer: Medicare Other | Admitting: Occupational Therapy

## 2021-11-11 ENCOUNTER — Ambulatory Visit (HOSPITAL_COMMUNITY): Payer: Medicare Other | Admitting: Physical Therapy

## 2021-11-11 ENCOUNTER — Encounter (HOSPITAL_COMMUNITY): Payer: Self-pay | Admitting: Occupational Therapy

## 2021-11-11 ENCOUNTER — Other Ambulatory Visit: Payer: Self-pay

## 2021-11-11 DIAGNOSIS — R29818 Other symptoms and signs involving the nervous system: Secondary | ICD-10-CM

## 2021-11-11 DIAGNOSIS — R278 Other lack of coordination: Secondary | ICD-10-CM

## 2021-11-11 DIAGNOSIS — R2689 Other abnormalities of gait and mobility: Secondary | ICD-10-CM

## 2021-11-11 DIAGNOSIS — M6281 Muscle weakness (generalized): Secondary | ICD-10-CM

## 2021-11-11 DIAGNOSIS — R2681 Unsteadiness on feet: Secondary | ICD-10-CM | POA: Diagnosis not present

## 2021-11-11 DIAGNOSIS — R262 Difficulty in walking, not elsewhere classified: Secondary | ICD-10-CM | POA: Diagnosis not present

## 2021-11-11 NOTE — Therapy (Signed)
[facility] [address] [place], [place], [address] Phone: [phone]   Fax:  [phone]  Occupational Therapy Reassessment, Treatment, Recertification  Patient Details  Name: [name] MRN: [id number] Date of Birth: 09-15-1951 Referring Provider (OT): Danella Sensing, NP   Encounter Date: 11/11/2021   OT End of Session - 11/11/21 1638     Visit Number 12    Number of Visits 20    Date for OT Re-Evaluation 12/11/21    Authorization Type UHC Medicare; $30 copay per discipline per visit    Authorization Time Period no visit limit    Progress Note Due on Visit 33    OT Start Time 1346    OT Stop Time 1428    OT Time Calculation (min) 42 min    Activity Tolerance Patient tolerated treatment well    Behavior During Therapy [facility] for tasks assessed/performed             Past Medical History:  Diagnosis Date   Allergy    Carpal tunnel syndrome    Chronic back pain    Chronic combined systolic and diastolic congestive heart failure (HCC)    Chronic ITP (idiopathic thrombocytopenia) ([place]) 05/25/2015   Coronary artery disease    Coronary artery disease involving native coronary artery of native heart with angina pectoris (HCC)    Degenerative disc disease    GERD (gastroesophageal reflux disease)    History of thrombocytopenia    Hypercholesteremia    Hypertension    Hypothyroid    Lumbar pain    Myocardial infarction ([place]) 2009   Obesity    Pneumonia    around age 44   S/P CABG x 3 04/27/2018   LIMA to LAD SVG to OM1 SVG to OM2   Shortness of breath dyspnea     Past Surgical History:  Procedure Laterality Date   ANTERIOR CERVICAL DECOMP/DISCECTOMY FUSION N/A 07/19/2021   Procedure: CERVICAL THREE-FOUR ANTERIOR CERVICAL DECOMPRESSION/DISCECTOMY FUSION;  Surgeon: [name], MD;  Location: [facility];  Service: Neurosurgery;  Laterality: N/A;   ANTERIOR CERVICAL DECOMP/DISCECTOMY FUSION N/A 07/22/2021   Procedure: ANTERIOR  CERVICAL DECOMPRESSION/DISCECTOMY REVISON FUSION  WITH EVACUATION OF EPIDURAL HEMATOMA;  Surgeon: [name], DO;  Location: [facility];  Service: Neurosurgery;  Laterality: N/A;   BACK SURGERY     5 lumbas disc with cervical and lumbar fusions   BIOPSY N/A 03/06/2014   Procedure: ESOPHAGEAL BIOPSIES;  Surgeon: [name], MD;  [facility];  Service: Endoscopy;  Laterality: N/A;   COLONOSCOPY     COLONOSCOPY WITH PROPOFOL N/A 03/06/2014   Procedure: COLONOSCOPY WITH PROPOFOL;  Surgeon: [name], MD;  [facility];  Service: Endoscopy;  Laterality: N/A;  in cecum at 0807; total withdrawal time 9 minutes   CORONARY ANGIOPLASTY WITH STENT PLACEMENT     2000, and 2004 has 3 stents   CORONARY ARTERY BYPASS GRAFT N/A 04/27/2018   Procedure: CORONARY ARTERY BYPASS GRAFTING (CABG) x Three , using left internal mammary artery and right leg greater saphenous vein;  Surgeon: [name], MD;  Location: [facility];  Service: Open Heart Surgery;  Laterality: N/A;   ESOPHAGOGASTRODUODENOSCOPY (EGD) WITH PROPOFOL N/A 03/06/2014   Procedure: ESOPHAGOGASTRODUODENOSCOPY (EGD) WITH PROPOFOL;  Surgeon: [name], MD;  [facility];  Service: Endoscopy;  Laterality: N/A;   LEFT HEART CATH AND CORONARY ANGIOGRAPHY N/A 04/24/2018   Procedure: LEFT HEART CATH AND CORONARY ANGIOGRAPHY;  Surgeon: [name],  MD;  Location: [facility];  Service: Cardiovascular;  Laterality: N/A;   LUMBAR FUSION  2009   MALONEY DILATION N/A 03/06/2014   Procedure: MALONEY DILATION 54 french;  Surgeon: [name], MD;  [facility];  Service: Endoscopy;  Laterality: N/A;   NECK SURGERY     TEE WITHOUT CARDIOVERSION N/A 04/27/2018   Procedure: TRANSESOPHAGEAL ECHOCARDIOGRAM (TEE);  Surgeon: [name], MD;  Location: [facility];  Service: Open Heart Surgery;  Laterality: N/A;    There were no vitals filed for this visit.   Subjective Assessment - 11/11/21 1347     Subjective  S: I've  started being able to put my belt through the loops.    Currently in Pain? No/denies                [facility] OT Assessment - 11/11/21 1347       Assessment   Medical Diagnosis Acute incomplete quadriplegia      Precautions   Precautions Cervical;Fall    Required Braces or Orthoses Cervical Brace    Cervical Brace Soft collar;For comfort      Coordination   Right 9 Hole Peg Test 1'34"   3'53" previous     AROM   Overall AROM Comments Assessed seated, er/IR adducted    AROM Assessment Site Shoulder    Right/Left Shoulder Right    Right Shoulder Flexion 92 Degrees   86 previous   Right Shoulder ABduction 139 Degrees   80 previous   Right Shoulder Internal Rotation 90 Degrees   same as previous   Right Shoulder External Rotation 65 Degrees   52 previous     Strength   Overall Strength Comments Assessed seated, er/IR adducted    Strength Assessment Site Shoulder    Right/Left Shoulder Right    Right Shoulder Flexion 3-/5   same as previous   Right Shoulder ABduction 3/5   3-/5 previous   Right Shoulder Internal Rotation 4+/5   same as previous   Right Shoulder External Rotation 3/5   3-/5 previous   Right/Left Elbow Right    Right Elbow Flexion 5/5   4+/5 previous   Right Elbow Extension 5/5   4+/5 previous   Right/Left Forearm Right    Right Forearm Pronation 5/5   4-/5 previous   Right Forearm Supination 4+/5   3+/5 previous   Right/Left Wrist Right    Right Wrist Flexion 4+/5   same as previous   Right Wrist Extension 5/5   same as previous   Right/Left hand Right    Right Hand Gross Grasp Functional    Right Hand Grip (lbs) 80   75 previous   Right Hand Lateral Pinch 18 lbs   17 previous   Right Hand 3 Point Pinch 16 lbs   same as previous                     OT Treatments/Exercises (OP) - 11/11/21 1455       Exercises   Exercises Hand;Shoulder      Neurological Re-education Exercises   Shoulder Flexion AAROM;5 reps;Seated    Shoulder  ABduction AAROM;10 reps;Seated    Shoulder Protraction AAROM;10 reps;Seated    Shoulder Horizontal ABduction AAROM;10 reps;Seated    Shoulder External Rotation AROM;10 reps;Seated    Shoulder Internal Rotation AROM;10 reps;Seated    Other Exercises 2 Working on scooping for self-feeding. Pt scooping a specified number of beads: 4, 6, and 3. Pt  with more difficulty with 3.                      OT Short Term Goals - 11/11/21 1403       OT SHORT TERM GOAL #1   Title Pt will be provided with and educated on HEP to improve mobility of BUE required for ADL task completion.    Time 4    Period Weeks    Status Achieved    Target Date 10/14/21      OT SHORT TERM GOAL #2   Title Pt will demonstrate RUE A/ROM WFL to improve ability to reach overhead and behind back during dressing tasks.    Time 4    Period Weeks    Status Partially Met      OT SHORT TERM GOAL #3   Title Pt will demonstrate improved fine motor coordination required for self-feeding by completing 9 hole peg test in under _0  for the left hand and in under 6' for the right hand.    Baseline 12/13: right hand 1'46", left hand _1  1/12: right hand 1'34"; left hand 53.58"    Time 4    Period Weeks    Status Achieved      OT SHORT TERM GOAL #4   Title Pt will demonstrate improved grip strength of at least 35# required for holding items with right hand.    Time 4    Period Weeks               OT Long Term Goals - 11/11/21 1405       OT LONG TERM GOAL #1   Title Pt will increase RUE strength to 4-/5 or greater to improve ability to use RUE as dominant during simple ADLs.    Time 8    Period Weeks    Status Revised      OT LONG TERM GOAL #2   Title Pt will increase right grip strength to 40# or greater and pinch strength to 12# or greater to improve ability to complete functional tasks requiring sustained grip on objects.    Time 8    Period Weeks      OT LONG TERM GOAL #3   Title Pt will  increase fine motor coordination required for operating buttons or tying shoes by completing 9 hole peg test in under 50' with left hand and under 1'10" with right hand.    Baseline 12/13: right hand _2 , left hand _3 ; 1/12: right hand 1'34" left hand 53.58"    Time 8    Period Weeks    Status Revised      OT LONG TERM GOAL #4   Title Pt will be educated on AE available to faciliate independence in ADLs.    Time 8    Period Weeks    Status On-going                   Plan - 11/11/21 1410     Clinical Impression Statement A: Reassessment completed this date. Pt is making great progress during therapy and has met 3/4 STGs with remaining goal partially met, as well as 2/4 LTGs. 2 LTGs have been revised to the appropriate challenge level. Pt continues to demonstrate significant right shoulder weakness and pain with activities over 90 degrees flexion. Pt reports he has had a lot of falls prior to the hematoma, always landing on his right shoulder. Question rotator cuff tear  or similar as cause for his pain and weakness. Pt continues to have difficulty with fine motor coordination and motor control, sensation is improving however continues to be impaired. Session today working on scooping to simulate feeding tasks.    OT Occupational Profile and History Detailed Assessment- Review of Records and additional review of physical, cognitive, psychosocial history related to current functional performance    Occupational performance deficits (Please refer to evaluation for details): ADL's;IADL's;Leisure    Body Structure / Function / Physical Skills ADL;Endurance;UE functional use;Pain;ROM;IADL;Strength;Mobility;Coordination    Rehab Potential Good    Clinical Decision Making Several treatment options, min-mod task modification necessary    Comorbidities Affecting Occupational Performance: May have comorbidities impacting occupational performance    Modification or Assistance to Complete  Evaluation  Min-Moderate modification of tasks or assist with assess necessary to complete eval    OT Frequency 2x / week    OT Duration 4 weeks    OT Treatment/Interventions Self-care/ADL training;Ultrasound;DME and/or AE instruction;Patient/family education;Passive range of motion;Electrical Stimulation;Splinting;Therapist, nutritional;Therapeutic exercise;Manual Therapy;Therapeutic activities    Plan P: Pt will benefit from continued skilled OT services to improve RUE strength, coordination, and motor control required for use during ADL tasks. Next session: provide HEP for coordination work, trial fine motor task combined with functional reaching for motor control work    [place] 11/15: red theraputty exercises; 11/17: supine A/ROM; 11/21: hand A/ROM; 12/13: seated AA/ROM    Consulted and Agree with Plan of Care Patient             Patient will benefit from skilled therapeutic intervention in order to improve the following deficits and impairments:   Body Structure / Function / Physical Skills: ADL, Endurance, UE functional use, Pain, ROM, IADL, Strength, Mobility, Coordination       Visit Diagnosis: Other lack of coordination  Other symptoms and signs involving the nervous system    Problem List Patient Active Problem List   Diagnosis Date Noted   Cervical myelopathy ([place])    Hyponatremia    Neurogenic bladder    Pressure injury of skin 08/08/2021   Acute incomplete quadriplegia ([place]) 08/01/2021   Epidural hematoma 07/22/2021   Myelopathy ([place]) 07/19/2021   Callus 05/18/2021   Acute bacterial rhinosinusitis 10/05/2020   Cough in adult 10/05/2020   S/P CABG x 3 04/27/2018   Angina pectoris ([place]) 04/24/2018   Obesity    Chronic combined systolic and diastolic congestive heart failure (HCC)    Peripheral edema 06/30/2017   Pedal edema 11/28/2016   Osteopenia 08/13/2015   Thrombocytopenia ([place]) 05/25/2015   Chronic ITP (idiopathic thrombocytopenia)  ([place]) 05/25/2015   Hypothyroidism 08/29/2014   Diverticulitis of colon (without mention of hemorrhage)(562.11) 02/04/2014   Dysphagia, unspecified(787.20) 02/04/2014   Hyperlipidemia 11/18/2013   Dyspnea 01/02/2012   Palpitations 01/02/2012   Mixed hyperlipidemia 07/06/2011   Coronary artery disease involving native coronary artery of native heart with angina pectoris ([place])    TOTAL KNEE FOLLOW-UP 08/14/2008   Chronic back pain 07/23/2008   KNEE, ARTHRITIS, DEGEN./OSTEO 05/29/2008   JOINT EFFUSION, KNEE 04/30/2008   Pain in joint, lower leg 10/10/2007   Essential hypertension 10/09/2007    Guadelupe Sabin, OTR/L  [phone] 11/11/2021, 4:40 PM   [facility] [place], [place], [address] Phone: [phone]   Fax:  [phone]  Name: [name] MRN: [id number] Date of Birth: 05-Jun-1951

## 2021-11-11 NOTE — Therapy (Signed)
[facility] [address] [place], [place], [address] Phone: [phone]   Fax:  [phone]  Physical Therapy Treatment  Patient Details  Name: FIDENCIO DUDDY MRN: [id number] Date of Birth: [birth year] Referring Provider (PT): [name], NP   Encounter Date: 11/11/2021   PT End of Session - 11/11/21 1427     Visit Number 9    Number of Visits 12    Date for PT Re-Evaluation 12/21/21    Authorization Type UHC Medicare    Progress Note Due on Visit 3    PT Start Time 1428    PT Stop Time 1515    PT Time Calculation (min) 47 min    Equipment Utilized During Treatment Gait belt    Activity Tolerance Patient tolerated treatment well    Behavior During Therapy WFL for tasks assessed/performed             Past Medical History:  Diagnosis Date   Allergy    Carpal tunnel syndrome    Chronic back pain    Chronic combined systolic and diastolic congestive heart failure (HCC)    Chronic ITP (idiopathic thrombocytopenia) ([place]) 05/25/2015   Coronary artery disease    Coronary artery disease involving native coronary artery of native heart with angina pectoris ([place])    Degenerative disc disease    GERD (gastroesophageal reflux disease)    History of thrombocytopenia    Hypercholesteremia    Hypertension    Hypothyroid    Lumbar pain    Myocardial infarction ([place]) 2009   Obesity    Pneumonia    around age 52   S/P CABG x 3 04/27/2018   LIMA to LAD SVG to OM1 SVG to OM2   Shortness of breath dyspnea     Past Surgical History:  Procedure Laterality Date   ANTERIOR CERVICAL DECOMP/DISCECTOMY FUSION N/A 07/19/2021   Procedure: CERVICAL THREE-FOUR ANTERIOR CERVICAL DECOMPRESSION/DISCECTOMY FUSION;  Surgeon: [name], MD;  Location: [facility];  Service: Neurosurgery;  Laterality: N/A;   ANTERIOR CERVICAL DECOMP/DISCECTOMY FUSION N/A 07/22/2021   Procedure: ANTERIOR CERVICAL DECOMPRESSION/DISCECTOMY REVISON FUSION  WITH EVACUATION  OF EPIDURAL HEMATOMA;  Surgeon: [name], DO;  Location: [facility];  Service: Neurosurgery;  Laterality: N/A;   BACK SURGERY     5 lumbas disc with cervical and lumbar fusions   BIOPSY N/A 03/06/2014   Procedure: ESOPHAGEAL BIOPSIES;  Surgeon: [name], MD;  [facility];  Service: Endoscopy;  Laterality: N/A;   COLONOSCOPY     COLONOSCOPY WITH PROPOFOL N/A 03/06/2014   Procedure: COLONOSCOPY WITH PROPOFOL;  Surgeon: [name], MD;  [facility];  Service: Endoscopy;  Laterality: N/A;  in cecum at 0807; total withdrawal time 9 minutes   CORONARY ANGIOPLASTY WITH STENT PLACEMENT     2000, and 2004 has 3 stents   CORONARY ARTERY BYPASS GRAFT N/A 04/27/2018   Procedure: CORONARY ARTERY BYPASS GRAFTING (CABG) x Three , using left internal mammary artery and right leg greater saphenous vein;  Surgeon: [name], MD;  Location: [facility];  Service: Open Heart Surgery;  Laterality: N/A;   ESOPHAGOGASTRODUODENOSCOPY (EGD) WITH PROPOFOL N/A 03/06/2014   Procedure: ESOPHAGOGASTRODUODENOSCOPY (EGD) WITH PROPOFOL;  Surgeon: [name], MD;  [facility];  Service: Endoscopy;  Laterality: N/A;   LEFT HEART CATH AND CORONARY ANGIOGRAPHY N/A 04/24/2018   Procedure: LEFT HEART CATH AND CORONARY ANGIOGRAPHY;  Surgeon: [name], MD;  Location: [facility];  Service: Cardiovascular;  Laterality: N/A;   LUMBAR FUSION  2009   MALONEY DILATION N/A 03/06/2014   Procedure: MALONEY DILATION 54 french;  Surgeon: [name], MD;  [facility];  Service: Endoscopy;  Laterality: N/A;   NECK SURGERY     TEE WITHOUT CARDIOVERSION N/A 04/27/2018   Procedure: TRANSESOPHAGEAL ECHOCARDIOGRAM (TEE);  Surgeon: [name], MD;  Location: [facility];  Service: Open Heart Surgery;  Laterality: N/A;    There were no vitals filed for this visit.   Subjective Assessment - 11/11/21 1427     Subjective No new reports, feeling pretty good today.    Pertinent History 71 y/o male  who underwent C3/4 ACDF on 07/19/21 then presented back to the ED on 07/20/21 with cervical stenosis with right greater than left weakness due to C3-4 epidural hematoma; cervical myelopathy. Pt underwent C3/4 revision and evacuation of hematoma, Placement of intervertebral biomechanical device C3-4, globus interbody, and Placement of anterior instrumentation consisting of interbody plate and screws    Patient Stated Goals Be able to walk instead of using w/c; 12/13: ability to get up from floor    Currently in Pain? No/denies                               [facility] Adult PT Treatment/Exercise - 11/11/21 0001       Knee/Hip Exercises: Aerobic   Nustep 5 minutes lv 3 for LE strengthening      Knee/Hip Exercises: Standing   Heel Raises 2 sets;10 reps    Heel Raises Limitations toe raises x 20    Knee Flexion Both;2 sets;10 reps    Knee Flexion Limitations 3lb    Hip Abduction Both;2 sets;10 reps    Abduction Limitations 3lb    Forward Step Up 2 sets;10 reps;Hand Hold: 2;Step Height: 4"    Gait Training 226 ft x 2 RT with one rest break, using RW and WC follow    Other Standing Knee Exercises standing march x20 each 3lb, 4 inch step taps x20      Knee/Hip Exercises: Seated   Sit to Sand 2 sets;10 reps;with UE support                       PT Short Term Goals - 11/09/21 1439       PT SHORT TERM GOAL #1   Title Patient will be able to complete 5x STS in under 15 seconds in order to reduce the risk of falls.    Baseline 20 sec with BUE support, increased time without UE support, 18" seat height    Time 3    Period Weeks    Status On-going    Target Date 11/23/21      PT SHORT TERM GOAL #2   Title Decrease risk for falls per TUG test time 12 sec with least restrictive AD    Baseline 16 sec with RW    Time 3    Period Weeks    Status On-going    Target Date 11/23/21      PT SHORT TERM GOAL #3   Title Patient will report at least 25% improvement in  symptoms for improved quality of life.    Time 3    Period Weeks    Status New    Target Date 10/20/21      PT SHORT TERM GOAL #4   Title Negotiate curb and  ascend/descend 12 stairs with supervision to improve safety with mobility    Baseline CGA with RW for curb, CGA with BHR stair ambulation x 8 stairs    Time 2    Period Weeks    Status New    Target Date 11/23/21               PT Long Term Goals - 11/09/21 1440       PT LONG TERM GOAL #1   Title Demo 4/5 BLE strength to improve safety and stability for standing ADL and gait    Baseline 4- to 4/5 gross BLE    Time 6    Period Weeks    Status Partially Met    Target Date 12/07/21      PT LONG TERM GOAL #2   Title Demo modified independent ambulation x 350 ft to improve functional ambulation    Baseline 226 ft with RW and SBA-CGA    Time 6    Period Weeks    Status On-going    Target Date 12/07/21      PT LONG TERM GOAL #3   Title Ascend/descend curb with least restrictive AD to improve safety with ambulation    Baseline CGA with RW    Time 6    Period Weeks    Status On-going    Target Date 12/07/21                   Plan - 11/11/21 1513     Clinical Impression Statement Patient progressing very well. Continued with established POC for LE strengthening and gait training. Added weighted hip abduction for LE strengthening. Able to progress walking distance to 2 RT in clinic with single rest break. Patient limited by ongoing LE weakness but is making significant improvements. Will continue to progress strength and balance for improved functional mobility and performance with ADLs.    Personal Factors and Comorbidities Age;Comorbidity 1;Time since onset of injury/illness/exacerbation    Comorbidities hx of multiple spine surgeries    Examination-Activity Limitations Bathing;Bend;Carry;Lift;Toileting;Stand;Stairs;Squat;Reach Overhead;Locomotion Level;Transfers    Examination-Participation Restrictions  Cleaning;Yard Work;Meal Prep;Driving;Community Activity    Stability/Clinical Decision Making Evolving/Moderate complexity    Rehab Potential Good    PT Frequency 2x / week    PT Duration 6 weeks    PT Treatment/Interventions ADLs/Self Care Home Management;Electrical Stimulation;DME Instruction;Gait training;Stair training;Functional mobility training;Therapeutic activities;Therapeutic exercise;Balance training;Patient/family education;Neuromuscular re-education;Wheelchair mobility training;Manual techniques;Dry needling    PT Next Visit Plan Body-weight support treadmill, progress gait, balance and functional hip strengthening.    PT Home Exercise Plan SLR; 12/13: bridge and clam; 12/15: chair push up; 12/20 sit to stand; supine hip abduction 12/22 band clamshell, laq    Consulted and Agree with Plan of Care Patient             Patient will benefit from skilled therapeutic intervention in order to improve the following deficits and impairments:  Abnormal gait, Decreased activity tolerance, Decreased balance, Decreased mobility, Decreased knowledge of use of DME, Decreased endurance, Decreased coordination, Decreased strength, Difficulty walking, Postural dysfunction, Improper body mechanics, Pain  Visit Diagnosis: Difficulty in walking, not elsewhere classified  Muscle weakness (generalized)  Unsteadiness on feet  Other abnormalities of gait and mobility     Problem List Patient Active Problem List   Diagnosis Date Noted   Cervical myelopathy ([place])    Hyponatremia    Neurogenic bladder    Pressure injury of skin 08/08/2021   Acute incomplete quadriplegia ([place]) 08/01/2021  Epidural hematoma 07/22/2021   Myelopathy ([place]) 07/19/2021   Callus 05/18/2021   Acute bacterial rhinosinusitis 10/05/2020   Cough in adult 10/05/2020   S/P CABG x 3 04/27/2018   Angina pectoris ([place]) 04/24/2018   Obesity    Chronic combined systolic and diastolic congestive heart failure ([place])     Peripheral edema 06/30/2017   Pedal edema 11/28/2016   Osteopenia 08/13/2015   Thrombocytopenia ([place]) 05/25/2015   Chronic ITP (idiopathic thrombocytopenia) ([place]) 05/25/2015   Hypothyroidism 08/29/2014   Diverticulitis of colon (without mention of hemorrhage)(562.11) 02/04/2014   Dysphagia, unspecified(787.20) 02/04/2014   Hyperlipidemia 11/18/2013   Dyspnea 01/02/2012   Palpitations 01/02/2012   Mixed hyperlipidemia 07/06/2011   Coronary artery disease involving native coronary artery of native heart with angina pectoris ([place])    TOTAL KNEE FOLLOW-UP 08/14/2008   Chronic back pain 07/23/2008   KNEE, ARTHRITIS, DEGEN./OSTEO 05/29/2008   JOINT EFFUSION, KNEE 04/30/2008   Pain in joint, lower leg 10/10/2007   Essential hypertension 10/09/2007   3:34 PM, 11/11/21 Josue Hector PT DPT  Physical Therapist with [facility]  [phone] [place], [place], [address] Phone: [phone]   Fax:  [phone]  Name: DEQUINCY BORN MRN: [id number] Date of Birth: 20-Feb-1951

## 2021-11-15 ENCOUNTER — Encounter: Payer: Medicare Other | Admitting: Physical Medicine and Rehabilitation

## 2021-11-16 ENCOUNTER — Ambulatory Visit (HOSPITAL_COMMUNITY): Payer: Medicare Other

## 2021-11-16 ENCOUNTER — Encounter (HOSPITAL_COMMUNITY): Payer: Self-pay | Admitting: Occupational Therapy

## 2021-11-16 ENCOUNTER — Ambulatory Visit (HOSPITAL_COMMUNITY): Payer: Medicare Other | Admitting: Occupational Therapy

## 2021-11-16 ENCOUNTER — Other Ambulatory Visit: Payer: Self-pay

## 2021-11-16 DIAGNOSIS — R2681 Unsteadiness on feet: Secondary | ICD-10-CM | POA: Diagnosis not present

## 2021-11-16 DIAGNOSIS — R278 Other lack of coordination: Secondary | ICD-10-CM

## 2021-11-16 DIAGNOSIS — R262 Difficulty in walking, not elsewhere classified: Secondary | ICD-10-CM | POA: Diagnosis not present

## 2021-11-16 DIAGNOSIS — R2689 Other abnormalities of gait and mobility: Secondary | ICD-10-CM | POA: Diagnosis not present

## 2021-11-16 DIAGNOSIS — R29818 Other symptoms and signs involving the nervous system: Secondary | ICD-10-CM

## 2021-11-16 DIAGNOSIS — M6281 Muscle weakness (generalized): Secondary | ICD-10-CM

## 2021-11-16 NOTE — Therapy (Signed)
[facility] [address] [place], [place], [address] Phone: [phone]   Fax:  [phone]  Physical Therapy Treatment  Patient Details  Name: [name] MRN: [id number] Date of Birth: 1951/09/17 Referring Provider (PT): [name], NP   Encounter Date: 11/16/2021   PT End of Session - 11/16/21 1429     Visit Number 10    Number of Visits 12    Date for PT Re-Evaluation 12/21/21    Authorization Type UHC Medicare    Progress Note Due on Visit 18    PT Start Time 1430    PT Stop Time 1515    PT Time Calculation (min) 45 min    Equipment Utilized During Treatment Gait belt    Activity Tolerance Patient tolerated treatment well    Behavior During Therapy WFL for tasks assessed/performed             Past Medical History:  Diagnosis Date   Allergy    Carpal tunnel syndrome    Chronic back pain    Chronic combined systolic and diastolic congestive heart failure (HCC)    Chronic ITP (idiopathic thrombocytopenia) ([place]) 05/25/2015   Coronary artery disease    Coronary artery disease involving native coronary artery of native heart with angina pectoris ([place])    Degenerative disc disease    GERD (gastroesophageal reflux disease)    History of thrombocytopenia    Hypercholesteremia    Hypertension    Hypothyroid    Lumbar pain    Myocardial infarction ([place]) 2009   Obesity    Pneumonia    around age 30   S/P CABG x 3 04/27/2018   LIMA to LAD SVG to OM1 SVG to OM2   Shortness of breath dyspnea     Past Surgical History:  Procedure Laterality Date   ANTERIOR CERVICAL DECOMP/DISCECTOMY FUSION N/A 07/19/2021   Procedure: CERVICAL THREE-FOUR ANTERIOR CERVICAL DECOMPRESSION/DISCECTOMY FUSION;  Surgeon: [name], MD;  Location: [facility];  Service: Neurosurgery;  Laterality: N/A;   ANTERIOR CERVICAL DECOMP/DISCECTOMY FUSION N/A 07/22/2021   Procedure: ANTERIOR CERVICAL DECOMPRESSION/DISCECTOMY REVISON FUSION  WITH EVACUATION  OF EPIDURAL HEMATOMA;  Surgeon: [name], DO;  Location: [facility];  Service: Neurosurgery;  Laterality: N/A;   BACK SURGERY     5 lumbas disc with cervical and lumbar fusions   BIOPSY N/A 03/06/2014   Procedure: ESOPHAGEAL BIOPSIES;  Surgeon: [name], MD;  [facility];  Service: Endoscopy;  Laterality: N/A;   COLONOSCOPY     COLONOSCOPY WITH PROPOFOL N/A 03/06/2014   Procedure: COLONOSCOPY WITH PROPOFOL;  Surgeon: [name], MD;  Location: [facility];  Service: Endoscopy;  Laterality: N/A;  in cecum at 0807; total withdrawal time 9 minutes   CORONARY ANGIOPLASTY WITH STENT PLACEMENT     2000, and 2004 has 3 stents   CORONARY ARTERY BYPASS GRAFT N/A 04/27/2018   Procedure: CORONARY ARTERY BYPASS GRAFTING (CABG) x Three , using left internal mammary artery and right leg greater saphenous vein;  Surgeon: [name], MD;  Location: [facility];  Service: Open Heart Surgery;  Laterality: N/A;   ESOPHAGOGASTRODUODENOSCOPY (EGD) WITH PROPOFOL N/A 03/06/2014   Procedure: ESOPHAGOGASTRODUODENOSCOPY (EGD) WITH PROPOFOL;  Surgeon: [name], MD;  [facility];  Service: Endoscopy;  Laterality: N/A;   LEFT HEART CATH AND CORONARY ANGIOGRAPHY N/A 04/24/2018   Procedure: LEFT HEART CATH AND CORONARY ANGIOGRAPHY;  Surgeon: [name], MD;  Location: [facility];  Service: Cardiovascular;  Laterality: N/A;   LUMBAR FUSION  2009   MALONEY DILATION N/A 03/06/2014   Procedure: MALONEY DILATION 54 french;  Surgeon: [name], MD;  [facility];  Service: Endoscopy;  Laterality: N/A;   NECK SURGERY     TEE WITHOUT CARDIOVERSION N/A 04/27/2018   Procedure: TRANSESOPHAGEAL ECHOCARDIOGRAM (TEE);  Surgeon: [name], MD;  Location: [facility];  Service: Open Heart Surgery;  Laterality: N/A;    There were no vitals filed for this visit.   Subjective Assessment - 11/16/21 1430     Subjective Able to walk into church this Sunday using walker    Pertinent History 71 y/o  male who underwent C3/4 ACDF on 07/19/21 then presented back to the ED on 07/20/21 with cervical stenosis with right greater than left weakness due to C3-4 epidural hematoma; cervical myelopathy. Pt underwent C3/4 revision and evacuation of hematoma, Placement of intervertebral biomechanical device C3-4, globus interbody, and Placement of anterior instrumentation consisting of interbody plate and screws    Patient Stated Goals Be able to walk instead of using w/c; 12/13: ability to get up from floor                               [facility] Adult PT Treatment/Exercise - 11/16/21 0001       Knee/Hip Exercises: Machines for Strengthening   Cybex Knee Flexion 6 plates 3x10 (rates 8/50 RPE)      Knee/Hip Exercises: Standing   Other Standing Knee Exercises resisted DF with red t-band 3x20 reps      Knee/Hip Exercises: Seated   Long Arc Quad Strengthening;Both;4 sets;15 reps    Long Arc Quad Weight 5 lbs.    Other Seated Knee/Hip Exercises seated on swiss ball alternating UE/LE lifts 3x10    Other Seated Knee/Hip Exercises seated on rocker board lateral performing LAQ for trunk stability 2x60 sec    Sit to Sand 3 sets;5 reps;without UE support   from swiss ball for momentum                      PT Short Term Goals - 11/09/21 1439       PT SHORT TERM GOAL #1   Title Patient will be able to complete 5x STS in under 15 seconds in order to reduce the risk of falls.    Baseline 20 sec with BUE support, increased time without UE support, 18" seat height    Time 3    Period Weeks    Status On-going    Target Date 11/23/21      PT SHORT TERM GOAL #2   Title Decrease risk for falls per TUG test time 12 sec with least restrictive AD    Baseline 16 sec with RW    Time 3    Period Weeks    Status On-going    Target Date 11/23/21      PT SHORT TERM GOAL #3   Title Patient will report at least 25% improvement in symptoms for improved quality of life.    Time 3     Period Weeks    Status New    Target Date 10/20/21      PT SHORT TERM GOAL #4   Title Negotiate curb and ascend/descend 12 stairs with supervision to improve safety with mobility    Baseline CGA with RW for curb, CGA with BHR stair ambulation x 8  stairs    Time 2    Period Weeks    Status New    Target Date 11/23/21               PT Long Term Goals - 11/09/21 1440       PT LONG TERM GOAL #1   Title Demo 4/5 BLE strength to improve safety and stability for standing ADL and gait    Baseline 4- to 4/5 gross BLE    Time 6    Period Weeks    Status Partially Met    Target Date 12/07/21      PT LONG TERM GOAL #2   Title Demo modified independent ambulation x 350 ft to improve functional ambulation    Baseline 226 ft with RW and SBA-CGA    Time 6    Period Weeks    Status On-going    Target Date 12/07/21      PT LONG TERM GOAL #3   Title Ascend/descend curb with least restrictive AD to improve safety with ambulation    Baseline CGA with RW    Time 6    Period Weeks    Status On-going    Target Date 12/07/21                   Plan - 11/16/21 1521     Clinical Impression Statement Continued with POC to improve mobility with today's emphasis on trunk stabilization whilst performing LE movements to enhance coordination and proximal control. Bilateral ankle weakness manifest during DF activity with right ankle lacking in eversion strength/balance with techniques to promote recruitment and guided practice. Continued sessions indicated to progress with core and BLE strength, coordination, facilitation to enable safe, unrestricted ambulation    Personal Factors and Comorbidities Age;Comorbidity 1;Time since onset of injury/illness/exacerbation    Comorbidities hx of multiple spine surgeries    Examination-Activity Limitations Bathing;Bend;Carry;Lift;Toileting;Stand;Stairs;Squat;Reach Overhead;Locomotion Level;Transfers    Examination-Participation Restrictions  Cleaning;Yard Work;Meal Prep;Driving;Community Activity    Stability/Clinical Decision Making Evolving/Moderate complexity    Rehab Potential Good    PT Frequency 2x / week    PT Duration 6 weeks    PT Treatment/Interventions ADLs/Self Care Home Management;Electrical Stimulation;DME Instruction;Gait training;Stair training;Functional mobility training;Therapeutic activities;Therapeutic exercise;Balance training;Patient/family education;Neuromuscular re-education;Wheelchair mobility training;Manual techniques;Dry needling    PT Next Visit Plan Body-weight support treadmill, progress gait, balance and functional hip strengthening.    PT Home Exercise Plan SLR; 12/13: bridge and clam; 12/15: chair push up; 12/20 sit to stand; supine hip abduction 12/22 band clamshell, laq    Consulted and Agree with Plan of Care Patient             Patient will benefit from skilled therapeutic intervention in order to improve the following deficits and impairments:  Abnormal gait, Decreased activity tolerance, Decreased balance, Decreased mobility, Decreased knowledge of use of DME, Decreased endurance, Decreased coordination, Decreased strength, Difficulty walking, Postural dysfunction, Improper body mechanics, Pain  Visit Diagnosis: Other symptoms and signs involving the nervous system  Difficulty in walking, not elsewhere classified  Muscle weakness (generalized)  Unsteadiness on feet     Problem List Patient Active Problem List   Diagnosis Date Noted   Cervical myelopathy ([place])    Hyponatremia    Neurogenic bladder    Pressure injury of skin 08/08/2021   Acute incomplete quadriplegia ([place]) 08/01/2021   Epidural hematoma 07/22/2021   Myelopathy ([place]) 07/19/2021   Callus 05/18/2021   Acute bacterial rhinosinusitis 10/05/2020   Cough in adult  10/05/2020   S/P CABG x 3 04/27/2018   Angina pectoris ([place]) 04/24/2018   Obesity    Chronic combined systolic and diastolic congestive heart failure  ([place])    Peripheral edema 06/30/2017   Pedal edema 11/28/2016   Osteopenia 08/13/2015   Thrombocytopenia ([place]) 05/25/2015   Chronic ITP (idiopathic thrombocytopenia) ([place]) 05/25/2015   Hypothyroidism 08/29/2014   Diverticulitis of colon (without mention of hemorrhage)(562.11) 02/04/2014   Dysphagia, unspecified(787.20) 02/04/2014   Hyperlipidemia 11/18/2013   Dyspnea 01/02/2012   Palpitations 01/02/2012   Mixed hyperlipidemia 07/06/2011   Coronary artery disease involving native coronary artery of native heart with angina pectoris ([place])    TOTAL KNEE FOLLOW-UP 08/14/2008   Chronic back pain 07/23/2008   KNEE, ARTHRITIS, DEGEN./OSTEO 05/29/2008   JOINT EFFUSION, KNEE 04/30/2008   Pain in joint, lower leg 10/10/2007   Essential hypertension 10/09/2007    Toniann Fail, PT 11/16/2021, 3:24 PM  [facility] [place], [place], [address] Phone: [phone]   Fax:  [phone]  Name: [name] MRN: [id number] Date of Birth: Sep 01, 1951

## 2021-11-16 NOTE — Therapy (Signed)
[facility] [address] [place], [place], [address] Phone: [phone]   Fax:  [phone]  Occupational Therapy Treatment  Patient Details  Name: [name] MRN: [id number] Date of Birth: 20-Apr-1951 Referring Provider (OT): Danella Sensing, NP   Encounter Date: 11/16/2021   OT End of Session - 11/16/21 1420     Visit Number 13    Number of Visits 20    Date for OT Re-Evaluation 12/11/21    Authorization Type UHC Medicare; $30 copay per discipline per visit    Authorization Time Period no visit limit    Progress Note Due on Visit 52    OT Start Time 1348    OT Stop Time 1426    OT Time Calculation (min) 38 min    Activity Tolerance Patient tolerated treatment well    Behavior During Therapy [facility] for tasks assessed/performed             Past Medical History:  Diagnosis Date   Allergy    Carpal tunnel syndrome    Chronic back pain    Chronic combined systolic and diastolic congestive heart failure (HCC)    Chronic ITP (idiopathic thrombocytopenia) ([place]) 05/25/2015   Coronary artery disease    Coronary artery disease involving native coronary artery of native heart with angina pectoris (HCC)    Degenerative disc disease    GERD (gastroesophageal reflux disease)    History of thrombocytopenia    Hypercholesteremia    Hypertension    Hypothyroid    Lumbar pain    Myocardial infarction ([place]) 2009   Obesity    Pneumonia    around age 65   S/P CABG x 3 04/27/2018   LIMA to LAD SVG to OM1 SVG to OM2   Shortness of breath dyspnea     Past Surgical History:  Procedure Laterality Date   ANTERIOR CERVICAL DECOMP/DISCECTOMY FUSION N/A 07/19/2021   Procedure: CERVICAL THREE-FOUR ANTERIOR CERVICAL DECOMPRESSION/DISCECTOMY FUSION;  Surgeon: [name], MD;  Location: [facility];  Service: Neurosurgery;  Laterality: N/A;   ANTERIOR CERVICAL DECOMP/DISCECTOMY FUSION N/A 07/22/2021   Procedure: ANTERIOR CERVICAL DECOMPRESSION/DISCECTOMY  REVISON FUSION  WITH EVACUATION OF EPIDURAL HEMATOMA;  Surgeon: [name], DO;  Location: [facility];  Service: Neurosurgery;  Laterality: N/A;   BACK SURGERY     5 lumbas disc with cervical and lumbar fusions   BIOPSY N/A 03/06/2014   Procedure: ESOPHAGEAL BIOPSIES;  Surgeon: [name], MD;  [facility];  Service: Endoscopy;  Laterality: N/A;   COLONOSCOPY     COLONOSCOPY WITH PROPOFOL N/A 03/06/2014   Procedure: COLONOSCOPY WITH PROPOFOL;  Surgeon: [name], MD;  [facility];  Service: Endoscopy;  Laterality: N/A;  in cecum at 0807; total withdrawal time 9 minutes   CORONARY ANGIOPLASTY WITH STENT PLACEMENT     2000, and 2004 has 3 stents   CORONARY ARTERY BYPASS GRAFT N/A 04/27/2018   Procedure: CORONARY ARTERY BYPASS GRAFTING (CABG) x Three , using left internal mammary artery and right leg greater saphenous vein;  Surgeon: [name], MD;  Location: [facility];  Service: Open Heart Surgery;  Laterality: N/A;   ESOPHAGOGASTRODUODENOSCOPY (EGD) WITH PROPOFOL N/A 03/06/2014   Procedure: ESOPHAGOGASTRODUODENOSCOPY (EGD) WITH PROPOFOL;  Surgeon: [name], MD;  [facility];  Service: Endoscopy;  Laterality: N/A;   LEFT HEART CATH AND CORONARY ANGIOGRAPHY N/A 04/24/2018   Procedure: LEFT HEART CATH AND CORONARY ANGIOGRAPHY;  Surgeon: [name], MD;  Location: [facility];  Service: Cardiovascular;  Laterality: N/A;   LUMBAR FUSION  2009   MALONEY DILATION N/A 03/06/2014   Procedure: MALONEY DILATION 54 french;  Surgeon: [name], MD;  [facility];  Service: Endoscopy;  Laterality: N/A;   NECK SURGERY     TEE WITHOUT CARDIOVERSION N/A 04/27/2018   Procedure: TRANSESOPHAGEAL ECHOCARDIOGRAM (TEE);  Surgeon: [name], MD;  Location: [facility];  Service: Open Heart Surgery;  Laterality: N/A;    There were no vitals filed for this visit.   Subjective Assessment - 11/16/21 1349     Subjective  S: I walked into church this weekend.     Currently in Pain? No/denies                          OT Treatments/Exercises (OP) - 11/16/21 1349       Exercises   Exercises Hand      Fine Motor Coordination (Hand/Wrist)   Fine Motor Coordination Manipulation of small objects    Manipulation of small objects Pt working on building a small table today, using nuts/bolts and screws, screwdriver and wrench. Max diffculty using right hand initially however improved with practice. Increased time for completion. Intermittent cuing for problem solving and to incorporate right hand.                    OT Education - 11/16/21 1419     Education Details pencil control packet for tracing    Person(s) Educated Patient    Methods Explanation;Demonstration    Comprehension Verbalized understanding;Returned demonstration              OT Short Term Goals - 11/11/21 1403       OT SHORT TERM GOAL #1   Title Pt will be provided with and educated on HEP to improve mobility of BUE required for ADL task completion.    Time 4    Period Weeks    Status Achieved    Target Date 10/14/21      OT SHORT TERM GOAL #2   Title Pt will demonstrate RUE A/ROM WFL to improve ability to reach overhead and behind back during dressing tasks.    Time 4    Period Weeks    Status Partially Met      OT SHORT TERM GOAL #3   Title Pt will demonstrate improved fine motor coordination required for self-feeding by completing 9 hole peg test in under 1' 15"  for the left hand and in under 6' for the right hand.    Baseline 12/13: right hand 1'46", left hand 1' 25"  1/12: right hand 1'34"; left hand 53.58"    Time 4    Period Weeks    Status Achieved      OT SHORT TERM GOAL #4   Title Pt will demonstrate improved grip strength of at least 35# required for holding items with right hand.    Time 4    Period Weeks               OT Long Term Goals - 11/11/21 1405       OT LONG TERM GOAL #1   Title Pt will increase RUE  strength to 4-/5 or greater to improve ability to use RUE as dominant during simple ADLs.    Time 8    Period Weeks    Status Revised      OT LONG TERM  GOAL #2   Title Pt will increase right grip strength to 40# or greater and pinch strength to 12# or greater to improve ability to complete functional tasks requiring sustained grip on objects.    Time 8    Period Weeks      OT LONG TERM GOAL #3   Title Pt will increase fine motor coordination required for operating buttons or tying shoes by completing 9 hole peg test in under 50' with left hand and under 1'10" with right hand.    Baseline 12/13: right hand 1' 49" , left hand 1' 25" ; 1/12: right hand 1'34" left hand 53.58"    Time 8    Period Weeks    Status Revised      OT LONG TERM GOAL #4   Title Pt will be educated on AE available to faciliate independence in ADLs.    Time 8    Period Weeks    Status On-going                   Plan - 11/16/21 1417     Clinical Impression Statement A: Pt reports difficulty with signing his name on some papers recently as he cannot write. Session focusing on fine motor activity of building a table. Increased time required for completion. Provided tracing practice for home to work on Editor, commissioning.    Body Structure / Function / Physical Skills ADL;Endurance;UE functional use;Pain;ROM;IADL;Strength;Mobility;Coordination    Plan P: Follow up on graphomotor tracing practice. Finish table building activity. Continue with coordination activities and work on signing name    [place] 11/15: red theraputty exercises; 11/17: supine A/ROM; 11/21: hand A/ROM; 12/13: seated AA/ROM; 1/17: pencil control packet for tracing    Consulted and Agree with Plan of Care Patient             Patient will benefit from skilled therapeutic intervention in order to improve the following deficits and impairments:   Body Structure / Function / Physical Skills: ADL, Endurance, UE functional use,  Pain, ROM, IADL, Strength, Mobility, Coordination       Visit Diagnosis: Other lack of coordination  Other symptoms and signs involving the nervous system    Problem List Patient Active Problem List   Diagnosis Date Noted   Cervical myelopathy ([place])    Hyponatremia    Neurogenic bladder    Pressure injury of skin 08/08/2021   Acute incomplete quadriplegia ([place]) 08/01/2021   Epidural hematoma 07/22/2021   Myelopathy ([place]) 07/19/2021   Callus 05/18/2021   Acute bacterial rhinosinusitis 10/05/2020   Cough in adult 10/05/2020   S/P CABG x 3 04/27/2018   Angina pectoris ([place]) 04/24/2018   Obesity    Chronic combined systolic and diastolic congestive heart failure ([place])    Peripheral edema 06/30/2017   Pedal edema 11/28/2016   Osteopenia 08/13/2015   Thrombocytopenia ([place]) 05/25/2015   Chronic ITP (idiopathic thrombocytopenia) ([place]) 05/25/2015   Hypothyroidism 08/29/2014   Diverticulitis of colon (without mention of hemorrhage)(562.11) 02/04/2014   Dysphagia, unspecified(787.20) 02/04/2014   Hyperlipidemia 11/18/2013   Dyspnea 01/02/2012   Palpitations 01/02/2012   Mixed hyperlipidemia 07/06/2011   Coronary artery disease involving native coronary artery of native heart with angina pectoris ([place])    TOTAL KNEE FOLLOW-UP 08/14/2008   Chronic back pain 07/23/2008   KNEE, ARTHRITIS, DEGEN./OSTEO 05/29/2008   JOINT EFFUSION, KNEE 04/30/2008   Pain in joint, lower leg 10/10/2007   Essential hypertension 10/09/2007    Guadelupe Sabin, OTR/L  [phone]  11/16/2021, 2:27 PM  [facility] [place], [place], [address] Phone: [phone]   Fax:  [phone]  Name: PRANEETH BUSSEY MRN: [id number] Date of Birth: [birth year]

## 2021-11-18 ENCOUNTER — Encounter (HOSPITAL_COMMUNITY): Payer: Self-pay | Admitting: Occupational Therapy

## 2021-11-18 ENCOUNTER — Other Ambulatory Visit: Payer: Self-pay

## 2021-11-18 ENCOUNTER — Ambulatory Visit (HOSPITAL_COMMUNITY): Payer: Medicare Other | Admitting: Physical Therapy

## 2021-11-18 ENCOUNTER — Ambulatory Visit (HOSPITAL_COMMUNITY): Payer: Medicare Other | Admitting: Occupational Therapy

## 2021-11-18 ENCOUNTER — Encounter (HOSPITAL_COMMUNITY): Payer: Self-pay | Admitting: Physical Therapy

## 2021-11-18 DIAGNOSIS — R278 Other lack of coordination: Secondary | ICD-10-CM | POA: Diagnosis not present

## 2021-11-18 DIAGNOSIS — R262 Difficulty in walking, not elsewhere classified: Secondary | ICD-10-CM

## 2021-11-18 DIAGNOSIS — R29818 Other symptoms and signs involving the nervous system: Secondary | ICD-10-CM

## 2021-11-18 DIAGNOSIS — R2681 Unsteadiness on feet: Secondary | ICD-10-CM | POA: Diagnosis not present

## 2021-11-18 DIAGNOSIS — R2689 Other abnormalities of gait and mobility: Secondary | ICD-10-CM | POA: Diagnosis not present

## 2021-11-18 DIAGNOSIS — M6281 Muscle weakness (generalized): Secondary | ICD-10-CM | POA: Diagnosis not present

## 2021-11-18 NOTE — Therapy (Signed)
[facility] [address] [place], [place], [address] Phone: [phone]   Fax:  [phone]  Occupational Therapy Treatment  Patient Details  Name: [name] MRN: [id number] Date of Birth: 05-03-1951 Referring Provider (Burns): Danella Sensing, NP   Encounter Date: 11/18/2021   Burns End of Session - 11/18/21 1425     Visit Number 14    Number of Visits 20    Date for Burns Re-Evaluation 12/11/21    Authorization Type UHC Medicare; $30 copay per discipline per visit    Authorization Time Period no visit limit    Progress Note Due on Visit 50    Burns Start Time 1345    Burns Stop Time 1427    Burns Time Calculation (min) 42 min    Activity Tolerance Patient tolerated treatment well    Behavior During Therapy [facility] for tasks assessed/performed             Past Medical History:  Diagnosis Date   Allergy    Carpal tunnel syndrome    Chronic back pain    Chronic combined systolic and diastolic congestive heart failure (HCC)    Chronic ITP (idiopathic thrombocytopenia) ([place]) 05/25/2015   Coronary artery disease    Coronary artery disease involving native coronary artery of native heart with angina pectoris (HCC)    Degenerative disc disease    GERD (gastroesophageal reflux disease)    History of thrombocytopenia    Hypercholesteremia    Hypertension    Hypothyroid    Lumbar pain    Myocardial infarction ([place]) 2009   Obesity    Pneumonia    around age 28   S/P CABG x 3 04/27/2018   LIMA to LAD SVG to OM1 SVG to OM2   Shortness of breath dyspnea     Past Surgical History:  Procedure Laterality Date   ANTERIOR CERVICAL DECOMP/DISCECTOMY FUSION N/A 07/19/2021   Procedure: CERVICAL THREE-FOUR ANTERIOR CERVICAL DECOMPRESSION/DISCECTOMY FUSION;  Surgeon: [name], MD;  Location: [place];  Service: Neurosurgery;  Laterality: N/A;   ANTERIOR CERVICAL DECOMP/DISCECTOMY FUSION N/A 07/22/2021   Procedure: ANTERIOR CERVICAL DECOMPRESSION/DISCECTOMY  REVISON FUSION  WITH EVACUATION OF EPIDURAL HEMATOMA;  Surgeon: [name], DO;  Location: [facility];  Service: Neurosurgery;  Laterality: N/A;   BACK SURGERY     5 lumbas disc with cervical and lumbar fusions   BIOPSY N/A 03/06/2014   Procedure: ESOPHAGEAL BIOPSIES;  Surgeon: [name], MD;  [facility];  Service: Endoscopy;  Laterality: N/A;   COLONOSCOPY     COLONOSCOPY WITH PROPOFOL N/A 03/06/2014   Procedure: COLONOSCOPY WITH PROPOFOL;  Surgeon: [name], MD;  [facility];  Service: Endoscopy;  Laterality: N/A;  in cecum at 0807; total withdrawal time 9 minutes   CORONARY ANGIOPLASTY WITH STENT PLACEMENT     2000, and 2004 has 3 stents   CORONARY ARTERY BYPASS GRAFT N/A 04/27/2018   Procedure: CORONARY ARTERY BYPASS GRAFTING (CABG) x Three , using left internal mammary artery and right leg greater saphenous vein;  Surgeon: [name], MD;  Location: [facility];  Service: Open Heart Surgery;  Laterality: N/A;   ESOPHAGOGASTRODUODENOSCOPY (EGD) WITH PROPOFOL N/A 03/06/2014   Procedure: ESOPHAGOGASTRODUODENOSCOPY (EGD) WITH PROPOFOL;  Surgeon: [name], MD;  [facility];  Service: Endoscopy;  Laterality: N/A;   LEFT HEART CATH AND CORONARY ANGIOGRAPHY N/A 04/24/2018   Procedure: LEFT HEART CATH AND CORONARY ANGIOGRAPHY;  Surgeon: [name], MD;  Location: [facility];  Service: Cardiovascular;  Laterality: N/A;   LUMBAR FUSION  2009   MALONEY DILATION N/A 03/06/2014   Procedure: MALONEY DILATION 54 french;  Surgeon: [name], MD;  [facility];  Service: Endoscopy;  Laterality: N/A;   NECK SURGERY     TEE WITHOUT CARDIOVERSION N/A 04/27/2018   Procedure: TRANSESOPHAGEAL ECHOCARDIOGRAM (TEE);  Surgeon: [name], MD;  Location: [facility];  Service: Open Heart Surgery;  Laterality: N/A;    There were no vitals filed for this visit.   Subjective Assessment - 11/18/21 1344     Subjective  S: I just have minor aches today, weather  related.    Currently in Pain? No/denies                [name] Assessment - 11/18/21 1344       Assessment   Medical Diagnosis Acute incomplete quadriplegia      Precautions   Precautions Cervical;Fall    Required Braces or Orthoses Cervical Brace    Cervical Brace Soft collar;For comfort                      Burns Treatments/Exercises (OP) - 11/18/21 1359       Fine Motor Coordination (Hand/Wrist)   Fine Motor Coordination Manipulation of small objects    Manipulation of small objects Pt finished building a small table today, using nuts/bolts and screws, screwdriver and wrench. Min/mod difficulty utilizing tools-wrench and screwdriver-to tighten nuts and bolts. Increased time for completion. Pt's motor planning improving and becoming smoother during task. Pt working on bilateral coordination and fine motor skills.                      Burns Short Term Goals - 11/11/21 1403       Burns SHORT TERM GOAL #1   Title Pt will be provided with and educated on HEP to improve mobility of BUE required for ADL task completion.    Time 4    Period Weeks    Status Achieved    Target Date 10/14/21      Burns SHORT TERM GOAL #2   Title Pt will demonstrate RUE A/ROM WFL to improve ability to reach overhead and behind back during dressing tasks.    Time 4    Period Weeks    Status Partially Met      Burns SHORT TERM GOAL #3   Title Pt will demonstrate improved fine motor coordination required for self-feeding by completing 9 hole peg test in under 1' 15"  for the left hand and in under 6' for the right hand.    Baseline 12/13: right hand 1'46", left hand 1' 25"  1/12: right hand 1'34"; left hand 53.58"    Time 4    Period Weeks    Status Achieved      Burns SHORT TERM GOAL #4   Title Pt will demonstrate improved grip strength of at least 35# required for holding items with right hand.    Time 4    Period Weeks               Burns Long Term Goals - 11/11/21 1405        Burns LONG TERM GOAL #1   Title Pt will increase RUE strength to 4-/5 or greater to improve ability to use RUE as dominant during simple ADLs.    Time 8    Period Weeks  Status Revised      Burns LONG TERM GOAL #2   Title Pt will increase right grip strength to 40# or greater and pinch strength to 12# or greater to improve ability to complete functional tasks requiring sustained grip on objects.    Time 8    Period Weeks      Burns LONG TERM GOAL #3   Title Pt will increase fine motor coordination required for operating buttons or tying shoes by completing 9 hole peg test in under 50' with left hand and under 1'10" with right hand.    Baseline 12/13: right hand 1' 49" , left hand 1' 25" ; 1/12: right hand 1'34" left hand 53.58"    Time 8    Period Weeks    Status Revised      Burns LONG TERM GOAL #4   Title Pt will be educated on AE available to faciliate independence in ADLs.    Time 8    Period Weeks    Status On-going                   Plan - 11/18/21 1404     Clinical Impression Statement A: Pt reports he working on his tracing practice, he went off the lines a little sometimes. Pt continuing with table building activity, tightening nuts and bolts and adding crossbars for table legs. Pt using tools for finishing table, using right hand as dominant and compensation for sensation deficits with vision. Pt writing name today, 25-50% legibility.    Body Structure / Function / Physical Skills ADL;Endurance;UE functional use;Pain;ROM;IADL;Strength;Mobility;Coordination    Plan P:  Continue with coordination activities and work, tracing or name writing activity-trial larger pen or built up pen    Burns Home Exercise Plan 11/15: red theraputty exercises; 11/17: supine A/ROM; 11/21: hand A/ROM; 12/13: seated AA/ROM; 1/17: pencil control packet for tracing    Consulted and Agree with Plan of Care Patient             Patient will benefit from skilled therapeutic intervention in  order to improve the following deficits and impairments:   Body Structure / Function / Physical Skills: ADL, Endurance, UE functional use, Pain, ROM, IADL, Strength, Mobility, Coordination       Visit Diagnosis: Other lack of coordination  Other symptoms and signs involving the nervous system    Problem List Patient Active Problem List   Diagnosis Date Noted   Cervical myelopathy ([place])    Hyponatremia    Neurogenic bladder    Pressure injury of skin 08/08/2021   Acute incomplete quadriplegia ([place]) 08/01/2021   Epidural hematoma 07/22/2021   Myelopathy ([place]) 07/19/2021   Callus 05/18/2021   Acute bacterial rhinosinusitis 10/05/2020   Cough in adult 10/05/2020   S/P CABG x 3 04/27/2018   Angina pectoris ([place]) 04/24/2018   Obesity    Chronic combined systolic and diastolic congestive heart failure ([place])    Peripheral edema 06/30/2017   Pedal edema 11/28/2016   Osteopenia 08/13/2015   Thrombocytopenia ([place]) 05/25/2015   Chronic ITP (idiopathic thrombocytopenia) ([place]) 05/25/2015   Hypothyroidism 08/29/2014   Diverticulitis of colon (without mention of hemorrhage)(562.11) 02/04/2014   Dysphagia, unspecified(787.20) 02/04/2014   Hyperlipidemia 11/18/2013   Dyspnea 01/02/2012   Palpitations 01/02/2012   Mixed hyperlipidemia 07/06/2011   Coronary artery disease involving native coronary artery of native heart with angina pectoris ([place])    TOTAL KNEE FOLLOW-UP 08/14/2008   Chronic back pain 07/23/2008   KNEE, ARTHRITIS, DEGEN./OSTEO 05/29/2008  JOINT EFFUSION, KNEE 04/30/2008   Pain in joint, lower leg 10/10/2007   Essential hypertension 10/09/2007    Guadelupe Sabin, OTR/L  [phone] 11/18/2021, 2:28 PM  [facility], [place], [address] Phone: [phone]   Fax:  [phone]  Name: [name] MRN: [id number] Date of Birth: 1951/07/22

## 2021-11-18 NOTE — Therapy (Signed)
[facility] [address] [place], [place], [address] Phone: [phone]   Fax:  [phone]  Physical Therapy Treatment  Patient Details  Name: [name] MRN: [id number] Date of Birth: [birth year] Referring Provider (PT): [name], NP   Encounter Date: 11/18/2021   PT End of Session - 11/18/21 1432     Visit Number 11    Number of Visits 12    Date for PT Re-Evaluation 12/21/21    Authorization Type UHC Medicare    Progress Note Due on Visit 18    PT Start Time 1430    PT Stop Time 1513    PT Time Calculation (min) 43 min    Equipment Utilized During Treatment Gait belt    Activity Tolerance Patient tolerated treatment well    Behavior During Therapy WFL for tasks assessed/performed             Past Medical History:  Diagnosis Date   Allergy    Carpal tunnel syndrome    Chronic back pain    Chronic combined systolic and diastolic congestive heart failure (HCC)    Chronic ITP (idiopathic thrombocytopenia) ([place]) 05/25/2015   Coronary artery disease    Coronary artery disease involving native coronary artery of native heart with angina pectoris ([place])    Degenerative disc disease    GERD (gastroesophageal reflux disease)    History of thrombocytopenia    Hypercholesteremia    Hypertension    Hypothyroid    Lumbar pain    Myocardial infarction ([place]) 2009   Obesity    Pneumonia    around age 35   S/P CABG x 3 04/27/2018   LIMA to LAD SVG to OM1 SVG to OM2   Shortness of breath dyspnea     Past Surgical History:  Procedure Laterality Date   ANTERIOR CERVICAL DECOMP/DISCECTOMY FUSION N/A 07/19/2021   Procedure: CERVICAL THREE-FOUR ANTERIOR CERVICAL DECOMPRESSION/DISCECTOMY FUSION;  Surgeon: [name], MD;  Location: [facility];  Service: Neurosurgery;  Laterality: N/A;   ANTERIOR CERVICAL DECOMP/DISCECTOMY FUSION N/A 07/22/2021   Procedure: ANTERIOR CERVICAL DECOMPRESSION/DISCECTOMY REVISON FUSION  WITH EVACUATION  OF EPIDURAL HEMATOMA;  Surgeon: [name], DO;  Location: [facility];  Service: Neurosurgery;  Laterality: N/A;   BACK SURGERY     5 lumbas disc with cervical and lumbar fusions   BIOPSY N/A 03/06/2014   Procedure: ESOPHAGEAL BIOPSIES;  Surgeon: [name], MD;  [facility];  Service: Endoscopy;  Laterality: N/A;   COLONOSCOPY     COLONOSCOPY WITH PROPOFOL N/A 03/06/2014   Procedure: COLONOSCOPY WITH PROPOFOL;  Surgeon: [name], MD;  [facility]: [facility];  Service: Endoscopy;  Laterality: N/A;  in cecum at 0807; total withdrawal time 9 minutes   CORONARY ANGIOPLASTY WITH STENT PLACEMENT     2000, and 2004 has 3 stents   CORONARY ARTERY BYPASS GRAFT N/A 04/27/2018   Procedure: CORONARY ARTERY BYPASS GRAFTING (CABG) x Three , using left internal mammary artery and right leg greater saphenous vein;  Surgeon: [name], MD;  Location: [facility];  Service: Open Heart Surgery;  Laterality: N/A;   ESOPHAGOGASTRODUODENOSCOPY (EGD) WITH PROPOFOL N/A 03/06/2014   Procedure: ESOPHAGOGASTRODUODENOSCOPY (EGD) WITH PROPOFOL;  Surgeon: [name], MD;  [facility];  Service: Endoscopy;  Laterality: N/A;   LEFT HEART CATH AND CORONARY ANGIOGRAPHY N/A 04/24/2018   Procedure: LEFT HEART CATH AND CORONARY ANGIOGRAPHY;  Surgeon: [name], MD;  Location: [facility];  Service: Cardiovascular;  Laterality: N/A;   LUMBAR FUSION  2009   MALONEY DILATION N/A 03/06/2014   Procedure: MALONEY DILATION 54 french;  Surgeon: [name], MD;  [facility];  Service: Endoscopy;  Laterality: N/A;   NECK SURGERY     TEE WITHOUT CARDIOVERSION N/A 04/27/2018   Procedure: TRANSESOPHAGEAL ECHOCARDIOGRAM (TEE);  Surgeon: [name], MD;  Location: [facility];  Service: Open Heart Surgery;  Laterality: N/A;    There were no vitals filed for this visit.   Subjective Assessment - 11/18/21 1432     Subjective Doing good, no new reports.    Pertinent History 71 y/o male who underwent  C3/4 ACDF on 07/19/21 then presented back to the ED on 07/20/21 with cervical stenosis with right greater than left weakness due to C3-4 epidural hematoma; cervical myelopathy. Pt underwent C3/4 revision and evacuation of hematoma, Placement of intervertebral biomechanical device C3-4, globus interbody, and Placement of anterior instrumentation consisting of interbody plate and screws    Patient Stated Goals Be able to walk instead of using w/c; 12/13: ability to get up from floor    Currently in Pain? No/denies                               [facility] Adult PT Treatment/Exercise - 11/18/21 0001       Knee/Hip Exercises: Aerobic   Nustep 5 minutes lv 2 for LE strengthening      Knee/Hip Exercises: Standing   Knee Flexion 3 sets;10 reps    Knee Flexion Limitations 5lb    Hip Abduction 3 sets;10 reps    Abduction Limitations 5lb    Forward Step Up 2 sets;10 reps;Hand Hold: 2;Step Height: 6"    Gait Training 455 ft using RW and WC follow    Other Standing Knee Exercises marching 5lb 3 x 10      Knee/Hip Exercises: Seated   Long Arc Quad --    Long Arc [employer] --    Other Seated Knee/Hip Exercises ankle DF 5lb weights 3 x 10                       PT Short Term Goals - 11/09/21 1439       PT SHORT TERM GOAL #1   Title Patient will be able to complete 5x STS in under 15 seconds in order to reduce the risk of falls.    Baseline 20 sec with BUE support, increased time without UE support, 18" seat height    Time 3    Period Weeks    Status On-going    Target Date 11/23/21      PT SHORT TERM GOAL #2   Title Decrease risk for falls per TUG test time 12 sec with least restrictive AD    Baseline 16 sec with RW    Time 3    Period Weeks    Status On-going    Target Date 11/23/21      PT SHORT TERM GOAL #3   Title Patient will report at least 25% improvement in symptoms for improved quality of life.    Time 3    Period Weeks    Status New    Target  Date 10/20/21      PT SHORT TERM GOAL #4   Title Negotiate curb and ascend/descend 12 stairs with supervision to improve safety with mobility    Baseline  CGA with RW for curb, CGA with BHR stair ambulation x 8 stairs    Time 2    Period Weeks    Status New    Target Date 11/23/21               PT Long Term Goals - 11/09/21 1440       PT LONG TERM GOAL #1   Title Demo 4/5 BLE strength to improve safety and stability for standing ADL and gait    Baseline 4- to 4/5 gross BLE    Time 6    Period Weeks    Status Partially Met    Target Date 12/07/21      PT LONG TERM GOAL #2   Title Demo modified independent ambulation x 350 ft to improve functional ambulation    Baseline 226 ft with RW and SBA-CGA    Time 6    Period Weeks    Status On-going    Target Date 12/07/21      PT LONG TERM GOAL #3   Title Ascend/descend curb with least restrictive AD to improve safety with ambulation    Baseline CGA with RW    Time 6    Period Weeks    Status On-going    Target Date 12/07/21                   Plan - 11/18/21 1522     Clinical Impression Statement Patient tolerated session well today. Progressed LE strengthening with increased weights during standing hip abduction/ flexion/ knee flexion. Patient showing significant improvement in distance ambulated during gait training. Required verbal cues for improved upright posture and maintained approximation to RW. Patient noting increased fatigue with increased resistance throughout session. Will continue to progress LE strength and balance activity for continued improvement in functional mobility.    Personal Factors and Comorbidities Age;Comorbidity 1;Time since onset of injury/illness/exacerbation    Comorbidities hx of multiple spine surgeries    Examination-Activity Limitations Bathing;Bend;Carry;Lift;Toileting;Stand;Stairs;Squat;Reach Overhead;Locomotion Level;Transfers    Examination-Participation Restrictions  Cleaning;Yard Work;Meal Prep;Driving;Community Activity    Stability/Clinical Decision Making Evolving/Moderate complexity    Rehab Potential Good    PT Frequency 2x / week    PT Duration 6 weeks    PT Treatment/Interventions ADLs/Self Care Home Management;Electrical Stimulation;DME Instruction;Gait training;Stair training;Functional mobility training;Therapeutic activities;Therapeutic exercise;Balance training;Patient/family education;Neuromuscular re-education;Wheelchair mobility training;Manual techniques;Dry needling    PT Next Visit Plan Reassess. Body-weight support treadmill, progress gait, balance and functional hip strengthening.    PT Home Exercise Plan SLR; 12/13: bridge and clam; 12/15: chair push up; 12/20 sit to stand; supine hip abduction 12/22 band clamshell, laq    Consulted and Agree with Plan of Care Patient             Patient will benefit from skilled therapeutic intervention in order to improve the following deficits and impairments:  Abnormal gait, Decreased activity tolerance, Decreased balance, Decreased mobility, Decreased knowledge of use of DME, Decreased endurance, Decreased coordination, Decreased strength, Difficulty walking, Postural dysfunction, Improper body mechanics, Pain  Visit Diagnosis: Other symptoms and signs involving the nervous system  Difficulty in walking, not elsewhere classified  Muscle weakness (generalized)  Unsteadiness on feet     Problem List Patient Active Problem List   Diagnosis Date Noted   Cervical myelopathy ([place])    Hyponatremia    Neurogenic bladder    Pressure injury of skin 08/08/2021   Acute incomplete quadriplegia ([place]) 08/01/2021   Epidural hematoma 07/22/2021   Myelopathy ([place])  07/19/2021   Callus 05/18/2021   Acute bacterial rhinosinusitis 10/05/2020   Cough in adult 10/05/2020   S/P CABG x 3 04/27/2018   Angina pectoris ([place]) 04/24/2018   Obesity    Chronic combined systolic and diastolic congestive  heart failure ([place])    Peripheral edema 06/30/2017   Pedal edema 11/28/2016   Osteopenia 08/13/2015   Thrombocytopenia ([place]) 05/25/2015   Chronic ITP (idiopathic thrombocytopenia) ([place]) 05/25/2015   Hypothyroidism 08/29/2014   Diverticulitis of colon (without mention of hemorrhage)(562.11) 02/04/2014   Dysphagia, unspecified(787.20) 02/04/2014   Hyperlipidemia 11/18/2013   Dyspnea 01/02/2012   Palpitations 01/02/2012   Mixed hyperlipidemia 07/06/2011   Coronary artery disease involving native coronary artery of native heart with angina pectoris ([place])    TOTAL KNEE FOLLOW-UP 08/14/2008   Chronic back pain 07/23/2008   KNEE, ARTHRITIS, DEGEN./OSTEO 05/29/2008   JOINT EFFUSION, KNEE 04/30/2008   Pain in joint, lower leg 10/10/2007   Essential hypertension 10/09/2007   3:25 PM, 11/18/21 [name]  Physical Therapist with [facility]  [phone] [place], [place], [address] Phone: [phone]   Fax:  [phone]  Name: [name] MRN: [id number] Date of Birth: 1951/06/19

## 2021-11-23 ENCOUNTER — Ambulatory Visit (HOSPITAL_COMMUNITY): Payer: Medicare Other

## 2021-11-23 ENCOUNTER — Ambulatory Visit (HOSPITAL_COMMUNITY): Payer: Medicare Other | Admitting: Occupational Therapy

## 2021-11-23 ENCOUNTER — Encounter (HOSPITAL_COMMUNITY): Payer: Self-pay | Admitting: Occupational Therapy

## 2021-11-23 ENCOUNTER — Other Ambulatory Visit: Payer: Self-pay

## 2021-11-23 DIAGNOSIS — R2681 Unsteadiness on feet: Secondary | ICD-10-CM

## 2021-11-23 DIAGNOSIS — R262 Difficulty in walking, not elsewhere classified: Secondary | ICD-10-CM | POA: Diagnosis not present

## 2021-11-23 DIAGNOSIS — R29818 Other symptoms and signs involving the nervous system: Secondary | ICD-10-CM | POA: Diagnosis not present

## 2021-11-23 DIAGNOSIS — R2689 Other abnormalities of gait and mobility: Secondary | ICD-10-CM | POA: Diagnosis not present

## 2021-11-23 DIAGNOSIS — M6281 Muscle weakness (generalized): Secondary | ICD-10-CM | POA: Diagnosis not present

## 2021-11-23 DIAGNOSIS — R278 Other lack of coordination: Secondary | ICD-10-CM

## 2021-11-23 NOTE — Therapy (Signed)
[facility] [address] [place], [place], [address] Phone: [phone]   Fax:  [phone]  Physical Therapy Treatment and Recertification  Patient Details  Name: [name] MRN: [id number] Date of Birth: 09-Jan-1951 Referring Provider (PT): [name], NP   Encounter Date: 11/23/2021   PT End of Session - 11/23/21 1439     Visit Number 12    Number of Visits 24    Date for PT Re-Evaluation 01/04/22    Authorization Type UHC Medicare    Progress Note Due on Visit 18    PT Start Time 1430    PT Stop Time 1515    PT Time Calculation (min) 45 min    Equipment Utilized During Treatment Gait belt    Activity Tolerance Patient tolerated treatment well    Behavior During Therapy WFL for tasks assessed/performed             Past Medical History:  Diagnosis Date   Allergy    Carpal tunnel syndrome    Chronic back pain    Chronic combined systolic and diastolic congestive heart failure (HCC)    Chronic ITP (idiopathic thrombocytopenia) ([place]) 05/25/2015   Coronary artery disease    Coronary artery disease involving native coronary artery of native heart with angina pectoris ([place])    Degenerative disc disease    GERD (gastroesophageal reflux disease)    History of thrombocytopenia    Hypercholesteremia    Hypertension    Hypothyroid    Lumbar pain    Myocardial infarction ([place]) 2009   Obesity    Pneumonia    around age 52   S/P CABG x 3 04/27/2018   LIMA to LAD SVG to OM1 SVG to OM2   Shortness of breath dyspnea     Past Surgical History:  Procedure Laterality Date   ANTERIOR CERVICAL DECOMP/DISCECTOMY FUSION N/A 07/19/2021   Procedure: CERVICAL THREE-FOUR ANTERIOR CERVICAL DECOMPRESSION/DISCECTOMY FUSION;  Surgeon: [name], MD;  Location: [facility];  Service: Neurosurgery;  Laterality: N/A;   ANTERIOR CERVICAL DECOMP/DISCECTOMY FUSION N/A 07/22/2021   Procedure: ANTERIOR CERVICAL DECOMPRESSION/DISCECTOMY REVISON  FUSION  WITH EVACUATION OF EPIDURAL HEMATOMA;  Surgeon: [name], DO;  Location: [facility];  Service: Neurosurgery;  Laterality: N/A;   BACK SURGERY     5 lumbas disc with cervical and lumbar fusions   BIOPSY N/A 03/06/2014   Procedure: ESOPHAGEAL BIOPSIES;  Surgeon: [name], MD;  Location: [facility];  Service: Endoscopy;  Laterality: N/A;   COLONOSCOPY     COLONOSCOPY WITH PROPOFOL N/A 03/06/2014   Procedure: COLONOSCOPY WITH PROPOFOL;  Surgeon: [name], MD;  Location: [facility];  Service: Endoscopy;  Laterality: N/A;  in cecum at 0807; total withdrawal time 9 minutes   CORONARY ANGIOPLASTY WITH STENT PLACEMENT     2000, and 2004 has 3 stents   CORONARY ARTERY BYPASS GRAFT N/A 04/27/2018   Procedure: CORONARY ARTERY BYPASS GRAFTING (CABG) x Three , using left internal mammary artery and right leg greater saphenous vein;  Surgeon: [name], MD;  Location: [facility];  Service: Open Heart Surgery;  Laterality: N/A;   ESOPHAGOGASTRODUODENOSCOPY (EGD) WITH PROPOFOL N/A 03/06/2014   Procedure: ESOPHAGOGASTRODUODENOSCOPY (EGD) WITH PROPOFOL;  Surgeon: [name], MD;  [facility];  Service: Endoscopy;  Laterality: N/A;   LEFT HEART CATH AND CORONARY ANGIOGRAPHY N/A 04/24/2018   Procedure: LEFT HEART CATH AND CORONARY ANGIOGRAPHY;  Surgeon: [name], MD;  Location: [facility];  Service: Cardiovascular;  Laterality: N/A;   LUMBAR FUSION  2009   MALONEY DILATION N/A 03/06/2014   Procedure: MALONEY DILATION 54 french;  Surgeon: [name], MD;  [facility];  Service: Endoscopy;  Laterality: N/A;   NECK SURGERY     TEE WITHOUT CARDIOVERSION N/A 04/27/2018   Procedure: TRANSESOPHAGEAL ECHOCARDIOGRAM (TEE);  Surgeon: [name], MD;  Location: [facility];  Service: Open Heart Surgery;  Laterality: N/A;    There were no vitals filed for this visit.   Subjective Assessment - 11/23/21 1448     Subjective fell from w/c on Saturday when getting up from unlocked  w/c. No injury noted other than sore tailbone but able to sit without terrible discomfort. No chaneg in LE symptoms or parasthesias    Pertinent History 71 y/o male who underwent C3/4 ACDF on 07/19/21 then presented back to the ED on 07/20/21 with cervical stenosis with right greater than left weakness due to C3-4 epidural hematoma; cervical myelopathy. Pt underwent C3/4 revision and evacuation of hematoma, Placement of intervertebral biomechanical device C3-4, globus interbody, and Placement of anterior instrumentation consisting of interbody plate and screws    Patient Stated Goals Be able to walk instead of using w/c; 12/13: ability to get up from floor                [facility] PT Assessment - 11/23/21 1440       Assessment   Medical Diagnosis Acute incomplete quadriplegia                           [facility] Adult PT Treatment/Exercise - 11/23/21 0001       Ambulation/Gait   Ambulation/Gait Yes    Ambulation/Gait Assistance 5: Supervision    Ambulation Distance (Feet) 400 Feet    Assistive device Rolling walker    Stairs Yes    Stairs Assistance 4: Min guard    Stair Management Technique Two rails;Alternating pattern;Step to pattern    Number of Stairs 8    Curb 5: Supervision    Curb Details (indicate cue type and reason) RW    Gait Comments left knee instability with onset of fatigue      Neuro Re-ed    Neuro Re-ed Details  Dynamic seated balance EOM to improve core strength performing 3kg chest pass 2x10, seated on rocker board performing LAQ x 2 min. Sitting on rocker board 5# bar kayak x 2 min. Partial sit-ups EOM 3x10      Knee/Hip Exercises: Standing   Hip Flexion Stengthening;Both;3 sets;10 reps    Hip Flexion Limitations 10#      Knee/Hip Exercises: Seated   Long Arc Quad Strengthening;Both;3 sets;10 reps    Long Arc Quad Weight 10 lbs.    Marching Strengthening;Both;3 sets;10 reps    Marching Limitations 10                       PT  Short Term Goals - 11/23/21 1524       PT SHORT TERM GOAL #1   Title Patient will be able to complete 5x STS in under 15 seconds in order to reduce the risk of falls.    Baseline 20 sec with BUE support, increased time without UE support, 18" seat height    Time 3    Period Weeks    Status On-going    Target Date 11/23/21  PT SHORT TERM GOAL #2   Title Decrease risk for falls per TUG test time 12 sec with least restrictive AD    Baseline 16 sec with RW    Time 3    Period Weeks    Status On-going    Target Date 11/23/21      PT SHORT TERM GOAL #3   Title Patient will report at least 25% improvement in symptoms for improved quality of life.    Baseline 25% improved    Time 3    Period Weeks    Status Achieved    Target Date 10/20/21      PT SHORT TERM GOAL #4   Title Negotiate curb and ascend/descend 12 stairs with supervision to improve safety with mobility    Baseline CGA with RW for curb, CGA with BHR stair ambulation x 8 stairs    Time 2    Period Weeks    Status On-going    Target Date 11/23/21               PT Long Term Goals - 11/23/21 1524       PT LONG TERM GOAL #1   Title Demo 4/5 BLE strength to improve safety and stability for standing ADL and gait    Baseline 4- to 4/5 gross BLE    Time 6    Period Weeks    Status Partially Met    Target Date 12/07/21      PT LONG TERM GOAL #2   Title Demo modified independent ambulation x 350 ft to improve functional ambulation    Baseline 400 ft with supervision with RW    Time 6    Period Weeks    Status On-going    Target Date 12/07/21      PT LONG TERM GOAL #3   Title Ascend/descend curb with least restrictive AD to improve safety with ambulation    Baseline CGA with RW    Time 6    Period Weeks    Status On-going    Target Date 12/07/21                    Patient will benefit from skilled therapeutic intervention in order to improve the following deficits and impairments:      Visit Diagnosis: Other symptoms and signs involving the nervous system  Difficulty in walking, not elsewhere classified  Muscle weakness (generalized)  Unsteadiness on feet  Other abnormalities of gait and mobility     Problem List Patient Active Problem List   Diagnosis Date Noted   Cervical myelopathy ([place])    Hyponatremia    Neurogenic bladder    Pressure injury of skin 08/08/2021   Acute incomplete quadriplegia ([place]) 08/01/2021   Epidural hematoma 07/22/2021   Myelopathy ([place]) 07/19/2021   Callus 05/18/2021   Acute bacterial rhinosinusitis 10/05/2020   Cough in adult 10/05/2020   S/P CABG x 3 04/27/2018   Angina pectoris ([place]) 04/24/2018   Obesity    Chronic combined systolic and diastolic congestive heart failure ([place])    Peripheral edema 06/30/2017   Pedal edema 11/28/2016   Osteopenia 08/13/2015   Thrombocytopenia ([place]) 05/25/2015   Chronic ITP (idiopathic thrombocytopenia) ([place]) 05/25/2015   Hypothyroidism 08/29/2014   Diverticulitis of colon (without mention of hemorrhage)(562.11) 02/04/2014   Dysphagia, unspecified(787.20) 02/04/2014   Hyperlipidemia 11/18/2013   Dyspnea 01/02/2012   Palpitations 01/02/2012   Mixed hyperlipidemia 07/06/2011   Coronary artery disease involving native coronary artery of  native heart with angina pectoris ([place])    TOTAL KNEE FOLLOW-UP 08/14/2008   Chronic back pain 07/23/2008   KNEE, ARTHRITIS, DEGEN./OSTEO 05/29/2008   JOINT EFFUSION, KNEE 04/30/2008   Pain in joint, lower leg 10/10/2007   Essential hypertension 10/09/2007    Toniann Fail, PT 11/23/2021, 3:28 PM  [facility] [place], [place], [address] Phone: [phone]   Fax:  [phone]  Name: [name] MRN: [id number] Date of Birth: 1951-06-05

## 2021-11-23 NOTE — Therapy (Signed)
[facility] [address] [place], [place], [address] Phone: [phone]   Fax:  [phone]  Occupational Therapy Treatment  Patient Details  Name: [name] MRN: [id number] Date of Birth: Mar 19, 1951 Referring Provider (OT): Danella Sensing, NP   Encounter Date: 11/23/2021   OT End of Session - 11/23/21 1424     Visit Number 15    Number of Visits 20    Date for OT Re-Evaluation 12/11/21    Authorization Type UHC Medicare; $30 copay per discipline per visit    Authorization Time Period no visit limit    Progress Note Due on Visit 62    OT Start Time 1346    OT Stop Time 1430    OT Time Calculation (min) 44 min    Activity Tolerance Patient tolerated treatment well    Behavior During Therapy [facility] for tasks assessed/performed             Past Medical History:  Diagnosis Date   Allergy    Carpal tunnel syndrome    Chronic back pain    Chronic combined systolic and diastolic congestive heart failure (HCC)    Chronic ITP (idiopathic thrombocytopenia) (HCC) 05/25/2015   Coronary artery disease    Coronary artery disease involving native coronary artery of native heart with angina pectoris (HCC)    Degenerative disc disease    GERD (gastroesophageal reflux disease)    History of thrombocytopenia    Hypercholesteremia    Hypertension    Hypothyroid    Lumbar pain    Myocardial infarction ([place]) 2009   Obesity    Pneumonia    around age 9   S/P CABG x 3 04/27/2018   LIMA to LAD SVG to OM1 SVG to OM2   Shortness of breath dyspnea     Past Surgical History:  Procedure Laterality Date   ANTERIOR CERVICAL DECOMP/DISCECTOMY FUSION N/A 07/19/2021   Procedure: CERVICAL THREE-FOUR ANTERIOR CERVICAL DECOMPRESSION/DISCECTOMY FUSION;  Surgeon: [name], MD;  Location: [facility];  Service: Neurosurgery;  Laterality: N/A;   ANTERIOR CERVICAL DECOMP/DISCECTOMY FUSION N/A 07/22/2021   Procedure: ANTERIOR CERVICAL DECOMPRESSION/DISCECTOMY  REVISON FUSION  WITH EVACUATION OF EPIDURAL HEMATOMA;  Surgeon: [name], DO;  Location: [facility];  Service: Neurosurgery;  Laterality: N/A;   BACK SURGERY     5 lumbas disc with cervical and lumbar fusions   BIOPSY N/A 03/06/2014   Procedure: ESOPHAGEAL BIOPSIES;  Surgeon: [name], MD;  [facility];  Service: Endoscopy;  Laterality: N/A;   COLONOSCOPY     COLONOSCOPY WITH PROPOFOL N/A 03/06/2014   Procedure: COLONOSCOPY WITH PROPOFOL;  Surgeon: [name], MD;  [facility];  Service: Endoscopy;  Laterality: N/A;  in cecum at 0807; total withdrawal time 9 minutes   CORONARY ANGIOPLASTY WITH STENT PLACEMENT     2000, and 2004 has 3 stents   CORONARY ARTERY BYPASS GRAFT N/A 04/27/2018   Procedure: CORONARY ARTERY BYPASS GRAFTING (CABG) x Three , using left internal mammary artery and right leg greater saphenous vein;  Surgeon: [name], MD;  Location: [facility];  Service: Open Heart Surgery;  Laterality: N/A;   ESOPHAGOGASTRODUODENOSCOPY (EGD) WITH PROPOFOL N/A 03/06/2014   Procedure: ESOPHAGOGASTRODUODENOSCOPY (EGD) WITH PROPOFOL;  Surgeon: [name], MD;  [facility];  Service: Endoscopy;  Laterality: N/A;   LEFT HEART CATH AND CORONARY ANGIOGRAPHY N/A 04/24/2018   Procedure: LEFT HEART CATH AND CORONARY ANGIOGRAPHY;  Surgeon: [name], MD;  Location: [facility];  Service: Cardiovascular;  Laterality: N/A;   LUMBAR FUSION  2009   MALONEY DILATION N/A 03/06/2014   Procedure: MALONEY DILATION 54 french;  Surgeon: [name], MD;  [facility];  Service: Endoscopy;  Laterality: N/A;   NECK SURGERY     TEE WITHOUT CARDIOVERSION N/A 04/27/2018   Procedure: TRANSESOPHAGEAL ECHOCARDIOGRAM (TEE);  Surgeon: [name], MD;  Location: [facility];  Service: Open Heart Surgery;  Laterality: N/A;    There were no vitals filed for this visit.   Subjective Assessment - 11/23/21 1347     Subjective  S: I had a fall.    Currently in Pain? Yes     Pain Score 6     Pain Location Back    Pain Orientation Upper    Pain Descriptors / Indicators Aching;Sore    Pain Type Chronic pain    Pain Radiating Towards N/A    Pain Onset In the past 7 days    Pain Frequency Constant    Aggravating Factors  fall    Pain Relieving Factors rest    Effect of Pain on Daily Activities min effect on ADLs    Multiple Pain Sites No                [facility] OT Assessment - 11/23/21 1346       Assessment   Medical Diagnosis Acute incomplete quadriplegia      Precautions   Precautions Cervical;Fall    Required Braces or Orthoses Cervical Brace    Cervical Brace Soft collar;For comfort                      OT Treatments/Exercises (OP) - 11/23/21 1350       ADLs   Writing Working on tracing and writing activities today. Pt tracing short straight lines with regular BIC pen, then OT provided brown foam for building pen up. Pt reports improved ease of grip, good success tracing various types of lines using built up pen. Pt then progressed to using college ruled notebook paper and wrote 3 sentences using built-up pen. 50% legibility-cursive writing      Exercises   Exercises Hand      Fine Motor Coordination (Hand/Wrist)   Fine Motor Coordination Grooved pegs;Manipulating coins    Manipulating coins Pt holding coins in hand and working on palm to fingertip translation to place coins in container. Mod difficulty, occasionally dropping coins.    Grooved pegs Pt placing pegs with right hand, holding 3 pegs at at time while placing. Dropping pegs frequently, increased time for placement. Pt placing all pegs in approximately 11 minutes.                      OT Short Term Goals - 11/11/21 1403       OT SHORT TERM GOAL #1   Title Pt will be provided with and educated on HEP to improve mobility of BUE required for ADL task completion.    Time 4    Period Weeks    Status Achieved    Target Date 10/14/21      OT SHORT TERM  GOAL #2   Title Pt will demonstrate RUE A/ROM WFL to improve ability to reach overhead and behind back during dressing tasks.    Time 4    Period Weeks    Status Partially Met      OT SHORT TERM GOAL #3  Title Pt will demonstrate improved fine motor coordination required for self-feeding by completing 9 hole peg test in under 1' 15"  for the left hand and in under 6' for the right hand.    Baseline 12/13: right hand 1'46", left hand 1' 25"  1/12: right hand 1'34"; left hand 53.58"    Time 4    Period Weeks    Status Achieved      OT SHORT TERM GOAL #4   Title Pt will demonstrate improved grip strength of at least 35# required for holding items with right hand.    Time 4    Period Weeks               OT Long Term Goals - 11/11/21 1405       OT LONG TERM GOAL #1   Title Pt will increase RUE strength to 4-/5 or greater to improve ability to use RUE as dominant during simple ADLs.    Time 8    Period Weeks    Status Revised      OT LONG TERM GOAL #2   Title Pt will increase right grip strength to 40# or greater and pinch strength to 12# or greater to improve ability to complete functional tasks requiring sustained grip on objects.    Time 8    Period Weeks      OT LONG TERM GOAL #3   Title Pt will increase fine motor coordination required for operating buttons or tying shoes by completing 9 hole peg test in under 50' with left hand and under 1'10" with right hand.    Baseline 12/13: right hand 1' 49" , left hand 1' 25" ; 1/12: right hand 1'34" left hand 53.58"    Time 8    Period Weeks    Status Revised      OT LONG TERM GOAL #4   Title Pt will be educated on AE available to faciliate independence in ADLs.    Time 8    Period Weeks    Status On-going                   Plan - 11/23/21 1409     Clinical Impression Statement A: Pt reports he has been working on his handwriting. Tracing several lines and writing sentences today, OT providing foam for built-up  grip with improved legibility. Pt also working on fine motor coordination using grooved pegboard, graded activity up by holding multiple pegs while working to place. Added coin manipulation task, pt with mod difficulty maintaining hold on coins in hand, increased time for completion. Discussed practice activities for home including coin work and Research scientist (life sciences), provided foam to build up pens at home.    Body Structure / Function / Physical Skills ADL;Endurance;UE functional use;Pain;ROM;IADL;Strength;Mobility;Coordination    Plan P: Continue with coordination tasks-combine functional reaching with fine motor work    Ko [place]ina 11/15: red theraputty exercises; 11/17: supine A/ROM; 11/21: hand A/ROM; 12/13: seated AA/ROM; 1/17: pencil control packet for tracing    Consulted and Agree with Plan of Care Patient             Patient will benefit from skilled therapeutic intervention in order to improve the following deficits and impairments:   Body Structure / Function / Physical Skills: ADL, Endurance, UE functional use, Pain, ROM, IADL, Strength, Mobility, Coordination       Visit Diagnosis: Other lack of coordination  Other symptoms and signs involving the nervous system    Problem  List Patient Active Problem List   Diagnosis Date Noted   Cervical myelopathy ([place])    Hyponatremia    Neurogenic bladder    Pressure injury of skin 08/08/2021   Acute incomplete quadriplegia ([place]) 08/01/2021   Epidural hematoma 07/22/2021   Myelopathy ([place]) 07/19/2021   Callus 05/18/2021   Acute bacterial rhinosinusitis 10/05/2020   Cough in adult 10/05/2020   S/P CABG x 3 04/27/2018   Angina pectoris ([place]) 04/24/2018   Obesity    Chronic combined systolic and diastolic congestive heart failure ([place])    Peripheral edema 06/30/2017   Pedal edema 11/28/2016   Osteopenia 08/13/2015   Thrombocytopenia ([place]) 05/25/2015   Chronic ITP (idiopathic thrombocytopenia) ([place]) 05/25/2015    Hypothyroidism 08/29/2014   Diverticulitis of colon (without mention of hemorrhage)(562.11) 02/04/2014   Dysphagia, unspecified(787.20) 02/04/2014   Hyperlipidemia 11/18/2013   Dyspnea 01/02/2012   Palpitations 01/02/2012   Mixed hyperlipidemia 07/06/2011   Coronary artery disease involving native coronary artery of native heart with angina pectoris ([place])    TOTAL KNEE FOLLOW-UP 08/14/2008   Chronic back pain 07/23/2008   KNEE, ARTHRITIS, DEGEN./OSTEO 05/29/2008   JOINT EFFUSION, KNEE 04/30/2008   Pain in joint, lower leg 10/10/2007   Essential hypertension 10/09/2007    Guadelupe Sabin, OTR/L  [phone] 11/23/2021, 2:30 PM  [facility] [place], [place], [address] Phone: [phone]   Fax:  [phone]  Name: [name] MRN: [id number] Date of Birth: 14-Jun-1951

## 2021-11-24 ENCOUNTER — Telehealth (HOSPITAL_COMMUNITY): Payer: Self-pay

## 2021-11-24 NOTE — Telephone Encounter (Signed)
Claiborne Billings will not be in the office 11/30/21 need to r/s - l/m for patient to call back to r/s this PT appointment.

## 2021-11-25 ENCOUNTER — Ambulatory Visit (HOSPITAL_COMMUNITY): Payer: Medicare Other | Admitting: Physical Therapy

## 2021-11-25 ENCOUNTER — Other Ambulatory Visit: Payer: Self-pay

## 2021-11-25 ENCOUNTER — Encounter (HOSPITAL_COMMUNITY): Payer: Self-pay | Admitting: Physical Therapy

## 2021-11-25 ENCOUNTER — Encounter (HOSPITAL_COMMUNITY): Payer: Self-pay | Admitting: Occupational Therapy

## 2021-11-25 ENCOUNTER — Ambulatory Visit (HOSPITAL_COMMUNITY): Payer: Medicare Other | Admitting: Occupational Therapy

## 2021-11-25 DIAGNOSIS — R278 Other lack of coordination: Secondary | ICD-10-CM

## 2021-11-25 DIAGNOSIS — M6281 Muscle weakness (generalized): Secondary | ICD-10-CM | POA: Diagnosis not present

## 2021-11-25 DIAGNOSIS — R2681 Unsteadiness on feet: Secondary | ICD-10-CM | POA: Diagnosis not present

## 2021-11-25 DIAGNOSIS — R2689 Other abnormalities of gait and mobility: Secondary | ICD-10-CM | POA: Diagnosis not present

## 2021-11-25 DIAGNOSIS — R29818 Other symptoms and signs involving the nervous system: Secondary | ICD-10-CM | POA: Diagnosis not present

## 2021-11-25 DIAGNOSIS — R262 Difficulty in walking, not elsewhere classified: Secondary | ICD-10-CM

## 2021-11-25 NOTE — Therapy (Signed)
[facility] [address] [place], [place], [address] Phone: [phone]   Fax:  [phone]  Occupational Therapy Treatment  Patient Details  Name: [name] MRN: [id number] Date of Birth: Nov 06, 1950 Referring Provider (OT): Danella Sensing, NP   Encounter Date: 11/25/2021   OT End of Session - 11/25/21 1421     Visit Number 16    Number of Visits 20    Date for OT Re-Evaluation 12/11/21    Authorization Type U[employer] Medicare; $30 copay per discipline per visit    Authorization Time Period no visit limit    Progress Note Due on Visit 22    OT Start Time 1347    OT Stop Time 1426    OT Time Calculation (min) 39 min    Activity Tolerance Patient tolerated treatment well    Behavior During Therapy [facility] for tasks assessed/performed             Past Medical History:  Diagnosis Date   Allergy    Carpal tunnel syndrome    Chronic back pain    Chronic combined systolic and diastolic congestive heart failure (HCC)    Chronic ITP (idiopathic thrombocytopenia) ([place]) 05/25/2015   Coronary artery disease    Coronary artery disease involving native coronary artery of native heart with angina pectoris (HCC)    Degenerative disc disease    GERD (gastroesophageal reflux disease)    History of thrombocytopenia    Hypercholesteremia    Hypertension    Hypothyroid    Lumbar pain    Myocardial infarction ([place]) 2009   Obesity    Pneumonia    around age 36   S/P CABG x 3 04/27/2018   LIMA to LAD SVG to OM1 SVG to OM2   Shortness of breath dyspnea     Past Surgical History:  Procedure Laterality Date   ANTERIOR CERVICAL DECOMP/DISCECTOMY FUSION N/A 07/19/2021   Procedure: CERVICAL THREE-FOUR ANTERIOR CERVICAL DECOMPRESSION/DISCECTOMY FUSION;  Surgeon: [name], MD;  Location: [facility];  Service: Neurosurgery;  Laterality: N/A;   ANTERIOR CERVICAL DECOMP/DISCECTOMY FUSION N/A 07/22/2021   Procedure: ANTERIOR CERVICAL DECOMPRESSION/DISCECTOMY  REVISON FUSION  WITH EVACUATION OF EPIDURAL HEMATOMA;  Surgeon: [name], DO;  Location: [facility];  Service: Neurosurgery;  Laterality: N/A;   BACK SURGERY     5 lumbas disc with cervical and lumbar fusions   BIOPSY N/A 03/06/2014   Procedure: ESOPHAGEAL BIOPSIES;  Surgeon: [name], MD;  [facility];  Service: Endoscopy;  Laterality: N/A;   COLONOSCOPY     COLONOSCOPY WITH PROPOFOL N/A 03/06/2014   Procedure: COLONOSCOPY WITH PROPOFOL;  Surgeon: [name], MD;  Location: [facility];  Service: Endoscopy;  Laterality: N/A;  in cecum at 0807; total withdrawal time 9 minutes   CORONARY ANGIOPLASTY WITH STENT PLACEMENT     2000, and 2004 has 3 stents   CORONARY ARTERY BYPASS GRAFT N/A 04/27/2018   Procedure: CORONARY ARTERY BYPASS GRAFTING (CABG) x Three , using left internal mammary artery and right leg greater saphenous vein;  Surgeon: [name], MD;  Location: [facility];  Service: Open Heart Surgery;  Laterality: N/A;   ESOPHAGOGASTRODUODENOSCOPY (EGD) WITH PROPOFOL N/A 03/06/2014   Procedure: ESOPHAGOGASTRODUODENOSCOPY (EGD) WITH PROPOFOL;  Surgeon: [name], MD;  [facility];  Service: Endoscopy;  Laterality: N/A;   LEFT HEART CATH AND CORONARY ANGIOGRAPHY N/A 04/24/2018   Procedure: LEFT HEART CATH AND CORONARY ANGIOGRAPHY;  Surgeon: [name], MD;  Location: [facility];  Service: Cardiovascular;  Laterality: N/A;   LUMBAR FUSION  2009   MALONEY DILATION N/A 03/06/2014   Procedure: MALONEY DILATION 54 french;  Surgeon: [name], MD;  [facility];  Service: Endoscopy;  Laterality: N/A;   NECK SURGERY     TEE WITHOUT CARDIOVERSION N/A 04/27/2018   Procedure: TRANSESOPHAGEAL ECHOCARDIOGRAM (TEE);  Surgeon: [name], MD;  Location: [facility];  Service: Open Heart Surgery;  Laterality: N/A;    There were no vitals filed for this visit.   Subjective Assessment - 11/25/21 1347     Subjective  S: That foam helped a little bit.     Currently in Pain? Yes    Pain Score 5     Pain Location Back    Pain Orientation Lower    Pain Descriptors / Indicators Aching;Sore    Pain Type Chronic pain    Pain Radiating Towards N/A    Pain Onset In the past 7 days    Pain Frequency Constant    Aggravating Factors  fall    Pain Relieving Factors rest    Effect of Pain on Daily Activities min effect on ADLs    Multiple Pain Sites No                [facility] OT Assessment - 11/25/21 1347       Assessment   Medical Diagnosis Acute incomplete quadriplegia      Precautions   Precautions Cervical;Fall    Required Braces or Orthoses Cervical Brace    Cervical Brace Soft collar;For comfort                      OT Treatments/Exercises (OP) - 11/25/21 1354       Exercises   Exercises Hand      Fine Motor Coordination (Hand/Wrist)   Fine Motor Coordination Grooved pegs;Small Pegboard    Small Pegboard Pt using tweezers to place pegs into pegboard, increased time required for task. Pt using vision to compensate for sensation deficits. OT demonstrating how to pick up pegs from the cup portion instead of along the stem.    Grooved pegs Pt attempting to place grooved pegs with tweezers, unable to complete                      OT Short Term Goals - 11/11/21 1403       OT SHORT TERM GOAL #1   Title Pt will be provided with and educated on HEP to improve mobility of BUE required for ADL task completion.    Time 4    Period Weeks    Status Achieved    Target Date 10/14/21      OT SHORT TERM GOAL #2   Title Pt will demonstrate RUE A/ROM WFL to improve ability to reach overhead and behind back during dressing tasks.    Time 4    Period Weeks    Status Partially Met      OT SHORT TERM GOAL #3   Title Pt will demonstrate improved fine motor coordination required for self-feeding by completing 9 hole peg test in under _0  for the left hand and in under 6' for the right hand.    Baseline 12/13:  right hand 1'46", left hand _1  1/12: right hand 1'34"; left hand 53.58"    Time 4    Period Weeks    Status Achieved  OT SHORT TERM GOAL #4   Title Pt will demonstrate improved grip strength of at least 35# required for holding items with right hand.    Time 4    Period Weeks               OT Long Term Goals - 11/11/21 1405       OT LONG TERM GOAL #1   Title Pt will increase RUE strength to 4-/5 or greater to improve ability to use RUE as dominant during simple ADLs.    Time 8    Period Weeks    Status Revised      OT LONG TERM GOAL #2   Title Pt will increase right grip strength to 40# or greater and pinch strength to 12# or greater to improve ability to complete functional tasks requiring sustained grip on objects.    Time 8    Period Weeks      OT LONG TERM GOAL #3   Title Pt will increase fine motor coordination required for operating buttons or tying shoes by completing 9 hole peg test in under 50' with left hand and under 1'10" with right hand.    Baseline 12/13: right hand _0 , left hand _1 ; 1/12: right hand 1'34" left hand 53.58"    Time 8    Period Weeks    Status Revised      OT LONG TERM GOAL #4   Title Pt will be educated on AE available to faciliate independence in ADLs.    Time 8    Period Weeks    Status On-going                   Plan - 11/25/21 1420     Clinical Impression Statement A: Continued with fine motor activity today, pt working on using tweezers and holding in a tripod grasp, as you would a pencil, to place pegs into pegboard. Pt with mod difficulty with task, requiring increased time for completion. Verbal cuing for technique as needed.    Body Structure / Function / Physical Skills ADL;Endurance;UE functional use;Pain;ROM;IADL;Strength;Mobility;Coordination    Plan P: Progress note. Continue with coordination tasks-combine functional reaching with fine motor work    [place] 11/15: red theraputty  exercises; 11/17: supine A/ROM; 11/21: hand A/ROM; 12/13: seated AA/ROM; 1/17: pencil control packet for tracing    Consulted and Agree with Plan of Care Patient             Patient will benefit from skilled therapeutic intervention in order to improve the following deficits and impairments:   Body Structure / Function / Physical Skills: ADL, Endurance, UE functional use, Pain, ROM, IADL, Strength, Mobility, Coordination       Visit Diagnosis: Other symptoms and signs involving the nervous system  Other lack of coordination    Problem List Patient Active Problem List   Diagnosis Date Noted   Cervical myelopathy ([place])    Hyponatremia    Neurogenic bladder    Pressure injury of skin 08/08/2021   Acute incomplete quadriplegia ([place]) 08/01/2021   Epidural hematoma 07/22/2021   Myelopathy ([place]) 07/19/2021   Callus 05/18/2021   Acute bacterial rhinosinusitis 10/05/2020   Cough in adult 10/05/2020   S/P CABG x 3 04/27/2018   Angina pectoris ([place]) 04/24/2018   Obesity    Chronic combined systolic and diastolic congestive heart failure ([place])    Peripheral edema 06/30/2017   Pedal edema 11/28/2016   Osteopenia 08/13/2015  Thrombocytopenia ([place]) 05/25/2015   Chronic ITP (idiopathic thrombocytopenia) (HCC) 05/25/2015   Hypothyroidism 08/29/2014   Diverticulitis of colon (without mention of hemorrhage)(562.11) 02/04/2014   Dysphagia, unspecified(787.20) 02/04/2014   Hyperlipidemia 11/18/2013   Dyspnea 01/02/2012   Palpitations 01/02/2012   Mixed hyperlipidemia 07/06/2011   Coronary artery disease involving native coronary artery of native heart with angina pectoris ([place])    TOTAL KNEE FOLLOW-UP 08/14/2008   Chronic back pain 07/23/2008   KNEE, ARTHRITIS, DEGEN./OSTEO 05/29/2008   JOINT EFFUSION, KNEE 04/30/2008   Pain in joint, lower leg 10/10/2007   Essential hypertension 10/09/2007    Guadelupe Sabin, OTR/L  [phone] 11/25/2021, 2:30 PM  [place], [place], [address] Phone: [phone]   Fax:  [phone]  Name: [name] MRN: [id number] Date of Birth: 20-Jul-1951

## 2021-11-25 NOTE — Therapy (Signed)
[facility] [address] [place], [place], [address] Phone: [phone]   Fax:  [phone]  Physical Therapy Treatment  Patient Details  Name: [name] MRN: [id number] Date of Birth: 1951/01/12 Referring Provider (PT): [name], NP   Encounter Date: 11/25/2021   PT End of Session - 11/25/21 1434     Visit Number 13    Number of Visits 24    Date for PT Re-Evaluation 01/04/22    Authorization Type UHC Medicare    Progress Note Due on Visit 18    PT Start Time 1431    PT Stop Time 1511    PT Time Calculation (min) 40 min    Equipment Utilized During Treatment Gait belt    Activity Tolerance Patient tolerated treatment well    Behavior During Therapy WFL for tasks assessed/performed             Past Medical History:  Diagnosis Date   Allergy    Carpal tunnel syndrome    Chronic back pain    Chronic combined systolic and diastolic congestive heart failure (HCC)    Chronic ITP (idiopathic thrombocytopenia) ([place]) 05/25/2015   Coronary artery disease    Coronary artery disease involving native coronary artery of native heart with angina pectoris ([place])    Degenerative disc disease    GERD (gastroesophageal reflux disease)    History of thrombocytopenia    Hypercholesteremia    Hypertension    Hypothyroid    Lumbar pain    Myocardial infarction ([place]) 2009   Obesity    Pneumonia    around age 4   S/P CABG x 3 04/27/2018   LIMA to LAD SVG to OM1 SVG to OM2   Shortness of breath dyspnea     Past Surgical History:  Procedure Laterality Date   ANTERIOR CERVICAL DECOMP/DISCECTOMY FUSION N/A 07/19/2021   Procedure: CERVICAL THREE-FOUR ANTERIOR CERVICAL DECOMPRESSION/DISCECTOMY FUSION;  Surgeon: [name], MD;  Location: [facility];  Service: Neurosurgery;  Laterality: N/A;   ANTERIOR CERVICAL DECOMP/DISCECTOMY FUSION N/A 07/22/2021   Procedure: ANTERIOR CERVICAL DECOMPRESSION/DISCECTOMY REVISON FUSION  WITH EVACUATION  OF EPIDURAL HEMATOMA;  Surgeon: [name], DO;  Location: [facility];  Service: Neurosurgery;  Laterality: N/A;   BACK SURGERY     5 lumbas disc with cervical and lumbar fusions   BIOPSY N/A 03/06/2014   Procedure: ESOPHAGEAL BIOPSIES;  Surgeon: [name], MD;  [facility];  Service: Endoscopy;  Laterality: N/A;   COLONOSCOPY     COLONOSCOPY WITH PROPOFOL N/A 03/06/2014   Procedure: COLONOSCOPY WITH PROPOFOL;  Surgeon: [name], MD;  [facility];  Service: Endoscopy;  Laterality: N/A;  in cecum at 0807; total withdrawal time 9 minutes   CORONARY ANGIOPLASTY WITH STENT PLACEMENT     2000, and 2004 has 3 stents   CORONARY ARTERY BYPASS GRAFT N/A 04/27/2018   Procedure: CORONARY ARTERY BYPASS GRAFTING (CABG) x Three , using left internal mammary artery and right leg greater saphenous vein;  Surgeon: [name], MD;  Location: [facility];  Service: Open Heart Surgery;  Laterality: N/A;   ESOPHAGOGASTRODUODENOSCOPY (EGD) WITH PROPOFOL N/A 03/06/2014   Procedure: ESOPHAGOGASTRODUODENOSCOPY (EGD) WITH PROPOFOL;  Surgeon: [name], MD;  [facility];  Service: Endoscopy;  Laterality: N/A;   LEFT HEART CATH AND CORONARY ANGIOGRAPHY N/A 04/24/2018   Procedure: LEFT HEART CATH AND CORONARY ANGIOGRAPHY;  Surgeon: [name], MD;  Location: [facility];  Service: Cardiovascular;  Laterality: N/A;   LUMBAR FUSION  2009   MALONEY DILATION N/A 03/06/2014   Procedure: MALONEY DILATION 54 french;  Surgeon: [name], MD;  [facility];  Service: Endoscopy;  Laterality: N/A;   NECK SURGERY     TEE WITHOUT CARDIOVERSION N/A 04/27/2018   Procedure: TRANSESOPHAGEAL ECHOCARDIOGRAM (TEE);  Surgeon: [name], MD;  Location: [facility];  Service: Open Heart Surgery;  Laterality: N/A;    There were no vitals filed for this visit.   Subjective Assessment - 11/25/21 1433     Subjective Doing fine". Notes he is getting stronger.    Pertinent History 71 y/o male  who underwent C3/4 ACDF on 07/19/21 then presented back to the ED on 07/20/21 with cervical stenosis with right greater than left weakness due to C3-4 epidural hematoma; cervical myelopathy. Pt underwent C3/4 revision and evacuation of hematoma, Placement of intervertebral biomechanical device C3-4, globus interbody, and Placement of anterior instrumentation consisting of interbody plate and screws    Patient Stated Goals Be able to walk instead of using w/c; 12/13: ability to get up from floor    Currently in Pain? No/denies                               [facility] Adult PT Treatment/Exercise - 11/25/21 0001       Knee/Hip Exercises: Standing   Knee Flexion Both;2 sets;10 reps    Knee Flexion Limitations 5 lb    Hip Flexion Both;2 sets;10 reps    Hip Flexion Limitations 5lb    Gait Training 450 feet with RW beginning of session and 450 feet end of session      Knee/Hip Exercises: Seated   Long Arc Quad Both;2 sets;10 reps;Weights    Long Arc Quad Weight 5 lbs.    Other Seated Knee/Hip Exercises clams BTB 15 x 5"    Other Seated Knee/Hip Exercises band rows GTB x20, GTB palloff press x20 each    Sit to Sand 10 reps;2 sets                       PT Short Term Goals - 11/23/21 1524       PT SHORT TERM GOAL #1   Title Patient will be able to complete 5x STS in under 15 seconds in order to reduce the risk of falls.    Baseline 20 sec with BUE support, increased time without UE support, 18" seat height    Time 3    Period Weeks    Status On-going    Target Date 11/23/21      PT SHORT TERM GOAL #2   Title Decrease risk for falls per TUG test time 12 sec with least restrictive AD    Baseline 16 sec with RW    Time 3    Period Weeks    Status On-going    Target Date 11/23/21      PT SHORT TERM GOAL #3   Title Patient will report at least 25% improvement in symptoms for improved quality of life.    Baseline 25% improved    Time 3    Period Weeks     Status Achieved    Target Date 10/20/21      PT SHORT TERM GOAL #4   Title Negotiate curb and ascend/descend 12 stairs with supervision to improve safety with mobility    Baseline CGA  with RW for curb, CGA with BHR stair ambulation x 8 stairs    Time 2    Period Weeks    Status On-going    Target Date 11/23/21               PT Long Term Goals - 11/23/21 1524       PT LONG TERM GOAL #1   Title Demo 4/5 BLE strength to improve safety and stability for standing ADL and gait    Baseline 4- to 4/5 gross BLE    Time 6    Period Weeks    Status Partially Met    Target Date 12/07/21      PT LONG TERM GOAL #2   Title Demo modified independent ambulation x 350 ft to improve functional ambulation    Baseline 400 ft with supervision with RW    Time 6    Period Weeks    Status On-going    Target Date 12/07/21      PT LONG TERM GOAL #3   Title Ascend/descend curb with least restrictive AD to improve safety with ambulation    Baseline CGA with RW    Time 6    Period Weeks    Status On-going    Target Date 12/07/21                   Plan - 11/25/21 1513     Clinical Impression Statement Patient showing good effort with ther ex today. Progressed seated core and LE strengthening exercise. Patient demos knee valgus during sit to stands due to hip weakness. Added seated clamshells with band to address. Increased overall distance ambulated during session. Patient demos good performance but does note mild to moderate fatigue following session. Patient will continue to benefit from skilled therapy services to reduce deficits and improve functional level.    Personal Factors and Comorbidities Age;Comorbidity 1;Time since onset of injury/illness/exacerbation    Comorbidities hx of multiple spine surgeries    Examination-Activity Limitations Bathing;Bend;Carry;Lift;Toileting;Stand;Stairs;Squat;Reach Overhead;Locomotion Level;Transfers    Examination-Participation Restrictions  Cleaning;Yard Work;Meal Prep;Driving;Community Activity    Stability/Clinical Decision Making Evolving/Moderate complexity    Rehab Potential Good    PT Frequency 2x / week    PT Duration 6 weeks    PT Treatment/Interventions ADLs/Self Care Home Management;Electrical Stimulation;DME Instruction;Gait training;Stair training;Functional mobility training;Therapeutic activities;Therapeutic exercise;Balance training;Patient/family education;Neuromuscular re-education;Wheelchair mobility training;Manual techniques;Dry needling    PT Next Visit Plan Progress gait, balance and functional hip strengthening. Step ups, possibly power tower    PT Home Exercise Plan SLR; 12/13: bridge and clam; 12/15: chair push up; 12/20 sit to stand; supine hip abduction 12/22 band clamshell, laq    Consulted and Agree with Plan of Care Patient             Patient will benefit from skilled therapeutic intervention in order to improve the following deficits and impairments:  Abnormal gait, Decreased activity tolerance, Decreased balance, Decreased mobility, Decreased knowledge of use of DME, Decreased endurance, Decreased coordination, Decreased strength, Difficulty walking, Postural dysfunction, Improper body mechanics, Pain  Visit Diagnosis: Other symptoms and signs involving the nervous system  Other lack of coordination  Difficulty in walking, not elsewhere classified  Muscle weakness (generalized)     Problem List Patient Active Problem List   Diagnosis Date Noted   Cervical myelopathy ([place])    Hyponatremia    Neurogenic bladder    Pressure injury of skin 08/08/2021   Acute incomplete quadriplegia ([place]) 08/01/2021   Epidural  hematoma 07/22/2021   Myelopathy ([place]) 07/19/2021   Callus 05/18/2021   Acute bacterial rhinosinusitis 10/05/2020   Cough in adult 10/05/2020   S/P CABG x 3 04/27/2018   Angina pectoris ([place]) 04/24/2018   Obesity    Chronic combined systolic and diastolic congestive heart  failure ([place])    Peripheral edema 06/30/2017   Pedal edema 11/28/2016   Osteopenia 08/13/2015   Thrombocytopenia ([place]) 05/25/2015   Chronic ITP (idiopathic thrombocytopenia) ([place]) 05/25/2015   Hypothyroidism 08/29/2014   Diverticulitis of colon (without mention of hemorrhage)(562.11) 02/04/2014   Dysphagia, unspecified(787.20) 02/04/2014   Hyperlipidemia 11/18/2013   Dyspnea 01/02/2012   Palpitations 01/02/2012   Mixed hyperlipidemia 07/06/2011   Coronary artery disease involving native coronary artery of native heart with angina pectoris ([place])    TOTAL KNEE FOLLOW-UP 08/14/2008   Chronic back pain 07/23/2008   KNEE, ARTHRITIS, DEGEN./OSTEO 05/29/2008   JOINT EFFUSION, KNEE 04/30/2008   Pain in joint, lower leg 10/10/2007   Essential hypertension 10/09/2007   4:31 PM, 11/25/21 Josue Hector PT DPT  Physical Therapist with [facility]  [phone] [place], [place], [address] Phone: [phone]   Fax:  [phone]  Name: [name] MRN: [id number] Date of Birth: [birth year]

## 2021-11-25 NOTE — Patient Instructions (Signed)
Access Code: [email or web address] URL: [email or web address] Date: 11/25/2021 Prepared by: Josue Hector  Exercises Seated Hip Abduction with Resistance - 2-3 x daily - 7 x weekly - 2 sets - 10 reps - 5 second hold

## 2021-11-29 ENCOUNTER — Other Ambulatory Visit: Payer: Self-pay

## 2021-11-29 DIAGNOSIS — I10 Essential (primary) hypertension: Secondary | ICD-10-CM

## 2021-11-29 DIAGNOSIS — Z0000 Encounter for general adult medical examination without abnormal findings: Secondary | ICD-10-CM

## 2021-11-29 DIAGNOSIS — I5042 Chronic combined systolic (congestive) and diastolic (congestive) heart failure: Secondary | ICD-10-CM

## 2021-11-29 DIAGNOSIS — E038 Other specified hypothyroidism: Secondary | ICD-10-CM

## 2021-11-29 DIAGNOSIS — I25118 Atherosclerotic heart disease of native coronary artery with other forms of angina pectoris: Secondary | ICD-10-CM

## 2021-11-29 DIAGNOSIS — R200 Anesthesia of skin: Secondary | ICD-10-CM

## 2021-11-29 DIAGNOSIS — D696 Thrombocytopenia, unspecified: Secondary | ICD-10-CM

## 2021-11-29 DIAGNOSIS — E7849 Other hyperlipidemia: Secondary | ICD-10-CM

## 2021-11-29 MED ORDER — CITALOPRAM HYDROBROMIDE 20 MG PO TABS: 20.0000 mg | ORAL_TABLET | Freq: Every morning | ORAL | 1 refills | Status: DC

## 2021-11-29 NOTE — Telephone Encounter (Signed)
[name] has not been seen in the office for follow up.  Hospital discharge 08/01/2021. He has a follow up appointment  with you on 12/10/2021.

## 2021-11-30 ENCOUNTER — Other Ambulatory Visit: Payer: Self-pay

## 2021-11-30 ENCOUNTER — Ambulatory Visit (HOSPITAL_COMMUNITY): Payer: Medicare Other | Admitting: Occupational Therapy

## 2021-11-30 ENCOUNTER — Encounter (HOSPITAL_COMMUNITY): Payer: Self-pay | Admitting: Occupational Therapy

## 2021-11-30 ENCOUNTER — Encounter (HOSPITAL_COMMUNITY): Payer: Medicare Other

## 2021-11-30 DIAGNOSIS — R29818 Other symptoms and signs involving the nervous system: Secondary | ICD-10-CM | POA: Diagnosis not present

## 2021-11-30 DIAGNOSIS — R278 Other lack of coordination: Secondary | ICD-10-CM | POA: Diagnosis not present

## 2021-11-30 DIAGNOSIS — R2681 Unsteadiness on feet: Secondary | ICD-10-CM | POA: Diagnosis not present

## 2021-11-30 DIAGNOSIS — M6281 Muscle weakness (generalized): Secondary | ICD-10-CM | POA: Diagnosis not present

## 2021-11-30 DIAGNOSIS — R262 Difficulty in walking, not elsewhere classified: Secondary | ICD-10-CM | POA: Diagnosis not present

## 2021-11-30 DIAGNOSIS — R2689 Other abnormalities of gait and mobility: Secondary | ICD-10-CM | POA: Diagnosis not present

## 2021-11-30 NOTE — Therapy (Addendum)
[place], [place], [address] Phone: [phone]   Fax:  [phone]  Occupational Therapy Treatment  Patient Details  Name: [name] MRN: [id number] Date of Birth: May 10, 1951 Referring Provider (OT): Danella Sensing, NP   Progress Note Reporting Period 10/14/2021 to 11/30/21  See note below for Objective Data and Assessment of Progress/Goals.        Encounter Date: 11/30/2021   OT End of Session - 11/30/21 1516     Visit Number 17    Number of Visits 20    Date for OT Re-Evaluation 12/11/21    Authorization Type [employer] Medicare; $30 copay per discipline per visit    Authorization Time Period no visit limit    Progress Note Due on Visit 34    OT Start Time 1348    OT Stop Time 1426    OT Time Calculation (min) 38 min    Activity Tolerance Patient tolerated treatment well    Behavior During Therapy WFL for tasks assessed/performed             Past Medical History:  Diagnosis Date   Allergy    Carpal tunnel syndrome    Chronic back pain    Chronic combined systolic and diastolic congestive heart failure (HCC)    Chronic ITP (idiopathic thrombocytopenia) (HCC) 05/25/2015   Coronary artery disease    Coronary artery disease involving native coronary artery of native heart with angina pectoris (HCC)    Degenerative disc disease    GERD (gastroesophageal reflux disease)    History of thrombocytopenia    Hypercholesteremia    Hypertension    Hypothyroid    Lumbar pain    Myocardial infarction ([place]) 2009   Obesity    Pneumonia    around age 76   S/P CABG x 3 04/27/2018   LIMA to LAD SVG to OM1 SVG to OM2   Shortness of breath dyspnea     Past Surgical History:  Procedure Laterality Date   ANTERIOR CERVICAL DECOMP/DISCECTOMY FUSION N/A 07/19/2021   Procedure: CERVICAL THREE-FOUR ANTERIOR CERVICAL DECOMPRESSION/DISCECTOMY FUSION;  Surgeon: [name], MD;  Location: [facility];  Service:  Neurosurgery;  Laterality: N/A;   ANTERIOR CERVICAL DECOMP/DISCECTOMY FUSION N/A 07/22/2021   Procedure: ANTERIOR CERVICAL DECOMPRESSION/DISCECTOMY REVISON FUSION  WITH EVACUATION OF EPIDURAL HEMATOMA;  Surgeon: [name], DO;  Location: [facility];  Service: Neurosurgery;  Laterality: N/A;   BACK SURGERY     5 lumbas disc with cervical and lumbar fusions   BIOPSY N/A 03/06/2014   Procedure: ESOPHAGEAL BIOPSIES;  Surgeon: [name], MD;  [facility];  Service: Endoscopy;  Laterality: N/A;   COLONOSCOPY     COLONOSCOPY WITH PROPOFOL N/A 03/06/2014   Procedure: COLONOSCOPY WITH PROPOFOL;  Surgeon: [name], MD;  Location: [facility];  Service: Endoscopy;  Laterality: N/A;  in cecum at 0807; total withdrawal time 9 minutes   CORONARY ANGIOPLASTY WITH STENT PLACEMENT     2000, and 2004 has 3 stents   CORONARY ARTERY BYPASS GRAFT N/A 04/27/2018   Procedure: CORONARY ARTERY BYPASS GRAFTING (CABG) x Three , using left internal mammary artery and right leg greater saphenous vein;  Surgeon: [name], MD;  Location: [facility];  Service: Open Heart Surgery;  Laterality: N/A;   ESOPHAGOGASTRODUODENOSCOPY (EGD) WITH PROPOFOL N/A 03/06/2014   Procedure: ESOPHAGOGASTRODUODENOSCOPY (EGD) WITH PROPOFOL;  Surgeon: [name], MD;  [facility];  Service: Endoscopy;  Laterality: N/A;  LEFT HEART CATH AND CORONARY ANGIOGRAPHY N/A 04/24/2018   Procedure: LEFT HEART CATH AND CORONARY ANGIOGRAPHY;  Surgeon: [name], MD;  Location: [facility];  Service: Cardiovascular;  Laterality: N/A;   LUMBAR FUSION  2009   MALONEY DILATION N/A 03/06/2014   Procedure: MALONEY DILATION 54 french;  Surgeon: [name], MD;  [facility];  Service: Endoscopy;  Laterality: N/A;   NECK SURGERY     TEE WITHOUT CARDIOVERSION N/A 04/27/2018   Procedure: TRANSESOPHAGEAL ECHOCARDIOGRAM (TEE);  Surgeon: [name], MD;  Location: [facility];  Service: Open Heart Surgery;  Laterality: N/A;     There were no vitals filed for this visit.   Subjective Assessment - 11/30/21 1349     Subjective  S: Picking up coins is hard.    Currently in Pain? No/denies                [facility] OT Assessment - 11/30/21 1348       Assessment   Medical Diagnosis Acute incomplete quadriplegia      Precautions   Precautions Cervical;Fall    Required Braces or Orthoses Cervical Brace    Cervical Brace Soft collar;For comfort                      OT Treatments/Exercises (OP) - 11/30/21 1348       Exercises   Exercises Hand      Hand Exercises   Other Hand Exercises Pt lining up 10 sponges and flicking across table with right hand, 1st trial with thumb and index finger, 2nd trial with thumb and middle finger      Fine Motor Coordination (Hand/Wrist)   Fine Motor Coordination Picking up coins;Manipulation of small objects    Manipulation of small objects Pt working on completing 6 metal puzzles using bilateral coordination and fine motor precision skills. Increased time for completion.    Picking up coins Pt picking up coins off of tabletop then reaching to shoulder height to place into slotted container. Pt using vision and working on feeling when coin was at slot on top of container before dropping in.                      OT Short Term Goals - 11/11/21 1403       OT SHORT TERM GOAL #1   Title Pt will be provided with and educated on HEP to improve mobility of BUE required for ADL task completion.    Time 4    Period Weeks    Status Achieved    Target Date 10/14/21      OT SHORT TERM GOAL #2   Title Pt will demonstrate RUE A/ROM WFL to improve ability to reach overhead and behind back during dressing tasks.    Time 4    Period Weeks    Status Partially Met      OT SHORT TERM GOAL #3   Title Pt will demonstrate improved fine motor coordination required for self-feeding by completing 9 hole peg test in under 1' 15" for the left hand and in under 6'  for the right hand.    Baseline 12/13: right hand 1'46", left hand 1' 25" 1/12: right hand 1'34"; left hand 53.58"    Time 4    Period Weeks    Status Achieved      OT SHORT TERM GOAL #4   Title Pt will demonstrate improved grip strength of  at least 35# required for holding items with right hand.    Time 4    Period Weeks               OT Long Term Goals - 11/11/21 1405       OT LONG TERM GOAL #1   Title Pt will increase RUE strength to 4-/5 or greater to improve ability to use RUE as dominant during simple ADLs.    Time 8    Period Weeks    Status Revised      OT LONG TERM GOAL #2   Title Pt will increase right grip strength to 40# or greater and pinch strength to 12# or greater to improve ability to complete functional tasks requiring sustained grip on objects.    Time 8    Period Weeks      OT LONG TERM GOAL #3   Title Pt will increase fine motor coordination required for operating buttons or tying shoes by completing 9 hole peg test in under 50' with left hand and under 1'10" with right hand.    Baseline 12/13: right hand 1' 49", left hand 1' 25"; 1/12: right hand 1'34" left hand 53.58"    Time 8    Period Weeks    Status Revised      OT LONG TERM GOAL #4   Title Pt will be educated on AE available to faciliate independence in ADLs.    Time 8    Period Weeks    Status On-going                   Plan - 11/30/21 1407     Clinical Impression Statement A: Pt reports continued success with handwriting tasks when using built up pens at home. Pt is improving with use RUE during all tasks, lack of sensation is his primary deficit, continues to use vision to compensate. Right shoulder with limited ROM, suspect RC pathology or similar. Continued with fine motor activities targeting in-hand manipulation, fine motor control and precision. Pt working on coin manipulation and incorporating functional reaching. Added fine motor metal puzzles today, increased time for  completion.    Body Structure / Function / Physical Skills ADL;Endurance;UE functional use;Pain;ROM;IADL;Strength;Mobility;Coordination    Plan P: Continue with coordination tasks-combine functional reaching with fine motor work    [place] 11/15: red theraputty exercises; 11/17: supine A/ROM; 11/21: hand A/ROM; 12/13: seated AA/ROM; 1/17: pencil control packet for tracing    Consulted and Agree with Plan of Care Patient    Family Member Consulted Wife             Patient will benefit from skilled therapeutic intervention in order to improve the following deficits and impairments:   Body Structure / Function / Physical Skills: ADL, Endurance, UE functional use, Pain, ROM, IADL, Strength, Mobility, Coordination       Visit Diagnosis: Other symptoms and signs involving the nervous system  Other lack of coordination    Problem List Patient Active Problem List   Diagnosis Date Noted   Cervical myelopathy ([place])    Hyponatremia    Neurogenic bladder    Pressure injury of skin 08/08/2021   Acute incomplete quadriplegia ([place]) 08/01/2021   Epidural hematoma 07/22/2021   Myelopathy ([place]) 07/19/2021   Callus 05/18/2021   Acute bacterial rhinosinusitis 10/05/2020   Cough in adult 10/05/2020   S/P CABG x 3 04/27/2018   Angina pectoris ([place]) 04/24/2018   Obesity    Chronic  combined systolic and diastolic congestive heart failure (HCC)    Peripheral edema 06/30/2017   Pedal edema 11/28/2016   Osteopenia 08/13/2015   Thrombocytopenia ([place]) 05/25/2015   Chronic ITP (idiopathic thrombocytopenia) ([place]) 05/25/2015   Hypothyroidism 08/29/2014   Diverticulitis of colon (without mention of hemorrhage)(562.11) 02/04/2014   Dysphagia, unspecified(787.20) 02/04/2014   Hyperlipidemia 11/18/2013   Dyspnea 01/02/2012   Palpitations 01/02/2012   Mixed hyperlipidemia 07/06/2011   Coronary artery disease involving native coronary artery of native heart with angina pectoris ([place])     TOTAL KNEE FOLLOW-UP 08/14/2008   Chronic back pain 07/23/2008   KNEE, ARTHRITIS, DEGEN./OSTEO 05/29/2008   JOINT EFFUSION, KNEE 04/30/2008   Pain in joint, lower leg 10/10/2007   Essential hypertension 10/09/2007    Guadelupe Sabin, OTR/L  [phone] 11/30/2021, 3:18 PM  [place], [place], [address] Phone: [phone]   Fax:  [phone]  Name: [name] MRN: [id number] Date of Birth: 19-Jan-1951

## 2021-12-02 ENCOUNTER — Encounter (HOSPITAL_COMMUNITY): Payer: Medicare Other | Admitting: Occupational Therapy

## 2021-12-07 ENCOUNTER — Encounter (HOSPITAL_COMMUNITY): Payer: Self-pay | Admitting: Occupational Therapy

## 2021-12-07 ENCOUNTER — Ambulatory Visit (HOSPITAL_COMMUNITY): Payer: Medicare Other | Attending: Registered Nurse | Admitting: Occupational Therapy

## 2021-12-07 ENCOUNTER — Other Ambulatory Visit: Payer: Self-pay

## 2021-12-07 ENCOUNTER — Ambulatory Visit (HOSPITAL_COMMUNITY): Payer: Medicare Other | Admitting: Physical Therapy

## 2021-12-07 VITALS — HR 62

## 2021-12-07 DIAGNOSIS — R29818 Other symptoms and signs involving the nervous system: Secondary | ICD-10-CM | POA: Diagnosis not present

## 2021-12-07 DIAGNOSIS — R278 Other lack of coordination: Secondary | ICD-10-CM | POA: Diagnosis not present

## 2021-12-07 DIAGNOSIS — R262 Difficulty in walking, not elsewhere classified: Secondary | ICD-10-CM

## 2021-12-07 DIAGNOSIS — M6281 Muscle weakness (generalized): Secondary | ICD-10-CM

## 2021-12-07 DIAGNOSIS — R2689 Other abnormalities of gait and mobility: Secondary | ICD-10-CM | POA: Diagnosis not present

## 2021-12-07 DIAGNOSIS — R2681 Unsteadiness on feet: Secondary | ICD-10-CM | POA: Insufficient documentation

## 2021-12-07 NOTE — Therapy (Signed)
[facility] [address] [place], [place], [address] Phone: [phone]   Fax:  [phone]  Occupational Therapy Treatment  Patient Details  Name: [name] MRN: [id number] Date of Birth: [birth year] Referring Provider (OT): Danella Sensing, NP   Encounter Date: 12/07/2021   OT End of Session - 12/07/21 1552     Visit Number 18    Number of Visits 20    Date for OT Re-Evaluation 12/11/21    Authorization Type [employer] Medicare; $30 copay per discipline per visit    Authorization Time Period no visit limit    Progress Note Due on Visit 78    OT Start Time 1515    OT Stop Time 1555    OT Time Calculation (min) 40 min    Activity Tolerance Patient tolerated treatment well    Behavior During Therapy [facility] for tasks assessed/performed             Past Medical History:  Diagnosis Date   Allergy    Carpal tunnel syndrome    Chronic back pain    Chronic combined systolic and diastolic congestive heart failure (HCC)    Chronic ITP (idiopathic thrombocytopenia) ([place]) 05/25/2015   Coronary artery disease    Coronary artery disease involving native coronary artery of native heart with angina pectoris (HCC)    Degenerative disc disease    GERD (gastroesophageal reflux disease)    History of thrombocytopenia    Hypercholesteremia    Hypertension    Hypothyroid    Lumbar pain    Myocardial infarction ([place]) 2009   Obesity    Pneumonia    around age 62   S/P CABG x 3 04/27/2018   LIMA to LAD SVG to OM1 SVG to OM2   Shortness of breath dyspnea     Past Surgical History:  Procedure Laterality Date   ANTERIOR CERVICAL DECOMP/DISCECTOMY FUSION N/A 07/19/2021   Procedure: CERVICAL THREE-FOUR ANTERIOR CERVICAL DECOMPRESSION/DISCECTOMY FUSION;  Surgeon: [name], MD;  Location: [facility];  Service: Neurosurgery;  Laterality: N/A;   ANTERIOR CERVICAL DECOMP/DISCECTOMY FUSION N/A 07/22/2021   Procedure: ANTERIOR CERVICAL DECOMPRESSION/DISCECTOMY  REVISON FUSION  WITH EVACUATION OF EPIDURAL HEMATOMA;  Surgeon: [name], DO;  Location: [facility];  Service: Neurosurgery;  Laterality: N/A;   BACK SURGERY     5 lumbas disc with cervical and lumbar fusions   BIOPSY N/A 03/06/2014   Procedure: ESOPHAGEAL BIOPSIES;  Surgeon: [name], MD;  [facility];  Service: Endoscopy;  Laterality: N/A;   COLONOSCOPY     COLONOSCOPY WITH PROPOFOL N/A 03/06/2014   Procedure: COLONOSCOPY WITH PROPOFOL;  Surgeon: [name], MD;  [facility];  Service: Endoscopy;  Laterality: N/A;  in cecum at 0807; total withdrawal time 9 minutes   CORONARY ANGIOPLASTY WITH STENT PLACEMENT     2000, and 2004 has 3 stents   CORONARY ARTERY BYPASS GRAFT N/A 04/27/2018   Procedure: CORONARY ARTERY BYPASS GRAFTING (CABG) x Three , using left internal mammary artery and right leg greater saphenous vein;  Surgeon: [name], MD;  Location: [facility];  Service: Open Heart Surgery;  Laterality: N/A;   ESOPHAGOGASTRODUODENOSCOPY (EGD) WITH PROPOFOL N/A 03/06/2014   Procedure: ESOPHAGOGASTRODUODENOSCOPY (EGD) WITH PROPOFOL;  Surgeon: [name], MD;  [facility];  Service: Endoscopy;  Laterality: N/A;   LEFT HEART CATH AND CORONARY ANGIOGRAPHY N/A 04/24/2018   Procedure: LEFT HEART CATH AND CORONARY ANGIOGRAPHY;  Surgeon: [name], MD;  Location: [facility];  Service: Cardiovascular;  Laterality: N/A;   LUMBAR FUSION  2009   MALONEY DILATION N/A 03/06/2014   Procedure: MALONEY DILATION 54 french;  Surgeon: [name], MD;  [facility];  Service: Endoscopy;  Laterality: N/A;   NECK SURGERY     TEE WITHOUT CARDIOVERSION N/A 04/27/2018   Procedure: TRANSESOPHAGEAL ECHOCARDIOGRAM (TEE);  Surgeon: [name], MD;  Location: [facility];  Service: Open Heart Surgery;  Laterality: N/A;    There were no vitals filed for this visit.   Subjective Assessment - 12/07/21 1510     Subjective  S: Sometimes my hands seem more numb than  other times.    Currently in Pain? Yes    Pain Score 3     Pain Location Knee    Pain Orientation Right;Left    Pain Descriptors / Indicators Aching;Sore    Pain Type Chronic pain    Pain Radiating Towards N/A    Pain Onset In the past 7 days    Pain Frequency Constant    Aggravating Factors  weather    Pain Relieving Factors rest    Effect of Pain on Daily Activities min effect on ADLs    Multiple Pain Sites No                [facility] OT Assessment - 12/07/21 1509       Assessment   Medical Diagnosis Acute incomplete quadriplegia      Precautions   Precautions Cervical;Fall    Required Braces or Orthoses Cervical Brace    Cervical Brace Soft collar;For comfort                      OT Treatments/Exercises (OP) - 12/07/21 1509       Exercises   Exercises Hand      Fine Motor Coordination (Hand/Wrist)   Fine Motor Coordination Paperclip maze    Paperclip Maze Pt working on putting 8 large and 2 small paperclips together, then disassembling. Min difficulty with paperclip maze.    Manipulation of small objects Pt working on stacking beads 2 high, using tweezers to grasp and stack. Pt using slow and controlled fine motor movements for task, propping forearm on table for stability during activity. Minimal dropping of beads. Once finished pt sliding tweezers into stacks of beads and picking up the entire stack.    Grooved pegs Pt working on placing pegs into grooved pegboard using tweezers. Significantly increased time required, pt placing 6 pegs today.                      OT Short Term Goals - 11/11/21 1403       OT SHORT TERM GOAL #1   Title Pt will be provided with and educated on HEP to improve mobility of BUE required for ADL task completion.    Time 4    Period Weeks    Status Achieved    Target Date 10/14/21      OT SHORT TERM GOAL #2   Title Pt will demonstrate RUE A/ROM WFL to improve ability to reach overhead and behind back during  dressing tasks.    Time 4    Period Weeks    Status Partially Met      OT SHORT TERM GOAL #3   Title Pt will demonstrate improved fine motor coordination required for self-feeding by completing 9 hole peg test in under _0  for  the left hand and in under 6' for the right hand.    Baseline 12/13: right hand 1'46", left hand _0  1/12: right hand 1'34"; left hand 53.58"    Time 4    Period Weeks    Status Achieved      OT SHORT TERM GOAL #4   Title Pt will demonstrate improved grip strength of at least 35# required for holding items with right hand.    Time 4    Period Weeks               OT Long Term Goals - 11/11/21 1405       OT LONG TERM GOAL #1   Title Pt will increase RUE strength to 4-/5 or greater to improve ability to use RUE as dominant during simple ADLs.    Time 8    Period Weeks    Status Revised      OT LONG TERM GOAL #2   Title Pt will increase right grip strength to 40# or greater and pinch strength to 12# or greater to improve ability to complete functional tasks requiring sustained grip on objects.    Time 8    Period Weeks      OT LONG TERM GOAL #3   Title Pt will increase fine motor coordination required for operating buttons or tying shoes by completing 9 hole peg test in under 50' with left hand and under 1'10" with right hand.    Baseline 12/13: right hand _1 , left hand _2 ; 1/12: right hand 1'34" left hand 53.58"    Time 8    Period Weeks    Status Revised      OT LONG TERM GOAL #4   Title Pt will be educated on AE available to faciliate independence in ADLs.    Time 8    Period Weeks    Status On-going                   Plan - 12/07/21 1538     Clinical Impression Statement A: Continued with fine motor work today, pt using tweezers for 2 tasks, successfully placing grooved pegs into pegboard today which he has been unable to do in the past. Also completed paperclip maze today, occasional using to use right hand versus  left hand to assemble or disassemble. Increased time for all tasks. Verbal cuing for form and strategies.    Body Structure / Function / Physical Skills ADL;Endurance;UE functional use;Pain;ROM;IADL;Strength;Mobility;Coordination    Plan P: Reassessment, discharge with updated HEP    OT Home Exercise Plan 11/15: red theraputty exercises; 11/17: supine A/ROM; 11/21: hand A/ROM; 12/13: seated AA/ROM; 1/17: pencil control packet for tracing    Consulted and Agree with Plan of Care Patient             Patient will benefit from skilled therapeutic intervention in order to improve the following deficits and impairments:   Body Structure / Function / Physical Skills: ADL, Endurance, UE functional use, Pain, ROM, IADL, Strength, Mobility, Coordination       Visit Diagnosis: Other symptoms and signs involving the nervous system  Other lack of coordination    Problem List Patient Active Problem List   Diagnosis Date Noted   Cervical myelopathy ([place])    Hyponatremia    Neurogenic bladder    Pressure injury of skin 08/08/2021   Acute incomplete quadriplegia ([place]) 08/01/2021   Epidural hematoma 07/22/2021   Myelopathy ([place]) 07/19/2021   Callus  05/18/2021   Acute bacterial rhinosinusitis 10/05/2020   Cough in adult 10/05/2020   S/P CABG x 3 04/27/2018   Angina pectoris ([place]) 04/24/2018   Obesity    Chronic combined systolic and diastolic congestive heart failure ([place])    Peripheral edema 06/30/2017   Pedal edema 11/28/2016   Osteopenia 08/13/2015   Thrombocytopenia ([place]) 05/25/2015   Chronic ITP (idiopathic thrombocytopenia) ([place]) 05/25/2015   Hypothyroidism 08/29/2014   Diverticulitis of colon (without mention of hemorrhage)(562.11) 02/04/2014   Dysphagia, unspecified(787.20) 02/04/2014   Hyperlipidemia 11/18/2013   Dyspnea 01/02/2012   Palpitations 01/02/2012   Mixed hyperlipidemia 07/06/2011   Coronary artery disease involving native coronary artery of native heart with  angina pectoris ([place])    TOTAL KNEE FOLLOW-UP 08/14/2008   Chronic back pain 07/23/2008   KNEE, ARTHRITIS, DEGEN./OSTEO 05/29/2008   JOINT EFFUSION, KNEE 04/30/2008   Pain in joint, lower leg 10/10/2007   Essential hypertension 10/09/2007    Guadelupe Sabin, OTR/L  [phone] 12/07/2021, 3:56 PM  [place], [place], [address] Phone: [phone]   Fax:  [phone]  Name: TAVYN KURKA MRN: [id number] Date of Birth: Dec 03, 1950

## 2021-12-07 NOTE — Therapy (Signed)
[facility] [address] [place], [place], [address] Phone: [phone]   Fax:  [phone]  Physical Therapy Treatment  Patient Details  Name: [name] MRN: [id number] Date of Birth: 01/30/1951 Referring Provider (PT): [name], NP   Encounter Date: 12/07/2021   PT End of Session - 12/07/21 1442     Visit Number 14    Number of Visits 24    Date for PT Re-Evaluation 01/04/22    Authorization Type [employer] Medicare    Progress Note Due on Visit 18    PT Start Time 1439    PT Stop Time 1513    PT Time Calculation (min) 34 min    Equipment Utilized During Treatment Gait belt    Activity Tolerance Patient tolerated treatment well    Behavior During Therapy WFL for tasks assessed/performed             Past Medical History:  Diagnosis Date   Allergy    Carpal tunnel syndrome    Chronic back pain    Chronic combined systolic and diastolic congestive heart failure (HCC)    Chronic ITP (idiopathic thrombocytopenia) ([place]) 05/25/2015   Coronary artery disease    Coronary artery disease involving native coronary artery of native heart with angina pectoris ([place])    Degenerative disc disease    GERD (gastroesophageal reflux disease)    History of thrombocytopenia    Hypercholesteremia    Hypertension    Hypothyroid    Lumbar pain    Myocardial infarction ([place]) 2009   Obesity    Pneumonia    around age 39   S/P CABG x 3 04/27/2018   LIMA to LAD SVG to OM1 SVG to OM2   Shortness of breath dyspnea     Past Surgical History:  Procedure Laterality Date   ANTERIOR CERVICAL DECOMP/DISCECTOMY FUSION N/A 07/19/2021   Procedure: CERVICAL THREE-FOUR ANTERIOR CERVICAL DECOMPRESSION/DISCECTOMY FUSION;  Surgeon: [name], MD;  Location: [facility];  Service: Neurosurgery;  Laterality: N/A;   ANTERIOR CERVICAL DECOMP/DISCECTOMY FUSION N/A 07/22/2021   Procedure: ANTERIOR CERVICAL DECOMPRESSION/DISCECTOMY REVISON FUSION  WITH EVACUATION  OF EPIDURAL HEMATOMA;  Surgeon: [name], DO;  Location: [facility];  Service: Neurosurgery;  Laterality: N/A;   BACK SURGERY     5 lumbas disc with cervical and lumbar fusions   BIOPSY N/A 03/06/2014   Procedure: ESOPHAGEAL BIOPSIES;  Surgeon: [name], MD;  [facility];  Service: Endoscopy;  Laterality: N/A;   COLONOSCOPY     COLONOSCOPY WITH PROPOFOL N/A 03/06/2014   Procedure: COLONOSCOPY WITH PROPOFOL;  Surgeon: [name], MD;  [facility]: [facility];  Service: Endoscopy;  Laterality: N/A;  in cecum at 0807; total withdrawal time 9 minutes   CORONARY ANGIOPLASTY WITH STENT PLACEMENT     2000, and 2004 has 3 stents   CORONARY ARTERY BYPASS GRAFT N/A 04/27/2018   Procedure: CORONARY ARTERY BYPASS GRAFTING (CABG) x Three , using left internal mammary artery and right leg greater saphenous vein;  Surgeon: [name], MD;  Location: [facility];  Service: Open Heart Surgery;  Laterality: N/A;   ESOPHAGOGASTRODUODENOSCOPY (EGD) WITH PROPOFOL N/A 03/06/2014   Procedure: ESOPHAGOGASTRODUODENOSCOPY (EGD) WITH PROPOFOL;  Surgeon: [name], MD;  [facility];  Service: Endoscopy;  Laterality: N/A;   LEFT HEART CATH AND CORONARY ANGIOGRAPHY N/A 04/24/2018   Procedure: LEFT HEART CATH AND CORONARY ANGIOGRAPHY;  Surgeon: [name], MD;  Location: [facility];  Service: Cardiovascular;  Laterality: N/A;   LUMBAR FUSION  2009   MALONEY DILATION N/A 03/06/2014   Procedure: MALONEY DILATION 54 french;  Surgeon: [name], MD;  [facility];  Service: Endoscopy;  Laterality: N/A;   NECK SURGERY     TEE WITHOUT CARDIOVERSION N/A 04/27/2018   Procedure: TRANSESOPHAGEAL ECHOCARDIOGRAM (TEE);  Surgeon: [name], MD;  Location: [facility];  Service: Open Heart Surgery;  Laterality: N/A;    Vitals:   12/07/21 1501 12/07/21 1506 12/07/21 1512  Pulse: (!) 59 64 62  SpO2: 95% 97% 98%     Subjective Assessment - 12/07/21 1440     Subjective Patient reports that he  is feeling good apart from his knees which are aching today.    Pertinent History 71 y/o male who underwent C3/4 ACDF on 07/19/21 then presented back to the ED on 07/20/21 with cervical stenosis with right greater than left weakness due to C3-4 epidural hematoma; cervical myelopathy. Pt underwent C3/4 revision and evacuation of hematoma, Placement of intervertebral biomechanical device C3-4, globus interbody, and Placement of anterior instrumentation consisting of interbody plate and screws    Patient Stated Goals Be able to walk instead of using w/c; 12/13: ability to get up from floor    Currently in Pain? Yes    Pain Score 3     Pain Location Knee    Pain Orientation Right;Left    Pain Descriptors / Indicators Aching;Sore                               [facility] Adult PT Treatment/Exercise - 12/07/21 0001       Neuro Re-ed    Neuro Re-ed Details  Cable walking #2 1x2RT for each direction w/ 3lbs AWs      Knee/Hip Exercises: Aerobic   Nustep x60mn                       PT Short Term Goals - 11/23/21 1524       PT SHORT TERM GOAL #1   Title Patient will be able to complete 5x STS in under 15 seconds in order to reduce the risk of falls.    Baseline 20 sec with BUE support, increased time without UE support, 18" seat height    Time 3    Period Weeks    Status On-going    Target Date 11/23/21      PT SHORT TERM GOAL #2   Title Decrease risk for falls per TUG test time 12 sec with least restrictive AD    Baseline 16 sec with RW    Time 3    Period Weeks    Status On-going    Target Date 11/23/21      PT SHORT TERM GOAL #3   Title Patient will report at least 25% improvement in symptoms for improved quality of life.    Baseline 25% improved    Time 3    Period Weeks    Status Achieved    Target Date 10/20/21      PT SHORT TERM GOAL #4   Title Negotiate curb and ascend/descend 12 stairs with supervision to improve safety with mobility     Baseline CGA with RW for curb, CGA with BHR stair ambulation x 8 stairs    Time 2    Period Weeks    Status On-going    Target Date 11/23/21  PT Long Term Goals - 11/23/21 1524       PT LONG TERM GOAL #1   Title Demo 4/5 BLE strength to improve safety and stability for standing ADL and gait    Baseline 4- to 4/5 gross BLE    Time 6    Period Weeks    Status Partially Met    Target Date 12/07/21      PT LONG TERM GOAL #2   Title Demo modified independent ambulation x 350 ft to improve functional ambulation    Baseline 400 ft with supervision with RW    Time 6    Period Weeks    Status On-going    Target Date 12/07/21      PT LONG TERM GOAL #3   Title Ascend/descend curb with least restrictive AD to improve safety with ambulation    Baseline CGA with RW    Time 6    Period Weeks    Status On-going    Target Date 12/07/21                   Plan - 12/07/21 1526     Clinical Impression Statement Patient generally tolerated exercise well during today's session, but was allowed additional rest time due to O2sat levels in light of cardiac history. Patient was able to complete both backward and forward cable walking without assistive device, but a rolling table was used for stability during side/side walking with both directions. There was no LOB which the patient was not able to correct by himself during this session although he was being closely guarded. O2sat was allowed to return to 98% before handing the patient off to OT services.    Personal Factors and Comorbidities Age;Comorbidity 1;Time since onset of injury/illness/exacerbation    Comorbidities hx of multiple spine surgeries    Examination-Activity Limitations Bathing;Bend;Carry;Lift;Toileting;Stand;Stairs;Squat;Reach Overhead;Locomotion Level;Transfers    Examination-Participation Restrictions Cleaning;Yard Work;Meal Prep;Driving;Community Activity    Stability/Clinical Decision Making  Evolving/Moderate complexity    Rehab Potential Good    PT Frequency 2x / week    PT Duration 6 weeks    PT Treatment/Interventions ADLs/Self Care Home Management;Electrical Stimulation;DME Instruction;Gait training;Stair training;Functional mobility training;Therapeutic activities;Therapeutic exercise;Balance training;Patient/family education;Neuromuscular re-education;Wheelchair mobility training;Manual techniques;Dry needling    PT Next Visit Plan Progress gait, balance and functional hip strengthening. Step ups, possibly power tower    PT Home Exercise Plan SLR; 12/13: bridge and clam; 12/15: chair push up; 12/20 sit to stand; supine hip abduction 12/22 band clamshell, laq    Consulted and Agree with Plan of Care Patient             Patient will benefit from skilled therapeutic intervention in order to improve the following deficits and impairments:  Abnormal gait, Decreased activity tolerance, Decreased balance, Decreased mobility, Decreased knowledge of use of DME, Decreased endurance, Decreased coordination, Decreased strength, Difficulty walking, Postural dysfunction, Improper body mechanics, Pain  Visit Diagnosis: Difficulty in walking, not elsewhere classified  Other lack of coordination  Muscle weakness (generalized)  Unsteadiness on feet  Other abnormalities of gait and mobility  Other symptoms and signs involving the nervous system     Problem List Patient Active Problem List   Diagnosis Date Noted   Cervical myelopathy (HCC)    Hyponatremia    Neurogenic bladder    Pressure injury of skin 08/08/2021   Acute incomplete quadriplegia ([facility]) 08/01/2021   Epidural hematoma 07/22/2021   Myelopathy ([facility]) 07/19/2021   Callus 05/18/2021   Acute bacterial rhinosinusitis  10/05/2020   Cough in adult 10/05/2020   S/P CABG x 3 04/27/2018   Angina pectoris ([place]) 04/24/2018   Obesity    Chronic combined systolic and diastolic congestive heart failure ([place])    Peripheral  edema 06/30/2017   Pedal edema 11/28/2016   Osteopenia 08/13/2015   Thrombocytopenia ([facility]) 05/25/2015   Chronic ITP (idiopathic thrombocytopenia) ([facility]) 05/25/2015   Hypothyroidism 08/29/2014   Diverticulitis of colon (without mention of hemorrhage)(562.11) 02/04/2014   Dysphagia, unspecified(787.20) 02/04/2014   Hyperlipidemia 11/18/2013   Dyspnea 01/02/2012   Palpitations 01/02/2012   Mixed hyperlipidemia 07/06/2011   Coronary artery disease involving native coronary artery of native heart with angina pectoris ([place])    TOTAL KNEE FOLLOW-UP 08/14/2008   Chronic back pain 07/23/2008   KNEE, ARTHRITIS, DEGEN./OSTEO 05/29/2008   JOINT EFFUSION, KNEE 04/30/2008   Pain in joint, lower leg 10/10/2007   Essential hypertension 10/09/2007    Adalberto Cole, PT 12/07/2021, 3:30 PM  [facility] [place], [place], [address] Phone: [phone]   Fax:  [phone]  Name: KIRBY CORTESE MRN: [id number] Date of Birth: 1951-09-26

## 2021-12-09 ENCOUNTER — Encounter (HOSPITAL_COMMUNITY): Payer: Medicare Other

## 2021-12-09 ENCOUNTER — Ambulatory Visit (HOSPITAL_COMMUNITY): Payer: Medicare Other | Admitting: Occupational Therapy

## 2021-12-10 ENCOUNTER — Encounter: Payer: Self-pay | Admitting: Physical Medicine and Rehabilitation

## 2021-12-10 ENCOUNTER — Encounter: Payer: Medicare Other | Attending: Registered Nurse | Admitting: Physical Medicine and Rehabilitation

## 2021-12-10 ENCOUNTER — Other Ambulatory Visit: Payer: Self-pay

## 2021-12-10 VITALS — BP 186/88 | HR 56 | Temp 98.0°F | Ht 71.0 in | Wt 239.2 lb

## 2021-12-10 DIAGNOSIS — Z993 Dependence on wheelchair: Secondary | ICD-10-CM | POA: Diagnosis not present

## 2021-12-10 DIAGNOSIS — G8250 Quadriplegia, unspecified: Secondary | ICD-10-CM | POA: Diagnosis not present

## 2021-12-10 DIAGNOSIS — M21371 Foot drop, right foot: Secondary | ICD-10-CM | POA: Insufficient documentation

## 2021-12-10 DIAGNOSIS — G959 Disease of spinal cord, unspecified: Secondary | ICD-10-CM | POA: Insufficient documentation

## 2021-12-10 DIAGNOSIS — N319 Neuromuscular dysfunction of bladder, unspecified: Secondary | ICD-10-CM | POA: Insufficient documentation

## 2021-12-10 DIAGNOSIS — R252 Cramp and spasm: Secondary | ICD-10-CM | POA: Insufficient documentation

## 2021-12-10 DIAGNOSIS — M792 Neuralgia and neuritis, unspecified: Secondary | ICD-10-CM | POA: Diagnosis not present

## 2021-12-10 DIAGNOSIS — M21372 Foot drop, left foot: Secondary | ICD-10-CM | POA: Diagnosis not present

## 2021-12-10 MED ORDER — GABAPENTIN 600 MG PO TABS: 600.0000 mg | ORAL_TABLET | Freq: Three times a day (TID) | ORAL | 5 refills | Status: DC

## 2021-12-10 MED ORDER — BACLOFEN 10 MG PO TABS: 5.0000 mg | ORAL_TABLET | Freq: Three times a day (TID) | ORAL | 5 refills | Status: DC

## 2021-12-10 NOTE — Patient Instructions (Addendum)
Pt is a 71 yr old male with hx of cervical myelopathy s/p epidural hematoma after ACDF 07/19/21. Also has CAD, CABG/2019; BPH with urinary retention; thrombocytopenia B/L foot drop   Here for f/u on quadriplegia. With new onset spasticity.    Try Timed voiding- q2-3 hours- no matter what- go to bathroom.    2.  When starts with clonus- in feet- point toes up then push down!   3. Get BP better controlled. Talk to PCP!   4.  Spasticity- stop Robaxin/Methocarbemol- doesn't help spasticity- can get worse for 1-2 years- if it gets worse, call ME!!!!  5. Start Baclofen 5 mg 3x/day- x 1-2 weeks- IF needs to increase/still really stiff, etc, can increase to 10 mg 3x/day- has to be 3x/day- first dose; 1st hour- watch him! Sent in 6 months supply.   6. If night time spasticity not enough, call me and can change Baclofen dosing at night.  7. Con't Gabapentin 600 mg BID- but change to 600 mg tabs- sent in 6 months supply  8. Gets new w/c next week- is red   9. F?/U in months- double appt.

## 2021-12-10 NOTE — Progress Notes (Signed)
Subjective:    Patient ID: [name], male    DOB: 1950/11/29, 71 y.o.   MRN: [id number]  HPI Pt is a 71 yr old male with hx of cervical myelopathy s/p epidural hematoma after ACDF 07/19/21. Also has CAD, CABG/2019; BPH with urinary retention; thrombocytopenia   Here for f/u on quadriplegia.    Bladder- wearing depends- peeing well- when needs to go- needs to go instantly- and cannot get to bathroom fast enough- beats wearing catheter all the time.      Taking own [place]' ed some with therapy-  Initially leaps and bounds and now just exercising.  Hands are running behind legs.  Hands are still real numb, but can now open drinks, remotes, use phone, feed self; using ipad;  Gets on RW- likes rollator better- at PT- did 970 ft at once time.  Still uses w/c if gets tired when goes out- .  Is wobbly- only 10 steps without RW.    Done with Lovenox.    Pain- not taking any pain meds- tylenol 2-4 tylenol /night- but not during day. No sleeping pills When gets still at night, pain more noticeable.  Gabapentin helps stinging and burning in hands.    Goes to have BM same time every day- not using suppositories!- Stopped 2 months ago.      Spasticity-  Having some spasticity- gets in the way of moving- R shoulder cramping and feet B/L clonus- worse when legs get tired- worse at the end of the day.     Pain Inventory Average Pain 3 Pain Right Now 3 My pain is sharp  In the last 24 hours, has pain interfered with the following? General activity 0 Relation with others 0 Enjoyment of life 0 What TIME of day is your pain at its worst? night Sleep (in general) Fair  Pain is worse with: some activites Pain improves with: rest Relief from Meds:  n/a  Family History  Problem Relation Age of Onset   Heart attack Father    Heart disease Father    Colon cancer Maternal Grandmother    Esophageal cancer Neg Hx    Rectal cancer Neg Hx    Stomach cancer Neg Hx    Social  History   Socioeconomic History   Marital status: Married    Spouse name: Not on file   Number of children: Not on file   Years of education: Not on file   Highest education level: Not on file  Occupational History   Occupation: retireed  Tobacco Use   Smoking status: Former    Packs/day: 2.00    Years: 20.00    Pack years: 40.00    Types: Cigarettes    Quit date: 07/06/1991    Years since quitting: 30.4   Smokeless tobacco: Never  Vaping Use   Vaping Use: Never used  Substance and Sexual Activity   Alcohol use: Yes    Comment: occassional   Drug use: No   Sexual activity: Not Currently    Birth control/protection: None  Other Topics Concern   Not on file  Social History Narrative   Disabled due to chronic back pain   Social Determinants of Health   Financial Resource Strain: Not on file  Food Insecurity: Not on file  Transportation Needs: Not on file  Physical Activity: Not on file  Stress: Not on file  Social Connections: Not on file   Past Surgical History:  Procedure Laterality Date   ANTERIOR CERVICAL  DECOMP/DISCECTOMY FUSION N/A 07/19/2021   Procedure: CERVICAL THREE-FOUR ANTERIOR CERVICAL DECOMPRESSION/DISCECTOMY FUSION;  Surgeon: [name], MD;  Location: [facility];  Service: Neurosurgery;  Laterality: N/A;   ANTERIOR CERVICAL DECOMP/DISCECTOMY FUSION N/A 07/22/2021   Procedure: ANTERIOR CERVICAL DECOMPRESSION/DISCECTOMY REVISON FUSION  WITH EVACUATION OF EPIDURAL HEMATOMA;  Surgeon: [name], DO;  Location: [facility];  Service: Neurosurgery;  Laterality: N/A;   BACK SURGERY     5 lumbas disc with cervical and lumbar fusions   BIOPSY N/A 03/06/2014   Procedure: ESOPHAGEAL BIOPSIES;  Surgeon: [name], MD;  [facility];  Service: Endoscopy;  Laterality: N/A;   COLONOSCOPY     COLONOSCOPY WITH PROPOFOL N/A 03/06/2014   Procedure: COLONOSCOPY WITH PROPOFOL;  Surgeon: [name], MD;  [facility];  Service: Endoscopy;  Laterality: N/A;  in cecum  at 0807; total withdrawal time 9 minutes   CORONARY ANGIOPLASTY WITH STENT PLACEMENT     2000, and 2004 has 3 stents   CORONARY ARTERY BYPASS GRAFT N/A 04/27/2018   Procedure: CORONARY ARTERY BYPASS GRAFTING (CABG) x Three , using left internal mammary artery and right leg greater saphenous vein;  Surgeon: [name], MD;  Location: [facility];  Service: Open Heart Surgery;  Laterality: N/A;   ESOPHAGOGASTRODUODENOSCOPY (EGD) WITH PROPOFOL N/A 03/06/2014   Procedure: ESOPHAGOGASTRODUODENOSCOPY (EGD) WITH PROPOFOL;  Surgeon: [name], MD;  [facility];  Service: Endoscopy;  Laterality: N/A;   LEFT HEART CATH AND CORONARY ANGIOGRAPHY N/A 04/24/2018   Procedure: LEFT HEART CATH AND CORONARY ANGIOGRAPHY;  Surgeon: [name], MD;  Location: [facility];  Service: Cardiovascular;  Laterality: N/A;   LUMBAR FUSION  2009   MALONEY DILATION N/A 03/06/2014   Procedure: MALONEY DILATION 54 french;  Surgeon: [name], MD;  [facility];  Service: Endoscopy;  Laterality: N/A;   NECK SURGERY     TEE WITHOUT CARDIOVERSION N/A 04/27/2018   Procedure: TRANSESOPHAGEAL ECHOCARDIOGRAM (TEE);  Surgeon: [name], MD;  Location: [facility];  Service: Open Heart Surgery;  Laterality: N/A;   Past Surgical History:  Procedure Laterality Date   ANTERIOR CERVICAL DECOMP/DISCECTOMY FUSION N/A 07/19/2021   Procedure: CERVICAL THREE-FOUR ANTERIOR CERVICAL DECOMPRESSION/DISCECTOMY FUSION;  Surgeon: [name], MD;  Location: [facility];  Service: Neurosurgery;  Laterality: N/A;   ANTERIOR CERVICAL DECOMP/DISCECTOMY FUSION N/A 07/22/2021   Procedure: ANTERIOR CERVICAL DECOMPRESSION/DISCECTOMY REVISON FUSION  WITH EVACUATION OF EPIDURAL HEMATOMA;  Surgeon: [name], DO;  Location: [facility];  Service: Neurosurgery;  Laterality: N/A;   BACK SURGERY     5 lumbas disc with cervical and lumbar fusions   BIOPSY N/A 03/06/2014   Procedure: ESOPHAGEAL BIOPSIES;  Surgeon: [name], MD;   [facility];  Service: Endoscopy;  Laterality: N/A;   COLONOSCOPY     COLONOSCOPY WITH PROPOFOL N/A 03/06/2014   Procedure: COLONOSCOPY WITH PROPOFOL;  Surgeon: [name], MD;  [facility];  Service: Endoscopy;  Laterality: N/A;  in cecum at 0807; total withdrawal time 9 minutes   CORONARY ANGIOPLASTY WITH STENT PLACEMENT     2000, and 2004 has 3 stents   CORONARY ARTERY BYPASS GRAFT N/A 04/27/2018   Procedure: CORONARY ARTERY BYPASS GRAFTING (CABG) x Three , using left internal mammary artery and right leg greater saphenous vein;  Surgeon: [name], MD;  Location: [facility];  Service: Open Heart Surgery;  Laterality: N/A;   ESOPHAGOGASTRODUODENOSCOPY (EGD) WITH PROPOFOL N/A 03/06/2014   Procedure:  ESOPHAGOGASTRODUODENOSCOPY (EGD) WITH PROPOFOL;  Surgeon: [name], MD;  [facility];  Service: Endoscopy;  Laterality: N/A;   LEFT HEART CATH AND CORONARY ANGIOGRAPHY N/A 04/24/2018   Procedure: LEFT HEART CATH AND CORONARY ANGIOGRAPHY;  Surgeon: [name], MD;  Location: [facility];  Service: Cardiovascular;  Laterality: N/A;   LUMBAR FUSION  2009   MALONEY DILATION N/A 03/06/2014   Procedure: MALONEY DILATION 54 french;  Surgeon: [name], MD;  [facility];  Service: Endoscopy;  Laterality: N/A;   NECK SURGERY     TEE WITHOUT CARDIOVERSION N/A 04/27/2018   Procedure: TRANSESOPHAGEAL ECHOCARDIOGRAM (TEE);  Surgeon: [name], MD;  Location: [facility];  Service: Open Heart Surgery;  Laterality: N/A;   Past Medical History:  Diagnosis Date   Allergy    Carpal tunnel syndrome    Chronic back pain    Chronic combined systolic and diastolic congestive heart failure (HCC)    Chronic ITP (idiopathic thrombocytopenia) (HCC) 05/25/2015   Coronary artery disease    Coronary artery disease involving native coronary artery of native heart with angina pectoris (HCC)    Degenerative disc disease    GERD (gastroesophageal reflux disease)    History  of thrombocytopenia    Hypercholesteremia    Hypertension    Hypothyroid    Lumbar pain    Myocardial infarction ([place]) 2009   Obesity    Pneumonia    around age 48   S/P CABG x 3 04/27/2018   LIMA to LAD SVG to OM1 SVG to OM2   Shortness of breath dyspnea    BP (!) 186/88 Comment: just stood up to weigh on scale   Pulse (!) 56    Temp 98 F (36.7 C)    Ht 5\' 11"  (1.803 m)    Wt 239 lb 3.2 oz (108.5 kg)    SpO2 96%    BMI 33.36 kg/m   Opioid Risk Score:   Fall Risk Score:  `1  Depression screen PHQ 2/9  Depression screen [facility] 2/9 12/10/2021 09/06/2021 04/30/2021 06/11/2018  Decreased Interest 0 0 0 0  Down, Depressed, Hopeless 0 1 0 0  PHQ - 2 Score 0 1 0 0  Altered sleeping - 0 - 0  Tired, decreased energy - 1 - 0  Change in appetite - 0 - 0  Feeling bad or failure about yourself  - 0 - 0  Trouble concentrating - 1 - 0  Moving slowly or fidgety/restless - 0 - 0  Suicidal thoughts - 0 - 0  PHQ-9 Score - 3 - 0  Difficult doing work/chores - Not difficult at all - -  Some recent data might be hidden     Review of Systems  Constitutional: Negative.   HENT: Negative.    Eyes: Negative.   Respiratory: Negative.    Cardiovascular: Negative.   Gastrointestinal: Negative.   Endocrine: Negative.   Genitourinary: Negative.   Musculoskeletal:        Feet and hands  Skin: Negative.   Allergic/Immunologic: Negative.   Neurological: Negative.   Hematological: Negative.   Psychiatric/Behavioral: Negative.    All other systems reviewed and are negative.     Objective:   Physical Exam  Awake, alert, appropriate, in manual w/c- accompanied by wife, NAD  MS:  Deltoids 5-/5; biceps 5-/5; triceps 5/5; WE 4+/5; Grip 4/5; FA 4/5 B/L  LE's: RLE- HF 3+/5; KE 5/5; DF 5-/5 and PF 4-/5 LLE- HF 4+/5; KE 5/5;  DF 5-/5 and PF 1/5    Neuro: No clonus B/L in wrists but has 4-5 beats in ankles B/L  No hoffmans B/L  MAS of 1+ to 2 in knees and hips and ankles B/L       Assessment &  Plan:   Pt is a 71 yr old male with hx of cervical myelopathy s/p epidural hematoma after ACDF 07/19/21. Also has CAD, CABG/2019; BPH with urinary retention; thrombocytopenia B/L foot drop   Here for f/u on quadriplegia. With new onset spasticity.    Try Timed voiding- q2-3 hours- no matter what- go to bathroom.    2.  When starts with clonus- in feet- point toes up then push down!   3. Get BP better controlled. Talk to PCP!   4.  Spasticity- stop Robaxin/Methocarbemol- doesn't help spasticity- can get worse for 1-2 years- if gets worse, call me!!!  5. Start Baclofen 5 mg 3x/day- x 1-2 weeks- IF needs to increase/still really stiff, etc, can increase to 10 mg 3x/day- has to be 3x/day- first dose; 1st hour- watch him! Sent in 6 months supply.   6. If night time spasticity not enough, call me and can change Baclofen dosing at night.  7. Con't Gabapentin 600 mg BID- but change to 600 mg tabs- sent in 6 months supply  8. Gets new w/c next week- is red- [employer].    9. F/U in months- double appt.   I spent a total of  42  minutes on total care today- >50% coordination of care- due to education on spasticity

## 2021-12-14 ENCOUNTER — Telehealth: Payer: Self-pay

## 2021-12-14 ENCOUNTER — Other Ambulatory Visit: Payer: Self-pay

## 2021-12-14 ENCOUNTER — Ambulatory Visit (HOSPITAL_COMMUNITY): Payer: Medicare Other | Admitting: Physical Therapy

## 2021-12-14 DIAGNOSIS — R278 Other lack of coordination: Secondary | ICD-10-CM | POA: Diagnosis not present

## 2021-12-14 DIAGNOSIS — R2689 Other abnormalities of gait and mobility: Secondary | ICD-10-CM | POA: Diagnosis not present

## 2021-12-14 DIAGNOSIS — R2681 Unsteadiness on feet: Secondary | ICD-10-CM | POA: Diagnosis not present

## 2021-12-14 DIAGNOSIS — R29818 Other symptoms and signs involving the nervous system: Secondary | ICD-10-CM | POA: Diagnosis not present

## 2021-12-14 DIAGNOSIS — M6281 Muscle weakness (generalized): Secondary | ICD-10-CM | POA: Diagnosis not present

## 2021-12-14 DIAGNOSIS — R262 Difficulty in walking, not elsewhere classified: Secondary | ICD-10-CM

## 2021-12-14 NOTE — Telephone Encounter (Signed)
[facility] sent over refill request for Metoprolol. Fax sent back informing them to send to PCP Dr. Sallee Lange

## 2021-12-14 NOTE — Therapy (Signed)
[facility] [address] [place], [place], [address] Phone: [phone]   Fax:  [phone]  Physical Therapy Treatment  Patient Details  Name: [name] MRN: [id number] Date of Birth: 12-02-1950 Referring Provider (PT): [name], NP   Encounter Date: 12/14/2021   PT End of Session - 12/14/21 1607     Visit Number 15    Number of Visits 24    Date for PT Re-Evaluation 01/04/22    Authorization Type UHC Medicare    Progress Note Due on Visit 18    PT Start Time 1448    PT Stop Time 1540    PT Time Calculation (min) 52 min    Equipment Utilized During Treatment Gait belt    Activity Tolerance Patient tolerated treatment well    Behavior During Therapy WFL for tasks assessed/performed             Past Medical History:  Diagnosis Date   Allergy    Carpal tunnel syndrome    Chronic back pain    Chronic combined systolic and diastolic congestive heart failure (HCC)    Chronic ITP (idiopathic thrombocytopenia) ([place]) 05/25/2015   Coronary artery disease    Coronary artery disease involving native coronary artery of native heart with angina pectoris ([place])    Degenerative disc disease    GERD (gastroesophageal reflux disease)    History of thrombocytopenia    Hypercholesteremia    Hypertension    Hypothyroid    Lumbar pain    Myocardial infarction ([place]) 2009   Obesity    Pneumonia    around age 38   S/P CABG x 3 04/27/2018   LIMA to LAD SVG to OM1 SVG to OM2   Shortness of breath dyspnea     Past Surgical History:  Procedure Laterality Date   ANTERIOR CERVICAL DECOMP/DISCECTOMY FUSION N/A 07/19/2021   Procedure: CERVICAL THREE-FOUR ANTERIOR CERVICAL DECOMPRESSION/DISCECTOMY FUSION;  Surgeon: [name], MD;  Location: [facility];  Service: Neurosurgery;  Laterality: N/A;   ANTERIOR CERVICAL DECOMP/DISCECTOMY FUSION N/A 07/22/2021   Procedure: ANTERIOR CERVICAL DECOMPRESSION/DISCECTOMY REVISON FUSION  WITH EVACUATION  OF EPIDURAL HEMATOMA;  Surgeon: [name], DO;  Location: [facility];  Service: Neurosurgery;  Laterality: N/A;   BACK SURGERY     5 lumbas disc with cervical and lumbar fusions   BIOPSY N/A 03/06/2014   Procedure: ESOPHAGEAL BIOPSIES;  Surgeon: [name], MD;  [facility]: [facility];  Service: Endoscopy;  Laterality: N/A;   COLONOSCOPY     COLONOSCOPY WITH PROPOFOL N/A 03/06/2014   Procedure: COLONOSCOPY WITH PROPOFOL;  Surgeon: [name], MD;  Location: [facility];  Service: Endoscopy;  Laterality: N/A;  in cecum at 0807; total withdrawal time 9 minutes   CORONARY ANGIOPLASTY WITH STENT PLACEMENT     2000, and 2004 has 3 stents   CORONARY ARTERY BYPASS GRAFT N/A 04/27/2018   Procedure: CORONARY ARTERY BYPASS GRAFTING (CABG) x Three , using left internal mammary artery and right leg greater saphenous vein;  Surgeon: [name], MD;  Location: [facility];  Service: Open Heart Surgery;  Laterality: N/A;   ESOPHAGOGASTRODUODENOSCOPY (EGD) WITH PROPOFOL N/A 03/06/2014   Procedure: ESOPHAGOGASTRODUODENOSCOPY (EGD) WITH PROPOFOL;  Surgeon: [name], MD;  [facility];  Service: Endoscopy;  Laterality: N/A;   LEFT HEART CATH AND CORONARY ANGIOGRAPHY N/A 04/24/2018   Procedure: LEFT HEART CATH AND CORONARY ANGIOGRAPHY;  Surgeon: [name], MD;  Location: [facility];  Service: Cardiovascular;  Laterality: N/A;   LUMBAR FUSION  2009   MALONEY DILATION N/A 03/06/2014   Procedure: MALONEY DILATION 54 french;  Surgeon: [name], MD;  [facility];  Service: Endoscopy;  Laterality: N/A;   NECK SURGERY     TEE WITHOUT CARDIOVERSION N/A 04/27/2018   Procedure: TRANSESOPHAGEAL ECHOCARDIOGRAM (TEE);  Surgeon: [name], MD;  Location: [facility];  Service: Open Heart Surgery;  Laterality: N/A;    There were no vitals filed for this visit.   Subjective Assessment - 12/14/21 1455     Subjective Pt states he went to the dr and they uped his neurotin and gave him something  for his neck spasticity.  Currently without c/o of pain.    Currently in Pain? No/denies                               [facility] Adult PT Treatment/Exercise - 12/14/21 0001       Neuro Re-ed    Neuro Re-ed Details  Cable walking 2plates 2RT for each direction without ankle weights and computer rolly cart      Knee/Hip Exercises: Standing   Knee Flexion Both;2 sets;10 reps    Knee Flexion Limitations 5 lb    Hip Flexion Both;2 sets;10 reps    Hip Flexion Limitations 5lb      Knee/Hip Exercises: Seated   Sit to Sand 10 reps;2 sets                       PT Short Term Goals - 11/23/21 1524       PT SHORT TERM GOAL #1   Title Patient will be able to complete 5x STS in under 15 seconds in order to reduce the risk of falls.    Baseline 20 sec with BUE support, increased time without UE support, 18" seat height    Time 3    Period Weeks    Status On-going    Target Date 11/23/21      PT SHORT TERM GOAL #2   Title Decrease risk for falls per TUG test time 12 sec with least restrictive AD    Baseline 16 sec with RW    Time 3    Period Weeks    Status On-going    Target Date 11/23/21      PT SHORT TERM GOAL #3   Title Patient will report at least 25% improvement in symptoms for improved quality of life.    Baseline 25% improved    Time 3    Period Weeks    Status Achieved    Target Date 10/20/21      PT SHORT TERM GOAL #4   Title Negotiate curb and ascend/descend 12 stairs with supervision to improve safety with mobility    Baseline CGA with RW for curb, CGA with BHR stair ambulation x 8 stairs    Time 2    Period Weeks    Status On-going    Target Date 11/23/21               PT Long Term Goals - 11/23/21 1524       PT LONG TERM GOAL #1   Title Demo 4/5 BLE strength to improve safety and stability for standing ADL and gait    Baseline 4- to 4/5 gross BLE    Time 6    Period Weeks  Status Partially Met    Target Date 12/07/21       PT LONG TERM GOAL #2   Title Demo modified independent ambulation x 350 ft to improve functional ambulation    Baseline 400 ft with supervision with RW    Time 6    Period Weeks    Status On-going    Target Date 12/07/21      PT LONG TERM GOAL #3   Title Ascend/descend curb with least restrictive AD to improve safety with ambulation    Baseline CGA with RW    Time 6    Period Weeks    Status On-going    Target Date 12/07/21                   Plan - 12/14/21 1612     Clinical Impression Statement Continued with LE strengthening and neuromuscular facilitation.    Pt preferred to complete pulley walking activity without ankle weights as it was challenging last visit and activity was completed towards end of session.  PT able to complete with rest between each directional set.  Pt used rolling computer table to UE assist when completing lateral stepping.  Pt reported overall fatigue at end of session but improving and walking better with less cues.  Pt will continue to benefit from skilled PT in order to return to previous functional level.    Personal Factors and Comorbidities Age;Comorbidity 1;Time since onset of injury/illness/exacerbation    Comorbidities hx of multiple spine surgeries    Examination-Activity Limitations Bathing;Bend;Carry;Lift;Toileting;Stand;Stairs;Squat;Reach Overhead;Locomotion Level;Transfers    Examination-Participation Restrictions Cleaning;Yard Work;Meal Prep;Driving;Community Activity    Stability/Clinical Decision Making Evolving/Moderate complexity    Rehab Potential Good    PT Frequency 2x / week    PT Duration 6 weeks    PT Treatment/Interventions ADLs/Self Care Home Management;Electrical Stimulation;DME Instruction;Gait training;Stair training;Functional mobility training;Therapeutic activities;Therapeutic exercise;Balance training;Patient/family education;Neuromuscular re-education;Wheelchair mobility training;Manual techniques;Dry needling     PT Next Visit Plan Progress gait, balance and functional hip strengthening. Step ups, possibly power tower or overweight suspension for gait without AD    PT Home Exercise Plan SLR; 12/13: bridge and clam; 12/15: chair push up; 12/20 sit to stand; supine hip abduction 12/22 band clamshell, laq    Consulted and Agree with Plan of Care Patient             Patient will benefit from skilled therapeutic intervention in order to improve the following deficits and impairments:  Abnormal gait, Decreased activity tolerance, Decreased balance, Decreased mobility, Decreased knowledge of use of DME, Decreased endurance, Decreased coordination, Decreased strength, Difficulty walking, Postural dysfunction, Improper body mechanics, Pain  Visit Diagnosis: Difficulty in walking, not elsewhere classified  Other lack of coordination  Muscle weakness (generalized)     Problem List Patient Active Problem List   Diagnosis Date Noted   Spasticity 12/10/2021   Foot drop, bilateral 12/10/2021   Wheelchair dependence 12/10/2021   Nerve pain 12/10/2021   Cervical myelopathy (HCC)    Hyponatremia    Neurogenic bladder    Pressure injury of skin 08/08/2021   Acute incomplete quadriplegia ([place]) 08/01/2021   Epidural hematoma 07/22/2021   Myelopathy ([place]) 07/19/2021   Callus 05/18/2021   Acute bacterial rhinosinusitis 10/05/2020   Cough in adult 10/05/2020   S/P CABG x 3 04/27/2018   Angina pectoris ([place]) 04/24/2018   Obesity    Chronic combined systolic and diastolic congestive heart failure ([facility])    Peripheral edema 06/30/2017   Pedal edema 11/28/2016  Osteopenia 08/13/2015   Thrombocytopenia ([place]) 05/25/2015   Chronic ITP (idiopathic thrombocytopenia) ([place]) 05/25/2015   Hypothyroidism 08/29/2014   Diverticulitis of colon (without mention of hemorrhage)(562.11) 02/04/2014   Dysphagia, unspecified(787.20) 02/04/2014   Hyperlipidemia 11/18/2013   Dyspnea 01/02/2012   Palpitations  01/02/2012   Mixed hyperlipidemia 07/06/2011   Coronary artery disease involving native coronary artery of native heart with angina pectoris ([place])    TOTAL KNEE FOLLOW-UP 08/14/2008   Chronic back pain 07/23/2008   KNEE, ARTHRITIS, DEGEN./OSTEO 05/29/2008   JOINT EFFUSION, KNEE 04/30/2008   Pain in joint, lower leg 10/10/2007   Essential hypertension 10/09/2007   Teena Irani, PTA/CLT, WTA [phone]  Teena Irani, PTA 12/14/2021, 4:13 PM  [facility] [place], [place], [address] Phone: [phone]   Fax:  [phone]  Name: [name] MRN: [id number] Date of Birth: 08-25-1951

## 2021-12-15 ENCOUNTER — Encounter: Payer: Self-pay | Admitting: Family Medicine

## 2021-12-15 DIAGNOSIS — E038 Other specified hypothyroidism: Secondary | ICD-10-CM

## 2021-12-15 DIAGNOSIS — E7849 Other hyperlipidemia: Secondary | ICD-10-CM

## 2021-12-15 DIAGNOSIS — I5042 Chronic combined systolic (congestive) and diastolic (congestive) heart failure: Secondary | ICD-10-CM

## 2021-12-15 DIAGNOSIS — I25118 Atherosclerotic heart disease of native coronary artery with other forms of angina pectoris: Secondary | ICD-10-CM

## 2021-12-15 DIAGNOSIS — R200 Anesthesia of skin: Secondary | ICD-10-CM

## 2021-12-15 DIAGNOSIS — Z0000 Encounter for general adult medical examination without abnormal findings: Secondary | ICD-10-CM

## 2021-12-15 DIAGNOSIS — I10 Essential (primary) hypertension: Secondary | ICD-10-CM

## 2021-12-15 DIAGNOSIS — D696 Thrombocytopenia, unspecified: Secondary | ICD-10-CM

## 2021-12-15 MED ORDER — POTASSIUM CHLORIDE CRYS ER 10 MEQ PO TBCR: 10.0000 meq | EXTENDED_RELEASE_TABLET | Freq: Every day | ORAL | 0 refills | Status: DC

## 2021-12-15 MED ORDER — PANTOPRAZOLE SODIUM 40 MG PO TBEC: 40.0000 mg | DELAYED_RELEASE_TABLET | Freq: Every day | ORAL | 0 refills | Status: DC

## 2021-12-15 MED ORDER — METOPROLOL TARTRATE 50 MG PO TABS: 50.0000 mg | ORAL_TABLET | Freq: Two times a day (BID) | ORAL | 0 refills | Status: DC

## 2021-12-15 NOTE — Telephone Encounter (Signed)
May have 30 days on each 1 of these  Follow-up next week as planned

## 2021-12-17 ENCOUNTER — Telehealth (HOSPITAL_COMMUNITY): Payer: Self-pay | Admitting: Physical Therapy

## 2021-12-17 ENCOUNTER — Ambulatory Visit (HOSPITAL_COMMUNITY): Payer: Medicare Other | Admitting: Physical Therapy

## 2021-12-17 NOTE — Telephone Encounter (Signed)
Patient no show, left message for patient making him aware of missed appointment and reminded of next.  3:04 PM, 12/17/21 Mearl Latin PT, DPT Physical Therapist at [facility]

## 2021-12-20 ENCOUNTER — Other Ambulatory Visit: Payer: Self-pay | Admitting: *Deleted

## 2021-12-20 MED ORDER — LISINOPRIL 10 MG PO TABS: 10.0000 mg | ORAL_TABLET | Freq: Every day | ORAL | 0 refills | Status: DC

## 2021-12-21 ENCOUNTER — Encounter (HOSPITAL_COMMUNITY): Payer: Medicare Other | Admitting: Physical Therapy

## 2021-12-21 DIAGNOSIS — S14109A Unspecified injury at unspecified level of cervical spinal cord, initial encounter: Secondary | ICD-10-CM | POA: Diagnosis not present

## 2021-12-21 DIAGNOSIS — M5003 Cervical disc disorder with myelopathy, cervicothoracic region: Secondary | ICD-10-CM | POA: Diagnosis not present

## 2021-12-22 ENCOUNTER — Ambulatory Visit: Payer: Medicare Other | Admitting: Family Medicine

## 2021-12-23 ENCOUNTER — Ambulatory Visit (HOSPITAL_COMMUNITY): Payer: Medicare Other | Admitting: Physical Therapy

## 2021-12-23 ENCOUNTER — Encounter (HOSPITAL_COMMUNITY): Payer: Self-pay | Admitting: Physical Therapy

## 2021-12-23 ENCOUNTER — Other Ambulatory Visit: Payer: Self-pay

## 2021-12-23 DIAGNOSIS — R262 Difficulty in walking, not elsewhere classified: Secondary | ICD-10-CM | POA: Diagnosis not present

## 2021-12-23 DIAGNOSIS — R278 Other lack of coordination: Secondary | ICD-10-CM

## 2021-12-23 DIAGNOSIS — M6281 Muscle weakness (generalized): Secondary | ICD-10-CM | POA: Diagnosis not present

## 2021-12-23 DIAGNOSIS — R2681 Unsteadiness on feet: Secondary | ICD-10-CM | POA: Diagnosis not present

## 2021-12-23 DIAGNOSIS — R2689 Other abnormalities of gait and mobility: Secondary | ICD-10-CM | POA: Diagnosis not present

## 2021-12-23 DIAGNOSIS — R29818 Other symptoms and signs involving the nervous system: Secondary | ICD-10-CM | POA: Diagnosis not present

## 2021-12-23 NOTE — Therapy (Signed)
[facility] [address] [place], [place], [address] Phone: [phone]   Fax:  [phone]  Physical Therapy Treatment  Patient Details  Name: [name] MRN: [id number] Date of Birth: December 15, 1950 Referring Provider (PT): [name], NP   Encounter Date: 12/23/2021   PT End of Session - 12/23/21 1309     Visit Number 16    Number of Visits 24    Date for PT Re-Evaluation 01/04/22    Authorization Type UHC Medicare    Progress Note Due on Visit 18    PT Start Time 1305    PT Stop Time 1344    PT Time Calculation (min) 39 min    Equipment Utilized During Treatment Gait belt    Activity Tolerance Patient tolerated treatment well;Patient limited by fatigue    Behavior During Therapy WFL for tasks assessed/performed             Past Medical History:  Diagnosis Date   Allergy    Carpal tunnel syndrome    Chronic back pain    Chronic combined systolic and diastolic congestive heart failure (HCC)    Chronic ITP (idiopathic thrombocytopenia) ([place]) 05/25/2015   Coronary artery disease    Coronary artery disease involving native coronary artery of native heart with angina pectoris ([place])    Degenerative disc disease    GERD (gastroesophageal reflux disease)    History of thrombocytopenia    Hypercholesteremia    Hypertension    Hypothyroid    Lumbar pain    Myocardial infarction ([facility]) 2009   Obesity    Pneumonia    around age 36   S/P CABG x 3 04/27/2018   LIMA to LAD SVG to OM1 SVG to OM2   Shortness of breath dyspnea     Past Surgical History:  Procedure Laterality Date   ANTERIOR CERVICAL DECOMP/DISCECTOMY FUSION N/A 07/19/2021   Procedure: CERVICAL THREE-FOUR ANTERIOR CERVICAL DECOMPRESSION/DISCECTOMY FUSION;  Surgeon: [name], MD;  Location: [facility];  Service: Neurosurgery;  Laterality: N/A;   ANTERIOR CERVICAL DECOMP/DISCECTOMY FUSION N/A 07/22/2021   Procedure: ANTERIOR CERVICAL DECOMPRESSION/DISCECTOMY  REVISON FUSION  WITH EVACUATION OF EPIDURAL HEMATOMA;  Surgeon: [name], DO;  Location: [facility];  Service: Neurosurgery;  Laterality: N/A;   BACK SURGERY     5 lumbas disc with cervical and lumbar fusions   BIOPSY N/A 03/06/2014   Procedure: ESOPHAGEAL BIOPSIES;  Surgeon: [name], MD;  [facility];  Service: Endoscopy;  Laterality: N/A;   COLONOSCOPY     COLONOSCOPY WITH PROPOFOL N/A 03/06/2014   Procedure: COLONOSCOPY WITH PROPOFOL;  Surgeon: [name], MD;  [facility];  Service: Endoscopy;  Laterality: N/A;  in cecum at 0807; total withdrawal time 9 minutes   CORONARY ANGIOPLASTY WITH STENT PLACEMENT     2000, and 2004 has 3 stents   CORONARY ARTERY BYPASS GRAFT N/A 04/27/2018   Procedure: CORONARY ARTERY BYPASS GRAFTING (CABG) x Three , using left internal mammary artery and right leg greater saphenous vein;  Surgeon: [name], MD;  Location: [facility];  Service: Open Heart Surgery;  Laterality: N/A;   ESOPHAGOGASTRODUODENOSCOPY (EGD) WITH PROPOFOL N/A 03/06/2014   Procedure: ESOPHAGOGASTRODUODENOSCOPY (EGD) WITH PROPOFOL;  Surgeon: [name], MD;  [facility];  Service: Endoscopy;  Laterality: N/A;   LEFT HEART CATH AND CORONARY ANGIOGRAPHY N/A 04/24/2018   Procedure: LEFT HEART CATH AND CORONARY ANGIOGRAPHY;  Surgeon: [name], MD;  Location: [facility];  Service: Cardiovascular;  Laterality: N/A;   LUMBAR FUSION  2009   MALONEY DILATION N/A 03/06/2014   Procedure: MALONEY DILATION 54 french;  Surgeon: [name], MD;  [facility];  Service: Endoscopy;  Laterality: N/A;   NECK SURGERY     TEE WITHOUT CARDIOVERSION N/A 04/27/2018   Procedure: TRANSESOPHAGEAL ECHOCARDIOGRAM (TEE);  Surgeon: [name], MD;  Location: [facility];  Service: Open Heart Surgery;  Laterality: N/A;    There were no vitals filed for this visit.   Subjective Assessment - 12/23/21 1307     Subjective Patient reports some new numbness in his arms  and legs. He has a f/u with MD about this on the 2/28. Otherwise doing well, no new falls.    Currently in Pain? Yes    Pain Score 3     Pain Location Neck    Pain Orientation Posterior    Pain Descriptors / Indicators Aching                               [facility] Adult PT Treatment/Exercise - 12/23/21 0001       Knee/Hip Exercises: Standing   Hip Flexion Both;2 sets;10 reps    Hip Flexion Limitations 3lb    Hip Abduction Both;2 sets;10 reps    Abduction Limitations 3lb    Forward Step Up Both;2 sets;10 reps;Hand Hold: 2;Step Height: 4"    Gait Training 400 and 200 feet with Rollator      Knee/Hip Exercises: Seated   Sit to Sand 10 reps;2 sets                       PT Short Term Goals - 11/23/21 1524       PT SHORT TERM GOAL #1   Title Patient will be able to complete 5x STS in under 15 seconds in order to reduce the risk of falls.    Baseline 20 sec with BUE support, increased time without UE support, 18" seat height    Time 3    Period Weeks    Status On-going    Target Date 11/23/21      PT SHORT TERM GOAL #2   Title Decrease risk for falls per TUG test time 12 sec with least restrictive AD    Baseline 16 sec with RW    Time 3    Period Weeks    Status On-going    Target Date 11/23/21      PT SHORT TERM GOAL #3   Title Patient will report at least 25% improvement in symptoms for improved quality of life.    Baseline 25% improved    Time 3    Period Weeks    Status Achieved    Target Date 10/20/21      PT SHORT TERM GOAL #4   Title Negotiate curb and ascend/descend 12 stairs with supervision to improve safety with mobility    Baseline CGA with RW for curb, CGA with BHR stair ambulation x 8 stairs    Time 2    Period Weeks    Status On-going    Target Date 11/23/21               PT Long Term Goals - 11/23/21 1524       PT LONG TERM GOAL #1   Title Demo 4/5 BLE strength to improve safety and stability for  standing  ADL and gait    Baseline 4- to 4/5 gross BLE    Time 6    Period Weeks    Status Partially Met    Target Date 12/07/21      PT LONG TERM GOAL #2   Title Demo modified independent ambulation x 350 ft to improve functional ambulation    Baseline 400 ft with supervision with RW    Time 6    Period Weeks    Status On-going    Target Date 12/07/21      PT LONG TERM GOAL #3   Title Ascend/descend curb with least restrictive AD to improve safety with ambulation    Baseline CGA with RW    Time 6    Period Weeks    Status On-going    Target Date 12/07/21                   Plan - 12/23/21 1341     Clinical Impression Statement Patient returns after 1 week absence form therapy. Patient tolerated session well today but with noted increase in fatigue throughout session. Patient given rest breaks as needed. Reduces LE strengthening. Lowered weights to 3lb to accommodate level of fatigue. Patient cued on posturing and foot placement with 4 inch step ups. Patient will continue to benefit from skilled therapy services to reduce deficits and improve functional levels.    Personal Factors and Comorbidities Age;Comorbidity 1;Time since onset of injury/illness/exacerbation    Comorbidities hx of multiple spine surgeries    Examination-Activity Limitations Bathing;Bend;Carry;Lift;Toileting;Stand;Stairs;Squat;Reach Overhead;Locomotion Level;Transfers    Examination-Participation Restrictions Cleaning;Yard Work;Meal Prep;Driving;Community Activity    Stability/Clinical Decision Making Evolving/Moderate complexity    Rehab Potential Good    PT Frequency 2x / week    PT Duration 6 weeks    PT Treatment/Interventions ADLs/Self Care Home Management;Electrical Stimulation;DME Instruction;Gait training;Stair training;Functional mobility training;Therapeutic activities;Therapeutic exercise;Balance training;Patient/family education;Neuromuscular re-education;Wheelchair mobility training;Manual  techniques;Dry needling    PT Next Visit Plan Progress gait, balance and functional hip strengthening. Step ups, possibly power tower or overweight suspension for gait without AD    PT Home Exercise Plan SLR; 12/13: bridge and clam; 12/15: chair push up; 12/20 sit to stand; supine hip abduction 12/22 band clamshell, laq    Consulted and Agree with Plan of Care Patient             Patient will benefit from skilled therapeutic intervention in order to improve the following deficits and impairments:  Abnormal gait, Decreased activity tolerance, Decreased balance, Decreased mobility, Decreased knowledge of use of DME, Decreased endurance, Decreased coordination, Decreased strength, Difficulty walking, Postural dysfunction, Improper body mechanics, Pain  Visit Diagnosis: Difficulty in walking, not elsewhere classified  Other lack of coordination  Muscle weakness (generalized)     Problem List Patient Active Problem List   Diagnosis Date Noted   Spasticity 12/10/2021   Foot drop, bilateral 12/10/2021   Wheelchair dependence 12/10/2021   Nerve pain 12/10/2021   Cervical myelopathy (HCC)    Hyponatremia    Neurogenic bladder    Pressure injury of skin 08/08/2021   Acute incomplete quadriplegia ([place]) 08/01/2021   Epidural hematoma 07/22/2021   Myelopathy ([place]) 07/19/2021   Callus 05/18/2021   Acute bacterial rhinosinusitis 10/05/2020   Cough in adult 10/05/2020   S/P CABG x 3 04/27/2018   Angina pectoris ([place]) 04/24/2018   Obesity    Chronic combined systolic and diastolic congestive heart failure ([place])    Peripheral edema 06/30/2017   Pedal edema  11/28/2016   Osteopenia 08/13/2015   Thrombocytopenia ([place]) 05/25/2015   Chronic ITP (idiopathic thrombocytopenia) ([place]) 05/25/2015   Hypothyroidism 08/29/2014   Diverticulitis of colon (without mention of hemorrhage)(562.11) 02/04/2014   Dysphagia, unspecified(787.20) 02/04/2014   Hyperlipidemia 11/18/2013   Dyspnea 01/02/2012    Palpitations 01/02/2012   Mixed hyperlipidemia 07/06/2011   Coronary artery disease involving native coronary artery of native heart with angina pectoris ([place])    TOTAL KNEE FOLLOW-UP 08/14/2008   Chronic back pain 07/23/2008   KNEE, ARTHRITIS, DEGEN./OSTEO 05/29/2008   JOINT EFFUSION, KNEE 04/30/2008   Pain in joint, lower leg 10/10/2007   Essential hypertension 10/09/2007   1:46 PM, 12/23/21 Josue Hector PT DPT  Physical Therapist with [facility]  [phone] [place], [place], [address] Phone: [phone]   Fax:  [phone]  Name: EVANN ERAZO MRN: [id number] Date of Birth: 05-31-1951

## 2021-12-28 ENCOUNTER — Other Ambulatory Visit: Payer: Self-pay | Admitting: Physical Medicine and Rehabilitation

## 2021-12-28 ENCOUNTER — Ambulatory Visit (HOSPITAL_COMMUNITY): Payer: Medicare Other | Admitting: Physical Therapy

## 2021-12-28 ENCOUNTER — Ambulatory Visit (HOSPITAL_COMMUNITY): Payer: Medicare Other | Admitting: Occupational Therapy

## 2021-12-28 DIAGNOSIS — E038 Other specified hypothyroidism: Secondary | ICD-10-CM

## 2021-12-28 DIAGNOSIS — I5042 Chronic combined systolic (congestive) and diastolic (congestive) heart failure: Secondary | ICD-10-CM

## 2021-12-28 DIAGNOSIS — D696 Thrombocytopenia, unspecified: Secondary | ICD-10-CM

## 2021-12-28 DIAGNOSIS — I10 Essential (primary) hypertension: Secondary | ICD-10-CM

## 2021-12-28 DIAGNOSIS — Z0000 Encounter for general adult medical examination without abnormal findings: Secondary | ICD-10-CM

## 2021-12-28 DIAGNOSIS — I25118 Atherosclerotic heart disease of native coronary artery with other forms of angina pectoris: Secondary | ICD-10-CM

## 2021-12-28 DIAGNOSIS — R200 Anesthesia of skin: Secondary | ICD-10-CM

## 2021-12-28 DIAGNOSIS — E7849 Other hyperlipidemia: Secondary | ICD-10-CM

## 2021-12-29 ENCOUNTER — Encounter: Payer: Self-pay | Admitting: Physical Medicine and Rehabilitation

## 2021-12-29 ENCOUNTER — Telehealth: Payer: Self-pay | Admitting: Family Medicine

## 2021-12-29 NOTE — Telephone Encounter (Signed)
May have 30-day thank you ?

## 2021-12-29 NOTE — Telephone Encounter (Signed)
[facility] [address] requesting refill on Tamsulosin 0.4 mg capsules. Take 2 capsule po at night. Pt last seen 09/20/21 for Thrombocytopenia. Please advise. Thank you ?

## 2021-12-30 ENCOUNTER — Other Ambulatory Visit: Payer: Self-pay

## 2021-12-30 ENCOUNTER — Encounter (HOSPITAL_COMMUNITY): Payer: Self-pay | Admitting: Physical Therapy

## 2021-12-30 ENCOUNTER — Ambulatory Visit (HOSPITAL_COMMUNITY): Payer: Medicare Other | Attending: Registered Nurse | Admitting: Physical Therapy

## 2021-12-30 DIAGNOSIS — R278 Other lack of coordination: Secondary | ICD-10-CM | POA: Insufficient documentation

## 2021-12-30 DIAGNOSIS — R2689 Other abnormalities of gait and mobility: Secondary | ICD-10-CM | POA: Insufficient documentation

## 2021-12-30 DIAGNOSIS — R262 Difficulty in walking, not elsewhere classified: Secondary | ICD-10-CM | POA: Insufficient documentation

## 2021-12-30 DIAGNOSIS — M6281 Muscle weakness (generalized): Secondary | ICD-10-CM | POA: Diagnosis not present

## 2021-12-30 MED ORDER — TAMSULOSIN HCL 0.4 MG PO CAPS: ORAL_CAPSULE | ORAL | 0 refills | Status: DC

## 2021-12-30 MED ORDER — GABAPENTIN 600 MG PO TABS
600.0000 mg | ORAL_TABLET | Freq: Three times a day (TID) | ORAL | 1 refills | Status: DC
Start: 2021-12-30 — End: 2022-05-06

## 2021-12-30 NOTE — Addendum Note (Signed)
Addended by: Dairl Ponder on: 12/30/2021 08:18 AM ? ? Modules accepted: Orders ? ?

## 2021-12-30 NOTE — Telephone Encounter (Signed)
Prescription sent electronically to pharmacy. 

## 2021-12-30 NOTE — Therapy (Signed)
[place], [place], [address] Phone: [phone]   Fax:  [phone]  Physical Therapy Treatment  Patient Details  Name: [name] MRN: [id number] Date of Birth: 1951/03/28 Referring Provider (PT): [name], NP  Progress Note Reporting Period 11/23/21 to 12/30/21  See note below for Objective Data and Assessment of Progress/Goals.      Encounter Date: 12/30/2021   PT End of Session - 12/30/21 1436     Visit Number 17    Number of Visits 25    Date for PT Re-Evaluation 01/27/22    Authorization Type UHC Medicare    Progress Note Due on Visit 25    PT Start Time 1431    PT Stop Time 1514    PT Time Calculation (min) 43 min    Equipment Utilized During Treatment Gait belt    Activity Tolerance Patient tolerated treatment well;Patient limited by fatigue    Behavior During Therapy WFL for tasks assessed/performed             Past Medical History:  Diagnosis Date   Allergy    Carpal tunnel syndrome    Chronic back pain    Chronic combined systolic and diastolic congestive heart failure (HCC)    Chronic ITP (idiopathic thrombocytopenia) (HCC) 05/25/2015   Coronary artery disease    Coronary artery disease involving native coronary artery of native heart with angina pectoris (HCC)    Degenerative disc disease    GERD (gastroesophageal reflux disease)    History of thrombocytopenia    Hypercholesteremia    Hypertension    Hypothyroid    Lumbar pain    Myocardial infarction ([place]) 2009   Obesity    Pneumonia    around age 56   S/P CABG x 3 04/27/2018   LIMA to LAD SVG to OM1 SVG to OM2   Shortness of breath dyspnea     Past Surgical History:  Procedure Laterality Date   ANTERIOR CERVICAL DECOMP/DISCECTOMY FUSION N/A 07/19/2021   Procedure: CERVICAL THREE-FOUR ANTERIOR CERVICAL DECOMPRESSION/DISCECTOMY FUSION;  Surgeon: [name], MD;  Location: [facility];  Service: Neurosurgery;   Laterality: N/A;   ANTERIOR CERVICAL DECOMP/DISCECTOMY FUSION N/A 07/22/2021   Procedure: ANTERIOR CERVICAL DECOMPRESSION/DISCECTOMY REVISON FUSION  WITH EVACUATION OF EPIDURAL HEMATOMA;  Surgeon: [name], DO;  Location: [facility];  Service: Neurosurgery;  Laterality: N/A;   BACK SURGERY     5 lumbas disc with cervical and lumbar fusions   BIOPSY N/A 03/06/2014   Procedure: ESOPHAGEAL BIOPSIES;  Surgeon: [name], MD;  [facility];  Service: Endoscopy;  Laterality: N/A;   COLONOSCOPY     COLONOSCOPY WITH PROPOFOL N/A 03/06/2014   Procedure: COLONOSCOPY WITH PROPOFOL;  Surgeon: [name], MD;  [facility];  Service: Endoscopy;  Laterality: N/A;  in cecum at 0807; total withdrawal time 9 minutes   CORONARY ANGIOPLASTY WITH STENT PLACEMENT     2000, and 2004 has 3 stents   CORONARY ARTERY BYPASS GRAFT N/A 04/27/2018   Procedure: CORONARY ARTERY BYPASS GRAFTING (CABG) x Three , using left internal mammary artery and right leg greater saphenous vein;  Surgeon: [name], MD;  Location: [facility];  Service: Open Heart Surgery;  Laterality: N/A;   ESOPHAGOGASTRODUODENOSCOPY (EGD) WITH PROPOFOL N/A 03/06/2014   Procedure: ESOPHAGOGASTRODUODENOSCOPY (EGD) WITH PROPOFOL;  Surgeon: [name], MD;  [facility];  Service: Endoscopy;  Laterality: N/A;   LEFT HEART CATH AND  CORONARY ANGIOGRAPHY N/A 04/24/2018   Procedure: LEFT HEART CATH AND CORONARY ANGIOGRAPHY;  Surgeon: [name], MD;  Location: [facility];  Service: Cardiovascular;  Laterality: N/A;   LUMBAR FUSION  2009   MALONEY DILATION N/A 03/06/2014   Procedure: MALONEY DILATION 54 french;  Surgeon: [name], MD;  [facility];  Service: Endoscopy;  Laterality: N/A;   NECK SURGERY     TEE WITHOUT CARDIOVERSION N/A 04/27/2018   Procedure: TRANSESOPHAGEAL ECHOCARDIOGRAM (TEE);  Surgeon: [name], MD;  Location: [facility];  Service: Open Heart Surgery;  Laterality: N/A;    There were no  vitals filed for this visit.   Subjective Assessment - 12/30/21 1434     Subjective Pateitn says he has good days and bad days. He says his numbness is good today but comes and goes. He did not have imaging because his surgeon rescheduled his appoitnment.    Pertinent History 71 y/o male who underwent C3/4 ACDF on 07/19/21 then presented back to the ED on 07/20/21 with cervical stenosis with right greater than left weakness due to C3-4 epidural hematoma; cervical myelopathy. Pt underwent C3/4 revision and evacuation of hematoma, Placement of intervertebral biomechanical device C3-4, globus interbody, and Placement of anterior instrumentation consisting of interbody plate and screws    Limitations Standing;Walking;House hold activities    Currently in Pain? Yes    Pain Score 3     Pain Location Neck    Pain Orientation Posterior    Pain Descriptors / Indicators Aching    Pain Type Chronic pain    Pain Onset In the past 7 days    Pain Frequency Constant                [facility] PT Assessment - 12/30/21 0001       Assessment   Medical Diagnosis Acute incomplete quadriplegia    Referring Provider (PT) [name], NP    Prior Therapy Acute and CIR, some HH      Precautions   Precautions Cervical;Fall      Restrictions   Weight Bearing Restrictions No      Balance Screen   Has the patient fallen in the past 6 months Yes    How many times? 3-4    Has the patient had a decrease in activity level because of a fear of falling?  No    Is the patient reluctant to leave their home because of a fear of falling?  No      Home Ecologist residence    Living Arrangements Spouse/significant other      Prior Function   Level of Independence Independent      Cognition   Overall Cognitive Status Within Functional Limits for tasks assessed      Strength   Right/Left Hip Right;Left    Right Hip Flexion 4/5   same   Right Hip Extension 3-/5    Right Hip  ABduction 2/5    Left Hip Flexion 4+/5   was 4   Left Hip Extension 3-/5    Left Hip ABduction 2/5    Right Knee Extension 5/5   was 4-   Left Knee Extension 4+/5   was 4   Right Ankle Dorsiflexion 5/5   was 4   Left Ankle Dorsiflexion 4+/5   was 4     Transfers   Five time sit to stand comments  17 sec with hands on thighs  Comments TUG 12.8 using RW      Ambulation/Gait   Ambulation/Gait Yes    Ambulation/Gait Assistance 6: Modified independent (Device/Increase time);5: Supervision    Ambulation Distance (Feet) 452 Feet    Assistive device Rollator    Gait Pattern Trunk flexed;Ataxic   bilateral foot slap once fatigued   Ambulation Surface Level;Indoor    Stairs Yes    Stairs Assistance 6: Modified independent (Device/Increase time);5: Supervision    Stair Management Technique Two rails;Step to pattern    Number of Stairs 12    Height of Stairs 7                                      PT Short Term Goals - 12/30/21 1459       PT SHORT TERM GOAL #1   Title Patient will be able to complete 5x STS in under 15 seconds in order to reduce the risk of falls.    Baseline 17 sec with hands on thighs    Time 3    Period Weeks    Status On-going    Target Date 11/23/21      PT SHORT TERM GOAL #2   Title Decrease risk for falls per TUG test time 12 sec with least restrictive AD    Baseline 12.8 sec with RW    Time 3    Period Weeks    Status Partially Met    Target Date 11/23/21      PT SHORT TERM GOAL #3   Title Patient will report at least 25% improvement in symptoms for improved quality of life.    Baseline Reports 80%    Time 3    Period Weeks    Status Achieved    Target Date 10/20/21      PT SHORT TERM GOAL #4   Title Negotiate curb and ascend/descend 12 stairs with supervision to improve safety with mobility    Baseline See stairs comments    Time 2    Period Weeks    Status Achieved    Target Date 11/23/21                PT Long Term Goals - 12/30/21 1500       PT LONG TERM GOAL #1   Title Demo 4/5 BLE strength to improve safety and stability for standing ADL and gait    Baseline See MMT    Time 6    Period Weeks    Status Partially Met    Target Date 12/07/21      PT LONG TERM GOAL #2   Title Demo modified independent ambulation x 350 ft to improve functional ambulation    Baseline 452 ft with supervision with rollater    Time 6    Period Weeks    Status Achieved    Target Date 12/07/21      PT LONG TERM GOAL #3   Title Ascend/descend curb with least restrictive AD to improve safety with ambulation    Baseline See stairs comments    Time 6    Period Weeks    Status Achieved    Target Date 12/07/21                   Plan - 12/30/21 1540     Clinical Impression Statement Patient demos steady progress toward therapy goals. Patient shows improved ambulatory ability, as  well as ability to ambulate curbs and stairs. Patient does require ongoing support with use of AD during gait and railing for stair ambulation. Patient showing significant improvement in distal LE strengthening but has significant ongoing weakness in bilateral hips which continues to negatively impact function. Patient will continue to benefit from skilled therapy services to address remaining deficits for improved functional mobility and LOF with ADL.    Personal Factors and Comorbidities Age;Comorbidity 1;Time since onset of injury/illness/exacerbation    Comorbidities hx of multiple spine surgeries    Examination-Activity Limitations Bathing;Bend;Carry;Lift;Toileting;Stand;Stairs;Squat;Reach Overhead;Locomotion Level;Transfers    Examination-Participation Restrictions Cleaning;Yard Work;Meal Prep;Driving;Community Activity    Stability/Clinical Decision Making Evolving/Moderate complexity    Rehab Potential Good    PT Frequency 2x / week    PT Duration 4 weeks    PT Treatment/Interventions ADLs/Self Care Home  Management;Electrical Stimulation;DME Instruction;Gait training;Stair training;Functional mobility training;Therapeutic activities;Therapeutic exercise;Balance training;Patient/family education;Neuromuscular re-education;Wheelchair mobility training;Manual techniques;Dry needling    PT Next Visit Plan Progress gait, balance and functional hip strengthening. Step ups, sidestepping, hip abduction and extension strength    PT Home Exercise Plan SLR; 12/13: bridge and clam; 12/15: chair push up; 12/20 sit to stand; supine hip abduction 12/22 band clamshell, laq    Consulted and Agree with Plan of Care Patient             Patient will benefit from skilled therapeutic intervention in order to improve the following deficits and impairments:  Abnormal gait, Decreased activity tolerance, Decreased balance, Decreased mobility, Decreased knowledge of use of DME, Decreased endurance, Decreased coordination, Decreased strength, Difficulty walking, Postural dysfunction, Improper body mechanics, Pain  Visit Diagnosis: Difficulty in walking, not elsewhere classified  Other lack of coordination  Muscle weakness (generalized)     Problem List Patient Active Problem List   Diagnosis Date Noted   Spasticity 12/10/2021   Foot drop, bilateral 12/10/2021   Wheelchair dependence 12/10/2021   Nerve pain 12/10/2021   Cervical myelopathy (HCC)    Hyponatremia    Neurogenic bladder    Pressure injury of skin 08/08/2021   Acute incomplete quadriplegia ([place]) 08/01/2021   Epidural hematoma 07/22/2021   Myelopathy ([place]) 07/19/2021   Callus 05/18/2021   Acute bacterial rhinosinusitis 10/05/2020   Cough in adult 10/05/2020   S/P CABG x 3 04/27/2018   Angina pectoris ([place]) 04/24/2018   Obesity    Chronic combined systolic and diastolic congestive heart failure ([place])    Peripheral edema 06/30/2017   Pedal edema 11/28/2016   Osteopenia 08/13/2015   Thrombocytopenia ([place]) 05/25/2015   Chronic ITP  (idiopathic thrombocytopenia) ([place]) 05/25/2015   Hypothyroidism 08/29/2014   Diverticulitis of colon (without mention of hemorrhage)(562.11) 02/04/2014   Dysphagia, unspecified(787.20) 02/04/2014   Hyperlipidemia 11/18/2013   Dyspnea 01/02/2012   Palpitations 01/02/2012   Mixed hyperlipidemia 07/06/2011   Coronary artery disease involving native coronary artery of native heart with angina pectoris ([place])    TOTAL KNEE FOLLOW-UP 08/14/2008   Chronic back pain 07/23/2008   KNEE, ARTHRITIS, DEGEN./OSTEO 05/29/2008   JOINT EFFUSION, KNEE 04/30/2008   Pain in joint, lower leg 10/10/2007   Essential hypertension 10/09/2007   3:50 PM, 12/30/21 Josue Hector PT DPT  Physical Therapist with [facility]  [phone] [place] [address] [place], [place], [address] Phone: [phone]   Fax:  [phone]  Name: [name] MRN: [id number] Date of Birth: 27-Nov-1950

## 2022-01-05 ENCOUNTER — Other Ambulatory Visit: Payer: Self-pay

## 2022-01-05 ENCOUNTER — Ambulatory Visit (INDEPENDENT_AMBULATORY_CARE_PROVIDER_SITE_OTHER): Payer: Medicare Other | Admitting: Family Medicine

## 2022-01-05 VITALS — BP 130/78 | HR 51 | Temp 97.7°F | Ht 71.0 in | Wt 238.0 lb

## 2022-01-05 DIAGNOSIS — E038 Other specified hypothyroidism: Secondary | ICD-10-CM | POA: Diagnosis not present

## 2022-01-05 DIAGNOSIS — I10 Essential (primary) hypertension: Secondary | ICD-10-CM

## 2022-01-05 DIAGNOSIS — D696 Thrombocytopenia, unspecified: Secondary | ICD-10-CM | POA: Diagnosis not present

## 2022-01-05 DIAGNOSIS — R748 Abnormal levels of other serum enzymes: Secondary | ICD-10-CM

## 2022-01-05 DIAGNOSIS — E785 Hyperlipidemia, unspecified: Secondary | ICD-10-CM | POA: Diagnosis not present

## 2022-01-05 DIAGNOSIS — Z23 Encounter for immunization: Secondary | ICD-10-CM

## 2022-01-05 DIAGNOSIS — Z79899 Other long term (current) drug therapy: Secondary | ICD-10-CM | POA: Diagnosis not present

## 2022-01-05 MED ORDER — SERTRALINE HCL 100 MG PO TABS: ORAL_TABLET | ORAL | 3 refills | Status: DC

## 2022-01-05 NOTE — Patient Instructions (Signed)

## 2022-01-05 NOTE — Progress Notes (Signed)
? ?  Subjective:  ? ? Patient ID: [name], male    DOB: [birth year], 71 y.o.   MRN: [id number] ? ?HPI ? ?Patient here for 3 month follow up.  He says he needs to get blood work done. ?Essential hypertension - Plan: Lipid Profile, Vitamin D (25 hydroxy), TSH, Basic Metabolic Panel (BMET), CBC with Differential, CANCELED: Basic Metabolic Panel (BMET), CANCELED: Lipase, CANCELED: Hepatic function panel ? ?High risk medication use - Plan: Lipid Profile, Vitamin D (25 hydroxy), TSH, Basic Metabolic Panel (BMET), CBC with Differential, CANCELED: Basic Metabolic Panel (BMET), CANCELED: Lipase, CANCELED: Hepatic function panel ? ?Elevated lipase - Plan: Lipid Profile, Vitamin D (25 hydroxy), TSH, Basic Metabolic Panel (BMET), CBC with Differential, CANCELED: Basic Metabolic Panel (BMET), CANCELED: Lipase, CANCELED: Hepatic function panel ? ?Other specified hypothyroidism - Plan: Lipid Profile, Vitamin D (25 hydroxy), TSH, Basic Metabolic Panel (BMET), CBC with Differential ? ?Thrombocytopenia ([place]), Chronic - Plan: Lipid Profile, Vitamin D (25 hydroxy), TSH, Basic Metabolic Panel (BMET), CBC with Differential ? ?Need for vaccination - Plan: Pneumococcal conjugate vaccine 20-valent (Prevnar 20) ? ?Hyperlipidemia, unspecified hyperlipidemia type - Plan: Lipid Profile ?This patient had serious weakness related into a spinal condition had surgery had a hematoma complications made it worse now with ongoing leg weakness uses a walker to get around ? ?Has hypothyroidism takes his medicine regular basis ?Has history of thrombocytopenia sometimes very severe needs follow-up lab work ?Blood pressure issues takes his medicine watches diet ? ?Review of Systems ? ?   ?Objective:  ? Physical Exam ? ?General-in no acute distress ?Eyes-no discharge ?Lungs-respiratory rate normal, CTA ?CV-no murmurs,RRR ?Extremities skin warm dry no edema ?Neuro grossly normal ?Behavior normal, alert ?Clinical wekness in arms and hands ?Also drop foot  right side and weakness left leg ? ? ?   ?Assessment & Plan:  ?1. Essential hypertension ?BP good continue meds ?- Lipid Profile ?- Vitamin D (25 hydroxy) ?- TSH ?- Basic Metabolic Panel (BMET) ?- CBC with Differential ? ?2. High risk medication use ?Check labs ?- Lipid Profile ?- Vitamin D (25 hydroxy) ?- TSH ?- Basic Metabolic Panel (BMET) ?- CBC with Differential ? ?3. Elevated lipids ?Continue meds check labs ?- Lipid Profile ?- Vitamin D (25 hydroxy) ?- TSH ?- Basic Metabolic Panel (BMET) ?- CBC with Differential ? ?4. Other specified hypothyroidism ?Continue meds check labs ?- Lipid Profile ?- Vitamin D (25 hydroxy) ?- TSH ?- Basic Metabolic Panel (BMET) ?- CBC with Differential ? ?5. Thrombocytopenia ([place]) ?Recheck ?History of severe thrombocytopenia ?No bleeding ? ? ?- Lipid Profile ?- Vitamin D (25 hydroxy) ?- TSH ?- Basic Metabolic Panel (BMET) ?- CBC with Differential ? ?6. Need for vaccination ?today ?- Pneumococcal conjugate vaccine 20-valent (Prevnar 20) ? ?Cervical myelop[athy and weakness ?Also depression mild ?Change to sertraline dc celexa ? ? ?Continue PT and physical doc visits ?Try to reduce muscle relaxers and gabapentin if possible ? ?Occas short term memeory loss ?Monitor ?Today 2/3 clock fair ? ?Recheck 4 months ?

## 2022-01-09 ENCOUNTER — Other Ambulatory Visit: Payer: Self-pay | Admitting: Family Medicine

## 2022-01-09 DIAGNOSIS — I5042 Chronic combined systolic (congestive) and diastolic (congestive) heart failure: Secondary | ICD-10-CM

## 2022-01-09 DIAGNOSIS — Z0000 Encounter for general adult medical examination without abnormal findings: Secondary | ICD-10-CM

## 2022-01-09 DIAGNOSIS — D696 Thrombocytopenia, unspecified: Secondary | ICD-10-CM

## 2022-01-09 DIAGNOSIS — I10 Essential (primary) hypertension: Secondary | ICD-10-CM

## 2022-01-09 DIAGNOSIS — E7849 Other hyperlipidemia: Secondary | ICD-10-CM

## 2022-01-09 DIAGNOSIS — E038 Other specified hypothyroidism: Secondary | ICD-10-CM

## 2022-01-09 DIAGNOSIS — I25118 Atherosclerotic heart disease of native coronary artery with other forms of angina pectoris: Secondary | ICD-10-CM

## 2022-01-09 DIAGNOSIS — R200 Anesthesia of skin: Secondary | ICD-10-CM

## 2022-01-13 ENCOUNTER — Other Ambulatory Visit (HOSPITAL_COMMUNITY): Payer: Self-pay | Admitting: Neurosurgery

## 2022-01-13 ENCOUNTER — Other Ambulatory Visit: Payer: Self-pay

## 2022-01-13 ENCOUNTER — Ambulatory Visit (HOSPITAL_COMMUNITY): Payer: Medicare Other | Admitting: Physical Therapy

## 2022-01-13 ENCOUNTER — Encounter (HOSPITAL_COMMUNITY): Payer: Self-pay | Admitting: Physical Therapy

## 2022-01-13 DIAGNOSIS — R262 Difficulty in walking, not elsewhere classified: Secondary | ICD-10-CM | POA: Diagnosis not present

## 2022-01-13 DIAGNOSIS — G959 Disease of spinal cord, unspecified: Secondary | ICD-10-CM | POA: Diagnosis not present

## 2022-01-13 DIAGNOSIS — M6281 Muscle weakness (generalized): Secondary | ICD-10-CM | POA: Diagnosis not present

## 2022-01-13 DIAGNOSIS — R2689 Other abnormalities of gait and mobility: Secondary | ICD-10-CM | POA: Diagnosis not present

## 2022-01-13 DIAGNOSIS — R278 Other lack of coordination: Secondary | ICD-10-CM | POA: Diagnosis not present

## 2022-01-13 NOTE — Therapy (Signed)
?OUTPATIENT PHYSICAL THERAPY TREATMENT NOTE ? ? ?Patient Name: [name] ?MRN: [id number] ?DOB:08-27-1951, 71 y.o., male ?Today's Date: 01/13/2022 ? ?PCP: Kathyrn Drown, MD ?REFERRING PROVIDER: [name], NP  ? ? PT End of Session - 01/13/22 1307   ? ? Visit Number 18   ? Number of Visits 25   ? Date for PT Re-Evaluation 01/27/22   ? Authorization Type UHC Medicare   ? Progress Note Due on Visit 25   ? PT Start Time 1304   ? PT Stop Time 1344   ? PT Time Calculation (min) 40 min   ? Equipment Utilized During Treatment Gait belt   ? Activity Tolerance Patient tolerated treatment well;Patient limited by fatigue   ? Behavior During Therapy [facility] for tasks assessed/performed   ? ?  ?  ? ?  ? ? ?Past Medical History:  ?Diagnosis Date  ? Allergy   ? Carpal tunnel syndrome   ? Chronic back pain   ? Chronic combined systolic and diastolic congestive heart failure ([place])   ? Chronic ITP (idiopathic thrombocytopenia) (HCC) 05/25/2015  ? Coronary artery disease   ? Coronary artery disease involving native coronary artery of native heart with angina pectoris ([place])   ? Degenerative disc disease   ? GERD (gastroesophageal reflux disease)   ? History of thrombocytopenia   ? Hypercholesteremia   ? Hypertension   ? Hypothyroid   ? Lumbar pain   ? Myocardial infarction [facility]) 2009  ? Obesity   ? Pneumonia   ? around age 36  ? S/P CABG x 3 04/27/2018  ? LIMA to LAD SVG to OM1 SVG to OM2  ? Shortness of breath dyspnea   ? ?Past Surgical History:  ?Procedure Laterality Date  ? ANTERIOR CERVICAL DECOMP/DISCECTOMY FUSION N/A 07/19/2021  ? Procedure: CERVICAL THREE-FOUR ANTERIOR CERVICAL DECOMPRESSION/DISCECTOMY FUSION;  Surgeon: [name], MD;  Location: [facility];  Service: Neurosurgery;  Laterality: N/A;  ? ANTERIOR CERVICAL DECOMP/DISCECTOMY FUSION N/A 07/22/2021  ? Procedure: ANTERIOR CERVICAL DECOMPRESSION/DISCECTOMY REVISON FUSION  WITH EVACUATION OF EPIDURAL HEMATOMA;  Surgeon: Dawley, Theodoro Doing, DO;  Location: [facility];  Service:  Neurosurgery;  Laterality: N/A;  ? BACK SURGERY    ? 5 lumbas disc with cervical and lumbar fusions  ? BIOPSY N/A 03/06/2014  ? Procedure: ESOPHAGEAL BIOPSIES;  Surgeon: [name], MD;  [facility];  Service: Endoscopy;  Laterality: N/A;  ? COLONOSCOPY    ? COLONOSCOPY WITH PROPOFOL N/A 03/06/2014  ? Procedure: COLONOSCOPY WITH PROPOFOL;  Surgeon: [name], MD;  [facility];  Service: Endoscopy;  Laterality: N/A;  in cecum at 0807; total withdrawal time 9 minutes  ? CORONARY ANGIOPLASTY WITH STENT PLACEMENT    ? 2000, and 2004 has 3 stents  ? CORONARY ARTERY BYPASS GRAFT N/A 04/27/2018  ? Procedure: CORONARY ARTERY BYPASS GRAFTING (CABG) x Three , using left internal mammary artery and right leg greater saphenous vein;  Surgeon: [name], MD;  Location: [facility];  Service: Open Heart Surgery;  Laterality: N/A;  ? ESOPHAGOGASTRODUODENOSCOPY (EGD) WITH PROPOFOL N/A 03/06/2014  ? Procedure: ESOPHAGOGASTRODUODENOSCOPY (EGD) WITH PROPOFOL;  Surgeon: [name], MD;  [facility];  Service: Endoscopy;  Laterality: N/A;  ? LEFT HEART CATH AND CORONARY ANGIOGRAPHY N/A 04/24/2018  ? Procedure: LEFT HEART CATH AND CORONARY ANGIOGRAPHY;  Surgeon: [name], MD;  Location: [place] [facility];  Service: Cardiovascular;  Laterality: N/A;  ? LUMBAR FUSION  2009  ? MALONEY  DILATION N/A 03/06/2014  ? Procedure: MALONEY DILATION 54 french;  Surgeon: [name], MD;  [facility];  Service: Endoscopy;  Laterality: N/A;  ? NECK SURGERY    ? TEE WITHOUT CARDIOVERSION N/A 04/27/2018  ? Procedure: TRANSESOPHAGEAL ECHOCARDIOGRAM (TEE);  Surgeon: [name], MD;  Location: [place];  Service: Open Heart Surgery;  Laterality: N/A;  ? ?Patient Active Problem List  ? Diagnosis Date Noted  ? Spasticity 12/10/2021  ? Foot drop, bilateral 12/10/2021  ? Wheelchair dependence 12/10/2021  ? Nerve pain 12/10/2021  ? Cervical myelopathy ([place])   ? Hyponatremia   ? Neurogenic bladder   ? Pressure injury  of skin 08/08/2021  ? Acute incomplete quadriplegia ([place]) 08/01/2021  ? Epidural hematoma 07/22/2021  ? Myelopathy ([place]) 07/19/2021  ? Callus 05/18/2021  ? Acute bacterial rhinosinusitis 10/05/2020  ? Cough in adult 10/05/2020  ? S/P CABG x 3 04/27/2018  ? Angina pectoris ([place]) 04/24/2018  ? Obesity   ? Chronic combined systolic and diastolic congestive heart failure ([place])   ? Peripheral edema 06/30/2017  ? Pedal edema 11/28/2016  ? Osteopenia 08/13/2015  ? Thrombocytopenia ([place]) 05/25/2015  ? Chronic ITP (idiopathic thrombocytopenia) (HCC) 05/25/2015  ? Hypothyroidism 08/29/2014  ? Diverticulitis of colon (without mention of hemorrhage)(562.11) 02/04/2014  ? Dysphagia, unspecified(787.20) 02/04/2014  ? Hyperlipidemia 11/18/2013  ? Dyspnea 01/02/2012  ? Palpitations 01/02/2012  ? Mixed hyperlipidemia 07/06/2011  ? Coronary artery disease involving native coronary artery of native heart with angina pectoris ([place])   ? TOTAL KNEE FOLLOW-UP 08/14/2008  ? Chronic back pain 07/23/2008  ? KNEE, ARTHRITIS, DEGEN./OSTEO 05/29/2008  ? JOINT EFFUSION, KNEE 04/30/2008  ? Pain in joint, lower leg 10/10/2007  ? Essential hypertension 10/09/2007  ? ? ?REFERRING DIAG: G82.50 (ICD-10-CM) - Acute incomplete quadriplegia ([place])  ? ?THERAPY DIAG:  ?Difficulty in walking, not elsewhere classified ? ?Other lack of coordination ? ?Muscle weakness (generalized) ? ?Other abnormalities of gait and mobility ? ?PERTINENT HISTORY: 9x spine surgery, quadriplegia ? ?PRECAUTIONS: Cervical;Fall  ? ?SUBJECTIVE: Doing well today. Had a fall yesterday trying to get something off the floor in his closet. No injury reported. Toughest thing was getting himself off of the floor.  ? ?PAIN:  ?Are you having pain? Yes: NPRS scale: 5/10 ?Pain location: hip and low back  ?Pain description: aching, sore  ?Aggravating factors: standing, WB ?Relieving factors: Rest ? ? ? ? ?TODAY'S TREATMENT:  ?01/13/22 ?Standing marching 5lb x20 each ?Standing hip abduction 5lb 3  x 10 each ?Standing hip extension 5lb 3 x 10 each  ?Mini squats HHA x2 2 x10  ?Gait 1 RT in clinic using [place] crutches  ? ? ?PATIENT EDUCATION: ?Education details: On exercise form and function  ?Person educated: Patient ?Education method: Explanation and Demonstration ?Education comprehension: verbalized understanding and returned demonstration ? ? ?HOME EXERCISE PROGRAM: ?SLR; 12/13: bridge and clam; 12/15: chair push up; 12/20 sit to stand; supine hip abduction 12/22 band clamshell, laq  ? ? PT Short Term Goals - 12/30/21 1459   ? ?  ? PT SHORT TERM GOAL #1  ? Title Patient will be able to complete 5x STS in under 15 seconds in order to reduce the risk of falls.   ? Baseline 17 sec with hands on thighs   ? Time 3   ? Period Weeks   ? Status On-going   ? Target Date 11/23/21   ?  ? PT SHORT TERM GOAL #2  ? Title Decrease risk for falls per  TUG test time 12 sec with least restrictive AD   ? Baseline 12.8 sec with RW   ? Time 3   ? Period Weeks   ? Status Partially Met   ? Target Date 11/23/21   ?  ? PT SHORT TERM GOAL #3  ? Title Patient will report at least 25% improvement in symptoms for improved quality of life.   ? Baseline Reports 80%   ? Time 3   ? Period Weeks   ? Status Achieved   ? Target Date 10/20/21   ?  ? PT SHORT TERM GOAL #4  ? Title Negotiate curb and ascend/descend 12 stairs with supervision to improve safety with mobility   ? Baseline See stairs comments   ? Time 2   ? Period Weeks   ? Status Achieved   ? Target Date 11/23/21   ? ?  ?  ? ?  ? ? ? PT Long Term Goals - 12/30/21 1500   ? ?  ? PT LONG TERM GOAL #1  ? Title Demo 4/5 BLE strength to improve safety and stability for standing ADL and gait   ? Baseline See MMT   ? Time 6   ? Period Weeks   ? Status Partially Met   ? Target Date 12/07/21   ?  ? PT LONG TERM GOAL #2  ? Title Demo modified independent ambulation x 350 ft to improve functional ambulation   ? Baseline 452 ft with supervision with rollater   ? Time 6   ? Period Weeks   ?  Status Achieved   ? Target Date 12/07/21   ?  ? PT LONG TERM GOAL #3  ? Title Ascend/descend curb with least restrictive AD to improve safety with ambulation   ? Baseline See stairs comments   ? Time 6   ? P

## 2022-01-19 ENCOUNTER — Encounter (HOSPITAL_COMMUNITY): Payer: Medicare Other | Admitting: Physical Therapy

## 2022-01-25 ENCOUNTER — Telehealth (HOSPITAL_COMMUNITY): Payer: Self-pay | Admitting: Physical Therapy

## 2022-01-25 NOTE — Telephone Encounter (Signed)
Patient l/m to cx this apptment no reason given ?

## 2022-01-26 ENCOUNTER — Encounter (HOSPITAL_COMMUNITY): Payer: Medicare Other | Admitting: Physical Therapy

## 2022-01-27 ENCOUNTER — Other Ambulatory Visit: Payer: Self-pay | Admitting: Family Medicine

## 2022-01-28 ENCOUNTER — Other Ambulatory Visit: Payer: Self-pay

## 2022-01-28 MED ORDER — PANTOPRAZOLE SODIUM 40 MG PO TBEC: 40.0000 mg | DELAYED_RELEASE_TABLET | Freq: Every day | ORAL | 1 refills | Status: DC

## 2022-02-01 ENCOUNTER — Ambulatory Visit (HOSPITAL_COMMUNITY)
Admission: RE | Admit: 2022-02-01 | Discharge: 2022-02-01 | Disposition: A | Discharge status: Home / Self Care | Payer: Medicare Other | Source: Ambulatory Visit | Attending: Neurosurgery | Admitting: Neurosurgery

## 2022-02-01 DIAGNOSIS — M542 Cervicalgia: Secondary | ICD-10-CM | POA: Diagnosis not present

## 2022-02-01 DIAGNOSIS — G959 Disease of spinal cord, unspecified: Secondary | ICD-10-CM | POA: Diagnosis not present

## 2022-02-01 DIAGNOSIS — M4802 Spinal stenosis, cervical region: Secondary | ICD-10-CM | POA: Diagnosis not present

## 2022-02-01 DIAGNOSIS — M4312 Spondylolisthesis, cervical region: Secondary | ICD-10-CM | POA: Diagnosis not present

## 2022-02-02 ENCOUNTER — Telehealth (HOSPITAL_COMMUNITY): Payer: Self-pay

## 2022-02-02 ENCOUNTER — Encounter (HOSPITAL_COMMUNITY): Payer: Medicare Other

## 2022-02-02 NOTE — Telephone Encounter (Signed)
Patient requested to cx and did not want to r/s at this time ?

## 2022-02-03 ENCOUNTER — Other Ambulatory Visit: Payer: Self-pay | Admitting: Family Medicine

## 2022-02-08 ENCOUNTER — Other Ambulatory Visit: Payer: Self-pay | Admitting: Family Medicine

## 2022-02-08 DIAGNOSIS — E038 Other specified hypothyroidism: Secondary | ICD-10-CM

## 2022-02-08 DIAGNOSIS — I5042 Chronic combined systolic (congestive) and diastolic (congestive) heart failure: Secondary | ICD-10-CM

## 2022-02-08 DIAGNOSIS — Z79899 Other long term (current) drug therapy: Secondary | ICD-10-CM | POA: Diagnosis not present

## 2022-02-08 DIAGNOSIS — I25118 Atherosclerotic heart disease of native coronary artery with other forms of angina pectoris: Secondary | ICD-10-CM

## 2022-02-08 DIAGNOSIS — R748 Abnormal levels of other serum enzymes: Secondary | ICD-10-CM | POA: Diagnosis not present

## 2022-02-08 DIAGNOSIS — E785 Hyperlipidemia, unspecified: Secondary | ICD-10-CM | POA: Diagnosis not present

## 2022-02-08 DIAGNOSIS — Z0000 Encounter for general adult medical examination without abnormal findings: Secondary | ICD-10-CM

## 2022-02-08 DIAGNOSIS — R200 Anesthesia of skin: Secondary | ICD-10-CM

## 2022-02-08 DIAGNOSIS — D696 Thrombocytopenia, unspecified: Secondary | ICD-10-CM | POA: Diagnosis not present

## 2022-02-08 DIAGNOSIS — I10 Essential (primary) hypertension: Secondary | ICD-10-CM | POA: Diagnosis not present

## 2022-02-08 DIAGNOSIS — E7849 Other hyperlipidemia: Secondary | ICD-10-CM

## 2022-02-09 LAB — CBC WITH DIFFERENTIAL/PLATELET
Basophils Absolute: 0 10*3/uL (ref 0.0–0.2)
Basos: 1 %
EOS (ABSOLUTE): 0.2 10*3/uL (ref 0.0–0.4)
Eos: 3 %
Hematocrit: 39.7 % (ref 37.5–51.0)
Hemoglobin: 12.8 g/dL — ABNORMAL LOW (ref 13.0–17.7)
Immature Grans (Abs): 0 10*3/uL (ref 0.0–0.1)
Immature Granulocytes: 1 %
Lymphocytes Absolute: 0.8 10*3/uL (ref 0.7–3.1)
Lymphs: 17 %
MCH: 29.6 pg (ref 26.6–33.0)
MCHC: 32.2 g/dL (ref 31.5–35.7)
MCV: 92 fL (ref 79–97)
Monocytes Absolute: 0.5 10*3/uL (ref 0.1–0.9)
Monocytes: 11 %
Neutrophils Absolute: 3.1 10*3/uL (ref 1.4–7.0)
Neutrophils: 67 %
Platelets: 130 10*3/uL — ABNORMAL LOW (ref 150–450)
RBC: 4.33 x10E6/uL (ref 4.14–5.80)
RDW: 14.2 % (ref 11.6–15.4)
WBC: 4.6 10*3/uL (ref 3.4–10.8)

## 2022-02-09 LAB — LIPID PANEL
Chol/HDL Ratio: 5.9 ratio — ABNORMAL HIGH (ref 0.0–5.0)
Cholesterol, Total: 223 mg/dL — ABNORMAL HIGH (ref 100–199)
HDL: 38 mg/dL — ABNORMAL LOW (ref >39)
LDL Chol Calc (NIH): 161 mg/dL — ABNORMAL HIGH (ref 0–99)
Triglycerides: 134 mg/dL (ref 0–149)
VLDL Cholesterol Cal: 24 mg/dL (ref 5–40)

## 2022-02-09 LAB — TSH: TSH: 0.082 u[IU]/mL — ABNORMAL LOW (ref 0.450–4.500)

## 2022-02-09 LAB — BASIC METABOLIC PANEL
BUN/Creatinine Ratio: 16 (ref 10–24)
BUN: 19 mg/dL (ref 8–27)
CO2: 26 mmol/L (ref 20–29)
Calcium: 9.4 mg/dL (ref 8.6–10.2)
Chloride: 103 mmol/L (ref 96–106)
Creatinine, Ser: 1.2 mg/dL (ref 0.76–1.27)
Glucose: 92 mg/dL (ref 70–99)
Potassium: 5 mmol/L (ref 3.5–5.2)
Sodium: 142 mmol/L (ref 134–144)
eGFR: 65 mL/min/{1.73_m2} (ref >59)

## 2022-02-09 LAB — VITAMIN D 25 HYDROXY (VIT D DEFICIENCY, FRACTURES): Vit D, 25-Hydroxy: 22.4 ng/mL — ABNORMAL LOW (ref 30.0–100.0)

## 2022-02-10 DIAGNOSIS — G959 Disease of spinal cord, unspecified: Secondary | ICD-10-CM | POA: Diagnosis not present

## 2022-02-11 ENCOUNTER — Other Ambulatory Visit: Payer: Self-pay | Admitting: Family Medicine

## 2022-02-11 MED ORDER — VITAMIN D (ERGOCALCIFEROL) 1.25 MG (50000 UNIT) PO CAPS
ORAL_CAPSULE | ORAL | 3 refills | Status: DC
Start: 2022-02-11 — End: 2022-06-22

## 2022-02-11 MED ORDER — ROSUVASTATIN CALCIUM 40 MG PO TABS: ORAL_TABLET | ORAL | 0 refills | Status: DC

## 2022-02-14 ENCOUNTER — Telehealth: Payer: Self-pay

## 2022-02-14 NOTE — Telephone Encounter (Signed)
? ? ?  Name: [name]  ?DOB: [birth year]  ?MRN: [id number] ? ?Primary Cardiologist: Jenkins Rouge, MD ? ?Chart reviewed as part of preop protocol. Last OV 04/2021, seen for regular follow-up as well as surgical clearance at that time, cleared by Dr. Johnsie Cancel to proceed. ? ?Preoperative team, please contact this patient and set up a phone call appointment for further preoperative risk assessment. Please obtain consent and complete medication review. Need to find out whether he is still on ASA? (Was on our medicine list the last time but stopped 07/2021 due to bleeding issues with recommendation to restart at the discretion of neurosurgery.) If he is on ASA, please reach out to surgical team to find out if they need to hold for surgery and how long and reply back to P CV DIV PREOP pool. ? ?Will retail in preop APP box until question of ASA clarified so we can route to Dr. Johnsie Cancel if needed. ? ? ?Charlie Pitter, PA-C ?02/14/2022, 11:20 AM ?[phone] ?[facility] ?[address] ?[place],  [address] ? ? ?

## 2022-02-14 NOTE — Telephone Encounter (Signed)
? ?  Pre-operative Risk Assessment  ?  ?Patient Name: [name]  ?DOB: [birth year] ?MRN: [id number] ? ?  ? ?Request for Surgical Clearance ?  ? ?Procedure:   C3-C4 Posterior Cervical Fusion ? ?Date of Surgery:  Clearance TBD                              ? ?Surgeon:  Dr Dominica Severin P. Saintclair Halsted ?Surgeon's Group or Practice Name:  [facility] ?Phone number:  [phone] ?Fax number:  [phone] Lorriane Shire) ? ?Type of Clearance Requested:   ?- Medical  ?  ?Type of Anesthesia:  General  ?  ?Additional requests/questions:   Please contact patient for appointment if necessary for risk stratification. ? ?Signed, ?Ulice Brilliant T   ?02/14/2022, 8:28 AM  ? ?

## 2022-02-16 ENCOUNTER — Ambulatory Visit (INDEPENDENT_AMBULATORY_CARE_PROVIDER_SITE_OTHER): Payer: Medicare Other | Admitting: Physician Assistant

## 2022-02-16 ENCOUNTER — Encounter: Payer: Self-pay | Admitting: Physician Assistant

## 2022-02-16 ENCOUNTER — Telehealth: Payer: Self-pay | Admitting: *Deleted

## 2022-02-16 DIAGNOSIS — Z01810 Encounter for preprocedural cardiovascular examination: Secondary | ICD-10-CM

## 2022-02-16 NOTE — Progress Notes (Signed)
? ?Virtual Visit via Telephone Note  ? ?This visit type was conducted due to national recommendations for restrictions regarding the COVID-19 Pandemic (e.g. social distancing) in an effort to limit this patient's exposure and mitigate transmission in our community.  Due to his co-morbid illnesses, this patient is at least at moderate risk for complications without adequate follow up.  This format is felt to be most appropriate for this patient at this time.  The patient did not have access to video technology/had technical difficulties with video requiring transitioning to audio format only (telephone).  All issues noted in this document were discussed and addressed.  No physical exam could be performed with this format.  Please refer to the patient's chart for his  consent to telehealth for [facility]. ? ?Evaluation Performed:  Preoperative cardiovascular risk assessment ?_____________  ? ?Date:  02/16/2022  ? ?Patient ID:  [name], [name] [birth year], MRN [id number] ?Patient Location:  ?Home ?Provider location:   ?Office ? ?Primary Care Provider:  Kathyrn Drown, MD ?Primary Cardiologist:  Oscar Rouge, MD ? ?Chief Complaint  ?  ?71 y.o. y/o male with a h/o CAD s/p CABG, who is pending C3-C4 Posterior Cervical Fusion, and presents today for telephonic preoperative cardiovascular risk assessment. ? ?Past Medical History  ?  ?Past Medical History:  ?Diagnosis Date  ? Allergy   ? Carpal tunnel syndrome   ? Chronic back pain   ? Chronic combined systolic and diastolic congestive heart failure ([place])   ? Chronic ITP (idiopathic thrombocytopenia) (H[facility]) 05/25/2015  ? Coronary artery disease   ? Coronary artery disease involving native coronary artery of native heart with angina pectoris ([place])   ? Degenerative disc disease   ? GERD (gastroesophageal reflux disease)   ? History of thrombocytopenia   ? Hypercholesteremia   ? Hypertension   ? Hypothyroid   ? Lumbar pain   ? Myocardial infarction [facility]) 2009  ? Obesity    ? Pneumonia   ? around age 70  ? S/P CABG x 3 04/27/2018  ? LIMA to LAD SVG to OM1 SVG to OM2  ? Shortness of breath dyspnea   ? ?Past Surgical History:  ?Procedure Laterality Date  ? ANTERIOR CERVICAL DECOMP/DISCECTOMY FUSION N/A 07/19/2021  ? Procedure: CERVICAL THREE-FOUR ANTERIOR CERVICAL DECOMPRESSION/DISCECTOMY FUSION;  Surgeon: [name], MD;  Location: [place];  Service: Neurosurgery;  Laterality: N/A;  ? ANTERIOR CERVICAL DECOMP/DISCECTOMY FUSION N/A 07/22/2021  ? Procedure: ANTERIOR CERVICAL DECOMPRESSION/DISCECTOMY REVISON FUSION  WITH EVACUATION OF EPIDURAL HEMATOMA;  Surgeon: Dawley, Theodoro Doing, DO;  Location: [place];  Service: Neurosurgery;  Laterality: N/A;  ? BACK SURGERY    ? 5 lumbas disc with cervical and lumbar fusions  ? BIOPSY N/A 03/06/2014  ? Procedure: ESOPHAGEAL BIOPSIES;  Surgeon: [name], MD;  [facility];  Service: Endoscopy;  Laterality: N/A;  ? COLONOSCOPY    ? COLONOSCOPY WITH PROPOFOL N/A 03/06/2014  ? Procedure: COLONOSCOPY WITH PROPOFOL;  Surgeon: [name], MD;  [facility]: [facility];  Service: Endoscopy;  Laterality: N/A;  in cecum at 0807; total withdrawal time 9 minutes  ? CORONARY ANGIOPLASTY WITH STENT PLACEMENT    ? 2000, and 2004 has 3 stents  ? CORONARY ARTERY BYPASS GRAFT N/A 04/27/2018  ? Procedure: CORONARY ARTERY BYPASS GRAFTING (CABG) x Three , using left internal mammary artery and right leg greater saphenous vein;  Surgeon: [name], MD;  Location: [place];  Service: Open Heart Surgery;  Laterality: N/A;  ?  ESOPHAGOGASTRODUODENOSCOPY (EGD) WITH PROPOFOL N/A 03/06/2014  ? Procedure: ESOPHAGOGASTRODUODENOSCOPY (EGD) WITH PROPOFOL;  Surgeon: [name], MD;  [facility];  Service: Endoscopy;  Laterality: N/A;  ? LEFT HEART CATH AND CORONARY ANGIOGRAPHY N/A 04/24/2018  ? Procedure: LEFT HEART CATH AND CORONARY ANGIOGRAPHY;  Surgeon: [name], MD;  Location: [place] CV [facility];  Service: Cardiovascular;  Laterality: N/A;  ? LUMBAR FUSION  2009   ? MALONEY DILATION N/A 03/06/2014  ? Procedure: MALONEY DILATION 54 french;  Surgeon: [name], MD;  [facility];  Service: Endoscopy;  Laterality: N/A;  ? NECK SURGERY    ? TEE WITHOUT CARDIOVERSION N/A 04/27/2018  ? Procedure: TRANSESOPHAGEAL ECHOCARDIOGRAM (TEE);  Surgeon: [name], MD;  Location: [place];  Service: Open Heart Surgery;  Laterality: N/A;  ? ? ?Allergies ? ?Allergies  ?Allergen Reactions  ? Penicillins Other (See Comments)  ?  SYNCOPAL EPISODE ? ?Has patient had a PCN reaction causing immediate rash, facial/tongue/throat swelling, SOB or LIGHTHEADEDNESS with HYPOTENSION [SYNCOPE] #  #  #  YES  #  #  #  ?Has patient had a PCN reaction causing severe rash involving mucus membranes or skin necrosis:No ?Has patient had a PCN reaction that required hospitalization:No ?Has patient had a PCN reaction occurring within the last 10 years:Yes ?  ? Sulfa Antibiotics Swelling  ?  SWELLING REACTION UNSPECIFIED > PER PREVIOUS PMH  ? Zoloft [Sertraline Hcl] Other (See Comments)  ?  Confusion ?  ? ? ?History of Present Illness  ?  ?[name] is a 71 y.o. male who presents via audio/video conferencing for a telehealth visit today.  Pt was last seen in cardiology clinic on 05/04/21 by [name].  At that time [name] was doing well.  The patient is now pending C3-C4 Posterior Cervical Fusion.  Since his last visit, he suffered a spinal hematoma resulting in quadriplegia and is now in a wheelchair. Will not be able to complete virtual visit for preop risk stratification.  ? ? ?Home Medications  ?  ?Prior to Admission medications   ?Medication Sig Start Date End Date Taking? Authorizing Provider  ?acetaminophen (TYLENOL) 325 MG tablet Take 2 tablets (650 mg total) by mouth every 6 (six) hours as needed for mild pain (or Fever >/= 101). 08/23/21   Angiulli, Lavon Paganini, PA-C  ?albuterol (VENTOLIN HFA) 108 (90 Base) MCG/ACT inhaler Inhale 2 puffs into the lungs every 6 (six) hours as needed for  wheezing or shortness of breath. 08/23/21   Angiulli, Lavon Paganini, PA-C  ?alendronate (FOSAMAX) 70 MG tablet Take 1 tablet (70 mg total) by mouth every 7 (seven) days. Take with a full glass of water on an empty stomach. 05/25/21   Oscar Drown, MD  ?baclofen (LIORESAL) 10 MG tablet Take 0.5 tablets (5 mg total) by mouth 3 (three) times daily. X 1 weeks- then can increase to 10 mg 3x/day- for spasticity 12/10/21   Lovorn, Jinny Blossom, MD  ?cholecalciferol (VITAMIN D3) 25 MCG (1000 UNIT) tablet Take 1,000 Units by mouth daily.    [provider]  ?enoxaparin (LOVENOX) 60 MG/0.6ML injection Lovenox 50 mg daily through 11/20/2021 and stop 08/24/21 11/20/21  Angiulli, Lavon Paganini, PA-C  ?ezetimibe (ZETIA) 10 MG tablet Take 1 tablet (10 mg total) by mouth daily. 09/27/21   Oscar Drown, MD  ?gabapentin (NEURONTIN) 600 MG tablet Take 1 tablet (600 mg total) by mouth 3 (three) times daily. 12/30/21   Courtney Heys, MD  ?  levothyroxine (SYNTHROID) 150 MCG tablet TAKE 1 TABLET BY MOUTH EVERY DAY 02/08/22   Oscar Drown, MD  ?lisinopril (ZESTRIL) 10 MG tablet Take 1 tablet (10 mg total) by mouth daily. 12/20/21   Oscar Drown, MD  ?methocarbamol (ROBAXIN) 500 MG tablet Take 1 tablet (500 mg total) by mouth 2 (two) times daily as needed for muscle spasms. ?Patient not taking: Reported on 02/16/2022 09/07/21   [name], NP  ?metoprolol tartrate (LOPRESSOR) 50 MG tablet TAKE 1 TABLET(50 MG) BY MOUTH TWICE DAILY 01/10/22   Oscar Drown, MD  ?Multiple Vitamin (MULTIVITAMIN) capsule Take 1 capsule by mouth daily.      [provider]  ?nitroGLYCERIN (NITROSTAT) 0.4 MG SL tablet PLACE 1 TABLET UNDER THE TONGUE EVERY 5 MINUTES AS NEEDED FOR CHEST PAINS. ?Patient taking differently: Place 0.4 mg under the tongue every 5 (five) minutes as needed for chest pain. 05/18/20   Josue Hector, MD  ?pantoprazole (PROTONIX) 40 MG tablet Take 1 tablet (40 mg total) by mouth daily. 01/28/22   Oscar Drown, MD  ?polycarbophil  (FIBERCON) 625 MG tablet Take 1 tablet (625 mg total) by mouth daily. ?Patient not taking: Reported on 01/05/2022 08/23/21   Cathlyn Parsons, PA-C  ?potassium chloride (KLOR-CON M) 10 MEQ tablet TAKE 1 TAB

## 2022-02-16 NOTE — Telephone Encounter (Signed)
Pt agreeable to plan of care for tele pre op appt today at 11 am. Med rec and consent are done.  ?

## 2022-02-16 NOTE — Progress Notes (Signed)
? ?Cardiology Office Note   ? ?Date:  02/18/2022  ? ?ID:  [name], DOB 04/26/1951, MRN [id number] ? ?PCP:  Oscar Drown, MD  ?Cardiologist: Oscar Rouge, MD   ? ?No chief complaint on file. ? ? ? ?History of Present Illness:   ? ?[name] is a 71 y.o. male with past medical history of CAD (s/p prior stenting of LCx and RCA in 2003, stenting of OM in 2012, CABG on 04/27/2018 with LIMA-LAD, SVG-OM1, and SVG-OM2), post-operative atrial fibrillation (occurring at time of CABG), HTN, HLD, Hypothyroidism and ITP  ? ?Needs a cervical fusion Unfortunately suffered a spinal hematoma and is paralyzed now Sees ?Dr [name] and needs fusion  ? ?Spends time in [place] where he has a time share Has a 3 legged Beagle at home  ? ?He has a positive attitude and hopes to regain use of his legs after upcoming surgery BP runs ok at home but very high in office today with asymptomatic PVCls ? ?No chest pain Gets around well with wheel chair ? ? ?Past Medical History:  ?Diagnosis Date  ? Allergy   ? Carpal tunnel syndrome   ? Chronic back pain   ? Chronic combined systolic and diastolic congestive heart failure ([place])   ? Chronic ITP (idiopathic thrombocytopenia) (H[facility]) 05/25/2015  ? Coronary artery disease   ? Coronary artery disease involving native coronary artery of native heart with angina pectoris ([place])   ? Degenerative disc disease   ? GERD (gastroesophageal reflux disease)   ? History of thrombocytopenia   ? Hypercholesteremia   ? Hypertension   ? Hypothyroid   ? Lumbar pain   ? Myocardial infarction [facility]) 2009  ? Obesity   ? Pneumonia   ? around age 65  ? S/P CABG x 3 04/27/2018  ? LIMA to LAD SVG to OM1 SVG to OM2  ? Shortness of breath dyspnea   ? ? ?Past Surgical History:  ?Procedure Laterality Date  ? ANTERIOR CERVICAL DECOMP/DISCECTOMY FUSION N/A 07/19/2021  ? Procedure: CERVICAL THREE-FOUR ANTERIOR CERVICAL DECOMPRESSION/DISCECTOMY FUSION;  Surgeon: Oscar Kos, MD;  Location: [place];  Service: Neurosurgery;   Laterality: N/A;  ? ANTERIOR CERVICAL DECOMP/DISCECTOMY FUSION N/A 07/22/2021  ? Procedure: ANTERIOR CERVICAL DECOMPRESSION/DISCECTOMY REVISON FUSION  WITH EVACUATION OF EPIDURAL HEMATOMA;  Surgeon: Dawley, Theodoro Doing, DO;  Location: [place];  Service: Neurosurgery;  Laterality: N/A;  ? BACK SURGERY    ? 5 lumbas disc with cervical and lumbar fusions  ? BIOPSY N/A 03/06/2014  ? Procedure: ESOPHAGEAL BIOPSIES;  Surgeon: Oscar Houston, MD;  [facility];  Service: Endoscopy;  Laterality: N/A;  ? COLONOSCOPY    ? COLONOSCOPY WITH PROPOFOL N/A 03/06/2014  ? Procedure: COLONOSCOPY WITH PROPOFOL;  Surgeon: Oscar Houston, MD;  [facility];  Service: Endoscopy;  Laterality: N/A;  in cecum at 0807; total withdrawal time 9 minutes  ? CORONARY ANGIOPLASTY WITH STENT PLACEMENT    ? 2000, and 2004 has 3 stents  ? CORONARY ARTERY BYPASS GRAFT N/A 04/27/2018  ? Procedure: CORONARY ARTERY BYPASS GRAFTING (CABG) x Three , using left internal mammary artery and right leg greater saphenous vein;  Surgeon: Oscar Alberts, MD;  Location: [place];  Service: Open Heart Surgery;  Laterality: N/A;  ? ESOPHAGOGASTRODUODENOSCOPY (EGD) WITH PROPOFOL N/A 03/06/2014  ? Procedure: ESOPHAGOGASTRODUODENOSCOPY (EGD) WITH PROPOFOL;  Surgeon: Oscar Houston, MD;  [facility];  Service: Endoscopy;  Laterality: N/A;  ? LEFT HEART CATH AND CORONARY ANGIOGRAPHY  N/A 04/24/2018  ? Procedure: LEFT HEART CATH AND CORONARY ANGIOGRAPHY;  Surgeon: Oscar Booze, MD;  Location: [place] [facility];  Service: Cardiovascular;  Laterality: N/A;  ? LUMBAR FUSION  2009  ? MALONEY DILATION N/A 03/06/2014  ? Procedure: MALONEY DILATION 54 french;  Surgeon: Oscar Houston, MD;  [facility];  Service: Endoscopy;  Laterality: N/A;  ? NECK SURGERY    ? TEE WITHOUT CARDIOVERSION N/A 04/27/2018  ? Procedure: TRANSESOPHAGEAL ECHOCARDIOGRAM (TEE);  Surgeon: Oscar Alberts, MD;  Location: [place];  Service: Open Heart Surgery;  Laterality: N/A;  ? ? ?Current  Medications: ?Outpatient Medications Prior to Visit  ?Medication Sig Dispense Refill  ? acetaminophen (TYLENOL) 325 MG tablet Take 2 tablets (650 mg total) by mouth every 6 (six) hours as needed for mild pain (or Fever >/= 101).    ? albuterol (VENTOLIN HFA) 108 (90 Base) MCG/ACT inhaler Inhale 2 puffs into the lungs every 6 (six) hours as needed for wheezing or shortness of breath. 1 each 0  ? alendronate (FOSAMAX) 70 MG tablet Take 1 tablet (70 mg total) by mouth every 7 (seven) days. Take with a full glass of water on an empty stomach. 4 tablet 11  ? baclofen (LIORESAL) 10 MG tablet Take 0.5 tablets (5 mg total) by mouth 3 (three) times daily. X 1 weeks- then can increase to 10 mg 3x/day- for spasticity 90 each 5  ? cholecalciferol (VITAMIN D3) 25 MCG (1000 UNIT) tablet Take 1,000 Units by mouth daily.    ? ezetimibe (ZETIA) 10 MG tablet Take 1 tablet (10 mg total) by mouth daily. 90 tablet 2  ? gabapentin (NEURONTIN) 600 MG tablet Take 1 tablet (600 mg total) by mouth 3 (three) times daily. 270 tablet 1  ? levothyroxine (SYNTHROID) 150 MCG tablet TAKE 1 TABLET BY MOUTH EVERY DAY 90 tablet 1  ? lisinopril (ZESTRIL) 10 MG tablet Take 1 tablet (10 mg total) by mouth daily. 90 tablet 0  ? methocarbamol (ROBAXIN) 500 MG tablet Take 1 tablet (500 mg total) by mouth 2 (two) times daily as needed for muscle spasms. 60 tablet 2  ? metoprolol tartrate (LOPRESSOR) 50 MG tablet TAKE 1 TABLET(50 MG) BY MOUTH TWICE DAILY 180 tablet 1  ? Multiple Vitamin (MULTIVITAMIN) capsule Take 1 capsule by mouth daily.      ? nitroGLYCERIN (NITROSTAT) 0.4 MG SL tablet PLACE 1 TABLET UNDER THE TONGUE EVERY 5 MINUTES AS NEEDED FOR CHEST PAINS. (Patient taking differently: Place 0.4 mg under the tongue every 5 (five) minutes as needed for chest pain.) 25 tablet 3  ? pantoprazole (PROTONIX) 40 MG tablet Take 1 tablet (40 mg total) by mouth daily. 90 tablet 1  ? polycarbophil (FIBERCON) 625 MG tablet Take 1 tablet (625 mg total) by mouth  daily. 30 tablet 0  ? potassium chloride (KLOR-CON M) 10 MEQ tablet TAKE 1 TABLET(10 MEQ) BY MOUTH DAILY 30 tablet 1  ? rosuvastatin (CRESTOR) 40 MG tablet TAKE 1 TABLET(40 MG) BY MOUTH EVERY EVENING 90 tablet 0  ? sertraline (ZOLOFT) 100 MG tablet TAKE ONE TABLET BY MOUTH ONCE A DAY 90 tablet 3  ? tamsulosin (FLOMAX) 0.4 MG CAPS capsule TAKE 2 CAPSULES BY MOUTH AT NIGHT 180 capsule 1  ? torsemide (DEMADEX) 20 MG tablet TAKE 2 TABLETS(40 MG) BY MOUTH DAILY 180 tablet 1  ? Vitamin D, Ergocalciferol, (DRISDOL) 1.25 MG (50000 UNIT) CAPS capsule Take one capsule po once per week 4 capsule 3  ? enoxaparin (LOVENOX)  60 MG/0.6ML injection Lovenox 50 mg daily through 11/20/2021 and stop 14.5 mL 2  ? rosuvastatin (CRESTOR) 40 MG tablet Take one tablet po every evening (Patient not taking: Reported on 02/18/2022) 90 tablet 0  ? ?No facility-administered medications prior to visit.  ?  ? ?Allergies:   Penicillins, Sulfa antibiotics, and Zoloft [sertraline hcl]  ? ?Social History  ? ?Socioeconomic History  ? Marital status: Married  ?  Spouse name: Not on file  ? Number of children: Not on file  ? Years of education: Not on file  ? Highest education level: Not on file  ?Occupational History  ? Occupation: retireed  ?Tobacco Use  ? Smoking status: Former  ?  Packs/day: 2.00  ?  Years: 20.00  ?  Pack years: 40.00  ?  Types: Cigarettes  ?  Quit date: 07/06/1991  ?  Years since quitting: 30.6  ? Smokeless tobacco: Never  ?Vaping Use  ? Vaping Use: Never used  ?Substance and Sexual Activity  ? Alcohol use: Yes  ?  Comment: occassional  ? Drug use: No  ? Sexual activity: Not Currently  ?  Birth control/protection: None  ?Other Topics Concern  ? Not on file  ?Social History Narrative  ? Disabled due to chronic back pain  ? ?Social Determinants of Health  ? ?Financial Resource Strain: Not on file  ?Food Insecurity: Not on file  ?Transportation Needs: Not on file  ?Physical Activity: Not on file  ?Stress: Not on file  ?Social Connections:  Not on file  ?  ? ?Family History:  The patient's family history includes Heart attack in his father.  ? ?Review of Systems:   ?Please see the history of present illness.    ? ?General:  No chills, fever, night sweats or

## 2022-02-16 NOTE — Telephone Encounter (Signed)
Pt agreeable to plan of care for tele pre op appt today at 11 am. Med rec and consent are done.   ?Patient Consent for Virtual Visit  ? ? ?   ? ?[name] has provided verbal consent on 02/16/2022 for a virtual visit (video or telephone). ? ? ?CONSENT FOR VIRTUAL VISIT FOR:  [name]  ?By participating in this virtual visit I agree to the following: ? ?I hereby voluntarily request, consent and authorize [facility] and its employed or contracted physicians, physician assistants, nurse practitioners or other licensed health care professionals (the Practitioner), to provide me with telemedicine health care services (the ?Services") as deemed necessary by the treating Practitioner. I acknowledge and consent to receive the Services by the Practitioner via telemedicine. I understand that the telemedicine visit will involve communicating with the Practitioner through live audiovisual communication technology and the disclosure of certain medical information by electronic transmission. I acknowledge that I have been given the opportunity to request an in-person assessment or other available alternative prior to the telemedicine visit and am voluntarily participating in the telemedicine visit. ? ?I understand that I have the right to withhold or withdraw my consent to the use of telemedicine in the course of my care at any time, without affecting my right to future care or treatment, and that the Practitioner or I may terminate the telemedicine visit at any time. I understand that I have the right to inspect all information obtained and/or recorded in the course of the telemedicine visit and may receive copies of available information for a reasonable fee.  I understand that some of the potential risks of receiving the Services via telemedicine include:  ?Delay or interruption in medical evaluation due to technological equipment failure or disruption; ?Information transmitted may not be sufficient (e.g. poor  resolution of images) to allow for appropriate medical decision making by the Practitioner; and/or  ?In rare instances, security protocols could fail, causing a breach of personal health information. ? ?Furthermore, I acknowledge that it is my responsibility to provide information about my medical history, conditions and care that is complete and accurate to the best of my ability. I acknowledge that Practitioner's advice, recommendations, and/or decision may be based on factors not within their control, such as incomplete or inaccurate data provided by me or distortions of diagnostic images or specimens that may result from electronic transmissions. I understand that the practice of medicine is not an exact science and that Practitioner makes no warranties or guarantees regarding treatment outcomes. I acknowledge that a copy of this consent can be made available to me via my patient portal ([facility]), or I can request a printed copy by calling the office of [facility].   ? ?I understand that my insurance will be billed for this visit.  ? ?I have read or had this consent read to me. ?I understand the contents of this consent, which adequately explains the benefits and risks of the Services being provided via telemedicine.  ?I have been provided ample opportunity to ask questions regarding this consent and the Services and have had my questions answered to my satisfaction. ?I give my informed consent for the services to be provided through the use of telemedicine in my medical care ? ? ? ?

## 2022-02-16 NOTE — Telephone Encounter (Signed)
Left message for the pt to call the office to set up tele pre op appt.  

## 2022-02-16 NOTE — Progress Notes (Signed)
Pt has been scheduled to see Dr. Johnsie Cancel 02/18/22 8:30 for pre op clearance. ?

## 2022-02-17 ENCOUNTER — Other Ambulatory Visit: Payer: Self-pay

## 2022-02-17 ENCOUNTER — Other Ambulatory Visit: Payer: Self-pay | Admitting: Family Medicine

## 2022-02-17 DIAGNOSIS — E559 Vitamin D deficiency, unspecified: Secondary | ICD-10-CM

## 2022-02-17 DIAGNOSIS — Z125 Encounter for screening for malignant neoplasm of prostate: Secondary | ICD-10-CM

## 2022-02-17 DIAGNOSIS — E785 Hyperlipidemia, unspecified: Secondary | ICD-10-CM

## 2022-02-17 DIAGNOSIS — E038 Other specified hypothyroidism: Secondary | ICD-10-CM

## 2022-02-17 DIAGNOSIS — Z79899 Other long term (current) drug therapy: Secondary | ICD-10-CM

## 2022-02-17 DIAGNOSIS — I10 Essential (primary) hypertension: Secondary | ICD-10-CM

## 2022-02-17 NOTE — Telephone Encounter (Signed)
Has preop appt scheduled tomorrow. Will remove from preop box. Per office protocol, the provider seeing this patient should forward their finalized clearance decision to requesting party below.  ? ?

## 2022-02-18 ENCOUNTER — Encounter: Payer: Self-pay | Admitting: Cardiovascular Disease

## 2022-02-18 ENCOUNTER — Ambulatory Visit: Payer: Medicare Other | Admitting: Cardiovascular Disease

## 2022-02-18 VITALS — BP 180/90 | HR 66 | Ht 71.0 in | Wt 246.0 lb

## 2022-02-18 DIAGNOSIS — Z01810 Encounter for preprocedural cardiovascular examination: Secondary | ICD-10-CM | POA: Diagnosis not present

## 2022-02-18 DIAGNOSIS — I2510 Atherosclerotic heart disease of native coronary artery without angina pectoris: Secondary | ICD-10-CM

## 2022-02-18 DIAGNOSIS — E782 Mixed hyperlipidemia: Secondary | ICD-10-CM | POA: Diagnosis not present

## 2022-02-18 DIAGNOSIS — M5003 Cervical disc disorder with myelopathy, cervicothoracic region: Secondary | ICD-10-CM | POA: Diagnosis not present

## 2022-02-18 DIAGNOSIS — S064X0A Epidural hemorrhage without loss of consciousness, initial encounter: Secondary | ICD-10-CM | POA: Diagnosis not present

## 2022-02-18 DIAGNOSIS — I480 Paroxysmal atrial fibrillation: Secondary | ICD-10-CM | POA: Diagnosis not present

## 2022-02-18 DIAGNOSIS — S14109A Unspecified injury at unspecified level of cervical spinal cord, initial encounter: Secondary | ICD-10-CM | POA: Diagnosis not present

## 2022-02-18 MED ORDER — LISINOPRIL 20 MG PO TABS: 20.0000 mg | ORAL_TABLET | Freq: Every day | ORAL | 3 refills | Status: DC

## 2022-02-18 NOTE — Patient Instructions (Addendum)
Medication Instructions:  ?Your physician has recommended you make the following change in your medication:  ?1-INCREASE Lisinopril 20 mg by mouth daily. ? ?*If you need a refill on your cardiac medications before your next appointment, please call your pharmacy* ? ?Lab Work: ?If you have labs (blood work) drawn today and your tests are completely normal, you will receive your results only by: ?MyChart Message (if you have MyChart) OR ?A paper copy in the mail ?If you have any lab test that is abnormal or we need to change your treatment, we will call you to review the results. ? ?Testing/Procedures: ?None ordered today. ? ?Follow-Up: ?At [facility], you and your health needs are our priority.  As part of our continuing mission to provide you with exceptional heart care, we have created designated Provider Care Teams.  These Care Teams include your primary Cardiologist (physician) and Advanced Practice Providers (APPs -  Physician Assistants and Nurse Practitioners) who all work together to provide you with the care you need, when you need it. ? ?We recommend signing up for the patient portal called "MyChart".  Sign up information is provided on this After Visit Summary.  MyChart is used to connect with patients for Virtual Visits (Telemedicine).  Patients are able to view lab/test results, encounter notes, upcoming appointments, etc.  Non-urgent messages can be sent to your provider as well.   ?To learn more about what you can do with MyChart, go to [email or web address].   ? ?Your next appointment:   ?6 month(s) ? ?The format for your next appointment:   ?In Person ? ?Provider:   ?Jenkins Rouge, MD { ? ?Important Information About [place] ? ? ? ? ?  ?

## 2022-02-25 ENCOUNTER — Other Ambulatory Visit: Payer: Self-pay | Admitting: Neurosurgery

## 2022-03-06 ENCOUNTER — Other Ambulatory Visit: Payer: Self-pay | Admitting: Family Medicine

## 2022-03-21 ENCOUNTER — Encounter: Payer: Medicare Other | Attending: Registered Nurse | Admitting: Physical Medicine and Rehabilitation

## 2022-03-21 DIAGNOSIS — M21372 Foot drop, left foot: Secondary | ICD-10-CM | POA: Insufficient documentation

## 2022-03-21 DIAGNOSIS — G959 Disease of spinal cord, unspecified: Secondary | ICD-10-CM | POA: Insufficient documentation

## 2022-03-21 DIAGNOSIS — R252 Cramp and spasm: Secondary | ICD-10-CM | POA: Insufficient documentation

## 2022-03-21 DIAGNOSIS — M792 Neuralgia and neuritis, unspecified: Secondary | ICD-10-CM | POA: Insufficient documentation

## 2022-03-21 DIAGNOSIS — Z993 Dependence on wheelchair: Secondary | ICD-10-CM | POA: Insufficient documentation

## 2022-03-21 DIAGNOSIS — G8250 Quadriplegia, unspecified: Secondary | ICD-10-CM | POA: Insufficient documentation

## 2022-03-21 DIAGNOSIS — N319 Neuromuscular dysfunction of bladder, unspecified: Secondary | ICD-10-CM | POA: Insufficient documentation

## 2022-03-21 DIAGNOSIS — M21371 Foot drop, right foot: Secondary | ICD-10-CM | POA: Insufficient documentation

## 2022-03-22 NOTE — Progress Notes (Signed)
Surgical Instructions    Your procedure is scheduled on Friday June 2nd.  Report to [facility] Main Entrance "A" at 5:30  A.M., then check in with the Admitting office.  Call this number if you have problems the morning of surgery:  [phone]   If you have any questions prior to your surgery date call [phone]: Open Monday-Friday 8am-4pm    Remember:  Do not eat or drink after midnight the night before your surgery     Take these medicines the morning of surgery with A SIP OF WATER: ezetimibe (ZETIA) 10 MG tablet gabapentin (NEURONTIN) 600 MG tablet levothyroxine (SYNTHROID) 150 MCG tablet metoprolol tartrate (LOPRESSOR) 50 MG tablet pantoprazole (PROTONIX) 40 MG tablet sertraline (ZOLOFT) 100 MG tablet tamsulosin (FLOMAX) 0.4 MG CAPS capsule  IF NEEDED  acetaminophen (TYLENOL) 325 MG tablet albuterol (VENTOLIN HFA) 108 (90 Base) MCG/ACT inhaler - please bring with you to the hosptial baclofen (LIORESAL) 10 MG tablet nitroGLYCERIN (NITROSTAT) 0.4 MG SL tablet    As of today, STOP taking any Aspirin (unless otherwise instructed by your surgeon) Aleve, Naproxen, Ibuprofen, Motrin, Advil, Goody's, BC's, all herbal medications, fish oil, and all vitamins.           Do not wear jewelry  Do not wear lotions, powders, colognes, or deodorant. Do not shave 48 hours prior to surgery.  Men may shave face and neck. Do not bring valuables to the hospital. Do not wear nail polish  [place] is not responsible for any belongings or valuables. .   Do NOT Smoke (Tobacco/Vaping)  24 hours prior to your procedure  If you use a CPAP at night, you may bring your mask for your overnight stay.   Contacts, glasses, hearing aids, dentures or partials may not be worn into surgery, please bring cases for these belongings   For patients admitted to the hospital, discharge time will be determined by your treatment team.   Patients discharged the day of surgery will not be allowed to  drive home, and someone needs to stay with them for 24 hours.   SURGICAL WAITING ROOM VISITATION Patients having surgery or a procedure in a hospital may have two support people. Children under the age of 3 must have an adult with them who is not the patient. They may stay in the waiting area during the procedure and may switch out with other visitors. If the patient needs to stay at the hospital during part of their recovery, the visitor guidelines for inpatient rooms apply.  Please refer to the [facility] website for the visitor guidelines for Inpatients (after your surgery is over and you are in a regular room).       Special instructions:    Oral Hygiene is also important to reduce your risk of infection.  Remember - BRUSH YOUR TEETH THE MORNING OF SURGERY WITH YOUR REGULAR TOOTHPASTE   [place]- Preparing For Surgery  Before surgery, you can play an important role. Because skin is not sterile, your skin needs to be as free of germs as possible. You can reduce the number of germs on your skin by washing with CHG (chlorahexidine gluconate) Soap before surgery.  CHG is an antiseptic cleaner which kills germs and bonds with the skin to continue killing germs even after washing.     Please do not use if you have an allergy to CHG or antibacterial soaps. If your skin becomes reddened/irritated stop using the CHG.  Do not shave (including legs and underarms) for  at least 48 hours prior to first CHG shower. It is OK to shave your face.  Please follow these instructions carefully.     Shower the NIGHT BEFORE SURGERY and the MORNING OF SURGERY with CHG Soap.   If you chose to wash your hair, wash your hair first as usual with your normal shampoo. After you shampoo, rinse your hair and body thoroughly to remove the shampoo.  Then [employer] and genitals (private parts) with your normal soap and rinse thoroughly to remove soap.  After that Use CHG Soap as you would any other liquid soap.  You can apply CHG directly to the skin and wash gently with a scrungie or a clean washcloth.   Apply the CHG Soap to your body ONLY FROM THE NECK DOWN.  Do not use on open wounds or open sores. Avoid contact with your eyes, ears, mouth and genitals (private parts). Wash Face and genitals (private parts)  with your normal soap.   Wash thoroughly, paying special attention to the area where your surgery will be performed.  Thoroughly rinse your body with warm water from the neck down.  DO NOT shower/wash with your normal soap after using and rinsing off the CHG Soap.  Pat yourself dry with a CLEAN TOWEL.  Wear CLEAN PAJAMAS to bed the night before surgery  Place CLEAN SHEETS on your bed the night before your surgery  DO NOT SLEEP WITH PETS.   Day of Surgery:  Take a shower with CHG soap. Wear Clean/Comfortable clothing the morning of surgery Do not apply any deodorants/lotions.   Remember to brush your teeth WITH YOUR REGULAR TOOTHPASTE.    If you received a COVID test during your pre-op visit, it is requested that you wear a mask when out in public, stay away from anyone that may not be feeling well, and notify your surgeon if you develop symptoms. If you have been in contact with anyone that has tested positive in the last 10 days, please notify your surgeon.    Please read over the following fact sheets that you were given.

## 2022-03-23 ENCOUNTER — Other Ambulatory Visit: Payer: Self-pay

## 2022-03-23 ENCOUNTER — Encounter (HOSPITAL_COMMUNITY)
Admission: RE | Admit: 2022-03-23 | Discharge: 2022-03-23 | Disposition: A | Discharge status: Home / Self Care | Payer: Medicare Other | Source: Ambulatory Visit | Attending: Neurosurgery | Admitting: Neurosurgery

## 2022-03-23 ENCOUNTER — Encounter (HOSPITAL_COMMUNITY): Payer: Self-pay

## 2022-03-23 VITALS — BP 162/74 | HR 51 | Temp 97.8°F | Resp 18 | Ht 72.0 in | Wt 240.0 lb

## 2022-03-23 DIAGNOSIS — Z01818 Encounter for other preprocedural examination: Secondary | ICD-10-CM

## 2022-03-23 DIAGNOSIS — Z01812 Encounter for preprocedural laboratory examination: Secondary | ICD-10-CM | POA: Insufficient documentation

## 2022-03-23 DIAGNOSIS — Z87891 Personal history of nicotine dependence: Secondary | ICD-10-CM | POA: Diagnosis not present

## 2022-03-23 DIAGNOSIS — I2510 Atherosclerotic heart disease of native coronary artery without angina pectoris: Secondary | ICD-10-CM | POA: Diagnosis not present

## 2022-03-23 DIAGNOSIS — G8929 Other chronic pain: Secondary | ICD-10-CM | POA: Diagnosis not present

## 2022-03-23 DIAGNOSIS — Z955 Presence of coronary angioplasty implant and graft: Secondary | ICD-10-CM | POA: Insufficient documentation

## 2022-03-23 DIAGNOSIS — E785 Hyperlipidemia, unspecified: Secondary | ICD-10-CM | POA: Diagnosis not present

## 2022-03-23 DIAGNOSIS — D693 Immune thrombocytopenic purpura: Secondary | ICD-10-CM | POA: Insufficient documentation

## 2022-03-23 DIAGNOSIS — I5032 Chronic diastolic (congestive) heart failure: Secondary | ICD-10-CM | POA: Insufficient documentation

## 2022-03-23 DIAGNOSIS — G8250 Quadriplegia, unspecified: Secondary | ICD-10-CM | POA: Insufficient documentation

## 2022-03-23 DIAGNOSIS — I110 Hypertensive heart disease with heart failure: Secondary | ICD-10-CM | POA: Insufficient documentation

## 2022-03-23 DIAGNOSIS — Z981 Arthrodesis status: Secondary | ICD-10-CM | POA: Diagnosis not present

## 2022-03-23 DIAGNOSIS — K219 Gastro-esophageal reflux disease without esophagitis: Secondary | ICD-10-CM | POA: Diagnosis not present

## 2022-03-23 HISTORY — DX: Personal history of other diseases of the digestive system: Z87.19

## 2022-03-23 HISTORY — DX: Epidural hemorrhage with loss of consciousness status unknown, initial encounter: S06.4XAA

## 2022-03-23 LAB — TYPE AND SCREEN
ABO/RH(D): O POS
Antibody Screen: NEGATIVE

## 2022-03-23 LAB — BASIC METABOLIC PANEL
Anion gap: 5 (ref 5–15)
BUN: 11 mg/dL (ref 8–23)
CO2: 25 mmol/L (ref 22–32)
Calcium: 8.7 mg/dL — ABNORMAL LOW (ref 8.9–10.3)
Chloride: 111 mmol/L (ref 98–111)
Creatinine, Ser: 1.12 mg/dL (ref 0.61–1.24)
GFR, Estimated: >60 mL/min (ref >60)
Glucose, Bld: 133 mg/dL — ABNORMAL HIGH (ref 70–99)
Potassium: 4.7 mmol/L (ref 3.5–5.1)
Sodium: 141 mmol/L (ref 135–145)

## 2022-03-23 LAB — CBC
HCT: 38.5 % — ABNORMAL LOW (ref 39.0–52.0)
Hemoglobin: 12.6 g/dL — ABNORMAL LOW (ref 13.0–17.0)
MCH: 31.7 pg (ref 26.0–34.0)
MCHC: 32.7 g/dL (ref 30.0–36.0)
MCV: 97 fL (ref 80.0–100.0)
Platelets: 108 10*3/uL — ABNORMAL LOW (ref 150–400)
RBC: 3.97 MIL/uL — ABNORMAL LOW (ref 4.22–5.81)
RDW: 14 % (ref 11.5–15.5)
WBC: 4.8 10*3/uL (ref 4.0–10.5)
nRBC: 0 % (ref 0.0–0.2)

## 2022-03-23 LAB — SURGICAL PCR SCREEN
MRSA, PCR: NEGATIVE
Staphylococcus aureus: POSITIVE — AB

## 2022-03-23 NOTE — Progress Notes (Signed)
PCP - Sallee Lange MD Cardiologist - Jenkins Rouge MD  PPM/ICD - denies Device Orders -  Rep Notified -   Chest x-ray - na EKG - 02/18/22 Stress Test - none ECHO - 04/24/18 Cardiac Cath - 04/24/18  Sleep Study - none CPAP -   Fasting Blood Sugar - na Checks Blood Sugar _____ times a day  Blood Thinner Instructions:na Aspirin Instructions:na  ERAS Protcol -no PRE-SURGERY Ensure or G2-   COVID TEST- na   Anesthesia review: yes-cardiac history  Patient denies shortness of breath, fever, cough and chest pain at PAT appointment   All instructions explained to the patient, with a verbal understanding of the material. Patient agrees to go over the instructions while at home for a better understanding. Patient also instructed to wear a mask when out in public prior to surgery.. The opportunity to ask questions was provided.

## 2022-03-24 ENCOUNTER — Encounter (HOSPITAL_COMMUNITY): Payer: Self-pay

## 2022-03-24 NOTE — Progress Notes (Signed)
Anesthesia Chart Review:  Case: [id number] Date/Time: 04/01/22 0715   Procedure: Post Cervical Lami/Multi level - C3-C4 - C4-C5 and fusion with lateral mass screws and posterolateral arthrodesis   Anesthesia type: General   Pre-op diagnosis: Disease of spinal cord   Location: [place] OR ROOM 20 / [place] [facility]   Surgeons: [name], MD       DISCUSSION: Patient is a 71 year old male scheduled for the above procedure.  History includes former smoker (quit 07/06/91), HTN, hypercholesterolemia, CAD (s/p PCI LAD, OM2 & CTO dRCA 07/25/01, PCI OM1 03/11/02; atherectomy/stent dRCA 07/19/02; DES OM 06/14/11; CABG 04/27/18 with LIMA-LAD, SVG-OM1, SVG-OM2), atrial fibrillation (post-CABG, no known recurrence), chronic systolic and diastolic CHF (EF 27-06% 2/37/62), exertional dyspnea, thrombocytopenia (ITP 05/2015, s/p prednisone), GERD, hiatal hernia, chronic back pain, spinal surgery (multiple surgeries including L4-S1 fusion 10/07/03; required temporary suprapubic catheter 09/12/19 for urinary retention after back surgery; C3-4 ACDF 07/19/21; presented to ED 07/22/21 with BLE/UE weakness/incomplete quadriplegia due to epidural hematoma, s/p revision and evacuation of hematoma).  Last seen by cardiologist Dr. Johnsie Cancel on 02/18/22. He notes, "Needs a cervical fusion Unfortunately suffered a spinal hematoma and is paralyzed now Sees Dr Saintclair Halsted and needs fusion" No chest pain. Asymptomatic of PVCs. BP elevated, but her reported readings "ok at home". Using a wheelchair. In regards to surgery, "Ok to hold ASA for any surgery...clear to have cervical spine fusion and hold ASA 5-7 days prior ".   Preoperative labs showed a PLT count of 108K, previously 130K. His PLT count has been variable--acute ITP was in 2016 (with PLT count as low as < 5 on 05/25/15) but had been as low as 108K on 07/22/21 and as high as 190K on 08/02/21 when looking a lab results in [facility] over the past year. I have notified Vanessa at Dr. Windy Carina office of his current PLT count  of 108K. H/H stable at 12.6/38.5. Cr 1.12, glucose 133.  A T&S was done with PAT labs.   Anesthesia team to evaluate on the day of surgery.    VS: BP (!) 162/74   Pulse (!) 51   Temp 36.6 C (Oral)   Resp 18   Ht 6' (1.829 m)   Wt 108.9 kg   SpO2 100%   BMI 32.55 kg/m    PROVIDERS: Kathyrn Drown, MD is PCP  Jenkins Rouge, MD is cardiologist Zola Button, MD is hematologist. Last office visit seen 12/28/16.  Platelet count close to normal then and no further intervention needed.  He recommended continued surveillance and could consider repeat steroids or IVIG if he developed relapse disease.  His PCP has been periodically checking CBCs.  - He is followed by urology   LABS: Labs reviewed: Acceptable for surgery. PLT 108K (PLT count 108-190 since 04/26/21 in [facility]). See DISCUSSION. (all labs ordered are listed, but only abnormal results are displayed)  Labs Reviewed  SURGICAL PCR SCREEN - Abnormal; Notable for the following components:      Result Value   Staphylococcus aureus POSITIVE (*)    All other components within normal limits  BASIC METABOLIC PANEL - Abnormal; Notable for the following components:   Glucose, Bld 133 (*)    Calcium 8.7 (*)    All other components within normal limits  CBC - Abnormal; Notable for the following components:   RBC 3.97 (*)    Hemoglobin 12.6 (*)    HCT 38.5 (*)    Platelets 108 (*)    All other components within normal limits  TYPE AND SCREEN    PFTs 04/25/18: FVC 3.84 (81%), post 3.84 (81%). FEV1 2.84 (81%), 2.93 (84%). DLCO unc 26.16 (77%), cor 28.18 (83%).   IMAGES: MRI C-spine 02/01/22: IMPRESSION: - Interval C3-C4 ACDF revision and evacuation of an epidural collection. Improved spinal canal patency with persistent moderate spinal canal stenosis, focal kinking of the cord with mild abnormal cord signal at this level suggestive of congestive myelopathy. Similar degree of bilateral neural foraminal stenosis at this level. -  Unchanged mild spinal canal narrowing and moderate to severe bilateral neural foraminal stenosis at C4-C5. - Stable postoperative appearance from C5-C6 and C6-C7 - Unchanged moderate left neural foraminal stenosis at C2-C3.   EKG: 02/18/22 ([facility]): NSR, frequent PVCs. TWI lead III.    CV: TEE 04/27/2018 (Intra-op CABG):  Septum: No Patent Foramen Ovale present.   Left atrium: Patent foramen ovale not present.   Left atrium: Cavity is dilated and dilated.   Aortic valve: The valve is trileaflet. Mild valve thickening present. Mild valve calcification present. Mildly decreased leaflet separation. No stenosis. Mild regurgitation. No AV vegetation.   Mitral valve: No leaflet thickening and calcification present. Mild regurgitation.   Right ventricle: Normal cavity size and wall thickness. Mild to moderately reduced systolic function. No thrombus present. No mass present. POST-OP: Normal RV cavity size. And systolic function normal.   Pulmonic valve: Trace regurgitation.   Left ventricle: Cavity is small.  LV systolic function is mildly to moderately reduced with an EF of 40-45%.  Wall motion is abnormal.  Anterior wall motion.  Anteroseptal wall motion.  Anterolateral wall motion. POST-OP: LV cavity is normal and ejections fraction is low normal.    TTE 04/25/2018 (pre-CABG): - Left ventricle: The cavity size was normal. Systolic function was    mildly reduced. The estimated ejection fraction was in the range    of 45% to 50%. Hypokinesis of the inferolateral, anterior and    anteroseptal, and anterolateral myocardium. Doppler parameters    are consistent with abnormal left ventricular relaxation (grade 1    diastolic dysfunction). Doppler parameters are consistent with    high ventricular filling pressure.  - Aortic valve: Transvalvular velocity was within the normal range.    There was no stenosis. There was mild regurgitation directed    towards the mitral anterior  leaflet. Regurgitation pressure    half-time: 887 ms.  - Aorta: Aortic root dimension: 39 mm (ED).  - Ascending aorta: The ascending aorta was mildly dilated.  - Mitral valve: Transvalvular velocity was within the normal range.    There was no evidence for stenosis. There was trivial    regurgitation.  - Left atrium: The atrium was severely dilated.  - Right ventricle: The cavity size was normal. Wall thickness was    normal. Systolic function was normal.  - Tricuspid valve: There was trivial regurgitation.  - Pulmonary arteries: Systolic pressure was within the normal    range.    US Carotid 04/26/18: Final Interpretation:  - Right Carotid: Velocities in the right ICA are consistent with a 1-39%  stenosis.  - Left Carotid: Velocities in the left ICA are consistent with a 1-39%  stenosis.  - Vertebrals:  Bilateral vertebral arteries demonstrate antegrade flow.  - Subclavians: Normal flow hemodynamics were seen in bilateral subclavian               arteries.    Last cardiac cath was on 04/24/18 showing severe 3V CAD with occlusion of both prior stented segments, and was  referred for CABG.  Past Medical History:  Diagnosis Date   Allergy    Carpal tunnel syndrome    Chronic back pain    Chronic combined systolic and diastolic congestive heart failure (HCC)    Chronic ITP (idiopathic thrombocytopenia) (HCC) 05/25/2015   Coronary artery disease    Coronary artery disease involving native coronary artery of native heart with angina pectoris (HCC)    Degenerative disc disease    GERD (gastroesophageal reflux disease)    History of hiatal hernia    History of thrombocytopenia    Hypercholesteremia    Hypertension    Hypothyroid    Lumbar pain    Myocardial infarction ([place]) 2009   Obesity    Pneumonia    around age 16   S/P CABG x 3 04/27/2018   LIMA to LAD SVG to OM1 SVG to OM2   Shortness of breath dyspnea     Past Surgical History:  Procedure Laterality Date    ANTERIOR CERVICAL DECOMP/DISCECTOMY FUSION N/A 07/19/2021   Procedure: CERVICAL THREE-FOUR ANTERIOR CERVICAL DECOMPRESSION/DISCECTOMY FUSION;  Surgeon: [name], MD;  Location: [facility];  Service: Neurosurgery;  Laterality: N/A;   ANTERIOR CERVICAL DECOMP/DISCECTOMY FUSION N/A 07/22/2021   Procedure: ANTERIOR CERVICAL DECOMPRESSION/DISCECTOMY REVISON FUSION  WITH EVACUATION OF EPIDURAL HEMATOMA;  Surgeon: [name], DO;  Location: [facility];  Service: Neurosurgery;  Laterality: N/A;   BACK SURGERY     5 lumbas disc with cervical and lumbar fusions   BIOPSY N/A 03/06/2014   Procedure: ESOPHAGEAL BIOPSIES;  Surgeon: [name], MD;  Location: [facility];  Service: Endoscopy;  Laterality: N/A;   COLONOSCOPY     COLONOSCOPY WITH PROPOFOL N/A 03/06/2014   Procedure: COLONOSCOPY WITH PROPOFOL;  Surgeon: [name], MD;  Location: [facility];  Service: Endoscopy;  Laterality: N/A;  in cecum at 0807; total withdrawal time 9 minutes   CORONARY ANGIOPLASTY WITH STENT PLACEMENT     2000, and 2004 has 3 stents   CORONARY ARTERY BYPASS GRAFT N/A 04/27/2018   Procedure: CORONARY ARTERY BYPASS GRAFTING (CABG) x Three , using left internal mammary artery and right leg greater saphenous vein;  Surgeon: [name], MD;  Location: [place];  Service: Open Heart Surgery;  Laterality: N/A;   ESOPHAGOGASTRODUODENOSCOPY (EGD) WITH PROPOFOL N/A 03/06/2014   Procedure: ESOPHAGOGASTRODUODENOSCOPY (EGD) WITH PROPOFOL;  Surgeon: [name], MD;  Location: [facility];  Service: Endoscopy;  Laterality: N/A;   EYE SURGERY Right 2013   "lazy eye"   JOINT REPLACEMENT Left 2011   LEFT HEART CATH AND CORONARY ANGIOGRAPHY N/A 04/24/2018   Procedure: LEFT HEART CATH AND CORONARY ANGIOGRAPHY;  Surgeon: [name], MD;  Location: [facility];  Service: Cardiovascular;  Laterality: N/A;   LUMBAR FUSION  11/01/2007   MALONEY DILATION N/A 03/06/2014   Procedure: MALONEY DILATION 54 french;  Surgeon: [name], MD;  Location: [facility];  Service: Endoscopy;  Laterality: N/A;   NECK SURGERY     TEE WITHOUT CARDIOVERSION N/A 04/27/2018   Procedure: TRANSESOPHAGEAL ECHOCARDIOGRAM (TEE);  Surgeon: [name], MD;  Location: [facility];  Service: Open Heart Surgery;  Laterality: N/A;    MEDICATIONS:  acetaminophen (TYLENOL) 325 MG tablet   albuterol (VENTOLIN HFA) 108 (90 Base) MCG/ACT inhaler   alendronate (FOSAMAX) 70 MG tablet   baclofen (LIORESAL) 10 MG tablet   cholecalciferol (VITAMIN D3) 25 MCG (1000 UNIT) tablet   enoxaparin (LOVENOX) 60 MG/0.6ML injection   ezetimibe (ZETIA)  10 MG tablet   gabapentin (NEURONTIN) 600 MG tablet   levothyroxine (SYNTHROID) 150 MCG tablet   lisinopril (ZESTRIL) 20 MG tablet   methocarbamol (ROBAXIN) 500 MG tablet   metoprolol tartrate (LOPRESSOR) 50 MG tablet   Multiple Vitamin (MULTIVITAMIN) capsule   nitroGLYCERIN (NITROSTAT) 0.4 MG SL tablet   pantoprazole (PROTONIX) 40 MG tablet   polycarbophil (FIBERCON) 625 MG tablet   potassium chloride (KLOR-CON M) 10 MEQ tablet   rosuvastatin (CRESTOR) 40 MG tablet   sertraline (ZOLOFT) 100 MG tablet   tamsulosin (FLOMAX) 0.4 MG CAPS capsule   torsemide (DEMADEX) 20 MG tablet   Vitamin D, Ergocalciferol, (DRISDOL) 1.25 MG (50000 UNIT) CAPS capsule   No current facility-administered medications for this encounter.  Lovenox scheduled 08/24/21-11/20/21 per PM&R.   Myra Gianotti, PA-C Surgical Short Stay/Anesthesiology [facility] Phone [phone] [facility] Phone [phone] 03/24/2022 4:25 PM

## 2022-03-24 NOTE — Anesthesia Preprocedure Evaluation (Addendum)
Anesthesia Evaluation  Patient identified by MRN, date of birth, ID band Patient awake    Reviewed: Allergy & Precautions, NPO status , Patient's Chart, lab work & pertinent test results  Airway Mallampati: I  TM Distance: >3 FB Neck ROM: Full    Dental  (+) Edentulous Upper, Edentulous Lower   Pulmonary former smoker,     + decreased breath sounds      Cardiovascular hypertension, Pt. on medications and Pt. on home beta blockers + CAD, + Past MI, + Cardiac Stents and +CHF   Rhythm:Regular Rate:Normal     Neuro/Psych  Neuromuscular disease negative psych ROS   GI/Hepatic Neg liver ROS, hiatal hernia, GERD  Medicated,  Endo/Other  Hypothyroidism   Renal/GU negative Renal ROS     Musculoskeletal  (+) Arthritis ,   Abdominal Normal abdominal exam  (+)   Peds  Hematology  (+) Blood dyscrasia, anemia ,   Anesthesia Other Findings Bilateral UE & LE numbness/tingling  Reproductive/Obstetrics                           Anesthesia Physical Anesthesia Plan  ASA: 3  Anesthesia Plan: General   Post-op Pain Management:    Induction: Intravenous  PONV Risk Score and Plan: 3 and Dexamethasone, Ondansetron and Midazolam  Airway Management Planned: Oral ETT and Video Laryngoscope Planned  Additional Equipment: None  Intra-op Plan:   Post-operative Plan: Extubation in OR  Informed Consent:   Plan Discussed with: CRNA  Anesthesia Plan Comments: (PAT note written 03/24/2022 by Myra Gianotti, PA-C. PLT count 108K 03/23/22.  )       Anesthesia Quick Evaluation

## 2022-03-25 ENCOUNTER — Other Ambulatory Visit: Payer: Self-pay | Admitting: Family Medicine

## 2022-04-01 ENCOUNTER — Inpatient Hospital Stay (HOSPITAL_COMMUNITY): Payer: Medicare Other | Admitting: Vascular Surgery

## 2022-04-01 ENCOUNTER — Encounter (HOSPITAL_COMMUNITY)
Admission: RE | Disposition: A | Discharge status: Home Health | Payer: Self-pay | Source: Home / Self Care | Attending: Neurosurgery

## 2022-04-01 ENCOUNTER — Inpatient Hospital Stay (HOSPITAL_COMMUNITY): Payer: Medicare Other

## 2022-04-01 ENCOUNTER — Other Ambulatory Visit: Payer: Self-pay

## 2022-04-01 ENCOUNTER — Inpatient Hospital Stay (HOSPITAL_COMMUNITY)
Admission: RE | Admit: 2022-04-01 | Discharge: 2022-04-03 | DRG: 472 | Disposition: A | Discharge status: Home Health | Payer: Medicare Other | Attending: Neurosurgery | Admitting: Neurosurgery

## 2022-04-01 ENCOUNTER — Inpatient Hospital Stay (HOSPITAL_COMMUNITY): Payer: Medicare Other | Admitting: Anesthesiology

## 2022-04-01 ENCOUNTER — Encounter (HOSPITAL_COMMUNITY): Payer: Self-pay | Admitting: Neurosurgery

## 2022-04-01 DIAGNOSIS — Z951 Presence of aortocoronary bypass graft: Secondary | ICD-10-CM

## 2022-04-01 DIAGNOSIS — G992 Myelopathy in diseases classified elsewhere: Principal | ICD-10-CM | POA: Diagnosis present

## 2022-04-01 DIAGNOSIS — E7800 Pure hypercholesterolemia, unspecified: Secondary | ICD-10-CM | POA: Diagnosis not present

## 2022-04-01 DIAGNOSIS — I509 Heart failure, unspecified: Secondary | ICD-10-CM | POA: Diagnosis not present

## 2022-04-01 DIAGNOSIS — Z79890 Hormone replacement therapy (postmenopausal): Secondary | ICD-10-CM | POA: Diagnosis not present

## 2022-04-01 DIAGNOSIS — M4712 Other spondylosis with myelopathy, cervical region: Secondary | ICD-10-CM | POA: Diagnosis present

## 2022-04-01 DIAGNOSIS — Z955 Presence of coronary angioplasty implant and graft: Secondary | ICD-10-CM | POA: Diagnosis not present

## 2022-04-01 DIAGNOSIS — I110 Hypertensive heart disease with heart failure: Secondary | ICD-10-CM | POA: Diagnosis not present

## 2022-04-01 DIAGNOSIS — Z79899 Other long term (current) drug therapy: Secondary | ICD-10-CM

## 2022-04-01 DIAGNOSIS — Z87891 Personal history of nicotine dependence: Secondary | ICD-10-CM | POA: Diagnosis not present

## 2022-04-01 DIAGNOSIS — K219 Gastro-esophageal reflux disease without esophagitis: Secondary | ICD-10-CM | POA: Diagnosis present

## 2022-04-01 DIAGNOSIS — M4322 Fusion of spine, cervical region: Secondary | ICD-10-CM | POA: Diagnosis not present

## 2022-04-01 DIAGNOSIS — I252 Old myocardial infarction: Secondary | ICD-10-CM

## 2022-04-01 DIAGNOSIS — I10 Essential (primary) hypertension: Secondary | ICD-10-CM | POA: Diagnosis not present

## 2022-04-01 DIAGNOSIS — M4802 Spinal stenosis, cervical region: Principal | ICD-10-CM | POA: Diagnosis present

## 2022-04-01 DIAGNOSIS — Z7983 Long term (current) use of bisphosphonates: Secondary | ICD-10-CM | POA: Diagnosis not present

## 2022-04-01 DIAGNOSIS — G9589 Other specified diseases of spinal cord: Secondary | ICD-10-CM | POA: Diagnosis not present

## 2022-04-01 DIAGNOSIS — E039 Hypothyroidism, unspecified: Secondary | ICD-10-CM

## 2022-04-01 DIAGNOSIS — G959 Disease of spinal cord, unspecified: Secondary | ICD-10-CM | POA: Diagnosis not present

## 2022-04-01 DIAGNOSIS — I2510 Atherosclerotic heart disease of native coronary artery without angina pectoris: Secondary | ICD-10-CM | POA: Diagnosis present

## 2022-04-01 DIAGNOSIS — I5042 Chronic combined systolic (congestive) and diastolic (congestive) heart failure: Secondary | ICD-10-CM | POA: Diagnosis present

## 2022-04-01 DIAGNOSIS — M960 Pseudarthrosis after fusion or arthrodesis: Secondary | ICD-10-CM | POA: Diagnosis not present

## 2022-04-01 HISTORY — PX: POSTERIOR CERVICAL FUSION/FORAMINOTOMY: SHX5038

## 2022-04-01 SURGERY — POSTERIOR CERVICAL FUSION/FORAMINOTOMY LEVEL 2
Anesthesia: General | Site: Neck

## 2022-04-01 MED ORDER — FENTANYL CITRATE (PF) 250 MCG/5ML IJ SOLN
INTRAMUSCULAR | Status: AC
  Filled 2022-04-01: qty 5

## 2022-04-01 MED ORDER — CEFAZOLIN SODIUM-DEXTROSE 2-4 GM/100ML-% IV SOLN: 2.0000 g | Freq: Three times a day (TID) | INTRAVENOUS | Status: DC

## 2022-04-01 MED ORDER — LISINOPRIL 20 MG PO TABS
20.0000 mg | ORAL_TABLET | Freq: Every day | ORAL | Status: DC
  Administered 2022-04-02 – 2022-04-03 (×2): 20 mg via ORAL
  Filled 2022-04-01 (×2): qty 1

## 2022-04-01 MED ORDER — ROCURONIUM BROMIDE 10 MG/ML (PF) SYRINGE
PREFILLED_SYRINGE | INTRAVENOUS | Status: DC | PRN
  Administered 2022-04-01: 60 mg via INTRAVENOUS
  Administered 2022-04-01: 40 mg via INTRAVENOUS

## 2022-04-01 MED ORDER — SODIUM CHLORIDE 0.9 % IV SOLN: 250.0000 mL | INTRAVENOUS | Status: DC

## 2022-04-01 MED ORDER — PROPOFOL 10 MG/ML IV BOLUS
INTRAVENOUS | Status: AC
  Filled 2022-04-01: qty 20

## 2022-04-01 MED ORDER — FENTANYL CITRATE (PF) 100 MCG/2ML IJ SOLN
25.0000 ug | INTRAMUSCULAR | Status: AC | PRN
  Administered 2022-04-01 (×6): 25 ug via INTRAVENOUS

## 2022-04-01 MED ORDER — BACLOFEN 10 MG PO TABS
10.0000 mg | ORAL_TABLET | Freq: Three times a day (TID) | ORAL | Status: DC
  Administered 2022-04-01 – 2022-04-03 (×5): 10 mg via ORAL
  Filled 2022-04-01 (×5): qty 1

## 2022-04-01 MED ORDER — LIDOCAINE 2% (20 MG/ML) 5 ML SYRINGE
INTRAMUSCULAR | Status: DC | PRN
  Administered 2022-04-01: 40 mg via INTRAVENOUS

## 2022-04-01 MED ORDER — LIDOCAINE-EPINEPHRINE 1 %-1:100000 IJ SOLN
INTRAMUSCULAR | Status: DC | PRN
  Administered 2022-04-01: 10 mL

## 2022-04-01 MED ORDER — ACETAMINOPHEN 325 MG PO TABS: 650.0000 mg | ORAL_TABLET | ORAL | Status: DC | PRN

## 2022-04-01 MED ORDER — HYDROCODONE-ACETAMINOPHEN 5-325 MG PO TABS
2.0000 | ORAL_TABLET | ORAL | Status: DC | PRN
  Administered 2022-04-01 – 2022-04-03 (×8): 2 via ORAL
  Filled 2022-04-01 (×8): qty 2

## 2022-04-01 MED ORDER — BACITRACIN ZINC 500 UNIT/GM EX OINT
TOPICAL_OINTMENT | CUTANEOUS | Status: AC
  Filled 2022-04-01: qty 28.35

## 2022-04-01 MED ORDER — CHLORHEXIDINE GLUCONATE 0.12 % MT SOLN
15.0000 mL | Freq: Once | OROMUCOSAL | Status: AC
  Administered 2022-04-01: 15 mL via OROMUCOSAL
  Filled 2022-04-01: qty 15

## 2022-04-01 MED ORDER — ACETAMINOPHEN 325 MG PO TABS: 325.0000 mg | ORAL_TABLET | ORAL | Status: DC | PRN

## 2022-04-01 MED ORDER — ACETAMINOPHEN 10 MG/ML IV SOLN
INTRAVENOUS | Status: AC
  Filled 2022-04-01: qty 100

## 2022-04-01 MED ORDER — ADULT MULTIVITAMIN W/MINERALS CH
1.0000 | ORAL_TABLET | Freq: Every day | ORAL | Status: DC
  Administered 2022-04-02 – 2022-04-03 (×2): 1 via ORAL
  Filled 2022-04-01 (×2): qty 1

## 2022-04-01 MED ORDER — HYDROMORPHONE HCL 1 MG/ML IJ SOLN
0.5000 mg | INTRAMUSCULAR | Status: DC | PRN
  Administered 2022-04-01 – 2022-04-03 (×5): 0.5 mg via INTRAVENOUS
  Filled 2022-04-01 (×4): qty 0.5

## 2022-04-01 MED ORDER — GABAPENTIN 600 MG PO TABS
600.0000 mg | ORAL_TABLET | Freq: Three times a day (TID) | ORAL | Status: DC
  Administered 2022-04-01 – 2022-04-03 (×6): 600 mg via ORAL
  Filled 2022-04-01 (×6): qty 1

## 2022-04-01 MED ORDER — MENTHOL 3 MG MT LOZG: 1.0000 | LOZENGE | OROMUCOSAL | Status: DC | PRN

## 2022-04-01 MED ORDER — PANTOPRAZOLE SODIUM 40 MG PO TBEC
40.0000 mg | DELAYED_RELEASE_TABLET | Freq: Every day | ORAL | Status: DC
  Administered 2022-04-02 – 2022-04-03 (×2): 40 mg via ORAL
  Filled 2022-04-01 (×2): qty 1

## 2022-04-01 MED ORDER — EPHEDRINE SULFATE-NACL 50-0.9 MG/10ML-% IV SOSY
PREFILLED_SYRINGE | INTRAVENOUS | Status: DC | PRN
  Administered 2022-04-01 (×2): 10 mg via INTRAVENOUS

## 2022-04-01 MED ORDER — 0.9 % SODIUM CHLORIDE (POUR BTL) OPTIME
TOPICAL | Status: DC | PRN
  Administered 2022-04-01: 1000 mL

## 2022-04-01 MED ORDER — ALBUTEROL SULFATE HFA 108 (90 BASE) MCG/ACT IN AERS: 2.0000 | INHALATION_SPRAY | Freq: Four times a day (QID) | RESPIRATORY_TRACT | Status: DC | PRN

## 2022-04-01 MED ORDER — SUGAMMADEX SODIUM 200 MG/2ML IV SOLN
INTRAVENOUS | Status: DC | PRN
  Administered 2022-04-01: 200 mg via INTRAVENOUS

## 2022-04-01 MED ORDER — OXYCODONE HCL 5 MG/5ML PO SOLN: 5.0000 mg | Freq: Once | ORAL | Status: DC | PRN

## 2022-04-01 MED ORDER — ACETAMINOPHEN 10 MG/ML IV SOLN
1000.0000 mg | Freq: Once | INTRAVENOUS | Status: DC | PRN
  Administered 2022-04-01: 1000 mg via INTRAVENOUS

## 2022-04-01 MED ORDER — VITAMIN D 25 MCG (1000 UNIT) PO TABS
1000.0000 [IU] | ORAL_TABLET | Freq: Every day | ORAL | Status: DC
  Administered 2022-04-02 – 2022-04-03 (×2): 1000 [IU] via ORAL
  Filled 2022-04-01 (×2): qty 1

## 2022-04-01 MED ORDER — THROMBIN 20000 UNITS EX SOLR
CUTANEOUS | Status: AC
  Filled 2022-04-01: qty 20000

## 2022-04-01 MED ORDER — ACETAMINOPHEN 500 MG PO TABS
1000.0000 mg | ORAL_TABLET | Freq: Four times a day (QID) | ORAL | Status: DC | PRN
Start: 2022-04-01 — End: 2022-04-03

## 2022-04-01 MED ORDER — ACETAMINOPHEN 650 MG RE SUPP: 650.0000 mg | RECTAL | Status: DC | PRN

## 2022-04-01 MED ORDER — CYCLOBENZAPRINE HCL 10 MG PO TABS
10.0000 mg | ORAL_TABLET | Freq: Three times a day (TID) | ORAL | Status: DC | PRN
  Administered 2022-04-01 – 2022-04-03 (×6): 10 mg via ORAL
  Filled 2022-04-01 (×6): qty 1

## 2022-04-01 MED ORDER — ACETAMINOPHEN 160 MG/5ML PO SOLN: 325.0000 mg | ORAL | Status: DC | PRN

## 2022-04-01 MED ORDER — CALCIUM POLYCARBOPHIL 625 MG PO TABS
625.0000 mg | ORAL_TABLET | Freq: Every day | ORAL | Status: DC
  Administered 2022-04-02: 625 mg via ORAL
  Filled 2022-04-01 (×3): qty 1

## 2022-04-01 MED ORDER — ROSUVASTATIN CALCIUM 20 MG PO TABS
40.0000 mg | ORAL_TABLET | Freq: Every day | ORAL | Status: DC
  Administered 2022-04-02 – 2022-04-03 (×2): 40 mg via ORAL
  Filled 2022-04-01 (×2): qty 2

## 2022-04-01 MED ORDER — POTASSIUM CHLORIDE CRYS ER 10 MEQ PO TBCR: 10.0000 meq | EXTENDED_RELEASE_TABLET | Freq: Once | ORAL | Status: DC

## 2022-04-01 MED ORDER — ONDANSETRON HCL 4 MG/2ML IJ SOLN: 4.0000 mg | Freq: Four times a day (QID) | INTRAMUSCULAR | Status: DC | PRN

## 2022-04-01 MED ORDER — PANTOPRAZOLE SODIUM 40 MG IV SOLR: 40.0000 mg | Freq: Every day | INTRAVENOUS | Status: DC

## 2022-04-01 MED ORDER — CHLORHEXIDINE GLUCONATE CLOTH 2 % EX PADS: 6.0000 | MEDICATED_PAD | Freq: Once | CUTANEOUS | Status: DC

## 2022-04-01 MED ORDER — ONDANSETRON HCL 4 MG PO TABS: 4.0000 mg | ORAL_TABLET | Freq: Four times a day (QID) | ORAL | Status: DC | PRN

## 2022-04-01 MED ORDER — ALENDRONATE SODIUM 70 MG PO TABS: 70.0000 mg | ORAL_TABLET | ORAL | Status: DC

## 2022-04-01 MED ORDER — THROMBIN 5000 UNITS EX SOLR
CUTANEOUS | Status: AC
  Filled 2022-04-01: qty 5000

## 2022-04-01 MED ORDER — TORSEMIDE 20 MG PO TABS
20.0000 mg | ORAL_TABLET | Freq: Every day | ORAL | Status: DC
  Administered 2022-04-02 – 2022-04-03 (×2): 20 mg via ORAL
  Filled 2022-04-01 (×2): qty 1

## 2022-04-01 MED ORDER — METOPROLOL TARTRATE 50 MG PO TABS
50.0000 mg | ORAL_TABLET | Freq: Two times a day (BID) | ORAL | Status: DC
  Administered 2022-04-01 – 2022-04-03 (×4): 50 mg via ORAL
  Filled 2022-04-01 (×4): qty 1

## 2022-04-01 MED ORDER — ORAL CARE MOUTH RINSE: 15.0000 mL | Freq: Once | OROMUCOSAL | Status: AC

## 2022-04-01 MED ORDER — MULTIVITAMINS PO CAPS
1.0000 | ORAL_CAPSULE | Freq: Every day | ORAL | Status: DC
  Filled 2022-04-01: qty 1

## 2022-04-01 MED ORDER — PHENYLEPHRINE HCL-NACL 20-0.9 MG/250ML-% IV SOLN
INTRAVENOUS | Status: DC | PRN
  Administered 2022-04-01: 20 ug/min via INTRAVENOUS

## 2022-04-01 MED ORDER — DEXAMETHASONE SODIUM PHOSPHATE 10 MG/ML IJ SOLN
INTRAMUSCULAR | Status: DC | PRN
  Administered 2022-04-01: 10 mg via INTRAVENOUS

## 2022-04-01 MED ORDER — LIDOCAINE-EPINEPHRINE 1 %-1:100000 IJ SOLN
INTRAMUSCULAR | Status: AC
  Filled 2022-04-01: qty 1

## 2022-04-01 MED ORDER — METHOCARBAMOL 500 MG PO TABS: 500.0000 mg | ORAL_TABLET | Freq: Two times a day (BID) | ORAL | Status: DC | PRN

## 2022-04-01 MED ORDER — THROMBIN 20000 UNITS EX SOLR: CUTANEOUS | Status: DC | PRN

## 2022-04-01 MED ORDER — SODIUM CHLORIDE 0.9% FLUSH
3.0000 mL | Freq: Two times a day (BID) | INTRAVENOUS | Status: DC
  Administered 2022-04-01 – 2022-04-02 (×3): 3 mL via INTRAVENOUS

## 2022-04-01 MED ORDER — FENTANYL CITRATE (PF) 100 MCG/2ML IJ SOLN
INTRAMUSCULAR | Status: AC
  Filled 2022-04-01: qty 2

## 2022-04-01 MED ORDER — ALBUMIN HUMAN 5 % IV SOLN: INTRAVENOUS | Status: DC | PRN

## 2022-04-01 MED ORDER — BUPIVACAINE HCL (PF) 0.25 % IJ SOLN
INTRAMUSCULAR | Status: AC
  Filled 2022-04-01: qty 30

## 2022-04-01 MED ORDER — BUPIVACAINE HCL (PF) 0.25 % IJ SOLN
INTRAMUSCULAR | Status: DC | PRN
  Administered 2022-04-01: 10 mL

## 2022-04-01 MED ORDER — LACTATED RINGERS IV SOLN: INTRAVENOUS | Status: DC

## 2022-04-01 MED ORDER — AMISULPRIDE (ANTIEMETIC) 5 MG/2ML IV SOLN: 10.0000 mg | Freq: Once | INTRAVENOUS | Status: DC | PRN

## 2022-04-01 MED ORDER — EZETIMIBE 10 MG PO TABS
10.0000 mg | ORAL_TABLET | Freq: Every day | ORAL | Status: DC
  Administered 2022-04-02 – 2022-04-03 (×2): 10 mg via ORAL
  Filled 2022-04-01 (×2): qty 1

## 2022-04-01 MED ORDER — HYDROMORPHONE HCL 1 MG/ML IJ SOLN
INTRAMUSCULAR | Status: AC
  Filled 2022-04-01: qty 1

## 2022-04-01 MED ORDER — ALUM & MAG HYDROXIDE-SIMETH 200-200-20 MG/5ML PO SUSP: 30.0000 mL | Freq: Four times a day (QID) | ORAL | Status: DC | PRN

## 2022-04-01 MED ORDER — ONDANSETRON HCL 4 MG/2ML IJ SOLN
INTRAMUSCULAR | Status: DC | PRN
  Administered 2022-04-01: 4 mg via INTRAVENOUS

## 2022-04-01 MED ORDER — VANCOMYCIN HCL IN DEXTROSE 1-5 GM/200ML-% IV SOLN
1000.0000 mg | INTRAVENOUS | Status: AC
  Administered 2022-04-01: 1000 mg via INTRAVENOUS
  Filled 2022-04-01: qty 200

## 2022-04-01 MED ORDER — FENTANYL CITRATE (PF) 250 MCG/5ML IJ SOLN
INTRAMUSCULAR | Status: DC | PRN
  Administered 2022-04-01: 50 ug via INTRAVENOUS
  Administered 2022-04-01 (×3): 25 ug via INTRAVENOUS
  Administered 2022-04-01: 100 ug via INTRAVENOUS

## 2022-04-01 MED ORDER — PHENYLEPHRINE 80 MCG/ML (10ML) SYRINGE FOR IV PUSH (FOR BLOOD PRESSURE SUPPORT)
PREFILLED_SYRINGE | INTRAVENOUS | Status: DC | PRN
  Administered 2022-04-01 (×2): 160 ug via INTRAVENOUS

## 2022-04-01 MED ORDER — OXYCODONE HCL 5 MG PO TABS: 5.0000 mg | ORAL_TABLET | Freq: Once | ORAL | Status: DC | PRN

## 2022-04-01 MED ORDER — LEVOTHYROXINE SODIUM 150 MCG PO TABS
150.0000 ug | ORAL_TABLET | Freq: Every day | ORAL | Status: DC
  Administered 2022-04-02 – 2022-04-03 (×2): 150 ug via ORAL
  Filled 2022-04-01 (×2): qty 1

## 2022-04-01 MED ORDER — PHENOL 1.4 % MT LIQD: 1.0000 | OROMUCOSAL | Status: DC | PRN

## 2022-04-01 MED ORDER — BACITRACIN ZINC 500 UNIT/GM EX OINT
TOPICAL_OINTMENT | CUTANEOUS | Status: DC | PRN
  Administered 2022-04-01: 1 via TOPICAL

## 2022-04-01 MED ORDER — TAMSULOSIN HCL 0.4 MG PO CAPS
0.4000 mg | ORAL_CAPSULE | Freq: Every day | ORAL | Status: DC
  Administered 2022-04-02 – 2022-04-03 (×2): 0.4 mg via ORAL
  Filled 2022-04-01 (×2): qty 1

## 2022-04-01 MED ORDER — SODIUM CHLORIDE 0.9% FLUSH: 3.0000 mL | INTRAVENOUS | Status: DC | PRN

## 2022-04-01 MED ORDER — PROPOFOL 10 MG/ML IV BOLUS
INTRAVENOUS | Status: DC | PRN
  Administered 2022-04-01: 130 mg via INTRAVENOUS

## 2022-04-01 MED ORDER — SERTRALINE HCL 100 MG PO TABS
100.0000 mg | ORAL_TABLET | Freq: Every day | ORAL | Status: DC
  Administered 2022-04-02 – 2022-04-03 (×2): 100 mg via ORAL
  Filled 2022-04-01 (×2): qty 1

## 2022-04-01 MED ORDER — NITROGLYCERIN 0.4 MG SL SUBL: 0.4000 mg | SUBLINGUAL_TABLET | SUBLINGUAL | Status: DC | PRN

## 2022-04-01 SURGICAL SUPPLY — 70 items
ADH SKN CLS APL DERMABOND .7 (GAUZE/BANDAGES/DRESSINGS) ×1
APL SKNCLS STERI-STRIP NONHPOA (GAUZE/BANDAGES/DRESSINGS) ×2
BAG COUNTER SPONGE SURGICOUNT (BAG) ×3 IMPLANT
BAG SPNG CNTER NS LX DISP (BAG) ×1
BAND INSRT 18 STRL LF DISP RB (MISCELLANEOUS)
BAND RUBBER #18 3X1/16 STRL (MISCELLANEOUS) IMPLANT
BENZOIN TINCTURE PRP APPL 2/3 (GAUZE/BANDAGES/DRESSINGS) ×6 IMPLANT
BIT DRILL 3.5 SHORT (BIT) ×1 IMPLANT
BLADE CLIPPER SURG (BLADE) ×3 IMPLANT
BLADE SURG 11 STRL SS (BLADE) ×3 IMPLANT
BONE VIVIGEN FORMABLE 1.3CC (Bone Implant) ×4 IMPLANT
BUR MATCHSTICK NEURO 3.0 LAGG (BURR) ×3 IMPLANT
CANISTER SUCT 3000ML PPV (MISCELLANEOUS) ×3 IMPLANT
CAP LOCKING (Cap) IMPLANT
CARTRIDGE OIL MAESTRO DRILL (MISCELLANEOUS) ×2 IMPLANT
DECANTER SPIKE VIAL GLASS SM (MISCELLANEOUS) ×3 IMPLANT
DERMABOND ADVANCED (GAUZE/BANDAGES/DRESSINGS) ×1
DERMABOND ADVANCED .7 DNX12 (GAUZE/BANDAGES/DRESSINGS) ×2 IMPLANT
DIFFUSER DRILL AIR PNEUMATIC (MISCELLANEOUS) ×3 IMPLANT
DRAPE C-ARM 42X72 X-RAY (DRAPES) IMPLANT
DRAPE LAPAROTOMY 100X72 PEDS (DRAPES) ×3 IMPLANT
DRAPE MICROSCOPE LEICA (MISCELLANEOUS) IMPLANT
DRAPE SURG 17X23 STRL (DRAPES) ×12 IMPLANT
DURAPREP 26ML APPLICATOR (WOUND CARE) ×3 IMPLANT
ELECT REM PT RETURN 9FT ADLT (ELECTROSURGICAL) ×2
ELECTRODE REM PT RTRN 9FT ADLT (ELECTROSURGICAL) ×2 IMPLANT
GAUZE 4X4 16PLY ~~LOC~~+RFID DBL (SPONGE) IMPLANT
GAUZE SPONGE 4X4 12PLY STRL (GAUZE/BANDAGES/DRESSINGS) ×3 IMPLANT
GLOVE BIO SURGEON STRL SZ7 (GLOVE) IMPLANT
GLOVE BIO SURGEON STRL SZ8 (GLOVE) ×3 IMPLANT
GLOVE BIOGEL PI IND STRL 7.0 (GLOVE) IMPLANT
GLOVE BIOGEL PI INDICATOR 7.0 (GLOVE)
GLOVE EXAM NITRILE XL STR (GLOVE) IMPLANT
GLOVE INDICATOR 8.5 STRL (GLOVE) ×3 IMPLANT
GLOVE SURG UNDER POLY LF SZ8.5 (GLOVE) ×3 IMPLANT
GOWN STRL REUS W/ TWL LRG LVL3 (GOWN DISPOSABLE) IMPLANT
GOWN STRL REUS W/ TWL XL LVL3 (GOWN DISPOSABLE) ×2 IMPLANT
GOWN STRL REUS W/TWL 2XL LVL3 (GOWN DISPOSABLE) IMPLANT
GOWN STRL REUS W/TWL LRG LVL3 (GOWN DISPOSABLE)
GOWN STRL REUS W/TWL XL LVL3 (GOWN DISPOSABLE) ×2
GRAFT BNE MATRIX VG FRMBL SM 1 (Bone Implant) IMPLANT
IMPL QUARTEX 3.5X12MM (Neuro Prosthesis/Implant) IMPLANT
IMPLANT QUARTEX 3.5X12MM (Neuro Prosthesis/Implant) ×12 IMPLANT
KIT BASIN OR (CUSTOM PROCEDURE TRAY) ×3 IMPLANT
KIT TURNOVER KIT B (KITS) ×3 IMPLANT
LOCKING CAP (Cap) ×12 IMPLANT
MARKER SKIN DUAL TIP RULER LAB (MISCELLANEOUS) ×3 IMPLANT
NDL HYPO 25X1 1.5 SAFETY (NEEDLE) ×2 IMPLANT
NDL SPNL 20GX3.5 QUINCKE YW (NEEDLE) ×2 IMPLANT
NEEDLE HYPO 25X1 1.5 SAFETY (NEEDLE) ×2 IMPLANT
NEEDLE SPNL 20GX3.5 QUINCKE YW (NEEDLE) ×2 IMPLANT
NS IRRIG 1000ML POUR BTL (IV SOLUTION) ×3 IMPLANT
OIL CARTRIDGE MAESTRO DRILL (MISCELLANEOUS) ×2
PACK LAMINECTOMY NEURO (CUSTOM PROCEDURE TRAY) ×3 IMPLANT
PAD ARMBOARD 7.5X6 YLW CONV (MISCELLANEOUS) ×9 IMPLANT
PIN MAYFIELD SKULL DISP (PIN) ×3 IMPLANT
ROD CVD 4.0X40MM (Rod) ×2 IMPLANT
SPONGE SURGIFOAM ABS GEL 100 (HEMOSTASIS) ×3 IMPLANT
SPONGE T-LAP 4X18 ~~LOC~~+RFID (SPONGE) IMPLANT
STRIP CLOSURE SKIN 1/2X4 (GAUZE/BANDAGES/DRESSINGS) ×3 IMPLANT
SUT ETHILON 4 0 PS 2 18 (SUTURE) IMPLANT
SUT VIC AB 0 CT1 18XCR BRD8 (SUTURE) ×2 IMPLANT
SUT VIC AB 0 CT1 8-18 (SUTURE) ×2
SUT VIC AB 2-0 CT1 18 (SUTURE) ×3 IMPLANT
SUT VIC AB 4-0 PS2 27 (SUTURE) ×3 IMPLANT
TAP SURG FOR SCREW GL 3.5 (ORTHOPEDIC DISPOSABLE SUPPLIES) ×1 IMPLANT
TOWEL GREEN STERILE (TOWEL DISPOSABLE) ×3 IMPLANT
TOWEL GREEN STERILE FF (TOWEL DISPOSABLE) ×3 IMPLANT
TRAY FOLEY MTR SLVR 16FR STAT (SET/KITS/TRAYS/PACK) IMPLANT
WATER STERILE IRR 1000ML POUR (IV SOLUTION) ×3 IMPLANT

## 2022-04-01 NOTE — H&P (Signed)
[name] is an 71 y.o. male.   Chief Complaint: Neck pain numbness tingling weakness in his hands HPI: 71 year old with cervical myelopathy status post anterior cervical decompression still with persistent numbness and weakness in his hands work-up has shown persistent posterior cord compression so I have recommended posterior cervical decompressive laminectomy I extensively gone over the risks and benefits of that operation with him as well as perioperative course expectations of outcome and alternatives to surgery he understood and agreed to proceed forward.  Past Medical History:  Diagnosis Date   Allergy    Carpal tunnel syndrome    Chronic back pain    Chronic combined systolic and diastolic congestive heart failure (HCC)    Chronic ITP (idiopathic thrombocytopenia) (HCC) 05/25/2015   Coronary artery disease    Coronary artery disease involving native coronary artery of native heart with angina pectoris (HCC)    Degenerative disc disease    Epidural hematoma (HCC)    C3-4 ACDF 07/19/21, presented 07/22/21 with BLE/UE weakness/epidural hematoma, s/p revision & hematoma evacuation   GERD (gastroesophageal reflux disease)    History of hiatal hernia    History of thrombocytopenia    Hypercholesteremia    Hypertension    Hypothyroid    Lumbar pain    Myocardial infarction ([place]) 2009   Obesity    Pneumonia    around age 46   S/P CABG x 3 04/27/2018   LIMA to LAD SVG to OM1 SVG to OM2   Shortness of breath dyspnea     Past Surgical History:  Procedure Laterality Date   ANTERIOR CERVICAL DECOMP/DISCECTOMY FUSION N/A 07/19/2021   Procedure: CERVICAL THREE-FOUR ANTERIOR CERVICAL DECOMPRESSION/DISCECTOMY FUSION;  Surgeon: [name], MD;  Location: [facility];  Service: Neurosurgery;  Laterality: N/A;   ANTERIOR CERVICAL DECOMP/DISCECTOMY FUSION N/A 07/22/2021   Procedure: ANTERIOR CERVICAL DECOMPRESSION/DISCECTOMY REVISON FUSION  WITH EVACUATION OF EPIDURAL HEMATOMA;  Surgeon: [name], DO;  Location: [facility];  Service: Neurosurgery;  Laterality: N/A;   BACK SURGERY     5 lumbas disc with cervical and lumbar fusions   BIOPSY N/A 03/06/2014   Procedure: ESOPHAGEAL BIOPSIES;  Surgeon: [name], MD;  Location: [facility];  Service: Endoscopy;  Laterality: N/A;   COLONOSCOPY     COLONOSCOPY WITH PROPOFOL N/A 03/06/2014   Procedure: COLONOSCOPY WITH PROPOFOL;  Surgeon: [name], MD;  Location: [facility];  Service: Endoscopy;  Laterality: N/A;  in cecum at 0807; total withdrawal time 9 minutes   CORONARY ANGIOPLASTY WITH STENT PLACEMENT     2000, and 2004 has 3 stents   CORONARY ARTERY BYPASS GRAFT N/A 04/27/2018   Procedure: CORONARY ARTERY BYPASS GRAFTING (CABG) x Three , using left internal mammary artery and right leg greater saphenous vein;  Surgeon: [name], MD;  Location: [facility];  Service: Open Heart Surgery;  Laterality: N/A;   ESOPHAGOGASTRODUODENOSCOPY (EGD) WITH PROPOFOL N/A 03/06/2014   Procedure: ESOPHAGOGASTRODUODENOSCOPY (EGD) WITH PROPOFOL;  Surgeon: [name], MD;  Location: [facility];  Service: Endoscopy;  Laterality: N/A;   EYE SURGERY Right 2013   "lazy eye"   JOINT REPLACEMENT Left 2011   LEFT HEART CATH AND CORONARY ANGIOGRAPHY N/A 04/24/2018   Procedure: LEFT HEART CATH AND CORONARY ANGIOGRAPHY;  Surgeon: [name], MD;  Location: [facility];  Service: Cardiovascular;  Laterality: N/A;   LUMBAR FUSION  11/01/2007   MALONEY DILATION N/A 03/06/2014   Procedure: MALONEY DILATION 54 french;  Surgeon: Mechele Dawley  Laural Golden, MD;  Location: [facility];  Service: Endoscopy;  Laterality: N/A;   NECK SURGERY     TEE WITHOUT CARDIOVERSION N/A 04/27/2018   Procedure: TRANSESOPHAGEAL ECHOCARDIOGRAM (TEE);  Surgeon: [name], MD;  Location: [facility];  Service: Open Heart Surgery;  Laterality: N/A;    Family History  Problem Relation Age of Onset   Heart attack Father    Heart disease Father    Colon cancer Maternal Grandmother     Esophageal cancer Neg Hx    Rectal cancer Neg Hx    Stomach cancer Neg Hx    Social History:  reports that he quit smoking about 30 years ago. His smoking use included cigarettes. He has a 40.00 pack-year smoking history. He has never used smokeless tobacco. He reports current alcohol use. He reports that he does not use drugs.  Allergies:  Allergies  Allergen Reactions   Penicillins Other (See Comments)    SYNCOPAL EPISODE  Has patient had a PCN reaction causing immediate rash, facial/tongue/throat swelling, SOB or LIGHTHEADEDNESS with HYPOTENSION [SYNCOPE] #  #  #  YES  #  #  #  Has patient had a PCN reaction causing severe rash involving mucus membranes or skin necrosis:No Has patient had a PCN reaction that required hospitalization:No Has patient had a PCN reaction occurring within the last 10 years:Yes    Sulfa Antibiotics Swelling    SWELLING REACTION UNSPECIFIED > PER PREVIOUS PMH   Zoloft [Sertraline Hcl] Other (See Comments)    Confusion Patient is taking Down     Medications Prior to Admission  Medication Sig Dispense Refill   acetaminophen (TYLENOL) 325 MG tablet Take 2 tablets (650 mg total) by mouth every 6 (six) hours as needed for mild pain (or Fever >/= 101). (Patient taking differently: Take 1,000 mg by mouth every 6 (six) hours as needed for mild pain (or Fever >/= 101).)     alendronate (FOSAMAX) 70 MG tablet Take 1 tablet (70 mg total) by mouth every 7 (seven) days. Take with a full glass of water on an empty stomach. 4 tablet 11   baclofen (LIORESAL) 10 MG tablet Take 0.5 tablets (5 mg total) by mouth 3 (three) times daily. X 1 weeks- then can increase to 10 mg 3x/day- for spasticity (Patient taking differently: Take 10 mg by mouth 3 (three) times daily. X 1 weeks- then can increase to 10 mg 3x/day- for spasticity) 90 each 5   cholecalciferol (VITAMIN D3) 25 MCG (1000 UNIT) tablet Take 1,000 Units by mouth daily.     ezetimibe (ZETIA) 10 MG tablet Take 1 tablet  (10 mg total) by mouth daily. 90 tablet 2   gabapentin (NEURONTIN) 600 MG tablet Take 1 tablet (600 mg total) by mouth 3 (three) times daily. 270 tablet 1   levothyroxine (SYNTHROID) 150 MCG tablet TAKE 1 TABLET BY MOUTH EVERY DAY 90 tablet 1   lisinopril (ZESTRIL) 20 MG tablet Take 1 tablet (20 mg total) by mouth daily. 90 tablet 3   metoprolol tartrate (LOPRESSOR) 50 MG tablet TAKE 1 TABLET(50 MG) BY MOUTH TWICE DAILY 180 tablet 1   Multiple Vitamin (MULTIVITAMIN) capsule Take 1 capsule by mouth daily.       pantoprazole (PROTONIX) 40 MG tablet Take 1 tablet (40 mg total) by mouth daily. 90 tablet 1   potassium chloride (KLOR-CON M) 10 MEQ tablet TAKE 1 TABLET(10 MEQ) BY MOUTH DAILY 30 tablet 1   rosuvastatin (CRESTOR) 40 MG tablet TAKE 1 TABLET(40 MG) BY  MOUTH EVERY EVENING 90 tablet 0   sertraline (ZOLOFT) 100 MG tablet TAKE ONE TABLET BY MOUTH ONCE A DAY 90 tablet 3   tamsulosin (FLOMAX) 0.4 MG CAPS capsule TAKE 2 CAPSULES BY MOUTH AT NIGHT 180 capsule 1   torsemide (DEMADEX) 20 MG tablet TAKE 2 TABLETS(40 MG) BY MOUTH DAILY 180 tablet 1   Vitamin D, Ergocalciferol, (DRISDOL) 1.25 MG (50000 UNIT) CAPS capsule Take one capsule po once per week 4 capsule 3   albuterol (VENTOLIN HFA) 108 (90 Base) MCG/ACT inhaler Inhale 2 puffs into the lungs every 6 (six) hours as needed for wheezing or shortness of breath. 1 each 0   enoxaparin (LOVENOX) 60 MG/0.6ML injection Lovenox 50 mg daily through 11/20/2021 and stop 14.5 mL 2   methocarbamol (ROBAXIN) 500 MG tablet Take 1 tablet (500 mg total) by mouth 2 (two) times daily as needed for muscle spasms. (Patient not taking: Reported on 03/22/2022) 60 tablet 2   nitroGLYCERIN (NITROSTAT) 0.4 MG SL tablet PLACE 1 TABLET UNDER THE TONGUE EVERY 5 MINUTES AS NEEDED FOR CHEST PAINS. (Patient taking differently: Place 0.4 mg under the tongue every 5 (five) minutes as needed for chest pain.) 25 tablet 3   polycarbophil (FIBERCON) 625 MG tablet Take 1 tablet (625 mg  total) by mouth daily. (Patient not taking: Reported on 03/22/2022) 30 tablet 0    No results found for this or any previous visit (from the past 48 hour(s)). No results found.  Review of Systems  Musculoskeletal:  Positive for neck pain.  Neurological:  Positive for weakness and numbness.   Blood pressure (!) 167/66, pulse (!) 58, temperature 98.1 F (36.7 C), temperature source Oral, resp. rate 17, height 6' (1.829 m), weight 107.5 kg, SpO2 97 %. Physical Exam HENT:     Head: Normocephalic.     Nose: Nose normal.     Mouth/Throat:     Mouth: Mucous membranes are moist.  Eyes:     Pupils: Pupils are equal, round, and reactive to light.  Cardiovascular:     Rate and Rhythm: Normal rate.  Pulmonary:     Effort: Pulmonary effort is normal.  Abdominal:     General: Abdomen is flat.  Musculoskeletal:        General: Normal range of motion.  Neurological:     Mental Status: He is alert.     Comments: Strength is 5 out of 5 deltoid, bicep, tricep, grips are weak laterally at 4-4+     Assessment/Plan 71 year old gentleman presents for posterior cervical decompressive laminectomy and fusion  Elaina Hoops, MD 04/01/2022, 7:18 AM

## 2022-04-01 NOTE — Anesthesia Procedure Notes (Signed)
Procedure Name: Intubation Date/Time: 04/01/2022 7:36 AM Performed by: Imagene Riches, CRNA Pre-anesthesia Checklist: Patient identified, Emergency Drugs available, Suction available and Patient being monitored Patient Re-evaluated:Patient Re-evaluated prior to induction Oxygen Delivery Method: Circle System Utilized Preoxygenation: Pre-oxygenation with 100% oxygen Induction Type: IV induction Ventilation: Mask ventilation without difficulty and Oral airway inserted - appropriate to patient size Laryngoscope Size: Glidescope and 4 Grade View: Grade I Tube type: Oral Tube size: 7.5 mm Number of attempts: 1 Airway Equipment and Method: Stylet and Oral airway Placement Confirmation: ETT inserted through vocal cords under direct vision, positive ETCO2 and breath sounds checked- equal and bilateral Tube secured with: Tape Dental Injury: Teeth and Oropharynx as per pre-operative assessment

## 2022-04-01 NOTE — Anesthesia Postprocedure Evaluation (Signed)
Anesthesia Post Note  Patient: [name]  Procedure(s) Performed: Post Cervical Lami/Multi level - C3-C4 - C4-C5 and fusion with lateral mass screws and posterolateral arthrodesis (Neck)     Patient location during evaluation: PACU Anesthesia Type: General Level of consciousness: awake and alert Pain management: pain level controlled Vital Signs Assessment: post-procedure vital signs reviewed and stable Respiratory status: spontaneous breathing, nonlabored ventilation, respiratory function stable and patient connected to nasal cannula oxygen Cardiovascular status: blood pressure returned to baseline and stable Postop Assessment: no apparent nausea or vomiting Anesthetic complications: no   No notable events documented.  Last Vitals:  Vitals:   04/01/22 1454 04/01/22 1610  BP: (!) 182/71 (!) 162/61  Pulse: 63 62  Resp: 16 18  Temp: 36.6 C 36.8 C  SpO2: 93% 96%    Last Pain:  Vitals:   04/01/22 1610  TempSrc: Oral  PainSc:                  Effie Berkshire

## 2022-04-01 NOTE — Op Note (Signed)
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  Lateral mass fixation utilizing the globus ellipse lateral mass screw system C3-4 C4-5  3.  Posterior lateral arthrodesis C3-4 C4-5 utilizing locally harvested autograft mixed with Vivigen  Surgeon: Dominica Severin Massey Ruhland  Assistant: Nash Shearer  Anesthesia: General  EBL: Minimal  HPI: 71 year old gentleman with progressive worsening numbness tingling weakness in his hands underwent anterior cervical discectomy and fusion decompress the spinal cord but had persistent symptoms and follow-up imaging revealed persistent stenosis posteriorly.  He still had incomplete fusion anteriorly so I recommended decompressive laminectomy and supplemental fixation with lateral mass fusion.  I extensively went over the risks and benefits of that operation with him as well as perioperative course expectations of outcome and alternatives of surgery and he understood and agreed to proceed forward.  Operative procedure: Patient was brought into the OR was [place] general anesthesia positioned prone in pins the back of his neck shaved prepped and draped in routine sterile fashion.  After infiltration of 10 cc lidocaine with epi midline incision was made and Bovie electrocautery was used to take down the subcutaneous tissue and subperiosteal dissection was carried lamina of C3-C4-C5 and the superior aspect of C6 confirmed by interoperative x-ray.  Then I remove the spinous processes of C3 and C4 and the superior aspect the spinous processes C5 started the central decompression there was marked stenosis at the 3 4 interspace as well as the 4 5 interspace and severe foraminal stenosis at the C4 and C5 nerve roots.  This was all teased off of the dura  extensive inflammatory membranes were removed and hypertrophied ligament was also removed.  This decompressed central canal and decompress the C4 and C5 foramina.  After adequate decompression achieved tension taken the lateral mass fixation utilizing inferomedial quadrant pilot holes were drilled aiming onto the superior lateral quadrant 12 mm holes were drilled 12 mm screws were placed all screws had excellent purchase rods were then placed and everything was anchored in place.  Postop fluoroscopy confirmed good position of all the implants Gelfoam was ON top of dura medium Hemovac drain was placed and the wound was closed in layers with interrupted Vicryl and a running 4 subcuticular.  Benzoin Dermabond Steri-Strips and sterile dressing was applied patient recovery in stable condition.  At the end the case all needle counts and sponge counts were correct.

## 2022-04-01 NOTE — Transfer of Care (Signed)
Immediate Anesthesia Transfer of Care Note  Patient: [name]  Procedure(s) Performed: Post Cervical Lami/Multi level - C3-C4 - C4-C5 and fusion with lateral mass screws and posterolateral arthrodesis (Neck)  Patient Location: PACU  Anesthesia Type:General  Level of Consciousness: drowsy  Airway & Oxygen Therapy: Patient Spontanous Breathing and Patient connected to face mask oxygen  Post-op Assessment: Report given to RN and Post -op Vital signs reviewed and stable  Post vital signs: Reviewed and stable  Last Vitals:  Vitals Value Taken Time  BP 163/61 04/01/22 1010  Temp 36.2 C 04/01/22 1010  Pulse 73 04/01/22 1014  Resp 21 04/01/22 1014  SpO2 100 % 04/01/22 1014  Vitals shown include unvalidated device data.  Last Pain:  Vitals:   04/01/22 1010  TempSrc:   PainSc: Asleep         Complications: No notable events documented.

## 2022-04-01 NOTE — Evaluation (Signed)
Occupational Therapy Evaluation Patient Details Name: [name] MRN: [id number] DOB: [birth year] Today's Date: 04/01/2022   History of Present Illness 71 yo M adm for scheduled ACDF.  PMH: carpal tunnel syndrome, CHF, CAD, DDD, GERD, HTN, MI, CABG x 3 (2019), hx of back surgery (2015), multiple ACDF's.  Has a cervical collar at home.   Clinical Impression   Patient admitted for the procedure above.  PTA he lives with his spouse, and is able to complete his own ADL from sit/stand level with hip kit as needed.  He generally uses 4WRW, but also has a w/c if needed.  Pain is the limiting factor, but he should progress quickly to PLOF once pain subsides.  OT will continue efforts in the acute setting, and [facility] OT is recommended until he can transition back to outpatient.        Recommendations for follow up therapy are one component of a multi-disciplinary discharge planning process, led by the attending physician.  Recommendations may be updated based on patient status, additional functional criteria and insurance authorization.   Follow Up Recommendations  Home health OT    Assistance Recommended at Discharge Intermittent Supervision/Assistance  Patient can return home with the following      Functional Status Assessment  Patient has had a recent decline in their functional status and demonstrates the ability to make significant improvements in function in a reasonable and predictable amount of time.  Equipment Recommendations  None recommended by OT    Recommendations for Other Services       Precautions / Restrictions Precautions Precautions: Cervical Precaution Booklet Issued: Yes (comment) Precaution Comments: reviewed Restrictions Weight Bearing Restrictions: No Other Position/Activity Restrictions: Drain in place to posterior incision.      Mobility Bed Mobility Overal bed mobility: Needs Assistance Bed Mobility: Sidelying to Sit   Sidelying to sit: Min assist        General bed mobility comments: elevate trunk    Transfers Overall transfer level: Needs assistance Equipment used: Rolling walker (2 wheels) Transfers: Sit to/from Stand, Bed to chair/wheelchair/BSC Sit to Stand: Min guard     Step pivot transfers: Min guard            Balance Overall balance assessment: Needs assistance Sitting-balance support: Feet supported Sitting balance-Leahy Scale: Good     Standing balance support: Reliant on assistive device for balance Standing balance-Leahy Scale: Fair                             ADL either performed or assessed with clinical judgement   ADL       Grooming: Wash/dry hands;Wash/dry face;Set up;Sitting           Upper Body Dressing : Minimal assistance;Sitting   Lower Body Dressing: Moderate assistance;Sit to/from stand   Toilet Transfer: Nature conservation officer;Ambulation;Rolling walker (2 wheels)                   Vision Baseline Vision/History: 1 Wears glasses Patient Visual Report: Blurring of vision       Perception Perception Perception: Not tested   Praxis Praxis Praxis: Not tested    Pertinent Vitals/Pain Pain Assessment Pain Assessment: Faces Faces Pain Scale: Hurts even more Pain Location: neck and upper traps Pain Descriptors / Indicators: Grimacing, Guarding, Sharp, Radiating Pain Intervention(s): Monitored during session, Patient requesting pain meds-RN notified     Hand Dominance Right (uses L hand more as it is stronger)  Extremity/Trunk Assessment Upper Extremity Assessment Upper Extremity Assessment: Generalized weakness   Lower Extremity Assessment Lower Extremity Assessment: Defer to PT evaluation   Cervical / Trunk Assessment Cervical / Trunk Assessment: Neck Surgery;Kyphotic   Communication Communication Communication: No difficulties   Cognition Arousal/Alertness: Awake/alert Behavior During Therapy: WFL for tasks assessed/performed Overall Cognitive  Status: Within Functional Limits for tasks assessed                                       General Comments   VSS on RA    Exercises     Shoulder Instructions      Home Living Family/patient expects to be discharged to:: Private residence Living Arrangements: Spouse/significant other Available Help at Discharge: Family;Available 24 hours/day Type of Home: House Home Access: Ramped entrance     Home Layout: One level     Bathroom Shower/Tub: Occupational psychologist: Handicapped height Bathroom Accessibility: Yes How Accessible: Accessible via walker Home Equipment: [place] (2 wheels);Rollator (4 wheels);BSC/3in1;Adaptive equipment;Hand held shower head;Grab bars - tub/shower;Shower seat;Wheelchair - Diplomatic Services operational officer Equipment: Reacher;Sock aid;Long-handled shoe horn;Long-handled sponge;Feeding equipment Additional Comments: bed elevates head      Prior Functioning/Environment Prior Level of Function : Needs assist             Mobility Comments: Generally uses 4WRW for mobility, but also will use w/c and push it with his legs backwards.  Spouse drives. ADLs Comments: Mod I with ADL with hip kit from sit/stand level.  Manages his own meds, participates with light iADL        OT Problem List: Decreased strength;Impaired balance (sitting and/or standing);Pain      OT Treatment/Interventions: Self-care/ADL training;Therapeutic activities;Patient/family education;Balance training    OT Goals(Current goals can be found in the care plan section) Acute Rehab OT Goals Patient Stated Goal: Return home with [facility] OT Goal Formulation: With patient Time For Goal Achievement: 04/15/22 Potential to Achieve Goals: Good  OT Frequency: Min 2X/week    Co-evaluation              AM-PAC OT "6 Clicks" Daily Activity     Outcome Measure Help from another person eating meals?: None Help from another person taking care of personal grooming?:  None Help from another person toileting, which includes using toliet, bedpan, or urinal?: A Little Help from another person bathing (including washing, rinsing, drying)?: A Little Help from another person to put on and taking off regular upper body clothing?: A Little Help from another person to put on and taking off regular lower body clothing?: A Little 6 Click Score: 20   End of Session Equipment Utilized During Treatment: Rolling walker (2 wheels) Nurse Communication: Mobility status  Activity Tolerance: Patient tolerated treatment well Patient left: in chair;with call bell/phone within reach;with nursing/sitter in room  OT Visit Diagnosis: Muscle weakness (generalized) (M62.81);Pain                Time: 8119-1478 OT Time Calculation (min): 21 min Charges:  OT General Charges $OT Visit: 1 Visit OT Evaluation $OT Eval Moderate Complexity: 1 Mod  04/01/2022  RP, OTR/L  Acute Rehabilitation Services  Office:  [phone]   Metta Clines 04/01/2022, 3:27 PM

## 2022-04-02 NOTE — Progress Notes (Signed)
Occupational Therapy Treatment Patient Details Name: [name] MRN: [id number] DOB: 17-Mar-1951 Today's Date: 04/02/2022   History of present illness 71 yo M adm for scheduled ACDF 04/01/2022. Pt underwent C3-5 ACDF on 6/2. PMH: carpal tunnel syndrome, CHF, CAD, DDD, GERD, HTN, MI, CABG x 3 (2019), hx of back surgery (2015), multiple ACDF's.   OT comments  Pt. Seen for skilled OT session.  Focus on short distance mobility in preparation for making it to/from b.room for toileting tasks.  Pt. Also able to demonstrate safe bed mobility into bed hob minimally elevated to simulate home environment.  Wife present and active in pt.s care. Both report having necessary dme at home.  Agree with current d/c recommendations.     Recommendations for follow up therapy are one component of a multi-disciplinary discharge planning process, led by the attending physician.  Recommendations may be updated based on patient status, additional functional criteria and insurance authorization.    Follow Up Recommendations  Home health OT    Assistance Recommended at Discharge Intermittent Supervision/Assistance  Patient can return home with the following      Equipment Recommendations  None recommended by OT    Recommendations for Other Services      Precautions / Restrictions Precautions Precautions: Cervical Precaution Booklet Issued: Yes (comment) Precaution Comments: reviewed Restrictions Weight Bearing Restrictions: No Other Position/Activity Restrictions: Drain in place to posterior incision.       Mobility Bed Mobility Overal bed mobility: Needs Assistance Bed Mobility: Sit to Supine, Sit to Sidelying, Rolling Rolling: Supervision Sidelying to sit: Min guard   Sit to supine: Supervision   General bed mobility comments: pt. able to sit on R side of bed and turn body into bed managing b les without assistance and lay back with hob elevated and pillow support    Transfers Overall transfer  level: Needs assistance Equipment used: Rolling walker (2 wheels) Transfers: Sit to/from Stand, Bed to chair/wheelchair/BSC Sit to Stand: Min guard     Step pivot transfers: Min guard           Balance                                           ADL either performed or assessed with clinical judgement   ADL Overall ADL's : Needs assistance/impaired                         Toilet Transfer: Min guard;Ambulation;Rolling walker (2 wheels) Toilet Transfer Details (indicate cue type and reason): observed with in room ambualtion from recliner around to other side of bed         Functional mobility during ADLs: Min guard;Rolling walker (2 wheels) General ADL Comments: moving well, focused on short distance amb.simulating b.room distance and bed mobility in prep for home    Extremity/Trunk Assessment Upper Extremity Assessment Upper Extremity Assessment: Generalized weakness   Lower Extremity Assessment Lower Extremity Assessment: Generalized weakness   Cervical / Trunk Assessment Cervical / Trunk Assessment: Neck Surgery    Vision       Perception     Praxis      Cognition Arousal/Alertness: Awake/alert Behavior During Therapy: WFL for tasks assessed/performed Overall Cognitive Status: Within Functional Limits for tasks assessed  Exercises      Shoulder Instructions       General Comments VSS on RA    Pertinent Vitals/ Pain       Pain Assessment Pain Assessment: 0-10 Pain Score: 6  Pain Location: neck Pain Descriptors / Indicators: Aching Pain Intervention(s): Limited activity within patient's tolerance, Monitored during session, Repositioned, Premedicated before session  [place] expects to be discharged to:: Private residence Living Arrangements: Spouse/significant other Available Help at Discharge: Family;Available 24 hours/day Type of Home:  House Home Access: Ramped entrance     Home Layout: One level     Bathroom Shower/Tub: Occupational psychologist: Handicapped height Bathroom Accessibility: Yes   Home Equipment: Conservation officer, nature (2 wheels);Rollator (4 wheels);BSC/3in1;Adaptive equipment;Hand held shower head;Grab bars - tub/shower;Shower seat;Wheelchair - Diplomatic Services operational officer Equipment: Reacher;Sock aid;Long-handled shoe horn;Long-handled sponge;Feeding equipment Additional Comments: bed elevates head      Prior Functioning/Environment              Frequency  Min 2X/week        Progress Toward Goals  OT Goals(current goals can now be found in the care plan section)  Progress towards OT goals: Progressing toward goals  ADL Goals Pt Will Perform Grooming: with modified independence;standing Pt Will Perform Upper Body Dressing: with set-up;sitting Pt Will Perform Lower Body Dressing: with supervision;with adaptive equipment;sit to/from stand Pt Will Transfer to Toilet: with modified independence;ambulating;regular height toilet  Plan Discharge plan remains appropriate    Co-evaluation                 AM-PAC OT "6 Clicks" Daily Activity     Outcome Measure   Help from another person eating meals?: None Help from another person taking care of personal grooming?: None Help from another person toileting, which includes using toliet, bedpan, or urinal?: A Little Help from another person bathing (including washing, rinsing, drying)?: A Little Help from another person to put on and taking off regular upper body clothing?: A Little Help from another person to put on and taking off regular lower body clothing?: A Little 6 Click Score: 20    End of Session Equipment Utilized During Treatment: Rolling walker (2 wheels)  OT Visit Diagnosis: Muscle weakness (generalized) (M62.81);Pain   Activity Tolerance Patient tolerated treatment well   Patient Left in bed;with call bell/phone within  reach;with family/visitor present   Nurse Communication Other (comment) (reviewed with rn purewik in place but pt. would benefit from amb. to/from b.room in prep for home)        Time: 2094-7096 OT Time Calculation (min): 24 min  Charges: OT General Charges $OT Visit: 1 Visit OT Treatments $Self Care/Home Management : 23-37 mins  Sonia Baller, COTA/L Acute Rehabilitation [phone]   Tanya Nones 04/02/2022, 12:06 PM

## 2022-04-02 NOTE — Progress Notes (Signed)
Subjective: Patient reports  patient doing well improved numbness in his hands improved dexterity  Objective: Vital signs in last 24 hours: Temp:  [97.2 F (36.2 C)-98.6 F (37 C)] 98.5 F (36.9 C) (06/03 0747) Pulse Rate:  [61-69] 61 (06/03 0747) Resp:  [13-19] 19 (06/03 0310) BP: (156-189)/(61-96) 164/71 (06/03 0310) SpO2:  [93 %-100 %] 97 % (06/03 0747)  Intake/Output from previous day: 06/02 0701 - 06/03 0700 In: 3713 [P.O.:600; I.V.:1003; IV AVWPVXYIA:1655] Out: 3748 [Urine:2000; Drains:275; Blood:150] Intake/Output this shift: No intake/output data recorded.  Strength 5 out of 5 incision clean dry and intact  Lab Results: No results for input(s): WBC, HGB, HCT, PLT in the last 72 hours. BMET No results for input(s): NA, K, CL, CO2, GLUCOSE, BUN, CREATININE, CALCIUM in the last 72 hours.  Studies/Results: DG Cervical Spine 1 View  Result Date: 04/01/2022 CLINICAL DATA:  Surgical fusion of cervical spine. EXAM: DG CERVICAL SPINE - 1 VIEW; DG C-ARM 1-60 MIN-NO REPORT Radiation exposure index: 2.77 mGy. COMPARISON:  July 22, 2021. FINDINGS: Three intraoperative fluoroscopic images were obtained of the cervical spine. These were obtained during surgical posterior fusion of C3-4 and C4-5 with bilateral intrapedicular screw placement. IMPRESSION: Fluoroscopic guidance provided during surgical posterior fusion of C3-4 and C4-5. Electronically Signed   By: Marijo Conception M.D.   On: 04/01/2022 09:54   DG C-Arm 1-60 Min-No Report  Result Date: 04/01/2022 CLINICAL DATA:  Surgical fusion of cervical spine. EXAM: DG CERVICAL SPINE - 1 VIEW; DG C-ARM 1-60 MIN-NO REPORT Radiation exposure index: 2.77 mGy. COMPARISON:  July 22, 2021. FINDINGS: Three intraoperative fluoroscopic images were obtained of the cervical spine. These were obtained during surgical posterior fusion of C3-4 and C4-5 with bilateral intrapedicular screw placement. IMPRESSION: Fluoroscopic guidance provided during  surgical posterior fusion of C3-4 and C4-5. Electronically Signed   By: Marijo Conception M.D.   On: 04/01/2022 09:54    Assessment/Plan: 71 year old postop day 1 posterior cervical decompressive laminectomy and fusion doing fairly well appropriate soreness in the back of his neck.  Continue drain work with physical occupational therapy probable discharge tomorrow  LOS: 1 day     Elaina Hoops 04/02/2022, 8:23 AM

## 2022-04-02 NOTE — Evaluation (Signed)
Physical Therapy Evaluation Patient Details Name: [name] MRN: [id number] DOB: 04-22-1951 Today's Date: 04/02/2022  History of Present Illness  71 yo M adm for scheduled ACDF 04/01/2022. Pt underwent C3-5 ACDF on 6/2. PMH: carpal tunnel syndrome, CHF, CAD, DDD, GERD, HTN, MI, CABG x 3 (2019), hx of back surgery (2015), multiple ACDF's.  Clinical Impression  Pt presents to PT with deficits in activity tolerance, strength, power, endurance, balance. Per pt reports his gait quality is not that far from baseline, with pain limiting his activity tolerance currently. Pt is able to ambulate for household distances with walker. PT encourages frequent mobility and provides reinforcement of neck precautions. Pt will benefit from continued acute therapies during this admission in an effort to improve ambulation tolerance and to reduce falls risk.       Recommendations for follow up therapy are one component of a multi-disciplinary discharge planning process, led by the attending physician.  Recommendations may be updated based on patient status, additional functional criteria and insurance authorization.  Follow Up Recommendations Outpatient PT    Assistance Recommended at Discharge PRN  Patient can return home with the following  A little help with walking and/or transfers;A little help with bathing/dressing/bathroom;Assist for transportation;Help with stairs or ramp for entrance;Assistance with cooking/housework    Equipment Recommendations None recommended by PT  Recommendations for Other Services       Functional Status Assessment Patient has had a recent decline in their functional status and demonstrates the ability to make significant improvements in function in a reasonable and predictable amount of time.     Precautions / Restrictions Precautions Precautions: Cervical Precaution Booklet Issued: Yes (comment) Precaution Comments: reviewed, pt with no recall Restrictions Weight Bearing  Restrictions: No      Mobility  Bed Mobility Overal bed mobility: Needs Assistance Bed Mobility: Rolling, Sidelying to Sit Rolling: Supervision Sidelying to sit: Min assist            Transfers Overall transfer level: Needs assistance Equipment used: Rollator (4 wheels) Transfers: Sit to/from Stand Sit to Stand: Min guard, From elevated surface                Ambulation/Gait Ambulation/Gait assistance: Supervision Gait Distance (Feet): 250 Feet Assistive device: Rollator (4 wheels) Gait Pattern/deviations: Step-through pattern, Trunk flexed Gait velocity: reduced Gait velocity interpretation: 1.31 - 2.62 ft/sec, indicative of limited community ambulator   General Gait Details: pt ambulates with reduced gait speed initially, increasing with further ambulation. Increased trunk flexion  Stairs            Wheelchair Mobility    Modified Rankin (Stroke Patients Only)       Balance Overall balance assessment: Needs assistance Sitting-balance support: No upper extremity supported, Feet supported Sitting balance-Leahy Scale: Good     Standing balance support: Bilateral upper extremity supported, Reliant on assistive device for balance Standing balance-Leahy Scale: Poor                               Pertinent Vitals/Pain Pain Assessment Pain Assessment: 0-10 Pain Score: 6  Pain Location: neck Pain Descriptors / Indicators: Aching Pain Intervention(s): Monitored during session    Home Living Family/patient expects to be discharged to:: Private residence Living Arrangements: Spouse/significant other Available Help at Discharge: Family;Available 24 hours/day Type of Home: House Home Access: Ramped entrance       Home Layout: One level Home Equipment: Conservation officer, nature (2 wheels);Rollator (4 wheels);BSC/3in1;Adaptive  equipment;Hand held shower head;Grab bars - tub/shower;Shower seat;Wheelchair - manual Additional Comments: bed elevates  head    Prior Function Prior Level of Function : Needs assist             Mobility Comments: Generally uses 4WRW for mobility, but also will use w/c and push it with his legs backwards.  Spouse drives. ADLs Comments: Mod I with ADL with hip kit from sit/stand level.  Manages his own meds, participates with light iADL     Hand Dominance   Dominant Hand: Right    Extremity/Trunk Assessment   Upper Extremity Assessment Upper Extremity Assessment: Generalized weakness    Lower Extremity Assessment Lower Extremity Assessment: Generalized weakness    Cervical / Trunk Assessment Cervical / Trunk Assessment: Neck Surgery  Communication   Communication: No difficulties  Cognition Arousal/Alertness: Awake/alert Behavior During Therapy: WFL for tasks assessed/performed Overall Cognitive Status: Within Functional Limits for tasks assessed                                          General Comments General comments (skin integrity, edema, etc.): VSS on RA    Exercises     Assessment/Plan    PT Assessment Patient needs continued PT services  PT Problem List Decreased strength;Decreased activity tolerance;Decreased mobility;Decreased balance;Decreased knowledge of precautions;Pain       PT Treatment Interventions DME instruction;Gait training;Functional mobility training;Therapeutic activities;Therapeutic exercise;Balance training;Neuromuscular re-education;Patient/family education;Wheelchair mobility training    PT Goals (Current goals can be found in the Care Plan section)  Acute Rehab PT Goals Patient Stated Goal: to walk for limited community distances PT Goal Formulation: With patient Time For Goal Achievement: 04/16/22 Potential to Achieve Goals: Fair    Frequency Min 5X/week     Co-evaluation               AM-PAC PT "6 Clicks" Mobility  Outcome Measure Help needed turning from your back to your side while in a flat bed without using  bedrails?: A Little Help needed moving from lying on your back to sitting on the side of a flat bed without using bedrails?: A Little Help needed moving to and from a bed to a chair (including a wheelchair)?: A Little Help needed standing up from a chair using your arms (e.g., wheelchair or bedside chair)?: A Little Help needed to walk in hospital room?: A Little Help needed climbing 3-5 steps with a railing? : Total 6 Click Score: 16    End of Session   Activity Tolerance: Patient tolerated treatment well Patient left: in chair;with call bell/phone within reach;with chair alarm set Nurse Communication: Mobility status PT Visit Diagnosis: Other abnormalities of gait and mobility (R26.89);Muscle weakness (generalized) (M62.81)    Time: 5830-9407 PT Time Calculation (min) (ACUTE ONLY): 27 min   Charges:   PT Evaluation $PT Eval Low Complexity: [place], PT, DPT Acute Rehabilitation Office [phone]   Zenaida Niece 04/02/2022, 9:43 AM

## 2022-04-03 MED ORDER — CYCLOBENZAPRINE HCL 10 MG PO TABS: 10.0000 mg | ORAL_TABLET | Freq: Three times a day (TID) | ORAL | 0 refills | Status: DC | PRN

## 2022-04-03 MED ORDER — OXYCODONE-ACETAMINOPHEN 7.5-325 MG PO TABS: 1.0000 | ORAL_TABLET | ORAL | 0 refills | Status: DC | PRN

## 2022-04-03 NOTE — Discharge Summary (Signed)
Physician Discharge Summary  Patient ID: [name] MRN: [id number] DOB/AGE: [birth year] 71 y.o. Estimated body mass index is 32.14 kg/m as calculated from the following:   Height as of this encounter: 6' (1.829 m).   Weight as of this encounter: 107.5 kg.   Admit date: 04/01/2022 Discharge date: 04/03/2022  Admission Diagnoses: Cervical spondylitic myelopathy  Discharge Diagnoses: Same Principal Problem:   Myelopathy concurrent with and due to spinal stenosis of cervical region [facility])   Discharged Condition: good  Hospital Course: Patient was admitted underwent decompressive cervical laminectomy and posterior cervical fusion.  Patient with condition neck pain but doing much better neurologic improved increased hand mobility and sensation.  Drain output minimal so drain was discontinued and patient stable for discharge home with scheduled follow-up in 1 to 2 weeks.  Consults: Significant Diagnostic Studies: Treatments: Decompressive cervical laminectomy and fusion Discharge Exam: Blood pressure (!) 170/67, pulse 67, temperature 98 F (36.7 C), temperature source Oral, resp. rate 19, height 6' (1.829 m), weight 107.5 kg, SpO2 97 %. Strength 5 out of 5 and clean dry and intact  Disposition: Home   Allergies as of 04/03/2022       Reactions   Penicillins Other (See Comments)   SYNCOPAL EPISODE Has patient had a PCN reaction causing immediate rash, facial/tongue/throat swelling, SOB or LIGHTHEADEDNESS with HYPOTENSION [SYNCOPE] #  #  #  YES  #  #  #  Has patient had a PCN reaction causing severe rash involving mucus membranes or skin necrosis:No Has patient had a PCN reaction that required hospitalization:No Has patient had a PCN reaction occurring within the last 10 years:Yes   Sulfa Antibiotics Swelling   SWELLING REACTION UNSPECIFIED > PER PREVIOUS PMH   Zoloft [sertraline Hcl] Other (See Comments)   Confusion Patient is taking Down        Medication List     TAKE  these medications    acetaminophen 325 MG tablet Commonly known as: TYLENOL Take 2 tablets (650 mg total) by mouth every 6 (six) hours as needed for mild pain (or Fever >/= 101). What changed: how much to take   albuterol 108 (90 Base) MCG/ACT inhaler Commonly known as: VENTOLIN HFA Inhale 2 puffs into the lungs every 6 (six) hours as needed for wheezing or shortness of breath.   alendronate 70 MG tablet Commonly known as: FOSAMAX Take 1 tablet (70 mg total) by mouth every 7 (seven) days. Take with a full glass of water on an empty stomach.   baclofen 10 MG tablet Commonly known as: LIORESAL Take 0.5 tablets (5 mg total) by mouth 3 (three) times daily. X 1 weeks- then can increase to 10 mg 3x/day- for spasticity What changed: how much to take   cholecalciferol 25 MCG (1000 UNIT) tablet Commonly known as: VITAMIN D3 Take 1,000 Units by mouth daily.   cyclobenzaprine 10 MG tablet Commonly known as: FLEXERIL Take 1 tablet (10 mg total) by mouth 3 (three) times daily as needed for muscle spasms.   enoxaparin 60 MG/0.6ML injection Commonly known as: LOVENOX Lovenox 50 mg daily through 11/20/2021 and stop   ezetimibe 10 MG tablet Commonly known as: Zetia Take 1 tablet (10 mg total) by mouth daily.   gabapentin 600 MG tablet Commonly known as: Neurontin Take 1 tablet (600 mg total) by mouth 3 (three) times daily.   levothyroxine 150 MCG tablet Commonly known as: SYNTHROID TAKE 1 TABLET BY MOUTH EVERY DAY   lisinopril 20 MG tablet Commonly known as:  ZESTRIL Take 1 tablet (20 mg total) by mouth daily.   methocarbamol 500 MG tablet Commonly known as: ROBAXIN Take 1 tablet (500 mg total) by mouth 2 (two) times daily as needed for muscle spasms.   metoprolol tartrate 50 MG tablet Commonly known as: LOPRESSOR TAKE 1 TABLET(50 MG) BY MOUTH TWICE DAILY   multivitamin capsule Take 1 capsule by mouth daily.   nitroGLYCERIN 0.4 MG SL tablet Commonly known as: NITROSTAT PLACE  1 TABLET UNDER THE TONGUE EVERY 5 MINUTES AS NEEDED FOR CHEST PAINS. What changed: See the new instructions.   oxyCODONE-acetaminophen 7.5-325 MG tablet Commonly known as: Percocet Take 1 tablet by mouth every 4 (four) hours as needed for severe pain.   pantoprazole 40 MG tablet Commonly known as: PROTONIX Take 1 tablet (40 mg total) by mouth daily.   polycarbophil 625 MG tablet Commonly known as: FIBERCON Take 1 tablet (625 mg total) by mouth daily.   potassium chloride 10 MEQ tablet Commonly known as: KLOR-CON M TAKE 1 TABLET(10 MEQ) BY MOUTH DAILY   rosuvastatin 40 MG tablet Commonly known as: CRESTOR TAKE 1 TABLET(40 MG) BY MOUTH EVERY EVENING   sertraline 100 MG tablet Commonly known as: Zoloft TAKE ONE TABLET BY MOUTH ONCE A DAY   tamsulosin 0.4 MG Caps capsule Commonly known as: FLOMAX TAKE 2 CAPSULES BY MOUTH AT NIGHT   torsemide 20 MG tablet Commonly known as: DEMADEX TAKE 2 TABLETS(40 MG) BY MOUTH DAILY   Vitamin D (Ergocalciferol) 1.25 MG (50000 UNIT) Caps capsule Commonly known as: DRISDOL Take one capsule po once per week         Signed: Elaina Hoops 04/03/2022, 8:47 AM

## 2022-04-03 NOTE — Progress Notes (Signed)
Physical Therapy Treatment Patient Details Name: [name] MRN: [id number] DOB: 1951-10-25 Today's Date: 04/03/2022   History of Present Illness 71 yo M adm for scheduled ACDF 04/01/2022. Pt underwent C3-5 ACDF on 6/2. PMH: carpal tunnel syndrome, CHF, CAD, DDD, GERD, HTN, MI, CABG x 3 (2019), hx of back surgery (2015), multiple ACDF's.    PT Comments    Pt tolerates treatment well although with increased fatigue and balance/gait deviations this session. Pt with increased trunk flexion and impaired control of gait speed, increasing his risk for falls. PT suggests use of 2 wheeled walker to reduce trunk flexion and improve control. PT continues to recommend outpatient PT.  Recommendations for follow up therapy are one component of a multi-disciplinary discharge planning process, led by the attending physician.  Recommendations may be updated based on patient status, additional functional criteria and insurance authorization.  Follow Up Recommendations  Outpatient PT     Assistance Recommended at Discharge PRN  Patient can return home with the following A little help with walking and/or transfers;A little help with bathing/dressing/bathroom;Assist for transportation;Help with stairs or ramp for entrance;Assistance with cooking/housework   Equipment Recommendations  None recommended by PT    Recommendations for Other Services       Precautions / Restrictions Precautions Precautions: Cervical Precaution Booklet Issued: Yes (comment) Precaution Comments: reviewed Restrictions Weight Bearing Restrictions: No     Mobility  Bed Mobility                    Transfers Overall transfer level: Needs assistance Equipment used: Rollator (4 wheels) Transfers: Sit to/from Stand Sit to Stand: Min guard                Ambulation/Gait Ambulation/Gait assistance: Min guard Gait Distance (Feet): 250 Feet Assistive device: Rollator (4 wheels) Gait Pattern/deviations:  Step-through pattern, Trunk flexed Gait velocity: reduced Gait velocity interpretation: 1.31 - 2.62 ft/sec, indicative of limited community ambulator   General Gait Details: pt with increased trunk flexion, often gaining anterior momentum requiring PT verbal cues to slow gait speed. Pt able to improve posture with verbal cues however quickly returns to flexed posture and is unable to maintain. Pt requiring multiple standing rest breaks near completion of walk due to LE fatigue   Stairs             Wheelchair Mobility    Modified Rankin (Stroke Patients Only)       Balance Overall balance assessment: Needs assistance Sitting-balance support: No upper extremity supported, Feet supported Sitting balance-Leahy Scale: Good     Standing balance support: Bilateral upper extremity supported Standing balance-Leahy Scale: Poor                              Cognition Arousal/Alertness: Awake/alert Behavior During Therapy: WFL for tasks assessed/performed Overall Cognitive Status: Within Functional Limits for tasks assessed                                          Exercises      General Comments General comments (skin integrity, edema, etc.): VSS on RA      Pertinent Vitals/Pain Pain Assessment Pain Assessment: 0-10 Pain Score: 6  Pain Location: neck Pain Descriptors / Indicators: Sore Pain Intervention(s): Premedicated before session    Home Living  Prior Function            PT Goals (current goals can now be found in the care plan section) Acute Rehab PT Goals Patient Stated Goal: to walk for limited community distances Progress towards PT goals: Not progressing toward goals - comment (mild regression)    Frequency    Min 5X/week      PT Plan Current plan remains appropriate    Co-evaluation              AM-PAC PT "6 Clicks" Mobility   Outcome Measure  Help needed turning from  your back to your side while in a flat bed without using bedrails?: A Little Help needed moving from lying on your back to sitting on the side of a flat bed without using bedrails?: A Little Help needed moving to and from a bed to a chair (including a wheelchair)?: A Little Help needed standing up from a chair using your arms (e.g., wheelchair or bedside chair)?: A Little Help needed to walk in hospital room?: A Little Help needed climbing 3-5 steps with a railing? : Total 6 Click Score: 16    End of Session   Activity Tolerance: Patient tolerated treatment well Patient left: in chair;with call bell/phone within reach;with family/visitor present Nurse Communication: Mobility status PT Visit Diagnosis: Other abnormalities of gait and mobility (R26.89);Muscle weakness (generalized) (M62.81)     Time: 2060-1561 PT Time Calculation (min) (ACUTE ONLY): 13 min  Charges:  $Gait Training: 8-22 mins                     Zenaida Niece, PT, DPT Acute Rehabilitation Office [facility] 04/03/2022, 10:10 AM

## 2022-04-04 ENCOUNTER — Encounter (HOSPITAL_COMMUNITY): Payer: Self-pay | Admitting: Neurosurgery

## 2022-04-04 MED FILL — Thrombin For Soln 5000 Unit: CUTANEOUS | Qty: 5000 | Status: AC

## 2022-05-05 ENCOUNTER — Other Ambulatory Visit: Payer: Self-pay | Admitting: Family Medicine

## 2022-05-06 ENCOUNTER — Ambulatory Visit (INDEPENDENT_AMBULATORY_CARE_PROVIDER_SITE_OTHER): Payer: Medicare Other | Admitting: Family Medicine

## 2022-05-06 ENCOUNTER — Encounter: Payer: Self-pay | Admitting: Family Medicine

## 2022-05-06 VITALS — BP 114/60 | Wt 247.4 lb

## 2022-05-06 DIAGNOSIS — G959 Disease of spinal cord, unspecified: Secondary | ICD-10-CM

## 2022-05-06 DIAGNOSIS — E7849 Other hyperlipidemia: Secondary | ICD-10-CM

## 2022-05-06 DIAGNOSIS — I10 Essential (primary) hypertension: Secondary | ICD-10-CM | POA: Diagnosis not present

## 2022-05-06 DIAGNOSIS — Z125 Encounter for screening for malignant neoplasm of prostate: Secondary | ICD-10-CM

## 2022-05-06 DIAGNOSIS — D696 Thrombocytopenia, unspecified: Secondary | ICD-10-CM

## 2022-05-06 DIAGNOSIS — M8580 Other specified disorders of bone density and structure, unspecified site: Secondary | ICD-10-CM | POA: Diagnosis not present

## 2022-05-06 DIAGNOSIS — R252 Cramp and spasm: Secondary | ICD-10-CM | POA: Diagnosis not present

## 2022-05-06 DIAGNOSIS — N319 Neuromuscular dysfunction of bladder, unspecified: Secondary | ICD-10-CM

## 2022-05-06 MED ORDER — ROSUVASTATIN CALCIUM 40 MG PO TABS: ORAL_TABLET | ORAL | 1 refills | Status: DC

## 2022-05-06 MED ORDER — GABAPENTIN 600 MG PO TABS: 600.0000 mg | ORAL_TABLET | Freq: Three times a day (TID) | ORAL | 1 refills | Status: DC

## 2022-05-06 MED ORDER — BACLOFEN 10 MG PO TABS: 10.0000 mg | ORAL_TABLET | Freq: Three times a day (TID) | ORAL | 5 refills | Status: DC

## 2022-05-06 MED ORDER — ALENDRONATE SODIUM 70 MG PO TABS: 70.0000 mg | ORAL_TABLET | ORAL | 11 refills | Status: DC

## 2022-05-06 MED ORDER — EZETIMIBE 10 MG PO TABS: 10.0000 mg | ORAL_TABLET | Freq: Every day | ORAL | 2 refills | Status: DC

## 2022-05-06 NOTE — Patient Instructions (Signed)

## 2022-05-06 NOTE — Progress Notes (Signed)
   Subjective:    Patient ID: [name], male    DOB: 01/06/1951, 71 y.o.   MRN: [id number]  HPI Pt arrives with wife for follow up on HTN. Pt states blood pressures are up and down.   Pt has been having double vision.   Pt believes it may be due to recent surgery on 04/01/22.   Pt has questions about some meds. Pt was going to a spinal doctor but states it is hard for them to get over there.  Essential hypertension - Plan: PSA, Basic metabolic panel, TSH, Lipid panel, Hepatic function panel, CBC with Differential  Osteopenia, unspecified location - Plan: alendronate (FOSAMAX) 70 MG tablet, PSA, Basic metabolic panel, TSH, Lipid panel, Hepatic function panel, CBC with Differential  Myelopathy (HCC) - Plan: PSA, Basic metabolic panel, TSH, Lipid panel, Hepatic function panel, CBC with Differential  Other hyperlipidemia - Plan: PSA, Basic metabolic panel, TSH, Lipid panel, Hepatic function panel, CBC with Differential  Neurogenic bladder - Plan: PSA, Basic metabolic panel, TSH, Lipid panel, Hepatic function panel, CBC with Differential  Spasticity - Plan: PSA, Basic metabolic panel, TSH, Lipid panel, Hepatic function panel, CBC with Differential   Review of Systems     Objective:   Physical Exam Lungs are clear hearts regular weakness in the legs are noted some edema bilateral but no tenderness pulse rate is controlled  Has pulses in both the normal feet     Assessment & Plan:   1. Osteopenia, unspecified location Continue Fosamax.  Continue healthy diet - alendronate (FOSAMAX) 70 MG tablet; Take 1 tablet (70 mg total) by mouth every 7 (seven) days. Take with a full glass of water on an empty stomach.  Dispense: 4 tablet; Refill: 11 - PSA - Basic metabolic panel - TSH - Lipid panel - Hepatic function panel - CBC with Differential  2. Essential hypertension Continue medication.  Blood pressure overall good if blood pressure starts going too low let us know or significant  dizziness - PSA - Basic metabolic panel - TSH - Lipid panel - Hepatic function panel - CBC with Differential  3. Myelopathy [facility]) Surgery is helped but he still has weakness in the legs and requires use of walker on a regular basis - PSA - Basic metabolic panel - TSH - Lipid panel - Hepatic function panel - CBC with Differential  4. Other hyperlipidemia Continue medication watch diet closely - PSA - Basic metabolic panel - TSH - Lipid panel - Hepatic function panel - CBC with Differential  5. Neurogenic bladder Not much 1 can do about this hopefully over time it gets better - PSA - Basic metabolic panel - TSH - Lipid panel - Hepatic function panel - CBC with Differential  6. Spasticity I did renew his baclofen and gabapentin.Marland Kitchen  He was seeing a physical medicine specialist but he is having severe difficulty getting around and would like to consolidate his care we are happy to help him out in this regards but at the same time if his condition worsens we will reinvolve the specialist  Patient was cautioned that muscle relaxers can cause drowsiness if he is noted noticing drowsiness he needs to reduce the dose and minimize use of pain medicine with this combination - PSA - Basic metabolic panel - TSH - Lipid panel - Hepatic function panel - CBC with Differential Follow-up within 3 to 4 months

## 2022-05-12 ENCOUNTER — Encounter: Payer: Self-pay | Admitting: Physical Medicine and Rehabilitation

## 2022-05-13 ENCOUNTER — Other Ambulatory Visit: Payer: Self-pay | Admitting: Family Medicine

## 2022-05-13 DIAGNOSIS — M8580 Other specified disorders of bone density and structure, unspecified site: Secondary | ICD-10-CM

## 2022-05-19 DIAGNOSIS — M5412 Radiculopathy, cervical region: Secondary | ICD-10-CM | POA: Diagnosis not present

## 2022-06-02 ENCOUNTER — Other Ambulatory Visit: Payer: Self-pay | Admitting: Physical Medicine and Rehabilitation

## 2022-06-02 DIAGNOSIS — R252 Cramp and spasm: Secondary | ICD-10-CM

## 2022-06-07 ENCOUNTER — Encounter: Payer: Self-pay | Admitting: Family Medicine

## 2022-06-07 DIAGNOSIS — E039 Hypothyroidism, unspecified: Secondary | ICD-10-CM

## 2022-06-07 DIAGNOSIS — E559 Vitamin D deficiency, unspecified: Secondary | ICD-10-CM

## 2022-06-07 DIAGNOSIS — E7849 Other hyperlipidemia: Secondary | ICD-10-CM

## 2022-06-07 DIAGNOSIS — I10 Essential (primary) hypertension: Secondary | ICD-10-CM

## 2022-06-07 DIAGNOSIS — Z125 Encounter for screening for malignant neoplasm of prostate: Secondary | ICD-10-CM

## 2022-06-07 DIAGNOSIS — Z79899 Other long term (current) drug therapy: Secondary | ICD-10-CM

## 2022-06-08 ENCOUNTER — Other Ambulatory Visit: Payer: Self-pay | Admitting: Family Medicine

## 2022-06-08 DIAGNOSIS — R252 Cramp and spasm: Secondary | ICD-10-CM

## 2022-06-08 MED ORDER — GABAPENTIN 600 MG PO TABS: 600.0000 mg | ORAL_TABLET | Freq: Three times a day (TID) | ORAL | 1 refills | Status: DC

## 2022-06-08 MED ORDER — BACLOFEN 10 MG PO TABS: 10.0000 mg | ORAL_TABLET | Freq: Three times a day (TID) | ORAL | 5 refills | Status: DC

## 2022-06-16 ENCOUNTER — Encounter (HOSPITAL_COMMUNITY): Payer: Self-pay | Admitting: Physical Therapy

## 2022-06-16 ENCOUNTER — Ambulatory Visit (HOSPITAL_COMMUNITY): Payer: Medicare Other | Attending: Student | Admitting: Physical Therapy

## 2022-06-16 DIAGNOSIS — R262 Difficulty in walking, not elsewhere classified: Secondary | ICD-10-CM | POA: Diagnosis not present

## 2022-06-16 DIAGNOSIS — R29818 Other symptoms and signs involving the nervous system: Secondary | ICD-10-CM | POA: Diagnosis not present

## 2022-06-16 DIAGNOSIS — R2689 Other abnormalities of gait and mobility: Secondary | ICD-10-CM | POA: Diagnosis not present

## 2022-06-16 DIAGNOSIS — M6281 Muscle weakness (generalized): Secondary | ICD-10-CM | POA: Diagnosis not present

## 2022-06-16 DIAGNOSIS — M542 Cervicalgia: Secondary | ICD-10-CM | POA: Insufficient documentation

## 2022-06-16 NOTE — Therapy (Signed)
OUTPATIENT PHYSICAL THERAPY CERVICAL EVALUATION   Patient Name: [name] MRN: [id number] DOB:1951-02-13, 71 y.o., male Today's Date: 06/16/2022   PT End of Session - 06/16/22 1432     Visit Number 1    Number of Visits 16    Date for PT Re-Evaluation 08/11/22    Authorization Type [employer] Medicare (no auth, no VL)    PT Start Time 1432    PT Stop Time 1510    PT Time Calculation (min) 38 min    Activity Tolerance Patient tolerated treatment well;Patient limited by fatigue    Behavior During Therapy WFL for tasks assessed/performed             Past Medical History:  Diagnosis Date   Allergy    Carpal tunnel syndrome    Chronic back pain    Chronic combined systolic and diastolic congestive heart failure (HCC)    Chronic ITP (idiopathic thrombocytopenia) (HCC) 05/25/2015   Coronary artery disease    Coronary artery disease involving native coronary artery of native heart with angina pectoris ([place])    Degenerative disc disease    Epidural hematoma ([place])    C3-4 ACDF 07/19/21, presented 07/22/21 with BLE/UE weakness/epidural hematoma, s/p revision & hematoma evacuation   GERD (gastroesophageal reflux disease)    History of hiatal hernia    History of thrombocytopenia    Hypercholesteremia    Hypertension    Hypothyroid    Lumbar pain    Myocardial infarction ([place]) 2009   Obesity    Pneumonia    around age 32   S/P CABG x 3 04/27/2018   LIMA to LAD SVG to OM1 SVG to OM2   Shortness of breath dyspnea    Past Surgical History:  Procedure Laterality Date   ANTERIOR CERVICAL DECOMP/DISCECTOMY FUSION N/A 07/19/2021   Procedure: CERVICAL THREE-FOUR ANTERIOR CERVICAL DECOMPRESSION/DISCECTOMY FUSION;  Surgeon: [name], MD;  Location: [place];  Service: Neurosurgery;  Laterality: N/A;   ANTERIOR CERVICAL DECOMP/DISCECTOMY FUSION N/A 07/22/2021   Procedure: ANTERIOR CERVICAL DECOMPRESSION/DISCECTOMY REVISON FUSION  WITH EVACUATION OF EPIDURAL HEMATOMA;  Surgeon: [name], DO;  Location: [place];  Service: Neurosurgery;  Laterality: N/A;   BACK SURGERY     5 lumbas disc with cervical and lumbar fusions   BIOPSY N/A 03/06/2014   Procedure: ESOPHAGEAL BIOPSIES;  Surgeon: [name], MD;  Location: [facility];  Service: Endoscopy;  Laterality: N/A;   COLONOSCOPY     COLONOSCOPY WITH PROPOFOL N/A 03/06/2014   Procedure: COLONOSCOPY WITH PROPOFOL;  Surgeon: [name], MD;  Location: [facility];  Service: Endoscopy;  Laterality: N/A;  in cecum at 0807; total withdrawal time 9 minutes   CORONARY ANGIOPLASTY WITH STENT PLACEMENT     2000, and 2004 has 3 stents   CORONARY ARTERY BYPASS GRAFT N/A 04/27/2018   Procedure: CORONARY ARTERY BYPASS GRAFTING (CABG) x Three , using left internal mammary artery and right leg greater saphenous vein;  Surgeon: [name], MD;  Location: [place];  Service: Open Heart Surgery;  Laterality: N/A;   ESOPHAGOGASTRODUODENOSCOPY (EGD) WITH PROPOFOL N/A 03/06/2014   Procedure: ESOPHAGOGASTRODUODENOSCOPY (EGD) WITH PROPOFOL;  Surgeon: [name], MD;  Location: [facility];  Service: Endoscopy;  Laterality: N/A;   EYE SURGERY Right 2013   "lazy eye"   JOINT REPLACEMENT Left 2011   LEFT HEART CATH AND CORONARY ANGIOGRAPHY N/A 04/24/2018   Procedure: LEFT HEART CATH AND CORONARY ANGIOGRAPHY;  Surgeon: [name], MD;  Location: [facility]  INVASIVE CV LAB;  Service: Cardiovascular;  Laterality: N/A;   LUMBAR FUSION  11/01/2007   MALONEY DILATION N/A 03/06/2014   Procedure: MALONEY DILATION 54 french;  Surgeon: [name], MD;  Location: [facility];  Service: Endoscopy;  Laterality: N/A;   NECK SURGERY     POSTERIOR CERVICAL FUSION/FORAMINOTOMY N/A 04/01/2022   Procedure: Post Cervical Lami/Multi level - C3-C4 - C4-C5 and fusion with lateral mass screws and posterolateral arthrodesis;  Surgeon: [name], MD;  Location: [place];  Service: Neurosurgery;  Laterality: N/A;   TEE WITHOUT CARDIOVERSION N/A 04/27/2018   Procedure:  TRANSESOPHAGEAL ECHOCARDIOGRAM (TEE);  Surgeon: [name], MD;  Location: [place];  Service: Open Heart Surgery;  Laterality: N/A;   Patient Active Problem List   Diagnosis Date Noted   Myelopathy concurrent with and due to spinal stenosis of cervical region ([place]) 04/01/2022   Spasticity 12/10/2021   Foot drop, bilateral 12/10/2021   Wheelchair dependence 12/10/2021   Nerve pain 12/10/2021   Cervical myelopathy (HCC)    Hyponatremia    Neurogenic bladder    Pressure injury of skin 08/08/2021   Acute incomplete quadriplegia ([place]) 08/01/2021   Epidural hematoma 07/22/2021   Myelopathy ([place]) 07/19/2021   Callus 05/18/2021   Acute bacterial rhinosinusitis 10/05/2020   Cough in adult 10/05/2020   S/P CABG x 3 04/27/2018   Angina pectoris ([place]) 04/24/2018   Obesity    Chronic combined systolic and diastolic congestive heart failure ([place])    Peripheral edema 06/30/2017   Pedal edema 11/28/2016   Osteopenia 08/13/2015   Thrombocytopenia ([place]) 05/25/2015   Chronic ITP (idiopathic thrombocytopenia) ([place]) 05/25/2015   Hypothyroidism 08/29/2014   Diverticulitis of colon (without mention of hemorrhage)(562.11) 02/04/2014   Dysphagia, unspecified(787.20) 02/04/2014   Hyperlipidemia 11/18/2013   Dyspnea 01/02/2012   Palpitations 01/02/2012   Mixed hyperlipidemia 07/06/2011   Coronary artery disease involving native coronary artery of native heart with angina pectoris ([place])    TOTAL KNEE FOLLOW-UP 08/14/2008   Chronic back pain 07/23/2008   KNEE, ARTHRITIS, DEGEN./OSTEO 05/29/2008   JOINT EFFUSION, KNEE 04/30/2008   Pain in joint, lower leg 10/10/2007   Essential hypertension 10/09/2007    PCP: Sallee Lange MD  REFERRING PROVIDER: Eleonore Chiquito, NP   REFERRING DIAG: M54.12 (ICD-10-CM) - Radiculopathy, cervical region   THERAPY DIAG:  Cervicalgia  Difficulty in walking, not elsewhere classified  Muscle weakness (generalized)  Other abnormalities of gait and  mobility  Other symptoms and signs involving the nervous system  Rationale for Evaluation and Treatment Rehabilitation  ONSET DATE: 04/01/22  SUBJECTIVE:  SUBJECTIVE STATEMENT: Patient states he had to have another surgery due to herniated disc. His symptoms, walking have improved. Balance still remains off. Has been using rollator. He was using w/c before. This surgery went smoother than the other ones.   PERTINENT HISTORY:  Numerous spine surgery, quadriplegia, osteopenia, HTN, myelopathy   PAIN:  Are you having pain? Yes: NPRS scale: 3/10 Pain location: neck Pain description: burning, stinging Aggravating factors: movement Relieving factors: shower, warm water  PRECAUTIONS: Cervical and Fall  WEIGHT BEARING RESTRICTIONS No  FALLS:  Has patient fallen in last 6 months? No  LIVING ENVIRONMENT: Lives with: lives with their spouse Lives in: House/apartment Stairs:  ramp Has following equipment at home: Single point cane, Environmental consultant - 2 wheeled, Wheelchair (manual), and bed side commode  OCCUPATION: Retired  PLOF: Independent  PATIENT GOALS get walking better  OBJECTIVE:   Observation:   forward head, rounded shoulders  PATIENT SURVEYS:  Complete LEFS next session  COGNITION: Overall cognitive status: Within functional limits for tasks assessed   SENSATION: Grossly decreased bilateral LE  POSTURE: rounded shoulders, forward head, increased thoracic kyphosis, and increased cervical lordosis    CERVICAL ROM:   Active ROM A/PROM (deg) eval  Flexion 0% limited  Extension 50% limited  Right lateral flexion 90% limited  Left lateral flexion 90% limited  Right rotation 50% limited  Left rotation 50% limited   (Blank rows = not tested)  UPPER EXTREMITY ROM: grossly  decreased flexion/abduction to below 90 degrees bilaterally  Active ROM Right eval Left eval  Shoulder flexion    Shoulder extension    Shoulder abduction    Shoulder adduction    Shoulder extension    Shoulder internal rotation    Shoulder external rotation occiput occiput  Elbow flexion    Elbow extension    Wrist flexion    Wrist extension    Wrist ulnar deviation    Wrist radial deviation    Wrist pronation    Wrist supination     (Blank rows = not tested)  UPPER EXTREMITY MMT: ljimited shoulder ROM bilateral   MMT Right eval Left eval  Shoulder flexion 3- 3+  Shoulder extension    Shoulder abduction 3- 3+  Shoulder adduction    Shoulder extension    Shoulder internal rotation    Shoulder external rotation    Middle trapezius    Lower trapezius    Elbow flexion 4- 4+  Elbow extension 4- 4+  Wrist flexion    Wrist extension    Wrist ulnar deviation    Wrist radial deviation    Wrist pronation    Wrist supination    Grip strength     (Blank rows = not tested)   LOWER EXTREMITY MMT:    MMT Right eval Left eval  Hip flexion 4- 4  Hip extension    Hip abduction    Hip adduction    Hip internal rotation    Hip external rotation    Knee flexion 4+ 4-  Knee extension 4+ 4-  Ankle dorsiflexion 5 3+  Ankle plantarflexion    Ankle inversion    Ankle eversion     (Blank rows = not tested)     FUNCTIONAL TESTS:  5 times sit to stand: 23.14 seconds 2 minute walk test: 350 feet with rollator ; gait: trunk flexed, impaired foot clearance with RLE Transfer STS: labored with UE use Stairs: step too pattern with heavy UE use utilizing RLE, 7 inch steps  TODAY'S  TREATMENT:  06/16/22 evaluation   PATIENT EDUCATION:  Education details: Patient educated on exam findings, POC, scope of PT, HEP, and therapy expectations. Person educated: Patient Education method: Explanation, Demonstration, and Handouts Education comprehension: verbalized understanding,  returned demonstration, verbal cues required, and tactile cues required   HOME EXERCISE PROGRAM: Begin next session  ASSESSMENT:  CLINICAL IMPRESSION: Patient a 71 y.o. y.o. male who was seen today for physical therapy evaluation and treatment for cervical radiculopathy and weakness s/p C3-C4 and C4-C5 decompression/fusion. Patient presents with pain limited deficits in cervical spine and LE strength, ROM, endurance, activity tolerance, and functional mobility with ADL. Patient is having to modify and restrict ADL as indicated by outcome measure score as well as subjective information and objective measures which is affecting overall participation. Patient will benefit from skilled physical therapy in order to improve function and reduce impairment.    OBJECTIVE IMPAIRMENTS Abnormal gait, decreased activity tolerance, decreased balance, decreased coordination, decreased endurance, decreased mobility, difficulty walking, decreased ROM, decreased strength, increased muscle spasms, impaired flexibility, impaired sensation, impaired tone, improper body mechanics, and pain.   ACTIVITY LIMITATIONS carrying, lifting, bending, standing, squatting, stairs, transfers, hygiene/grooming, locomotion level, and caring for others  PARTICIPATION LIMITATIONS: meal prep, cleaning, laundry, driving, shopping, community activity, and yard work  PERSONAL FACTORS Past/current experiences, Time since onset of injury/illness/exacerbation, and 3+ comorbidities: Numerous spine surgery, quadriplegia, osteopenia, HTN, myelopathy   are also affecting patient's functional outcome.   REHAB POTENTIAL: Good  CLINICAL DECISION MAKING: Evolving/moderate complexity  EVALUATION COMPLEXITY: Moderate   GOALS: Goals reviewed with patient? Yes  SHORT TERM GOALS: Target date: 07/07/2022  Patient will be independent with HEP in order to improve functional outcomes. Baseline:  Goal status: INITIAL  2.  Patient will report at  least 25% improvement in symptoms for improved quality of life. Baseline:  Goal status: INITIAL   LONG TERM GOALS: Target date:08/11/2022  Patient will report at least 75% improvement in symptoms for improved quality of life. Baseline:  Goal status: INITIAL  2.  Patient will be able to complete 5x STS in under 15 seconds in order to reduce the risk of falls. Baseline: 23.14 seconds Goal status: INITIAL  3.  Patient will be able to ambulate at least 450 feet in 2MWT in order to demonstrate improved gait speed for community ambulation.  Baseline: 350 feet with rollator Goal status: INITIAL  4.  Patient will demo at least 4/5 BLE strength to improve safety and stability for standing ADL and gait  Baseline: see MMT Goal status: INITIAL  5.  Patient will be able to navigate stairs with reciprocal pattern in order to demonstrate improved LE strength. Baseline: see above Goal status: INITIAL       PLAN: PT FREQUENCY: 2x/week  PT DURATION: 8 weeks  PLANNED INTERVENTIONS: Therapeutic exercises, Therapeutic activity, Neuromuscular re-education, Balance training, Gait training, Patient/Family education, Joint manipulation, Joint mobilization, Stair training, Orthotic/Fit training, DME instructions, Aquatic Therapy, Dry Needling, Electrical stimulation, Spinal manipulation, Spinal mobilization, Cryotherapy, Moist heat, Compression bandaging, scar mobilization, Splintting, Taping, Traction, Ultrasound, Ionotophoresis '4mg'$ /ml Dexamethasone, and Manual therapy   PLAN FOR NEXT SESSION: test glute strength, do LEFS outcome measure; bilateral LE strength, balance and gait training, core and postural strengthening, progress as able   Mearl Latin, PT 06/16/2022, 3:26 PM

## 2022-06-21 ENCOUNTER — Other Ambulatory Visit: Payer: Self-pay | Admitting: Family Medicine

## 2022-06-21 ENCOUNTER — Ambulatory Visit (HOSPITAL_COMMUNITY): Payer: Medicare Other | Admitting: Physical Therapy

## 2022-06-21 ENCOUNTER — Encounter (HOSPITAL_COMMUNITY): Payer: Self-pay | Admitting: Physical Therapy

## 2022-06-21 DIAGNOSIS — M542 Cervicalgia: Secondary | ICD-10-CM

## 2022-06-21 DIAGNOSIS — R262 Difficulty in walking, not elsewhere classified: Secondary | ICD-10-CM

## 2022-06-21 DIAGNOSIS — R2689 Other abnormalities of gait and mobility: Secondary | ICD-10-CM | POA: Diagnosis not present

## 2022-06-21 DIAGNOSIS — R29818 Other symptoms and signs involving the nervous system: Secondary | ICD-10-CM | POA: Diagnosis not present

## 2022-06-21 DIAGNOSIS — M6281 Muscle weakness (generalized): Secondary | ICD-10-CM

## 2022-06-21 DIAGNOSIS — E559 Vitamin D deficiency, unspecified: Secondary | ICD-10-CM

## 2022-06-21 NOTE — Therapy (Signed)
OUTPATIENT PHYSICAL THERAPY TREATMENT NOTE   Patient Name: [name] MRN: [id number] DOB:1951-03-01, 71 y.o., male 19 Date: 06/21/2022  PCP: Sallee Lange MD REFERRING PROVIDER: Eleonore Chiquito, NP   END OF SESSION:   PT End of Session - 06/21/22 1432     Visit Number 2    Number of Visits 16    Date for PT Re-Evaluation 08/11/22    Authorization Type [employer] Medicare (no auth, no VL)    PT Start Time 1430    PT Stop Time 1510    PT Time Calculation (min) 40 min    Activity Tolerance Patient tolerated treatment well;Patient limited by fatigue    Behavior During Therapy WFL for tasks assessed/performed             Past Medical History:  Diagnosis Date   Allergy    Carpal tunnel syndrome    Chronic back pain    Chronic combined systolic and diastolic congestive heart failure (HCC)    Chronic ITP (idiopathic thrombocytopenia) (HCC) 05/25/2015   Coronary artery disease    Coronary artery disease involving native coronary artery of native heart with angina pectoris ([place])    Degenerative disc disease    Epidural hematoma ([place])    C3-4 ACDF 07/19/21, presented 07/22/21 with BLE/UE weakness/epidural hematoma, s/p revision & hematoma evacuation   GERD (gastroesophageal reflux disease)    History of hiatal hernia    History of thrombocytopenia    Hypercholesteremia    Hypertension    Hypothyroid    Lumbar pain    Myocardial infarction ([place]) 2009   Obesity    Pneumonia    around age 50   S/P CABG x 3 04/27/2018   LIMA to LAD SVG to OM1 SVG to OM2   Shortness of breath dyspnea    Past Surgical History:  Procedure Laterality Date   ANTERIOR CERVICAL DECOMP/DISCECTOMY FUSION N/A 07/19/2021   Procedure: CERVICAL THREE-FOUR ANTERIOR CERVICAL DECOMPRESSION/DISCECTOMY FUSION;  Surgeon: [name], MD;  Location: [facility];  Service: Neurosurgery;  Laterality: N/A;   ANTERIOR CERVICAL DECOMP/DISCECTOMY FUSION N/A 07/22/2021   Procedure: ANTERIOR CERVICAL  DECOMPRESSION/DISCECTOMY REVISON FUSION  WITH EVACUATION OF EPIDURAL HEMATOMA;  Surgeon: [name], DO;  Location: [facility];  Service: Neurosurgery;  Laterality: N/A;   BACK SURGERY     5 lumbas disc with cervical and lumbar fusions   BIOPSY N/A 03/06/2014   Procedure: ESOPHAGEAL BIOPSIES;  Surgeon: [name], MD;  Location: [facility];  Service: Endoscopy;  Laterality: N/A;   COLONOSCOPY     COLONOSCOPY WITH PROPOFOL N/A 03/06/2014   Procedure: COLONOSCOPY WITH PROPOFOL;  Surgeon: [name], MD;  Location: [facility];  Service: Endoscopy;  Laterality: N/A;  in cecum at 0807; total withdrawal time 9 minutes   CORONARY ANGIOPLASTY WITH STENT PLACEMENT     2000, and 2004 has 3 stents   CORONARY ARTERY BYPASS GRAFT N/A 04/27/2018   Procedure: CORONARY ARTERY BYPASS GRAFTING (CABG) x Three , using left internal mammary artery and right leg greater saphenous vein;  Surgeon: [name], MD;  Location: [facility];  Service: Open Heart Surgery;  Laterality: N/A;   ESOPHAGOGASTRODUODENOSCOPY (EGD) WITH PROPOFOL N/A 03/06/2014   Procedure: ESOPHAGOGASTRODUODENOSCOPY (EGD) WITH PROPOFOL;  Surgeon: [name], MD;  Location: [facility];  Service: Endoscopy;  Laterality: N/A;   EYE SURGERY Right 2013   "lazy eye"   JOINT REPLACEMENT Left 2011   LEFT HEART CATH AND CORONARY ANGIOGRAPHY N/A 04/24/2018  Procedure: LEFT HEART CATH AND CORONARY ANGIOGRAPHY;  Surgeon: [name], MD;  Location: [facility];  Service: Cardiovascular;  Laterality: N/A;   LUMBAR FUSION  11/01/2007   MALONEY DILATION N/A 03/06/2014   Procedure: MALONEY DILATION 54 french;  Surgeon: [name], MD;  Location: [facility];  Service: Endoscopy;  Laterality: N/A;   NECK SURGERY     POSTERIOR CERVICAL FUSION/FORAMINOTOMY N/A 04/01/2022   Procedure: Post Cervical Lami/Multi level - C3-C4 - C4-C5 and fusion with lateral mass screws and posterolateral arthrodesis;  Surgeon: [name], MD;  Location: [facility];   Service: Neurosurgery;  Laterality: N/A;   TEE WITHOUT CARDIOVERSION N/A 04/27/2018   Procedure: TRANSESOPHAGEAL ECHOCARDIOGRAM (TEE);  Surgeon: [name], MD;  Location: [facility];  Service: Open Heart Surgery;  Laterality: N/A;   Patient Active Problem List   Diagnosis Date Noted   Myelopathy concurrent with and due to spinal stenosis of cervical region ([place]) 04/01/2022   Spasticity 12/10/2021   Foot drop, bilateral 12/10/2021   Wheelchair dependence 12/10/2021   Nerve pain 12/10/2021   Cervical myelopathy (HCC)    Hyponatremia    Neurogenic bladder    Pressure injury of skin 08/08/2021   Acute incomplete quadriplegia ([place]) 08/01/2021   Epidural hematoma 07/22/2021   Myelopathy ([place]) 07/19/2021   Callus 05/18/2021   Acute bacterial rhinosinusitis 10/05/2020   Cough in adult 10/05/2020   S/P CABG x 3 04/27/2018   Angina pectoris ([place]) 04/24/2018   Obesity    Chronic combined systolic and diastolic congestive heart failure ([place])    Peripheral edema 06/30/2017   Pedal edema 11/28/2016   Osteopenia 08/13/2015   Thrombocytopenia ([place]) 05/25/2015   Chronic ITP (idiopathic thrombocytopenia) ([place]) 05/25/2015   Hypothyroidism 08/29/2014   Diverticulitis of colon (without mention of hemorrhage)(562.11) 02/04/2014   Dysphagia, unspecified(787.20) 02/04/2014   Hyperlipidemia 11/18/2013   Dyspnea 01/02/2012   Palpitations 01/02/2012   Mixed hyperlipidemia 07/06/2011   Coronary artery disease involving native coronary artery of native heart with angina pectoris ([place])    TOTAL KNEE FOLLOW-UP 08/14/2008   Chronic back pain 07/23/2008   KNEE, ARTHRITIS, DEGEN./OSTEO 05/29/2008   JOINT EFFUSION, KNEE 04/30/2008   Pain in joint, lower leg 10/10/2007   Essential hypertension 10/09/2007    REFERRING DIAG: M54.12 (ICD-10-CM) - Radiculopathy, cervical region   THERAPY DIAG:  Cervicalgia  Difficulty in walking, not elsewhere classified  Muscle weakness (generalized)  Other  abnormalities of gait and mobility  Rationale for Evaluation and Treatment Rehabilitation  PERTINENT HISTORY: Numerous spine surgery, quadriplegia, osteopenia, HTN, myelopathy   PRECAUTIONS: Cervical and Fall  SUBJECTIVE: Patient reports ongoing pain in LT arm. He is doing much better with his walking using rollator. He is apprehensive to progress from rollator for fear of falling.   PAIN:  Are you having pain? Yes: NPRS scale: 3/10 Pain location: neck Pain description: burning, stinging Aggravating factors: movement Relieving factors: shower, warm water OBJECTIVE:    Observation:   forward head, rounded shoulders   PATIENT SURVEYS:  LEFS 28/80   COGNITION: Overall cognitive status: Within functional limits for tasks assessed     SENSATION: Grossly decreased bilateral LE   POSTURE: rounded shoulders, forward head, increased thoracic kyphosis, and increased cervical lordosis       CERVICAL ROM:    Active ROM A/PROM (deg) eval  Flexion 0% limited  Extension 50% limited  Right lateral flexion 90% limited  Left lateral flexion 90% limited  Right rotation 50% limited  Left rotation  50% limited   (Blank rows = not tested)   UPPER EXTREMITY ROM: grossly decreased flexion/abduction to below 90 degrees bilaterally   Active ROM Right eval Left eval  Shoulder flexion      Shoulder extension      Shoulder abduction      Shoulder adduction      Shoulder extension      Shoulder internal rotation      Shoulder external rotation occiput occiput  Elbow flexion      Elbow extension      Wrist flexion      Wrist extension      Wrist ulnar deviation      Wrist radial deviation      Wrist pronation      Wrist supination       (Blank rows = not tested)   UPPER EXTREMITY MMT: ljimited shoulder ROM bilateral    MMT Right eval Left eval  Shoulder flexion 3- 3+  Shoulder extension      Shoulder abduction 3- 3+  Shoulder adduction      Shoulder extension       Shoulder internal rotation      Shoulder external rotation      Middle trapezius      Lower trapezius      Elbow flexion 4- 4+  Elbow extension 4- 4+  Wrist flexion      Wrist extension      Wrist ulnar deviation      Wrist radial deviation      Wrist pronation      Wrist supination      Grip strength       (Blank rows = not tested)     LOWER EXTREMITY MMT:     MMT Right eval Left eval  Hip flexion 4- 4  Hip extension      Hip abduction      Hip adduction      Hip internal rotation      Hip external rotation      Knee flexion 4+ 4-  Knee extension 4+ 4-  Ankle dorsiflexion 5 3+  Ankle plantarflexion      Ankle inversion      Ankle eversion       (Blank rows = not tested)        FUNCTIONAL TESTS:  5 times sit to stand: 23.14 seconds 2 minute walk test: 350 feet with rollator ; gait: trunk flexed, impaired foot clearance with RLE Transfer STS: labored with UE use Stairs: step too pattern with heavy UE use utilizing RLE, 7 inch steps   TODAY'S TREATMENT:  06/21/22 Goal review LEFS (28/80) Chin tuck x10  Scap retraction 10 x 5" Cervical AROM rotation/ flexion and extension x10 each   Sit to stand x10 min use of UE (fatigues at 7th rep)   Clinic amb 2 RT with rollator   Standing Rows GTB x20 Shoulder extension GTB x20   Nu step (seat 9) lv 3 5 min    06/16/22 evaluation     PATIENT EDUCATION:  Education details: Patient educated on exam findings, POC, scope of PT, HEP, and therapy expectations. Person educated: Patient Education method: Explanation, Demonstration, and Handouts Education comprehension: verbalized understanding, returned demonstration, verbal cues required, and tactile cues required     HOME EXERCISE PROGRAM: Access Code: [email or web address] URL: [email or web address] Date: 06/21/2022 Prepared by: Josue Hector  Exercises - Seated Passive Cervical Retraction  - 2 x daily - 7 x weekly - 1-2 sets -  10 reps - 5 second hold -  Seated Scapular Retraction  - 2 x daily - 7 x weekly - 1-2 sets - 10 reps - 5 second hold - Seated Cervical Rotation AROM  - 2 x daily - 7 x weekly - 1-2 sets - 10 reps - Seated Cervical Extension AROM  - 2 x daily - 7 x weekly - 1-2 sets - 10 reps   ASSESSMENT:   CLINICAL IMPRESSION: Patient tolerated session well today. Reviewed therapy goals. Completed LEFS. Developed HEP for cervical. Progressed functional strength and LE strengthening activity. Patient requires cues for pacing activity to not perform to fast for improved muscle activation. Patient noting mild fatigue end of session. Patient will continue to benefit from skilled therapy services to reduce remaining deficits and improve functional ability.     OBJECTIVE IMPAIRMENTS Abnormal gait, decreased activity tolerance, decreased balance, decreased coordination, decreased endurance, decreased mobility, difficulty walking, decreased ROM, decreased strength, increased muscle spasms, impaired flexibility, impaired sensation, impaired tone, improper body mechanics, and pain.    ACTIVITY LIMITATIONS carrying, lifting, bending, standing, squatting, stairs, transfers, hygiene/grooming, locomotion level, and caring for others   PARTICIPATION LIMITATIONS: meal prep, cleaning, laundry, driving, shopping, community activity, and yard work   PERSONAL FACTORS Past/current experiences, Time since onset of injury/illness/exacerbation, and 3+ comorbidities: Numerous spine surgery, quadriplegia, osteopenia, HTN, myelopathy   are also affecting patient's functional outcome.    REHAB POTENTIAL: Good   CLINICAL DECISION MAKING: Evolving/moderate complexity   EVALUATION COMPLEXITY: Moderate     GOALS: Goals reviewed with patient? Yes   SHORT TERM GOALS: Target date: 07/07/2022   Patient will be independent with HEP in order to improve functional outcomes. Baseline:  Goal status: INITIAL   2.  Patient will report at least 25% improvement in  symptoms for improved quality of life. Baseline:  Goal status: INITIAL     LONG TERM GOALS: Target date:08/11/2022   Patient will report at least 75% improvement in symptoms for improved quality of life. Baseline:  Goal status: INITIAL   2.  Patient will be able to complete 5x STS in under 15 seconds in order to reduce the risk of falls. Baseline: 23.14 seconds Goal status: INITIAL   3.  Patient will be able to ambulate at least 450 feet in 2MWT in order to demonstrate improved gait speed for community ambulation.  Baseline: 350 feet with rollator Goal status: INITIAL   4.  Patient will demo at least 4/5 BLE strength to improve safety and stability for standing ADL and gait  Baseline: see MMT Goal status: INITIAL   5.  Patient will be able to navigate stairs with reciprocal pattern in order to demonstrate improved LE strength. Baseline: see above Goal status: INITIAL           PLAN: PT FREQUENCY: 2x/week   PT DURATION: 8 weeks   PLANNED INTERVENTIONS: Therapeutic exercises, Therapeutic activity, Neuromuscular re-education, Balance training, Gait training, Patient/Family education, Joint manipulation, Joint mobilization, Stair training, Orthotic/Fit training, DME instructions, Aquatic Therapy, Dry Needling, Electrical stimulation, Spinal manipulation, Spinal mobilization, Cryotherapy, Moist heat, Compression bandaging, scar mobilization, Splintting, Taping, Traction, Ultrasound, Ionotophoresis '4mg'$ /ml Dexamethasone, and Manual therapy     PLAN FOR NEXT SESSION: bilateral LE strength, balance and gait training, core and postural strengthening, progress as able     2:33 PM, 06/21/22 Josue Hector PT DPT  Physical Therapist with [facility]  [phone]

## 2022-06-21 NOTE — Telephone Encounter (Signed)
May have this +1 refill Please add a vitamin D level to labs that were ordered in July he is to do those blood work before his follow-up office visit thank you

## 2022-06-24 ENCOUNTER — Ambulatory Visit (HOSPITAL_COMMUNITY): Payer: Medicare Other

## 2022-06-24 ENCOUNTER — Encounter (HOSPITAL_COMMUNITY): Payer: Self-pay

## 2022-06-24 DIAGNOSIS — M6281 Muscle weakness (generalized): Secondary | ICD-10-CM

## 2022-06-24 DIAGNOSIS — M542 Cervicalgia: Secondary | ICD-10-CM

## 2022-06-24 DIAGNOSIS — R2689 Other abnormalities of gait and mobility: Secondary | ICD-10-CM | POA: Diagnosis not present

## 2022-06-24 DIAGNOSIS — R262 Difficulty in walking, not elsewhere classified: Secondary | ICD-10-CM | POA: Diagnosis not present

## 2022-06-24 DIAGNOSIS — R29818 Other symptoms and signs involving the nervous system: Secondary | ICD-10-CM | POA: Diagnosis not present

## 2022-06-24 NOTE — Therapy (Signed)
OUTPATIENT PHYSICAL THERAPY TREATMENT NOTE   Patient Name: [name] MRN: [id number] DOB:[birth year], 71 y.o., male Today's Date: 06/24/2022  PCP: Sallee Lange MD REFERRING PROVIDER: Eleonore Chiquito, NP   END OF SESSION:    06/24/22 1431  PT Visits / Re-Eval  Visit Number 3  Number of Visits 16  Date for PT Re-Evaluation 08/11/22  Authorization  Authorization Type [employer] Medicare (no auth, no VL)  PT Time Calculation  PT Start Time 9563  PT Stop Time 1512  PT Time Calculation (min) 39 min  PT - End of Session  Activity Tolerance Patient tolerated treatment well;Patient limited by fatigue  Behavior During Therapy WFL for tasks assessed/performed    Past Medical History:  Diagnosis Date   Allergy    Carpal tunnel syndrome    Chronic back pain    Chronic combined systolic and diastolic congestive heart failure (HCC)    Chronic ITP (idiopathic thrombocytopenia) (HCC) 05/25/2015   Coronary artery disease    Coronary artery disease involving native coronary artery of native heart with angina pectoris ([place])    Degenerative disc disease    Epidural hematoma ([place])    C3-4 ACDF 07/19/21, presented 07/22/21 with BLE/UE weakness/epidural hematoma, s/p revision & hematoma evacuation   GERD (gastroesophageal reflux disease)    History of hiatal hernia    History of thrombocytopenia    Hypercholesteremia    Hypertension    Hypothyroid    Lumbar pain    Myocardial infarction ([place]) 2009   Obesity    Pneumonia    around age 14   S/P CABG x 3 04/27/2018   LIMA to LAD SVG to OM1 SVG to OM2   Shortness of breath dyspnea    Past Surgical History:  Procedure Laterality Date   ANTERIOR CERVICAL DECOMP/DISCECTOMY FUSION N/A 07/19/2021   Procedure: CERVICAL THREE-FOUR ANTERIOR CERVICAL DECOMPRESSION/DISCECTOMY FUSION;  Surgeon: [name], MD;  Location: [facility];  Service: Neurosurgery;  Laterality: N/A;   ANTERIOR CERVICAL DECOMP/DISCECTOMY FUSION N/A 07/22/2021   Procedure:  ANTERIOR CERVICAL DECOMPRESSION/DISCECTOMY REVISON FUSION  WITH EVACUATION OF EPIDURAL HEMATOMA;  Surgeon: [name], DO;  Location: [facility];  Service: Neurosurgery;  Laterality: N/A;   BACK SURGERY     5 lumbas disc with cervical and lumbar fusions   BIOPSY N/A 03/06/2014   Procedure: ESOPHAGEAL BIOPSIES;  Surgeon: [name], MD;  Location: [facility];  Service: Endoscopy;  Laterality: N/A;   COLONOSCOPY     COLONOSCOPY WITH PROPOFOL N/A 03/06/2014   Procedure: COLONOSCOPY WITH PROPOFOL;  Surgeon: [name], MD;  Location: [facility];  Service: Endoscopy;  Laterality: N/A;  in cecum at 0807; total withdrawal time 9 minutes   CORONARY ANGIOPLASTY WITH STENT PLACEMENT     2000, and 2004 has 3 stents   CORONARY ARTERY BYPASS GRAFT N/A 04/27/2018   Procedure: CORONARY ARTERY BYPASS GRAFTING (CABG) x Three , using left internal mammary artery and right leg greater saphenous vein;  Surgeon: [name], MD;  Location: [facility];  Service: Open Heart Surgery;  Laterality: N/A;   ESOPHAGOGASTRODUODENOSCOPY (EGD) WITH PROPOFOL N/A 03/06/2014   Procedure: ESOPHAGOGASTRODUODENOSCOPY (EGD) WITH PROPOFOL;  Surgeon: [name], MD;  Location: [facility];  Service: Endoscopy;  Laterality: N/A;   EYE SURGERY Right 2013   "lazy eye"   JOINT REPLACEMENT Left 2011   LEFT HEART CATH AND CORONARY ANGIOGRAPHY N/A 04/24/2018   Procedure: LEFT HEART CATH AND CORONARY ANGIOGRAPHY;  Surgeon: [name], MD;  Location:  [facility];  Service: Cardiovascular;  Laterality: N/A;   LUMBAR FUSION  11/01/2007   MALONEY DILATION N/A 03/06/2014   Procedure: MALONEY DILATION 54 french;  Surgeon: [name], MD;  Location: [facility];  Service: Endoscopy;  Laterality: N/A;   NECK SURGERY     POSTERIOR CERVICAL FUSION/FORAMINOTOMY N/A 04/01/2022   Procedure: Post Cervical Lami/Multi level - C3-C4 - C4-C5 and fusion with lateral mass screws and posterolateral arthrodesis;  Surgeon: [name], MD;   Location: [facility];  Service: Neurosurgery;  Laterality: N/A;   TEE WITHOUT CARDIOVERSION N/A 04/27/2018   Procedure: TRANSESOPHAGEAL ECHOCARDIOGRAM (TEE);  Surgeon: [name], MD;  Location: [facility];  Service: Open Heart Surgery;  Laterality: N/A;   Patient Active Problem List   Diagnosis Date Noted   Myelopathy concurrent with and due to spinal stenosis of cervical region ([place]) 04/01/2022   Spasticity 12/10/2021   Foot drop, bilateral 12/10/2021   Wheelchair dependence 12/10/2021   Nerve pain 12/10/2021   Cervical myelopathy (HCC)    Hyponatremia    Neurogenic bladder    Pressure injury of skin 08/08/2021   Acute incomplete quadriplegia ([place]) 08/01/2021   Epidural hematoma 07/22/2021   Myelopathy ([place]) 07/19/2021   Callus 05/18/2021   Acute bacterial rhinosinusitis 10/05/2020   Cough in adult 10/05/2020   S/P CABG x 3 04/27/2018   Angina pectoris ([place]) 04/24/2018   Obesity    Chronic combined systolic and diastolic congestive heart failure ([place])    Peripheral edema 06/30/2017   Pedal edema 11/28/2016   Osteopenia 08/13/2015   Thrombocytopenia ([place]) 05/25/2015   Chronic ITP (idiopathic thrombocytopenia) ([place]) 05/25/2015   Hypothyroidism 08/29/2014   Diverticulitis of colon (without mention of hemorrhage)(562.11) 02/04/2014   Dysphagia, unspecified(787.20) 02/04/2014   Hyperlipidemia 11/18/2013   Dyspnea 01/02/2012   Palpitations 01/02/2012   Mixed hyperlipidemia 07/06/2011   Coronary artery disease involving native coronary artery of native heart with angina pectoris ([place])    TOTAL KNEE FOLLOW-UP 08/14/2008   Chronic back pain 07/23/2008   KNEE, ARTHRITIS, DEGEN./OSTEO 05/29/2008   JOINT EFFUSION, KNEE 04/30/2008   Pain in joint, lower leg 10/10/2007   Essential hypertension 10/09/2007    REFERRING DIAG: M54.12 (ICD-10-CM) - Radiculopathy, cervical region   THERAPY DIAG:  No diagnosis found.  Rationale for Evaluation and Treatment Rehabilitation  PERTINENT  HISTORY: Numerous spine surgery, quadriplegia, osteopenia, HTN, myelopathy   PRECAUTIONS: Cervical and Fall  SUBJECTIVE: Patient reports ongoing pain in LT arm. He is doing much better with his walking using rollator. He is apprehensive to progress from rollator for fear of falling.   Pt reports he has pain in left side of neck and radicular symptoms down to elbow.  Reports continued numbness both hands for over a year and half.  PAIN:  Are you having pain? Yes: NPRS scale: 2/10 Pain location: neck Pain description: burning, stinging Aggravating factors: movement Relieving factors: shower, warm water OBJECTIVE:    Observation:   forward head, rounded shoulders   PATIENT SURVEYS:  LEFS 28/80   COGNITION: Overall cognitive status: Within functional limits for tasks assessed     SENSATION: Grossly decreased bilateral LE   POSTURE: rounded shoulders, forward head, increased thoracic kyphosis, and increased cervical lordosis       CERVICAL ROM:    Active ROM A/PROM (deg) eval  Flexion 0% limited  Extension 50% limited  Right lateral flexion 90% limited  Left lateral flexion 90% limited  Right rotation 50% limited  Left rotation 50% limited   (  Blank rows = not tested)   UPPER EXTREMITY ROM: grossly decreased flexion/abduction to below 90 degrees bilaterally   Active ROM Right eval Left eval  Shoulder flexion      Shoulder extension      Shoulder abduction      Shoulder adduction      Shoulder extension      Shoulder internal rotation      Shoulder external rotation occiput occiput  Elbow flexion      Elbow extension      Wrist flexion      Wrist extension      Wrist ulnar deviation      Wrist radial deviation      Wrist pronation      Wrist supination       (Blank rows = not tested)   UPPER EXTREMITY MMT: ljimited shoulder ROM bilateral    MMT Right eval Left eval  Shoulder flexion 3- 3+  Shoulder extension      Shoulder abduction 3- 3+  Shoulder  adduction      Shoulder extension      Shoulder internal rotation      Shoulder external rotation      Middle trapezius      Lower trapezius      Elbow flexion 4- 4+  Elbow extension 4- 4+  Wrist flexion      Wrist extension      Wrist ulnar deviation      Wrist radial deviation      Wrist pronation      Wrist supination      Grip strength       (Blank rows = not tested)     LOWER EXTREMITY MMT:     MMT Right eval Left eval  Hip flexion 4- 4  Hip extension      Hip abduction      Hip adduction      Hip internal rotation      Hip external rotation      Knee flexion 4+ 4-  Knee extension 4+ 4-  Ankle dorsiflexion 5 3+  Ankle plantarflexion      Ankle inversion      Ankle eversion       (Blank rows = not tested)        FUNCTIONAL TESTS:  5 times sit to stand: 23.14 seconds 2 minute walk test: 350 feet with rollator ; gait: trunk flexed, impaired foot clearance with RLE Transfer STS: labored with UE use Stairs: step too pattern with heavy UE use utilizing RLE, 7 inch steps   TODAY'S TREATMENT:  06/24/22:   SEated posture education Rows 2x 10 5" holds Chin tuck (cueing for mechanics) Wback 10  Supine:  Chin tuck - HEP Bridge 10x- HEP  Standing: STS 10x no HHA (fatigue at 9th rep Sidestep 10x front of counter Wall push-ups 2x 5 Chin tuck front of wall tactile cueing 5x 5" Standing tall for 1 minute     06/21/22 Goal review LEFS (28/80) Chin tuck x10  Scap retraction 10 x 5" Cervical AROM rotation/ flexion and extension x10 each   Sit to stand x10 min use of UE (fatigues at 7th rep)   Clinic amb 2 RT with rollator   Standing Rows GTB x20 Shoulder extension GTB x20   Nu step (seat 9) lv 3 5 min    06/16/22 evaluation     PATIENT EDUCATION:  Education details: 06/24/22: Educated importance of seated and standing posture for pain control  and to reduce risk of falls during gait.   Patient educated on exam findings, POC, scope of PT, HEP, and  therapy expectations. Person educated: Patient Education method: Explanation, Demonstration, and Handouts Education comprehension: verbalized understanding, returned demonstration, verbal cues required, and tactile cues required     HOME EXERCISE PROGRAM: Access Code: [email or web address] URL: [email or web address] Date: 06/21/2022 Prepared by: Josue Hector  Exercises - Seated Passive Cervical Retraction  - 2 x daily - 7 x weekly - 1-2 sets - 10 reps - 5 second hold - Seated Scapular Retraction  - 2 x daily - 7 x weekly - 1-2 sets - 10 reps - 5 second hold - Seated Cervical Rotation AROM  - 2 x daily - 7 x weekly - 1-2 sets - 10 reps - Seated Cervical Extension AROM  - 2 x daily - 7 x weekly - 1-2 sets - 10 reps  Supine: chin tuck, bridge   ASSESSMENT:   CLINICAL IMPRESSION: Pt educated importance of posture for pain control and balance training.  Pt presents with increased difficulty with seated chin tucks, improved mechanics with supine position. Added chin tucks and bridges for gluteal strengthening to HEP.  PT able to complete all exercises with no report of increased pain, was limited by fatigue with periodic seated rest breaks required.    OBJECTIVE IMPAIRMENTS Abnormal gait, decreased activity tolerance, decreased balance, decreased coordination, decreased endurance, decreased mobility, difficulty walking, decreased ROM, decreased strength, increased muscle spasms, impaired flexibility, impaired sensation, impaired tone, improper body mechanics, and pain.    ACTIVITY LIMITATIONS carrying, lifting, bending, standing, squatting, stairs, transfers, hygiene/grooming, locomotion level, and caring for others   PARTICIPATION LIMITATIONS: meal prep, cleaning, laundry, driving, shopping, community activity, and yard work   PERSONAL FACTORS Past/current experiences, Time since onset of injury/illness/exacerbation, and 3+ comorbidities: Numerous spine surgery, quadriplegia,  osteopenia, HTN, myelopathy   are also affecting patient's functional outcome.    REHAB POTENTIAL: Good   CLINICAL DECISION MAKING: Evolving/moderate complexity   EVALUATION COMPLEXITY: Moderate     GOALS: Goals reviewed with patient? Yes   SHORT TERM GOALS: Target date: 07/07/2022   Patient will be independent with HEP in order to improve functional outcomes. Baseline:  Goal status: IN PROGRESS   2.  Patient will report at least 25% improvement in symptoms for improved quality of life. Baseline:  Goal status:IN PROGRESS     LONG TERM GOALS: Target date:08/11/2022   Patient will report at least 75% improvement in symptoms for improved quality of life. Baseline:  Goal status: IN PROGRESS   2.  Patient will be able to complete 5x STS in under 15 seconds in order to reduce the risk of falls. Baseline: 23.14 seconds Goal status: IN PROGRESS   3.  Patient will be able to ambulate at least 450 feet in 2MWT in order to demonstrate improved gait speed for community ambulation.  Baseline: 350 feet with rollator Goal status: IN PROGRESS   4.  Patient will demo at least 4/5 BLE strength to improve safety and stability for standing ADL and gait  Baseline: see MMT Goal status: IN PROGRESS   5.  Patient will be able to navigate stairs with reciprocal pattern in order to demonstrate improved LE strength. Baseline: see above Goal status: IN PROGRESS           PLAN: PT FREQUENCY: 2x/week   PT DURATION: 8 weeks   PLANNED INTERVENTIONS: Therapeutic exercises, Therapeutic activity, Neuromuscular re-education, Balance training, Gait training, Patient/Family education, Joint manipulation, Joint  mobilization, Stair training, Orthotic/Fit training, DME instructions, Aquatic Therapy, Dry Needling, Electrical stimulation, Spinal manipulation, Spinal mobilization, Cryotherapy, Moist heat, Compression bandaging, scar mobilization, Splintting, Taping, Traction, Ultrasound, Ionotophoresis  '4mg'$ /ml Dexamethasone, and Manual therapy     PLAN FOR NEXT SESSION: bilateral LE strength, balance and gait training, core and postural strengthening, progress as able.  Add isometrics for cervical strengthening.    Ihor Austin, LPTA/CLT; Delana Meyer [phone]  2:32 PM, 06/24/22

## 2022-06-28 ENCOUNTER — Ambulatory Visit (HOSPITAL_COMMUNITY): Payer: Medicare Other

## 2022-06-28 ENCOUNTER — Ambulatory Visit (INDEPENDENT_AMBULATORY_CARE_PROVIDER_SITE_OTHER): Payer: Medicare Other

## 2022-06-28 ENCOUNTER — Encounter (HOSPITAL_COMMUNITY): Payer: Self-pay

## 2022-06-28 ENCOUNTER — Other Ambulatory Visit: Payer: Self-pay | Admitting: *Deleted

## 2022-06-28 VITALS — Wt 247.0 lb

## 2022-06-28 DIAGNOSIS — N319 Neuromuscular dysfunction of bladder, unspecified: Secondary | ICD-10-CM

## 2022-06-28 DIAGNOSIS — R2689 Other abnormalities of gait and mobility: Secondary | ICD-10-CM | POA: Diagnosis not present

## 2022-06-28 DIAGNOSIS — M6281 Muscle weakness (generalized): Secondary | ICD-10-CM

## 2022-06-28 DIAGNOSIS — M542 Cervicalgia: Secondary | ICD-10-CM

## 2022-06-28 DIAGNOSIS — R262 Difficulty in walking, not elsewhere classified: Secondary | ICD-10-CM | POA: Diagnosis not present

## 2022-06-28 DIAGNOSIS — Z0000 Encounter for general adult medical examination without abnormal findings: Secondary | ICD-10-CM | POA: Diagnosis not present

## 2022-06-28 DIAGNOSIS — R29818 Other symptoms and signs involving the nervous system: Secondary | ICD-10-CM | POA: Diagnosis not present

## 2022-06-28 MED ORDER — EZETIMIBE 10 MG PO TABS: 10.0000 mg | ORAL_TABLET | Freq: Every day | ORAL | 0 refills | Status: DC

## 2022-06-28 NOTE — Patient Instructions (Signed)
[name] , Thank you for taking time to come for your Medicare Wellness Visit. I appreciate your ongoing commitment to your health goals. Please review the following plan we discussed and let me know if I can assist you in the future.   Screening recommendations/referrals: Colonoscopy: 03/17/20 Recommended yearly ophthalmology/optometry visit for glaucoma screening and checkup Recommended yearly dental visit for hygiene and checkup  Vaccinations: Influenza vaccine: n/d Pneumococcal vaccine: 01/05/22 Tdap vaccine: 08/13/21 Shingles vaccine: n/d   Covid-19: 01/11/20, 02/11/20  Advanced directives: no  Conditions/risks identified: none  Next appointment: Follow up in one year for your annual wellness visit. 07/04/23 @ 1pm by phone  Preventive Care 65 Years and Older, Male Preventive care refers to lifestyle choices and visits with your health care provider that can promote health and wellness. What does preventive care include? A yearly physical exam. This is also called an annual well check. Dental exams once or twice a year. Routine eye exams. Ask your health care provider how often you should have your eyes checked. Personal lifestyle choices, including: Daily care of your teeth and gums. Regular physical activity. Eating a healthy diet. Avoiding tobacco and drug use. Limiting alcohol use. Practicing safe sex. Taking low doses of aspirin every day. Taking vitamin and mineral supplements as recommended by your health care provider. What happens during an annual well check? The services and screenings done by your health care provider during your annual well check will depend on your age, overall health, lifestyle risk factors, and family history of disease. Counseling  Your health care provider may ask you questions about your: Alcohol use. Tobacco use. Drug use. Emotional well-being. Home and relationship well-being. Sexual activity. Eating habits. History of falls. Memory  and ability to understand (cognition). Work and work Statistician. Screening  You may have the following tests or measurements: Height, weight, and BMI. Blood pressure. Lipid and cholesterol levels. These may be checked every 5 years, or more frequently if you are over 39 years old. Skin check. Lung cancer screening. You may have this screening every year starting at age 45 if you have a 30-pack-year history of smoking and currently smoke or have quit within the past 15 years. Fecal occult blood test (FOBT) of the stool. You may have this test every year starting at age 21. Flexible sigmoidoscopy or colonoscopy. You may have a sigmoidoscopy every 5 years or a colonoscopy every 10 years starting at age 24. Prostate cancer screening. Recommendations will vary depending on your family history and other risks. Hepatitis C blood test. Hepatitis B blood test. Sexually transmitted disease (STD) testing. Diabetes screening. This is done by checking your blood sugar (glucose) after you have not eaten for a while (fasting). You may have this done every 1-3 years. Abdominal aortic aneurysm (AAA) screening. You may need this if you are a current or former smoker. Osteoporosis. You may be screened starting at age 39 if you are at high risk. Talk with your health care provider about your test results, treatment options, and if necessary, the need for more tests. Vaccines  Your health care provider may recommend certain vaccines, such as: Influenza vaccine. This is recommended every year. Tetanus, diphtheria, and acellular pertussis (Tdap, Td) vaccine. You may need a Td booster every 10 years. Zoster vaccine. You may need this after age 31. Pneumococcal 13-valent conjugate (PCV13) vaccine. One dose is recommended after age 87. Pneumococcal polysaccharide (PPSV23) vaccine. One dose is recommended after age 77. Talk to your health care provider about  which screenings and vaccines you need and how often you  need them. This information is not intended to replace advice given to you by your health care provider. Make sure you discuss any questions you have with your health care provider. Document Released: 11/13/2015 Document Revised: 07/06/2016 Document Reviewed: 08/18/2015 Elsevier Interactive Patient Education  2017 [employer] Prevention in the Home Falls can cause injuries. They can happen to people of all ages. There are many things you can do to make your home safe and to help prevent falls. What can I do on the outside of my home? Regularly fix the edges of walkways and driveways and fix any cracks. Remove anything that might make you trip as you walk through a door, such as a raised step or threshold. Trim any bushes or trees on the path to your home. Use bright outdoor lighting. Clear any walking paths of anything that might make someone trip, such as rocks or tools. Regularly check to see if handrails are loose or broken. Make sure that both sides of any steps have handrails. Any raised decks and porches should have guardrails on the edges. Have any leaves, snow, or ice cleared regularly. Use sand or salt on walking paths during winter. Clean up any spills in your garage right away. This includes oil or grease spills. What can I do in the bathroom? Use night lights. Install grab bars by the toilet and in the tub and shower. Do not use towel bars as grab bars. Use non-skid mats or decals in the tub or shower. If you need to sit down in the shower, use a plastic, non-slip stool. Keep the floor dry. Clean up any water that spills on the floor as soon as it happens. Remove soap buildup in the tub or shower regularly. Attach bath mats securely with double-sided non-slip rug tape. Do not have throw rugs and other things on the floor that can make you trip. What can I do in the bedroom? Use night lights. Make sure that you have a light by your bed that is easy to reach. Do not  use any sheets or blankets that are too big for your bed. They should not hang down onto the floor. Have a firm chair that has side arms. You can use this for support while you get dressed. Do not have throw rugs and other things on the floor that can make you trip. What can I do in the kitchen? Clean up any spills right away. Avoid walking on wet floors. Keep items that you use a lot in easy-to-reach places. If you need to reach something above you, use a strong step stool that has a grab bar. Keep electrical cords out of the way. Do not use floor polish or wax that makes floors slippery. If you must use wax, use non-skid floor wax. Do not have throw rugs and other things on the floor that can make you trip. What can I do with my stairs? Do not leave any items on the stairs. Make sure that there are handrails on both sides of the stairs and use them. Fix handrails that are broken or loose. Make sure that handrails are as long as the stairways. Check any carpeting to make sure that it is firmly attached to the stairs. Fix any carpet that is loose or worn. Avoid having throw rugs at the top or bottom of the stairs. If you do have throw rugs, attach them to the floor with  carpet tape. Make sure that you have a light switch at the top of the stairs and the bottom of the stairs. If you do not have them, ask someone to add them for you. What else can I do to help prevent falls? Wear shoes that: Do not have high heels. Have rubber bottoms. Are comfortable and fit you well. Are closed at the toe. Do not wear sandals. If you use a stepladder: Make sure that it is fully opened. Do not climb a closed stepladder. Make sure that both sides of the stepladder are locked into place. Ask someone to hold it for you, if possible. Clearly [place] and make sure that you can see: Any grab bars or handrails. First and last steps. Where the edge of each step is. Use tools that help you move around (mobility  aids) if they are needed. These include: Canes. Walkers. Scooters. Crutches. Turn on the lights when you go into a dark area. Replace any light bulbs as soon as they burn out. Set up your furniture so you have a clear path. Avoid moving your furniture around. If any of your floors are uneven, fix them. If there are any pets around you, be aware of where they are. Review your medicines with your doctor. Some medicines can make you feel dizzy. This can increase your chance of falling. Ask your doctor what other things that you can do to help prevent falls. This information is not intended to replace advice given to you by your health care provider. Make sure you discuss any questions you have with your health care provider. Document Released: 08/13/2009 Document Revised: 03/24/2016 Document Reviewed: 11/21/2014 Elsevier Interactive Patient Education  2017 [employer].

## 2022-06-28 NOTE — Therapy (Signed)
OUTPATIENT PHYSICAL THERAPY TREATMENT NOTE   Patient Name: [name] MRN: [id number] DOB:04-25-1951, 71 y.o., male Today's Date: 06/28/2022  PCP: Sallee Lange MD REFERRING PROVIDER: Eleonore Chiquito, NP   END OF SESSION:    06/28/22 1614  PT Visits / Re-Eval  Visit Number 4  Number of Visits 16  Date for PT Re-Evaluation 08/11/22  Authorization  Authorization Type [employer] Medicare (no auth, no VL)  PT Time Calculation  PT Start Time 5427  PT Stop Time 1640  PT Time Calculation (min) 45 min  PT - End of Session  Activity Tolerance Patient tolerated treatment well;Patient limited by fatigue  Behavior During Therapy WFL for tasks assessed/performed    Past Medical History:  Diagnosis Date   Allergy    Carpal tunnel syndrome    Chronic back pain    Chronic combined systolic and diastolic congestive heart failure (HCC)    Chronic ITP (idiopathic thrombocytopenia) ([place]) 05/25/2015   Coronary artery disease    Coronary artery disease involving native coronary artery of native heart with angina pectoris ([place])    Degenerative disc disease    Epidural hematoma ([place])    C3-4 ACDF 07/19/21, presented 07/22/21 with BLE/UE weakness/epidural hematoma, s/p revision & hematoma evacuation   GERD (gastroesophageal reflux disease)    History of hiatal hernia    History of thrombocytopenia    Hypercholesteremia    Hypertension    Hypothyroid    Lumbar pain    Myocardial infarction ([place]) 2009   Obesity    Pneumonia    around age 58   S/P CABG x 3 04/27/2018   LIMA to LAD SVG to OM1 SVG to OM2   Shortness of breath dyspnea    Past Surgical History:  Procedure Laterality Date   ANTERIOR CERVICAL DECOMP/DISCECTOMY FUSION N/A 07/19/2021   Procedure: CERVICAL THREE-FOUR ANTERIOR CERVICAL DECOMPRESSION/DISCECTOMY FUSION;  Surgeon: [name], MD;  Location: [facility];  Service: Neurosurgery;  Laterality: N/A;   ANTERIOR CERVICAL DECOMP/DISCECTOMY FUSION N/A 07/22/2021   Procedure:  ANTERIOR CERVICAL DECOMPRESSION/DISCECTOMY REVISON FUSION  WITH EVACUATION OF EPIDURAL HEMATOMA;  Surgeon: [name], DO;  Location: [facility];  Service: Neurosurgery;  Laterality: N/A;   BACK SURGERY     5 lumbas disc with cervical and lumbar fusions   BIOPSY N/A 03/06/2014   Procedure: ESOPHAGEAL BIOPSIES;  Surgeon: [name], MD;  Location: [facility];  Service: Endoscopy;  Laterality: N/A;   COLONOSCOPY     COLONOSCOPY WITH PROPOFOL N/A 03/06/2014   Procedure: COLONOSCOPY WITH PROPOFOL;  Surgeon: [name], MD;  Location: [facility];  Service: Endoscopy;  Laterality: N/A;  in cecum at 0807; total withdrawal time 9 minutes   CORONARY ANGIOPLASTY WITH STENT PLACEMENT     2000, and 2004 has 3 stents   CORONARY ARTERY BYPASS GRAFT N/A 04/27/2018   Procedure: CORONARY ARTERY BYPASS GRAFTING (CABG) x Three , using left internal mammary artery and right leg greater saphenous vein;  Surgeon: [name], MD;  Location: [facility];  Service: Open Heart Surgery;  Laterality: N/A;   ESOPHAGOGASTRODUODENOSCOPY (EGD) WITH PROPOFOL N/A 03/06/2014   Procedure: ESOPHAGOGASTRODUODENOSCOPY (EGD) WITH PROPOFOL;  Surgeon: [name], MD;  Location: [facility];  Service: Endoscopy;  Laterality: N/A;   EYE SURGERY Right 2013   "lazy eye"   JOINT REPLACEMENT Left 2011   LEFT HEART CATH AND CORONARY ANGIOGRAPHY N/A 04/24/2018   Procedure: LEFT HEART CATH AND CORONARY ANGIOGRAPHY;  Surgeon: [name], MD;  Location:  [facility];  Service: Cardiovascular;  Laterality: N/A;   LUMBAR FUSION  11/01/2007   MALONEY DILATION N/A 03/06/2014   Procedure: MALONEY DILATION 54 french;  Surgeon: [name], MD;  Location: [facility];  Service: Endoscopy;  Laterality: N/A;   NECK SURGERY     POSTERIOR CERVICAL FUSION/FORAMINOTOMY N/A 04/01/2022   Procedure: Post Cervical Lami/Multi level - C3-C4 - C4-C5 and fusion with lateral mass screws and posterolateral arthrodesis;  Surgeon: [name], MD;   Location: [facility];  Service: Neurosurgery;  Laterality: N/A;   TEE WITHOUT CARDIOVERSION N/A 04/27/2018   Procedure: TRANSESOPHAGEAL ECHOCARDIOGRAM (TEE);  Surgeon: [name], MD;  Location: [facility];  Service: Open Heart Surgery;  Laterality: N/A;   Patient Active Problem List   Diagnosis Date Noted   Myelopathy concurrent with and due to spinal stenosis of cervical region ([place]) 04/01/2022   Spasticity 12/10/2021   Foot drop, bilateral 12/10/2021   Wheelchair dependence 12/10/2021   Nerve pain 12/10/2021   Cervical myelopathy (HCC)    Hyponatremia    Neurogenic bladder    Pressure injury of skin 08/08/2021   Acute incomplete quadriplegia ([place]) 08/01/2021   Epidural hematoma 07/22/2021   Myelopathy ([place]) 07/19/2021   Callus 05/18/2021   Acute bacterial rhinosinusitis 10/05/2020   Cough in adult 10/05/2020   S/P CABG x 3 04/27/2018   Angina pectoris ([place]) 04/24/2018   Obesity    Chronic combined systolic and diastolic congestive heart failure ([place])    Peripheral edema 06/30/2017   Pedal edema 11/28/2016   Osteopenia 08/13/2015   Thrombocytopenia ([place]) 05/25/2015   Chronic ITP (idiopathic thrombocytopenia) ([place]) 05/25/2015   Hypothyroidism 08/29/2014   Diverticulitis of colon (without mention of hemorrhage)(562.11) 02/04/2014   Dysphagia, unspecified(787.20) 02/04/2014   Hyperlipidemia 11/18/2013   Dyspnea 01/02/2012   Palpitations 01/02/2012   Mixed hyperlipidemia 07/06/2011   Coronary artery disease involving native coronary artery of native heart with angina pectoris ([place])    TOTAL KNEE FOLLOW-UP 08/14/2008   Chronic back pain 07/23/2008   KNEE, ARTHRITIS, DEGEN./OSTEO 05/29/2008   JOINT EFFUSION, KNEE 04/30/2008   Pain in joint, lower leg 10/10/2007   Essential hypertension 10/09/2007    REFERRING DIAG: M54.12 (ICD-10-CM) - Radiculopathy, cervical region   THERAPY DIAG:  Cervicalgia  Difficulty in walking, not elsewhere classified  Muscle weakness  (generalized)  Other abnormalities of gait and mobility  Rationale for Evaluation and Treatment Rehabilitation  PERTINENT HISTORY: Numerous spine surgery, quadriplegia, osteopenia, HTN, myelopathy   PRECAUTIONS: Cervical and Fall  SUBJECTIVE: Patient reports ongoing pain in LT arm. He is doing much better with his walking using rollator. He is apprehensive to progress from rollator for fear of falling.  06/28/22:  Pt stated he continues to have radicular symptoms down to Lt elbow.  Reports increased pain when laying on Rt side of body.   Pt reports he has pain in left side of neck and radicular symptoms down to elbow.  Reports continued numbness both hands for over a year and half.  PAIN:  Are you having pain? Yes: NPRS scale: 3/10 Pain location: neck and radicular symptoms down to Lt elbow Pain description: burning, stinging Aggravating factors: movement Relieving factors: shower, warm water OBJECTIVE:    Observation:   forward head, rounded shoulders   PATIENT SURVEYS:  LEFS 28/80   COGNITION: Overall cognitive status: Within functional limits for tasks assessed     SENSATION: Grossly decreased bilateral LE   POSTURE: rounded shoulders, forward head, increased thoracic kyphosis, and  increased cervical lordosis       CERVICAL ROM:    Active ROM A/PROM (deg) eval  Flexion 0% limited  Extension 50% limited  Right lateral flexion 90% limited  Left lateral flexion 90% limited  Right rotation 50% limited  Left rotation 50% limited   (Blank rows = not tested)   UPPER EXTREMITY ROM: grossly decreased flexion/abduction to below 90 degrees bilaterally   Active ROM Right eval Left eval  Shoulder flexion      Shoulder extension      Shoulder abduction      Shoulder adduction      Shoulder extension      Shoulder internal rotation      Shoulder external rotation occiput occiput  Elbow flexion      Elbow extension      Wrist flexion      Wrist extension      Wrist  ulnar deviation      Wrist radial deviation      Wrist pronation      Wrist supination       (Blank rows = not tested)   UPPER EXTREMITY MMT: ljimited shoulder ROM bilateral    MMT Right eval Left eval  Shoulder flexion 3- 3+  Shoulder extension      Shoulder abduction 3- 3+  Shoulder adduction      Shoulder extension      Shoulder internal rotation      Shoulder external rotation      Middle trapezius      Lower trapezius      Elbow flexion 4- 4+  Elbow extension 4- 4+  Wrist flexion      Wrist extension      Wrist ulnar deviation      Wrist radial deviation      Wrist pronation      Wrist supination      Grip strength       (Blank rows = not tested)     LOWER EXTREMITY MMT:     MMT Right eval Left eval  Hip flexion 4- 4  Hip extension      Hip abduction      Hip adduction      Hip internal rotation      Hip external rotation      Knee flexion 4+ 4-  Knee extension 4+ 4-  Ankle dorsiflexion 5 3+  Ankle plantarflexion      Ankle inversion      Ankle eversion       (Blank rows = not tested)        FUNCTIONAL TESTS:  5 times sit to stand: 23.14 seconds 2 minute walk test: 350 feet with rollator ; gait: trunk flexed, impaired foot clearance with RLE Transfer STS: labored with UE use Stairs: step too pattern with heavy UE use utilizing RLE, 7 inch steps   TODAY'S TREATMENT:  06/27/22: Seated posture Shoulders up, back (Scapular retraction) and relax 10x 3" Chin tuck (cueing for mechanics) RTB rows 2x10 5" holds Isometric cervical rotation 5x5"   Sidebend   Extension  UE flexion/ abd for seated posture 10x Sitting on dynadisc paloff RTB 20x 2   10x STS no HHA Standing no HHA front of mat 3RT (SBA with therapist) Squat with HHA on walker  Sidelying hip abd    06/24/22:   SEated posture education Rows 2x 10 5" holds Chin tuck (cueing for mechanics) Wback 10  Supine:  Chin tuck - HEP Bridge 10x- HEP  Standing: STS 10x no HHA (fatigue at  9th rep Sidestep 10x front of counter Wall push-ups 2x 5 Chin tuck front of wall tactile cueing 5x 5" Standing tall for 1 minute     06/21/22 Goal review LEFS (28/80) Chin tuck x10  Scap retraction 10 x 5" Cervical AROM rotation/ flexion and extension x10 each   Sit to stand x10 min use of UE (fatigues at 7th rep)   Clinic amb 2 RT with rollator   Standing Rows GTB x20 Shoulder extension GTB x20   Nu step (seat 9) lv 3 5 min    06/16/22 evaluation     PATIENT EDUCATION:  Education details: 06/24/22: Educated importance of seated and standing posture for pain control and to reduce risk of falls during gait.   Patient educated on exam findings, POC, scope of PT, HEP, and therapy expectations. Person educated: Patient Education method: Explanation, Demonstration, and Handouts Education comprehension: verbalized understanding, returned demonstration, verbal cues required, and tactile cues required     HOME EXERCISE PROGRAM: Access Code: [email or web address] URL: [email or web address] Date: 06/21/2022 Prepared by: Josue Hector  Exercises - Seated Passive Cervical Retraction  - 2 x daily - 7 x weekly - 1-2 sets - 10 reps - 5 second hold - Seated Scapular Retraction  - 2 x daily - 7 x weekly - 1-2 sets - 10 reps - 5 second hold - Seated Cervical Rotation AROM  - 2 x daily - 7 x weekly - 1-2 sets - 10 reps - Seated Cervical Extension AROM  - 2 x daily - 7 x weekly - 1-2 sets - 10 reps  Supine: chin tuck, bridge  06/28/22:    ASSESSMENT:   CLINICAL IMPRESSION: Continued session focus with cervical/postural strengthening and balance training.  Pt required cueing to improve awareness of posture upon rest with exercises, educated importance of keeping good posture to improve strengthening.  Added seated paloff on dynadisc for seated balance and core strengthening.  Sidestep complete without HHA this session with therapist in front SBA with good standing balance.  Pt does  present with weak proximal strengthening, increased difficulty with sidelying hip abd due to weakness.  No reports of increased pain through session.    OBJECTIVE IMPAIRMENTS Abnormal gait, decreased activity tolerance, decreased balance, decreased coordination, decreased endurance, decreased mobility, difficulty walking, decreased ROM, decreased strength, increased muscle spasms, impaired flexibility, impaired sensation, impaired tone, improper body mechanics, and pain.    ACTIVITY LIMITATIONS carrying, lifting, bending, standing, squatting, stairs, transfers, hygiene/grooming, locomotion level, and caring for others   PARTICIPATION LIMITATIONS: meal prep, cleaning, laundry, driving, shopping, community activity, and yard work   PERSONAL FACTORS Past/current experiences, Time since onset of injury/illness/exacerbation, and 3+ comorbidities: Numerous spine surgery, quadriplegia, osteopenia, HTN, myelopathy   are also affecting patient's functional outcome.    REHAB POTENTIAL: Good   CLINICAL DECISION MAKING: Evolving/moderate complexity   EVALUATION COMPLEXITY: Moderate     GOALS: Goals reviewed with patient? Yes   SHORT TERM GOALS: Target date: 07/07/2022   Patient will be independent with HEP in order to improve functional outcomes. Baseline:  Goal status: IN PROGRESS   2.  Patient will report at least 25% improvement in symptoms for improved quality of life. Baseline:  Goal status:IN PROGRESS     LONG TERM GOALS: Target date:08/11/2022   Patient will report at least 75% improvement in symptoms for improved quality of life. Baseline:  Goal status: IN PROGRESS   2.  Patient will be able to complete 5x  STS in under 15 seconds in order to reduce the risk of falls. Baseline: 23.14 seconds Goal status: IN PROGRESS   3.  Patient will be able to ambulate at least 450 feet in 2MWT in order to demonstrate improved gait speed for community ambulation.  Baseline: 350 feet with  rollator Goal status: IN PROGRESS   4.  Patient will demo at least 4/5 BLE strength to improve safety and stability for standing ADL and gait  Baseline: see MMT Goal status: IN PROGRESS   5.  Patient will be able to navigate stairs with reciprocal pattern in order to demonstrate improved LE strength. Baseline: see above Goal status: IN PROGRESS           PLAN: PT FREQUENCY: 2x/week   PT DURATION: 8 weeks   PLANNED INTERVENTIONS: Therapeutic exercises, Therapeutic activity, Neuromuscular re-education, Balance training, Gait training, Patient/Family education, Joint manipulation, Joint mobilization, Stair training, Orthotic/Fit training, DME instructions, Aquatic Therapy, Dry Needling, Electrical stimulation, Spinal manipulation, Spinal mobilization, Cryotherapy, Moist heat, Compression bandaging, scar mobilization, Splintting, Taping, Traction, Ultrasound, Ionotophoresis '4mg'$ /ml Dexamethasone, and Manual therapy     PLAN FOR NEXT SESSION: bilateral LE strength, balance and gait training, core and postural strengthening, progress as able.      Ihor Austin, LPTA/CLT; CBIS [phone]  4:55 PM, 06/28/22

## 2022-06-28 NOTE — Progress Notes (Signed)
Virtual Visit via Telephone Note  I connected with  [name] on 06/28/22 at  2:45 PM EDT by telephone and verified that I am speaking with the correct person using two identifiers.  Location: Patient: home Provider: [facility] Persons participating in the virtual visit: patient/Nurse Health Advisor   I discussed the limitations, risks, security and privacy concerns of performing an evaluation and management service by telephone and the availability of in person appointments. The patient expressed understanding and agreed to proceed.  Interactive audio and video telecommunications were attempted between this nurse and patient, however failed, due to patient having technical difficulties OR patient did not have access to video capability.  We continued and completed visit with audio only.  Some vital signs may be absent or patient reported.   Dionisio David, LPN  Subjective:   [name] is a 71 y.o. male who presents for Medicare Annual/Subsequent preventive examination.  Review of Systems     Cardiac Risk Factors include: advanced age (>75mn, >>97women);male gender;hypertension     Objective:    There were no vitals filed for this visit. There is no height or weight on file to calculate BMI.     06/28/2022    2:51 PM 04/01/2022    3:21 PM 03/23/2022    3:14 PM 09/14/2021    2:47 PM 08/04/2021    2:00 PM 08/04/2021   12:00 PM 07/22/2021    9:19 AM  Advanced Directives  Does Patient Have a Medical Advance Directive? No Yes Yes Yes Yes Yes Yes  Type of AArmed forces technical officerof [place]Living will Healthcare Power of [place] AOrdof [place] Does patient want to make changes to medical advance directive?  No - Patient declined No - Patient declined No - Patient declined No - Patient declined No - Patient declined No - Patient declined  Copy of HMassapequain Chart?  No - copy requested No - copy requested No - copy requested No - copy requested    Would patient like information on creating a medical advance directive? No - Patient declined   No - Patient declined No - Patient declined  No - Patient declined    Current Medications (verified) Outpatient Encounter Medications as of 06/28/2022  Medication Sig   acetaminophen (TYLENOL) 325 MG tablet Take 2 tablets (650 mg total) by mouth every 6 (six) hours as needed for mild pain (or Fever >/= 101). (Patient taking differently: Take 1,000 mg by mouth every 6 (six) hours as needed for mild pain (or Fever >/= 101).)   albuterol (VENTOLIN HFA) 108 (90 Base) MCG/ACT inhaler Inhale 2 puffs into the lungs every 6 (six) hours as needed for wheezing or shortness of breath.   alendronate (FOSAMAX) 70 MG tablet Take 1 tablet (70 mg total) by mouth every 7 (seven) days. Take with a full glass of water on an empty stomach.   baclofen (LIORESAL) 10 MG tablet Take 1 tablet (10 mg total) by mouth 3 (three) times daily. X 1 weeks- then can increase to 10 mg 3x/day- for spasticity   cholecalciferol (VITAMIN D3) 25 MCG (1000 UNIT) tablet Take 1,000 Units by mouth daily.   ezetimibe (ZETIA) 10 MG tablet Take 1 tablet (10 mg total) by mouth daily.   gabapentin (NEURONTIN) 600 MG tablet Take 1 tablet (600 mg total) by mouth 3 (three) times daily.   levothyroxine (  SYNTHROID) 150 MCG tablet TAKE 1 TABLET BY MOUTH EVERY DAY   lisinopril (ZESTRIL) 20 MG tablet Take 1 tablet (20 mg total) by mouth daily.   metoprolol tartrate (LOPRESSOR) 50 MG tablet TAKE 1 TABLET(50 MG) BY MOUTH TWICE DAILY   Multiple Vitamin (MULTIVITAMIN) capsule Take 1 capsule by mouth daily.     nitroGLYCERIN (NITROSTAT) 0.4 MG SL tablet PLACE 1 TABLET UNDER THE TONGUE EVERY 5 MINUTES AS NEEDED FOR CHEST PAINS. (Patient taking differently: Place 0.4 mg under the tongue every 5 (five) minutes as needed for chest pain.)   pantoprazole (PROTONIX) 40  MG tablet Take 1 tablet (40 mg total) by mouth daily.   polycarbophil (FIBERCON) 625 MG tablet Take 1 tablet (625 mg total) by mouth daily.   potassium chloride (KLOR-CON M) 10 MEQ tablet TAKE 1 TABLET(10 MEQ) BY MOUTH DAILY   rosuvastatin (CRESTOR) 40 MG tablet TAKE 1 TABLET(40 MG) BY MOUTH EVERY EVENING   sertraline (ZOLOFT) 100 MG tablet TAKE ONE TABLET BY MOUTH ONCE A DAY   tamsulosin (FLOMAX) 0.4 MG CAPS capsule TAKE 2 CAPSULES BY MOUTH AT NIGHT   torsemide (DEMADEX) 20 MG tablet TAKE 2 TABLETS(40 MG) BY MOUTH DAILY   Vitamin D, Ergocalciferol, (DRISDOL) 1.25 MG (50000 UNIT) CAPS capsule TAKE 1 CAPSULE BY MOUTH 1 TIME EVERY WEEK   enoxaparin (LOVENOX) 60 MG/0.6ML injection Lovenox 50 mg daily through 11/20/2021 and stop   oxyCODONE-acetaminophen (PERCOCET) 7.5-325 MG tablet Take 1 tablet by mouth every 4 (four) hours as needed for severe pain. (Patient not taking: Reported on 06/28/2022)   No facility-administered encounter medications on file as of 06/28/2022.    Allergies (verified) Penicillins, Sulfa antibiotics, and Zoloft [sertraline hcl]   History: Past Medical History:  Diagnosis Date   Allergy    Carpal tunnel syndrome    Chronic back pain    Chronic combined systolic and diastolic congestive heart failure (HCC)    Chronic ITP (idiopathic thrombocytopenia) (HCC) 05/25/2015   Coronary artery disease    Coronary artery disease involving native coronary artery of native heart with angina pectoris (HCC)    Degenerative disc disease    Epidural hematoma (HCC)    C3-4 ACDF 07/19/21, presented 07/22/21 with BLE/UE weakness/epidural hematoma, s/p revision & hematoma evacuation   GERD (gastroesophageal reflux disease)    History of hiatal hernia    History of thrombocytopenia    Hypercholesteremia    Hypertension    Hypothyroid    Lumbar pain    Myocardial infarction ([place]) 2009   Obesity    Pneumonia    around age 38   S/P CABG x 3 04/27/2018   LIMA to LAD SVG to OM1 SVG to  OM2   Shortness of breath dyspnea    Past Surgical History:  Procedure Laterality Date   ANTERIOR CERVICAL DECOMP/DISCECTOMY FUSION N/A 07/19/2021   Procedure: CERVICAL THREE-FOUR ANTERIOR CERVICAL DECOMPRESSION/DISCECTOMY FUSION;  Surgeon: [name], MD;  Location: [facility];  Service: Neurosurgery;  Laterality: N/A;   ANTERIOR CERVICAL DECOMP/DISCECTOMY FUSION N/A 07/22/2021   Procedure: ANTERIOR CERVICAL DECOMPRESSION/DISCECTOMY REVISON FUSION  WITH EVACUATION OF EPIDURAL HEMATOMA;  Surgeon: [name], DO;  Location: [facility];  Service: Neurosurgery;  Laterality: N/A;   BACK SURGERY     5 lumbas disc with cervical and lumbar fusions   BIOPSY N/A 03/06/2014   Procedure: ESOPHAGEAL BIOPSIES;  Surgeon: [name], MD;  Location: [facility];  Service: Endoscopy;  Laterality: N/A;   COLONOSCOPY     COLONOSCOPY WITH PROPOFOL  N/A 03/06/2014   Procedure: COLONOSCOPY WITH PROPOFOL;  Surgeon: [name], MD;  Location: [facility];  Service: Endoscopy;  Laterality: N/A;  in cecum at 0807; total withdrawal time 9 minutes   CORONARY ANGIOPLASTY WITH STENT PLACEMENT     2000, and 2004 has 3 stents   CORONARY ARTERY BYPASS GRAFT N/A 04/27/2018   Procedure: CORONARY ARTERY BYPASS GRAFTING (CABG) x Three , using left internal mammary artery and right leg greater saphenous vein;  Surgeon: [name], MD;  Location: [facility];  Service: Open Heart Surgery;  Laterality: N/A;   ESOPHAGOGASTRODUODENOSCOPY (EGD) WITH PROPOFOL N/A 03/06/2014   Procedure: ESOPHAGOGASTRODUODENOSCOPY (EGD) WITH PROPOFOL;  Surgeon: [name], MD;  Location: [facility];  Service: Endoscopy;  Laterality: N/A;   EYE SURGERY Right 2013   "lazy eye"   JOINT REPLACEMENT Left 2011   LEFT HEART CATH AND CORONARY ANGIOGRAPHY N/A 04/24/2018   Procedure: LEFT HEART CATH AND CORONARY ANGIOGRAPHY;  Surgeon: [name], MD;  Location: [facility];  Service: Cardiovascular;  Laterality: N/A;   LUMBAR FUSION  11/01/2007    MALONEY DILATION N/A 03/06/2014   Procedure: MALONEY DILATION 54 french;  Surgeon: [name], MD;  Location: [facility];  Service: Endoscopy;  Laterality: N/A;   NECK SURGERY     POSTERIOR CERVICAL FUSION/FORAMINOTOMY N/A 04/01/2022   Procedure: Post Cervical Lami/Multi level - C3-C4 - C4-C5 and fusion with lateral mass screws and posterolateral arthrodesis;  Surgeon: [name], MD;  Location: [facility];  Service: Neurosurgery;  Laterality: N/A;   TEE WITHOUT CARDIOVERSION N/A 04/27/2018   Procedure: TRANSESOPHAGEAL ECHOCARDIOGRAM (TEE);  Surgeon: [name], MD;  Location: [facility];  Service: Open Heart Surgery;  Laterality: N/A;   Family History  Problem Relation Age of Onset   Heart attack Father    Heart disease Father    Colon cancer Maternal Grandmother    Esophageal cancer Neg Hx    Rectal cancer Neg Hx    Stomach cancer Neg Hx    Social History   Socioeconomic History   Marital status: Married    Spouse name: Not on file   Number of children: Not on file   Years of education: Not on file   Highest education level: Not on file  Occupational History   Occupation: retireed  Tobacco Use   Smoking status: Former    Packs/day: 2.00    Years: 20.00    Total pack years: 40.00    Types: Cigarettes    Quit date: 07/06/1991    Years since quitting: 31.0   Smokeless tobacco: Never  Vaping Use   Vaping Use: Never used  Substance and Sexual Activity   Alcohol use: Yes    Comment: occassional   Drug use: No   Sexual activity: Not Currently    Birth control/protection: None  Other Topics Concern   Not on file  Social History Narrative   Disabled due to chronic back pain   Social Determinants of Health   Financial Resource Strain: Low Risk  (06/28/2022)   Overall Financial Resource Strain (CARDIA)    Difficulty of Paying Living Expenses: Not hard at all  Food Insecurity: No Food Insecurity (06/28/2022)   Hunger Vital Sign    Worried About Running Out of Food in the Last  Year: Never true    [place] in the Last Year: Never true  Transportation Needs: No Transportation Needs (06/28/2022)   PRAPARE - Transportation  Lack of Transportation (Medical): No    Lack of Transportation (Non-Medical): No  Physical Activity: Insufficiently Active (06/28/2022)   Exercise Vital Sign    Days of Exercise per Week: 2 days    Minutes of Exercise per Session: 60 min  Stress: No Stress Concern Present (06/28/2022)   [place]    Feeling of Stress : Not at all  Social Connections: Moderately Integrated (06/28/2022)   Social Connection and Isolation Panel [NHANES]    Frequency of Communication with Friends and Family: More than three times a week    Frequency of Social Gatherings with Friends and Family: More than three times a week    Attends Religious Services: More than 4 times per year    Active Member of [employer] or Organizations: No    Attends Music therapist: Never    Marital Status: Married    Tobacco Counseling Counseling given: Not Answered   Clinical Intake:  Pre-visit preparation completed: Yes  Pain : No/denies pain     Nutritional Risks: None Diabetes: No  How often do you need to have someone help you when you read instructions, pamphlets, or other written materials from your doctor or pharmacy?: 1 - Never  Diabetic?no  Interpreter Needed?: No  Information entered by :: Kirke Shaggy, LPN   Activities of Daily Living    06/28/2022    2:52 PM 04/01/2022    3:21 PM  In your present state of health, do you have any difficulty performing the following activities:  Hearing? 0 0  Vision? 0 0  Difficulty concentrating or making decisions? 0 0  Walking or climbing stairs? 1 1  Dressing or bathing? 0 0  Doing errands, shopping? 0 1  Preparing Food and eating ? N   Using the Toilet? N   In the past six months, have you accidently leaked urine? N   Do you have  problems with loss of bowel control? N   Managing your Medications? N   Managing your Finances? N   Housekeeping or managing your Housekeeping? Y     Patient Care Team: Kathyrn Drown, MD as PCP - General (Family Medicine) Josue Hector, MD as PCP - Cardiology (Cardiology) Loletha Carrow Kirke Corin, MD as Consulting Physician (Gastroenterology) Michael Boston, MD as Consulting Physician (General Surgery) Josue Hector, MD as Consulting Physician (Cardiology) Wyatt Portela, MD as Consulting Physician (Hematology and Oncology)  Indicate any recent Medical Services you may have received from other than [facility] providers in the past year (date may be approximate).     Assessment:   This is a routine wellness examination for [name].  Hearing/Vision screen Hearing Screening - Comments:: No aids Vision Screening - Comments:: Wears glasses- [facility] in [place]  Dietary issues and exercise activities discussed: Current Exercise Habits: Home exercise routine, Time (Minutes): 60, Frequency (Times/Week): 2, Weekly Exercise (Minutes/Week): 120, Intensity: Mild   Goals Addressed             This Visit's Progress    DIET - EAT MORE FRUITS AND VEGETABLES         Depression Screen    06/28/2022    2:49 PM 12/10/2021    2:41 PM 09/06/2021    1:05 PM 04/30/2021    1:29 PM 06/11/2018   10:12 AM 08/29/2014   10:21 PM  PHQ 2/9 Scores  PHQ - 2 Score 1 0 1 0 0 0  PHQ- 9 Score 1  3  0     Fall Risk    06/28/2022    2:52 PM 12/10/2021    2:41 PM 09/06/2021    1:05 PM 05/12/2021    8:37 AM 04/30/2021    1:28 PM  Fall Risk   Falls in the past year? 0 '1 1 1 1  '$ Number falls in past yr: 0 '1 1 1 '$ 0  Injury with Fall? 0 '1 1 1 1  '$ Comment   broken ribs    Risk for fall due to : No Fall Risks History of fall(s)  History of fall(s) No Fall Risks  Follow up Falls prevention discussed   Falls evaluation completed;Education provided Falls evaluation completed    FALL RISK PREVENTION PERTAINING TO THE  HOME:  Any stairs in or around the home? No  If so, are there any without handrails? No  Home free of loose throw rugs in walkways, pet beds, electrical cords, etc? Yes  Adequate lighting in your home to reduce risk of falls? Yes   ASSISTIVE DEVICES UTILIZED TO PREVENT FALLS:  Life alert? No  Use of a cane, walker or w/c? Yes  Grab bars in the bathroom? Yes  Shower chair or bench in shower? Yes  Elevated toilet seat or a handicapped toilet? Yes    Cognitive Function:        06/28/2022    2:54 PM  6CIT Screen  What Year? 0 points  What month? 0 points  What time? 0 points  Count back from 20 0 points  Months in reverse 0 points  Repeat phrase 0 points  Total Score 0 points    Immunizations Immunization History  Administered Date(s) Administered   Influenza Split 08/29/2013   Influenza,inj,Quad PF,6+ Mos 08/29/2014, 08/13/2015, 06/30/2017, 07/27/2019   Influenza-Unspecified 08/30/2004, 10/28/2016, 10/06/2018, 08/14/2020   Moderna Sars-Covid-2 Vaccination 01/11/2020, 02/11/2020   PNEUMOCOCCAL CONJUGATE-20 01/05/2022   Pneumococcal Polysaccharide-23 07/16/2007, 04/02/2020   Td 08/13/2021    TDAP status: Up to date  Flu Vaccine status: Declined, Education has been provided regarding the importance of this vaccine but patient still declined. Advised may receive this vaccine at local pharmacy or Health Dept. Aware to provide a copy of the vaccination record if obtained from local pharmacy or Health Dept. Verbalized acceptance and understanding.  Pneumococcal vaccine status: Up to date  Covid-19 vaccine status: Completed vaccines  Qualifies for Shingles Vaccine? Yes   Zostavax completed No   Shingrix Completed?: No.    Education has been provided regarding the importance of this vaccine. Patient has been advised to call insurance company to determine out of pocket expense if they have not yet received this vaccine. Advised may also receive vaccine at local pharmacy or  Health Dept. Verbalized acceptance and understanding.  Screening Tests Health Maintenance  Topic Date Due   Zoster Vaccines- Shingrix (1 of 2) Never done   COVID-19 Vaccine (3 - Moderna risk series) 03/10/2020   INFLUENZA VACCINE  05/31/2022   COLONOSCOPY (Pts 45-82yr Insurance coverage will need to be confirmed)  03/18/2027   TETANUS/TDAP  08/14/2031   Pneumonia Vaccine 71 Years old  Completed   Hepatitis C Screening  Completed   HPV VACCINES  Aged Out    Health Maintenance  Health Maintenance Due  Topic Date Due   Zoster Vaccines- Shingrix (1 of 2) Never done   COVID-19 Vaccine (3 - Moderna risk series) 03/10/2020   INFLUENZA VACCINE  05/31/2022    Colorectal cancer screening: Type of screening: Colonoscopy. Completed 03/17/20.  Repeat every 10 years  Lung Cancer Screening: (Low Dose CT Chest recommended if Age 87-80 years, 30 pack-year currently smoking OR have quit w/in 15years.) does not qualify.   Additional Screening:  Hepatitis C Screening: does qualify; Completed 07/26/19  Vision Screening: Recommended annual ophthalmology exams for early detection of glaucoma and other disorders of the eye. Is the patient up to date with their annual eye exam?  Yes  Who is the provider or what is the name of the office in which the patient attends annual eye exams? [facility] in [place] If pt is not established with a provider, would they like to be referred to a provider to establish care? No .   Dental Screening: Recommended annual dental exams for proper oral hygiene  Community Resource Referral / Chronic Care Management: CRR required this visit?  No   CCM required this visit?  No      Plan:     I have personally reviewed and noted the following in the patient's chart:   Medical and social history Use of alcohol, tobacco or illicit drugs  Current medications and supplements including opioid prescriptions. Patient is currently taking opioid prescriptions. Information  provided to patient regarding non-opioid alternatives. Patient advised to discuss non-opioid treatment plan with their provider. Functional ability and status Nutritional status Physical activity Advanced directives List of other physicians Hospitalizations, surgeries, and ER visits in previous 12 months Vitals Screenings to include cognitive, depression, and falls Referrals and appointments  In addition, I have reviewed and discussed with patient certain preventive protocols, quality metrics, and best practice recommendations. A written personalized care plan for preventive services as well as general preventive health recommendations were provided to patient.     Dionisio David, LPN   [id number]   Nurse Notes: none

## 2022-06-30 ENCOUNTER — Ambulatory Visit (HOSPITAL_COMMUNITY): Payer: Medicare Other

## 2022-06-30 ENCOUNTER — Encounter (HOSPITAL_COMMUNITY): Payer: Self-pay

## 2022-06-30 DIAGNOSIS — R2689 Other abnormalities of gait and mobility: Secondary | ICD-10-CM

## 2022-06-30 DIAGNOSIS — R29818 Other symptoms and signs involving the nervous system: Secondary | ICD-10-CM | POA: Diagnosis not present

## 2022-06-30 DIAGNOSIS — M6281 Muscle weakness (generalized): Secondary | ICD-10-CM

## 2022-06-30 DIAGNOSIS — M542 Cervicalgia: Secondary | ICD-10-CM

## 2022-06-30 DIAGNOSIS — R262 Difficulty in walking, not elsewhere classified: Secondary | ICD-10-CM | POA: Diagnosis not present

## 2022-06-30 NOTE — Therapy (Addendum)
OUTPATIENT PHYSICAL THERAPY TREATMENT NOTE   Patient Name: [name] MRN: [id number] DOB:1951-01-25, 71 y.o., male Today's Date: 06/30/2022  PCP: Sallee Lange MD REFERRING PROVIDER: Eleonore Chiquito, NP   END OF SESSION:     06/30/22 1730  PT Visits / Re-Eval  Visit Number 5  Number of Visits 16  Date for PT Re-Evaluation 08/11/22  Authorization  Authorization Type [employer] Medicare (no auth, no VL)  PT Time Calculation  PT Start Time 4235  PT Stop Time 3614  PT Time Calculation (min) 38 min  PT - End of Session  Activity Tolerance Patient tolerated treatment well;Patient limited by fatigue  Behavior During Therapy WFL for tasks assessed/performed    Past Medical History:  Diagnosis Date   Allergy    Carpal tunnel syndrome    Chronic back pain    Chronic combined systolic and diastolic congestive heart failure (HCC)    Chronic ITP (idiopathic thrombocytopenia) ([place]) 05/25/2015   Coronary artery disease    Coronary artery disease involving native coronary artery of native heart with angina pectoris ([place])    Degenerative disc disease    Epidural hematoma ([place])    C3-4 ACDF 07/19/21, presented 07/22/21 with BLE/UE weakness/epidural hematoma, s/p revision & hematoma evacuation   GERD (gastroesophageal reflux disease)    History of hiatal hernia    History of thrombocytopenia    Hypercholesteremia    Hypertension    Hypothyroid    Lumbar pain    Myocardial infarction ([place]) 2009   Obesity    Pneumonia    around age 51   S/P CABG x 3 04/27/2018   LIMA to LAD SVG to OM1 SVG to OM2   Shortness of breath dyspnea    Past Surgical History:  Procedure Laterality Date   ANTERIOR CERVICAL DECOMP/DISCECTOMY FUSION N/A 07/19/2021   Procedure: CERVICAL THREE-FOUR ANTERIOR CERVICAL DECOMPRESSION/DISCECTOMY FUSION;  Surgeon: [name], MD;  Location: [facility];  Service: Neurosurgery;  Laterality: N/A;   ANTERIOR CERVICAL DECOMP/DISCECTOMY FUSION N/A 07/22/2021   Procedure:  ANTERIOR CERVICAL DECOMPRESSION/DISCECTOMY REVISON FUSION  WITH EVACUATION OF EPIDURAL HEMATOMA;  Surgeon: [name], DO;  Location: [facility];  Service: Neurosurgery;  Laterality: N/A;   BACK SURGERY     5 lumbas disc with cervical and lumbar fusions   BIOPSY N/A 03/06/2014   Procedure: ESOPHAGEAL BIOPSIES;  Surgeon: [name], MD;  Location: [facility];  Service: Endoscopy;  Laterality: N/A;   COLONOSCOPY     COLONOSCOPY WITH PROPOFOL N/A 03/06/2014   Procedure: COLONOSCOPY WITH PROPOFOL;  Surgeon: [name], MD;  Location: [facility];  Service: Endoscopy;  Laterality: N/A;  in cecum at 0807; total withdrawal time 9 minutes   CORONARY ANGIOPLASTY WITH STENT PLACEMENT     2000, and 2004 has 3 stents   CORONARY ARTERY BYPASS GRAFT N/A 04/27/2018   Procedure: CORONARY ARTERY BYPASS GRAFTING (CABG) x Three , using left internal mammary artery and right leg greater saphenous vein;  Surgeon: [name], MD;  Location: [facility];  Service: Open Heart Surgery;  Laterality: N/A;   ESOPHAGOGASTRODUODENOSCOPY (EGD) WITH PROPOFOL N/A 03/06/2014   Procedure: ESOPHAGOGASTRODUODENOSCOPY (EGD) WITH PROPOFOL;  Surgeon: [name], MD;  Location: [facility];  Service: Endoscopy;  Laterality: N/A;   EYE SURGERY Right 2013   "lazy eye"   JOINT REPLACEMENT Left 2011   LEFT HEART CATH AND CORONARY ANGIOGRAPHY N/A 04/24/2018   Procedure: LEFT HEART CATH AND CORONARY ANGIOGRAPHY;  Surgeon: [name], MD;  Location: [facility];  Service: Cardiovascular;  Laterality: N/A;   LUMBAR FUSION  11/01/2007   MALONEY DILATION N/A 03/06/2014   Procedure: MALONEY DILATION 54 french;  Surgeon: [name], MD;  Location: [facility];  Service: Endoscopy;  Laterality: N/A;   NECK SURGERY     POSTERIOR CERVICAL FUSION/FORAMINOTOMY N/A 04/01/2022   Procedure: Post Cervical Lami/Multi level - C3-C4 - C4-C5 and fusion with lateral mass screws and posterolateral arthrodesis;  Surgeon: [name], MD;   Location: [facility];  Service: Neurosurgery;  Laterality: N/A;   TEE WITHOUT CARDIOVERSION N/A 04/27/2018   Procedure: TRANSESOPHAGEAL ECHOCARDIOGRAM (TEE);  Surgeon: [name], MD;  Location: [facility];  Service: Open Heart Surgery;  Laterality: N/A;   Patient Active Problem List   Diagnosis Date Noted   Myelopathy concurrent with and due to spinal stenosis of cervical region ([place]) 04/01/2022   Spasticity 12/10/2021   Foot drop, bilateral 12/10/2021   Wheelchair dependence 12/10/2021   Nerve pain 12/10/2021   Cervical myelopathy (HCC)    Hyponatremia    Neurogenic bladder    Pressure injury of skin 08/08/2021   Acute incomplete quadriplegia ([place]) 08/01/2021   Epidural hematoma 07/22/2021   Myelopathy ([place]) 07/19/2021   Callus 05/18/2021   Acute bacterial rhinosinusitis 10/05/2020   Cough in adult 10/05/2020   S/P CABG x 3 04/27/2018   Angina pectoris ([place]) 04/24/2018   Obesity    Chronic combined systolic and diastolic congestive heart failure ([place])    Peripheral edema 06/30/2017   Pedal edema 11/28/2016   Osteopenia 08/13/2015   Thrombocytopenia ([place]) 05/25/2015   Chronic ITP (idiopathic thrombocytopenia) ([place]) 05/25/2015   Hypothyroidism 08/29/2014   Diverticulitis of colon (without mention of hemorrhage)(562.11) 02/04/2014   Dysphagia, unspecified(787.20) 02/04/2014   Hyperlipidemia 11/18/2013   Dyspnea 01/02/2012   Palpitations 01/02/2012   Mixed hyperlipidemia 07/06/2011   Coronary artery disease involving native coronary artery of native heart with angina pectoris ([place])    TOTAL KNEE FOLLOW-UP 08/14/2008   Chronic back pain 07/23/2008   KNEE, ARTHRITIS, DEGEN./OSTEO 05/29/2008   JOINT EFFUSION, KNEE 04/30/2008   Pain in joint, lower leg 10/10/2007   Essential hypertension 10/09/2007    REFERRING DIAG: M54.12 (ICD-10-CM) - Radiculopathy, cervical region   THERAPY DIAG:  No diagnosis found.  Rationale for Evaluation and Treatment Rehabilitation  PERTINENT  HISTORY: Numerous spine surgery, quadriplegia, osteopenia, HTN, myelopathy   PRECAUTIONS: Cervical and Fall  SUBJECTIVE: 06/30/22:  Pt stated he neck and radicular symptoms down Lt shoulder, pain scale 2/10.  Reports main problem with balance, feels the core strengthening exercises will be helpful.  No reports of recent falls.  Has been walking around room without AD.    Eval subjective: Patient states he had to have another surgery due to herniated disc. His symptoms, walking have improved. Balance still remains off. Has been using rollator. He was using w/c before. This surgery went smoother than the other ones.   PAIN:  Are you having pain? Yes: NPRS scale: 3/10 Pain location: neck and radicular symptoms down to Lt elbow Pain description: burning, stinging Aggravating factors: movement Relieving factors: shower, warm water OBJECTIVE:    Observation:   forward head, rounded shoulders   PATIENT SURVEYS:  LEFS 28/80   COGNITION: Overall cognitive status: Within functional limits for tasks assessed     SENSATION: Grossly decreased bilateral LE   POSTURE: rounded shoulders, forward head, increased thoracic kyphosis, and increased cervical lordosis       CERVICAL ROM:  Active ROM A/PROM (deg) eval  Flexion 0% limited  Extension 50% limited  Right lateral flexion 90% limited  Left lateral flexion 90% limited  Right rotation 50% limited  Left rotation 50% limited   (Blank rows = not tested)   UPPER EXTREMITY ROM: grossly decreased flexion/abduction to below 90 degrees bilaterally   Active ROM Right eval Left eval  Shoulder flexion      Shoulder extension      Shoulder abduction      Shoulder adduction      Shoulder extension      Shoulder internal rotation      Shoulder external rotation occiput occiput  Elbow flexion      Elbow extension      Wrist flexion      Wrist extension      Wrist ulnar deviation      Wrist radial deviation      Wrist pronation       Wrist supination       (Blank rows = not tested)   UPPER EXTREMITY MMT: ljimited shoulder ROM bilateral    MMT Right eval Left eval  Shoulder flexion 3- 3+  Shoulder extension      Shoulder abduction 3- 3+  Shoulder adduction      Shoulder extension      Shoulder internal rotation      Shoulder external rotation      Middle trapezius      Lower trapezius      Elbow flexion 4- 4+  Elbow extension 4- 4+  Wrist flexion      Wrist extension      Wrist ulnar deviation      Wrist radial deviation      Wrist pronation      Wrist supination      Grip strength       (Blank rows = not tested)     LOWER EXTREMITY MMT:     MMT Right eval Left eval  Hip flexion 4- 4  Hip extension      Hip abduction      Hip adduction      Hip internal rotation      Hip external rotation      Knee flexion 4+ 4-  Knee extension 4+ 4-  Ankle dorsiflexion 5 3+  Ankle plantarflexion      Ankle inversion      Ankle eversion       (Blank rows = not tested)        FUNCTIONAL TESTS:  5 times sit to stand: 23.14 seconds 2 minute walk test: 350 feet with rollator ; gait: trunk flexed, impaired foot clearance with RLE Transfer STS: labored with UE use Stairs: step too pattern with heavy UE use utilizing RLE, 7 inch steps   TODAY'S TREATMENT:  06/30/22:   Standing: cervical retraction front of wall    Star gazer 2x 5 available UE range    STS no HHA    Squat 10x with walker infront     Sidestep 3RT     Tandem stance 2x 30"   GTB: row and shoulder extension 15x    Seated: Sitting on dynadisc paloff RTB 20x 2   06/27/22: Seated posture Shoulders up, back (Scapular retraction) and relax 10x 3" Chin tuck (cueing for mechanics) RTB rows 2x10 5" holds Isometric cervical rotation 5x5"   Sidebend   Extension  UE flexion/ abd for seated posture 10x Sitting on dynadisc paloff RTB 20x 2  10x STS no HHA Standing sidestep no HHA front of mat 3RT (SBA with therapist) Squat with HHA on  walker  Sidelying hip abd    06/24/22:   SEated posture education Rows 2x 10 5" holds Chin tuck (cueing for mechanics) Wback 10  Supine:  Chin tuck - HEP Bridge 10x- HEP  Standing: STS 10x no HHA (fatigue at 9th rep Sidestep 10x front of counter Wall push-ups 2x 5 Chin tuck front of wall tactile cueing 5x 5" Standing tall for 1 minute     06/21/22 Goal review LEFS (28/80) Chin tuck x10  Scap retraction 10 x 5" Cervical AROM rotation/ flexion and extension x10 each   Sit to stand x10 min use of UE (fatigues at 7th rep)   Clinic amb 2 RT with rollator   Standing Rows GTB x20 Shoulder extension GTB x20   Nu step (seat 9) lv 3 5 min    06/16/22 evaluation     PATIENT EDUCATION:  Education details: 06/24/22: Educated importance of seated and standing posture for pain control and to reduce risk of falls during gait.   Patient educated on exam findings, POC, scope of PT, HEP, and therapy expectations. Person educated: Patient Education method: Explanation, Demonstration, and Handouts Education comprehension: verbalized understanding, returned demonstration, verbal cues required, and tactile cues required     HOME EXERCISE PROGRAM: Access Code: [email or web address] URL: [email or web address] Date: 06/21/2022 Prepared by: Josue Hector  Exercises - Seated Passive Cervical Retraction  - 2 x daily - 7 x weekly - 1-2 sets - 10 reps - 5 second hold - Seated Scapular Retraction  - 2 x daily - 7 x weekly - 1-2 sets - 10 reps - 5 second hold - Seated Cervical Rotation AROM  - 2 x daily - 7 x weekly - 1-2 sets - 10 reps - Seated Cervical Extension AROM  - 2 x daily - 7 x weekly - 1-2 sets - 10 reps  Supine: chin tuck, bridge  06/28/22:    ASSESSMENT:   CLINICAL IMPRESSION: Pt presents with improved seated posture on dynamic surface during paloff exercise.  Added static balance with cueing to improve awareness of posture with CGA for safety.  Pt does present  with increased visible LE fatigue with seated rest breaks for recovery.  Verbal and tactile cueing to improve cervical retraction and reduce trap compensation.  No reports of increased pain through session.    OBJECTIVE IMPAIRMENTS Abnormal gait, decreased activity tolerance, decreased balance, decreased coordination, decreased endurance, decreased mobility, difficulty walking, decreased ROM, decreased strength, increased muscle spasms, impaired flexibility, impaired sensation, impaired tone, improper body mechanics, and pain.    ACTIVITY LIMITATIONS carrying, lifting, bending, standing, squatting, stairs, transfers, hygiene/grooming, locomotion level, and caring for others   PARTICIPATION LIMITATIONS: meal prep, cleaning, laundry, driving, shopping, community activity, and yard work   PERSONAL FACTORS Past/current experiences, Time since onset of injury/illness/exacerbation, and 3+ comorbidities: Numerous spine surgery, quadriplegia, osteopenia, HTN, myelopathy   are also affecting patient's functional outcome.    REHAB POTENTIAL: Good   CLINICAL DECISION MAKING: Evolving/moderate complexity   EVALUATION COMPLEXITY: Moderate     GOALS: Goals reviewed with patient? Yes   SHORT TERM GOALS: Target date: 07/07/2022   Patient will be independent with HEP in order to improve functional outcomes. Baseline:  Goal status: IN PROGRESS   2.  Patient will report at least 25% improvement in symptoms for improved quality of life. Baseline:  Goal status:IN PROGRESS     LONG TERM GOALS:  Target date:08/11/2022   Patient will report at least 75% improvement in symptoms for improved quality of life. Baseline:  Goal status: IN PROGRESS   2.  Patient will be able to complete 5x STS in under 15 seconds in order to reduce the risk of falls. Baseline: 23.14 seconds Goal status: IN PROGRESS   3.  Patient will be able to ambulate at least 450 feet in 2MWT in order to demonstrate improved gait speed for  community ambulation.  Baseline: 350 feet with rollator Goal status: IN PROGRESS   4.  Patient will demo at least 4/5 BLE strength to improve safety and stability for standing ADL and gait  Baseline: see MMT Goal status: IN PROGRESS   5.  Patient will be able to navigate stairs with reciprocal pattern in order to demonstrate improved LE strength. Baseline: see above Goal status: IN PROGRESS           PLAN: PT FREQUENCY: 2x/week   PT DURATION: 8 weeks   PLANNED INTERVENTIONS: Therapeutic exercises, Therapeutic activity, Neuromuscular re-education, Balance training, Gait training, Patient/Family education, Joint manipulation, Joint mobilization, Stair training, Orthotic/Fit training, DME instructions, Aquatic Therapy, Dry Needling, Electrical stimulation, Spinal manipulation, Spinal mobilization, Cryotherapy, Moist heat, Compression bandaging, scar mobilization, Splintting, Taping, Traction, Ultrasound, Ionotophoresis '4mg'$ /ml Dexamethasone, and Manual therapy     PLAN FOR NEXT SESSION: bilateral LE strength, balance and gait training, core and postural strengthening, progress as able.      Ihor Austin, LPTA/CLT; CBIS [phone]  4:43 PM, 06/30/22

## 2022-07-01 ENCOUNTER — Encounter (HOSPITAL_COMMUNITY): Payer: Medicare Other | Admitting: Physical Therapy

## 2022-07-06 ENCOUNTER — Encounter (HOSPITAL_COMMUNITY): Payer: Medicare Other | Admitting: Physical Therapy

## 2022-07-08 ENCOUNTER — Encounter (HOSPITAL_COMMUNITY): Payer: Self-pay | Admitting: Physical Therapy

## 2022-07-08 ENCOUNTER — Ambulatory Visit (HOSPITAL_COMMUNITY): Payer: Medicare Other | Attending: Student | Admitting: Physical Therapy

## 2022-07-08 DIAGNOSIS — M542 Cervicalgia: Secondary | ICD-10-CM | POA: Diagnosis not present

## 2022-07-08 DIAGNOSIS — R2689 Other abnormalities of gait and mobility: Secondary | ICD-10-CM | POA: Diagnosis not present

## 2022-07-08 DIAGNOSIS — M6281 Muscle weakness (generalized): Secondary | ICD-10-CM | POA: Diagnosis not present

## 2022-07-08 DIAGNOSIS — R262 Difficulty in walking, not elsewhere classified: Secondary | ICD-10-CM | POA: Insufficient documentation

## 2022-07-08 NOTE — Therapy (Signed)
OUTPATIENT PHYSICAL THERAPY TREATMENT NOTE   Patient Name: [name] MRN: [id number] DOB:04-20-1951, 71 y.o., male Today's Date: 07/08/2022  PCP: Sallee Lange MD REFERRING PROVIDER: Eleonore Chiquito, NP   END OF SESSION:     07/08/22 1428  PT Visits / Re-Eval  Visit Number 6  Number of Visits 16  Date for PT Re-Evaluation 08/11/22  Authorization  Authorization Type [employer] Medicare (no auth, no VL)  PT Time Calculation  PT Start Time 1425  PT Stop Time 1515  PT Time Calculation (min) 50 min  PT - End of Session  Activity Tolerance Patient tolerated treatment well;Patient limited by fatigue  Behavior During Therapy WFL for tasks assessed/performed    Past Medical History:  Diagnosis Date   Allergy    Carpal tunnel syndrome    Chronic back pain    Chronic combined systolic and diastolic congestive heart failure (HCC)    Chronic ITP (idiopathic thrombocytopenia) (HCC) 05/25/2015   Coronary artery disease    Coronary artery disease involving native coronary artery of native heart with angina pectoris ([place])    Degenerative disc disease    Epidural hematoma ([place])    C3-4 ACDF 07/19/21, presented 07/22/21 with BLE/UE weakness/epidural hematoma, s/p revision & hematoma evacuation   GERD (gastroesophageal reflux disease)    History of hiatal hernia    History of thrombocytopenia    Hypercholesteremia    Hypertension    Hypothyroid    Lumbar pain    Myocardial infarction ([place]) 2009   Obesity    Pneumonia    around age 32   S/P CABG x 3 04/27/2018   LIMA to LAD SVG to OM1 SVG to OM2   Shortness of breath dyspnea    Past Surgical History:  Procedure Laterality Date   ANTERIOR CERVICAL DECOMP/DISCECTOMY FUSION N/A 07/19/2021   Procedure: CERVICAL THREE-FOUR ANTERIOR CERVICAL DECOMPRESSION/DISCECTOMY FUSION;  Surgeon: [name], MD;  Location: [facility];  Service: Neurosurgery;  Laterality: N/A;   ANTERIOR CERVICAL DECOMP/DISCECTOMY FUSION N/A 07/22/2021   Procedure:  ANTERIOR CERVICAL DECOMPRESSION/DISCECTOMY REVISON FUSION  WITH EVACUATION OF EPIDURAL HEMATOMA;  Surgeon: [name], DO;  Location: [facility];  Service: Neurosurgery;  Laterality: N/A;   BACK SURGERY     5 lumbas disc with cervical and lumbar fusions   BIOPSY N/A 03/06/2014   Procedure: ESOPHAGEAL BIOPSIES;  Surgeon: [name], MD;  Location: [facility];  Service: Endoscopy;  Laterality: N/A;   COLONOSCOPY     COLONOSCOPY WITH PROPOFOL N/A 03/06/2014   Procedure: COLONOSCOPY WITH PROPOFOL;  Surgeon: [name], MD;  Location: [facility];  Service: Endoscopy;  Laterality: N/A;  in cecum at 0807; total withdrawal time 9 minutes   CORONARY ANGIOPLASTY WITH STENT PLACEMENT     2000, and 2004 has 3 stents   CORONARY ARTERY BYPASS GRAFT N/A 04/27/2018   Procedure: CORONARY ARTERY BYPASS GRAFTING (CABG) x Three , using left internal mammary artery and right leg greater saphenous vein;  Surgeon: [name], MD;  Location: [facility];  Service: Open Heart Surgery;  Laterality: N/A;   ESOPHAGOGASTRODUODENOSCOPY (EGD) WITH PROPOFOL N/A 03/06/2014   Procedure: ESOPHAGOGASTRODUODENOSCOPY (EGD) WITH PROPOFOL;  Surgeon: [name], MD;  Location: [facility];  Service: Endoscopy;  Laterality: N/A;   EYE SURGERY Right 2013   "lazy eye"   JOINT REPLACEMENT Left 2011   LEFT HEART CATH AND CORONARY ANGIOGRAPHY N/A 04/24/2018   Procedure: LEFT HEART CATH AND CORONARY ANGIOGRAPHY;  Surgeon: [name], MD;  Location: [facility];  Service: Cardiovascular;  Laterality: N/A;   LUMBAR FUSION  11/01/2007   MALONEY DILATION N/A 03/06/2014   Procedure: MALONEY DILATION 54 french;  Surgeon: [name], MD;  Location: [facility];  Service: Endoscopy;  Laterality: N/A;   NECK SURGERY     POSTERIOR CERVICAL FUSION/FORAMINOTOMY N/A 04/01/2022   Procedure: Post Cervical Lami/Multi level - C3-C4 - C4-C5 and fusion with lateral mass screws and posterolateral arthrodesis;  Surgeon: [name], MD;   Location: [facility];  Service: Neurosurgery;  Laterality: N/A;   TEE WITHOUT CARDIOVERSION N/A 04/27/2018   Procedure: TRANSESOPHAGEAL ECHOCARDIOGRAM (TEE);  Surgeon: [name], MD;  Location: [facility];  Service: Open Heart Surgery;  Laterality: N/A;   Patient Active Problem List   Diagnosis Date Noted   Myelopathy concurrent with and due to spinal stenosis of cervical region ([place]) 04/01/2022   Spasticity 12/10/2021   Foot drop, bilateral 12/10/2021   Wheelchair dependence 12/10/2021   Nerve pain 12/10/2021   Cervical myelopathy (HCC)    Hyponatremia    Neurogenic bladder    Pressure injury of skin 08/08/2021   Acute incomplete quadriplegia ([place]) 08/01/2021   Epidural hematoma 07/22/2021   Myelopathy ([place]) 07/19/2021   Callus 05/18/2021   Acute bacterial rhinosinusitis 10/05/2020   Cough in adult 10/05/2020   S/P CABG x 3 04/27/2018   Angina pectoris ([place]) 04/24/2018   Obesity    Chronic combined systolic and diastolic congestive heart failure ([place])    Peripheral edema 06/30/2017   Pedal edema 11/28/2016   Osteopenia 08/13/2015   Thrombocytopenia ([place]) 05/25/2015   Chronic ITP (idiopathic thrombocytopenia) ([place]) 05/25/2015   Hypothyroidism 08/29/2014   Diverticulitis of colon (without mention of hemorrhage)(562.11) 02/04/2014   Dysphagia, unspecified(787.20) 02/04/2014   Hyperlipidemia 11/18/2013   Dyspnea 01/02/2012   Palpitations 01/02/2012   Mixed hyperlipidemia 07/06/2011   Coronary artery disease involving native coronary artery of native heart with angina pectoris ([place])    TOTAL KNEE FOLLOW-UP 08/14/2008   Chronic back pain 07/23/2008   KNEE, ARTHRITIS, DEGEN./OSTEO 05/29/2008   JOINT EFFUSION, KNEE 04/30/2008   Pain in joint, lower leg 10/10/2007   Essential hypertension 10/09/2007    REFERRING DIAG: M54.12 (ICD-10-CM) - Radiculopathy, cervical region   THERAPY DIAG:  Cervicalgia  Difficulty in walking, not elsewhere classified  Muscle weakness  (generalized)  Other abnormalities of gait and mobility  Rationale for Evaluation and Treatment Rehabilitation  PERTINENT HISTORY: Numerous spine surgery, quadriplegia, osteopenia, HTN, myelopathy   PRECAUTIONS: Cervical and Fall  SUBJECTIVE:  Patient reports he has a follow-up appointment with surgeon [name] next Thursday 9/14. Reports he has been doing a little better since starting therapy and eager to continue with his progress.  Eval subjective: Patient states he had to have another surgery due to herniated disc. His symptoms, walking have improved. Balance still remains off. Has been using rollator. He was using w/c before. This surgery went smoother than the other ones.   PAIN:  Are you having pain? Yes: NPRS scale: 4/10 Pain location: neck and radicular symptoms down bil shoulder Pain description: burning, stinging Aggravating factors: movement Relieving factors: shower, warm water OBJECTIVE:    Observation:   forward head, rounded shoulders   PATIENT SURVEYS:  LEFS 28/80   COGNITION: Overall cognitive status: Within functional limits for tasks assessed     SENSATION: Grossly decreased bilateral LE   POSTURE: rounded shoulders, forward head, increased thoracic kyphosis, and increased cervical lordosis  CERVICAL ROM:    Active ROM A/PROM (deg) eval  Flexion 0% limited  Extension 50% limited  Right lateral flexion 90% limited  Left lateral flexion 90% limited  Right rotation 50% limited  Left rotation 50% limited   (Blank rows = not tested)   UPPER EXTREMITY ROM: grossly decreased flexion/abduction to below 90 degrees bilaterally   Active ROM Right eval Left eval  Shoulder flexion      Shoulder extension      Shoulder abduction      Shoulder adduction      Shoulder extension      Shoulder internal rotation      Shoulder external rotation occiput occiput  Elbow flexion      Elbow extension      Wrist flexion      Wrist extension      Wrist  ulnar deviation      Wrist radial deviation      Wrist pronation      Wrist supination       (Blank rows = not tested)   UPPER EXTREMITY MMT: ljimited shoulder ROM bilateral    MMT Right eval Left eval  Shoulder flexion 3- 3+  Shoulder extension      Shoulder abduction 3- 3+  Shoulder adduction      Shoulder extension      Shoulder internal rotation      Shoulder external rotation      Middle trapezius      Lower trapezius      Elbow flexion 4- 4+  Elbow extension 4- 4+  Wrist flexion      Wrist extension      Wrist ulnar deviation      Wrist radial deviation      Wrist pronation      Wrist supination      Grip strength       (Blank rows = not tested)     LOWER EXTREMITY MMT:     MMT Right eval Left eval  Hip flexion 4- 4  Hip extension      Hip abduction      Hip adduction      Hip internal rotation      Hip external rotation      Knee flexion 4+ 4-  Knee extension 4+ 4-  Ankle dorsiflexion 5 3+  Ankle plantarflexion      Ankle inversion      Ankle eversion       (Blank rows = not tested)        FUNCTIONAL TESTS:  5 times sit to stand: 23.14 seconds 2 minute walk test: 350 feet with rollator ; gait: trunk flexed, impaired foot clearance with RLE Transfer STS: labored with UE use Stairs: step too pattern with heavy UE use utilizing RLE, 7 inch steps   TODAY'S TREATMENT:  07/08/22 Standing: cervical retraction front of wall  Star gazer 2x 5 available UE range  STS no HHA 2x5  Squat 2x10 bil HHA   Sidestep 5RT light UE support on rail  3/4 tandem stance 2x 30" GTB: row and shoulder extension 2x10 ea  Standing on airex pad (CGA) hip circles cw and c/cw x10 ea Seated: Sitting on dynadisc paloff RTB 2x10 ea direction  06/30/22:   Standing: cervical retraction front of wall    Star gazer 2x 5 available UE range    STS no HHA    Squat 10x with walker infront     Sidestep 3RT  Tandem stance 2x 30"   GTB: row and shoulder extension  15x    Seated: Sitting on dynadisc paloff RTB 20x 2   06/27/22: Seated posture Shoulders up, back (Scapular retraction) and relax 10x 3" Chin tuck (cueing for mechanics) RTB rows 2x10 5" holds Isometric cervical rotation 5x5"   Sidebend   Extension  UE flexion/ abd for seated posture 10x Sitting on dynadisc paloff RTB 20x 2   10x STS no HHA Standing sidestep no HHA front of mat 3RT (SBA with therapist) Squat with HHA on walker  Sidelying hip abd     PATIENT EDUCATION:  Education details: 06/24/22: Educated importance of seated and standing posture for pain control and to reduce risk of falls during gait.   Patient educated on exam findings, POC, scope of PT, HEP, and therapy expectations. Person educated: Patient Education method: Explanation, Demonstration, and Handouts Education comprehension: verbalized understanding, returned demonstration, verbal cues required, and tactile cues required     HOME EXERCISE PROGRAM: Access Code: [email or web address] URL: [email or web address] Date: 06/21/2022 Prepared by: Josue Hector  Exercises - Seated Passive Cervical Retraction  - 2 x daily - 7 x weekly - 1-2 sets - 10 reps - 5 second hold - Seated Scapular Retraction  - 2 x daily - 7 x weekly - 1-2 sets - 10 reps - 5 second hold - Seated Cervical Rotation AROM  - 2 x daily - 7 x weekly - 1-2 sets - 10 reps - Seated Cervical Extension AROM  - 2 x daily - 7 x weekly - 1-2 sets - 10 reps  Supine: chin tuck, bridge    ASSESSMENT:   CLINICAL IMPRESSION: Tactile cues for feedback with balance challenges, could not hold tandem but tolerated 3/4 tandem with intermittent errors. Cues for posture throughout with therex. Fatigued quickly with standing on foam, tremulous (clonus?) but able to complete and balance appeared appropriately challenged. Denies increase in pain during exercises.  OBJECTIVE IMPAIRMENTS Abnormal gait, decreased activity tolerance, decreased balance,  decreased coordination, decreased endurance, decreased mobility, difficulty walking, decreased ROM, decreased strength, increased muscle spasms, impaired flexibility, impaired sensation, impaired tone, improper body mechanics, and pain.    ACTIVITY LIMITATIONS carrying, lifting, bending, standing, squatting, stairs, transfers, hygiene/grooming, locomotion level, and caring for others   PARTICIPATION LIMITATIONS: meal prep, cleaning, laundry, driving, shopping, community activity, and yard work   PERSONAL FACTORS Past/current experiences, Time since onset of injury/illness/exacerbation, and 3+ comorbidities: Numerous spine surgery, quadriplegia, osteopenia, HTN, myelopathy   are also affecting patient's functional outcome.    REHAB POTENTIAL: Good   CLINICAL DECISION MAKING: Evolving/moderate complexity   EVALUATION COMPLEXITY: Moderate     GOALS: Goals reviewed with patient? Yes   SHORT TERM GOALS: Target date: 07/07/2022   Patient will be independent with HEP in order to improve functional outcomes. Baseline:  Goal status: IN PROGRESS   2.  Patient will report at least 25% improvement in symptoms for improved quality of life. Baseline:  Goal status:IN PROGRESS     LONG TERM GOALS: Target date:08/11/2022   Patient will report at least 75% improvement in symptoms for improved quality of life. Baseline:  Goal status: IN PROGRESS   2.  Patient will be able to complete 5x STS in under 15 seconds in order to reduce the risk of falls. Baseline: 23.14 seconds Goal status: IN PROGRESS   3.  Patient will be able to ambulate at least 450 feet in 2MWT in order to demonstrate improved gait speed for community ambulation.  Baseline:  350 feet with rollator Goal status: IN PROGRESS   4.  Patient will demo at least 4/5 BLE strength to improve safety and stability for standing ADL and gait  Baseline: see MMT Goal status: IN PROGRESS   5.  Patient will be able to navigate stairs with  reciprocal pattern in order to demonstrate improved LE strength. Baseline: see above Goal status: IN PROGRESS           PLAN: PT FREQUENCY: 2x/week   PT DURATION: 8 weeks   PLANNED INTERVENTIONS: Therapeutic exercises, Therapeutic activity, Neuromuscular re-education, Balance training, Gait training, Patient/Family education, Joint manipulation, Joint mobilization, Stair training, Orthotic/Fit training, DME instructions, Aquatic Therapy, Dry Needling, Electrical stimulation, Spinal manipulation, Spinal mobilization, Cryotherapy, Moist heat, Compression bandaging, scar mobilization, Splintting, Taping, Traction, Ultrasound, Ionotophoresis '4mg'$ /ml Dexamethasone, and Manual therapy     PLAN FOR NEXT SESSION: bilateral LE strength, balance and gait training, core and postural strengthening, progress as able.  (Consider adding step-ups.)   Candie Mile, PT, DPT Physical Therapist [facility]   3:20 PM, 07/08/22

## 2022-07-12 ENCOUNTER — Ambulatory Visit (HOSPITAL_COMMUNITY): Payer: Medicare Other | Admitting: Physical Therapy

## 2022-07-14 ENCOUNTER — Ambulatory Visit (HOSPITAL_COMMUNITY): Payer: Medicare Other | Admitting: Physical Therapy

## 2022-07-15 ENCOUNTER — Other Ambulatory Visit: Payer: Self-pay | Admitting: Physical Medicine and Rehabilitation

## 2022-07-15 DIAGNOSIS — R252 Cramp and spasm: Secondary | ICD-10-CM

## 2022-07-19 ENCOUNTER — Encounter (HOSPITAL_COMMUNITY): Payer: Medicare Other | Admitting: Physical Therapy

## 2022-07-21 ENCOUNTER — Ambulatory Visit (HOSPITAL_COMMUNITY): Payer: Medicare Other | Admitting: Physical Therapy

## 2022-07-26 ENCOUNTER — Encounter (HOSPITAL_COMMUNITY): Payer: Medicare Other | Admitting: Physical Therapy

## 2022-07-26 DIAGNOSIS — M5412 Radiculopathy, cervical region: Secondary | ICD-10-CM | POA: Diagnosis not present

## 2022-07-28 ENCOUNTER — Encounter (HOSPITAL_COMMUNITY): Payer: Medicare Other | Admitting: Physical Therapy

## 2022-08-02 ENCOUNTER — Encounter (HOSPITAL_COMMUNITY): Payer: Medicare Other | Admitting: Physical Therapy

## 2022-08-03 ENCOUNTER — Other Ambulatory Visit: Payer: Self-pay | Admitting: Family Medicine

## 2022-08-04 ENCOUNTER — Encounter (HOSPITAL_COMMUNITY): Payer: Medicare Other | Admitting: Physical Therapy

## 2022-08-08 ENCOUNTER — Encounter (HOSPITAL_COMMUNITY): Payer: Medicare Other | Admitting: Physical Therapy

## 2022-08-09 ENCOUNTER — Encounter: Payer: Self-pay | Admitting: Family Medicine

## 2022-08-09 ENCOUNTER — Ambulatory Visit (INDEPENDENT_AMBULATORY_CARE_PROVIDER_SITE_OTHER): Payer: Medicare Other | Admitting: Family Medicine

## 2022-08-09 VITALS — BP 134/76 | Wt 251.0 lb

## 2022-08-09 DIAGNOSIS — J019 Acute sinusitis, unspecified: Secondary | ICD-10-CM | POA: Diagnosis not present

## 2022-08-09 DIAGNOSIS — R0989 Other specified symptoms and signs involving the circulatory and respiratory systems: Secondary | ICD-10-CM | POA: Diagnosis not present

## 2022-08-09 DIAGNOSIS — Z23 Encounter for immunization: Secondary | ICD-10-CM

## 2022-08-09 DIAGNOSIS — R200 Anesthesia of skin: Secondary | ICD-10-CM

## 2022-08-09 DIAGNOSIS — E038 Other specified hypothyroidism: Secondary | ICD-10-CM

## 2022-08-09 DIAGNOSIS — E7849 Other hyperlipidemia: Secondary | ICD-10-CM

## 2022-08-09 DIAGNOSIS — J029 Acute pharyngitis, unspecified: Secondary | ICD-10-CM | POA: Diagnosis not present

## 2022-08-09 DIAGNOSIS — D696 Thrombocytopenia, unspecified: Secondary | ICD-10-CM

## 2022-08-09 DIAGNOSIS — I5042 Chronic combined systolic (congestive) and diastolic (congestive) heart failure: Secondary | ICD-10-CM

## 2022-08-09 DIAGNOSIS — I10 Essential (primary) hypertension: Secondary | ICD-10-CM

## 2022-08-09 MED ORDER — DOXYCYCLINE HYCLATE 100 MG PO TABS: 100.0000 mg | ORAL_TABLET | Freq: Two times a day (BID) | ORAL | 0 refills | Status: DC

## 2022-08-09 MED ORDER — LEVOTHYROXINE SODIUM 150 MCG PO TABS: 150.0000 ug | ORAL_TABLET | Freq: Every day | ORAL | 1 refills | Status: DC

## 2022-08-09 NOTE — Progress Notes (Signed)
   Subjective:    Patient ID: [name], male    DOB: 1950/12/20, 71 y.o.   MRN: [id number]  HPI Pt arrives for follow up. Pt states blood pressure was elevated at surgeon last week. Pt states that he has put on weight due to multiple surgeries.  Need for vaccination - Plan: Flu Vaccine QUAD High Dose(Fluad)  Thrombocytopenia ([place]), Chronic - Plan: levothyroxine (SYNTHROID) 150 MCG tablet  Hand numbness - Plan: levothyroxine (SYNTHROID) 150 MCG tablet  Chronic combined systolic and diastolic congestive heart failure (HCC), Chronic - Plan: levothyroxine (SYNTHROID) 150 MCG tablet  Essential hypertension - Plan: levothyroxine (SYNTHROID) 150 MCG tablet  Other specified hypothyroidism - Plan: levothyroxine (SYNTHROID) 150 MCG tablet  Coronary artery disease of native artery of native heart with stable angina pectoris ([place]), Chronic - Plan: levothyroxine (SYNTHROID) 150 MCG tablet  Other hyperlipidemia - Plan: levothyroxine (SYNTHROID) 150 MCG tablet  Encounter for subsequent annual wellness visit (AWV) in Medicare patient - Plan: levothyroxine (SYNTHROID) 150 MCG tablet  Runny nose - Plan: Novel Coronavirus, NAA ([facility])  Sore throat - Plan: Novel Coronavirus, NAA ([facility])  Pt having runny nose and sore throat for about 4 days.  Patient denies any wheezing difficulty breathing does relate some mild sinus congestion pressure and tightness  Review of Systems     Objective:   Physical Exam  General-in no acute distress Eyes-no discharge Lungs-respiratory rate normal, CTA CV-no murmurs,RRR Extremities skin warm dry no edema Neuro grossly normal Behavior normal, alert Mild sinus tenderness no wheezing no shortness of breath on today's exam no pneumonia      Assessment & Plan:  1. Thrombocytopenia ([place]) He has underlying history of thrombocytopenia.  No sign of it getting to his severe measure currently - levothyroxine (SYNTHROID) 150 MCG tablet; Take 1 tablet (150 mcg  total) by mouth daily.  Dispense: 90 tablet; Refill: 1  2. Hand numbness Has history of hand numbness his strength is fair he has cervical myelopathy issues previous surgery - levothyroxine (SYNTHROID) 150 MCG tablet; Take 1 tablet (150 mcg total) by mouth daily.  Dispense: 90 tablet; Refill: 1  3. Chronic combined systolic and diastolic congestive heart failure ([place]) Heart related issues takes his medication regular basis - levothyroxine (SYNTHROID) 150 MCG tablet; Take 1 tablet (150 mcg total) by mouth daily.  Dispense: 90 tablet; Refill: 1  4. Essential hypertension Blood pressure decent control continue current measures - levothyroxine (SYNTHROID) 150 MCG tablet; Take 1 tablet (150 mcg total) by mouth daily.  Dispense: 90 tablet; Refill: 1  5. Acute rhinosinusitis Antibiotic prescribed follow-up if problems  6. Other specified hypothyroidism Recent lab work looked good continue Synthroid - levothyroxine (SYNTHROID) 150 MCG tablet; Take 1 tablet (150 mcg total) by mouth daily.  Dispense: 90 tablet; Refill: 1  7. Coronary artery disease of native artery of native heart with stable angina pectoris (HCC) Heart disease stable - levothyroxine (SYNTHROID) 150 MCG tablet; Take 1 tablet (150 mcg total) by mouth daily.  Dispense: 90 tablet; Refill: 1  8. Other hyperlipidemia History hyperlipidemia continue medication - levothyroxine (SYNTHROID) 150 MCG tablet; Take 1 tablet (150 mcg total) by mouth daily.  Dispense: 90 tablet; Refill: 1 10. Need for vaccination Flu shot today - Flu Vaccine QUAD High Dose(Fluad)  11. Runny nose COVID test - Novel Coronavirus, NAA ([facility])  12. Sore throat COVID test - Novel Coronavirus, NAA ([facility])

## 2022-08-10 ENCOUNTER — Encounter (HOSPITAL_COMMUNITY): Payer: Medicare Other | Admitting: Physical Therapy

## 2022-08-10 LAB — SPECIMEN STATUS REPORT

## 2022-08-10 LAB — NOVEL CORONAVIRUS, NAA: SARS-CoV-2, NAA: NOT DETECTED

## 2022-08-11 ENCOUNTER — Other Ambulatory Visit: Payer: Self-pay | Admitting: Family Medicine

## 2022-08-21 NOTE — Progress Notes (Signed)
Cardiology Office Note    Date:  08/25/2022   ID:  [name], DOB 11-27-1950, MRN [id number]  PCP:  Kathyrn Drown, MD  Cardiologist: Jenkins Rouge, MD    No chief complaint on file.    History of Present Illness:    TARRELL Burns is a 71 y.o. 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  Had his cervical decompression first part of June with some improvement   Spends time in [place] where he has a time share Has a 3 legged Beagle at home   No chest pain Able to walk with walker now    Past Medical History:  Diagnosis Date   Allergy    Carpal tunnel syndrome    Chronic back pain    Chronic combined systolic and diastolic congestive heart failure (HCC)    Chronic ITP (idiopathic thrombocytopenia) ([place]) 05/25/2015   Coronary artery disease    Coronary artery disease involving native coronary artery of native heart with angina pectoris ([place])    Degenerative disc disease    Epidural hematoma ([place])    C3-4 ACDF 07/19/21, presented 07/22/21 with BLE/UE weakness/epidural hematoma, s/p revision & hematoma evacuation   GERD (gastroesophageal reflux disease)    History of hiatal hernia    History of thrombocytopenia    Hypercholesteremia    Hypertension    Hypothyroid    Lumbar pain    Myocardial infarction ([place]) 2009   Obesity    Pneumonia    around age 13   S/P CABG x 3 04/27/2018   LIMA to LAD SVG to OM1 SVG to OM2   Shortness of breath dyspnea     Past Surgical History:  Procedure Laterality Date   ANTERIOR CERVICAL DECOMP/DISCECTOMY FUSION N/A 07/19/2021   Procedure: CERVICAL THREE-FOUR ANTERIOR CERVICAL DECOMPRESSION/DISCECTOMY FUSION;  Surgeon: [name], MD;  Location: [facility];  Service: Neurosurgery;  Laterality: N/A;   ANTERIOR CERVICAL DECOMP/DISCECTOMY FUSION N/A 07/22/2021   Procedure: ANTERIOR CERVICAL DECOMPRESSION/DISCECTOMY REVISON FUSION  WITH EVACUATION OF EPIDURAL HEMATOMA;  Surgeon: [name], DO;  Location: [facility];  Service: Neurosurgery;  Laterality: N/A;   BACK SURGERY     5 lumbas disc with cervical and lumbar fusions   BIOPSY N/A 03/06/2014   Procedure: ESOPHAGEAL BIOPSIES;  Surgeon: [name], MD;  Location: [facility];  Service: Endoscopy;  Laterality: N/A;   COLONOSCOPY     COLONOSCOPY WITH PROPOFOL N/A 03/06/2014   Procedure: COLONOSCOPY WITH PROPOFOL;  Surgeon: [name], MD;  Location: [facility];  Service: Endoscopy;  Laterality: N/A;  in cecum at 0807; total withdrawal time 9 minutes   CORONARY ANGIOPLASTY WITH STENT PLACEMENT     2000, and 2004 has 3 stents   CORONARY ARTERY BYPASS GRAFT N/A 04/27/2018   Procedure: CORONARY ARTERY BYPASS GRAFTING (CABG) x Three , using left internal mammary artery and right leg greater saphenous vein;  Surgeon: [name], MD;  Location: [facility];  Service: Open Heart Surgery;  Laterality: N/A;   ESOPHAGOGASTRODUODENOSCOPY (EGD) WITH PROPOFOL N/A 03/06/2014   Procedure: ESOPHAGOGASTRODUODENOSCOPY (EGD) WITH PROPOFOL;  Surgeon: [name], MD;  [facility];  Service: Endoscopy;  Laterality: N/A;  EYE SURGERY Right 2013   "lazy eye"   JOINT REPLACEMENT Left 2011   LEFT HEART CATH AND CORONARY ANGIOGRAPHY N/A 04/24/2018   Procedure: LEFT HEART CATH AND CORONARY ANGIOGRAPHY;  Surgeon: [name], MD;  Location: [facility];  Service: Cardiovascular;  Laterality: N/A;   LUMBAR FUSION  11/01/2007   MALONEY DILATION N/A 03/06/2014   Procedure: MALONEY DILATION 54 french;  Surgeon: [name], MD;  [facility];  Service: Endoscopy;  Laterality: N/A;   NECK SURGERY     POSTERIOR CERVICAL FUSION/FORAMINOTOMY N/A 04/01/2022   Procedure: Post  Cervical Lami/Multi level - C3-C4 - C4-C5 and fusion with lateral mass screws and posterolateral arthrodesis;  Surgeon: [name], MD;  Location: [facility];  Service: Neurosurgery;  Laterality: N/A;   TEE WITHOUT CARDIOVERSION N/A 04/27/2018   Procedure: TRANSESOPHAGEAL ECHOCARDIOGRAM (TEE);  Surgeon: [name], MD;  Location: [facility];  Service: Open Heart Surgery;  Laterality: N/A;    Current Medications: Outpatient Medications Prior to Visit  Medication Sig Dispense Refill   acetaminophen (TYLENOL) 325 MG tablet Take 2 tablets (650 mg total) by mouth every 6 (six) hours as needed for mild pain (or Fever >/= 101). (Patient taking differently: Take 1,000 mg by mouth every 6 (six) hours as needed for mild pain (or Fever >/= 101).)     albuterol (VENTOLIN HFA) 108 (90 Base) MCG/ACT inhaler Inhale 2 puffs into the lungs every 6 (six) hours as needed for wheezing or shortness of breath. 1 each 0   alendronate (FOSAMAX) 70 MG tablet Take 1 tablet (70 mg total) by mouth every 7 (seven) days. Take with a full glass of water on an empty stomach. 4 tablet 11   baclofen (LIORESAL) 10 MG tablet Take 1 tablet (10 mg total) by mouth 3 (three) times daily. X 1 weeks- then can increase to 10 mg 3x/day- for spasticity (Patient taking differently: Take 10 mg by mouth 2 (two) times daily. X 1 weeks- then can increase to 10 mg 3x/day- for spasticity) 90 tablet 5   cholecalciferol (VITAMIN D3) 25 MCG (1000 UNIT) tablet Take 1,000 Units by mouth daily.     ezetimibe (ZETIA) 10 MG tablet Take 1 tablet (10 mg total) by mouth daily. 90 tablet 0   gabapentin (NEURONTIN) 600 MG tablet Take 1 tablet (600 mg total) by mouth 3 (three) times daily. 270 tablet 1   levothyroxine (SYNTHROID) 150 MCG tablet Take 1 tablet (150 mcg total) by mouth daily. 90 tablet 1   lisinopril (ZESTRIL) 20 MG tablet Take 1 tablet (20 mg total) by mouth daily. 90 tablet 3   metoprolol tartrate (LOPRESSOR) 50 MG tablet TAKE 1 TABLET(50 MG) BY MOUTH  TWICE DAILY 180 tablet 1   nitroGLYCERIN (NITROSTAT) 0.4 MG SL tablet PLACE 1 TABLET UNDER THE TONGUE EVERY 5 MINUTES AS NEEDED FOR CHEST PAINS. (Patient taking differently: Place 0.4 mg under the tongue every 5 (five) minutes as needed for chest pain.) 25 tablet 3   pantoprazole (PROTONIX) 40 MG tablet Take 1 tablet (40 mg total) by mouth daily. 90 tablet 1   polycarbophil (FIBERCON) 625 MG tablet Take 1 tablet (625 mg total) by mouth daily. 30 tablet 0   potassium chloride (KLOR-CON M) 10 MEQ tablet TAKE 1 TABLET(10 MEQ) BY MOUTH DAILY 90 tablet 0   rosuvastatin (CRESTOR) 40 MG tablet TAKE 1 TABLET(40 MG) BY MOUTH EVERY EVENING 90 tablet 1   sertraline (ZOLOFT) 100 MG tablet TAKE ONE TABLET BY  MOUTH ONCE A DAY 90 tablet 3   tamsulosin (FLOMAX) 0.4 MG CAPS capsule TAKE 2 CAPSULES BY MOUTH AT NIGHT 180 capsule 0   torsemide (DEMADEX) 20 MG tablet TAKE 2 TABLETS(40 MG) BY MOUTH DAILY (Patient taking differently: as needed.) 180 tablet 1   Vitamin D, Ergocalciferol, (DRISDOL) 1.25 MG (50000 UNIT) CAPS capsule TAKE 1 CAPSULE BY MOUTH 1 TIME EVERY WEEK 4 capsule 1   doxycycline (VIBRA-TABS) 100 MG tablet Take 1 tablet (100 mg total) by mouth 2 (two) times daily. (Patient not taking: Reported on 08/25/2022) 14 tablet 0   enoxaparin (LOVENOX) 60 MG/0.6ML injection Lovenox 50 mg daily through 11/20/2021 and stop 14.5 mL 2   Multiple Vitamin (MULTIVITAMIN) capsule Take 1 capsule by mouth daily.   (Patient not taking: Reported on 08/25/2022)     oxyCODONE-acetaminophen (PERCOCET) 7.5-325 MG tablet Take 1 tablet by mouth every 4 (four) hours as needed for severe pain. (Patient not taking: Reported on 08/25/2022) 20 tablet 0   No facility-administered medications prior to visit.     Allergies:   Penicillins, Sulfa antibiotics, and Zoloft [sertraline hcl]   Social History   Socioeconomic History   Marital status: Married    Spouse name: Not on file   Number of children: Not on file   Years of education:  Not on file   Highest education level: Not on file  Occupational History   Occupation: retireed  Tobacco Use   Smoking status: Former    Packs/day: 2.00    Years: 20.00    Total pack years: 40.00    Types: Cigarettes    Quit date: 07/06/1991    Years since quitting: 31.1   Smokeless tobacco: Never  Vaping Use   Vaping Use: Never used  Substance and Sexual Activity   Alcohol use: Yes    Comment: occassional   Drug use: No   Sexual activity: Not Currently    Birth control/protection: None  Other Topics Concern   Not on file  Social History Narrative   Disabled due to chronic back pain   Social Determinants of Health   Financial Resource Strain: Low Risk  (06/28/2022)   Overall Financial Resource Strain (CARDIA)    Difficulty of Paying Living Expenses: Not hard at all  Food Insecurity: No Food Insecurity (06/28/2022)   Hunger Vital Sign    Worried About Running Out of Food in the Last Year: Never true    R[place]ie in the Last Year: Never true  Transportation Needs: No Transportation Needs (06/28/2022)   PRAPARE - Hydrologist (Medical): No    Lack of Transportation (Non-Medical): No  Physical Activity: Insufficiently Active (06/28/2022)   Exercise Vital Sign    Days of Exercise per Week: 2 days    Minutes of Exercise per Session: 60 min  Stress: No Stress Concern Present (06/28/2022)   [place]    Feeling of Stress : Not at all  Social Connections: Moderately Integrated (06/28/2022)   Social Connection and Isolation Panel [NHANES]    Frequency of Communication with Friends and Family: More than three times a week    Frequency of Social Gatherings with Friends and Family: More than three times a week    Attends Religious Services: More than 4 times per year    Active Member of [employer] or Organizations: No    Attends Archivist Meetings: Never    Marital Status:  Married  Family History:  The patient's family history includes Heart attack in his father.   Review of Systems:   Please see the history of present illness.     General:  No chills, fever, night sweats or weight changes. Positive for mouth pain.  Cardiovascular:  No chest pain, dyspnea on exertion, edema, orthopnea, palpitations, paroxysmal nocturnal dyspnea. Dermatological: No rash, lesions/masses Respiratory: No cough, dyspnea Urologic: No hematuria, dysuria Abdominal:   No nausea, vomiting, diarrhea, bright red blood per rectum, melena, or hematemesis Neurologic:  No visual changes, wkns, changes in mental status.   All other systems reviewed and are otherwise negative except as noted above.   Physical Exam:    VS:  BP 116/60   Pulse 67   Ht 6' (1.829 m)   Wt 252 lb 3.2 oz (114.4 kg)   SpO2 94%   BMI 34.20 kg/m    Affect appropriate Healthy:  appears stated age 76: normal Neck supple with no adenopathy JVP normal no bruits no thyromegaly Lungs clear with no wheezing and good diaphragmatic motion Heart:  S1/S2 no murmur, no rub, gallop or click PMI normal post sternotomy  Abdomen: benighn, BS positve, no tenderness, no AAA no bruit.  No HSM or HJR Distal pulses intact with no bruits No edema Neuro Able to lift legs now and walk with walker  Skin warm and dry No muscular weakness   Wt Readings from Last 3 Encounters:  08/25/22 252 lb 3.2 oz (114.4 kg)  08/09/22 251 lb (113.9 kg)  06/28/22 247 lb (112 kg)     Studies/Labs Reviewed:   EKG:  EKG is ordered today.  The ekg ordered today demonstrates NSR, HR 60 with frequent PAC's. TWI along inferior leads. 08/25/2022 SR rate 54 isolated PVC IVCD PR 220 msec 08/25/2022 SR rate 66 PVCls   Recent Labs: 09/20/2021: ALT 6 02/08/2022: TSH 0.082 03/23/2022: BUN 11; Creatinine, Ser 1.12; Hemoglobin 12.6; Platelets 108; Potassium 4.7; Sodium 141   Lipid Panel    Component Value Date/Time   CHOL 223 (H)  02/08/2022 1148   TRIG 134 02/08/2022 1148   HDL 38 (L) 02/08/2022 1148   CHOLHDL 5.9 (H) 02/08/2022 1148   CHOLHDL 5.7 04/25/2018 0358   VLDL 25 04/25/2018 0358   LDLCALC 161 (H) 02/08/2022 1148    Additional studies/ records that were reviewed today include:   Cardiac Catheterization: 03/2018 Prox RCA lesion is 95% stenosed. Dist RCA-2 lesion is 100% stenosed. Dist RCA-1 lesion is 99% stenosed. Ost 1st Mrg lesion is 100% stenosed. Lat 1st Mrg lesion is 100% stenosed. Ost LAD to Prox LAD lesion is 50% stenosed. Mid LAD-1 lesion is 75% stenosed. This is seen best in teh LAO caudal view. Mid LAD-2 lesion is 50% stenosed. There is mild left ventricular systolic dysfunction. The left ventricular ejection fraction is 45-50% by visual estimate. LV end diastolic pressure is normal. There is no aortic valve stenosis.   Severe three vessel disease including occlusion of both prior stented segments.  De novo LAD lesion, beset seen in the LAD caudal view, and clearly worse from prior cath.   Cardiac surgery consult.     Will admit since he reports pain at rest.  Will hold off on heparin for now given prior platelet issues.  May need to start if pain occurs.   Echocardiogram: 03/2018 Study Conclusions   - Left ventricle: The cavity size was normal. Systolic function was   mildly reduced. The estimated ejection fraction was in the range  of 45% to 50%. Hypokinesis of the inferolateral, anterior and   anteroseptal, and anterolateral myocardium. Doppler parameters   are consistent with abnormal left ventricular relaxation (grade 1   diastolic dysfunction). Doppler parameters are consistent with   high ventricular filling pressure. - Aortic valve: Transvalvular velocity was within the normal range.   There was no stenosis. There was mild regurgitation directed   towards the mitral anterior leaflet. Regurgitation pressure   half-time: 887 ms. - Aorta: Aortic root dimension: 39 mm  (ED). - Ascending aorta: The ascending aorta was mildly dilated. - Mitral valve: Transvalvular velocity was within the normal range.   There was no evidence for stenosis. There was trivial   regurgitation. - Left atrium: The atrium was severely dilated. - Right ventricle: The cavity size was normal. Wall thickness was   normal. Systolic function was normal. - Tricuspid valve: There was trivial regurgitation. - Pulmonary arteries: Systolic pressure was within the normal   range.    Plan:   In order of problems listed above:  1. CAD - s/p prior stenting of LCx and RCA in 2003 with stenting of OM in 2012. Underwent CABG on 04/27/2018 with LIMA-LAD, SVG-OM1, and SVG-OM2.  - continue ASA '81mg'$  daily, BB, and statin therapy. -  Ok to hold ASA for any surgery   2. Post-Operative Atrial Fibrillation - Occurred following CABG but no known recurrence since. Not on anticoagulation continue beta blocker   3. HTN - Increase lisinopriil to 20 mg and continue lopressor   4. HLD - LDL 95 on 07/26/19 discussed diet continue high dose crestor   5. Thyroid - continue synthroid TSH normal 07/26/19   6. ITP - last PLT count 130 4.11.23 no bleeding issues f/u hematology   7. Neuro:  post cervical decompression C3-5 improved f/u Dr Marianna Payment: None today  Procedures/Testing: None today  Follow-Up: 1 year     Signed, Jenkins Rouge, MD  08/25/2022 2:13 PM    [facility]. [address] [place], [place] [address] Phone: [phone] Fax: [phone]

## 2022-08-22 ENCOUNTER — Other Ambulatory Visit: Payer: Self-pay | Admitting: Family Medicine

## 2022-08-25 ENCOUNTER — Encounter: Payer: Self-pay | Admitting: Cardiovascular Disease

## 2022-08-25 ENCOUNTER — Ambulatory Visit: Payer: Medicare Other | Attending: Student | Admitting: Cardiovascular Disease

## 2022-08-25 VITALS — BP 116/60 | HR 67 | Ht 72.0 in | Wt 252.2 lb

## 2022-08-25 DIAGNOSIS — I2510 Atherosclerotic heart disease of native coronary artery without angina pectoris: Secondary | ICD-10-CM | POA: Diagnosis not present

## 2022-08-25 DIAGNOSIS — E782 Mixed hyperlipidemia: Secondary | ICD-10-CM | POA: Insufficient documentation

## 2022-08-25 DIAGNOSIS — I480 Paroxysmal atrial fibrillation: Secondary | ICD-10-CM | POA: Diagnosis not present

## 2022-08-25 MED ORDER — NITROGLYCERIN 0.4 MG SL SUBL: SUBLINGUAL_TABLET | SUBLINGUAL | 3 refills | Status: AC

## 2022-08-25 NOTE — Patient Instructions (Addendum)
Medication Instructions:  Your physician recommends that you continue on your current medications as directed. Please refer to the Current Medication list given to you today.  *If you need a refill on your cardiac medications before your next appointment, please call your pharmacy*  Lab Work: If you have labs (blood work) drawn today and your tests are completely normal, you will receive your results only by: [place] (if you have MyChart) OR A paper copy in the mail If you have any lab test that is abnormal or we need to change your treatment, we will call you to review the results.  Testing/Procedures: None ordered today.  Follow-Up: At [facility], you and your health needs are our priority.  As part of our continuing mission to provide you with exceptional heart care, we have created designated Provider Care Teams.  These Care Teams include your primary Cardiologist (physician) and Advanced Practice Providers (APPs -  Physician Assistants and Nurse Practitioners) who all work together to provide you with the care you need, when you need it.  We recommend signing up for the patient portal called "MyChart".  Sign up information is provided on this After Visit Summary.  MyChart is used to connect with patients for Virtual Visits (Telemedicine).  Patients are able to view lab/test results, encounter notes, upcoming appointments, etc.  Non-urgent messages can be sent to your provider as well.   To learn more about what you can do with MyChart, go to [email or web address].    Your next appointment:   12 month(s)  The format for your next appointment:   In Person  Provider:   Jenkins Rouge, MD      Important Information About Sugar

## 2022-08-26 ENCOUNTER — Other Ambulatory Visit: Payer: Self-pay | Admitting: Family Medicine

## 2022-09-02 ENCOUNTER — Other Ambulatory Visit: Payer: Self-pay | Admitting: *Deleted

## 2022-09-02 DIAGNOSIS — I10 Essential (primary) hypertension: Secondary | ICD-10-CM

## 2022-09-02 DIAGNOSIS — D696 Thrombocytopenia, unspecified: Secondary | ICD-10-CM

## 2022-09-02 DIAGNOSIS — I25118 Atherosclerotic heart disease of native coronary artery with other forms of angina pectoris: Secondary | ICD-10-CM

## 2022-09-02 DIAGNOSIS — Z0000 Encounter for general adult medical examination without abnormal findings: Secondary | ICD-10-CM

## 2022-09-02 DIAGNOSIS — E038 Other specified hypothyroidism: Secondary | ICD-10-CM

## 2022-09-02 DIAGNOSIS — I5042 Chronic combined systolic (congestive) and diastolic (congestive) heart failure: Secondary | ICD-10-CM

## 2022-09-02 DIAGNOSIS — R200 Anesthesia of skin: Secondary | ICD-10-CM

## 2022-09-02 DIAGNOSIS — E7849 Other hyperlipidemia: Secondary | ICD-10-CM

## 2022-09-02 MED ORDER — METOPROLOL TARTRATE 50 MG PO TABS: ORAL_TABLET | ORAL | 0 refills | Status: DC

## 2022-10-05 ENCOUNTER — Other Ambulatory Visit: Payer: Self-pay | Admitting: Family Medicine

## 2022-10-05 DIAGNOSIS — N319 Neuromuscular dysfunction of bladder, unspecified: Secondary | ICD-10-CM

## 2022-10-21 ENCOUNTER — Other Ambulatory Visit: Payer: Self-pay | Admitting: *Deleted

## 2022-10-21 MED ORDER — PANTOPRAZOLE SODIUM 40 MG PO TBEC: 40.0000 mg | DELAYED_RELEASE_TABLET | Freq: Every day | ORAL | 0 refills | Status: DC

## 2022-11-01 ENCOUNTER — Other Ambulatory Visit: Payer: Self-pay | Admitting: Family Medicine

## 2022-11-12 ENCOUNTER — Other Ambulatory Visit: Payer: Self-pay | Admitting: Family Medicine

## 2022-11-12 DIAGNOSIS — N319 Neuromuscular dysfunction of bladder, unspecified: Secondary | ICD-10-CM

## 2022-11-26 ENCOUNTER — Other Ambulatory Visit: Payer: Self-pay | Admitting: Family Medicine

## 2022-12-01 ENCOUNTER — Other Ambulatory Visit: Payer: Self-pay | Admitting: Family Medicine

## 2022-12-01 DIAGNOSIS — R200 Anesthesia of skin: Secondary | ICD-10-CM

## 2022-12-01 DIAGNOSIS — Z0000 Encounter for general adult medical examination without abnormal findings: Secondary | ICD-10-CM

## 2022-12-01 DIAGNOSIS — I25118 Atherosclerotic heart disease of native coronary artery with other forms of angina pectoris: Secondary | ICD-10-CM

## 2022-12-01 DIAGNOSIS — D696 Thrombocytopenia, unspecified: Secondary | ICD-10-CM

## 2022-12-01 DIAGNOSIS — I10 Essential (primary) hypertension: Secondary | ICD-10-CM

## 2022-12-01 DIAGNOSIS — E038 Other specified hypothyroidism: Secondary | ICD-10-CM

## 2022-12-01 DIAGNOSIS — I5042 Chronic combined systolic (congestive) and diastolic (congestive) heart failure: Secondary | ICD-10-CM

## 2022-12-01 DIAGNOSIS — E7849 Other hyperlipidemia: Secondary | ICD-10-CM

## 2022-12-18 IMAGING — MR MR CERVICAL SPINE W/O CM
5 series · 37 of 48 positions shown · non-contrast
Comparison: MRI 07/22/2021, CT 07/22/2021

CLINICAL DATA: Neck pain, history of cervical spine surgery

EXAM:
MRI CERVICAL SPINE WITHOUT CONTRAST
TECHNIQUE: Multiplanar, multisequence MR imaging of the cervical spine was
performed. No intravenous contrast was administered.

[Series 5: T2 · sagittal · 3.0mm · 0.69mm/px · 6 of 15 slices shown (1 of 2)]
[im 1/15]
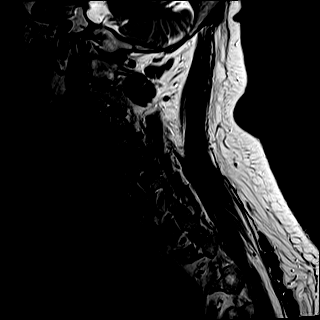
[im 3/15]
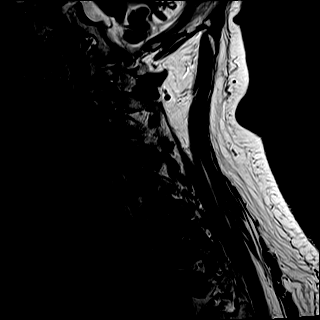
[im 6/15]
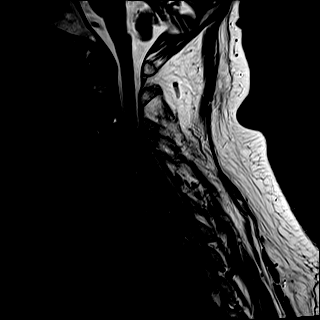
[im 9/15]
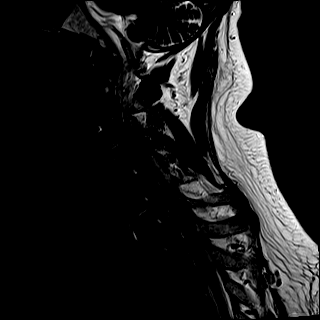
[im 12/15]
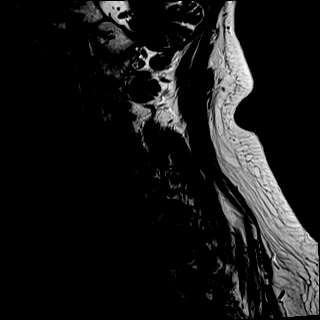
[im 15/15]
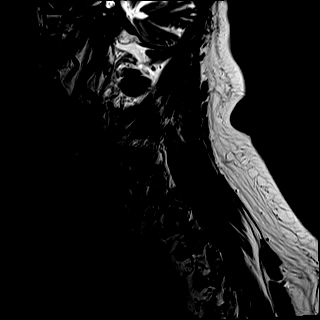

[Series 6: T1 · sagittal · 3.0mm · 0.86mm/px · 6 of 15 slices shown]
[im 1/15]
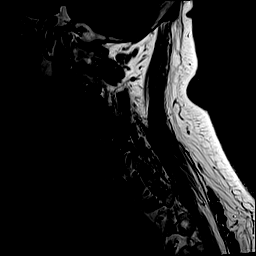
[im 3/15]
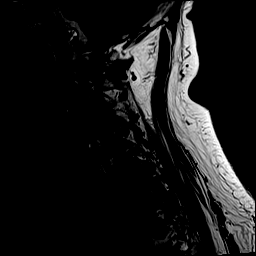
[im 6/15]
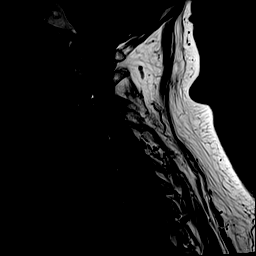
[im 9/15]
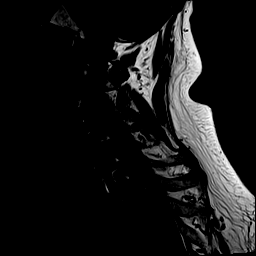
[im 12/15]
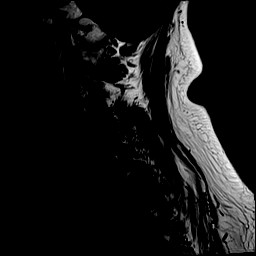
[im 15/15]
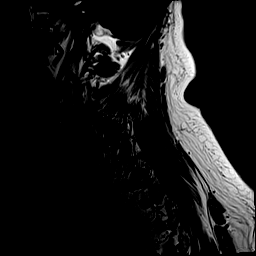

[Series 7: STIR · sagittal · 3.0mm · 0.69mm/px · 6 of 15 slices shown]
[im 1/15]
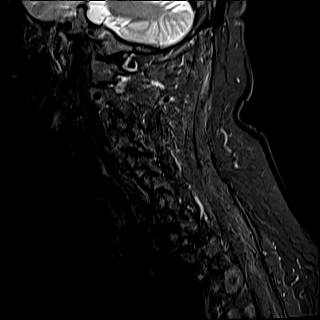
[im 3/15]
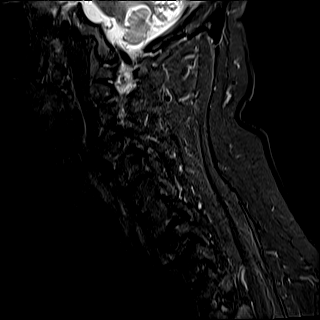
[im 6/15]
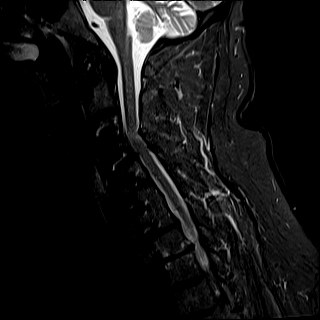
[im 9/15]
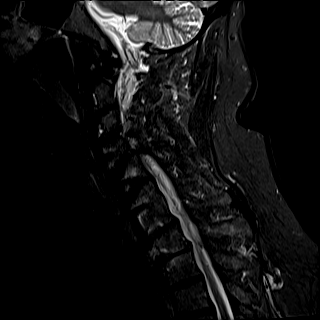
[im 12/15]
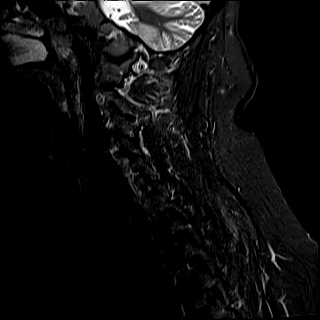
[im 15/15]
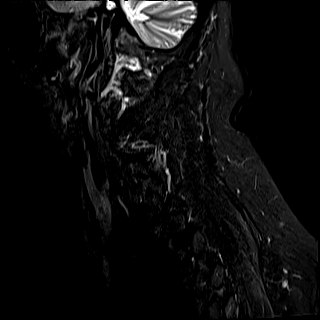

[Series 8: T2 · axial · 3.0mm · 0.70mm/px · z∈[-90,+29]mm · 11 of 38 slices shown (2 of 2)]
[im 1/38]
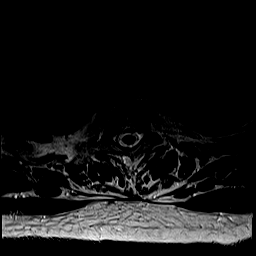
[im 3/38]
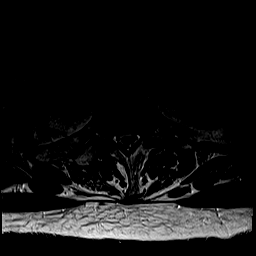
[im 6/38]
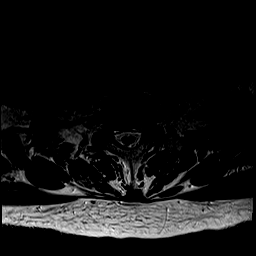
[im 8/38]
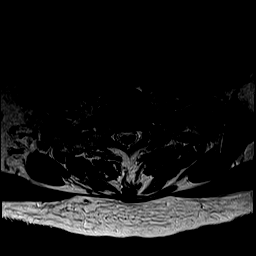
[im 11/38]
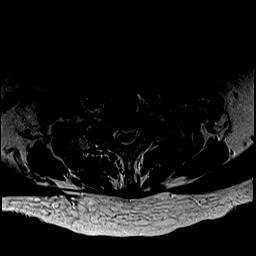
[im 16/38]
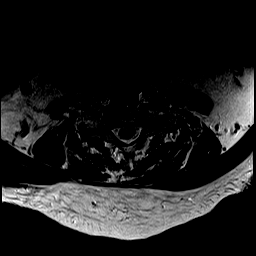
[im 19/38]
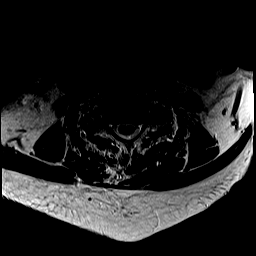
[im 22/38]
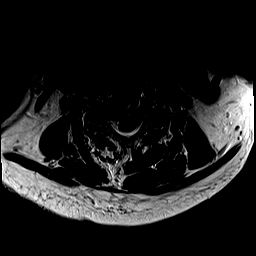
[im 27/38]
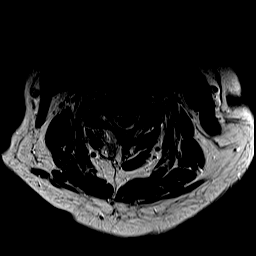
[im 32/38]
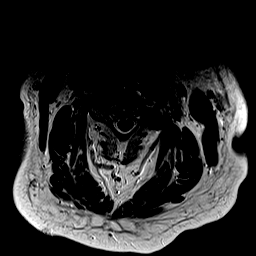
[im 38/38]
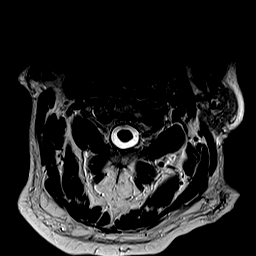

[Series 9: GRE · axial · 3.0mm · 0.35mm/px · z∈[-90,+29]mm · 8 of 38 slices shown]
[im 1/38]
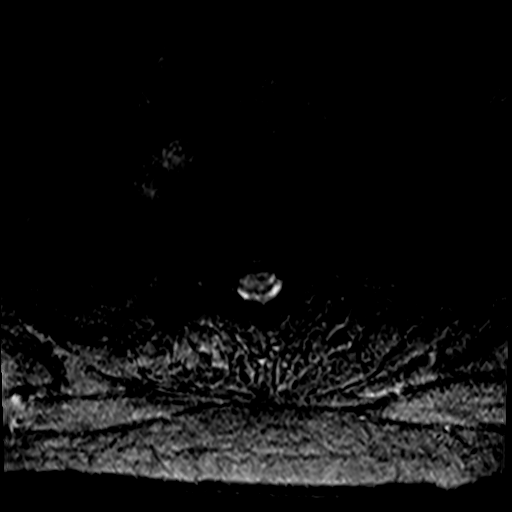
[im 6/38]
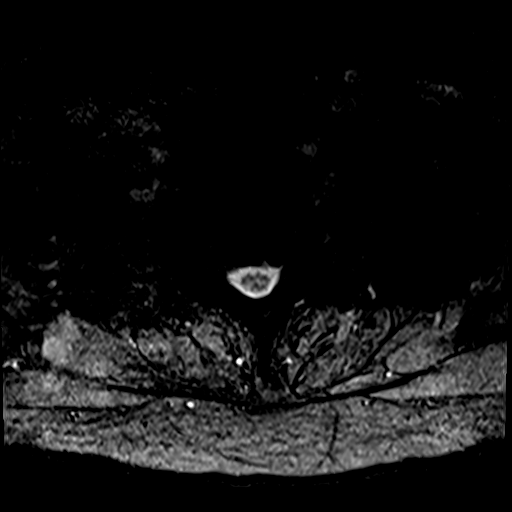
[im 11/38]
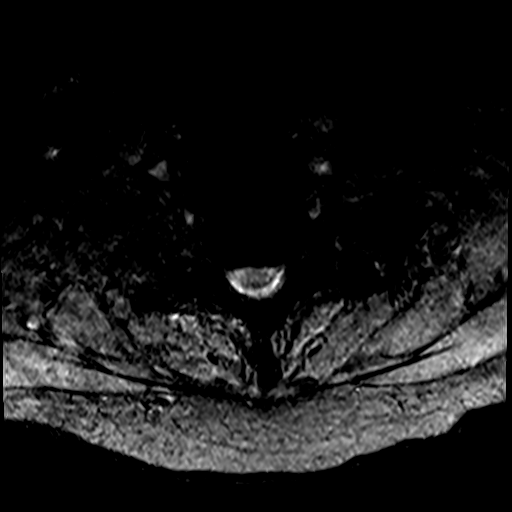
[im 16/38]
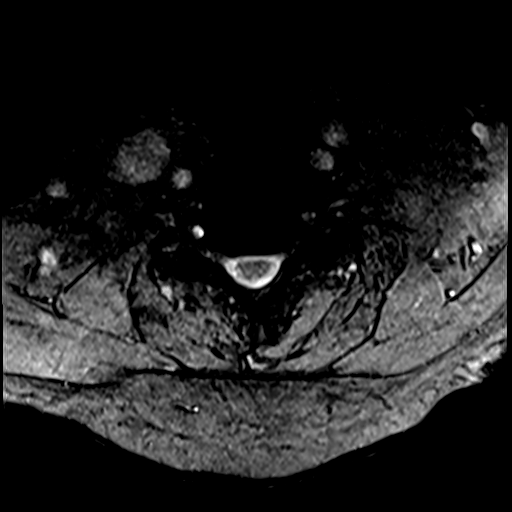
[im 22/38]
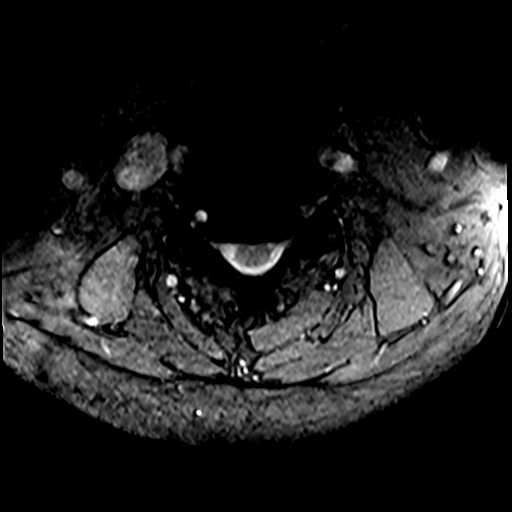
[im 27/38]
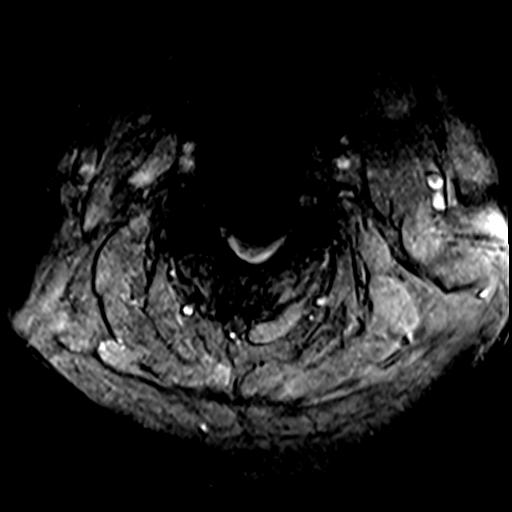
[im 32/38]
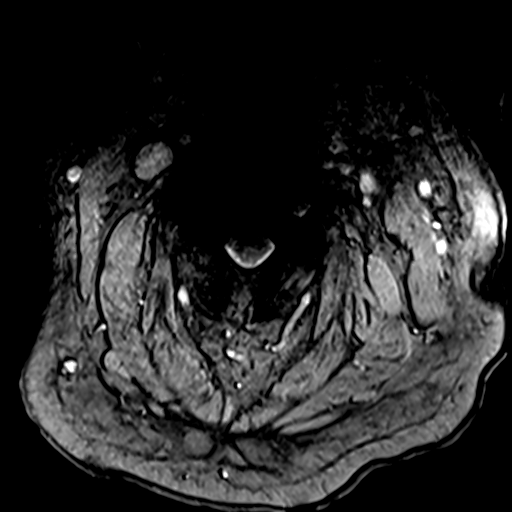
[im 38/38]
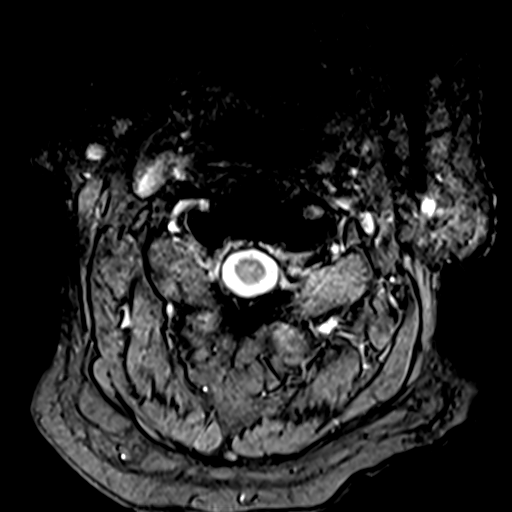

[37 of 48 positions shown; findings below may reference images not displayed]

FINDINGS: Alignment: Focal lordosis at C3-C4. Trace residual anterolisthesis
at C4-C5.

Vertebrae: Postsurgical changes of ACDF at C3-C4 and C5-C7. There is
solid bony fusion at C6-C7 but not at C5-C6. Questionable
arthrodesis at C3-C4.

Cord: There is focal kinking of the cord at C3-C4 with mild
associated cord signal abnormality.

Posterior Fossa, vertebral arteries, paraspinal tissues: Diminutive
left vertebral artery with dominant right vertebral artery. This is
similar appearance to prior MRI in July 2021.

Disc levels:

C2-C3: Unchanged endplate and facet spurring. Mild ligamentum flavum
thickening. No significant spinal canal stenosis. Unchanged moderate
left moderate neural foraminal stenosis.

C3-C4: Postsurgical changes of ACDF with revision and evacuation of
a epidural collection. There is focal kinking of the cord at this
level with mild cortical no abnormality. Improved patency of the
spinal canal with persistent moderate spinal canal stenosis at this
level. Similar degree of bilateral neural foraminal stenosis.

C4-C5: Trace residual anterolisthesis with dense plate spurring and
facet arthropathy and ligamentum flavum thickening. There is mild
spinal canal narrowing. Unchanged moderate to severe bilateral
neural foraminal stenosis.

Stable postoperative appearance at C5-C6 and C6-C7.

C7-T1: Broad-based disc bulging and bilateral facet arthropathy
results in moderate bilateral neural foraminal stenosis. Minimal
spinal canal narrowing.

Broad-based disc bulging and facet arthropathy results in moderate
bilateral neural foraminal stenosis and mild spinal canal stenosis
at T1-T2.
IMPRESSION: Interval C3-C4 ACDF revision and evacuation of an epidural
collection. Improved spinal canal patency with persistent moderate
spinal canal stenosis, focal kinking of the cord with mild abnormal
cord signal at this level suggestive of congestive myelopathy.
Similar degree of bilateral neural foraminal stenosis at this level.

Unchanged mild spinal canal narrowing and moderate to severe
bilateral neural foraminal stenosis at C4-C5.

Stable postoperative appearance from C5-C6 and C6-C7

Unchanged moderate left neural foraminal stenosis at C2-C3.

## 2023-01-12 ENCOUNTER — Other Ambulatory Visit: Payer: Self-pay | Admitting: Family Medicine

## 2023-01-16 ENCOUNTER — Other Ambulatory Visit: Payer: Self-pay | Admitting: Family Medicine

## 2023-01-18 ENCOUNTER — Telehealth: Payer: Self-pay

## 2023-01-18 DIAGNOSIS — N319 Neuromuscular dysfunction of bladder, unspecified: Secondary | ICD-10-CM

## 2023-01-18 MED ORDER — EZETIMIBE 10 MG PO TABS: ORAL_TABLET | ORAL | 0 refills | Status: DC

## 2023-01-18 NOTE — Telephone Encounter (Signed)
Prescription Request  01/18/2023  LOV: Visit date not found  What is the name of the medication or equipment? ezetimibe (ZETIA) 10 MG tablet   Have you contacted your pharmacy to request a refill? Yes   Which pharmacy would you like this sent to?  [id number] - [place], [address] [place] [address] Phone: [phone] Fax: [phone]    Patient notified that their request is being sent to the clinical staff for review and that they should receive a response within 2 business days.   Please advise at Mobile [phone] (mobile)

## 2023-01-18 NOTE — Telephone Encounter (Signed)
Nurses-please call patient-please verify that he has been taking sertraline without having any side effects and also verify that he does want refills.  Also patient needs to do follow-up office visits every 6 months

## 2023-01-27 ENCOUNTER — Other Ambulatory Visit: Payer: Self-pay | Admitting: Family Medicine

## 2023-01-30 ENCOUNTER — Other Ambulatory Visit: Payer: Self-pay | Admitting: *Deleted

## 2023-01-30 DIAGNOSIS — R252 Cramp and spasm: Secondary | ICD-10-CM

## 2023-01-31 NOTE — Telephone Encounter (Signed)
Patient may have 1 refill of muscle relaxer with 1 additional refill He may have 90-day on Zoloft He needs to schedule an office visit.  Please send him a MyChart message or have the front call him thank you

## 2023-02-01 ENCOUNTER — Other Ambulatory Visit: Payer: Self-pay

## 2023-02-01 ENCOUNTER — Encounter: Payer: Self-pay | Admitting: Family Medicine

## 2023-02-01 MED ORDER — SERTRALINE HCL 100 MG PO TABS: ORAL_TABLET | ORAL | 3 refills | Status: DC

## 2023-02-06 ENCOUNTER — Encounter: Payer: Self-pay | Admitting: Family Medicine

## 2023-02-06 MED ORDER — BACLOFEN 10 MG PO TABS: 10.0000 mg | ORAL_TABLET | Freq: Three times a day (TID) | ORAL | 0 refills | Status: DC

## 2023-02-07 ENCOUNTER — Telehealth: Payer: Self-pay

## 2023-02-07 DIAGNOSIS — R252 Cramp and spasm: Secondary | ICD-10-CM

## 2023-02-07 MED ORDER — GABAPENTIN 600 MG PO TABS: 600.0000 mg | ORAL_TABLET | Freq: Three times a day (TID) | ORAL | 0 refills | Status: DC

## 2023-02-07 NOTE — Telephone Encounter (Signed)
Prescription Request  02/07/2023  LOV: Visit date not found  What is the name of the medication or equipment? gabapentin (NEURONTIN) 600 MG tablet   Have you contacted your pharmacy to request a refill? Yes   Which pharmacy would you like this sent to?  [id number] - [place], [place] - [address] [place] [address] Phone: [phone] Fax: [phone]    Patient notified that their request is being sent to the clinical staff for review and that they should receive a response within 2 business days.   Please advise at Mobile [phone] (mobile)

## 2023-02-15 ENCOUNTER — Other Ambulatory Visit: Payer: Self-pay | Admitting: Family Medicine

## 2023-02-20 ENCOUNTER — Other Ambulatory Visit: Payer: Self-pay | Admitting: Family Medicine

## 2023-02-20 DIAGNOSIS — E038 Other specified hypothyroidism: Secondary | ICD-10-CM

## 2023-03-02 ENCOUNTER — Ambulatory Visit (INDEPENDENT_AMBULATORY_CARE_PROVIDER_SITE_OTHER): Payer: Medicare Other | Admitting: Family Medicine

## 2023-03-02 VITALS — BP 180/111 | HR 73 | Wt 287.2 lb

## 2023-03-02 DIAGNOSIS — R739 Hyperglycemia, unspecified: Secondary | ICD-10-CM | POA: Diagnosis not present

## 2023-03-02 DIAGNOSIS — D693 Immune thrombocytopenic purpura: Secondary | ICD-10-CM

## 2023-03-02 DIAGNOSIS — R252 Cramp and spasm: Secondary | ICD-10-CM | POA: Diagnosis not present

## 2023-03-02 DIAGNOSIS — D696 Thrombocytopenia, unspecified: Secondary | ICD-10-CM | POA: Diagnosis not present

## 2023-03-02 DIAGNOSIS — E7849 Other hyperlipidemia: Secondary | ICD-10-CM | POA: Diagnosis not present

## 2023-03-02 DIAGNOSIS — R600 Localized edema: Secondary | ICD-10-CM | POA: Diagnosis not present

## 2023-03-02 DIAGNOSIS — I25118 Atherosclerotic heart disease of native coronary artery with other forms of angina pectoris: Secondary | ICD-10-CM

## 2023-03-02 DIAGNOSIS — I5042 Chronic combined systolic (congestive) and diastolic (congestive) heart failure: Secondary | ICD-10-CM | POA: Diagnosis not present

## 2023-03-02 DIAGNOSIS — E038 Other specified hypothyroidism: Secondary | ICD-10-CM | POA: Diagnosis not present

## 2023-03-02 MED ORDER — AZELASTINE HCL 0.1 % NA SOLN: 2.0000 | Freq: Two times a day (BID) | NASAL | 12 refills | Status: DC

## 2023-03-02 MED ORDER — GABAPENTIN 300 MG PO CAPS: ORAL_CAPSULE | ORAL | 1 refills | Status: DC

## 2023-03-02 MED ORDER — LISINOPRIL 20 MG PO TABS: 20.0000 mg | ORAL_TABLET | Freq: Every day | ORAL | 3 refills | Status: DC

## 2023-03-02 MED ORDER — LEVOTHYROXINE SODIUM 150 MCG PO TABS: ORAL_TABLET | ORAL | 1 refills | Status: DC

## 2023-03-02 MED ORDER — TORSEMIDE 20 MG PO TABS: ORAL_TABLET | ORAL | 1 refills | Status: DC

## 2023-03-02 NOTE — Progress Notes (Signed)
Subjective:    Patient ID: [name], male    DOB: [birth year], 72 y.o.   MRN: [id number]  HPI Patient arrives today for medication med check.   Weight gain   patient would like to discuss getting a scooter.   Patient would like to discuss his allergies.   Outpatient Encounter Medications as of 03/02/2023  Medication Sig   acetaminophen (TYLENOL) 325 MG tablet Take 2 tablets (650 mg total) by mouth every 6 (six) hours as needed for mild pain (or Fever >/= 101). (Patient taking differently: Take 1,000 mg by mouth every 6 (six) hours as needed for mild pain (or Fever >/= 101).)   alendronate (FOSAMAX) 70 MG tablet Take 1 tablet (70 mg total) by mouth every 7 (seven) days. Take with a full glass of water on an empty stomach.   azelastine (ASTELIN) 0.1 % nasal spray Place 2 sprays into both nostrils 2 (two) times daily.   baclofen (LIORESAL) 10 MG tablet Take 1 tablet (10 mg total) by mouth 3 (three) times daily. X 1 weeks- then can increase to 10 mg 3x/day- for spasticity   cholecalciferol (VITAMIN D3) 25 MCG (1000 UNIT) tablet Take 1,000 Units by mouth daily.   gabapentin (NEURONTIN) 300 MG capsule 1 am 1 mid day and 2 in the evening   metoprolol tartrate (LOPRESSOR) 50 MG tablet TAKE 1 TABLET(50 MG) BY MOUTH TWICE DAILY   Multiple Vitamin (MULTIVITAMIN) capsule Take 1 capsule by mouth daily.   nitroGLYCERIN (NITROSTAT) 0.4 MG SL tablet PLACE 1 TABLET UNDER THE TONGUE EVERY 5 MINUTES AS NEEDED FOR CHEST PAINS. Strength: 0.4 mg   pantoprazole (PROTONIX) 40 MG tablet TAKE 1 TABLET(40 MG) BY MOUTH DAILY   polycarbophil (FIBERCON) 625 MG tablet Take 1 tablet (625 mg total) by mouth daily.   potassium chloride (KLOR-CON M) 10 MEQ tablet TAKE 1 TABLET(10 MEQ) BY MOUTH DAILY   rosuvastatin (CRESTOR) 40 MG tablet TAKE 1 TABLET(40 MG) BY MOUTH EVERY EVENING   sertraline (ZOLOFT) 100 MG tablet TAKE ONE TABLET BY MOUTH ONCE A DAY   tamsulosin (FLOMAX) 0.4 MG CAPS capsule TAKE 2 CAPSULES BY MOUTH  AT NIGHT   [DISCONTINUED] gabapentin (NEURONTIN) 600 MG tablet Take 1 tablet (600 mg total) by mouth 3 (three) times daily.   [DISCONTINUED] levothyroxine (SYNTHROID) 150 MCG tablet TAKE 1 TABLET(150 MCG) BY MOUTH DAILY   [DISCONTINUED] lisinopril (ZESTRIL) 20 MG tablet Take 1 tablet (20 mg total) by mouth daily.   [DISCONTINUED] torsemide (DEMADEX) 20 MG tablet TAKE 2 TABLETS(40 MG) BY MOUTH DAILY (Patient taking differently: as needed.)   albuterol (VENTOLIN HFA) 108 (90 Base) MCG/ACT inhaler Inhale 2 puffs into the lungs every 6 (six) hours as needed for wheezing or shortness of breath.   ezetimibe (ZETIA) 10 MG tablet TAKE 1 TABLET(10 MG) BY MOUTH DAILY   levothyroxine (SYNTHROID) 150 MCG tablet TAKE 1 TABLET(150 MCG) BY MOUTH DAILY   lisinopril (ZESTRIL) 20 MG tablet Take 1 tablet (20 mg total) by mouth daily.   torsemide (DEMADEX) 20 MG tablet 1 to 2 each morning as needed   [DISCONTINUED] doxycycline (VIBRA-TABS) 100 MG tablet Take 1 tablet (100 mg total) by mouth 2 (two) times daily.   [DISCONTINUED] enoxaparin (LOVENOX) 60 MG/0.6ML injection Lovenox 50 mg daily through 11/20/2021 and stop   [DISCONTINUED] oxyCODONE-acetaminophen (PERCOCET) 7.5-325 MG tablet Take 1 tablet by mouth every 4 (four) hours as needed for severe pain. (Patient not taking: Reported on 08/25/2022)   [DISCONTINUED] Vitamin D, Ergocalciferol, (DRISDOL)  1.25 MG (50000 UNIT) CAPS capsule TAKE 1 CAPSULE BY MOUTH 1 TIME EVERY WEEK   No facility-administered encounter medications on file as of 03/02/2023.   Pedal edema - Plan: Magnesium  Other specified hypothyroidism - Plan: levothyroxine (SYNTHROID) 150 MCG tablet, TSH  Spasticity  Thrombocytopenia (HCC) - Plan: CBC with Differential  Coronary artery disease of native artery of native heart with stable angina pectoris (HCC) - Plan: Hepatic Function Panel  Other hyperlipidemia - Plan: Lipid panel  Hyperglycemia - Plan: Hemoglobin A1c, Basic Metabolic  Panel  Chronic ITP (idiopathic thrombocytopenia) (HCC), Chronic  Chronic combined systolic and diastolic congestive heart failure (HCC), Chronic     Review of Systems     Objective:   Physical Exam  General-in no acute distress Eyes-no discharge Lungs-respiratory rate normal, CTA CV-no murmurs,RRR Extremities skin warm dry mild edema Neuro grossly normal Behavior normal, alert       Assessment & Plan:  1. Other specified hypothyroidism Check lab work continue current medication - levothyroxine (SYNTHROID) 150 MCG tablet; TAKE 1 TABLET(150 MCG) BY MOUTH DAILY  Dispense: 90 tablet; Refill: 1 - TSH  2. Spasticity Patient does stretching for this.  Also cautious use of muscle relaxer caution drowsiness  3. Chronic ITP (idiopathic thrombocytopenia) (HCC) Periodically check CBC no petechiae today  4. Chronic combined systolic and diastolic congestive heart failure (HCC) Patient does get out of breath with activity but it is a very difficult time for him to walk with a walker he can transfer from room to room with a walker but he cannot walk any significant distances because of the weakness in his legs from previous spinal injury from surgery  Family very interested in getting a scooter because it is a great impact this has on his quality of life they feel if he would have a scooter if he could use it in the house to help him with ADLs plus also it would allow him to get out and do things with the family  I certainly support the idea of the scooter but do not know if his insurance will cover it I have encouraged family to reach out to their insurance and then get back to [place]  5. Pedal edema As per gabapentin will reduce the dose hopefully this will cut down on some of the swelling he is currently using fluid pills intermittently I have encouraged him to use 1 every single day - Magnesium  6. Thrombocytopenia (HCC) We will check a CBC to see what the platelet count was  doing - CBC with Differential  7. Coronary artery disease of native artery of native heart with stable angina pectoris (HCC) Heart function they are currently he gets way out of breath with activity this is a reflection of his heart issue and deconditioning I do not find any active CHF currently on exam - Hepatic Function Panel  8. Other hyperlipidemia Lipid recommended already on statin medication - Lipid panel  9. Hyperglycemia Check A1c he has had significant weight gain we talked about healthy choices and portion control - Hemoglobin A1c - Basic Metabolic Panel  I have encouraged family to send [place] some blood pressure readings within the next 3 to 4 weeks we will reach out to them to schedule follow-up visit in 3 months

## 2023-03-03 DIAGNOSIS — D696 Thrombocytopenia, unspecified: Secondary | ICD-10-CM | POA: Diagnosis not present

## 2023-03-03 DIAGNOSIS — I25118 Atherosclerotic heart disease of native coronary artery with other forms of angina pectoris: Secondary | ICD-10-CM | POA: Diagnosis not present

## 2023-03-03 DIAGNOSIS — E7849 Other hyperlipidemia: Secondary | ICD-10-CM | POA: Diagnosis not present

## 2023-03-03 DIAGNOSIS — R739 Hyperglycemia, unspecified: Secondary | ICD-10-CM | POA: Diagnosis not present

## 2023-03-03 DIAGNOSIS — R600 Localized edema: Secondary | ICD-10-CM | POA: Diagnosis not present

## 2023-03-04 LAB — CBC WITH DIFFERENTIAL/PLATELET
Basophils Absolute: 0.1 10*3/uL (ref 0.0–0.2)
Basos: 1 %
EOS (ABSOLUTE): 0.1 10*3/uL (ref 0.0–0.4)
Eos: 2 %
Hematocrit: 39.1 % (ref 37.5–51.0)
Hemoglobin: 12.9 g/dL — ABNORMAL LOW (ref 13.0–17.7)
Immature Grans (Abs): 0 10*3/uL (ref 0.0–0.1)
Immature Granulocytes: 0 %
Lymphocytes Absolute: 0.8 10*3/uL (ref 0.7–3.1)
Lymphs: 16 %
MCH: 29 pg (ref 26.6–33.0)
MCHC: 33 g/dL (ref 31.5–35.7)
MCV: 88 fL (ref 79–97)
Monocytes Absolute: 0.3 10*3/uL (ref 0.1–0.9)
Monocytes: 6 %
Neutrophils Absolute: 4 10*3/uL (ref 1.4–7.0)
Neutrophils: 75 %
Platelets: 131 10*3/uL — ABNORMAL LOW (ref 150–450)
RBC: 4.45 x10E6/uL (ref 4.14–5.80)
RDW: 14.6 % (ref 11.6–15.4)
WBC: 5.4 10*3/uL (ref 3.4–10.8)

## 2023-03-04 LAB — TSH: TSH: 0.078 u[IU]/mL — ABNORMAL LOW (ref 0.450–4.500)

## 2023-03-04 LAB — BASIC METABOLIC PANEL
BUN/Creatinine Ratio: 13 (ref 10–24)
BUN: 13 mg/dL (ref 8–27)
CO2: 27 mmol/L (ref 20–29)
Calcium: 9.4 mg/dL (ref 8.6–10.2)
Chloride: 101 mmol/L (ref 96–106)
Creatinine, Ser: 1.03 mg/dL (ref 0.76–1.27)
Glucose: 89 mg/dL (ref 70–99)
Potassium: 4.8 mmol/L (ref 3.5–5.2)
Sodium: 142 mmol/L (ref 134–144)
eGFR: 77 mL/min/{1.73_m2} (ref >59)

## 2023-03-04 LAB — HEPATIC FUNCTION PANEL
ALT: 15 IU/L (ref 0–44)
AST: 16 IU/L (ref 0–40)
Albumin: 4.6 g/dL (ref 3.8–4.8)
Alkaline Phosphatase: 85 IU/L (ref 44–121)
Bilirubin Total: 0.6 mg/dL (ref 0.0–1.2)
Bilirubin, Direct: 0.17 mg/dL (ref 0.00–0.40)
Total Protein: 7 g/dL (ref 6.0–8.5)

## 2023-03-04 LAB — LIPID PANEL
Chol/HDL Ratio: 3.1 ratio (ref 0.0–5.0)
Cholesterol, Total: 169 mg/dL (ref 100–199)
HDL: 55 mg/dL (ref >39)
LDL Chol Calc (NIH): 97 mg/dL (ref 0–99)
Triglycerides: 92 mg/dL (ref 0–149)
VLDL Cholesterol Cal: 17 mg/dL (ref 5–40)

## 2023-03-04 LAB — HEMOGLOBIN A1C
Est. average glucose Bld gHb Est-mCnc: 111 mg/dL
Hgb A1c MFr Bld: 5.5 % (ref 4.8–5.6)

## 2023-03-04 LAB — MAGNESIUM: Magnesium: 2.2 mg/dL (ref 1.6–2.3)

## 2023-03-05 ENCOUNTER — Other Ambulatory Visit: Payer: Self-pay | Admitting: Family Medicine

## 2023-03-07 ENCOUNTER — Other Ambulatory Visit: Payer: Self-pay | Admitting: Family Medicine

## 2023-03-07 DIAGNOSIS — R252 Cramp and spasm: Secondary | ICD-10-CM

## 2023-03-09 ENCOUNTER — Other Ambulatory Visit: Payer: Self-pay | Admitting: Family Medicine

## 2023-03-09 DIAGNOSIS — D696 Thrombocytopenia, unspecified: Secondary | ICD-10-CM

## 2023-03-09 DIAGNOSIS — I25118 Atherosclerotic heart disease of native coronary artery with other forms of angina pectoris: Secondary | ICD-10-CM

## 2023-03-09 DIAGNOSIS — I10 Essential (primary) hypertension: Secondary | ICD-10-CM

## 2023-03-09 DIAGNOSIS — Z0000 Encounter for general adult medical examination without abnormal findings: Secondary | ICD-10-CM

## 2023-03-09 DIAGNOSIS — I5042 Chronic combined systolic (congestive) and diastolic (congestive) heart failure: Secondary | ICD-10-CM

## 2023-03-09 DIAGNOSIS — E038 Other specified hypothyroidism: Secondary | ICD-10-CM

## 2023-03-09 DIAGNOSIS — E7849 Other hyperlipidemia: Secondary | ICD-10-CM

## 2023-03-09 DIAGNOSIS — R200 Anesthesia of skin: Secondary | ICD-10-CM

## 2023-03-13 ENCOUNTER — Telehealth: Payer: Self-pay

## 2023-03-13 NOTE — Telephone Encounter (Signed)
Prescription Request  03/13/2023  LOV: Visit date not found  What is the name of the medication or equipment? Vitamin D   Have you contacted your pharmacy to request a refill? Yes   Which pharmacy would you like this sent to?  [id number] - [place], [place] - [address] [place] [address] Phone: [phone] Fax: [phone]    Patient notified that their request is being sent to the clinical staff for review and that they should receive a response within 2 business days.   Please advise at Mobile [phone] (mobile)

## 2023-03-15 NOTE — Telephone Encounter (Signed)
May refuse auto refill patient to contact us if he feels he needs it

## 2023-03-28 ENCOUNTER — Other Ambulatory Visit: Payer: Self-pay | Admitting: Family Medicine

## 2023-03-29 ENCOUNTER — Other Ambulatory Visit: Payer: Self-pay | Admitting: Family Medicine

## 2023-04-12 ENCOUNTER — Other Ambulatory Visit: Payer: Self-pay | Admitting: Family Medicine

## 2023-04-18 ENCOUNTER — Other Ambulatory Visit: Payer: Self-pay | Admitting: Family Medicine

## 2023-04-18 DIAGNOSIS — R252 Cramp and spasm: Secondary | ICD-10-CM

## 2023-04-25 ENCOUNTER — Other Ambulatory Visit: Payer: Self-pay | Admitting: Family Medicine

## 2023-04-25 DIAGNOSIS — N319 Neuromuscular dysfunction of bladder, unspecified: Secondary | ICD-10-CM

## 2023-04-26 DIAGNOSIS — R600 Localized edema: Secondary | ICD-10-CM | POA: Diagnosis not present

## 2023-04-26 DIAGNOSIS — N289 Disorder of kidney and ureter, unspecified: Secondary | ICD-10-CM | POA: Diagnosis not present

## 2023-04-26 DIAGNOSIS — N390 Urinary tract infection, site not specified: Secondary | ICD-10-CM | POA: Diagnosis not present

## 2023-04-26 DIAGNOSIS — M549 Dorsalgia, unspecified: Secondary | ICD-10-CM | POA: Diagnosis not present

## 2023-04-26 DIAGNOSIS — K219 Gastro-esophageal reflux disease without esophagitis: Secondary | ICD-10-CM | POA: Diagnosis not present

## 2023-04-26 DIAGNOSIS — R079 Chest pain, unspecified: Secondary | ICD-10-CM | POA: Diagnosis not present

## 2023-04-26 DIAGNOSIS — S2243XA Multiple fractures of ribs, bilateral, initial encounter for closed fracture: Secondary | ICD-10-CM | POA: Diagnosis not present

## 2023-04-26 DIAGNOSIS — I5023 Acute on chronic systolic (congestive) heart failure: Secondary | ICD-10-CM | POA: Diagnosis not present

## 2023-04-26 DIAGNOSIS — R296 Repeated falls: Secondary | ICD-10-CM | POA: Diagnosis not present

## 2023-04-26 DIAGNOSIS — I110 Hypertensive heart disease with heart failure: Secondary | ICD-10-CM | POA: Diagnosis not present

## 2023-04-26 DIAGNOSIS — I4891 Unspecified atrial fibrillation: Secondary | ICD-10-CM | POA: Diagnosis not present

## 2023-04-26 DIAGNOSIS — R911 Solitary pulmonary nodule: Secondary | ICD-10-CM | POA: Diagnosis not present

## 2023-04-26 DIAGNOSIS — R413 Other amnesia: Secondary | ICD-10-CM | POA: Diagnosis not present

## 2023-04-26 DIAGNOSIS — E039 Hypothyroidism, unspecified: Secondary | ICD-10-CM | POA: Diagnosis not present

## 2023-04-26 DIAGNOSIS — Z981 Arthrodesis status: Secondary | ICD-10-CM | POA: Diagnosis not present

## 2023-04-26 DIAGNOSIS — I480 Paroxysmal atrial fibrillation: Secondary | ICD-10-CM | POA: Diagnosis not present

## 2023-04-26 DIAGNOSIS — Z955 Presence of coronary angioplasty implant and graft: Secondary | ICD-10-CM | POA: Diagnosis not present

## 2023-04-26 DIAGNOSIS — M47819 Spondylosis without myelopathy or radiculopathy, site unspecified: Secondary | ICD-10-CM | POA: Diagnosis not present

## 2023-04-26 DIAGNOSIS — N179 Acute kidney failure, unspecified: Secondary | ICD-10-CM | POA: Diagnosis not present

## 2023-04-26 DIAGNOSIS — E8729 Other acidosis: Secondary | ICD-10-CM | POA: Diagnosis not present

## 2023-04-26 DIAGNOSIS — I5043 Acute on chronic combined systolic (congestive) and diastolic (congestive) heart failure: Secondary | ICD-10-CM | POA: Diagnosis not present

## 2023-04-26 DIAGNOSIS — I5033 Acute on chronic diastolic (congestive) heart failure: Secondary | ICD-10-CM | POA: Diagnosis not present

## 2023-04-26 DIAGNOSIS — I5A Non-ischemic myocardial injury (non-traumatic): Secondary | ICD-10-CM | POA: Diagnosis not present

## 2023-04-26 DIAGNOSIS — G629 Polyneuropathy, unspecified: Secondary | ICD-10-CM | POA: Diagnosis not present

## 2023-04-26 DIAGNOSIS — M5136 Other intervertebral disc degeneration, lumbar region: Secondary | ICD-10-CM | POA: Diagnosis not present

## 2023-04-26 DIAGNOSIS — E538 Deficiency of other specified B group vitamins: Secondary | ICD-10-CM | POA: Diagnosis not present

## 2023-04-26 DIAGNOSIS — E785 Hyperlipidemia, unspecified: Secondary | ICD-10-CM | POA: Diagnosis not present

## 2023-04-26 DIAGNOSIS — D693 Immune thrombocytopenic purpura: Secondary | ICD-10-CM | POA: Diagnosis not present

## 2023-04-26 DIAGNOSIS — S2231XA Fracture of one rib, right side, initial encounter for closed fracture: Secondary | ICD-10-CM | POA: Diagnosis not present

## 2023-04-26 DIAGNOSIS — F32A Depression, unspecified: Secondary | ICD-10-CM | POA: Diagnosis not present

## 2023-04-26 DIAGNOSIS — M5134 Other intervertebral disc degeneration, thoracic region: Secondary | ICD-10-CM | POA: Diagnosis not present

## 2023-04-26 DIAGNOSIS — Z7983 Long term (current) use of bisphosphonates: Secondary | ICD-10-CM | POA: Diagnosis not present

## 2023-04-26 DIAGNOSIS — I493 Ventricular premature depolarization: Secondary | ICD-10-CM | POA: Diagnosis not present

## 2023-04-26 DIAGNOSIS — Z951 Presence of aortocoronary bypass graft: Secondary | ICD-10-CM | POA: Diagnosis not present

## 2023-04-26 DIAGNOSIS — W19XXXA Unspecified fall, initial encounter: Secondary | ICD-10-CM | POA: Diagnosis not present

## 2023-04-26 DIAGNOSIS — R9431 Abnormal electrocardiogram [ECG] [EKG]: Secondary | ICD-10-CM | POA: Diagnosis not present

## 2023-04-26 DIAGNOSIS — E875 Hyperkalemia: Secondary | ICD-10-CM | POA: Diagnosis not present

## 2023-04-26 DIAGNOSIS — M4316 Spondylolisthesis, lumbar region: Secondary | ICD-10-CM | POA: Diagnosis not present

## 2023-04-26 DIAGNOSIS — I2510 Atherosclerotic heart disease of native coronary artery without angina pectoris: Secondary | ICD-10-CM | POA: Diagnosis not present

## 2023-04-26 DIAGNOSIS — R252 Cramp and spasm: Secondary | ICD-10-CM | POA: Diagnosis not present

## 2023-04-26 DIAGNOSIS — I255 Ischemic cardiomyopathy: Secondary | ICD-10-CM | POA: Diagnosis not present

## 2023-04-27 DIAGNOSIS — R296 Repeated falls: Secondary | ICD-10-CM | POA: Insufficient documentation

## 2023-04-27 DIAGNOSIS — I480 Paroxysmal atrial fibrillation: Secondary | ICD-10-CM | POA: Insufficient documentation

## 2023-04-27 DIAGNOSIS — G629 Polyneuropathy, unspecified: Secondary | ICD-10-CM | POA: Diagnosis not present

## 2023-04-27 DIAGNOSIS — I5033 Acute on chronic diastolic (congestive) heart failure: Secondary | ICD-10-CM | POA: Diagnosis not present

## 2023-04-27 DIAGNOSIS — I5032 Chronic diastolic (congestive) heart failure: Secondary | ICD-10-CM | POA: Insufficient documentation

## 2023-04-27 DIAGNOSIS — I255 Ischemic cardiomyopathy: Secondary | ICD-10-CM | POA: Insufficient documentation

## 2023-04-27 DIAGNOSIS — N179 Acute kidney failure, unspecified: Secondary | ICD-10-CM | POA: Insufficient documentation

## 2023-04-27 DIAGNOSIS — Z862 Personal history of diseases of the blood and blood-forming organs and certain disorders involving the immune mechanism: Secondary | ICD-10-CM | POA: Insufficient documentation

## 2023-04-27 DIAGNOSIS — I2510 Atherosclerotic heart disease of native coronary artery without angina pectoris: Secondary | ICD-10-CM | POA: Insufficient documentation

## 2023-04-28 DIAGNOSIS — I5033 Acute on chronic diastolic (congestive) heart failure: Secondary | ICD-10-CM | POA: Diagnosis not present

## 2023-04-28 DIAGNOSIS — I493 Ventricular premature depolarization: Secondary | ICD-10-CM | POA: Diagnosis not present

## 2023-04-28 DIAGNOSIS — R296 Repeated falls: Secondary | ICD-10-CM | POA: Diagnosis not present

## 2023-04-28 DIAGNOSIS — E538 Deficiency of other specified B group vitamins: Secondary | ICD-10-CM | POA: Insufficient documentation

## 2023-04-28 DIAGNOSIS — R9431 Abnormal electrocardiogram [ECG] [EKG]: Secondary | ICD-10-CM | POA: Diagnosis not present

## 2023-04-28 DIAGNOSIS — I4891 Unspecified atrial fibrillation: Secondary | ICD-10-CM | POA: Diagnosis not present

## 2023-04-28 DIAGNOSIS — R079 Chest pain, unspecified: Secondary | ICD-10-CM | POA: Diagnosis not present

## 2023-04-28 DIAGNOSIS — I5023 Acute on chronic systolic (congestive) heart failure: Secondary | ICD-10-CM | POA: Diagnosis not present

## 2023-04-28 DIAGNOSIS — I2510 Atherosclerotic heart disease of native coronary artery without angina pectoris: Secondary | ICD-10-CM | POA: Diagnosis not present

## 2023-04-28 DIAGNOSIS — G629 Polyneuropathy, unspecified: Secondary | ICD-10-CM | POA: Diagnosis not present

## 2023-04-29 DIAGNOSIS — I2510 Atherosclerotic heart disease of native coronary artery without angina pectoris: Secondary | ICD-10-CM | POA: Diagnosis not present

## 2023-04-29 DIAGNOSIS — I5033 Acute on chronic diastolic (congestive) heart failure: Secondary | ICD-10-CM | POA: Diagnosis not present

## 2023-04-29 DIAGNOSIS — R296 Repeated falls: Secondary | ICD-10-CM | POA: Diagnosis not present

## 2023-04-29 DIAGNOSIS — G629 Polyneuropathy, unspecified: Secondary | ICD-10-CM | POA: Diagnosis not present

## 2023-04-30 DIAGNOSIS — I5033 Acute on chronic diastolic (congestive) heart failure: Secondary | ICD-10-CM | POA: Diagnosis not present

## 2023-04-30 DIAGNOSIS — G629 Polyneuropathy, unspecified: Secondary | ICD-10-CM | POA: Diagnosis not present

## 2023-04-30 DIAGNOSIS — R296 Repeated falls: Secondary | ICD-10-CM | POA: Diagnosis not present

## 2023-04-30 DIAGNOSIS — I2510 Atherosclerotic heart disease of native coronary artery without angina pectoris: Secondary | ICD-10-CM | POA: Diagnosis not present

## 2023-04-30 DIAGNOSIS — N390 Urinary tract infection, site not specified: Secondary | ICD-10-CM | POA: Insufficient documentation

## 2023-05-01 DIAGNOSIS — I480 Paroxysmal atrial fibrillation: Secondary | ICD-10-CM | POA: Diagnosis not present

## 2023-05-01 DIAGNOSIS — R296 Repeated falls: Secondary | ICD-10-CM | POA: Diagnosis not present

## 2023-05-01 DIAGNOSIS — G629 Polyneuropathy, unspecified: Secondary | ICD-10-CM | POA: Diagnosis not present

## 2023-05-01 DIAGNOSIS — I2510 Atherosclerotic heart disease of native coronary artery without angina pectoris: Secondary | ICD-10-CM | POA: Diagnosis not present

## 2023-05-01 DIAGNOSIS — I5033 Acute on chronic diastolic (congestive) heart failure: Secondary | ICD-10-CM | POA: Diagnosis not present

## 2023-05-01 DIAGNOSIS — I5023 Acute on chronic systolic (congestive) heart failure: Secondary | ICD-10-CM | POA: Diagnosis not present

## 2023-05-02 ENCOUNTER — Other Ambulatory Visit: Payer: Self-pay | Admitting: Family Medicine

## 2023-05-05 ENCOUNTER — Telehealth: Payer: Self-pay | Admitting: *Deleted

## 2023-05-05 ENCOUNTER — Encounter: Payer: Self-pay | Admitting: *Deleted

## 2023-05-05 NOTE — Transitions of Care (Post Inpatient/ED Visit) (Signed)
05/05/2023  Name: [name] MRN: [id number] DOB: 12/11/1950  Today's TOC FU Call Status: Today's TOC FU Call Status:: Successful TOC FU Call Competed TOC FU Call Complete Date: 05/05/23  Transition Care Management Follow-up Telephone Call Date of Discharge: 05/03/23 Discharge Facility: Other ([facility]) Name of Other ([facility]) Discharge Facility: [facility] Type of Discharge: Inpatient Admission Primary Inpatient Discharge Diagnosis:: Acute on chronic CHF; paroxysmal A-Fib; falls How have you been since you were released from the hospital?: Better ("Overall I am doing better; I will find out about the watchman procedure when I go back to my heart doctor at [facility], on Tuesday 7/9.  I have started weighing myself regularly at home and this morning I weighed 271 lbs, down from 274 at the hospital") Any questions or concerns?: No  Items Reviewed: Did you receive and understand the discharge instructions provided?: Yes (briefly reviewed with patient who verbalizes good understanding of same - outside hospital AVS) Medications obtained,verified, and reconciled?: Partial Review Completed Reason for Partial Mediation Review: patient declined-- his daughter manages his medications- prepares in weekly pill box and patient then takes independently; confirmed patient obtained/ is taking all newly Rx'd medications as instructed; denies questions/ concerns around medications today: advised patient to take all newly prescribed medications to upcoming PCP office visit for HFU so medications can be added to PCP list of meds Any new allergies since your discharge?: No Dietary orders reviewed?: Yes Type of Diet Ordered:: "Fluid and salt restricted" Do you have support at home?: Yes People in Home: spouse Name of Support/Comfort Primary Source: Reports essentially independent in self-care activities; supportive spouse and local daughter assists as/ if needed/ indicated  Medications Reviewed  Today: Medications Reviewed Today     Reviewed by Michaela Corner, RN (Registered Nurse) on 05/05/23 at 1613  Med List Status: <None>   Medication Order Taking? Sig Documenting Provider Last Dose Status Informant  acetaminophen (TYLENOL) 325 MG tablet [id number] No Take 2 tablets (650 mg total) by mouth every 6 (six) hours as needed for mild pain (or Fever >/= 101).  Patient taking differently: Take 1,000 mg by mouth every 6 (six) hours as needed for mild pain (or Fever >/= 101).   AngiulliMcarthur Rossetti, PA-C Taking Active Self  albuterol (VENTOLIN HFA) 108 (90 Base) MCG/ACT inhaler [id number] No Inhale 2 puffs into the lungs every 6 (six) hours as needed for wheezing or shortness of breath. Charlton Amor, PA-C Taking Active Self  alendronate (FOSAMAX) 70 MG tablet [id number] No Take 1 tablet (70 mg total) by mouth every 7 (seven) days. Take with a full glass of water on an empty stomach. Babs Sciara, MD Taking Active   azelastine (ASTELIN) 0.1 % nasal spray [id number]  Place 2 sprays into both nostrils 2 (two) times daily. Babs Sciara, MD  Active   baclofen (LIORESAL) 10 MG tablet [id number]  TAKE 1 TABLET BY MOUTH THREE TIMES DAILY AS NEEDED FOR SPASMS [name], Scott A, MD  Active   cholecalciferol (VITAMIN D3) 25 MCG (1000 UNIT) tablet [id number] No Take 1,000 Units by mouth daily. [provider] Taking Active Self  ezetimibe (ZETIA) 10 MG tablet [id number]  TAKE 1 TABLET(10 MG) BY MOUTH DAILY [name], Scott A, MD  Active   gabapentin (NEURONTIN) 300 MG capsule [id number]  1 am 1 mid day and 2 in the evening Lilyan Punt A, MD  Active   levothyroxine (SYNTHROID) 150 MCG tablet [id number]  TAKE 1 TABLET(150 MCG) BY MOUTH DAILY [name],  Jonna Coup, MD  Active   lisinopril (ZESTRIL) 20 MG tablet [id number]  Take 1 tablet (20 mg total) by mouth daily. Babs Sciara, MD  Active   metoprolol tartrate (LOPRESSOR) 50 MG tablet [id number]  TAKE 1 TABLET(50 MG) BY MOUTH TWICE DAILY [name], Scott A,  MD  Active   Multiple Vitamin (MULTIVITAMIN) capsule [id number] No Take 1 capsule by mouth daily. [provider] Taking Active Self  nitroGLYCERIN (NITROSTAT) 0.4 MG SL tablet [id number] No PLACE 1 TABLET UNDER THE TONGUE EVERY 5 MINUTES AS NEEDED FOR CHEST PAINS. Strength: 0.4 mg Wendall Stade, MD Taking Active   pantoprazole (PROTONIX) 40 MG tablet [id number]  TAKE 1 TABLET(40 MG) BY MOUTH DAILY [name], Jonna Coup, MD  Active   polycarbophil (FIBERCON) 625 MG tablet [id number] No Take 1 tablet (625 mg total) by mouth daily. Charlton Amor, PA-C Taking Active Self  potassium chloride (KLOR-CON M) 10 MEQ tablet [id number]  TAKE 1 TABLET(10 MEQ) BY MOUTH DAILY Babs Sciara, MD  Active   rosuvastatin (CRESTOR) 40 MG tablet [id number] No TAKE 1 TABLET(40 MG) BY MOUTH EVERY EVENING [name], Jonna Coup, MD Taking Active   sertraline (ZOLOFT) 100 MG tablet [id number] No TAKE ONE TABLET BY MOUTH ONCE A DAY [name], Jonna Coup, MD Taking Active   tamsulosin (FLOMAX) 0.4 MG CAPS capsule [id number]  TAKE 2 CAPSULES BY MOUTH AT NIGHT [name], Jonna Coup, MD  Active   torsemide (DEMADEX) 20 MG tablet [id number]  1 to 2 each morning as needed Babs Sciara, MD  Active            Home Care and Equipment/Supplies: Were Home Health Services Ordered?: No Any new equipment or medical supplies ordered?: No  Functional Questionnaire: Do you need assistance with bathing/showering or dressing?: No Do you need assistance with meal preparation?: Yes (family prepares meals) Do you need assistance with eating?: No Do you have difficulty maintaining continence: No Do you need assistance with getting out of bed/getting out of a chair/moving?: No Do you have difficulty managing or taking your medications?: Yes (daughter manages medications- fills weekly pill box; patient then takes independently)  Follow up appointments reviewed: PCP Follow-up appointment confirmed?: Yes Date of PCP follow-up appointment?:  05/08/23 Follow-up Provider: PCP Specialist Hospital Follow-up appointment confirmed?: Yes Date of Specialist follow-up appointment?: 05/09/23 Follow-Up Specialty Provider:: cardiology provider at [facility] Do you need transportation to your follow-up appointment?: No Do you understand care options if your condition(s) worsen?: Yes-patient verbalized understanding  SDOH Interventions Today    Flowsheet Row Most Recent Value  SDOH Interventions   Food Insecurity Interventions Intervention Not Indicated  Transportation Interventions Intervention Not Indicated  [wife provides transportation,  daughter assists as/ if needed]      TOC Interventions Today    Flowsheet Row Most Recent Value  TOC Interventions   TOC Interventions Discussed/Reviewed TOC Interventions Discussed      Interventions Today    Flowsheet Row Most Recent Value  Chronic Disease   Chronic disease during today's visit Congestive Heart Failure (CHF), Atrial Fibrillation (AFib)  General Interventions   General Interventions Discussed/Reviewed General Interventions Discussed, Durable Medical Equipment (DME), Communication with, Doctor Visits, Referral to Nurse  Doctor Visits Discussed/Reviewed Doctor Visits Discussed, PCP, Specialist  Durable Medical Equipment (DME) Val Riles with that patient uses assistive devices on regular basis, at baseline]  PCP/Specialist Visits Compliance with follow-up visit  Communication with RN  Education Interventions   Education Provided Provided Education  Provided Verbal Education  On Other  [reinforced importance of/ rationale for monitoring/ recording daily weights at home/ need to share weights at hoem with provider,  action plan for weight gain,  fluid and salt restrictions,  need to take updated list of meds to PCP HFU OV]  Nutrition Interventions   Nutrition Discussed/Reviewed Nutrition Discussed, Decreasing salt, Fluid intake  Pharmacy Interventions   Pharmacy  Dicussed/Reviewed Pharmacy Topics Discussed  Safety Interventions   Safety Discussed/Reviewed Safety Discussed      Caryl Pina, RN, BSN, CCRN Alumnus RN CM Care Coordination/ Transition of Care- [facility] Care Management [phone]: direct office

## 2023-05-08 ENCOUNTER — Ambulatory Visit: Payer: Medicare Other | Admitting: Family Medicine

## 2023-05-08 VITALS — BP 106/71 | HR 64 | Wt 268.8 lb

## 2023-05-08 DIAGNOSIS — I5042 Chronic combined systolic (congestive) and diastolic (congestive) heart failure: Secondary | ICD-10-CM | POA: Diagnosis not present

## 2023-05-08 DIAGNOSIS — E538 Deficiency of other specified B group vitamins: Secondary | ICD-10-CM | POA: Diagnosis not present

## 2023-05-08 DIAGNOSIS — R531 Weakness: Secondary | ICD-10-CM | POA: Diagnosis not present

## 2023-05-08 DIAGNOSIS — R270 Ataxia, unspecified: Secondary | ICD-10-CM | POA: Diagnosis not present

## 2023-05-08 DIAGNOSIS — R400 Somnolence: Secondary | ICD-10-CM | POA: Diagnosis not present

## 2023-05-08 DIAGNOSIS — I509 Heart failure, unspecified: Secondary | ICD-10-CM

## 2023-05-08 MED ORDER — CYANOCOBALAMIN 1000 MCG/ML IJ SOLN
1000.0000 ug | Freq: Once | INTRAMUSCULAR | Status: AC
Start: 2023-05-08 — End: 2023-05-08
  Administered 2023-05-08: 1000 ug via INTRAMUSCULAR

## 2023-05-08 NOTE — Progress Notes (Signed)
   Subjective:    Patient ID: [name], male    DOB: [birth year], 72 y.o.   MRN: [id number]  HPI Patient arrives for hospital follow up acute CHF.  Patient was recently in the hospital had increased shortness of breath fatigue tiredness in addition to this had swelling in the legs and orthopnea.  Family was concerned about how he was doing and took him directly to [facility] and he was admitted into the hospital for congestive heart failure placed on IV diuretics.  His creatinine was normal on his last visit with [place] yet in the hospital his creatinine went from 1.6 up to a maximum of 2.0 he denied any type of chest tightness pressure pain but did relate shortness of breath.  Hospital notes were reviewed Labs were reviewed Medications were reviewed with the family today heart  Review of Systems     Objective:   Physical Exam  General-in no acute distress Eyes-no discharge Lungs-respiratory rate normal, CTA CV-no murmurs rate controlled Extremities skin warm dry no edema Neuro grossly normal Behavior normal, alert       Assessment & Plan:  CHF acute-doing better now. Blood pressure normal sitting but dropped significantly standing Sitting 126/70 standing 106/70-it is quite possible that medications will need to be adjusted he is seeing cardiology tomorrow  Daytime somnolence recommended sleep study referral recommended-I am concerned he has sleep apnea.  We will help set him up in [place] with special tests and visit with consult to  Hypothyroidism recently TSH showed level was low they reduced his Synthroid he will need to have follow-up labs in 8 to 12 weeks we will make sure this gets done  Significant renal insufficiency possibly related to diuretic use while in hospital-follow-up labs indicated it is recommended to do the following-metabolic 7 urine ACR-if still abnormal recommend consultation with nephrology-patient states he would prefer to see nephrology at [facility]  if necessary  In addition to this he deferred on doing labs today because he states that specialist tomorrow might want to do additional labs and he does not want to get stuck twice-he will give [place] feedback on how this all goes  Physical therapy [facility] referral will be placed for ataxia and leg weakness  B12 level low we will go ahead with B12 1 cc weekly for the next 4 weeks then after that once a month patient relates that the daughter can do the shots and is willing to do these she states she does not refer her sick child   Recheck in mid September has multiple follow-up visits with [facility]-we can work as a Print production planner to help him with his overall health

## 2023-05-09 ENCOUNTER — Encounter: Payer: Self-pay | Admitting: Family Medicine

## 2023-05-09 ENCOUNTER — Other Ambulatory Visit: Payer: Self-pay | Admitting: Family Medicine

## 2023-05-09 DIAGNOSIS — N179 Acute kidney failure, unspecified: Secondary | ICD-10-CM | POA: Diagnosis not present

## 2023-05-09 DIAGNOSIS — I480 Paroxysmal atrial fibrillation: Secondary | ICD-10-CM | POA: Diagnosis not present

## 2023-05-09 DIAGNOSIS — I5032 Chronic diastolic (congestive) heart failure: Secondary | ICD-10-CM | POA: Diagnosis not present

## 2023-05-09 DIAGNOSIS — I5023 Acute on chronic systolic (congestive) heart failure: Secondary | ICD-10-CM | POA: Diagnosis not present

## 2023-05-09 DIAGNOSIS — I10 Essential (primary) hypertension: Secondary | ICD-10-CM | POA: Diagnosis not present

## 2023-05-09 DIAGNOSIS — I2510 Atherosclerotic heart disease of native coronary artery without angina pectoris: Secondary | ICD-10-CM | POA: Diagnosis not present

## 2023-05-09 DIAGNOSIS — N319 Neuromuscular dysfunction of bladder, unspecified: Secondary | ICD-10-CM

## 2023-05-09 DIAGNOSIS — E038 Other specified hypothyroidism: Secondary | ICD-10-CM

## 2023-05-09 NOTE — Telephone Encounter (Signed)
Nurses-please put an order for urine ACR-apparently he is bringing a urine specimen here-that could be sent forward to [facility] upon receiving it

## 2023-05-10 NOTE — Telephone Encounter (Signed)
ACR placed in EPIC per Dr Roby Lofts request.

## 2023-05-10 NOTE — Telephone Encounter (Signed)
Nurses Please order free T4, TSH patient will do this before his follow-up visit in September Diagnosis hypothyroidism  You may also forward message to the family thanks for the updates and for the information certainly if they need any help before the September appointment please connect with [place] thank you

## 2023-05-11 ENCOUNTER — Ambulatory Visit: Payer: Self-pay | Admitting: *Deleted

## 2023-05-11 NOTE — Patient Outreach (Signed)
  Care Coordination   05/11/2023 Name: JANZEN SACKS MRN: [id number] DOB: 1951-07-23   Care Coordination Outreach Attempts:  An unsuccessful telephone outreach was attempted for a scheduled appointment today.  Follow Up Plan:  Additional outreach attempts will be made to offer the patient care coordination information and services.   Encounter Outcome:  No Answer   Care Coordination Interventions:  No, not indicated    Manasvi Dickard L. Noelle Penner, RN, BSN, CCM [facility] Care Management Community Coordinator Office number [phone]

## 2023-05-16 ENCOUNTER — Ambulatory Visit: Payer: Medicare Other | Admitting: *Deleted

## 2023-05-16 DIAGNOSIS — E538 Deficiency of other specified B group vitamins: Secondary | ICD-10-CM

## 2023-05-16 MED ORDER — CYANOCOBALAMIN 1000 MCG/ML IJ SOLN
1000.0000 ug | Freq: Once | INTRAMUSCULAR | Status: AC
Start: 2023-05-16 — End: 2023-05-16
  Administered 2023-05-16: 1000 ug via INTRAMUSCULAR

## 2023-05-23 ENCOUNTER — Other Ambulatory Visit: Payer: Self-pay | Admitting: Family Medicine

## 2023-05-23 ENCOUNTER — Ambulatory Visit (INDEPENDENT_AMBULATORY_CARE_PROVIDER_SITE_OTHER): Payer: Medicare Other

## 2023-05-23 DIAGNOSIS — R252 Cramp and spasm: Secondary | ICD-10-CM

## 2023-05-23 DIAGNOSIS — E538 Deficiency of other specified B group vitamins: Secondary | ICD-10-CM

## 2023-05-23 MED ORDER — CYANOCOBALAMIN 1000 MCG/ML IJ SOLN
1000.0000 ug | Freq: Once | INTRAMUSCULAR | Status: AC
Start: 2023-05-23 — End: 2023-05-23
  Administered 2023-05-23: 1000 ug via INTRAMUSCULAR

## 2023-05-24 ENCOUNTER — Other Ambulatory Visit: Payer: Self-pay | Admitting: Family Medicine

## 2023-05-24 DIAGNOSIS — N319 Neuromuscular dysfunction of bladder, unspecified: Secondary | ICD-10-CM

## 2023-05-25 ENCOUNTER — Telehealth: Payer: Self-pay | Admitting: *Deleted

## 2023-05-25 NOTE — Progress Notes (Signed)
  Care Coordination Note  05/25/2023 Name: [name] MRN: [id number] DOB: [birth year]  [name] is a 72 y.o. year old male who is a primary care patient of [name], Jonna Coup, MD and is actively engaged with the care management team. I reached out to [name] by phone today to assist with re-scheduling a follow up visit with the RN Case Manager  Follow up plan: Unsuccessful telephone outreach attempt made. A HIPAA compliant phone message was left for the patient providing contact information and requesting a return call.   [facility]  Care Coordination Care Guide  Direct Dial: [phone]

## 2023-05-30 ENCOUNTER — Ambulatory Visit (INDEPENDENT_AMBULATORY_CARE_PROVIDER_SITE_OTHER): Payer: Medicare Other

## 2023-05-30 DIAGNOSIS — E538 Deficiency of other specified B group vitamins: Secondary | ICD-10-CM | POA: Diagnosis not present

## 2023-05-30 MED ORDER — CYANOCOBALAMIN 1000 MCG/ML IJ SOLN
1000.0000 ug | Freq: Once | INTRAMUSCULAR | Status: AC
Start: 2023-05-30 — End: 2023-05-30
  Administered 2023-05-30: 1000 ug via INTRAMUSCULAR

## 2023-06-01 ENCOUNTER — Ambulatory Visit: Payer: Medicare Other | Admitting: Family Medicine

## 2023-06-01 NOTE — Progress Notes (Signed)
  Care Coordination Note  06/01/2023 Name: [name] MRN: [id number] DOB: 17-Dec-1950  [name] is a 72 y.o. year old male who is a primary care patient of [name], Jonna Coup, MD and is actively engaged with the care management team. I reached out to [name] by phone today to assist with re-scheduling a follow up visit with the RN Case Manager  Follow up plan: Unsuccessful telephone outreach attempt made. A HIPAA compliant phone message was left for the patient providing contact information and requesting a return call.  We have been unable to make contact with the patient for follow up. The care management team is available to follow up with the patient after provider conversation with the patient regarding recommendation for care management engagement and subsequent re-referral to the care management team.   [facility] Coordination Care Guide  Direct Dial: [phone]

## 2023-06-02 ENCOUNTER — Telehealth: Payer: Self-pay | Admitting: Family Medicine

## 2023-06-02 ENCOUNTER — Other Ambulatory Visit: Payer: Self-pay | Admitting: Nurse Practitioner

## 2023-06-02 MED ORDER — GABAPENTIN 300 MG PO CAPS: ORAL_CAPSULE | ORAL | 1 refills | Status: DC

## 2023-06-02 NOTE — Telephone Encounter (Signed)
Done

## 2023-06-02 NOTE — Telephone Encounter (Signed)
Refill on gabapentin 100 mg twice at night only. Send to [facility]

## 2023-06-03 ENCOUNTER — Other Ambulatory Visit: Payer: Self-pay | Admitting: Family Medicine

## 2023-06-03 DIAGNOSIS — M8580 Other specified disorders of bone density and structure, unspecified site: Secondary | ICD-10-CM

## 2023-06-09 ENCOUNTER — Other Ambulatory Visit: Payer: Self-pay

## 2023-06-09 ENCOUNTER — Other Ambulatory Visit: Payer: Self-pay | Admitting: Family Medicine

## 2023-06-09 DIAGNOSIS — E038 Other specified hypothyroidism: Secondary | ICD-10-CM

## 2023-06-09 DIAGNOSIS — R200 Anesthesia of skin: Secondary | ICD-10-CM

## 2023-06-09 DIAGNOSIS — I5042 Chronic combined systolic (congestive) and diastolic (congestive) heart failure: Secondary | ICD-10-CM

## 2023-06-09 DIAGNOSIS — Z0000 Encounter for general adult medical examination without abnormal findings: Secondary | ICD-10-CM

## 2023-06-09 DIAGNOSIS — D696 Thrombocytopenia, unspecified: Secondary | ICD-10-CM

## 2023-06-09 DIAGNOSIS — E7849 Other hyperlipidemia: Secondary | ICD-10-CM

## 2023-06-09 DIAGNOSIS — I10 Essential (primary) hypertension: Secondary | ICD-10-CM

## 2023-06-09 DIAGNOSIS — I25118 Atherosclerotic heart disease of native coronary artery with other forms of angina pectoris: Secondary | ICD-10-CM

## 2023-06-13 ENCOUNTER — Other Ambulatory Visit: Payer: Self-pay

## 2023-06-13 ENCOUNTER — Encounter: Payer: Self-pay | Admitting: Family Medicine

## 2023-06-13 DIAGNOSIS — M8580 Other specified disorders of bone density and structure, unspecified site: Secondary | ICD-10-CM

## 2023-06-13 DIAGNOSIS — N319 Neuromuscular dysfunction of bladder, unspecified: Secondary | ICD-10-CM

## 2023-06-13 MED ORDER — GABAPENTIN 100 MG PO CAPS: 100.0000 mg | ORAL_CAPSULE | Freq: Every day | ORAL | 1 refills | Status: DC

## 2023-06-13 NOTE — Telephone Encounter (Signed)
Daughter called and all these medications have been changed in the hospital. This is what the patient is currently taking. The gabapentin was the wrong milligrams and the daughter did not pick up from the pharmacy so that you would have new dosage.

## 2023-06-15 ENCOUNTER — Encounter: Payer: Self-pay | Admitting: Family Medicine

## 2023-06-15 ENCOUNTER — Other Ambulatory Visit: Payer: Self-pay | Admitting: Family Medicine

## 2023-06-15 MED ORDER — GABAPENTIN 100 MG PO CAPS: ORAL_CAPSULE | ORAL | 1 refills | Status: DC

## 2023-06-19 ENCOUNTER — Other Ambulatory Visit: Payer: Self-pay | Admitting: Family Medicine

## 2023-06-19 ENCOUNTER — Ambulatory Visit (INDEPENDENT_AMBULATORY_CARE_PROVIDER_SITE_OTHER): Payer: Medicare Other | Admitting: Neurology

## 2023-06-19 ENCOUNTER — Encounter: Payer: Self-pay | Admitting: Neurology

## 2023-06-19 VITALS — BP 98/51 | HR 61 | Ht 72.0 in | Wt 262.0 lb

## 2023-06-19 DIAGNOSIS — R519 Headache, unspecified: Secondary | ICD-10-CM

## 2023-06-19 DIAGNOSIS — R351 Nocturia: Secondary | ICD-10-CM

## 2023-06-19 DIAGNOSIS — I480 Paroxysmal atrial fibrillation: Secondary | ICD-10-CM | POA: Diagnosis not present

## 2023-06-19 DIAGNOSIS — G4719 Other hypersomnia: Secondary | ICD-10-CM

## 2023-06-19 DIAGNOSIS — Z951 Presence of aortocoronary bypass graft: Secondary | ICD-10-CM | POA: Diagnosis not present

## 2023-06-19 DIAGNOSIS — I5042 Chronic combined systolic (congestive) and diastolic (congestive) heart failure: Secondary | ICD-10-CM | POA: Diagnosis not present

## 2023-06-19 DIAGNOSIS — E669 Obesity, unspecified: Secondary | ICD-10-CM

## 2023-06-19 DIAGNOSIS — R0683 Snoring: Secondary | ICD-10-CM | POA: Diagnosis not present

## 2023-06-19 NOTE — Progress Notes (Signed)
Subjective:    Patient ID: [name] is a 72 y.o. male.  HPI    Huston Foley, MD, PhD [facility] [address], [address] [address] [place], [place] [address]  Dear Dr. Gerda Diss,   I saw your patient, [name], upon your kind request in my neurologic clinic today for initial consultation of his sleep disorder, in particular, concern for underlying obstructive sleep apnea.  The patient is accompanied by his daughter today.  As you know, [name] is a 72 year old male with an underlying complex medical history of chronic congestive heart failure, ITP, coronary artery disease with status post MI, status post stent placement, status post three-vessel CABG, paroxysmal A-fib with s/p cardioversion and pending Watchman procedure, cervical degenerative disc disease with history of multiple cervical surgeres, including in 2022 with complication of epidural hematoma with status post evacuation surgery in 2022, history of multiple lumbar spine surgeries, reflux disease with status post esophageal dilatation, hypertension, hyperlipidemia, hypothyroidism, and obesity, who reports some snoring and excessive daytime somnolence. His Epworth sleepiness score is 22 out of 24, fatigue severity score is 61 out of 63.  I reviewed  your office note from 05/08/2023.  Of note, he is on multiple medications including potentially sedating medications, currently on baclofen as needed, gabapentin, and sertraline. He has been on amiodarone.  He is supposed to see the electrophysiologist in early September 2024.  He lives with his wife, he has 2 daughters, he is retired, worked for a tobacco company for many years.  He quit smoking some 20 years ago.  He has reduced his caffeine intake and limits himself to 1 soda bottle per day.  He does not currently drink any alcohol. Bedtime is generally around 10 and rise time between 5 and 7 AM.  He has nocturia about once per average night, has occasional  morning headaches which are typically in the back, start in the upper spine area and radiate upwards.  He does not currently take any medication for pain.  He was encouraged to seek sleep testing by his cardiologist.  He has had some weight loss, particularly fluid weight.  He is working on weight loss as well.    His Past Medical History Is Significant For: Past Medical History:  Diagnosis Date   Allergy    Carpal tunnel syndrome    Chronic back pain    Chronic combined systolic and diastolic congestive heart failure (HCC)    Chronic ITP (idiopathic thrombocytopenia) (HCC) 05/25/2015   Coronary artery disease    Coronary artery disease involving native coronary artery of native heart with angina pectoris (HCC)    Degenerative disc disease    Epidural hematoma (HCC)    C3-4 ACDF 07/19/21, presented 07/22/21 with BLE/UE weakness/epidural hematoma, s/p revision & hematoma evacuation   GERD (gastroesophageal reflux disease)    History of hiatal hernia    History of thrombocytopenia    Hypercholesteremia    Hypertension    Hypothyroid    Lumbar pain    Myocardial infarction (HCC) 2009   Obesity    Pneumonia    around age 14   S/P CABG x 3 04/27/2018   LIMA to LAD SVG to OM1 SVG to OM2   Shortness of breath dyspnea     His Past Surgical History Is Significant For: Past Surgical History:  Procedure Laterality Date   ANTERIOR CERVICAL DECOMP/DISCECTOMY FUSION N/A 07/19/2021   Procedure: CERVICAL THREE-FOUR ANTERIOR CERVICAL DECOMPRESSION/DISCECTOMY FUSION;  Surgeon: Donalee Citrin,  MD;  Location: [facility] OR;  Service: Neurosurgery;  Laterality: N/A;   ANTERIOR CERVICAL DECOMP/DISCECTOMY FUSION N/A 07/22/2021   Procedure: ANTERIOR CERVICAL DECOMPRESSION/DISCECTOMY REVISON FUSION  WITH EVACUATION OF EPIDURAL HEMATOMA;  Surgeon: Bethann Goo, DO;  Location: [facility] OR;  Service: Neurosurgery;  Laterality: N/A;   BACK SURGERY     5 lumbas disc with cervical and lumbar fusions   BIOPSY N/A 03/06/2014    Procedure: ESOPHAGEAL BIOPSIES;  Surgeon: Malissa Hippo, MD;  Location: [facility] ORS;  Service: Endoscopy;  Laterality: N/A;   COLONOSCOPY     COLONOSCOPY WITH PROPOFOL N/A 03/06/2014   Procedure: COLONOSCOPY WITH PROPOFOL;  Surgeon: Malissa Hippo, MD;  Location: [facility]S;  Service: Endoscopy;  Laterality: N/A;  in cecum at 0807; total withdrawal time 9 minutes   CORONARY ANGIOPLASTY WITH STENT PLACEMENT     2000, and 2004 has 3 stents   CORONARY ARTERY BYPASS GRAFT N/A 04/27/2018   Procedure: CORONARY ARTERY BYPASS GRAFTING (CABG) x Three , using left internal mammary artery and right leg greater saphenous vein;  Surgeon: Purcell Nails, MD;  Location: [facility] OR;  Service: Open Heart Surgery;  Laterality: N/A;   ESOPHAGOGASTRODUODENOSCOPY (EGD) WITH PROPOFOL N/A 03/06/2014   Procedure: ESOPHAGOGASTRODUODENOSCOPY (EGD) WITH PROPOFOL;  Surgeon: Malissa Hippo, MD;  Location: [facility];  Service: Endoscopy;  Laterality: N/A;   EYE SURGERY Right 2013   "lazy eye"   JOINT REPLACEMENT Left 2011   LEFT HEART CATH AND CORONARY ANGIOGRAPHY N/A 04/24/2018   Procedure: LEFT HEART CATH AND CORONARY ANGIOGRAPHY;  Surgeon: Corky Crafts, MD;  Location: [facility] INVASIVE CV LAB;  Service: Cardiovascular;  Laterality: N/A;   LUMBAR FUSION  11/01/2007   MALONEY DILATION N/A 03/06/2014   Procedure: MALONEY DILATION 54 french;  Surgeon: Malissa Hippo, MD;  Location: [facility];  Service: Endoscopy;  Laterality: N/A;   NECK SURGERY     POSTERIOR CERVICAL FUSION/FORAMINOTOMY N/A 04/01/2022   Procedure: Post Cervical Lami/Multi level - C3-C4 - C4-C5 and fusion with lateral mass screws and posterolateral arthrodesis;  Surgeon: Donalee Citrin, MD;  Location: [facility] OR;  Service: Neurosurgery;  Laterality: N/A;   TEE WITHOUT CARDIOVERSION N/A 04/27/2018   Procedure: TRANSESOPHAGEAL ECHOCARDIOGRAM (TEE);  Surgeon: Purcell Nails, MD;  Location: [facility] OR;  Service: Open Heart Surgery;  Laterality: N/A;    His Family History Is  Significant For: Family History  Problem Relation Age of Onset   Heart attack Father    Heart disease Father    Colon cancer Maternal Grandmother    Esophageal cancer Neg Hx    Rectal cancer Neg Hx    Stomach cancer Neg Hx    Sleep apnea Neg Hx     His Social History Is Significant For: Social History   Socioeconomic History   Marital status: Married    Spouse name: Not on file   Number of children: Not on file   Years of education: Not on file   Highest education level: Not on file  Occupational History   Occupation: retireed  Tobacco Use   Smoking status: Former    Current packs/day: 0.00    Average packs/day: 2.0 packs/day for 20.0 years (40.0 ttl pk-yrs)    Types: Cigarettes    Start date: 07/06/1971    Quit date: 07/06/1991    Years since quitting: 31.9   Smokeless tobacco: Never  Vaping Use   Vaping status: Never Used  Substance and Sexual Activity   Alcohol use: Yes  Comment: occassional   Drug use: No   Sexual activity: Not Currently    Birth control/protection: None  Other Topics Concern   Not on file  Social History Narrative   Disabled due to chronic back pain   Social Determinants of Health   Financial Resource Strain: Low Risk  (04/27/2023)   Received from [facility], [facility]   Overall Financial Resource Strain (CARDIA)    Difficulty of Paying Living Expenses: Not very hard  Food Insecurity: No Food Insecurity (05/05/2023)   Hunger Vital Sign    Worried About Running Out of Food in the Last Year: Never true    Ran Out of Food in the Last Year: Never true  Transportation Needs: No Transportation Needs (05/05/2023)   PRAPARE - Administrator, Civil Service (Medical): No    Lack of Transportation (Non-Medical): No  Physical Activity: Insufficiently Active (06/28/2022)   Exercise Vital Sign    Days of Exercise per Week: 2 days    Minutes of Exercise per Session: 60 min  Stress: No Stress Concern  Present (06/28/2022)   [employer] of Occupational Health - Occupational Stress Questionnaire    Feeling of Stress : Not at all  Social Connections: Moderately Integrated (06/28/2022)   Social Connection and Isolation Panel [NHANES]    Frequency of Communication with Friends and Family: More than three times a week    Frequency of Social Gatherings with Friends and Family: More than three times a week    Attends Religious Services: More than 4 times per year    Active Member of [employer] or Organizations: No    Attends Engineer, structural: Never    Marital Status: Married    His Allergies Are:  Allergies  Allergen Reactions   Penicillins Other (See Comments)    SYNCOPAL EPISODE  Has patient had a PCN reaction causing immediate rash, facial/tongue/throat swelling, SOB or LIGHTHEADEDNESS with HYPOTENSION [SYNCOPE] #  #  #  YES  #  #  #  Has patient had a PCN reaction causing severe rash involving mucus membranes or skin necrosis:No Has patient had a PCN reaction that required hospitalization:No Has patient had a PCN reaction occurring within the last 10 years:Yes    Sulfa Antibiotics Swelling    SWELLING REACTION UNSPECIFIED > PER PREVIOUS PMH   Zoloft [Sertraline Hcl] Other (See Comments)    Confusion Patient is taking Down   :   His Current Medications Are:  Outpatient Encounter Medications as of 06/19/2023  Medication Sig   acetaminophen (TYLENOL) 325 MG tablet Take 2 tablets (650 mg total) by mouth every 6 (six) hours as needed for mild pain (or Fever >/= 101). (Patient taking differently: Take 1,000 mg by mouth every 6 (six) hours as needed for mild pain (or Fever >/= 101).)   alendronate (FOSAMAX) 70 MG tablet TAKE 1 TABLET(70 MG) BY MOUTH EVERY 7 DAYS WITH A FULL GLASS OF WATER AND ON AN EMPTY STOMACH   amiodarone (PACERONE) 200 MG tablet daily.   apixaban (ELIQUIS) 5 MG TABS tablet Take by mouth 2 (two) times daily.   ascorbic acid (VITAMIN C) 500 MG tablet Take  by mouth daily.   baclofen (LIORESAL) 10 MG tablet TAKE 1 TABLET BY MOUTH THREE TIMES DAILY AS NEEDED FOR SPASMS   cetirizine (ZYRTEC) 10 MG tablet Take by mouth.   cholecalciferol (VITAMIN D3) 25 MCG (1000 UNIT) tablet Take 1,000 Units by mouth daily.   empagliflozin (JARDIANCE)  10 MG TABS tablet Take 1 tablet by mouth daily.   ezetimibe (ZETIA) 10 MG tablet TAKE 1 TABLET(10 MG) BY MOUTH DAILY   gabapentin (NEURONTIN) 100 MG capsule 2 pills each evening   levothyroxine (SYNTHROID) 100 MCG tablet Take by mouth.   lisinopril (ZESTRIL) 20 MG tablet Take 1 tablet (20 mg total) by mouth daily. (Patient taking differently: Take 10 mg by mouth daily.)   metoprolol succinate (TOPROL-XL) 25 MG 24 hr tablet Take by mouth.   Multiple Vitamin (MULTIVITAMIN) capsule Take 1 capsule by mouth daily.   nitroGLYCERIN (NITROSTAT) 0.4 MG SL tablet PLACE 1 TABLET UNDER THE TONGUE EVERY 5 MINUTES AS NEEDED FOR CHEST PAINS. Strength: 0.4 mg   pantoprazole (PROTONIX) 40 MG tablet TAKE 1 TABLET(40 MG) BY MOUTH DAILY   potassium chloride (KLOR-CON M) 10 MEQ tablet TAKE 1 TABLET(10 MEQ) BY MOUTH DAILY   rosuvastatin (CRESTOR) 40 MG tablet TAKE 1 TABLET BY MOUTH EVERY EVENING   sertraline (ZOLOFT) 100 MG tablet TAKE ONE TABLET BY MOUTH ONCE A DAY   tamsulosin (FLOMAX) 0.4 MG CAPS capsule TAKE 2 CAPSULES BY MOUTH AT NIGHT   torsemide (DEMADEX) 20 MG tablet 1 to 2 each morning as needed (Patient taking differently: 1/2 tab midday)   polycarbophil (FIBERCON) 625 MG tablet Take 1 tablet (625 mg total) by mouth daily. (Patient not taking: Reported on 05/08/2023)   No facility-administered encounter medications on file as of 06/19/2023.  :   Review of Systems:  Out of a complete 14 point review of systems, all are reviewed and negative with the exception of these symptoms as listed below:  Review of Systems  Neurological:        Pt here for sleep consult Pt fatigue,headaches, controlled hypertension ,AFIB pt denies  snoring,sleep study,CPAP machine   ESS:22 FSS:61    Objective:  Neurological Exam  Physical Exam Physical Examination:   Vitals:   06/19/23 1353  BP: (!) 98/51  Pulse: 61    General Examination: The patient is a very pleasant 72 y.o. male in no acute distress. He appears well-developed and well-nourished and well groomed.   HEENT: Normocephalic, atraumatic, pupils are equal, round and reactive to light, extraocular tracking is good without limitation to gaze excursion or nystagmus noted. Hearing is grossly intact. Face is symmetric with normal facial animation. Speech is clear with no dysarthria noted. There is no hypophonia. There is no lip, neck/head, jaw or voice tremor. Neck is without any significant rigidity but decreased range of motion.  No carotid bruits.  Oropharynx exam reveals: mild mouth dryness, adequate dental hygiene with full dentures, moderate airway crowding secondary to small airway entry and widened uvula noted, Mallampati class III, tonsils on the smaller side.  Tongue protrudes centrally and palate elevates symmetrically, neck circumference 20 three-quarter inches.   Chest: Clear to auscultation without wheezing, rhonchi or crackles noted.  Heart: S1+S2+0, regular and normal without murmurs, rubs or gallops noted.   Abdomen: Soft, non-tender and non-distended.  Extremities: There is 1+ pitting edema in the distal lower extremities bilaterally.   Skin: Warm and dry with mild degree of redness in the distal lower extremities particularly feet.    Musculoskeletal: exam reveals limited range of motion in the neck, status post left total knee replacement, status post partial right knee replacement.    Neurologically:  Mental status: The patient is awake, alert and oriented in all 4 spheres. His immediate and remote memory, attention, language skills and fund of knowledge are appropriate. There  is no evidence of aphasia, agnosia, apraxia or anomia. Speech is clear  with normal prosody and enunciation. Thought process is linear. Mood is normal and affect is normal.  Cranial nerves II - XII are as described above under HEENT exam.  Motor exam: Normal bulk, strength and tone is noted. There is no obvious action or resting tremor.  Fine motor skills and coordination: grossly intact.  Cerebellar testing: No dysmetria or intention tremor. There is no truncal or gait ataxia.  Sensory exam: intact to light touch in the upper and lower extremities.  Gait, station and balance: He stands with mild difficulty, he walks with a 4 wheeled walker with seat.   Assessment and Plan:  In summary, [name] is a very pleasant 72 y.o.-year old male with an underlying complex medical history of chronic congestive heart failure, ITP, coronary artery disease with status post MI, status post stent placement, status post three-vessel CABG, paroxysmal A-fib with s/p cardioversion and pending Watchman procedure, cervical degenerative disc disease with history of multiple cervical surgeres, including in 2022 with complication of epidural hematoma with status post evacuation surgery in 2022, history of multiple lumbar spine surgeries, reflux disease with status post esophageal dilatation, hypertension, hyperlipidemia, hypothyroidism, and obesity, whose history and physical exam are concerning for sleep disordered breathing, particularly obstructive sleep apnea (OSA). While a laboratory attended sleep study is typically considered "gold standard" for evaluation of sleep disordered breathing, we mutually agreed to proceed with a home sleep test at this time particularly in the interest of time.   I had a long chat with the patient and [name], his daughter, about my findings and the diagnosis of sleep apnea, particularly OSA, its prognosis and treatment options. We talked about medical/conservative treatments, surgical interventions and non-pharmacological approaches for symptom control. I  explained, in particular, the risks and ramifications of untreated moderate to severe OSA, especially with respect to developing cardiovascular disease down the road, including congestive heart failure (CHF), difficult to treat hypertension, cardiac arrhythmias (particularly A-fib), neurovascular complications including TIA, stroke and dementia. Even type 2 diabetes has, in part, been linked to untreated OSA. Symptoms of untreated OSA may include (but may not be limited to) daytime sleepiness, nocturia (i.e. frequent nighttime urination), memory problems, mood irritability and suboptimally controlled or worsening mood disorder such as depression and/or anxiety, lack of energy, lack of motivation, physical discomfort, as well as recurrent headaches, especially morning or nocturnal headaches. We talked about the importance of maintaining a healthy lifestyle and striving for healthy weight.  I recommended a sleep study at this time. I outlined the differences between a laboratory attended sleep study which is considered more comprehensive and accurate over the option of a home sleep test (HST); the latter may lead to underestimation of sleep disordered breathing in some instances and does not help with diagnosing upper airway resistance syndrome and is not accurate enough to diagnose primary central sleep apnea typically. I outlined possible surgical and non-surgical treatment options of OSA, including the use of a positive airway pressure (PAP) device (i.e. CPAP, AutoPAP/APAP or BiPAP in certain circumstances), a custom-made dental device (aka oral appliance, which would require a referral to a specialist dentist or orthodontist typically, and is generally speaking not considered for patients with full dentures or edentulous state), upper airway surgical options, such as traditional UPPP (which is not considered a first-line treatment) or the Inspire device (hypoglossal nerve stimulator, which would involve a  referral for consultation with an ENT surgeon, after careful  selection, following inclusion criteria - also not first-line treatment). I explained the PAP treatment option to the patient in detail, as this is generally considered first-line treatment.  The patient indicated that he would be willing to try PAP therapy, if the need arises. I explained the importance of being compliant with PAP treatment, not only for insurance purposes but primarily to improve patient's symptoms symptoms, and for the patient's long term health benefit, including to reduce His cardiovascular risks longer-term.    We will pick up our discussion about the next steps and treatment options after testing.  We will keep him posted as to the test results by phone call and/or MyChart messaging where possible.  We will plan to follow-up in sleep clinic accordingly as well.  I answered all their questions today and the patient and his daughter were in agreement.   I encouraged him to call with any interim questions, concerns, problems or updates or email [place] through MyChart.  Generally speaking, sleep test authorizations may take up to 2 weeks, sometimes less, sometimes longer, the patient is encouraged to get in touch with [place] if they do not hear back from the sleep lab staff directly within the next 2 weeks.  Thank you very much for allowing me to participate in the care of this nice patient. If I can be of any further assistance to you please do not hesitate to call me at [phone].  Sincerely,   Huston Foley, MD, PhD

## 2023-06-19 NOTE — Patient Instructions (Signed)

## 2023-06-20 DIAGNOSIS — I5030 Unspecified diastolic (congestive) heart failure: Secondary | ICD-10-CM | POA: Diagnosis not present

## 2023-06-20 DIAGNOSIS — S1093XA Contusion of unspecified part of neck, initial encounter: Secondary | ICD-10-CM | POA: Diagnosis not present

## 2023-06-20 DIAGNOSIS — I480 Paroxysmal atrial fibrillation: Secondary | ICD-10-CM | POA: Diagnosis not present

## 2023-06-22 DIAGNOSIS — I480 Paroxysmal atrial fibrillation: Secondary | ICD-10-CM | POA: Diagnosis not present

## 2023-06-22 DIAGNOSIS — Z862 Personal history of diseases of the blood and blood-forming organs and certain disorders involving the immune mechanism: Secondary | ICD-10-CM | POA: Diagnosis not present

## 2023-06-22 DIAGNOSIS — R296 Repeated falls: Secondary | ICD-10-CM | POA: Diagnosis not present

## 2023-06-22 DIAGNOSIS — Z9189 Other specified personal risk factors, not elsewhere classified: Secondary | ICD-10-CM | POA: Diagnosis not present

## 2023-06-30 ENCOUNTER — Ambulatory Visit (INDEPENDENT_AMBULATORY_CARE_PROVIDER_SITE_OTHER): Payer: Medicare Other

## 2023-06-30 DIAGNOSIS — E538 Deficiency of other specified B group vitamins: Secondary | ICD-10-CM

## 2023-06-30 MED ORDER — CYANOCOBALAMIN 1000 MCG/ML IJ SOLN
1000.0000 ug | Freq: Once | INTRAMUSCULAR | Status: AC
Start: 2023-06-30 — End: 2023-06-30
  Administered 2023-06-30: 1000 ug via INTRAMUSCULAR

## 2023-07-04 ENCOUNTER — Ambulatory Visit (INDEPENDENT_AMBULATORY_CARE_PROVIDER_SITE_OTHER): Payer: Medicare Other | Admitting: Neurology

## 2023-07-04 DIAGNOSIS — G4733 Obstructive sleep apnea (adult) (pediatric): Secondary | ICD-10-CM

## 2023-07-04 DIAGNOSIS — E669 Obesity, unspecified: Secondary | ICD-10-CM

## 2023-07-04 DIAGNOSIS — R351 Nocturia: Secondary | ICD-10-CM

## 2023-07-04 DIAGNOSIS — G4719 Other hypersomnia: Secondary | ICD-10-CM

## 2023-07-04 DIAGNOSIS — I480 Paroxysmal atrial fibrillation: Secondary | ICD-10-CM

## 2023-07-04 DIAGNOSIS — R0683 Snoring: Secondary | ICD-10-CM

## 2023-07-04 DIAGNOSIS — I5042 Chronic combined systolic (congestive) and diastolic (congestive) heart failure: Secondary | ICD-10-CM

## 2023-07-04 DIAGNOSIS — R519 Headache, unspecified: Secondary | ICD-10-CM

## 2023-07-04 DIAGNOSIS — Z951 Presence of aortocoronary bypass graft: Secondary | ICD-10-CM

## 2023-07-06 DIAGNOSIS — I4819 Other persistent atrial fibrillation: Secondary | ICD-10-CM | POA: Diagnosis not present

## 2023-07-06 DIAGNOSIS — Z862 Personal history of diseases of the blood and blood-forming organs and certain disorders involving the immune mechanism: Secondary | ICD-10-CM | POA: Diagnosis not present

## 2023-07-06 NOTE — Procedures (Signed)
[facility]  HOME SLEEP TEST (Watch PAT) REPORT  STUDY DATE: 07/04/2023  DOB: 06/24/1951  MRN: [id number]  ORDERING CLINICIAN: Huston Foley, MD, PhD   REFERRING CLINICIAN: Babs Sciara, MD   CLINICAL INFORMATION/HISTORY: 72 year old male with an underlying complex medical history of chronic congestive heart failure, ITP, coronary artery disease with status post MI, status post stent placement, status post three-vessel CABG, paroxysmal A-fib with s/p cardioversion and pending Watchman procedure, cervical degenerative disc disease with history of multiple cervical surgeres, including in 2022 with complication of epidural hematoma with status post evacuation surgery in 2022, history of multiple lumbar spine surgeries, reflux disease with status post esophageal dilatation, hypertension, hyperlipidemia, hypothyroidism, and obesity, who reports some snoring and excessive daytime somnolence.   Epworth sleepiness score: 22/24.  BMI: 35.5 kg/m  FINDINGS:   Sleep Summary:   Total Recording Time (hours, min): 8 hours, 32 min  Total Sleep Time (hours, min):  6 hours, 18 min  Percent REM (%):    24.7%   Respiratory Indices:   Calculated pAHI (per hour):  10.6/hour         REM pAHI:    12.6/hour       NREM pAHI: 9.9/hour  Central pAHI: 0.7/hour  Oxygen Saturation Statistics:    Oxygen Saturation (%) Mean: 94%   Minimum oxygen saturation (%):                 80%   O2 Saturation Range (%): 80-98%    O2 Saturation (minutes) <=88%: 0.1 min  Pulse Rate Statistics:   Pulse Mean (bpm):    50/min    Pulse Range (39-87/min)   IMPRESSION: OSA (obstructive sleep apnea)   RECOMMENDATION:  This home sleep test demonstrates overall mild obstructive sleep apnea with a total AHI of 10.6/hour and O2 nadir of 80%. Snoring was detected, and was intermittent, in the mild to moderate range. Given the patient's medical history and sleep related complaints, therapy  with a positive airway pressure device is recommended. Treatment can be achieved in the form of autoPAP trial/titration at home for now. A full night, in-lab PAP titration study may aid in improving proper treatment settings and with mask fit, if needed, down the road. Alternative treatments may include weight loss (where appropriate) along with avoidance of the supine sleep position (if possible), or an oral appliance in appropriate candidates.   Please note that untreated obstructive sleep apnea may carry additional perioperative morbidity. Patients with significant obstructive sleep apnea should receive perioperative PAP therapy and the surgeons and particularly the anesthesiologist should be informed of the diagnosis and the severity of the sleep disordered breathing. The patient should be cautioned not to drive, work at heights, or operate dangerous or heavy equipment when tired or sleepy. Review and reiteration of good sleep hygiene measures should be pursued with any patient. Other causes of the patient's symptoms, including circadian rhythm disturbances, an underlying mood disorder, medication effect and/or an underlying medical problem cannot be ruled out based on this test. Clinical correlation is recommended.  The patient and his referring provider will be notified of the test results. The patient will be seen in follow up in sleep clinic at [facility], as necessary.  I certify that I have reviewed the raw data recording prior to the issuance of this report in accordance with the standards of the American Academy of Sleep Medicine (AASM).    INTERPRETING PHYSICIAN:   Huston Foley, MD, PhD Medical Director,  [facility]) Diplomat, ABPN (Neurology and Sleep)   [facility] [address], [address] [place], [place] [address] [phone]

## 2023-07-06 NOTE — Addendum Note (Signed)
Addended by: Huston Foley on: 07/06/2023 05:09 PM   Modules accepted: Orders

## 2023-07-07 ENCOUNTER — Ambulatory Visit (INDEPENDENT_AMBULATORY_CARE_PROVIDER_SITE_OTHER): Payer: Medicare Other

## 2023-07-07 VITALS — BP 126/72 | Ht 73.0 in | Wt 264.0 lb

## 2023-07-07 DIAGNOSIS — Z0000 Encounter for general adult medical examination without abnormal findings: Secondary | ICD-10-CM | POA: Diagnosis not present

## 2023-07-07 NOTE — Patient Instructions (Signed)
[name] , Thank you for taking time to come for your Medicare Wellness Visit. I appreciate your ongoing commitment to your health goals. Please review the following plan we discussed and let me know if I can assist you in the future.   Referrals/Orders/Follow-Ups/Clinician Recommendations:   This is a list of the screening recommended for you and due dates:  Health Maintenance  Topic Date Due   Zoster (Shingles) Vaccine (1 of 2) Never done   COVID-19 Vaccine (3 - Moderna risk series) 03/10/2020   Flu Shot  01/29/2024*   Medicare Annual Wellness Visit  07/06/2024   Colon Cancer Screening  03/18/2027   DTaP/Tdap/Td vaccine (2 - Tdap) 08/14/2031   Pneumonia Vaccine  Completed   Hepatitis C Screening  Completed   HPV Vaccine  Aged Out  *Topic was postponed. The date shown is not the original due date.    Advanced directives: (ACP Link)Information on Advanced Care Planning can be found at [facility] Advance Health Care Directives Advance Health Care Directives ([email or web address]   Next Medicare Annual Wellness Visit scheduled for next year: Yes  Preventive Care 65 Years and Older, Male Preventive care refers to lifestyle choices and visits with your health care provider that can promote health and wellness. Preventive care visits are also called wellness exams. What can I expect for my preventive care visit? Counseling During your preventive care visit, your health care provider may ask about your: Medical history, including: Past medical problems. Family medical history. History of falls. Current health, including: Emotional well-being. Home life and relationship well-being. Sexual activity. Memory and ability to understand (cognition). Lifestyle, including: Alcohol, nicotine or tobacco, and drug use. Access to firearms. Diet, exercise, and sleep habits. Work and work Astronomer. Sunscreen use. Safety issues such as seatbelt and bike helmet use. Physical  exam Your health care provider will check your: Height and weight. These may be used to calculate your BMI (body mass index). BMI is a measurement that tells if you are at a healthy weight. Waist circumference. This measures the distance around your waistline. This measurement also tells if you are at a healthy weight and may help predict your risk of certain diseases, such as type 2 diabetes and high blood pressure. Heart rate and blood pressure. Body temperature. Skin for abnormal spots. What immunizations do I need?  Vaccines are usually given at various ages, according to a schedule. Your health care provider will recommend vaccines for you based on your age, medical history, and lifestyle or other factors, such as travel or where you work. What tests do I need? Screening Your health care provider may recommend screening tests for certain conditions. This may include: Lipid and cholesterol levels. Diabetes screening. This is done by checking your blood sugar (glucose) after you have not eaten for a while (fasting). Hepatitis C test. Hepatitis B test. HIV (human immunodeficiency virus) test. STI (sexually transmitted infection) testing, if you are at risk. Lung cancer screening. Colorectal cancer screening. Prostate cancer screening. Abdominal aortic aneurysm (AAA) screening. You may need this if you are a current or former smoker. Talk with your health care provider about your test results, treatment options, and if necessary, the need for more tests. Follow these instructions at home: Eating and drinking  Eat a diet that includes fresh fruits and vegetables, whole grains, lean protein, and low-fat dairy products. Limit your intake of foods with high amounts of sugar, saturated fats, and salt. Take vitamin and mineral supplements as recommended  by your health care provider. Do not drink alcohol if your health care provider tells you not to drink. If you drink alcohol: Limit how  much you have to 0-2 drinks a day. Know how much alcohol is in your drink. In the [place] one drink equals one 12 oz bottle of beer (355 mL), one 5 oz glass of wine (148 mL), or one 1 oz glass of hard liquor (44 mL). Lifestyle Brush your teeth every morning and night with fluoride toothpaste. Floss one time each day. Exercise for at least 30 minutes 5 or more days each week. Do not use any products that contain nicotine or tobacco. These products include cigarettes, chewing tobacco, and vaping devices, such as e-cigarettes. If you need help quitting, ask your health care provider. Do not use drugs. If you are sexually active, practice safe sex. Use a condom or other form of protection to prevent STIs. Take aspirin only as told by your health care provider. Make sure that you understand how much to take and what form to take. Work with your health care provider to find out whether it is safe and beneficial for you to take aspirin daily. Ask your health care provider if you need to take a cholesterol-lowering medicine (statin). Find healthy ways to manage stress, such as: Meditation, yoga, or listening to music. Journaling. Talking to a trusted person. Spending time with friends and family. Safety Always wear your seat belt while driving or riding in a vehicle. Do not drive: If you have been drinking alcohol. Do not ride with someone who has been drinking. When you are tired or distracted. While texting. If you have been using any mind-altering substances or drugs. Wear a helmet and other protective equipment during sports activities. If you have firearms in your house, make sure you follow all gun safety procedures. Minimize exposure to UV radiation to reduce your risk of skin cancer. What's next? Visit your health care provider once a year for an annual wellness visit. Ask your health care provider how often you should have your eyes and teeth checked. Stay up to date on all  vaccines. This information is not intended to replace advice given to you by your health care provider. Make sure you discuss any questions you have with your health care provider. Document Revised: 04/14/2021 Document Reviewed: 04/14/2021 Elsevier Patient Education  2024 [employer]. Understanding Your Risk for Falls Millions of people have serious injuries from falls each year. It is important to understand your risk of falling. Talk with your health care provider about your risk and what you can do to lower it. If you do have a serious fall, make sure to tell your provider. Falling once raises your risk of falling again. How can falls affect me? Serious injuries from falls are common. These include: Broken bones, such as hip fractures. Head injuries, such as traumatic brain injuries (TBI) or concussions. A fear of falling can cause you to avoid activities and stay at home. This can make your muscles weaker and raise your risk for a fall. What can increase my risk? There are a number of risk factors that increase your risk for falling. The more risk factors you have, the higher your risk of falling. Serious injuries from a fall happen most often to people who are older than 72 years old. Teenagers and young adults ages 70-29 are also at higher risk. Common risk factors include: Weakness in the lower body. Being generally weak or confused due to  long-term (chronic) illness. Dizziness or balance problems. Poor vision. Medicines that cause dizziness or drowsiness. These may include: Medicines for your blood pressure, heart, anxiety, insomnia, or swelling (edema). Pain medicines. Muscle relaxants. Other risk factors include: Drinking alcohol. Having had a fall in the past. Having foot pain or wearing improper footwear. Working at a dangerous job. Having any of the following in your home: Tripping hazards, such as floor clutter or loose rugs. Poor lighting. Pets. Having dementia or  memory loss. What actions can I take to lower my risk of falling?     Physical activity Stay physically fit. Do strength and balance exercises. Consider taking a regular class to build strength and balance. Yoga and tai chi are good options. Vision Have your eyes checked every year and your prescription for glasses or contacts updated as needed. Shoes and walking aids Wear non-skid shoes. Wear shoes that have rubber soles and low heels. Do not wear high heels. Do not walk around the house in socks or slippers. Use a cane or walker as told by your provider. Home safety Attach secure railings on both sides of your stairs. Install grab bars for your bathtub, shower, and toilet. Use a non-skid mat in your bathtub or shower. Attach bath mats securely with double-sided, non-slip rug tape. Use good lighting in all rooms. Keep a flashlight near your bed. Make sure there is a clear path from your bed to the bathroom. Use night-lights. Do not use throw rugs. Make sure all carpeting is taped or tacked down securely. Remove all clutter from walkways and stairways, including extension cords. Repair uneven or broken steps and floors. Avoid walking on icy or slippery surfaces. Walk on the grass instead of on icy or slick sidewalks. Use ice melter to get rid of ice on walkways in the winter. Use a cordless phone. Questions to ask your health care provider Can you help me check my risk for a fall? Do any of my medicines make me more likely to fall? Should I take a vitamin D supplement? What exercises can I do to improve my strength and balance? Should I make an appointment to have my vision checked? Do I need a bone density test to check for weak bones (osteoporosis)? Would it help to use a cane or a walker? Where to find more information Centers for Disease Control and Prevention, STEAD[employer]: TonerPromos.no Community-Based Fall Prevention Programs: TonerPromos.no [employer] on Aging: BaseRingTones.pl Contact a  health care provider if: You fall at home. You are afraid of falling at home. You feel weak, drowsy, or dizzy. This information is not intended to replace advice given to you by your health care provider. Make sure you discuss any questions you have with your health care provider. Document Revised: 06/20/2022 Document Reviewed: 06/20/2022 Elsevier Patient Education  2024 [employer].

## 2023-07-07 NOTE — Progress Notes (Signed)
Because this visit was a virtual/telehealth visit,  certain criteria was not obtained, such a blood pressure, CBG if patient is a diabetic, and timed get up and go. Any medications not marked as "taking" was not mentioned during the medication reconciliation part of the visit. Any vitals not documented were not able to be obtained due to this being a telehealth visit. Vitals that have been documented are verbally provided by the patient.  Patient was unable to self-report a recent blood pressure reading due to a lack of equipment at home via telehealth.  Subjective:   [name] is a 72 y.o. male who presents for Medicare Annual/Subsequent preventive examination.  Visit Complete: Virtual  I connected with  [name] on 07/07/23 by a audio enabled telemedicine application and verified that I am speaking with the correct person using two identifiers.  Patient Location: Home  Provider Location: Home Office  I discussed the limitations of evaluation and management by telemedicine. The patient expressed understanding and agreed to proceed.  Patient Medicare AWV questionnaire was completed by the patient on na; I have confirmed that all information answered by patient is correct and no changes since this date.  Review of Systems     Cardiac Risk Factors include: advanced age (>73men, >19 women);dyslipidemia;hypertension;male gender;obesity (BMI >30kg/m2);sedentary lifestyle     Objective:    Today's Vitals   07/07/23 1259 07/07/23 1300  BP: 126/72   Weight: 264 lb (119.7 kg)   Height: 6\' 1"  (1.854 m)   PainSc:  6    Body mass index is 34.83 kg/m.     07/07/2023    1:05 PM 06/28/2022    2:51 PM 04/01/2022    3:21 PM 03/23/2022    3:14 PM 09/14/2021    2:47 PM 08/04/2021    2:00 PM 08/04/2021   12:00 PM  Advanced Directives  Does Patient Have a Medical Advance Directive? No No Yes Yes Yes Yes Yes  Type of Chief of Staff of  [place];Living will Healthcare Power of [employer] Power of [employer] Power of Attorney  Does patient want to make changes to medical advance directive?   No - Patient declined No - Patient declined No - Patient declined No - Patient declined No - Patient declined  Copy of Healthcare Power of Attorney in Chart?   No - copy requested No - copy requested No - copy requested No - copy requested   Would patient like information on creating a medical advance directive? No - Patient declined No - Patient declined   No - Patient declined No - Patient declined     Current Medications (verified) Outpatient Encounter Medications as of 07/07/2023  Medication Sig   acetaminophen (TYLENOL) 325 MG tablet Take 2 tablets (650 mg total) by mouth every 6 (six) hours as needed for mild pain (or Fever >/= 101). (Patient taking differently: Take 1,000 mg by mouth every 6 (six) hours as needed for mild pain (or Fever >/= 101).)   alendronate (FOSAMAX) 70 MG tablet TAKE 1 TABLET(70 MG) BY MOUTH EVERY 7 DAYS WITH A FULL GLASS OF WATER AND ON AN EMPTY STOMACH   amiodarone (PACERONE) 200 MG tablet daily.   apixaban (ELIQUIS) 5 MG TABS tablet Take by mouth 2 (two) times daily.   ascorbic acid (VITAMIN C) 500 MG tablet Take by mouth daily.   baclofen (LIORESAL) 10 MG tablet TAKE 1 TABLET BY MOUTH THREE TIMES DAILY AS NEEDED FOR  SPASMS   cetirizine (ZYRTEC) 10 MG tablet Take by mouth.   cholecalciferol (VITAMIN D3) 25 MCG (1000 UNIT) tablet Take 1,000 Units by mouth daily.   empagliflozin (JARDIANCE) 10 MG TABS tablet Take 1 tablet by mouth daily.   ezetimibe (ZETIA) 10 MG tablet TAKE 1 TABLET(10 MG) BY MOUTH DAILY   gabapentin (NEURONTIN) 100 MG capsule 2 pills each evening   levothyroxine (SYNTHROID) 100 MCG tablet Take by mouth.   lisinopril (ZESTRIL) 20 MG tablet Take 1 tablet (20 mg total) by mouth daily. (Patient taking differently: Take 10 mg by mouth daily.)   metoprolol succinate (TOPROL-XL) 25  MG 24 hr tablet Take by mouth.   Multiple Vitamin (MULTIVITAMIN) capsule Take 1 capsule by mouth daily.   nitroGLYCERIN (NITROSTAT) 0.4 MG SL tablet PLACE 1 TABLET UNDER THE TONGUE EVERY 5 MINUTES AS NEEDED FOR CHEST PAINS. Strength: 0.4 mg   pantoprazole (PROTONIX) 40 MG tablet TAKE 1 TABLET(40 MG) BY MOUTH DAILY   potassium chloride (KLOR-CON M) 10 MEQ tablet TAKE 1 TABLET(10 MEQ) BY MOUTH DAILY   rosuvastatin (CRESTOR) 40 MG tablet TAKE 1 TABLET BY MOUTH EVERY EVENING   sertraline (ZOLOFT) 100 MG tablet TAKE ONE TABLET BY MOUTH ONCE A DAY   tamsulosin (FLOMAX) 0.4 MG CAPS capsule TAKE 2 CAPSULES BY MOUTH AT NIGHT   torsemide (DEMADEX) 20 MG tablet 1 to 2 each morning as needed (Patient taking differently: 1/2 tab midday)   polycarbophil (FIBERCON) 625 MG tablet Take 1 tablet (625 mg total) by mouth daily.   No facility-administered encounter medications on file as of 07/07/2023.    Allergies (verified) Penicillins, Sulfa antibiotics, and Zoloft [sertraline hcl]   History: Past Medical History:  Diagnosis Date   Allergy    Carpal tunnel syndrome    Chronic back pain    Chronic combined systolic and diastolic congestive heart failure (HCC)    Chronic ITP (idiopathic thrombocytopenia) (HCC) 05/25/2015   Coronary artery disease    Coronary artery disease involving native coronary artery of native heart with angina pectoris (HCC)    Degenerative disc disease    Epidural hematoma (HCC)    C3-4 ACDF 07/19/21, presented 07/22/21 with BLE/UE weakness/epidural hematoma, s/p revision & hematoma evacuation   GERD (gastroesophageal reflux disease)    History of hiatal hernia    History of thrombocytopenia    Hypercholesteremia    Hypertension    Hypothyroid    Lumbar pain    Myocardial infarction (HCC) 2009   Obesity    Pneumonia    around age 50   S/P CABG x 3 04/27/2018   LIMA to LAD SVG to OM1 SVG to OM2   Shortness of breath dyspnea    Past Surgical History:  Procedure Laterality  Date   ANTERIOR CERVICAL DECOMP/DISCECTOMY FUSION N/A 07/19/2021   Procedure: CERVICAL THREE-FOUR ANTERIOR CERVICAL DECOMPRESSION/DISCECTOMY FUSION;  Surgeon: Donalee Citrin, MD;  Location: [facility] OR;  Service: Neurosurgery;  Laterality: N/A;   ANTERIOR CERVICAL DECOMP/DISCECTOMY FUSION N/A 07/22/2021   Procedure: ANTERIOR CERVICAL DECOMPRESSION/DISCECTOMY REVISON FUSION  WITH EVACUATION OF EPIDURAL HEMATOMA;  Surgeon: Bethann Goo, DO;  Location: [facility] OR;  Service: Neurosurgery;  Laterality: N/A;   BACK SURGERY     5 lumbas disc with cervical and lumbar fusions   BIOPSY N/A 03/06/2014   Procedure: ESOPHAGEAL BIOPSIES;  Surgeon: Malissa Hippo, MD;  Location: [facility] ORS;  Service: Endoscopy;  Laterality: N/A;   COLONOSCOPY     COLONOSCOPY WITH PROPOFOL N/A 03/06/2014   Procedure: COLONOSCOPY  WITH PROPOFOL;  Surgeon: Malissa Hippo, MD;  Location: [facility] ORS;  Service: Endoscopy;  Laterality: N/A;  in cecum at 0807; total withdrawal time 9 minutes   CORONARY ANGIOPLASTY WITH STENT PLACEMENT     2000, and 2004 has 3 stents   CORONARY ARTERY BYPASS GRAFT N/A 04/27/2018   Procedure: CORONARY ARTERY BYPASS GRAFTING (CABG) x Three , using left internal mammary artery and right leg greater saphenous vein;  Surgeon: Purcell Nails, MD;  Location: [facility] OR;  Service: Open Heart Surgery;  Laterality: N/A;   ESOPHAGOGASTRODUODENOSCOPY (EGD) WITH PROPOFOL N/A 03/06/2014   Procedure: ESOPHAGOGASTRODUODENOSCOPY (EGD) WITH PROPOFOL;  Surgeon: Malissa Hippo, MD;  Location: [facility] ORS;  Service: Endoscopy;  Laterality: N/A;   EYE SURGERY Right 2013   "lazy eye"   JOINT REPLACEMENT Left 2011   LEFT HEART CATH AND CORONARY ANGIOGRAPHY N/A 04/24/2018   Procedure: LEFT HEART CATH AND CORONARY ANGIOGRAPHY;  Surgeon: Corky Crafts, MD;  Location: [facility] INVASIVE CV LAB;  Service: Cardiovascular;  Laterality: N/A;   LUMBAR FUSION  11/01/2007   MALONEY DILATION N/A 03/06/2014   Procedure: MALONEY DILATION 54 french;  Surgeon:  Malissa Hippo, MD;  Location: [facility] ORS;  Service: Endoscopy;  Laterality: N/A;   NECK SURGERY     POSTERIOR CERVICAL FUSION/FORAMINOTOMY N/A 04/01/2022   Procedure: Post Cervical Lami/Multi level - C3-C4 - C4-C5 and fusion with lateral mass screws and posterolateral arthrodesis;  Surgeon: Donalee Citrin, MD;  Location: [facility] OR;  Service: Neurosurgery;  Laterality: N/A;   TEE WITHOUT CARDIOVERSION N/A 04/27/2018   Procedure: TRANSESOPHAGEAL ECHOCARDIOGRAM (TEE);  Surgeon: Purcell Nails, MD;  Location: [facility] OR;  Service: Open Heart Surgery;  Laterality: N/A;   Family History  Problem Relation Age of Onset   Heart attack Father    Heart disease Father    Colon cancer Maternal Grandmother    Esophageal cancer Neg Hx    Rectal cancer Neg Hx    Stomach cancer Neg Hx    Sleep apnea Neg Hx    Social History   Socioeconomic History   Marital status: Married    Spouse name: Not on file   Number of children: Not on file   Years of education: Not on file   Highest education level: Not on file  Occupational History   Occupation: retireed  Tobacco Use   Smoking status: Former    Current packs/day: 0.00    Average packs/day: 2.0 packs/day for 20.0 years (40.0 ttl pk-yrs)    Types: Cigarettes    Start date: 07/06/1971    Quit date: 07/06/1991    Years since quitting: 32.0   Smokeless tobacco: Never  Vaping Use   Vaping status: Never Used  Substance and Sexual Activity   Alcohol use: Yes    Comment: occassional   Drug use: No   Sexual activity: Not Currently    Birth control/protection: None  Other Topics Concern   Not on file  Social History Narrative   Disabled due to chronic back pain   Social Determinants of Health   Financial Resource Strain: Low Risk  (07/07/2023)   Overall Financial Resource Strain (CARDIA)    Difficulty of Paying Living Expenses: Not hard at all  Food Insecurity: No Food Insecurity (07/07/2023)   Hunger Vital Sign    Worried About Running Out of Food in the Last Year:  Never true    Ran Out of Food in the Last Year: Never true  Transportation Needs: No Transportation Needs (  07/07/2023)   PRAPARE - Administrator, Civil Service (Medical): No    Lack of Transportation (Non-Medical): No  Physical Activity: Insufficiently Active (07/07/2023)   Exercise Vital Sign    Days of Exercise per Week: 3 days    Minutes of Exercise per Session: 20 min  Stress: Stress Concern Present (07/07/2023)   [employer] of Occupational Health - Occupational Stress Questionnaire    Feeling of Stress : Rather much  Social Connections: Moderately Integrated (07/07/2023)   Social Connection and Isolation Panel [NHANES]    Frequency of Communication with Friends and Family: More than three times a week    Frequency of Social Gatherings with Friends and Family: More than three times a week    Attends Religious Services: More than 4 times per year    Active Member of [employer] or Organizations: No    Attends Engineer, structural: Never    Marital Status: Married    Tobacco Counseling Counseling given: Yes   Clinical Intake:  Pre-visit preparation completed: Yes  Pain : 0-10 Pain Score: 6  Pain Type: Neuropathic pain Pain Location: Foot Pain Orientation: Right, Left Pain Descriptors / Indicators: Burning, Pins and needles Pain Onset: More than a month ago Pain Frequency: Constant     BMI - recorded: 34.83 Nutritional Status: BMI > 30  Obese Nutritional Risks: None Diabetes: No  How often do you need to have someone help you when you read instructions, pamphlets, or other written materials from your doctor or pharmacy?: 1 - Never  Interpreter Needed?: No  Information entered by :: Abby Rebekha Diveley, CMA   Activities of Daily Living    07/07/2023    1:02 PM  In your present state of health, do you have any difficulty performing the following activities:  Hearing? 0  Vision? 0  Difficulty concentrating or making decisions? 0  Walking or climbing  stairs? 1  Comment uses walker  Dressing or bathing? 0  Doing errands, shopping? 1  Comment pt no longer drives and his wife takes him when he needs to go.  Preparing Food and eating ? Y  Comment pt can prepare simple foods such as a sandwhich  Using the Toilet? N  In the past six months, have you accidently leaked urine? N  Do you have problems with loss of bowel control? N  Managing your Medications? Y  Comment daughter helps with this  Managing your Finances? N  Housekeeping or managing your Housekeeping? Y  Comment wife does this    Patient Care Team: Babs Sciara, MD as PCP - General (Family Medicine) Wendall Stade, MD as PCP - Cardiology (Cardiology) Myrtie Neither Andreas Blower, MD as Consulting Physician (Gastroenterology) Karie Soda, MD as Consulting Physician (General Surgery) Wendall Stade, MD as Consulting Physician (Cardiology) Benjiman Core, MD (Inactive) as Consulting Physician (Hematology and Oncology)  Indicate any recent Medical Services you may have received from other than [facility] providers in the past year (date may be approximate).     Assessment:   This is a routine wellness examination for [place].  Hearing/Vision screen Hearing Screening - Comments:: Patient denies any hearing difficulties.   Vision Screening - Comments:: .Wears rx glasses - up to date with routine eye exams with the [facility] in [place]   Goals Addressed             This Visit's Progress    Patient Stated       Stay in good health  Depression Screen    07/07/2023    1:07 PM 05/08/2023   11:39 AM 08/09/2022    3:09 PM 06/28/2022    2:49 PM 12/10/2021    2:41 PM 09/06/2021    1:05 PM 04/30/2021    1:29 PM  PHQ 2/9 Scores  PHQ - 2 Score 4 5 2 1  0 1 0  PHQ- 9 Score 6 12 7 1  3      Fall Risk    07/07/2023    1:06 PM 05/08/2023   11:39 AM 03/02/2023   11:16 AM 06/28/2022    2:52 PM 12/10/2021    2:41 PM  Fall Risk   Falls in the past year? 1 1 1  0 1  Number falls in  past yr: 1 1 1  0 1  Injury with Fall? 1 1 0 0 1  Risk for fall due to : History of fall(s);Impaired balance/gait;Orthopedic patient   No Fall Risks History of fall(s)  Follow up Education provided;Falls prevention discussed   Falls prevention discussed     MEDICARE RISK AT HOME: Medicare Risk at Home Any stairs in or around the home?: No If so, are there any without handrails?: No Home free of loose throw rugs in walkways, pet beds, electrical cords, etc?: Yes Adequate lighting in your home to reduce risk of falls?: Yes Life alert?: No Use of a cane, walker or w/c?: Yes Grab bars in the bathroom?: Yes Shower chair or bench in shower?: Yes Elevated toilet seat or a handicapped toilet?: Yes  TIMED UP AND GO:  Was the test performed?  No    Cognitive Function:        07/07/2023    1:06 PM 06/28/2022    2:54 PM  6CIT Screen  What Year? 0 points 0 points  What month? 0 points 0 points  What time? 0 points 0 points  Count back from 20 0 points 0 points  Months in reverse 0 points 0 points  Repeat phrase 0 points 0 points  Total Score 0 points 0 points    Immunizations Immunization History  Administered Date(s) Administered   Fluad Quad(high Dose 65+) 08/09/2022   Influenza Split 08/29/2013   Influenza,inj,Quad PF,6+ Mos 08/29/2014, 08/13/2015, 06/30/2017, 07/27/2019   Influenza-Unspecified 08/30/2004, 10/28/2016, 10/06/2018, 08/14/2020   Moderna Sars-Covid-2 Vaccination 01/11/2020, 02/11/2020   PNEUMOCOCCAL CONJUGATE-20 01/05/2022   Pneumococcal Polysaccharide-23 07/16/2007, 04/02/2020   Td 08/13/2021    TDAP status: Up to date  Flu Vaccine status: Due, Education has been provided regarding the importance of this vaccine. Advised may receive this vaccine at local pharmacy or Health Dept. Aware to provide a copy of the vaccination record if obtained from local pharmacy or Health Dept. Verbalized acceptance and understanding.  Pneumococcal vaccine status: Up to  date  Covid-19 vaccine status: Information provided on how to obtain vaccines.   Qualifies for Shingles Vaccine? Yes   Zostavax completed No   Shingrix Completed?: No.    Education has been provided regarding the importance of this vaccine. Patient has been advised to call insurance company to determine out of pocket expense if they have not yet received this vaccine. Advised may also receive vaccine at local pharmacy or Health Dept. Verbalized acceptance and understanding.  Screening Tests Health Maintenance  Topic Date Due   Zoster Vaccines- Shingrix (1 of 2) Never done   COVID-19 Vaccine (3 - Moderna risk series) 03/10/2020   INFLUENZA VACCINE  01/29/2024 (Originally 06/01/2023)   Medicare Annual Wellness (AWV)  06/08/2024  Colonoscopy  03/18/2027   DTaP/Tdap/Td (2 - Tdap) 08/14/2031   Pneumonia Vaccine 62+ Years old  Completed   Hepatitis C Screening  Completed   HPV VACCINES  Aged Out    Health Maintenance  Health Maintenance Due  Topic Date Due   Zoster Vaccines- Shingrix (1 of 2) Never done   COVID-19 Vaccine (3 - Moderna risk series) 03/10/2020    Colorectal cancer screening: Type of screening: Colonoscopy. Completed 03/17/2020. Repeat every 7 years  Lung Cancer Screening: (Low Dose CT Chest recommended if Age 34-80 years, 20 pack-year currently smoking OR have quit w/in 15years.) does not qualify.   Lung Cancer Screening Referral: na  Additional Screening:  Hepatitis C Screening: does not qualify; Completed 07/26/2019  Vision Screening: Recommended annual ophthalmology exams for early detection of glaucoma and other disorders of the eye. Is the patient up to date with their annual eye exam?  Yes  Who is the provider or what is the name of the office in which the patient attends annual eye exams? [facility] If pt is not established with a provider, would they like to be referred to a provider to establish care? No .   Dental Screening: Recommended annual dental  exams for proper oral hygiene  Diabetic Foot Exam: na  Community Resource Referral / Chronic Care Management: CRR required this visit?  No   CCM required this visit?  No     Plan:     I have personally reviewed and noted the following in the patient's chart:   Medical and social history Use of alcohol, tobacco or illicit drugs  Current medications and supplements including opioid prescriptions. Patient is not currently taking opioid prescriptions. Functional ability and status Nutritional status Physical activity Advanced directives List of other physicians Hospitalizations, surgeries, and ER visits in previous 12 months Vitals Screenings to include cognitive, depression, and falls Referrals and appointments  In addition, I have reviewed and discussed with patient certain preventive protocols, quality metrics, and best practice recommendations. A written personalized care plan for preventive services as well as general preventive health recommendations were provided to patient.     Jordan Hawks Shervon Kerwin, CMA   07/07/2023   After Visit Summary: (MyChart) Due to this being a telephonic visit, the after visit summary with patients personalized plan was offered to patient via MyChart   Nurse Notes:

## 2023-07-10 NOTE — Telephone Encounter (Addendum)
Bobbye Morton, CMA  Duncan Dull, Kreg Shropshire, Efraim Kaufmann; [place], [place] New orders have been placed for the above pt, DOB:Dec 06, 2050 Thanks  New, Maryruth Bun, Abbe Amsterdam, CMA; Macon Large; Otilio Saber; 1 other Received, thank you!

## 2023-07-12 DIAGNOSIS — Z7901 Long term (current) use of anticoagulants: Secondary | ICD-10-CM | POA: Diagnosis not present

## 2023-07-12 DIAGNOSIS — I480 Paroxysmal atrial fibrillation: Secondary | ICD-10-CM | POA: Diagnosis not present

## 2023-07-12 DIAGNOSIS — Z01810 Encounter for preprocedural cardiovascular examination: Secondary | ICD-10-CM | POA: Diagnosis not present

## 2023-07-12 DIAGNOSIS — I255 Ischemic cardiomyopathy: Secondary | ICD-10-CM | POA: Diagnosis not present

## 2023-07-17 ENCOUNTER — Ambulatory Visit: Payer: Medicare Other | Admitting: Family Medicine

## 2023-07-17 DIAGNOSIS — N1832 Chronic kidney disease, stage 3b: Secondary | ICD-10-CM | POA: Diagnosis not present

## 2023-07-17 DIAGNOSIS — E782 Mixed hyperlipidemia: Secondary | ICD-10-CM | POA: Diagnosis not present

## 2023-07-17 DIAGNOSIS — I10 Essential (primary) hypertension: Secondary | ICD-10-CM | POA: Diagnosis not present

## 2023-07-17 DIAGNOSIS — R21 Rash and other nonspecific skin eruption: Secondary | ICD-10-CM | POA: Diagnosis not present

## 2023-07-17 DIAGNOSIS — N2889 Other specified disorders of kidney and ureter: Secondary | ICD-10-CM | POA: Diagnosis not present

## 2023-07-18 ENCOUNTER — Ambulatory Visit: Payer: Medicare Other | Admitting: Family Medicine

## 2023-07-18 DIAGNOSIS — Z981 Arthrodesis status: Secondary | ICD-10-CM | POA: Diagnosis not present

## 2023-07-18 DIAGNOSIS — D649 Anemia, unspecified: Secondary | ICD-10-CM | POA: Diagnosis not present

## 2023-07-18 DIAGNOSIS — Z01818 Encounter for other preprocedural examination: Secondary | ICD-10-CM | POA: Diagnosis not present

## 2023-07-18 DIAGNOSIS — Z006 Encounter for examination for normal comparison and control in clinical research program: Secondary | ICD-10-CM | POA: Diagnosis not present

## 2023-07-18 DIAGNOSIS — I255 Ischemic cardiomyopathy: Secondary | ICD-10-CM | POA: Diagnosis not present

## 2023-07-18 DIAGNOSIS — Z882 Allergy status to sulfonamides status: Secondary | ICD-10-CM | POA: Diagnosis not present

## 2023-07-18 DIAGNOSIS — J9811 Atelectasis: Secondary | ICD-10-CM | POA: Diagnosis not present

## 2023-07-18 DIAGNOSIS — Z880 Allergy status to penicillin: Secondary | ICD-10-CM | POA: Diagnosis not present

## 2023-07-18 DIAGNOSIS — Z91041 Radiographic dye allergy status: Secondary | ICD-10-CM | POA: Diagnosis not present

## 2023-07-18 DIAGNOSIS — Z7983 Long term (current) use of bisphosphonates: Secondary | ICD-10-CM | POA: Diagnosis not present

## 2023-07-18 DIAGNOSIS — I480 Paroxysmal atrial fibrillation: Secondary | ICD-10-CM | POA: Diagnosis not present

## 2023-07-18 DIAGNOSIS — Z87891 Personal history of nicotine dependence: Secondary | ICD-10-CM | POA: Diagnosis not present

## 2023-07-18 DIAGNOSIS — E039 Hypothyroidism, unspecified: Secondary | ICD-10-CM | POA: Diagnosis not present

## 2023-07-18 DIAGNOSIS — I130 Hypertensive heart and chronic kidney disease with heart failure and stage 1 through stage 4 chronic kidney disease, or unspecified chronic kidney disease: Secondary | ICD-10-CM | POA: Diagnosis not present

## 2023-07-18 DIAGNOSIS — I4811 Longstanding persistent atrial fibrillation: Secondary | ICD-10-CM | POA: Diagnosis not present

## 2023-07-18 DIAGNOSIS — N189 Chronic kidney disease, unspecified: Secondary | ICD-10-CM | POA: Diagnosis not present

## 2023-07-18 DIAGNOSIS — Z862 Personal history of diseases of the blood and blood-forming organs and certain disorders involving the immune mechanism: Secondary | ICD-10-CM | POA: Diagnosis not present

## 2023-07-18 DIAGNOSIS — Z86718 Personal history of other venous thrombosis and embolism: Secondary | ICD-10-CM | POA: Diagnosis not present

## 2023-07-18 DIAGNOSIS — Z955 Presence of coronary angioplasty implant and graft: Secondary | ICD-10-CM | POA: Diagnosis not present

## 2023-07-18 DIAGNOSIS — I2510 Atherosclerotic heart disease of native coronary artery without angina pectoris: Secondary | ICD-10-CM | POA: Diagnosis not present

## 2023-07-18 DIAGNOSIS — Z7901 Long term (current) use of anticoagulants: Secondary | ICD-10-CM | POA: Diagnosis not present

## 2023-07-18 DIAGNOSIS — I252 Old myocardial infarction: Secondary | ICD-10-CM | POA: Diagnosis not present

## 2023-07-18 DIAGNOSIS — I5032 Chronic diastolic (congestive) heart failure: Secondary | ICD-10-CM | POA: Diagnosis not present

## 2023-07-18 DIAGNOSIS — I10 Essential (primary) hypertension: Secondary | ICD-10-CM | POA: Diagnosis not present

## 2023-07-18 DIAGNOSIS — D693 Immune thrombocytopenic purpura: Secondary | ICD-10-CM | POA: Diagnosis not present

## 2023-07-18 DIAGNOSIS — Z79899 Other long term (current) drug therapy: Secondary | ICD-10-CM | POA: Diagnosis not present

## 2023-07-18 DIAGNOSIS — E785 Hyperlipidemia, unspecified: Secondary | ICD-10-CM | POA: Diagnosis not present

## 2023-07-18 DIAGNOSIS — Z951 Presence of aortocoronary bypass graft: Secondary | ICD-10-CM | POA: Diagnosis not present

## 2023-07-18 DIAGNOSIS — Z7984 Long term (current) use of oral hypoglycemic drugs: Secondary | ICD-10-CM | POA: Diagnosis not present

## 2023-07-21 DIAGNOSIS — I4811 Longstanding persistent atrial fibrillation: Secondary | ICD-10-CM | POA: Diagnosis not present

## 2023-07-21 DIAGNOSIS — Z006 Encounter for examination for normal comparison and control in clinical research program: Secondary | ICD-10-CM | POA: Diagnosis not present

## 2023-07-22 DIAGNOSIS — I480 Paroxysmal atrial fibrillation: Secondary | ICD-10-CM | POA: Diagnosis not present

## 2023-07-22 DIAGNOSIS — J9811 Atelectasis: Secondary | ICD-10-CM | POA: Diagnosis not present

## 2023-07-27 ENCOUNTER — Other Ambulatory Visit: Payer: Self-pay | Admitting: Family Medicine

## 2023-07-28 ENCOUNTER — Telehealth: Payer: Self-pay

## 2023-07-28 DIAGNOSIS — N319 Neuromuscular dysfunction of bladder, unspecified: Secondary | ICD-10-CM

## 2023-07-28 MED ORDER — EZETIMIBE 10 MG PO TABS: ORAL_TABLET | ORAL | 0 refills | Status: DC

## 2023-07-28 NOTE — Telephone Encounter (Signed)
Script sent to pharmacy.

## 2023-07-28 NOTE — Telephone Encounter (Signed)
Prescription Request  07/28/2023  LOV: Visit date not found  What is the name of the medication or equipment? ezetimibe (ZETIA) 10 MG tablet   Have you contacted your pharmacy to request a refill? Yes   Which pharmacy would you like this sent to?  [id number] - [place] [employer], [place] - [address] [place] [address] Phone: [phone] Fax: [phone]    Patient notified that their request is being sent to the clinical staff for review and that they should receive a response within 2 business days.   Please advise at Mobile [phone] (mobile)

## 2023-08-04 ENCOUNTER — Ambulatory Visit (INDEPENDENT_AMBULATORY_CARE_PROVIDER_SITE_OTHER): Payer: Medicare Other | Admitting: Family Medicine

## 2023-08-04 VITALS — BP 125/76 | HR 59 | Temp 98.1°F | Ht 73.0 in | Wt 260.2 lb

## 2023-08-04 DIAGNOSIS — E538 Deficiency of other specified B group vitamins: Secondary | ICD-10-CM | POA: Diagnosis not present

## 2023-08-04 DIAGNOSIS — Z23 Encounter for immunization: Secondary | ICD-10-CM

## 2023-08-04 DIAGNOSIS — E7849 Other hyperlipidemia: Secondary | ICD-10-CM

## 2023-08-04 DIAGNOSIS — I10 Essential (primary) hypertension: Secondary | ICD-10-CM

## 2023-08-04 MED ORDER — CYANOCOBALAMIN 1000 MCG/ML IJ SOLN
1000.0000 ug | Freq: Once | INTRAMUSCULAR | Status: AC
Start: 2023-08-04 — End: 2023-08-04
  Administered 2023-08-04: 1000 ug via INTRAMUSCULAR

## 2023-08-04 NOTE — Progress Notes (Unsigned)
Subjective:    Patient ID: [name], male    DOB: 11-27-1950, 71 y.o.   MRN: [id number]  HPI  We had discussion today regarding recent visits he has had with the nephrologist heart doctor He states he is doing fairly well overall but has a lot of neuropathic pain He is on multiple medicines These were reviewed today He states his breathing is doing okay His energy level is fair Strength is subpar He does use a walker to get around  Review of Systems     Objective:   Physical Exam  General-in no acute distress Eyes-no discharge Lungs-respiratory rate normal, CTA CV-no murmurs,RRR Extremities skin warm dry mild lower leg edema Neuro grossly normal Behavior normal, alert       Assessment & Plan:   1. Immunization due Today - Flu Vaccine Trivalent High Dose (Fluad)  2. Vitamin B12 deficiency Monthly - cyanocobalamin (VITAMIN B12) injection 1,000 mcg  3. Essential hypertension Blood pressure decent control  4. Other hyperlipidemia Continue statin  Follow through with seeing cardiology as planned

## 2023-08-07 ENCOUNTER — Ambulatory Visit: Payer: Self-pay | Admitting: *Deleted

## 2023-08-07 DIAGNOSIS — Z95818 Presence of other cardiac implants and grafts: Secondary | ICD-10-CM | POA: Insufficient documentation

## 2023-08-07 NOTE — Patient Outreach (Addendum)
Care Coordination  Outreach Note  08/07/2023 Name: [name] MRN: [id number] DOB: [birth year]   Care Coordination Outreach Attempts: An unsuccessful telephone outreach was attempted for a scheduled appointment today.  Follow Up Plan:  Additional outreach attempts will be made to offer the patient care coordination information and services.   Encounter Outcome:  No Answer  Tywanda Rice L. Noelle Penner, RN, BSN, [facility]  VBCI Care Management Coordinator  [phone]  Fax: [phone]

## 2023-08-07 NOTE — Patient Instructions (Addendum)
Visit Information  Thank you for taking time to visit with me today. Please don't hesitate to contact me if I can be of assistance to you.   Following are the goals we discussed today:   Goals Addressed             This Visit's Progress    Home management of hypertension- care coordination services   On track    Interventions Today    Flowsheet Row Most Recent Value  Chronic Disease   Chronic disease during today's visit Hypertension (HTN), Other  General Interventions   General Interventions Discussed/Reviewed General Interventions Discussed, Doctor Visits, Vaccines  Vaccines Flu  Doctor Visits Discussed/Reviewed Doctor Visits Discussed, PCP  PCP/Specialist Visits Compliance with follow-up visit  Exercise Interventions   Exercise Discussed/Reviewed Exercise Discussed, Physical Activity  Physical Activity Discussed/Reviewed Physical Activity Discussed  Education Interventions   Education Provided Provided Education  Provided Verbal Education On Community Resources  Pharmacy Interventions   Pharmacy Dicussed/Reviewed Pharmacy Topics Discussed, Medications and their functions, Affording Medications              Our next appointment is by telephone on 10/07/23 at 1 pm  Please call the care guide team at [phone] if you need to cancel or reschedule your appointment.   If you are experiencing a Mental Health or Behavioral Health Crisis or need someone to talk to, please call the Suicide and Crisis Lifeline: 988 call the [place] National Suicide Prevention Lifeline: 207 387 7196 or TTY: (418) 364-6219 TTY (628)659-9571) to talk to a trained counselor call 1-800-273-TALK (toll free, 24 hour hotline) call the [facility]: [phone] call 911   Patient verbalizes understanding of instructions and care plan provided today and agrees to view in MyChart. Active MyChart status and patient understanding of how to access instructions and care plan via MyChart  confirmed with patient.     The patient has been provided with contact information for the care management team and has been advised to call with any health related questions or concerns.    Yahshua Thibault L. Noelle Penner, RN, BSN, [facility]  VBCI Care Management Coordinator  [phone]  Fax: [phone]

## 2023-08-07 NOTE — Patient Outreach (Signed)
Care Coordination   Initial Visit Note   08/07/2023 Name: [name] MRN: [id number] DOB: 11/24/1950  [name] is a 72 y.o. year old male who sees [name], Jonna Coup, MD for primary care. I spoke with [name], wife of  [name] by phone today.  What matters to the patients health and wellness today?  Initial, hypertension, home care needs  Mrs [name] reports she and [name] are doing well. With assessment she denies any concerns    Goals Addressed             This Visit's Progress    Home management of hypertension- care coordination services   On track    Interventions Today    Flowsheet Row Most Recent Value  Chronic Disease   Chronic disease during today's visit Hypertension (HTN), Other  General Interventions   General Interventions Discussed/Reviewed General Interventions Discussed, Doctor Visits, Vaccines  Vaccines Flu  Doctor Visits Discussed/Reviewed Doctor Visits Discussed, PCP  PCP/Specialist Visits Compliance with follow-up visit  Exercise Interventions   Exercise Discussed/Reviewed Exercise Discussed, Physical Activity  Physical Activity Discussed/Reviewed Physical Activity Discussed  Education Interventions   Education Provided Provided Education  Provided Verbal Education On Community Resources  Pharmacy Interventions   Pharmacy Dicussed/Reviewed Pharmacy Topics Discussed, Medications and their functions, Affording Medications              SDOH assessments and interventions completed:  Yes  SDOH Interventions Today    Flowsheet Row Most Recent Value  SDOH Interventions   Food Insecurity Interventions Intervention Not Indicated  Housing Interventions Intervention Not Indicated  Transportation Interventions Intervention Not Indicated  Financial Strain Interventions Intervention Not Indicated        Care Coordination Interventions:  Yes, provided   Follow up plan: Follow up call scheduled for 10/06/23    Encounter Outcome:   Patient Visit Completed   Cala Bradford L. Noelle Penner, RN, BSN, [facility]  VBC[facility] Care Management Coordinator  [phone]  Fax: [phone]

## 2023-08-24 ENCOUNTER — Ambulatory Visit
Admission: EM | Admit: 2023-08-24 | Discharge: 2023-08-24 | Disposition: A | Discharge status: Home / Self Care | Payer: Medicare Other | Attending: Family Medicine | Admitting: Family Medicine

## 2023-08-24 DIAGNOSIS — Z20828 Contact with and (suspected) exposure to other viral communicable diseases: Secondary | ICD-10-CM

## 2023-08-24 DIAGNOSIS — J069 Acute upper respiratory infection, unspecified: Secondary | ICD-10-CM

## 2023-08-24 LAB — POCT INFLUENZA A/B
Influenza A, POC: NEGATIVE
Influenza B, POC: NEGATIVE

## 2023-08-24 MED ORDER — FLUTICASONE PROPIONATE 50 MCG/ACT NA SUSP: 1.0000 | Freq: Two times a day (BID) | NASAL | 2 refills | Status: AC

## 2023-08-24 MED ORDER — OSELTAMIVIR PHOSPHATE 75 MG PO CAPS: 75.0000 mg | ORAL_CAPSULE | Freq: Two times a day (BID) | ORAL | 0 refills | Status: DC

## 2023-08-24 MED ORDER — BENZONATATE 100 MG PO CAPS: 100.0000 mg | ORAL_CAPSULE | Freq: Three times a day (TID) | ORAL | 0 refills | Status: DC

## 2023-08-24 NOTE — Discharge Instructions (Signed)
Your flu test was negative today here but given your symptoms and close exposure, will go ahead and start the antiviral medication Tamiflu to treat influenza.  I have also sent over some medications to help with your symptoms and you may take Coricidin HBP, plain Mucinex, Tylenol as needed.  Follow-up for worsening symptoms.

## 2023-08-24 NOTE — ED Provider Notes (Signed)
[facility]    CSN: [id number] Arrival date & time: 08/24/23  1255      History   Chief Complaint No chief complaint on file.   HPI [name] is a 72 y.o. male.   Presenting today with 1 day of sore throat, fever, cough, congestion, fatigue, body aches.  Denies chest pain, shortness of breath, abdominal pain, nausea vomiting or diarrhea.  Recent close exposure to influenza B.  So far not trying anything over-the-counter for symptoms.    Past Medical History:  Diagnosis Date   Allergy    Carpal tunnel syndrome    Chronic back pain    Chronic combined systolic and diastolic congestive heart failure (HCC)    Chronic ITP (idiopathic thrombocytopenia) (HCC) 05/25/2015   Coronary artery disease    Coronary artery disease involving native coronary artery of native heart with angina pectoris (HCC)    Degenerative disc disease    Epidural hematoma (HCC)    C3-4 ACDF 07/19/21, presented 07/22/21 with BLE/UE weakness/epidural hematoma, s/p revision & hematoma evacuation   GERD (gastroesophageal reflux disease)    History of hiatal hernia    History of thrombocytopenia    Hypercholesteremia    Hypertension    Hypothyroid    Lumbar pain    Myocardial infarction (HCC) 2009   Obesity    Pneumonia    around age 70   S/P CABG x 3 04/27/2018   LIMA to LAD SVG to OM1 SVG to OM2   Shortness of breath dyspnea     Patient Active Problem List   Diagnosis Date Noted   B12 deficiency 04/28/2023   AKI (acute kidney injury) (HCC) 04/27/2023   Falls frequently 04/27/2023   Heart failure with recovered ejection fraction (HFrecEF) (HCC) 04/27/2023   Myelopathy concurrent with and due to spinal stenosis of cervical region (HCC) 04/01/2022   Spasticity 12/10/2021   Foot drop, bilateral 12/10/2021   Wheelchair dependence 12/10/2021   Nerve pain 12/10/2021   Cervical myelopathy (HCC)    Hyponatremia    Neurogenic bladder    Pressure injury of skin 08/08/2021   Myelopathy  (HCC) 07/19/2021   Callus 05/18/2021   Acute bacterial rhinosinusitis 10/05/2020   Cough in adult 10/05/2020   S/P CABG x 3 04/27/2018   Angina pectoris (HCC) 04/24/2018   Obesity    Chronic combined systolic and diastolic congestive heart failure (HCC)    Peripheral edema 06/30/2017   Pedal edema 11/28/2016   Osteopenia 08/13/2015   Thrombocytopenia (HCC) 05/25/2015   Chronic ITP (idiopathic thrombocytopenia) (HCC) 05/25/2015   Hypothyroidism 08/29/2014   Diverticulitis of colon (without mention of hemorrhage)(562.11) 02/04/2014   Dysphagia 02/04/2014   Hyperlipidemia 11/18/2013   Dyspnea 01/02/2012   Palpitations 01/02/2012   Mixed hyperlipidemia 07/06/2011   Coronary artery disease involving native coronary artery of native heart with angina pectoris (HCC)    TOTAL KNEE FOLLOW-UP 08/14/2008   Chronic back pain 07/23/2008   KNEE, ARTHRITIS, DEGEN./OSTEO 05/29/2008   JOINT EFFUSION, KNEE 04/30/2008   Pain in joint, lower leg 10/10/2007   Essential hypertension 10/09/2007    Past Surgical History:  Procedure Laterality Date   ANTERIOR CERVICAL DECOMP/DISCECTOMY FUSION N/A 07/19/2021   Procedure: CERVICAL THREE-FOUR ANTERIOR CERVICAL DECOMPRESSION/DISCECTOMY FUSION;  Surgeon: Donalee Citrin, MD;  Location: [facility] OR;  Service: Neurosurgery;  Laterality: N/A;   ANTERIOR CERVICAL DECOMP/DISCECTOMY FUSION N/A 07/22/2021   Procedure: ANTERIOR CERVICAL DECOMPRESSION/DISCECTOMY REVISON FUSION  WITH EVACUATION OF EPIDURAL HEMATOMA;  Surgeon: Bethann Goo, DO;  Location:  [facility] OR;  Service: Neurosurgery;  Laterality: N/A;   BACK SURGERY     5 lumbas disc with cervical and lumbar fusions   BIOPSY N/A 03/06/2014   Procedure: ESOPHAGEAL BIOPSIES;  Surgeon: Malissa Hippo, MD;  Location: [facility] ORS;  Service: Endoscopy;  Laterality: N/A;   COLONOSCOPY     COLONOSCOPY WITH PROPOFOL N/A 03/06/2014   Procedure: COLONOSCOPY WITH PROPOFOL;  Surgeon: Malissa Hippo, MD;  Location: [facility]S;  Service:  Endoscopy;  Laterality: N/A;  in cecum at 0807; total withdrawal time 9 minutes   CORONARY ANGIOPLASTY WITH STENT PLACEMENT     2000, and 2004 has 3 stents   CORONARY ARTERY BYPASS GRAFT N/A 04/27/2018   Procedure: CORONARY ARTERY BYPASS GRAFTING (CABG) x Three , using left internal mammary artery and right leg greater saphenous vein;  Surgeon: Purcell Nails, MD;  Location: [facility] OR;  Service: Open Heart Surgery;  Laterality: N/A;   ESOPHAGOGASTRODUODENOSCOPY (EGD) WITH PROPOFOL N/A 03/06/2014   Procedure: ESOPHAGOGASTRODUODENOSCOPY (EGD) WITH PROPOFOL;  Surgeon: Malissa Hippo, MD;  Location: [facility];  Service: Endoscopy;  Laterality: N/A;   EYE SURGERY Right 2013   "lazy eye"   JOINT REPLACEMENT Left 2011   LEFT HEART CATH AND CORONARY ANGIOGRAPHY N/A 04/24/2018   Procedure: LEFT HEART CATH AND CORONARY ANGIOGRAPHY;  Surgeon: Corky Crafts, MD;  Location: [facility] INVASIVE CV LAB;  Service: Cardiovascular;  Laterality: N/A;   LUMBAR FUSION  11/01/2007   MALONEY DILATION N/A 03/06/2014   Procedure: MALONEY DILATION 54 french;  Surgeon: Malissa Hippo, MD;  Location: [facility];  Service: Endoscopy;  Laterality: N/A;   NECK SURGERY     POSTERIOR CERVICAL FUSION/FORAMINOTOMY N/A 04/01/2022   Procedure: Post Cervical Lami/Multi level - C3-C4 - C4-C5 and fusion with lateral mass screws and posterolateral arthrodesis;  Surgeon: Donalee Citrin, MD;  Location: [facility] OR;  Service: Neurosurgery;  Laterality: N/A;   TEE WITHOUT CARDIOVERSION N/A 04/27/2018   Procedure: TRANSESOPHAGEAL ECHOCARDIOGRAM (TEE);  Surgeon: Purcell Nails, MD;  Location: [facility] OR;  Service: Open Heart Surgery;  Laterality: N/A;       Home Medications    Prior to Admission medications   Medication Sig Start Date End Date Taking? Authorizing Provider  benzonatate (TESSALON) 100 MG capsule Take 1 capsule (100 mg total) by mouth every 8 (eight) hours. 08/24/23  Yes Particia Nearing, PA-C  fluticasone [facility]) 50 MCG/ACT nasal  spray Place 1 spray into both nostrils 2 (two) times daily. 08/24/23  Yes Particia Nearing, PA-C  oseltamivir (TAMIFLU) 75 MG capsule Take 1 capsule (75 mg total) by mouth every 12 (twelve) hours. 08/24/23  Yes Particia Nearing, PA-C  acetaminophen (TYLENOL) 325 MG tablet Take 2 tablets (650 mg total) by mouth every 6 (six) hours as needed for mild pain (or Fever >/= 101). Patient taking differently: Take 1,000 mg by mouth every 6 (six) hours as needed for mild pain (or Fever >/= 101). 08/23/21   Angiulli, Mcarthur Rossetti, PA-C  alendronate (FOSAMAX) 70 MG tablet TAKE 1 TABLET(70 MG) BY MOUTH EVERY 7 DAYS WITH A FULL GLASS OF WATER AND ON AN EMPTY STOMACH 06/06/23   Babs Sciara, MD  amiodarone (PACERONE) 200 MG tablet daily. 05/03/23   [provider]  apixaban (ELIQUIS) 5 MG TABS tablet Take by mouth 2 (two) times daily. 05/03/23   [provider]  ascorbic acid (VITAMIN C) 500 MG tablet Take by mouth daily.    [provider]  baclofen (  LIORESAL) 10 MG tablet TAKE 1 TABLET BY MOUTH THREE TIMES DAILY AS NEEDED FOR SPASMS 05/25/23   Babs Sciara, MD  cetirizine (ZYRTEC) 10 MG tablet Take by mouth.    [provider]  cholecalciferol (VITAMIN D3) 25 MCG (1000 UNIT) tablet Take 1,000 Units by mouth daily.    [provider]  empagliflozin (JARDIANCE) 10 MG TABS tablet Take 1 tablet by mouth daily. 05/04/23 05/03/24  [provider]  ezetimibe (ZETIA) 10 MG tablet TAKE 1 TABLET(10 MG) BY MOUTH DAILY 07/28/23   Babs Sciara, MD  gabapentin (NEURONTIN) 100 MG capsule 2 pills each evening 06/15/23   Babs Sciara, MD  levothyroxine (SYNTHROID) 100 MCG tablet Take by mouth. 05/04/23 05/03/24  [provider]  lisinopril (ZESTRIL) 20 MG tablet Take 1 tablet (20 mg total) by mouth daily. Patient taking differently: Take 10 mg by mouth daily. 03/02/23   Babs Sciara, MD  metoprolol succinate (TOPROL-XL) 25 MG 24 hr tablet Take by mouth. 05/03/23  05/02/24  [provider]  Multiple Vitamin (MULTIVITAMIN) capsule Take 1 capsule by mouth daily.    [provider]  nitroGLYCERIN (NITROSTAT) 0.4 MG SL tablet PLACE 1 TABLET UNDER THE TONGUE EVERY 5 MINUTES AS NEEDED FOR CHEST PAINS. Strength: 0.4 mg 08/25/22   Wendall Stade, MD  pantoprazole (PROTONIX) 40 MG tablet TAKE 1 TABLET(40 MG) BY MOUTH DAILY 07/27/23   Babs Sciara, MD  polycarbophil (FIBERCON) 625 MG tablet Take 1 tablet (625 mg total) by mouth daily. 08/23/21   Angiulli, Mcarthur Rossetti, PA-C  potassium chloride (KLOR-CON M) 10 MEQ tablet TAKE 1 TABLET(10 MEQ) BY MOUTH DAILY 06/23/23   Babs Sciara, MD  rosuvastatin (CRESTOR) 40 MG tablet TAKE 1 TABLET BY MOUTH EVERY EVENING 05/24/23   Babs Sciara, MD  sertraline (ZOLOFT) 100 MG tablet TAKE ONE TABLET BY MOUTH ONCE A DAY 02/01/23   Babs Sciara, MD  tamsulosin (FLOMAX) 0.4 MG CAPS capsule TAKE 2 CAPSULES BY MOUTH AT NIGHT 06/23/23   Babs Sciara, MD  torsemide (DEMADEX) 20 MG tablet 1 to 2 each morning as needed Patient taking differently: 1/2 tab midday 03/02/23   Babs Sciara, MD    Family History Family History  Problem Relation Age of Onset   Heart attack Father    Heart disease Father    Colon cancer Maternal Grandmother    Esophageal cancer Neg Hx    Rectal cancer Neg Hx    Stomach cancer Neg Hx    Sleep apnea Neg Hx     Social History Social History   Tobacco Use   Smoking status: Former    Current packs/day: 0.00    Average packs/day: 2.0 packs/day for 20.0 years (40.0 ttl pk-yrs)    Types: Cigarettes    Start date: 07/06/1971    Quit date: 07/06/1991    Years since quitting: 32.1   Smokeless tobacco: Never  Vaping Use   Vaping status: Never Used  Substance Use Topics   Alcohol use: Yes    Comment: occassional   Drug use: No     Allergies   Penicillins, Sulfa antibiotics, Iodinated contrast media, and Zoloft [sertraline hcl]   Review of Systems Review of Systems Per  HPI  Physical Exam Triage Vital Signs ED Triage Vitals  Encounter Vitals Group     BP 08/24/23 1358 101/65     Systolic BP Percentile --      Diastolic BP Percentile --  Pulse Rate 08/24/23 1358 70     Resp 08/24/23 1358 18     Temp 08/24/23 1358 98.4 F (36.9 C)     Temp Source 08/24/23 1358 Oral     SpO2 08/24/23 1358 92 %     Weight --      Height --      Head Circumference --      Peak Flow --      Pain Score 08/24/23 1400 5     Pain Loc --      Pain Education --      Exclude from Growth Chart --    No data found.  Updated Vital Signs BP 101/65 (BP Location: Right Arm)   Pulse 70   Temp 98.4 F (36.9 C) (Oral)   Resp 18   SpO2 92%   Visual Acuity Right Eye Distance:   Left Eye Distance:   Bilateral Distance:    Right Eye Near:   Left Eye Near:    Bilateral Near:     Physical Exam Vitals and nursing note reviewed.  Constitutional:      Appearance: He is well-developed.  HENT:     Head: Atraumatic.     Right Ear: External ear normal.     Left Ear: External ear normal.     Nose: Rhinorrhea present.     Mouth/Throat:     Pharynx: No oropharyngeal exudate or posterior oropharyngeal erythema.  Eyes:     Conjunctiva/sclera: Conjunctivae normal.     Pupils: Pupils are equal, round, and reactive to light.  Cardiovascular:     Rate and Rhythm: Normal rate and regular rhythm.  Pulmonary:     Effort: Pulmonary effort is normal. No respiratory distress.     Breath sounds: No wheezing or rales.  Musculoskeletal:        General: Normal range of motion.     Cervical back: Normal range of motion and neck supple.  Lymphadenopathy:     Cervical: No cervical adenopathy.  Skin:    General: Skin is warm and dry.  Neurological:     Mental Status: He is alert and oriented to person, place, and time.     Motor: No weakness.     Gait: Gait normal.  Psychiatric:        Behavior: Behavior normal.      UC Treatments / Results  Labs (all labs ordered are  listed, but only abnormal results are displayed) Labs Reviewed  POCT INFLUENZA A/B    EKG   Radiology No results found.  Procedures Procedures (including critical care time)  Medications Ordered in UC Medications - No data to display  Initial Impression / Assessment and Plan / UC Course  I have reviewed the triage vital signs and the nursing notes.  Pertinent labs & imaging results that were available during my care of the patient were reviewed by me and considered in my medical decision making (see chart for details).     Vital signs and exam overall reassuring today, consistent with a viral respiratory infection.  Rapid flu testing was negative today in clinic however given symptoms and close exposure to influenza B will cover with Tamiflu, Flonase, Tessalon and supportive over-the-counter medications and home care.  Return for worsening symptoms.  Final Clinical Impressions(s) / UC Diagnoses   Final diagnoses:  Viral URI with cough  Exposure to influenza     Discharge Instructions      Your flu test was negative today here but given  your symptoms and close exposure, will go ahead and start the antiviral medication Tamiflu to treat influenza.  I have also sent over some medications to help with your symptoms and you may take Coricidin HBP, plain Mucinex, Tylenol as needed.  Follow-up for worsening symptoms.    ED Prescriptions     Medication Sig Dispense Auth. Provider   oseltamivir (TAMIFLU) 75 MG capsule Take 1 capsule (75 mg total) by mouth every 12 (twelve) hours. 10 capsule Particia Nearing, PA-C   fluticasone [facility]) 50 MCG/ACT nasal spray Place 1 spray into both nostrils 2 (two) times daily. 16 g Particia Nearing, PA-C   benzonatate (TESSALON) 100 MG capsule Take 1 capsule (100 mg total) by mouth every 8 (eight) hours. 21 capsule Particia Nearing, [place]      PDMP not reviewed this encounter.   Particia Nearing, [place] 08/24/23  1521

## 2023-08-24 NOTE — ED Triage Notes (Signed)
Pt reports he has been exposed to flu b x 1 day and now he has a sore throat, fever, and is  lathargic.

## 2023-08-25 ENCOUNTER — Other Ambulatory Visit: Payer: Self-pay | Admitting: Family Medicine

## 2023-08-25 DIAGNOSIS — R252 Cramp and spasm: Secondary | ICD-10-CM

## 2023-08-31 ENCOUNTER — Ambulatory Visit: Payer: Medicare Other | Admitting: Family Medicine

## 2023-08-31 ENCOUNTER — Ambulatory Visit (HOSPITAL_COMMUNITY)
Admission: RE | Admit: 2023-08-31 | Discharge: 2023-08-31 | Disposition: A | Discharge status: Home / Self Care | Payer: Medicare Other | Source: Ambulatory Visit | Attending: Family Medicine | Admitting: Family Medicine

## 2023-08-31 VITALS — BP 160/85 | HR 59 | Temp 97.9°F | Ht 73.0 in | Wt 262.0 lb

## 2023-08-31 DIAGNOSIS — R059 Cough, unspecified: Secondary | ICD-10-CM | POA: Diagnosis not present

## 2023-08-31 DIAGNOSIS — R0602 Shortness of breath: Secondary | ICD-10-CM

## 2023-08-31 DIAGNOSIS — I700 Atherosclerosis of aorta: Secondary | ICD-10-CM | POA: Diagnosis not present

## 2023-08-31 DIAGNOSIS — I517 Cardiomegaly: Secondary | ICD-10-CM | POA: Diagnosis not present

## 2023-08-31 MED ORDER — DOXYCYCLINE HYCLATE 100 MG PO TABS: 100.0000 mg | ORAL_TABLET | Freq: Two times a day (BID) | ORAL | 0 refills | Status: DC

## 2023-08-31 NOTE — Patient Instructions (Signed)
Chest x-ray today.  Once the radiology reads, I will send in medication.  Take care  Dr. Adriana Simas

## 2023-09-01 DIAGNOSIS — R0602 Shortness of breath: Secondary | ICD-10-CM | POA: Insufficient documentation

## 2023-09-01 NOTE — Assessment & Plan Note (Signed)
Given patient's persistent symptoms and shortness of breath, chest x-ray was obtained.  Chest x-ray was independently reviewed by me.  Interpretation: Negative for pneumonia. Placing on doxycycline.

## 2023-09-01 NOTE — Progress Notes (Signed)
Subjective:  Patient ID: [name], male    DOB: 06/30/1951  Age: 72 y.o. MRN: [id number]  CC:   Chief Complaint  Patient presents with   follow up from flu    Completed tamiflu yesterday Still persists cough, low grade fever, runny nose, occ body aches    HPI:  72 year old male with an extensive past medical history including hypertension, coronary disease, heart failure presents for evaluation of the above.  Patient recently been sick.  Was seen in urgent care on 10/24.  He was treated empirically for influenza despite negative testing given exposure and symptomatology.  He has completed course of Tamiflu.  He continues to not feel well.  He had no additional fever but he has significant cough and congestion.  Also reports associated shortness of breath.  No relieving factors.  No other complaints.  Patient Active Problem List   Diagnosis Date Noted   SOB (shortness of breath) 09/01/2023   B12 deficiency 04/28/2023   Falls frequently 04/27/2023   Heart failure with recovered ejection fraction (HFrecEF) (HCC) 04/27/2023   Myelopathy concurrent with and due to spinal stenosis of cervical region (HCC) 04/01/2022   Spasticity 12/10/2021   Foot drop, bilateral 12/10/2021   Wheelchair dependence 12/10/2021   Nerve pain 12/10/2021   Cervical myelopathy (HCC)    Neurogenic bladder    S/P CABG x 3 04/27/2018   Obesity    Peripheral edema 06/30/2017   Thrombocytopenia (HCC) 05/25/2015   Chronic ITP (idiopathic thrombocytopenia) (HCC) 05/25/2015   Hypothyroidism 08/29/2014   Hyperlipidemia 11/18/2013   Mixed hyperlipidemia 07/06/2011   Coronary artery disease involving native coronary artery of native heart with angina pectoris (HCC)    Chronic back pain 07/23/2008   Essential hypertension 10/09/2007    Social Hx   Social History   Socioeconomic History   Marital status: Married    Spouse name: Not on file   Number of children: Not on file   Years of education: Not on  file   Highest education level: Not on file  Occupational History   Occupation: retireed  Tobacco Use   Smoking status: Former    Current packs/day: 0.00    Average packs/day: 2.0 packs/day for 20.0 years (40.0 ttl pk-yrs)    Types: Cigarettes    Start date: 07/06/1971    Quit date: 07/06/1991    Years since quitting: 32.1   Smokeless tobacco: Never  Vaping Use   Vaping status: Never Used  Substance and Sexual Activity   Alcohol use: Yes    Comment: occassional   Drug use: No   Sexual activity: Not Currently    Birth control/protection: None  Other Topics Concern   Not on file  Social History Narrative   Disabled due to chronic back pain   Social Determinants of Health   Financial Resource Strain: Low Risk  (08/07/2023)   Overall Financial Resource Strain (CARDIA)    Difficulty of Paying Living Expenses: Not hard at all  Food Insecurity: No Food Insecurity (08/07/2023)   Hunger Vital Sign    Worried About Running Out of Food in the Last Year: Never true    Ran Out of Food in the Last Year: Never true  Transportation Needs: No Transportation Needs (08/07/2023)   PRAPARE - Administrator, Civil Service (Medical): No    Lack of Transportation (Non-Medical): No  Physical Activity: Insufficiently Active (07/07/2023)   Exercise Vital Sign    Days of Exercise per Week: 3 days  Minutes of Exercise per Session: 20 min  Stress: Stress Concern Present (07/07/2023)   [employer] of Occupational Health - Occupational Stress Questionnaire    Feeling of Stress : Rather much  Social Connections: Moderately Integrated (07/07/2023)   Social Connection and Isolation Panel [NHANES]    Frequency of Communication with Friends and Family: More than three times a week    Frequency of Social Gatherings with Friends and Family: More than three times a week    Attends Religious Services: More than 4 times per year    Active Member of [employer] or Organizations: No    Attends Museum/gallery exhibitions officer: Never    Marital Status: Married    Review of Systems Per HPI  Objective:  BP (!) 160/85   Pulse (!) 59   Temp 97.9 F (36.6 C)   Ht 6\' 1"  (1.854 m)   Wt 262 lb (118.8 kg)   SpO2 96%   BMI 34.57 kg/m      08/31/2023    2:34 PM 08/31/2023    2:26 PM 08/24/2023    1:58 PM  BP/Weight  Systolic BP 160 156 101  Diastolic BP 85 79 65  Wt. (Lbs)  262   BMI  34.57 kg/m2     Physical Exam Vitals and nursing note reviewed.  Constitutional:      General: He is not in acute distress. HENT:     Head: Normocephalic and atraumatic.  Eyes:     General:        Right eye: No discharge.        Left eye: No discharge.     Conjunctiva/sclera: Conjunctivae normal.  Cardiovascular:     Rate and Rhythm: Regular rhythm. Bradycardia present.  Pulmonary:     Effort: Pulmonary effort is normal.     Breath sounds: Normal breath sounds. No wheezing or rales.  Neurological:     Mental Status: He is alert.  Psychiatric:        Mood and Affect: Mood normal.        Behavior: Behavior normal.     Lab Results  Component Value Date   WBC 5.4 03/03/2023   HGB 12.9 (L) 03/03/2023   HCT 39.1 03/03/2023   PLT 131 (L) 03/03/2023   GLUCOSE 89 03/03/2023   CHOL 169 03/03/2023   TRIG 92 03/03/2023   HDL 55 03/03/2023   LDLCALC 97 03/03/2023   ALT 15 03/03/2023   AST 16 03/03/2023   NA 142 03/03/2023   K 4.8 03/03/2023   CL 101 03/03/2023   CREATININE 1.03 03/03/2023   BUN 13 03/03/2023   CO2 27 03/03/2023   TSH 0.078 (L) 03/03/2023   PSA 0.47 07/05/2013   INR 1.0 07/22/2021   HGBA1C 5.5 03/03/2023     Assessment & Plan:   Problem List Items Addressed This Visit       Other   SOB (shortness of breath) - Primary    Given patient's persistent symptoms and shortness of breath, chest x-ray was obtained.  Chest x-ray was independently reviewed by me.  Interpretation: Negative for pneumonia. Placing on doxycycline.      Relevant Orders   DG Chest 2 View  (Completed)    Meds ordered this encounter  Medications   doxycycline (VIBRA-TABS) 100 MG tablet    Sig: Take 1 tablet (100 mg total) by mouth 2 (two) times daily. Take with food and water.    Dispense:  14 tablet    Refill:  0    Follow-up:  Return if symptoms worsen or fail to improve.  Everlene Other DO [facility]

## 2023-09-05 ENCOUNTER — Ambulatory Visit (INDEPENDENT_AMBULATORY_CARE_PROVIDER_SITE_OTHER): Payer: Medicare Other

## 2023-09-05 ENCOUNTER — Other Ambulatory Visit: Payer: Self-pay | Admitting: Family Medicine

## 2023-09-05 ENCOUNTER — Telehealth: Payer: Self-pay

## 2023-09-05 DIAGNOSIS — E559 Vitamin D deficiency, unspecified: Secondary | ICD-10-CM

## 2023-09-05 MED ORDER — METOPROLOL SUCCINATE ER 25 MG PO TB24: 25.0000 mg | ORAL_TABLET | Freq: Two times a day (BID) | ORAL | 1 refills | Status: DC

## 2023-09-05 MED ORDER — CYANOCOBALAMIN 1000 MCG/ML IJ SOLN
1000.0000 ug | Freq: Once | INTRAMUSCULAR | Status: AC
  Administered 2023-09-05: 1000 ug via INTRAMUSCULAR

## 2023-09-05 NOTE — Telephone Encounter (Signed)
What is recommended is metoprolol succinate 25 mg 1 twice daily #60 with 6 refills if he would prefer 90-day may have 90-day with 1 refill

## 2023-09-05 NOTE — Telephone Encounter (Signed)
Call clarify dosage and how he takes it with the patient Levothyroxine may have 30 or 90-day with 1 refill Lisinopril may have 30 or 90-day with 1 refill As for the metoprolol what he listed is actually a 24-hour metoprolol so typically that is only given once per day, please verify milligram and frequency  For [place] to prescribe this I would need to see him twice yearly  finally it is necessary for him to do follow-up office visit with myself every 6 months I would recommend a follow-up by the end of the year or early next year and when he comes in we will look over the lab work [facility] does and if he needs additional lab work we will order it

## 2023-09-05 NOTE — Telephone Encounter (Signed)
Pt is calling about levothyroxine (SYNTHROID) 100 MCG table taking once a day., Lisinopril 10mg  once a day the heart doctor cut him down on that one then he is taking   metoprolol succinate (TOPROL-XL) 25 MG 24 hr tablet  Take this twice daily once in the morning and once at night   These need to be taken over by Dr Lorin Picket pt needed refills and pharmacy was sending to specialist and they were being denied.   [facility] on [address]

## 2023-09-05 NOTE — Telephone Encounter (Signed)
Called and spoke with patient. He needs Levothyroxine 90 days and 1 refill. As well as the Lisinopril  90-day with 1 refill. The metoprolol is 25 mg twice daily would like 90 if possible.

## 2023-09-05 NOTE — Addendum Note (Signed)
Addended by: Elizbeth Squires on: 09/05/2023 03:39 PM   Modules accepted: Orders

## 2023-09-05 NOTE — Telephone Encounter (Signed)
Prescription sent in per Dr Lorin Picket  metoprolol succinate 25 mg 1 twice daily #60 with 6 refills if he would prefer 90-day may have 90-day with 1 refill

## 2023-09-10 ENCOUNTER — Other Ambulatory Visit: Payer: Self-pay | Admitting: Family Medicine

## 2023-09-13 DIAGNOSIS — I351 Nonrheumatic aortic (valve) insufficiency: Secondary | ICD-10-CM | POA: Diagnosis not present

## 2023-09-13 DIAGNOSIS — Z4509 Encounter for adjustment and management of other cardiac device: Secondary | ICD-10-CM | POA: Diagnosis not present

## 2023-09-13 DIAGNOSIS — Z95818 Presence of other cardiac implants and grafts: Secondary | ICD-10-CM | POA: Diagnosis not present

## 2023-09-13 DIAGNOSIS — I480 Paroxysmal atrial fibrillation: Secondary | ICD-10-CM | POA: Diagnosis not present

## 2023-09-15 ENCOUNTER — Encounter: Payer: Self-pay | Admitting: Family Medicine

## 2023-09-16 ENCOUNTER — Other Ambulatory Visit: Payer: Self-pay | Admitting: Family Medicine

## 2023-09-16 MED ORDER — GABAPENTIN 100 MG PO CAPS: ORAL_CAPSULE | ORAL | 5 refills | Status: DC

## 2023-09-18 ENCOUNTER — Other Ambulatory Visit: Payer: Self-pay | Admitting: Family Medicine

## 2023-09-25 ENCOUNTER — Telehealth: Payer: Self-pay

## 2023-09-25 ENCOUNTER — Other Ambulatory Visit: Payer: Self-pay | Admitting: Family Medicine

## 2023-09-25 DIAGNOSIS — N319 Neuromuscular dysfunction of bladder, unspecified: Secondary | ICD-10-CM

## 2023-09-25 NOTE — Telephone Encounter (Signed)
Called patient and LVM to check if he has been using his CPAP machine and if he has, could he bring his machine and power cord.

## 2023-09-26 ENCOUNTER — Encounter: Payer: Medicare Other | Admitting: Adult Health

## 2023-09-26 ENCOUNTER — Encounter: Payer: Self-pay | Admitting: Adult Health

## 2023-09-27 ENCOUNTER — Other Ambulatory Visit: Payer: Self-pay | Admitting: Family Medicine

## 2023-10-05 ENCOUNTER — Ambulatory Visit: Payer: Medicare Other

## 2023-10-05 DIAGNOSIS — E538 Deficiency of other specified B group vitamins: Secondary | ICD-10-CM

## 2023-10-05 MED ORDER — CYANOCOBALAMIN 1000 MCG/ML IJ SOLN
1000.0000 ug | Freq: Once | INTRAMUSCULAR | Status: AC
  Administered 2023-10-05: 1000 ug via INTRAMUSCULAR

## 2023-10-05 MED ORDER — CYANOCOBALAMIN 1000 MCG/ML IJ SOLN: 1000.0000 ug | Freq: Once | INTRAMUSCULAR | Status: AC

## 2023-10-06 ENCOUNTER — Ambulatory Visit: Payer: Self-pay | Admitting: *Deleted

## 2023-10-06 NOTE — Patient Instructions (Addendum)
Visit Information  Thank you for taking time to visit with me today. Please don't hesitate to contact me if I can be of assistance to you.   Following are the goals we discussed today:   Goals Addressed             This Visit's Progress    Home management of hypertension- care coordination services       Interventions Today    Flowsheet Row Most Recent Value  Chronic Disease   Chronic disease during today's visit Atrial Fibrillation (AFib), Other  [vit B 12, neurontin, exercise/warm muscles]  General Interventions   General Interventions Discussed/Reviewed General Interventions Reviewed, Labs, Doctor Visits  Labs --  [vit B 12]  Doctor Visits Discussed/Reviewed Doctor Visits Reviewed, PCP, Specialist  PCP/Specialist Visits Compliance with follow-up visit  Exercise Interventions   Exercise Discussed/Reviewed Exercise Reviewed, Physical Activity, Weight Managment  [encouraged activity]  Physical Activity Discussed/Reviewed Physical Activity Reviewed, Types of exercise  [encouraged warming muscles]  Weight Management Weight maintenance  Education Interventions   Education Provided Provided Education  Provided Verbal Education On Labs, Mental Health/Coping with Illness, Medication, Sick Day Rules  Mental Health Interventions   Mental Health Discussed/Reviewed Mental Health Discussed, Coping Strategies  Nutrition Interventions   Nutrition Discussed/Reviewed Nutrition Discussed, Portion sizes, Fluid intake  Pharmacy Interventions   Pharmacy Dicussed/Reviewed Pharmacy Topics Reviewed, Medications and their functions, Affording Medications  Safety Interventions   Safety Discussed/Reviewed Safety Discussed, Home Safety  Home Safety Assistive Devices              Our next appointment is by telephone on 11/10/23 at 2 pm  Please call the care guide team at [phone] if you need to cancel or reschedule your appointment.   If you are experiencing a Mental Health or Behavioral  Health Crisis or need someone to talk to, please call the Suicide and Crisis Lifeline: 988 call the [place] National Suicide Prevention Lifeline: (862) 821-3727 or TTY: 914-532-1780 TTY 585 583 7380) to talk to a trained counselor call 1-800-273-TALK (toll free, 24 hour hotline) call the [facility]: [phone] call 911   Patient verbalizes understanding of instructions and care plan provided today and agrees to view in MyChart. Active MyChart status and patient understanding of how to access instructions and care plan via MyChart confirmed with patient.     The patient has been provided with contact information for the care management team and has been advised to call with any health related questions or concerns.   Gerome Kokesh L. Noelle Penner, RN, BSN, CCM   [facility], [facility] RN Care Manager Direct Dial: [phone]  Fax: [phone] Mailing Address: [address]  [place] [address] Website: [email or web address]

## 2023-10-06 NOTE — Patient Outreach (Addendum)
  Care Coordination   Follow Up Visit Note   11/21/2023 updated note for 10/06/23 Name: [name] MRN: [id number] DOB: October 09, 1951  [name] is a 72 y.o. year old male who sees [name], Jonna Coup, MD for primary care. I spoke with  [name] by phone today.  What matters to the patients health and wellness today?  URI (upper respiratory infection), Watchman implant, hip pain  URI -feels better   Watchman FLX Pro device placed on 07/21/23 therefore his heart rate is good   Vitamin B12 level was decrease Continues to have muscle pain all times  Neurontin received  to help His hip pain level today is 6  He notices more pain when he participate in excess walking or standing  Encouraged to warm muscles  Denies issues with Neurontin, vit B12,  feel need to take extra Neurontin at times- caution him in doing this before speaking with MD (Neurontin adverse effect)   Denies other social & medical concern      Goals Addressed             This Visit's Progress    Home management of hypertension- care coordination services       Interventions Today    Flowsheet Row Most Recent Value  Chronic Disease   Chronic disease during today's visit Atrial Fibrillation (AFib), Other  [vit B 12, neurontin, exercise/warm muscles]  General Interventions   General Interventions Discussed/Reviewed General Interventions Reviewed, Labs, Doctor Visits  Labs --  [vit B 12]  Doctor Visits Discussed/Reviewed Doctor Visits Reviewed, PCP, Specialist  PCP/Specialist Visits Compliance with follow-up visit  Exercise Interventions   Exercise Discussed/Reviewed Exercise Reviewed, Physical Activity, Weight Managment  [encouraged activity]  Physical Activity Discussed/Reviewed Physical Activity Reviewed, Types of exercise  [encouraged warming muscles]  Weight Management Weight maintenance  Education Interventions   Education Provided Provided Education  Provided Verbal Education On Labs, Mental  Health/Coping with Illness, Medication, Sick Day Rules  Mental Health Interventions   Mental Health Discussed/Reviewed Mental Health Discussed, Coping Strategies  Nutrition Interventions   Nutrition Discussed/Reviewed Nutrition Discussed, Portion sizes, Fluid intake  Pharmacy Interventions   Pharmacy Dicussed/Reviewed Pharmacy Topics Reviewed, Medications and their functions, Affording Medications  Safety Interventions   Safety Discussed/Reviewed Safety Discussed, Home Safety  Home Safety Assistive Devices              SDOH assessments and interventions completed:  No     Care Coordination Interventions:  Yes, provided   Follow up plan: Follow up call scheduled for 11/10/23 2 pm     Encounter Outcome:  Patient Visit Completed   [name] L. Noelle Penner, RN, BSN, [facility]  VBCI Care Management Coordinator  [phone]  Fax: [phone]

## 2023-10-17 DIAGNOSIS — I482 Chronic atrial fibrillation, unspecified: Secondary | ICD-10-CM | POA: Diagnosis not present

## 2023-10-17 DIAGNOSIS — Z95818 Presence of other cardiac implants and grafts: Secondary | ICD-10-CM | POA: Diagnosis not present

## 2023-10-17 DIAGNOSIS — I5032 Chronic diastolic (congestive) heart failure: Secondary | ICD-10-CM | POA: Diagnosis not present

## 2023-10-24 ENCOUNTER — Other Ambulatory Visit: Payer: Self-pay | Admitting: Family Medicine

## 2023-11-07 ENCOUNTER — Ambulatory Visit: Payer: Medicare Other

## 2023-11-07 ENCOUNTER — Telehealth: Payer: Medicare Other | Admitting: Nurse Practitioner

## 2023-11-07 DIAGNOSIS — J40 Bronchitis, not specified as acute or chronic: Secondary | ICD-10-CM

## 2023-11-07 MED ORDER — DOXYCYCLINE HYCLATE 100 MG PO TABS: 100.0000 mg | ORAL_TABLET | Freq: Two times a day (BID) | ORAL | 0 refills | Status: AC

## 2023-11-07 NOTE — Progress Notes (Signed)
 Virtual Visit Consent   [name], you are scheduled for a virtual visit with a [facility] provider today. Just as with appointments in the office, your consent must be obtained to participate. Your consent will be active for this visit and any virtual visit you may have with one of our providers in the next 365 days. If you have a MyChart account, a copy of this consent can be sent to you electronically.  As this is a virtual visit, video technology does not allow for your provider to perform a traditional examination. This may limit your provider's ability to fully assess your condition. If your provider identifies any concerns that need to be evaluated in person or the need to arrange testing (such as labs, EKG, etc.), we will make arrangements to do so. Although advances in technology are sophisticated, we cannot ensure that it will always work on either your end or our end. If the connection with a video visit is poor, the visit may have to be switched to a telephone visit. With either a video or telephone visit, we are not always able to ensure that we have a secure connection.  By engaging in this virtual visit, you consent to the provision of healthcare and authorize for your insurance to be billed (if applicable) for the services provided during this visit. Depending on your insurance coverage, you may receive a charge related to this service.  I need to obtain your verbal consent now. Are you willing to proceed with your visit today? [name] has provided verbal consent on 11/07/2023 for a virtual visit (video or telephone). Lauraine Kitty, FNP  Date: 11/07/2023 11:33 AM  Virtual Visit via Video Note   I, Lauraine Kitty, connected with  [name]  ([id number], April 06, 1951) on 11/07/23 at 11:45 AM EST by a video-enabled telemedicine application and verified that I am speaking with the correct person using two identifiers.  Location: Patient: Virtual Visit Location Patient:  Home Provider: Virtual Visit Location Provider: Home Office   I discussed the limitations of evaluation and management by telemedicine and the availability of in person appointments. The patient expressed understanding and agreed to proceed.    History of Present Illness: [name] is a 73 y.o. who identifies as a male who was assigned male at birth, and is being seen today for symptoms of nasal congestion, chest congestion, cough   He has had a fever  His cough is productive   Symptom onset was 3 days ago after exposure to his brother with similar symptoms. He is unaware of what he was diagnosed with, COVID negative is on antibiotics for cough  Denies a history of asthma or COPD   He has tried Catering Manager Cough tablets today with some relief   Denies SOB or inability to take a deep breath  Problems:  Patient Active Problem List   Diagnosis Date Noted   SOB (shortness of breath) 09/01/2023   B12 deficiency 04/28/2023   Falls frequently 04/27/2023   Heart failure with recovered ejection fraction (HFrecEF) (HCC) 04/27/2023   Myelopathy concurrent with and due to spinal stenosis of cervical region (HCC) 04/01/2022   Spasticity 12/10/2021   Foot drop, bilateral 12/10/2021   Wheelchair dependence 12/10/2021   Nerve pain 12/10/2021   Cervical myelopathy (HCC)    Neurogenic bladder    S/P CABG x 3 04/27/2018   Obesity    Peripheral edema 06/30/2017   Thrombocytopenia (HCC) 05/25/2015   Chronic ITP (idiopathic  thrombocytopenia) (HCC) 05/25/2015   Hypothyroidism 08/29/2014   Hyperlipidemia 11/18/2013   Mixed hyperlipidemia 07/06/2011   Coronary artery disease involving native coronary artery of native heart with angina pectoris (HCC)    Chronic back pain 07/23/2008   Essential hypertension 10/09/2007    Allergies:  Allergies  Allergen Reactions   Penicillins Other (See Comments)    SYNCOPAL EPISODE  Has patient had a PCN reaction causing immediate rash,  facial/tongue/throat swelling, SOB or LIGHTHEADEDNESS with HYPOTENSION [SYNCOPE] #  #  #  YES  #  #  #  Has patient had a PCN reaction causing severe rash involving mucus membranes or skin necrosis:No Has patient had a PCN reaction that required hospitalization:No Has patient had a PCN reaction occurring within the last 10 years:Yes    Sulfa Antibiotics Swelling    SWELLING REACTION UNSPECIFIED > PER PREVIOUS PMH   Iodinated Contrast Media Hives   Zoloft  [Sertraline  Hcl] Other (See Comments)    Confusion Patient is taking Down    Medications:  Current Outpatient Medications:    acetaminophen  (TYLENOL ) 325 MG tablet, Take 2 tablets (650 mg total) by mouth every 6 (six) hours as needed for mild pain (or Fever >/= 101). (Patient taking differently: Take 1,000 mg by mouth every 6 (six) hours as needed for mild pain (or Fever >/= 101).), Disp: , Rfl:    alendronate  (FOSAMAX ) 70 MG tablet, TAKE 1 TABLET(70 MG) BY MOUTH EVERY 7 DAYS WITH A FULL GLASS OF WATER  AND ON AN EMPTY STOMACH, Disp: 4 tablet, Rfl: 11   amiodarone  (PACERONE ) 200 MG tablet, daily., Disp: , Rfl:    apixaban (ELIQUIS) 5 MG TABS tablet, Take by mouth 2 (two) times daily., Disp: , Rfl:    ascorbic acid (VITAMIN C) 500 MG tablet, Take by mouth daily., Disp: , Rfl:    baclofen  (LIORESAL ) 10 MG tablet, TAKE 1 TABLET BY MOUTH THREE TIMES DAILY AS NEEDED FOR SPASMS, Disp: 90 tablet, Rfl: 1   benzonatate  (TESSALON ) 100 MG capsule, Take 1 capsule (100 mg total) by mouth every 8 (eight) hours., Disp: 21 capsule, Rfl: 0   cetirizine (ZYRTEC) 10 MG tablet, Take by mouth., Disp: , Rfl:    cholecalciferol  (VITAMIN D3) 25 MCG (1000 UNIT) tablet, Take 1,000 Units by mouth daily., Disp: , Rfl:    doxycycline  (VIBRA -TABS) 100 MG tablet, Take 1 tablet (100 mg total) by mouth 2 (two) times daily. Take with food and water ., Disp: 14 tablet, Rfl: 0   empagliflozin  (JARDIANCE ) 10 MG TABS tablet, Take 1 tablet by mouth daily., Disp: , Rfl:     ezetimibe  (ZETIA ) 10 MG tablet, TAKE 1 TABLET(10 MG) BY MOUTH DAILY, Disp: 90 tablet, Rfl: 0   fluticasone  (FLONASE ) 50 MCG/ACT nasal spray, Place 1 spray into both nostrils 2 (two) times daily., Disp: 16 g, Rfl: 2   gabapentin  (NEURONTIN ) 100 MG capsule, 1 pill in the morning and 2 pills each evening, Disp: 270 capsule, Rfl: 5   levothyroxine  (SYNTHROID ) 100 MCG tablet, TAKE 1 TABLET(100 MCG) BY MOUTH DAILY 30 TO 60 MINUTES BEFORE BREAKFAST ON AN EMPTY STOMACH AND WITH A GLASS OF WATER , Disp: 90 tablet, Rfl: 1   lisinopril  (ZESTRIL ) 10 MG tablet, TAKE 1 TABLET(10 MG) BY MOUTH DAILY, Disp: 90 tablet, Rfl: 1   lisinopril  (ZESTRIL ) 20 MG tablet, Take 1 tablet (20 mg total) by mouth daily. (Patient taking differently: Take 10 mg by mouth daily.), Disp: 90 tablet, Rfl: 3   metoprolol  succinate (TOPROL -XL) 25 MG 24 hr  tablet, Take 1 tablet (25 mg total) by mouth 2 (two) times daily., Disp: 180 tablet, Rfl: 1   Multiple Vitamin (MULTIVITAMIN) capsule, Take 1 capsule by mouth daily., Disp: , Rfl:    nitroGLYCERIN  (NITROSTAT ) 0.4 MG SL tablet, PLACE 1 TABLET UNDER THE TONGUE EVERY 5 MINUTES AS NEEDED FOR CHEST PAINS. Strength: 0.4 mg, Disp: 25 tablet, Rfl: 3   oseltamivir  (TAMIFLU ) 75 MG capsule, Take 1 capsule (75 mg total) by mouth every 12 (twelve) hours., Disp: 10 capsule, Rfl: 0   pantoprazole  (PROTONIX ) 40 MG tablet, TAKE 1 TABLET(40 MG) BY MOUTH DAILY, Disp: 90 tablet, Rfl: 1   polycarbophil (FIBERCON) 625 MG tablet, Take 1 tablet (625 mg total) by mouth daily., Disp: 30 tablet, Rfl: 0   potassium chloride  (KLOR-CON  M) 10 MEQ tablet, TAKE 1 TABLET(10 MEQ) BY MOUTH DAILY, Disp: 90 tablet, Rfl: 0   rosuvastatin  (CRESTOR ) 40 MG tablet, TAKE 1 TABLET BY MOUTH EVERY EVENING, Disp: 90 tablet, Rfl: 1   sertraline  (ZOLOFT ) 100 MG tablet, TAKE ONE TABLET BY MOUTH ONCE A DAY, Disp: 90 tablet, Rfl: 3   tamsulosin  (FLOMAX ) 0.4 MG CAPS capsule, TAKE 2 CAPSULES BY MOUTH AT NIGHT, Disp: 180 capsule, Rfl: 0    torsemide  (DEMADEX ) 20 MG tablet, 1 to 2 each morning as needed (Patient taking differently: 1/2 tab midday), Disp: 180 tablet, Rfl: 1  Current Facility-Administered Medications:    cyanocobalamin  (VITAMIN B12) injection 1,000 mcg, 1,000 mcg, Intramuscular, Once, [name], Scott A, MD  Observations/Objective: Patient is well-developed, well-nourished in no acute distress.  Resting comfortably  at home.  Head is normocephalic, atraumatic.  No labored breathing.  Speech is clear and coherent with logical content.  Patient is alert and oriented at baseline.    Assessment and Plan:  1. Bronchitis (Primary)  Restart nasal spray  Advised OTC mucinex without decongestant for relief   - doxycycline  (VIBRA -TABS) 100 MG tablet; Take 1 tablet (100 mg total) by mouth 2 (two) times daily for 7 days.  Dispense: 14 tablet; Refill: 0     Follow Up Instructions: I discussed the assessment and treatment plan with the patient. The patient was provided an opportunity to ask questions and all were answered. The patient agreed with the plan and demonstrated an understanding of the instructions.  A copy of instructions were sent to the patient via MyChart unless otherwise noted below.    The patient was advised to call back or seek an in-person evaluation if the symptoms worsen or if the condition fails to improve as anticipated.    Lauraine Kitty, FNP

## 2023-11-10 ENCOUNTER — Ambulatory Visit: Payer: Self-pay | Admitting: *Deleted

## 2023-11-10 NOTE — Patient Outreach (Signed)
  Care Coordination   11/10/2023 Name: [name] MRN: [id number] DOB: 1951-10-06   Care Coordination Outreach Attempts:  An unsuccessful outreach was attempted for an appointment today.  Follow Up Plan:  Additional outreach attempts will be made to offer the patient complex care management information and services.   Encounter Outcome:  No Answer   Care Coordination Interventions:  No, not indicated     Kordel Leavy L. Ramonita, RN, BSN, [facility]  VBCI Care Management Coordinator  [phone]  Fax: [phone]

## 2023-11-13 ENCOUNTER — Telehealth: Payer: Self-pay | Admitting: *Deleted

## 2023-11-13 NOTE — Progress Notes (Signed)
 Complex Care Management Care Guide Note  11/13/2023 Name: RHYS LICHTY MRN: [id number] DOB: 1951/07/08  Adriana JAYSON Funk is a 73 y.o. year old male who is a primary care patient of [name], Glendia LABOR, MD and is actively engaged with the care management team. I reached out to Adriana JAYSON Funk by phone today to assist with re-scheduling  with the RN Case Manager.  Follow up plan: Unsuccessful telephone outreach attempt made. A HIPAA compliant phone message was left for the patient providing contact information and requesting a return call.  [facility]  Care Coordination Care Guide  Direct Dial: [phone]

## 2023-11-20 NOTE — Progress Notes (Signed)
Complex Care Management Care Guide Note  11/20/2023 Name: [name] MRN: [id number] DOB: [birth year]  [name] is a 73 y.o. year old male who is a primary care patient of [name], Jonna Coup, MD and is actively engaged with the care management team. I reached out to [name] by phone today to assist with re-scheduling  with the RN Case Manager.  Follow up plan: Unsuccessful telephone outreach attempt made. A HIPAA compliant phone message was left for the patient providing contact information and requesting a return call. No further outreach attempts will be made at this time. We have been unable to contact the patient.  Gwenevere Ghazi  [facility]  Value-Based Care Institute, [facility] Guide  Direct Dial: [phone]  Fax [phone]

## 2023-11-21 ENCOUNTER — Ambulatory Visit: Payer: Self-pay | Admitting: *Deleted

## 2023-11-21 NOTE — Patient Outreach (Signed)
  Care Coordination   Case closure  Visit Note   11/21/2023 Name: [name] MRN: [id number] DOB: [birth year]  [name] is a 73 y.o. year old male who sees [name], Jonna Coup, MD for primary care. I received an in basket from [facility] CMA, Norva Pavlov. Unable to reach patent for follow up RN CM care managers services with further call attempts  What matters to the patients health and wellness today?  Case closure    Goals Addressed             This Visit's Progress    Home management of hypertension- care coordination services       Interventions Today    Flowsheet Row Most Recent Value  Chronic Disease   Chronic disease during today's visit Other  [case closure - unable to reach to continue RN CM services]              SDOH assessments and interventions completed:  No     Care Coordination Interventions:  Yes, provided   Follow up plan: No further intervention required.   Encounter Outcome:  Patient Visit Completed   Cala Bradford L. Noelle Penner, RN, BSN, CCM [facility]  Value Based Care Institute, [facility] RN Care Manager Direct Dial: [phone]  Fax: [phone]

## 2023-11-28 ENCOUNTER — Other Ambulatory Visit: Payer: Self-pay | Admitting: Family Medicine

## 2023-11-28 DIAGNOSIS — R252 Cramp and spasm: Secondary | ICD-10-CM

## 2023-11-30 ENCOUNTER — Encounter: Payer: Self-pay | Admitting: Family Medicine

## 2023-11-30 ENCOUNTER — Other Ambulatory Visit: Payer: Self-pay | Admitting: Family Medicine

## 2023-11-30 DIAGNOSIS — R252 Cramp and spasm: Secondary | ICD-10-CM

## 2023-11-30 MED ORDER — BACLOFEN 10 MG PO TABS: ORAL_TABLET | ORAL | 3 refills | Status: DC

## 2023-12-08 ENCOUNTER — Ambulatory Visit: Payer: Self-pay | Admitting: *Deleted

## 2023-12-08 NOTE — Patient Outreach (Signed)
 Opened in error  Trenton L. Ramonita, RN, BSN, CCM   Value Based Care Institute, [facility] RN Care Manager Direct Dial: [phone]  Fax: [phone] Mailing Address: [address]  [place] [address] Website: [email or web address]

## 2023-12-22 ENCOUNTER — Other Ambulatory Visit: Payer: Self-pay | Admitting: Family Medicine

## 2023-12-25 ENCOUNTER — Encounter: Payer: Self-pay | Admitting: Family Medicine

## 2023-12-25 ENCOUNTER — Other Ambulatory Visit: Payer: Self-pay

## 2023-12-25 ENCOUNTER — Ambulatory Visit: Payer: Self-pay | Admitting: Family Medicine

## 2023-12-25 ENCOUNTER — Other Ambulatory Visit (HOSPITAL_COMMUNITY)
Admission: RE | Admit: 2023-12-25 | Discharge: 2023-12-25 | Disposition: A | Discharge status: Home / Self Care | Payer: Medicare Other | Source: Ambulatory Visit | Attending: Family Medicine | Admitting: Family Medicine

## 2023-12-25 DIAGNOSIS — R42 Dizziness and giddiness: Secondary | ICD-10-CM | POA: Diagnosis not present

## 2023-12-25 LAB — COMPREHENSIVE METABOLIC PANEL
ALT: 19 U/L (ref 0–44)
AST: 19 U/L (ref 15–41)
Albumin: 4.5 g/dL (ref 3.5–5.0)
Alkaline Phosphatase: 61 U/L (ref 38–126)
Anion gap: 10 (ref 5–15)
BUN: 21 mg/dL (ref 8–23)
CO2: 26 mmol/L (ref 22–32)
Calcium: 9.2 mg/dL (ref 8.9–10.3)
Chloride: 104 mmol/L (ref 98–111)
Creatinine, Ser: 1.25 mg/dL — ABNORMAL HIGH (ref 0.61–1.24)
GFR, Estimated: >60 mL/min (ref >60)
Glucose, Bld: 95 mg/dL (ref 70–99)
Potassium: 4.2 mmol/L (ref 3.5–5.1)
Sodium: 140 mmol/L (ref 135–145)
Total Bilirubin: 0.8 mg/dL (ref 0.0–1.2)
Total Protein: 7.3 g/dL (ref 6.5–8.1)

## 2023-12-25 LAB — CBC WITH DIFFERENTIAL/PLATELET
Abs Immature Granulocytes: 0.02 10*3/uL (ref 0.00–0.07)
Basophils Absolute: 0 10*3/uL (ref 0.0–0.1)
Basophils Relative: 1 %
Eosinophils Absolute: 0.1 10*3/uL (ref 0.0–0.5)
Eosinophils Relative: 2 %
HCT: 43.2 % (ref 39.0–52.0)
Hemoglobin: 13.6 g/dL (ref 13.0–17.0)
Immature Granulocytes: 0 %
Lymphocytes Relative: 15 %
Lymphs Abs: 0.8 10*3/uL (ref 0.7–4.0)
MCH: 30 pg (ref 26.0–34.0)
MCHC: 31.5 g/dL (ref 30.0–36.0)
MCV: 95.4 fL (ref 80.0–100.0)
Monocytes Absolute: 0.3 10*3/uL (ref 0.1–1.0)
Monocytes Relative: 6 %
Neutro Abs: 3.9 10*3/uL (ref 1.7–7.7)
Neutrophils Relative %: 76 %
Platelets: 96 10*3/uL — ABNORMAL LOW (ref 150–400)
RBC: 4.53 MIL/uL (ref 4.22–5.81)
RDW: 13.9 % (ref 11.5–15.5)
WBC: 5.1 10*3/uL (ref 4.0–10.5)
nRBC: 0 % (ref 0.0–0.2)

## 2023-12-25 NOTE — Telephone Encounter (Signed)
 Spoke with pt and informed him of the labs that were requested by the provider. He agreed to treatment plan.

## 2023-12-25 NOTE — Telephone Encounter (Signed)
 Nurses-please connect with family I would highly recommend CMP and CBC through the hospital lab today Please order those STAT Hopefully they can go today to get the lab work then do the office visit tomorrow as scheduled at 920 Obviously if the lab work shows critical values we will have to intervene today

## 2023-12-25 NOTE — Telephone Encounter (Signed)
  Chief Complaint: dizziness Symptoms: dizziness, petechia on legs Frequency: 2 days Pertinent Negatives: Patient denies CP, SOB, bleeding Disposition: [] ED /[] Urgent Care (no appt availability in office) / [x] Appointment(In office/virtual)/ []  [facility]/ [] Home Care/ [] Refused Recommended Disposition /[] [facility]/ []  Follow-up with PCP Additional Notes: Patient daughter, Maralyn Sago on [place], calls with patient present reporting "low platelets" and that patient needs steroids. States patient has had dizziness and is starting to get petechia on his legs x 2 days. Per protocol, patient to be evaluated within 24 hours. First available appointment with PCP 12/26/23 @ 0920. Patient scheduled. Care advice reviewed, patient verbalized understandingand denies further questions at this time. Alerting PCP for review.    Copied from CRM [id number]. Topic: Clinical - Red Word Triage >> Dec 25, 2023  7:39 AM [name] wrote: [facility] that prompted transfer to Nurse Triage: daughter Maralyn Sago states that father's platelets are dropping and showing signs of Petechiae. Reason for Disposition  [1] MODERATE dizziness (e.g., interferes with normal activities) AND [2] has NOT been evaluated by doctor (or NP/PA) for this  (Exception: Dizziness caused by heat exposure, sudden standing, or poor fluid intake.)  Answer Assessment - Initial Assessment Questions 1. DESCRIPTION: "Describe your dizziness."     When standing gets lightheaded. 2. LIGHTHEADED: "Do you feel lightheaded?" (e.g., somewhat faint, woozy, weak upon standing)     lightheaded 3. VERTIGO: "Do you feel like either you or the room is spinning or tilting?" (i.e. vertigo)     Denies 4. SEVERITY: "How bad is it?"  "Do you feel like you are going to faint?" "Can you stand and walk?"   - MILD: Feels slightly dizzy, but walking normally.   - MODERATE: Feels unsteady when walking, but not falling; interferes with normal activities (e.g., school,  work).   - SEVERE: Unable to walk without falling, or requires assistance to walk without falling; feels like passing out now.      Moderate, but that is normal due to traumatic spinal cord injury 5. ONSET:  "When did the dizziness begin?"     2 days ago 6. AGGRAVATING FACTORS: "Does anything make it worse?" (e.g., standing, change in head position)     Changing position 7. HEART RATE: "Can you tell me your heart rate?" "How many beats in 15 seconds?"  (Note: not all patients can do this)       Has not checked today 8. CAUSE: "What do you think is causing the dizziness?"     Low platelets 9. RECURRENT SYMPTOM: "Have you had dizziness before?" If Yes, ask: "When was the last time?" "What happened that time?"     Yes, low platelets approx 2 years ago 10. OTHER SYMPTOMS: "Do you have any other symptoms?" (e.g., fever, chest pain, vomiting, diarrhea, bleeding)       Petechia on legs  Protocols used: Dizziness - Lightheadedness-A-AH

## 2023-12-26 ENCOUNTER — Ambulatory Visit (INDEPENDENT_AMBULATORY_CARE_PROVIDER_SITE_OTHER): Payer: Medicare Other | Admitting: Family Medicine

## 2023-12-26 ENCOUNTER — Encounter: Payer: Self-pay | Admitting: Family Medicine

## 2023-12-26 VITALS — BP 118/62 | HR 66 | Temp 97.9°F | Ht 73.0 in | Wt 264.0 lb

## 2023-12-26 DIAGNOSIS — R7989 Other specified abnormal findings of blood chemistry: Secondary | ICD-10-CM

## 2023-12-26 DIAGNOSIS — R233 Spontaneous ecchymoses: Secondary | ICD-10-CM

## 2023-12-26 DIAGNOSIS — E538 Deficiency of other specified B group vitamins: Secondary | ICD-10-CM | POA: Diagnosis not present

## 2023-12-26 DIAGNOSIS — L989 Disorder of the skin and subcutaneous tissue, unspecified: Secondary | ICD-10-CM

## 2023-12-26 DIAGNOSIS — I10 Essential (primary) hypertension: Secondary | ICD-10-CM | POA: Diagnosis not present

## 2023-12-26 DIAGNOSIS — G959 Disease of spinal cord, unspecified: Secondary | ICD-10-CM

## 2023-12-26 MED ORDER — CYANOCOBALAMIN 1000 MCG/ML IJ SOLN
1000.0000 ug | Freq: Once | INTRAMUSCULAR | Status: AC
  Administered 2023-12-26: 1000 ug via INTRAMUSCULAR

## 2023-12-26 NOTE — Progress Notes (Signed)
 Subjective:    Patient ID: [name], male    DOB: 1951-03-30, 73 y.o.   MRN: [id number]  Discussed the use of AI scribe software for clinical note transcription with the patient, who gave verbal consent to proceed.  History of Present Illness   [name] is a 73 year old male with a history of low platelet count who presents with petechiae and swelling in one leg.  He has petechiae localized to one leg, which began a few days ago, with no petechiae on the other leg. His history of low platelet counts previously required high-dose steroids when critically low, under 5,000. Currently, his platelet count is 96,000. No systemic symptoms such as epistaxis, gum bleeding, or hematuria, which he experienced when his platelet count was extremely low. He also reports swelling in the leg with petechiae, which has a history of vessel harvesting for a previous surgery, contributing to the swelling. The swelling is more pronounced on this side compared to the other. The swelling is chronic.  He experiences headaches originating from the site of a previous cervical surgery performed approximately two and a half years ago. These headaches occur two to three times a week, last about five minutes, and are severe enough to make him shut his eyes. He sometimes experiences nausea with these headaches but does not typically feel off balance as he is usually sitting when they occur. He takes baclofen for muscle spasms, which helps prevent his leg from 'jumping'. Also experiencing increased arm weakness.  He has a history of chronic kidney disease, reportedly stage three, attributed to long-term medication use. Recent blood work showed a creatinine level of 1.25, slightly elevated, and a GFR that has fluctuated, with a recent drop to 49. He is advised to ensure proper hydration for accurate kidney function testing.  He is no longer taking Eliquis due to having a Watchman device implanted, which negates the need  for regular blood tests.         Review of Systems     Objective:    Physical Exam        General-in no acute distress Eyes-no discharge Lungs-respiratory rate normal, CTA CV-no murmurs,RRR Extremities skin warm dry no edema Neuro grossly normal Behavior normal, alert Patient does have bilateral hand weakness with the intrinsic muscles and some weakness in his biceps as well as well as his deltoids He also has some chronic swelling of the right leg and has some petechiae in the right leg but none on the left leg none on the abdomen chest back or arms He also has a seborrheic keratosis on top of his head that has multiple colors including brown to black       Assessment & Plan:  Assessment and Plan    Petechiae Localized to one leg with a history of vessel harvesting. Platelet count at 97,000. Discussed the risk of systemic petechiae indicating a need for prednisone treatment. -Monitor for petechiae in other areas and report immediately if observed.  Chronic Kidney Disease Mildly elevated creatinine at 1.25. Discussed the impact of hydration on creatinine levels and the need for appropriate medication dosing. -Order urine protein test. -Repeat blood work for creatinine mid-day after ensuring adequate hydration.  Cervical Myelopathy Reports of headaches and weakness. Previous cervical surgery 2.5 years ago. -Refer to neurosurgery for consultation and potential MRI.  Skin Lesion Multicolored mole on the head, likely benign. -Refer to dermatology for evaluation.  Vitamin B12 Deficiency Last injection 2 months ago. -  Check B12 level with next blood work. -Continue monthly B12 injections.  General Health Maintenance -Administer influenza vaccine today. -Follow-up office visit in 6 months.     1. Vitamin B12 deficiency (Primary) We will go ahead with the B12 shot today and check B12 level as well - Vitamin B12 - cyanocobalamin (VITAMIN B12) injection 1,000 mcg  2.  Essential hypertension Continue medication currently blood pressure decent range - Basic metabolic panel - Microalbumin / creatinine urine ratio  3. Elevated serum creatinine Further evaluation of this including metabolic 7 and urine micro protein more than likely CKD 3 AA - Basic metabolic panel - Microalbumin / creatinine urine ratio  4. Petechiae In the right lower leg but his platelet count is 97,000 no steroids indicated currently patient was given warning signs regarding extremely low platelets more than likely to petechiae on the right lower leg is more related to the venous congestion that is going on in his right leg I do not find evidence of a blood clot right leg  5. Cervical myelopathy (HCC) Patient having some weakness into both arms is had previous surgery in his neck is now having intermittent headaches that radiate from that area along with neck pain I believe patient would benefit from further evaluation Patient has a complex health patient [facility] His [facility]ologist is at [facility] so we will help set up with [facility] - Ambulatory referral to Neurosurgery  6. Skin lesion Referral to local dermatology for the area on top of his head - Ambulatory referral to Dermatology

## 2023-12-29 ENCOUNTER — Telehealth: Payer: Medicare Other | Admitting: Physician Assistant

## 2023-12-29 DIAGNOSIS — J069 Acute upper respiratory infection, unspecified: Secondary | ICD-10-CM | POA: Diagnosis not present

## 2023-12-29 MED ORDER — BENZONATATE 100 MG PO CAPS: 100.0000 mg | ORAL_CAPSULE | Freq: Three times a day (TID) | ORAL | 0 refills | Status: AC

## 2023-12-29 MED ORDER — DOXYCYCLINE HYCLATE 100 MG PO CAPS: 100.0000 mg | ORAL_CAPSULE | Freq: Two times a day (BID) | ORAL | 0 refills | Status: DC

## 2023-12-29 NOTE — Patient Instructions (Signed)
 [name], thank you for joining Oscar Meres, PA-C for today's virtual visit.  While this provider is not your primary care provider (PCP), if your PCP is located in our provider database this encounter information will be shared with them immediately following your visit.   A [facility] MyChart account gives you access to today's visit and all your visits, tests, and labs performed at [facility] " click here if you don't have a [name] MyChart account or go to [email or web address]  Consent: (Patient) [name] provided verbal consent for this virtual visit at the beginning of the encounter.  Current Medications:  Current Outpatient Medications:    acetaminophen (TYLENOL) 325 MG tablet, Take 2 tablets (650 mg total) by mouth every 6 (six) hours as needed for mild pain (or Fever >/= 101). (Patient taking differently: Take 1,000 mg by mouth every 6 (six) hours as needed for mild pain (or Fever >/= 101).), Disp: , Rfl:    alendronate (FOSAMAX) 70 MG tablet, TAKE 1 TABLET(70 MG) BY MOUTH EVERY 7 DAYS WITH A FULL GLASS OF WATER AND ON AN EMPTY STOMACH, Disp: 4 tablet, Rfl: 11   amiodarone (PACERONE) 200 MG tablet, daily., Disp: , Rfl:    apixaban (ELIQUIS) 5 MG TABS tablet, Take by mouth 2 (two) times daily., Disp: , Rfl:    ascorbic acid (VITAMIN C) 500 MG tablet, Take by mouth daily., Disp: , Rfl:    baclofen (LIORESAL) 10 MG tablet, 1 taken 3 times daily as needed caution drowsiness, Disp: 90 tablet, Rfl: 3   cetirizine (ZYRTEC) 10 MG tablet, Take by mouth., Disp: , Rfl:    cholecalciferol (VITAMIN D3) 25 MCG (1000 UNIT) tablet, Take 1,000 Units by mouth daily., Disp: , Rfl:    empagliflozin (JARDIANCE) 10 MG TABS tablet, Take 1 tablet by mouth daily., Disp: , Rfl:    ezetimibe (ZETIA) 10 MG tablet, TAKE 1 TABLET(10 MG) BY MOUTH DAILY, Disp: 90 tablet, Rfl: 0   fluticasone (FLONASE) 50 MCG/ACT nasal spray, Place 1 spray into both nostrils 2 (two) times daily.,  Disp: 16 g, Rfl: 2   gabapentin (NEURONTIN) 100 MG capsule, 1 pill in the morning and 2 pills each evening, Disp: 270 capsule, Rfl: 5   levothyroxine (SYNTHROID) 100 MCG tablet, TAKE 1 TABLET(100 MCG) BY MOUTH DAILY 30 TO 60 MINUTES BEFORE BREAKFAST ON AN EMPTY STOMACH AND WITH A GLASS OF WATER, Disp: 90 tablet, Rfl: 1   lisinopril (ZESTRIL) 10 MG tablet, TAKE 1 TABLET(10 MG) BY MOUTH DAILY, Disp: 90 tablet, Rfl: 1   lisinopril (ZESTRIL) 20 MG tablet, Take 1 tablet (20 mg total) by mouth daily. (Patient taking differently: Take 10 mg by mouth daily.), Disp: 90 tablet, Rfl: 3   metoprolol succinate (TOPROL-XL) 25 MG 24 hr tablet, Take 1 tablet (25 mg total) by mouth 2 (two) times daily., Disp: 180 tablet, Rfl: 1   Multiple Vitamin (MULTIVITAMIN) capsule, Take 1 capsule by mouth daily., Disp: , Rfl:    nitroGLYCERIN (NITROSTAT) 0.4 MG SL tablet, PLACE 1 TABLET UNDER THE TONGUE EVERY 5 MINUTES AS NEEDED FOR CHEST PAINS. Strength: 0.4 mg, Disp: 25 tablet, Rfl: 3   pantoprazole (PROTONIX) 40 MG tablet, TAKE 1 TABLET(40 MG) BY MOUTH DAILY, Disp: 90 tablet, Rfl: 1   polycarbophil (FIBERCON) 625 MG tablet, Take 1 tablet (625 mg total) by mouth daily., Disp: 30 tablet, Rfl: 0   potassium chloride (KLOR-CON M) 10 MEQ tablet, TAKE 1 TABLET(10 MEQ) BY MOUTH DAILY, Disp: 90  tablet, Rfl: 0   rosuvastatin (CRESTOR) 40 MG tablet, TAKE 1 TABLET BY MOUTH EVERY EVENING, Disp: 90 tablet, Rfl: 1   sertraline (ZOLOFT) 100 MG tablet, TAKE ONE TABLET BY MOUTH ONCE A DAY, Disp: 90 tablet, Rfl: 3   tamsulosin (FLOMAX) 0.4 MG CAPS capsule, TAKE 2 CAPSULES BY MOUTH AT NIGHT, Disp: 180 capsule, Rfl: 0   torsemide (DEMADEX) 20 MG tablet, 1 to 2 each morning as needed (Patient taking differently: 1/2 tab midday), Disp: 180 tablet, Rfl: 1  Current Facility-Administered Medications:    cyanocobalamin (VITAMIN B12) injection 1,000 mcg, 1,000 mcg, Intramuscular, Once, [name], Scott A, MD   Medications ordered in this encounter:   No orders of the defined types were placed in this encounter.    *If you need refills on other medications prior to your next appointment, please contact your pharmacy*  Follow-Up: Call back or seek an in-person evaluation if the symptoms worsen or if the condition fails to improve as anticipated.  [facility] [phone]  Other Instructions Please take an over the counter covid and influenza test  You were given a prescription for antibiotics. Please take the antibiotic prescription fully.   Take tessalon as directed for cough   Follow up with your regular doctor in 1 week for reassessment and seek care sooner if your symptoms worsen or fail to improve.    If you have been instructed to have an in-person evaluation today at a local Urgent Care facility, please use the link below. It will take you to a list of all of our available [facility], including address, phone number and hours of operation. Please do not delay care.  [facility]  If you or a family member do not have a primary care provider, use the link below to schedule a visit and establish care. When you choose a [facility] primary care physician or advanced practice provider, you gain a long-term partner in health. Find a Primary Care Provider  Learn more about [facility]'s in-office and virtual care options:  - Get Care Now

## 2023-12-29 NOTE — Progress Notes (Signed)
 [name], [name] are scheduled for a virtual visit with your provider today.    Just as we do with appointments in the office, we must obtain your consent to participate.  Your consent will be active for this visit and any virtual visit you may have with one of our providers in the next 365 days.    If you have a MyChart account, I can also send a copy of this consent to you electronically.  All virtual visits are billed to your insurance company just like a traditional visit in the office.  As this is a virtual visit, video technology does not allow for your provider to perform a traditional examination.  This may limit your provider's ability to fully assess your condition.  If your provider identifies any concerns that need to be evaluated in person or the need to arrange testing such as labs, EKG, etc, we will make arrangements to do so.    Although advances in technology are sophisticated, we cannot ensure that it will always work on either your end or our end.  If the connection with a video visit is poor, we may have to switch to a telephone visit.  With either a video or telephone visit, we are not always able to ensure that we have a secure connection.   I need to obtain your verbal consent now.   Are you willing to proceed with your visit today?   [name] has provided verbal consent on 12/29/2023 for a virtual visit (video or telephone).   Karrie Meres, [place] 12/29/2023  10:22 AM   Date:  12/29/2023   ID:  [name], DOB [birth year], MRN [id number]  Patient Location: Home Provider Location: Home Office   Participants: Patient and Provider for Visit and Wrap up  Method of visit: Video  Location of Patient: Home Location of Provider: Home Office Consent was obtain for visit over the video. Services rendered by provider: Visit was performed via video  A video enabled telemedicine application was used and I verified that I am speaking with the correct person using two  identifiers.  PCP:  Babs Sciara, MD   Chief Complaint:  URI  History of Present Illness:    [name] is a 73 y.o. male with history as stated below. Presents video telehealth for an acute care visit  Pt reports chest congestion, fever, fatigue, cough, rhinorrhea, nasal congestion, that started yesterday. Denies vomiting, diarrhea, chest pain, sob, ble swelling. Denies sick contacts.   Past Medical, Surgical, Social History, Allergies, and Medications have been Reviewed.  Past Medical History:  Diagnosis Date   Allergy    Carpal tunnel syndrome    Chronic back pain    Chronic combined systolic and diastolic congestive heart failure (HCC)    Chronic ITP (idiopathic thrombocytopenia) (HCC) 05/25/2015   Coronary artery disease    Coronary artery disease involving native coronary artery of native heart with angina pectoris (HCC)    Degenerative disc disease    Epidural hematoma (HCC)    C3-4 ACDF 07/19/21, presented 07/22/21 with BLE/UE weakness/epidural hematoma, s/p revision & hematoma evacuation   GERD (gastroesophageal reflux disease)    History of hiatal hernia    History of thrombocytopenia    Hypercholesteremia    Hypertension    Hypothyroid    Lumbar pain    Myocardial infarction (HCC) 2009   Obesity    Pneumonia    around age 62   S/P CABG x 3 04/27/2018  LIMA to LAD SVG to OM1 SVG to OM2   Shortness of breath dyspnea     No outpatient medications have been marked as taking for the 12/29/23 encounter (Video Visit) with [facility] PROVIDER.   Current Facility-Administered Medications for the 12/29/23 encounter (Video Visit) with [facility] PROVIDER  Medication   cyanocobalamin (VITAMIN B12) injection 1,000 mcg     Allergies:   Other, Penicillins, Sulfa antibiotics, Iodinated contrast media, and Zoloft [sertraline hcl]   ROS See HPI for history of present illness.  Physical Exam Constitutional:      Appearance: Normal appearance. He is not  ill-appearing.  Pulmonary:     Comments: No respiratory distress Neurological:     Mental Status: He is alert.              MDM: Pt with uri sxs x1 day with fevers. Multiple comorbidities. May be viral however given recent mycoplasma pneumonia outbreak will cover with abx. Rx for tessalon also given. Pt advised to buy OTC covid/flu test and if positive he will need to complete another visit. Advised on pcp follow up and ER precautions   Tests Ordered: No orders of the defined types were placed in this encounter.   Medication Changes: No orders of the defined types were placed in this encounter.    Disposition:  Follow up  Signed, Karrie Meres, PA-C  12/29/2023 10:22 AM

## 2024-01-02 ENCOUNTER — Emergency Department (HOSPITAL_COMMUNITY)

## 2024-01-02 ENCOUNTER — Other Ambulatory Visit: Payer: Self-pay

## 2024-01-02 ENCOUNTER — Emergency Department (HOSPITAL_COMMUNITY)
Admission: EM | Admit: 2024-01-02 | Discharge: 2024-01-02 | Discharge status: Against Medical Advice | Attending: Emergency Medicine | Admitting: Emergency Medicine

## 2024-01-02 ENCOUNTER — Encounter (HOSPITAL_COMMUNITY): Payer: Self-pay

## 2024-01-02 DIAGNOSIS — Z5321 Procedure and treatment not carried out due to patient leaving prior to being seen by health care provider: Secondary | ICD-10-CM | POA: Insufficient documentation

## 2024-01-02 DIAGNOSIS — J101 Influenza due to other identified influenza virus with other respiratory manifestations: Secondary | ICD-10-CM | POA: Diagnosis not present

## 2024-01-02 DIAGNOSIS — R509 Fever, unspecified: Secondary | ICD-10-CM | POA: Diagnosis not present

## 2024-01-02 DIAGNOSIS — R059 Cough, unspecified: Secondary | ICD-10-CM | POA: Diagnosis not present

## 2024-01-02 DIAGNOSIS — I517 Cardiomegaly: Secondary | ICD-10-CM | POA: Diagnosis not present

## 2024-01-02 LAB — RESP PANEL BY RT-PCR (RSV, FLU A&B, COVID)  RVPGX2
Influenza A by PCR: POSITIVE — AB
Influenza B by PCR: NEGATIVE
Resp Syncytial Virus by PCR: NEGATIVE
SARS Coronavirus 2 by RT PCR: NEGATIVE

## 2024-01-02 NOTE — ED Triage Notes (Signed)
 Pt c/o flulike symptoms of cough, fever, fever x1 week.

## 2024-01-02 NOTE — ED Notes (Signed)
Per registration pt left the facility 

## 2024-01-03 ENCOUNTER — Encounter: Payer: Self-pay | Admitting: Physician Assistant

## 2024-01-03 ENCOUNTER — Telehealth: Payer: Self-pay | Admitting: Family Medicine

## 2024-01-03 ENCOUNTER — Ambulatory Visit: Admitting: Physician Assistant

## 2024-01-03 VITALS — BP 130/74 | HR 98 | Temp 99.4°F | Ht 73.0 in | Wt 264.0 lb

## 2024-01-03 DIAGNOSIS — J101 Influenza due to other identified influenza virus with other respiratory manifestations: Secondary | ICD-10-CM | POA: Diagnosis not present

## 2024-01-03 MED ORDER — PROMETHAZINE-DM 6.25-15 MG/5ML PO SYRP: 5.0000 mL | ORAL_SOLUTION | Freq: Four times a day (QID) | ORAL | 0 refills | Status: AC | PRN

## 2024-01-03 MED ORDER — PREDNISONE 20 MG PO TABS: 40.0000 mg | ORAL_TABLET | Freq: Every day | ORAL | 0 refills | Status: AC

## 2024-01-03 NOTE — Assessment & Plan Note (Signed)
 Patient appears stable today. Benign exam, lungs clear to auscultation bilaterally, chest x-ray done at the ER yesterday showed no signs of infection or pathology. Supportive care reviewed with patient. Discussed with patient that there are no indications for antibiotics at this time, and viral respiratory illness can be persistent in duration. Prednisone x 5 days and promethazine DM for cough. Tylenol for pain as needed. May continue with OTC cold medications. Advised Mucines. Patient instructed to return to clinic if worsening shortness of breath, chest pain, hypoxia, or other concerns. Patient agreeable to plan.

## 2024-01-03 NOTE — Telephone Encounter (Signed)
 Nurses Please reach out to patient He went to the ER was triaged but left before being seen His flu test was positive Please see how he is doing I may be able to work him in either in person this afternoon if he is having breathing difficulties or virtual visit Please touch base with him this morning to see what they would like to do

## 2024-01-03 NOTE — Progress Notes (Signed)
 Acute Office Visit  Subjective:     Patient ID: [name], male    DOB: 11-Apr-1951, 73 y.o.   MRN: [id number]   Patient is in today for follow up regarding positive flu test in the ER yesterday. Patient reports starting with cough, fever, and fatigue last week. He had a telehealth visit and Thursday and started on antibiotics for suspected atypical pneumonia. He reports antibiotics were not effective over the weekend and went to the ER for further evaluation yesterday. At that time he tested positive for flu A, however left before being seen. Today he complains of persistent cough, fatigue, myalgias, and evening fevers. He has been taking Tylenol at home and states symptoms are unchanged since onset last week.   Review of Systems  Constitutional:  Positive for fever and malaise/fatigue.  HENT:  Negative for congestion and sore throat.   Respiratory:  Positive for cough. Negative for shortness of breath and wheezing.   Cardiovascular:  Negative for chest pain and palpitations.  Gastrointestinal:  Negative for abdominal pain and vomiting.  Musculoskeletal:  Positive for myalgias.  Neurological:  Negative for headaches.        Objective:     BP 130/74   Pulse 98   Temp 99.4 F (37.4 C)   Ht 6\' 1"  (1.854 m)   Wt 264 lb (119.7 kg)   SpO2 96%   BMI 34.83 kg/m   Physical Exam Vitals reviewed.  Constitutional:      General: He is not in acute distress.    Appearance: Normal appearance.  HENT:     Nose: Nose normal.     Mouth/Throat:     Mouth: Mucous membranes are moist.     Pharynx: Oropharynx is clear.  Eyes:     Extraocular Movements: Extraocular movements intact.     Conjunctiva/sclera: Conjunctivae normal.  Cardiovascular:     Rate and Rhythm: Normal rate and regular rhythm.     Heart sounds: No murmur heard.    No friction rub. No gallop.  Pulmonary:     Effort: Pulmonary effort is normal.     Breath sounds: Normal breath sounds. No stridor. No wheezing,  rhonchi or rales.  Musculoskeletal:        General: Normal range of motion.  Skin:    General: Skin is warm and dry.     Capillary Refill: Capillary refill takes less than 2 seconds.  Neurological:     General: No focal deficit present.     Mental Status: He is alert and oriented to person, place, and time.  Psychiatric:        Mood and Affect: Mood normal.        Behavior: Behavior normal.     No results found for any visits on 01/03/24.      Assessment & Plan:  Influenza A Assessment & Plan: Patient appears stable today. Benign exam, lungs clear to auscultation bilaterally, chest x-ray done at the ER yesterday showed no signs of infection or pathology. Supportive care reviewed with patient. Discussed with patient that there are no indications for antibiotics at this time, and viral respiratory illness can be persistent in duration. Prednisone x 5 days and promethazine DM for cough. Tylenol for pain as needed. May continue with OTC cold medications. Advised Mucines. Patient instructed to return to clinic if worsening shortness of breath, chest pain, hypoxia, or other concerns. Patient agreeable to plan.    Orders: -     Promethazine-DM; Take 5 mLs  by mouth 4 (four) times daily as needed.  Dispense: 118 mL; Refill: 0 -     predniSONE; Take 2 tablets (40 mg total) by mouth daily for 5 days.  Dispense: 10 tablet; Refill: 0    Return if symptoms worsen or fail to improve.  Toni Amend Kage Willmann, PA-C

## 2024-01-03 NOTE — Telephone Encounter (Signed)
 Patient scheduled appointment with PA today at 2:40pm

## 2024-01-17 DIAGNOSIS — X32XXXA Exposure to sunlight, initial encounter: Secondary | ICD-10-CM | POA: Diagnosis not present

## 2024-01-17 DIAGNOSIS — L820 Inflamed seborrheic keratosis: Secondary | ICD-10-CM | POA: Diagnosis not present

## 2024-01-17 DIAGNOSIS — L570 Actinic keratosis: Secondary | ICD-10-CM | POA: Diagnosis not present

## 2024-01-17 DIAGNOSIS — L821 Other seborrheic keratosis: Secondary | ICD-10-CM | POA: Diagnosis not present

## 2024-01-23 ENCOUNTER — Ambulatory Visit: Payer: Medicare Other

## 2024-01-23 DIAGNOSIS — G9761 Postprocedural hematoma of a nervous system organ or structure following a nervous system procedure: Secondary | ICD-10-CM | POA: Diagnosis not present

## 2024-01-23 DIAGNOSIS — G959 Disease of spinal cord, unspecified: Secondary | ICD-10-CM | POA: Diagnosis not present

## 2024-01-23 DIAGNOSIS — M542 Cervicalgia: Secondary | ICD-10-CM | POA: Diagnosis not present

## 2024-01-23 DIAGNOSIS — M4722 Other spondylosis with radiculopathy, cervical region: Secondary | ICD-10-CM | POA: Diagnosis not present

## 2024-01-23 DIAGNOSIS — M4322 Fusion of spine, cervical region: Secondary | ICD-10-CM | POA: Diagnosis not present

## 2024-01-23 DIAGNOSIS — M4312 Spondylolisthesis, cervical region: Secondary | ICD-10-CM | POA: Diagnosis not present

## 2024-01-23 DIAGNOSIS — M5412 Radiculopathy, cervical region: Secondary | ICD-10-CM | POA: Diagnosis not present

## 2024-01-23 DIAGNOSIS — Z981 Arthrodesis status: Secondary | ICD-10-CM | POA: Diagnosis not present

## 2024-01-25 ENCOUNTER — Other Ambulatory Visit: Payer: Self-pay | Admitting: Family Medicine

## 2024-01-25 ENCOUNTER — Telehealth: Payer: Self-pay

## 2024-01-25 DIAGNOSIS — N319 Neuromuscular dysfunction of bladder, unspecified: Secondary | ICD-10-CM

## 2024-01-25 MED ORDER — EZETIMIBE 10 MG PO TABS: 10.0000 mg | ORAL_TABLET | Freq: Every day | ORAL | 3 refills | Status: DC

## 2024-01-25 NOTE — Telephone Encounter (Signed)
 Prescription Request  01/25/2024  LOV: Visit date not found  What is the name of the medication or equipment? ezetimibe (ZETIA) 10 MG tablet   Have you contacted your pharmacy to request a refill? Yes   Which pharmacy would you like this sent to?  [id number] - [place], [place] - [address] [place] [address] Phone: [phone] Fax: [phone]    Patient notified that their request is being sent to the clinical staff for review and that they should receive a response within 2 business days.   Please advise at Mobile [phone] (mobile)

## 2024-01-26 ENCOUNTER — Other Ambulatory Visit: Payer: Self-pay

## 2024-01-26 MED ORDER — SERTRALINE HCL 100 MG PO TABS: ORAL_TABLET | ORAL | 1 refills | Status: DC

## 2024-01-26 NOTE — Telephone Encounter (Signed)
 Nurses-patient has been on Zoloft.  Zoloft is sertraline.  His allergy list states sertraline in 2014 caused him to feel off.  Please verify that he does fine with sertraline.  If he does he may have 90-day with 1 refill.  Please also remove this from his allergy list if it is not truly an allergy thank you if any questions concerns issues let me know

## 2024-03-03 ENCOUNTER — Other Ambulatory Visit: Payer: Self-pay | Admitting: Family Medicine

## 2024-03-04 ENCOUNTER — Other Ambulatory Visit: Payer: Self-pay

## 2024-03-04 MED ORDER — METOPROLOL SUCCINATE ER 25 MG PO TB24: 25.0000 mg | ORAL_TABLET | Freq: Two times a day (BID) | ORAL | 1 refills | Status: DC

## 2024-03-07 ENCOUNTER — Other Ambulatory Visit: Payer: Self-pay

## 2024-03-07 MED ORDER — LISINOPRIL 10 MG PO TABS: 10.0000 mg | ORAL_TABLET | Freq: Every day | ORAL | 3 refills | Status: DC

## 2024-03-25 ENCOUNTER — Other Ambulatory Visit: Payer: Self-pay | Admitting: Family Medicine

## 2024-03-26 ENCOUNTER — Other Ambulatory Visit: Payer: Self-pay

## 2024-03-26 MED ORDER — TAMSULOSIN HCL 0.4 MG PO CAPS: 0.8000 mg | ORAL_CAPSULE | Freq: Every day | ORAL | 2 refills | Status: DC

## 2024-04-11 ENCOUNTER — Other Ambulatory Visit: Payer: Self-pay | Admitting: Family Medicine

## 2024-04-15 ENCOUNTER — Other Ambulatory Visit: Payer: Self-pay

## 2024-04-15 MED ORDER — LEVOTHYROXINE SODIUM 100 MCG PO TABS: 100.0000 ug | ORAL_TABLET | Freq: Every day | ORAL | 3 refills | Status: DC

## 2024-04-16 DIAGNOSIS — I482 Chronic atrial fibrillation, unspecified: Secondary | ICD-10-CM | POA: Diagnosis not present

## 2024-04-16 DIAGNOSIS — I5023 Acute on chronic systolic (congestive) heart failure: Secondary | ICD-10-CM | POA: Diagnosis not present

## 2024-04-16 DIAGNOSIS — I5032 Chronic diastolic (congestive) heart failure: Secondary | ICD-10-CM | POA: Diagnosis not present

## 2024-04-23 ENCOUNTER — Other Ambulatory Visit: Payer: Self-pay

## 2024-04-23 DIAGNOSIS — R9431 Abnormal electrocardiogram [ECG] [EKG]: Secondary | ICD-10-CM | POA: Diagnosis not present

## 2024-04-23 DIAGNOSIS — I5023 Acute on chronic systolic (congestive) heart failure: Secondary | ICD-10-CM | POA: Diagnosis not present

## 2024-04-23 DIAGNOSIS — I110 Hypertensive heart disease with heart failure: Secondary | ICD-10-CM | POA: Diagnosis not present

## 2024-04-23 DIAGNOSIS — I340 Nonrheumatic mitral (valve) insufficiency: Secondary | ICD-10-CM | POA: Diagnosis not present

## 2024-04-23 DIAGNOSIS — I480 Paroxysmal atrial fibrillation: Secondary | ICD-10-CM | POA: Diagnosis not present

## 2024-04-23 DIAGNOSIS — E039 Hypothyroidism, unspecified: Secondary | ICD-10-CM | POA: Diagnosis not present

## 2024-04-23 DIAGNOSIS — E785 Hyperlipidemia, unspecified: Secondary | ICD-10-CM | POA: Diagnosis not present

## 2024-04-23 DIAGNOSIS — R001 Bradycardia, unspecified: Secondary | ICD-10-CM | POA: Diagnosis not present

## 2024-04-23 DIAGNOSIS — I440 Atrioventricular block, first degree: Secondary | ICD-10-CM | POA: Diagnosis not present

## 2024-04-23 DIAGNOSIS — Z951 Presence of aortocoronary bypass graft: Secondary | ICD-10-CM | POA: Diagnosis not present

## 2024-04-23 DIAGNOSIS — I2510 Atherosclerotic heart disease of native coronary artery without angina pectoris: Secondary | ICD-10-CM | POA: Diagnosis not present

## 2024-04-23 MED ORDER — PANTOPRAZOLE SODIUM 40 MG PO TBEC: 40.0000 mg | DELAYED_RELEASE_TABLET | Freq: Every day | ORAL | 1 refills | Status: DC

## 2024-05-09 ENCOUNTER — Other Ambulatory Visit: Payer: Self-pay

## 2024-05-09 DIAGNOSIS — N319 Neuromuscular dysfunction of bladder, unspecified: Secondary | ICD-10-CM

## 2024-05-09 MED ORDER — ROSUVASTATIN CALCIUM 40 MG PO TABS: ORAL_TABLET | ORAL | 1 refills | Status: DC

## 2024-06-06 ENCOUNTER — Other Ambulatory Visit: Payer: Self-pay | Admitting: Family Medicine

## 2024-06-06 DIAGNOSIS — R252 Cramp and spasm: Secondary | ICD-10-CM

## 2024-06-06 NOTE — Telephone Encounter (Signed)
 Copied from CRM #[id number]. Topic: Clinical - Medication Refill >> Jun 06, 2024  3:41 PM Sophia H wrote: Medication: baclofen  (LIORESAL ) 10 MG tablet   Has the patient contacted their pharmacy? Yes, pharmacy is calling on behalf of the patient , have been faxing over    This is the patient's preferred pharmacy:  [id number] - [place], [place] - [address] [place] [address] Phone: [phone] Fax: [phone]  Is this the correct pharmacy for this prescription? Yes If no, delete pharmacy and type the correct one.   Has the prescription been filled recently? Yes  Is the patient out of the medication? Yes  Has the patient been seen for an appointment in the last year OR does the patient have an upcoming appointment? Yes, August 25  Can we respond through MyChart? Yes  Agent: Please be advised that Rx refills may take up to 3 business days. We ask that you follow-up with your pharmacy.

## 2024-06-06 NOTE — Telephone Encounter (Unsigned)
 Copied from CRM #[id number]. Topic: Clinical - Medication Refill >> Jun 06, 2024 12:25 PM DeAngela L wrote: Medication: baclofen  (LIORESAL ) 10 MG tablet  The patient will be out of medication and [facility] is calling to ask if this prescription could be completed  so they can fill it today, the automated system failed to send a week ago at [facility]   Has the patient contacted their pharmacy? Yes  (Agent: If no, request that the patient contact the pharmacy for the refill. If patient does not wish to contact the pharmacy document the reason why and proceed with request.) (Agent: If yes, when and what did the pharmacy advise?)  This is the patient's preferred pharmacy:  [id number] - [place], [place] - [address]  [place] [address] Phone: [phone] Fax: [phone]  Is this the correct pharmacy for this prescription? Yes  If no, delete pharmacy and type the correct one.   Has the prescription been filled recently? Yes   Is the patient out of the medication? Yes   Has the patient been seen for an appointment in the last year OR does the patient have an upcoming appointment? Yes   Can we respond through MyChart? Unknown   Agent: Please be advised that Rx refills may take up to 3 business days. We ask that you follow-up with your pharmacy.

## 2024-06-07 ENCOUNTER — Other Ambulatory Visit: Payer: Self-pay

## 2024-06-07 DIAGNOSIS — R252 Cramp and spasm: Secondary | ICD-10-CM

## 2024-06-07 MED ORDER — BACLOFEN 10 MG PO TABS: 10.0000 mg | ORAL_TABLET | Freq: Three times a day (TID) | ORAL | 0 refills | Status: DC | PRN

## 2024-06-24 ENCOUNTER — Ambulatory Visit (INDEPENDENT_AMBULATORY_CARE_PROVIDER_SITE_OTHER): Payer: Medicare Other | Admitting: Family Medicine

## 2024-06-24 VITALS — BP 100/64 | HR 71 | Temp 98.3°F | Ht 73.0 in | Wt 282.8 lb

## 2024-06-24 DIAGNOSIS — R7989 Other specified abnormal findings of blood chemistry: Secondary | ICD-10-CM

## 2024-06-24 DIAGNOSIS — E7849 Other hyperlipidemia: Secondary | ICD-10-CM

## 2024-06-24 DIAGNOSIS — G959 Disease of spinal cord, unspecified: Secondary | ICD-10-CM

## 2024-06-24 DIAGNOSIS — E538 Deficiency of other specified B group vitamins: Secondary | ICD-10-CM | POA: Diagnosis not present

## 2024-06-24 DIAGNOSIS — I5042 Chronic combined systolic (congestive) and diastolic (congestive) heart failure: Secondary | ICD-10-CM | POA: Diagnosis not present

## 2024-06-24 DIAGNOSIS — M810 Age-related osteoporosis without current pathological fracture: Secondary | ICD-10-CM | POA: Diagnosis not present

## 2024-06-24 DIAGNOSIS — D693 Immune thrombocytopenic purpura: Secondary | ICD-10-CM

## 2024-06-24 DIAGNOSIS — I10 Essential (primary) hypertension: Secondary | ICD-10-CM

## 2024-06-24 DIAGNOSIS — M792 Neuralgia and neuritis, unspecified: Secondary | ICD-10-CM

## 2024-06-24 DIAGNOSIS — I480 Paroxysmal atrial fibrillation: Secondary | ICD-10-CM | POA: Diagnosis not present

## 2024-06-24 DIAGNOSIS — Z79899 Other long term (current) drug therapy: Secondary | ICD-10-CM

## 2024-06-24 MED ORDER — GABAPENTIN 100 MG PO CAPS
ORAL_CAPSULE | ORAL | 5 refills | Status: AC
  Filled 2024-09-30: qty 120, 30d supply, fill #0
  Filled 2024-11-01: qty 120, 30d supply, fill #1

## 2024-06-24 NOTE — Progress Notes (Signed)
 Subjective:    Patient ID: [name], male    DOB: 20-Dec-1950, 73 y.o.   MRN: [id number]  HPI [name] is a 73 year old male with neuropathy who presents with worsening symptoms and medication management.  He experiences severe stinging and burning pain in his feet, describing them as 'killing me'. The pain is persistent and worsening over time. His feet swell significantly, especially when he gets into a car, even without shoes. He uses knee support when wearing pants but finds it cumbersome in the summer.  He has noticed worsening leg strength and significant swelling in his feet, leading to an increase in shoe size from 13 to 15. He describes numbness in his feet, particularly around the toes, and has developed calluses due to altered pressure distribution from neuropathy.  He takes gabapentin , two at night and one in the morning. He also takes Tylenol  500 mg, two at night. He has difficulty sleeping due to discomfort and sleeps in a recliner as lying flat exacerbates his neck pain, which is related to three neck fusions.  He mentions a history of AFib and had an inversion procedure about ten weeks ago. He is scheduled for a video conference in September to discuss further management if AFib recurs. He has not recently checked his platelet levels but plans to do so soon.  He has a history of neck fusions, which contribute to his inability to sleep in a bed. He has tried various pillow configurations without relief.   Review of Systems     Objective:   Physical Exam  General-in no acute distress Eyes-no discharge Lungs-respiratory rate normal, CTA CV-no murmurs,RRR Extremities skin warm dry mild pedal edema Neuro grossly normal Behavior normal, alert no sign of decompensated heart failure no crackles in the lungs       Assessment & Plan:   Peripheral neuropathy with associated lower extremity edema and calluses Chronic neuropathy with significant pain, worsening leg  strength, and edema. Gabapentin  considered for dose increase. - Increase gabapentin  dosage gradually: one in the morning, one midday, two in the evening. Adjust based on tolerance and effectiveness. - Monitor for drowsiness from increased gabapentin . - Encourage updates every 3-4 weeks on medication tolerance and effectiveness. - Adjust recliner position to reduce leg swelling. - Continue knee support stockings as needed.  Atrial fibrillation, status post cardioversion Atrial fibrillation post-cardioversion. Ablation considered if recurrence occurs. - Schedule video conference with cardiologist on September 9th. - Discuss potential ablation if atrial fibrillation recurs.  Thrombocytopenia, under evaluation Thrombocytopenia under evaluation. Recent platelet counts pending. - Order blood work to check platelet levels within two weeks. - Monitor platelet levels.  Chronic neck pain, status post cervical fusion Chronic neck pain post-cervical fusion with stiffness and discomfort. - Try different recliner positions to alleviate neck pain.  General Health Maintenance Due for bone density test and routine blood work. - Schedule bone density test. - Order routine blood work for B12, sugar, cholesterol, kidney function, and urine protein.  Follow-up Follow-up scheduled in six months. Monthly updates on gabapentin  and health status encouraged. - Schedule follow-up in six months. - Encourage monthly updates on gabapentin  adjustment and health status.  On anticoagulation due to atrial fibs  Has morbid obesity watching portions trying to stay active  History of B12 deficiency take oral B12 check level  History of elevated serum creatinine need to do additional lab work and evaluation  History of cervical myelopathy he is disabled uses a walker  Neuropathic  pain increase gabapentin  patient to give us  feedback within 3 weeks regarding how that is doing  Blood pressure good control No sign  of heart failure going on currently seems to be well compensated continue current medications  Follow-up 6 months

## 2024-07-09 DIAGNOSIS — I480 Paroxysmal atrial fibrillation: Secondary | ICD-10-CM | POA: Diagnosis not present

## 2024-07-09 DIAGNOSIS — I2510 Atherosclerotic heart disease of native coronary artery without angina pectoris: Secondary | ICD-10-CM | POA: Diagnosis not present

## 2024-07-12 ENCOUNTER — Ambulatory Visit: Payer: Medicare Other

## 2024-07-12 VITALS — Ht 73.0 in | Wt 282.0 lb

## 2024-07-12 DIAGNOSIS — Z0000 Encounter for general adult medical examination without abnormal findings: Secondary | ICD-10-CM

## 2024-07-12 NOTE — Progress Notes (Signed)
 Subjective:   [name] is a 73 y.o. who presents for a Medicare Wellness preventive visit.  As a reminder, Annual Wellness Visits don't include a physical exam, and some assessments may be limited, especially if this visit is performed virtually. We may recommend an in-person follow-up visit with your provider if needed.  Visit Complete: Virtual I connected with  HAVARD RADIGAN on 07/12/24 by a audio enabled telemedicine application and verified that I am speaking with the correct person using two identifiers.  Patient Location: Home  Provider Location: Home Office  I discussed the limitations of evaluation and management by telemedicine. The patient expressed understanding and agreed to proceed.  Vital Signs: Because this visit was a virtual/telehealth visit, some criteria may be missing or patient reported. Any vitals not documented were not able to be obtained and vitals that have been documented are patient reported.  VideoDeclined- This patient declined Librarian, academic. Therefore the visit was completed with audio only.  Persons Participating in Visit: Patient.  AWV Questionnaire: No: Patient Medicare AWV questionnaire was not completed prior to this visit.  Cardiac Risk Factors include: advanced age (>48men, >68 women);dyslipidemia;male gender;hypertension     Objective:    Today's Vitals   07/12/24 1234  Weight: 282 lb (127.9 kg)  Height: 6' 1 (1.854 m)   Body mass index is 37.21 kg/m.     07/12/2024   12:42 PM 01/02/2024    8:44 PM 07/07/2023    1:05 PM 06/28/2022    2:51 PM 04/01/2022    3:21 PM 03/23/2022    3:14 PM 09/14/2021    2:47 PM  Advanced Directives  Does Patient Have a Medical Advance Directive? No No No No Yes Yes Yes  Type of Primary school teacher of [place];Living will Healthcare Power of Attorney  Does patient want to make changes to medical advance directive?     No -  Patient declined No - Patient declined No - Patient declined  Copy of Healthcare Power of Attorney in Chart?     No - copy requested No - copy requested No - copy requested  Would patient like information on creating a medical advance directive? Yes (MAU/Ambulatory/Procedural Areas - Information given)  No - Patient declined No - Patient declined   No - Patient declined    Current Medications (verified) Outpatient Encounter Medications as of 07/12/2024  Medication Sig   acetaminophen  (TYLENOL ) 325 MG tablet Take 2 tablets (650 mg total) by mouth every 6 (six) hours as needed for mild pain (or Fever >/= 101). (Patient taking differently: Take 1,000 mg by mouth every 6 (six) hours as needed for mild pain (pain score 1-3) (or Fever >/= 101).)   alendronate  (FOSAMAX ) 70 MG tablet TAKE 1 TABLET(70 MG) BY MOUTH EVERY 7 DAYS WITH A FULL GLASS OF WATER  AND ON AN EMPTY STOMACH   amiodarone  (PACERONE ) 200 MG tablet daily.   ascorbic acid (VITAMIN C) 500 MG tablet Take by mouth daily.   baclofen  (LIORESAL ) 10 MG tablet Take 1 tablet (10 mg total) by mouth 3 (three) times daily as needed.   cetirizine (ZYRTEC) 10 MG tablet Take by mouth.   cholecalciferol  (VITAMIN D3) 25 MCG (1000 UNIT) tablet Take 1,000 Units by mouth daily.   ezetimibe  (ZETIA ) 10 MG tablet TAKE 1 TABLET(10 MG) BY MOUTH DAILY   fluticasone  (FLONASE ) 50 MCG/ACT nasal spray Place 1 spray into both nostrils 2 (two) times  daily.   gabapentin  (NEURONTIN ) 100 MG capsule 1 pill in the morning 1 mid day and 2 pills each evening   JARDIANCE 10 MG TABS tablet Take 10 mg by mouth daily.   levothyroxine  (SYNTHROID ) 100 MCG tablet Take 1 tablet (100 mcg total) by mouth daily before breakfast.   lisinopril  (ZESTRIL ) 10 MG tablet Take 1 tablet (10 mg total) by mouth daily.   metoprolol  succinate (TOPROL -XL) 25 MG 24 hr tablet Take 1 tablet (25 mg total) by mouth 2 (two) times daily.   Multiple Vitamin (MULTIVITAMIN) capsule Take 1 capsule by mouth daily.    nitroGLYCERIN  (NITROSTAT ) 0.4 MG SL tablet PLACE 1 TABLET UNDER THE TONGUE EVERY 5 MINUTES AS NEEDED FOR CHEST PAINS. Strength: 0.4 mg   pantoprazole  (PROTONIX ) 40 MG tablet Take 1 tablet (40 mg total) by mouth daily.   promethazine -dextromethorphan (PROMETHAZINE -DM) 6.25-15 MG/5ML syrup Take 5 mLs by mouth 4 (four) times daily as needed.   rosuvastatin  (CRESTOR ) 40 MG tablet TAKE 1 TABLET BY MOUTH EVERY EVENING   sertraline  (ZOLOFT ) 100 MG tablet TAKE ONE TABLET BY MOUTH ONCE A DAY   tamsulosin  (FLOMAX ) 0.4 MG CAPS capsule TAKE 2 CAPSULES BY MOUTH AT NIGHT   torsemide  (DEMADEX ) 20 MG tablet 1 to 2 each morning as needed (Patient taking differently: 1/2 tab midday)   Facility-Administered Encounter Medications as of 07/12/2024  Medication   cyanocobalamin  (VITAMIN B12) injection 1,000 mcg    Allergies (verified) Other, Penicillins, Sulfa antibiotics, Iodinated contrast media, and Zoloft  [sertraline  hcl]   History: Past Medical History:  Diagnosis Date   Allergy    Carpal tunnel syndrome    Chronic back pain    Chronic combined systolic and diastolic congestive heart failure (HCC)    Chronic ITP (idiopathic thrombocytopenia) (HCC) 05/25/2015   Coronary artery disease    Coronary artery disease involving native coronary artery of native heart with angina pectoris (HCC)    Degenerative disc disease    Epidural hematoma (HCC)    C3-4 ACDF 07/19/21, presented 07/22/21 with BLE/UE weakness/epidural hematoma, s/p revision & hematoma evacuation   GERD (gastroesophageal reflux disease)    History of hiatal hernia    History of thrombocytopenia    Hypercholesteremia    Hypertension    Hypothyroid    Lumbar pain    Myocardial infarction (HCC) 2009   Obesity    Pneumonia    around age 36   S/P CABG x 3 04/27/2018   LIMA to LAD SVG to OM1 SVG to OM2   Shortness of breath dyspnea    Past Surgical History:  Procedure Laterality Date   ANTERIOR CERVICAL DECOMP/DISCECTOMY FUSION N/A  07/19/2021   Procedure: CERVICAL THREE-FOUR ANTERIOR CERVICAL DECOMPRESSION/DISCECTOMY FUSION;  Surgeon: Onetha Kuba, MD;  Location: [facility] OR;  Service: Neurosurgery;  Laterality: N/A;   ANTERIOR CERVICAL DECOMP/DISCECTOMY FUSION N/A 07/22/2021   Procedure: ANTERIOR CERVICAL DECOMPRESSION/DISCECTOMY REVISON FUSION  WITH EVACUATION OF EPIDURAL HEMATOMA;  Surgeon: Carollee Lani BROCKS, DO;  Location: [facility] OR;  Service: Neurosurgery;  Laterality: N/A;   BACK SURGERY     5 lumbas disc with cervical and lumbar fusions   BIOPSY N/A 03/06/2014   Procedure: ESOPHAGEAL BIOPSIES;  Surgeon: Claudis RAYMOND Rivet, MD;  Location: [facility] ORS;  Service: Endoscopy;  Laterality: N/A;   COLONOSCOPY     COLONOSCOPY WITH PROPOFOL  N/A 03/06/2014   Procedure: COLONOSCOPY WITH PROPOFOL ;  Surgeon: Claudis RAYMOND Rivet, MD;  Location: [facility] ORS;  Service: Endoscopy;  Laterality: N/A;  in cecum at 0807; total withdrawal  time 9 minutes   CORONARY ANGIOPLASTY WITH STENT PLACEMENT     2000, and 2004 has 3 stents   CORONARY ARTERY BYPASS GRAFT N/A 04/27/2018   Procedure: CORONARY ARTERY BYPASS GRAFTING (CABG) x Three , using left internal mammary artery and right leg greater saphenous vein;  Surgeon: Dusty Sudie DEL, MD;  Location: [facility] OR;  Service: Open Heart Surgery;  Laterality: N/A;   ESOPHAGOGASTRODUODENOSCOPY (EGD) WITH PROPOFOL  N/A 03/06/2014   Procedure: ESOPHAGOGASTRODUODENOSCOPY (EGD) WITH PROPOFOL ;  Surgeon: Claudis RAYMOND Rivet, MD;  Location: [facility] ORS;  Service: Endoscopy;  Laterality: N/A;   EYE SURGERY Right 2013   lazy eye   JOINT REPLACEMENT Left 2011   LEFT HEART CATH AND CORONARY ANGIOGRAPHY N/A 04/24/2018   Procedure: LEFT HEART CATH AND CORONARY ANGIOGRAPHY;  Surgeon: Dann Candyce RAMAN, MD;  Location[facility] INVASIVE CV LAB;  Service: Cardiovascular;  Laterality: N/A;   LUMBAR FUSION  11/01/2007   MALONEY DILATION N/A 03/06/2014   Procedure: MALONEY DILATION 54 french;  Surgeon: Claudis RAYMOND Rivet, MD;  Location: [facility] ORS;  Service:  Endoscopy;  Laterality: N/A;   NECK SURGERY     POSTERIOR CERVICAL FUSION/FORAMINOTOMY N/A 04/01/2022   Procedure: Post Cervical Lami/Multi level - C3-C4 - C4-C5 and fusion with lateral mass screws and posterolateral arthrodesis;  Surgeon: Onetha Kuba, MD;  Location: [facility] OR;  Service: Neurosurgery;  Laterality: N/A;   TEE WITHOUT CARDIOVERSION N/A 04/27/2018   Procedure: TRANSESOPHAGEAL ECHOCARDIOGRAM (TEE);  Surgeon: Dusty Sudie DEL, MD;  Location[facility] OR;  Service: Open Heart Surgery;  Laterality: N/A;   Family History  Problem Relation Age of Onset   Heart attack Father    Heart disease Father    Colon cancer Maternal Grandmother    Esophageal cancer Neg Hx    Rectal cancer Neg Hx    Stomach cancer Neg Hx    Sleep apnea Neg Hx    Social History   Socioeconomic History   Marital status: Married    Spouse name: Not on file   Number of children: Not on file   Years of education: Not on file   Highest education level: Not on file  Occupational History   Occupation: retireed  Tobacco Use   Smoking status: Former    Current packs/day: 0.00    Average packs/day: 2.0 packs/day for 20.0 years (40.0 ttl pk-yrs)    Types: Cigarettes    Start date: 07/06/1971    Quit date: 07/06/1991    Years since quitting: 33.0   Smokeless tobacco: Never  Vaping Use   Vaping status: Never Used  Substance and Sexual Activity   Alcohol use: Yes    Comment: occassional   Drug use: No   Sexual activity: Not Currently    Birth control/protection: None  Other Topics Concern   Not on file  Social History Narrative   Disabled due to chronic back pain   Social Drivers of Health   Financial Resource Strain: Low Risk  (07/12/2024)   Overall Financial Resource Strain (CARDIA)    Difficulty of Paying Living Expenses: Not hard at all  Food Insecurity: No Food Insecurity (07/12/2024)   Hunger Vital Sign    Worried About Running Out of Food in the Last Year: Never true    Ran Out of Food in the Last Year:  Never true  Transportation Needs: No Transportation Needs (07/12/2024)   PRAPARE - Administrator, Civil Service (Medical): No    Lack of Transportation (Non-Medical): No  Physical Activity:  Inactive (07/12/2024)   Exercise Vital Sign    Days of Exercise per Week: 0 days    Minutes of Exercise per Session: 0 min  Stress: Stress Concern Present (07/12/2024)   [employer] of Occupational Health - Occupational Stress Questionnaire    Feeling of Stress: To some extent  Social Connections: Moderately Integrated (07/12/2024)   Social Connection and Isolation Panel    Frequency of Communication with Friends and Family: More than three times a week    Frequency of Social Gatherings with Friends and Family: More than three times a week    Attends Religious Services: More than 4 times per year    Active Member of [employer] or Organizations: No    Attends Banker Meetings: Never    Marital Status: Married    Tobacco Counseling Counseling given: Not Answered    Clinical Intake:  Pre-visit preparation completed: Yes  Pain : No/denies pain  Diabetes: No  Lab Results  Component Value Date   HGBA1C 5.5 03/03/2023   HGBA1C 5.6 04/25/2018   HGBA1C 5.2 02/03/2017     How often do you need to have someone help you when you read instructions, pamphlets, or other written materials from your doctor or pharmacy?: 1 - Never  Interpreter Needed?: No  Information entered by :: Charmaine Bloodgood LPN   Activities of Daily Living     07/12/2024   12:37 PM  In your present state of health, do you have any difficulty performing the following activities:  Hearing? 0  Vision? 0  Difficulty concentrating or making decisions? 0  Walking or climbing stairs? 1  Dressing or bathing? 0  Doing errands, shopping? 1  Preparing Food and eating ? N  Using the Toilet? N  In the past six months, have you accidently leaked urine? N  Do you have problems with loss of bowel control? N   Managing your Medications? N  Managing your Finances? N  Housekeeping or managing your Housekeeping? Y    Patient Care Team: Alphonsa Glendia LABOR, MD as PCP - General (Family Medicine) Delford Maude BROCKS, MD as PCP - Cardiology (Cardiology) Legrand Victory LITTIE MOULD, MD as Consulting Physician (Gastroenterology) Sheldon Standing, MD as Consulting Physician (General Surgery) Delford Maude BROCKS, MD as Consulting Physician (Cardiology) Amadeo Windell SAILOR, MD (Inactive) as Consulting Physician (Hematology and Oncology) Forrest Mooring, MD as Referring Physician (Nephrology) Rocco Mike, MD as Referring Physician (Cardiology) Tiffany Lamar Rush, MD as Referring Physician (Cardiology) Onetha Kuba, MD as Consulting Physician (Neurosurgery) Shona Rush, MD (Dermatology) Buck Saucer, MD as Attending Physician (Neurology)  I have updated your Care Teams any recent Medical Services you may have received from other providers in the past year.     Assessment:   This is a routine wellness examination for [place].  Hearing/Vision screen Hearing Screening - Comments:: Denies hearing difficulties   Vision Screening - Comments:: Wears rx glasses - up to date with routine eye exams with [facility]    Goals Addressed             This Visit's Progress    Patient Stated   On track    Stay in good health       Depression Screen     07/12/2024   12:41 PM 06/24/2024    1:16 PM 01/03/2024    3:23 PM 12/26/2023    9:26 AM 07/07/2023    1:07 PM 05/08/2023   11:39 AM 08/09/2022    3:09 PM  PHQ  2/9 Scores  PHQ - 2 Score 0 0 0 1 4 5 2   PHQ- 9 Score 4 5 0 4 6 12 7     Fall Risk     07/12/2024   12:42 PM 06/24/2024    1:15 PM 01/03/2024    3:23 PM 12/26/2023    9:26 AM 07/07/2023    1:06 PM  Fall Risk   Falls in the past year? 1 1 1 1 1   Number falls in past yr: 0 0 1 1 1   Injury with Fall? 0 0 0 0 1  Risk for fall due to : History of fall(s);Impaired balance/gait;Impaired mobility    History of fall(s);Impaired  balance/gait;Orthopedic patient  Follow up Education provided;Falls prevention discussed;Falls evaluation completed    Education provided;Falls prevention discussed    MEDICARE RISK AT HOME:  Medicare Risk at Home Any stairs in or around the home?: No If so, are there any without handrails?: No Home free of loose throw rugs in walkways, pet beds, electrical cords, etc?: Yes Adequate lighting in your home to reduce risk of falls?: Yes Life alert?: No Use of a cane, walker or w/c?: Yes Grab bars in the bathroom?: Yes Shower chair or bench in shower?: No Elevated toilet seat or a handicapped toilet?: Yes  TIMED UP AND GO:  Was the test performed?  No  Cognitive Function: 6CIT completed        07/12/2024   12:43 PM 07/07/2023    1:06 PM 06/28/2022    2:54 PM  6CIT Screen  What Year? 0 points 0 points 0 points  What month? 0 points 0 points 0 points  What time? 0 points 0 points 0 points  Count back from 20 0 points 0 points 0 points  Months in reverse 0 points 0 points 0 points  Repeat phrase 0 points 0 points 0 points  Total Score 0 points 0 points 0 points    Immunizations Immunization History  Administered Date(s) Administered   Fluad Quad(high Dose 65+) 08/09/2022   Fluad Trivalent(High Dose 65+) 08/04/2023   Influenza Split 08/29/2013   Influenza,inj,Quad PF,6+ Mos 08/29/2014, 08/13/2015, 06/30/2017, 07/27/2019   Influenza-Unspecified 08/30/2004, 10/28/2016, 10/06/2018, 08/14/2020   Moderna Sars-Covid-2 Vaccination 01/11/2020, 02/11/2020   PNEUMOCOCCAL CONJUGATE-20 01/05/2022   Pneumococcal Polysaccharide-23 07/16/2007, 04/02/2020   Td 08/13/2021    Screening Tests Health Maintenance  Topic Date Due   Zoster Vaccines- Shingrix (1 of 2) Never done   COVID-19 Vaccine (3 - Moderna risk series) 03/10/2020   Influenza Vaccine  05/31/2024   Medicare Annual Wellness (AWV)  07/12/2025   Colonoscopy  03/18/2027   DTaP/Tdap/Td (2 - Tdap) 08/14/2031   Pneumococcal  Vaccine: 50+ Years  Completed   Hepatitis C Screening  Completed   HPV VACCINES  Aged Out   Meningococcal B Vaccine  Aged Out    Health Maintenance Items Addressed: Information provided on vaccine recommendations   Additional Screening:  Vision Screening: Recommended annual ophthalmology exams for early detection of glaucoma and other disorders of the eye. Is the patient up to date with their annual eye exam?  Yes  Who is the provider or what is the name of the office in which the patient attends annual eye exams? [facility]   Dental Screening: Recommended annual dental exams for proper oral hygiene  Community Resource Referral / Chronic Care Management: CRR required this visit?  No   CCM required this visit?  No   Plan:    I have personally reviewed  and noted the following in the patient's chart:   Medical and social history Use of alcohol, tobacco or illicit drugs  Current medications and supplements including opioid prescriptions. Patient is not currently taking opioid prescriptions. Functional ability and status Nutritional status Physical activity Advanced directives List of other physicians Hospitalizations, surgeries, and ER visits in previous 12 months Vitals Screenings to include cognitive, depression, and falls Referrals and appointments  In addition, I have reviewed and discussed with patient certain preventive protocols, quality metrics, and best practice recommendations. A written personalized care plan for preventive services as well as general preventive health recommendations were provided to patient.   Lavelle Pfeiffer [place], [place]   [id number]   After Visit Summary: (MyChart) Due to this being a telephonic visit, the after visit summary with patients personalized plan was offered to patient via MyChart   Notes: Nothing significant to report at this time.

## 2024-07-12 NOTE — Patient Instructions (Signed)
 [name],  Thank you for taking the time for your Medicare Wellness Visit. I appreciate your continued commitment to your health goals. Please review the care plan we discussed, and feel free to reach out if I can assist you further.  Medicare recommends these wellness visits once per year to help you and your care team stay ahead of potential health issues. These visits are designed to focus on prevention, allowing your provider to concentrate on managing your acute and chronic conditions during your regular appointments.  Please note that Annual Wellness Visits do not include a physical exam. Some assessments may be limited, especially if the visit was conducted virtually. If needed, we may recommend a separate in-person follow-up with your provider.  Ongoing Care Seeing your primary care provider every 3 to 6 months helps us  monitor your health and provide consistent, personalized care.   Referrals If a referral was made during today's visit and you haven't received any updates within two weeks, please contact the referred provider directly to check on the status.  Recommended Screenings:  Health Maintenance  Topic Date Due   Zoster (Shingles) Vaccine (1 of 2) Never done   COVID-19 Vaccine (3 - Moderna risk series) 03/10/2020   Flu Shot  05/31/2024   Medicare Annual Wellness Visit  07/12/2025   Colon Cancer Screening  03/18/2027   DTaP/Tdap/Td vaccine (2 - Tdap) 08/14/2031   Pneumococcal Vaccine for age over 58  Completed   Hepatitis C Screening  Completed   HPV Vaccine  Aged Out   Meningitis B Vaccine  Aged Out       07/12/2024   12:42 PM  Advanced Directives  Does Patient Have a Medical Advance Directive? No  Would patient like information on creating a medical advance directive? Yes (MAU/Ambulatory/Procedural Areas - Information given)   Advance Care Planning is important because it: Ensures you receive medical care that aligns with your values, goals, and  preferences. Provides guidance to your family and loved ones, reducing the emotional burden of decision-making during critical moments.  Information on Advanced Care Planning can be found at [place]  Secretary of [facility] Advance Health Care Directives Advance Health Care Directives ([email or web address]   Vision: Annual vision screenings are recommended for early detection of glaucoma, cataracts, and diabetic retinopathy. These exams can also reveal signs of chronic conditions such as diabetes and high blood pressure.  Dental: Annual dental screenings help detect early signs of oral cancer, gum disease, and other conditions linked to overall health, including heart disease and diabetes.  Please see the attached documents for additional preventive care recommendations.

## 2024-07-19 ENCOUNTER — Other Ambulatory Visit: Payer: Self-pay | Admitting: Family Medicine

## 2024-07-19 DIAGNOSIS — R252 Cramp and spasm: Secondary | ICD-10-CM

## 2024-07-23 ENCOUNTER — Telehealth: Payer: Self-pay | Admitting: Family Medicine

## 2024-07-23 NOTE — Telephone Encounter (Signed)
 Patient's wife dropped off form to be completed in a red folder in your box on the wall

## 2024-07-24 ENCOUNTER — Telehealth: Admitting: Family Medicine

## 2024-07-24 DIAGNOSIS — J208 Acute bronchitis due to other specified organisms: Secondary | ICD-10-CM

## 2024-07-24 DIAGNOSIS — B9689 Other specified bacterial agents as the cause of diseases classified elsewhere: Secondary | ICD-10-CM | POA: Diagnosis not present

## 2024-07-24 MED ORDER — ALBUTEROL SULFATE HFA 108 (90 BASE) MCG/ACT IN AERS: 1.0000 | INHALATION_SPRAY | Freq: Four times a day (QID) | RESPIRATORY_TRACT | 0 refills | Status: AC | PRN

## 2024-07-24 MED ORDER — DOXYCYCLINE HYCLATE 100 MG PO TABS: 100.0000 mg | ORAL_TABLET | Freq: Two times a day (BID) | ORAL | 0 refills | Status: AC

## 2024-07-24 MED ORDER — BENZONATATE 100 MG PO CAPS: 100.0000 mg | ORAL_CAPSULE | Freq: Three times a day (TID) | ORAL | 0 refills | Status: AC | PRN

## 2024-07-24 NOTE — Patient Instructions (Addendum)
 [name], thank you for joining Oscar CHRISTELLA Barefoot, NP for today's virtual visit.  While this provider is not your primary care provider (PCP), if your PCP is located in our provider database this encounter information will be shared with them immediately following your visit.   A [facility] MyChart account gives you access to today's visit and all your visits, tests, and labs performed at [facility]  click here if you don't have a [facility] MyChart account or go to [email or web address]  Consent: (Patient) [name] provided verbal consent for this virtual visit at the beginning of the encounter.  Current Medications:  Current Outpatient Medications:    albuterol  (VENTOLIN  HFA) 108 (90 Base) MCG/ACT inhaler, Inhale 1-2 puffs into the lungs every 6 (six) hours as needed for wheezing or shortness of breath (cough)., Disp: 8 g, Rfl: 0   benzonatate  (TESSALON ) 100 MG capsule, Take 1 capsule (100 mg total) by mouth 3 (three) times daily as needed for cough., Disp: 30 capsule, Rfl: 0   doxycycline  (VIBRA -TABS) 100 MG tablet, Take 1 tablet (100 mg total) by mouth 2 (two) times daily for 7 days., Disp: 14 tablet, Rfl: 0   acetaminophen  (TYLENOL ) 325 MG tablet, Take 2 tablets (650 mg total) by mouth every 6 (six) hours as needed for mild pain (or Fever >/= 101). (Patient taking differently: Take 1,000 mg by mouth every 6 (six) hours as needed for mild pain (pain score 1-3) (or Fever >/= 101).), Disp: , Rfl:    alendronate  (FOSAMAX ) 70 MG tablet, TAKE 1 TABLET(70 MG) BY MOUTH EVERY 7 DAYS WITH A FULL GLASS OF WATER  AND ON AN EMPTY STOMACH, Disp: 4 tablet, Rfl: 11   amiodarone  (PACERONE ) 200 MG tablet, daily., Disp: , Rfl:    ascorbic acid (VITAMIN C) 500 MG tablet, Take by mouth daily., Disp: , Rfl:    baclofen  (LIORESAL ) 10 MG tablet, TAKE 1 TABLET BY MOUTH THREE TIMES DAILY AS NEEDED, Disp: 90 tablet, Rfl: 5   cetirizine (ZYRTEC) 10 MG tablet, Take by mouth., Disp: , Rfl:     cholecalciferol  (VITAMIN D3) 25 MCG (1000 UNIT) tablet, Take 1,000 Units by mouth daily., Disp: , Rfl:    ezetimibe  (ZETIA ) 10 MG tablet, TAKE 1 TABLET(10 MG) BY MOUTH DAILY, Disp: 90 tablet, Rfl: 3   fluticasone  (FLONASE ) 50 MCG/ACT nasal spray, Place 1 spray into both nostrils 2 (two) times daily., Disp: 16 g, Rfl: 2   gabapentin  (NEURONTIN ) 100 MG capsule, 1 pill in the morning 1 mid day and 2 pills each evening, Disp: 360 capsule, Rfl: 5   JARDIANCE 10 MG TABS tablet, Take 10 mg by mouth daily., Disp: , Rfl:    levothyroxine  (SYNTHROID ) 100 MCG tablet, Take 1 tablet (100 mcg total) by mouth daily before breakfast., Disp: 90 tablet, Rfl: 3   lisinopril  (ZESTRIL ) 10 MG tablet, Take 1 tablet (10 mg total) by mouth daily., Disp: 90 tablet, Rfl: 3   metoprolol  succinate (TOPROL -XL) 25 MG 24 hr tablet, Take 1 tablet (25 mg total) by mouth 2 (two) times daily., Disp: 180 tablet, Rfl: 1   Multiple Vitamin (MULTIVITAMIN) capsule, Take 1 capsule by mouth daily., Disp: , Rfl:    nitroGLYCERIN  (NITROSTAT ) 0.4 MG SL tablet, PLACE 1 TABLET UNDER THE TONGUE EVERY 5 MINUTES AS NEEDED FOR CHEST PAINS. Strength: 0.4 mg, Disp: 25 tablet, Rfl: 3   pantoprazole  (PROTONIX ) 40 MG tablet, Take 1 tablet (40 mg total) by mouth daily., Disp: 90 tablet, Rfl: 1  promethazine -dextromethorphan (PROMETHAZINE -DM) 6.25-15 MG/5ML syrup, Take 5 mLs by mouth 4 (four) times daily as needed., Disp: 118 mL, Rfl: 0   rosuvastatin  (CRESTOR ) 40 MG tablet, TAKE 1 TABLET BY MOUTH EVERY EVENING, Disp: 90 tablet, Rfl: 1   sertraline  (ZOLOFT ) 100 MG tablet, TAKE ONE TABLET BY MOUTH ONCE A DAY, Disp: 90 tablet, Rfl: 1   tamsulosin  (FLOMAX ) 0.4 MG CAPS capsule, TAKE 2 CAPSULES BY MOUTH AT NIGHT, Disp: 180 capsule, Rfl: 2   torsemide  (DEMADEX ) 20 MG tablet, 1 to 2 each morning as needed (Patient taking differently: 1/2 tab midday), Disp: 180 tablet, Rfl: 1  Current Facility-Administered Medications:    cyanocobalamin  (VITAMIN B12) injection  1,000 mcg, 1,000 mcg, Intramuscular, Once, [name], Scott A, MD   Medications ordered in this encounter:  Meds ordered this encounter  Medications   doxycycline  (VIBRA -TABS) 100 MG tablet    Sig: Take 1 tablet (100 mg total) by mouth 2 (two) times daily for 7 days.    Dispense:  14 tablet    Refill:  0    Supervising Provider:   LAMPTEY, PHILIP O [[id number]]   benzonatate  (TESSALON ) 100 MG capsule    Sig: Take 1 capsule (100 mg total) by mouth 3 (three) times daily as needed for cough.    Dispense:  30 capsule    Refill:  0    Supervising Provider:   LAMPTEY, PHILIP O [[id number]]   albuterol  (VENTOLIN  HFA) 108 (90 Base) MCG/ACT inhaler    Sig: Inhale 1-2 puffs into the lungs every 6 (six) hours as needed for wheezing or shortness of breath (cough).    Dispense:  8 g    Refill:  0    Supervising Provider:   BLAISE ALEENE KIDD [[id number]]     *If you need refills on other medications prior to your next appointment, please contact your pharmacy*  Follow-Up: Call back or seek an in-person evaluation if the symptoms worsen or if the condition fails to improve as anticipated.  [facility] [phone]  Other Instructions   - Take meds as prescribed - Rest voice - Use a cool mist humidifier especially during the winter months when heat dries out the air. - Use saline nose sprays frequently to help soothe nasal passages if they are drying out. - Stay hydrated by drinking plenty of fluids - Keep thermostat turn down low to prevent drying out which can cause a dry cough.   If you do not improve you will need a follow up visit in person.    If you have been instructed to have an in-person evaluation today at a local Urgent Care facility, please use the link below. It will take you to a list of all of our available [facility], including address, phone number and hours of operation. Please do not delay care.  [facility]  If you or a family member  do not have a primary care provider, use the link below to schedule a visit and establish care. When you choose a [facility] primary care physician or advanced practice provider, you gain a long-term partner in health. Find a Primary Care Provider  Learn more about [facility] in-office and virtual care options: [facility] - Get Care Now

## 2024-07-24 NOTE — Progress Notes (Signed)
 Virtual Visit Consent   [name], you are scheduled for a virtual visit with a [facility] provider today. Just as with appointments in the office, your consent must be obtained to participate. Your consent will be active for this visit and any virtual visit you may have with one of our providers in the next 365 days. If you have a MyChart account, a copy of this consent can be sent to you electronically.  As this is a virtual visit, video technology does not allow for your provider to perform a traditional examination. This may limit your provider's ability to fully assess your condition. If your provider identifies any concerns that need to be evaluated in person or the need to arrange testing (such as labs, EKG, etc.), we will make arrangements to do so. Although advances in technology are sophisticated, we cannot ensure that it will always work on either your end or our end. If the connection with a video visit is poor, the visit may have to be switched to a telephone visit. With either a video or telephone visit, we are not always able to ensure that we have a secure connection.  By engaging in this virtual visit, you consent to the provision of healthcare and authorize for your insurance to be billed (if applicable) for the services provided during this visit. Depending on your insurance coverage, you may receive a charge related to this service.  I need to obtain your verbal consent now. Are you willing to proceed with your visit today? [name] has provided verbal consent on 07/24/2024 for a virtual visit (video or telephone). Chiquita CHRISTELLA Barefoot, NP  Date: 07/24/2024 9:48 AM   Virtual Visit via Video Note   I, Chiquita CHRISTELLA Barefoot, connected with  [name]  ([id number], Jun 22, 1951) on 07/24/24 at 10:00 AM EDT by a video-enabled telemedicine application and verified that I am speaking with the correct person using two identifiers.  Location: Patient: Virtual Visit Location Patient:  Home Provider: Virtual Visit Location Provider: Home Office   I discussed the limitations of evaluation and management by telemedicine and the availability of in person appointments. The patient expressed understanding and agreed to proceed.    History of Present Illness: [name] is a 73 y.o. who identifies as a male who was assigned male at birth, and is being seen today for cold symptoms.  Onset was over the weekend- runny eyes, nose, coughing Associated symptoms are coughing all night long, mucus with cough, fever- 101,  Modifying factors are allergy medication- zytrec daily  Denies chest pain, shortness of breath  Exposure to sick contacts- known with grandson sick recently with cold  COVID test: none  Problems:  Patient Active Problem List   Diagnosis Date Noted   Influenza A 01/03/2024   SOB (shortness of breath) 09/01/2023   Presence of Watchman left atrial appendage closure device 08/07/2023   UTI (urinary tract infection) 04/30/2023   B12 deficiency 04/28/2023   CAD (coronary artery disease) 04/27/2023   Falls frequently 04/27/2023   Heart failure with recovered ejection fraction (HFrecEF) (HCC) 04/27/2023   History of ITP 04/27/2023   Ischemic cardiomyopathy 04/27/2023   Neuropathy 04/27/2023   PAF (paroxysmal atrial fibrillation) (HCC) 04/27/2023   Myelopathy concurrent with and due to spinal stenosis of cervical region (HCC) 04/01/2022   Spasticity 12/10/2021   Foot drop, bilateral 12/10/2021   Wheelchair dependence 12/10/2021   Nerve pain 12/10/2021   Cervical myelopathy (HCC)    Neurogenic  bladder    S/P CABG x 3 04/27/2018   Obesity, morbid (HCC)    Peripheral edema 06/30/2017   Thrombocytopenia 05/25/2015   Chronic ITP (idiopathic thrombocytopenia) (HCC) 05/25/2015   Hypothyroidism 08/29/2014   Hyperlipidemia 11/18/2013   Mixed hyperlipidemia 07/06/2011   Chronic back pain 07/23/2008   Essential hypertension 10/09/2007    Allergies:  Allergies   Allergen Reactions   Other Swelling    SWELLING REACTION UNSPECIFIED > PER PREVIOUS PMH   Penicillins Other (See Comments)    SYNCOPAL EPISODE  Has patient had a PCN reaction causing immediate rash, facial/tongue/throat swelling, SOB or LIGHTHEADEDNESS with HYPOTENSION [SYNCOPE] #  #  #  YES  #  #  #   Has patient had a PCN reaction causing severe rash involving mucus membranes or skin necrosis:No  Has patient had a PCN reaction that required hospitalization:No  Has patient had a PCN reaction occurring within the last 10 years:Yes  SYNCOPAL EPISODE  Has patient had a PCN reaction causing immediate rash, facial/tongue/throat swelling, SOB or LIGHTHEADEDNESS with HYPOTENSION [SYNCOPE] #  #  #  YES  #  #  #  Has patient had a PCN reaction causing severe rash involving mucus membranes or skin necrosis:No Has patient had a PCN reaction that required hospitalization:No Has patient had a PCN reaction occurring within the last 10 years:Yes   Sulfa Antibiotics Swelling    SWELLING REACTION UNSPECIFIED > PER PREVIOUS PMH   Iodinated Contrast Media Hives and Itching   Zoloft  [Sertraline  Hcl] Other (See Comments)    Confusion Patient is taking Down    Medications:  Current Outpatient Medications:    acetaminophen  (TYLENOL ) 325 MG tablet, Take 2 tablets (650 mg total) by mouth every 6 (six) hours as needed for mild pain (or Fever >/= 101). (Patient taking differently: Take 1,000 mg by mouth every 6 (six) hours as needed for mild pain (pain score 1-3) (or Fever >/= 101).), Disp: , Rfl:    alendronate  (FOSAMAX ) 70 MG tablet, TAKE 1 TABLET(70 MG) BY MOUTH EVERY 7 DAYS WITH A FULL GLASS OF WATER  AND ON AN EMPTY STOMACH, Disp: 4 tablet, Rfl: 11   amiodarone  (PACERONE ) 200 MG tablet, daily., Disp: , Rfl:    ascorbic acid (VITAMIN C) 500 MG tablet, Take by mouth daily., Disp: , Rfl:    baclofen  (LIORESAL ) 10 MG tablet, TAKE 1 TABLET BY MOUTH THREE TIMES DAILY AS NEEDED, Disp: 90 tablet, Rfl: 5    cetirizine (ZYRTEC) 10 MG tablet, Take by mouth., Disp: , Rfl:    cholecalciferol  (VITAMIN D3) 25 MCG (1000 UNIT) tablet, Take 1,000 Units by mouth daily., Disp: , Rfl:    ezetimibe  (ZETIA ) 10 MG tablet, TAKE 1 TABLET(10 MG) BY MOUTH DAILY, Disp: 90 tablet, Rfl: 3   fluticasone  (FLONASE ) 50 MCG/ACT nasal spray, Place 1 spray into both nostrils 2 (two) times daily., Disp: 16 g, Rfl: 2   gabapentin  (NEURONTIN ) 100 MG capsule, 1 pill in the morning 1 mid day and 2 pills each evening, Disp: 360 capsule, Rfl: 5   JARDIANCE 10 MG TABS tablet, Take 10 mg by mouth daily., Disp: , Rfl:    levothyroxine  (SYNTHROID ) 100 MCG tablet, Take 1 tablet (100 mcg total) by mouth daily before breakfast., Disp: 90 tablet, Rfl: 3   lisinopril  (ZESTRIL ) 10 MG tablet, Take 1 tablet (10 mg total) by mouth daily., Disp: 90 tablet, Rfl: 3   metoprolol  succinate (TOPROL -XL) 25 MG 24 hr tablet, Take 1 tablet (25 mg total) by  mouth 2 (two) times daily., Disp: 180 tablet, Rfl: 1   Multiple Vitamin (MULTIVITAMIN) capsule, Take 1 capsule by mouth daily., Disp: , Rfl:    nitroGLYCERIN  (NITROSTAT ) 0.4 MG SL tablet, PLACE 1 TABLET UNDER THE TONGUE EVERY 5 MINUTES AS NEEDED FOR CHEST PAINS. Strength: 0.4 mg, Disp: 25 tablet, Rfl: 3   pantoprazole  (PROTONIX ) 40 MG tablet, Take 1 tablet (40 mg total) by mouth daily., Disp: 90 tablet, Rfl: 1   promethazine -dextromethorphan (PROMETHAZINE -DM) 6.25-15 MG/5ML syrup, Take 5 mLs by mouth 4 (four) times daily as needed., Disp: 118 mL, Rfl: 0   rosuvastatin  (CRESTOR ) 40 MG tablet, TAKE 1 TABLET BY MOUTH EVERY EVENING, Disp: 90 tablet, Rfl: 1   sertraline  (ZOLOFT ) 100 MG tablet, TAKE ONE TABLET BY MOUTH ONCE A DAY, Disp: 90 tablet, Rfl: 1   tamsulosin  (FLOMAX ) 0.4 MG CAPS capsule, TAKE 2 CAPSULES BY MOUTH AT NIGHT, Disp: 180 capsule, Rfl: 2   torsemide  (DEMADEX ) 20 MG tablet, 1 to 2 each morning as needed (Patient taking differently: 1/2 tab midday), Disp: 180 tablet, Rfl: 1  Current  Facility-Administered Medications:    cyanocobalamin  (VITAMIN B12) injection 1,000 mcg, 1,000 mcg, Intramuscular, Once, [name], Scott A, MD  Observations/Objective: Patient is well-developed, well-nourished in no acute distress.  Resting comfortably  at home.  Head is normocephalic, atraumatic.  No labored breathing.  Speech is clear and coherent with logical content.  Patient is alert and oriented at baseline.    Assessment and Plan:   1. Acute bacterial bronchitis (Primary)  - doxycycline  (VIBRA -TABS) 100 MG tablet; Take 1 tablet (100 mg total) by mouth 2 (two) times daily for 7 days.  Dispense: 14 tablet; Refill: 0 - benzonatate  (TESSALON ) 100 MG capsule; Take 1 capsule (100 mg total) by mouth 3 (three) times daily as needed for cough.  Dispense: 30 capsule; Refill: 0 - albuterol  (VENTOLIN  HFA) 108 (90 Base) MCG/ACT inhaler; Inhale 1-2 puffs into the lungs every 6 (six) hours as needed for wheezing or shortness of breath (cough).  Dispense: 8 g; Refill: 0   - Take meds as prescribed - Rest voice - Use a cool mist humidifier especially during the winter months when heat dries out the air. - Use saline nose sprays frequently to help soothe nasal passages if they are drying out. - Stay hydrated by drinking plenty of fluids - Keep thermostat turn down low to prevent drying out which can cause a dry cough.   If you do not improve you will need a follow up visit in person.               Reviewed side effects, risks and benefits of medication.    Patient acknowledged agreement and understanding of the plan.   Past Medical, Surgical, Social History, Allergies, and Medications have been Reviewed.   Follow Up Instructions: I discussed the assessment and treatment plan with the patient. The patient was provided an opportunity to ask questions and all were answered. The patient agreed with the plan and demonstrated an understanding of the instructions.  A copy of instructions were  sent to the patient via MyChart unless otherwise noted below.    The patient was advised to call back or seek an in-person evaluation if the symptoms worsen or if the condition fails to improve as anticipated.    Chiquita CHRISTELLA Barefoot, NP

## 2024-07-27 ENCOUNTER — Other Ambulatory Visit: Payer: Self-pay | Admitting: Family Medicine

## 2024-08-05 ENCOUNTER — Other Ambulatory Visit: Payer: Self-pay | Admitting: Family Medicine

## 2024-08-08 ENCOUNTER — Ambulatory Visit (INDEPENDENT_AMBULATORY_CARE_PROVIDER_SITE_OTHER)

## 2024-08-08 DIAGNOSIS — Z23 Encounter for immunization: Secondary | ICD-10-CM

## 2024-08-28 ENCOUNTER — Encounter: Payer: Self-pay | Admitting: Family Medicine

## 2024-08-28 ENCOUNTER — Other Ambulatory Visit: Payer: Self-pay | Admitting: Family Medicine

## 2024-08-28 MED ORDER — NYSTATIN 100000 UNIT/ML MT SUSP: OROMUCOSAL | 0 refills | Status: DC

## 2024-09-07 ENCOUNTER — Other Ambulatory Visit: Payer: Self-pay | Admitting: Family Medicine

## 2024-09-09 ENCOUNTER — Other Ambulatory Visit: Payer: Self-pay | Admitting: Family Medicine

## 2024-09-11 ENCOUNTER — Other Ambulatory Visit (HOSPITAL_COMMUNITY): Payer: Self-pay

## 2024-09-11 ENCOUNTER — Other Ambulatory Visit: Payer: Self-pay

## 2024-09-16 ENCOUNTER — Other Ambulatory Visit: Payer: Self-pay | Admitting: Family Medicine

## 2024-09-16 ENCOUNTER — Other Ambulatory Visit: Payer: Self-pay

## 2024-09-16 ENCOUNTER — Other Ambulatory Visit (HOSPITAL_COMMUNITY): Payer: Self-pay

## 2024-09-16 DIAGNOSIS — N319 Neuromuscular dysfunction of bladder, unspecified: Secondary | ICD-10-CM

## 2024-09-16 MED ORDER — LISINOPRIL 10 MG PO TABS
10.0000 mg | ORAL_TABLET | Freq: Every day | ORAL | 3 refills | Status: AC
  Filled 2024-09-16 – 2024-09-30 (×2): qty 90, 90d supply, fill #0
  Filled 2024-11-01: qty 30, 30d supply, fill #0
  Filled 2024-12-01: qty 30, 30d supply, fill #1

## 2024-09-16 MED ORDER — ROSUVASTATIN CALCIUM 40 MG PO TABS
40.0000 mg | ORAL_TABLET | Freq: Every evening | ORAL | 1 refills | Status: AC
  Filled 2024-09-16: qty 90, fill #0
  Filled 2024-09-30: qty 90, 90d supply, fill #0
  Filled 2024-11-01: qty 30, 30d supply, fill #0
  Filled 2024-12-01: qty 30, 30d supply, fill #1

## 2024-09-16 MED ORDER — EZETIMIBE 10 MG PO TABS
10.0000 mg | ORAL_TABLET | Freq: Every day | ORAL | 3 refills | Status: AC
  Filled 2024-09-16: qty 90, 90d supply, fill #0
  Filled 2024-09-30: qty 30, 30d supply, fill #0
  Filled 2024-11-01: qty 30, 30d supply, fill #1
  Filled 2024-12-01: qty 30, 30d supply, fill #2

## 2024-09-16 MED ORDER — JARDIANCE 10 MG PO TABS
10.0000 mg | ORAL_TABLET | Freq: Every day | ORAL | 1 refills | Status: AC
Start: 2024-09-16 — End: ?
  Filled 2024-09-16: qty 23, 23d supply, fill #0
  Filled 2024-09-30: qty 30, 30d supply, fill #1
  Filled 2024-11-01: qty 30, 30d supply, fill #2

## 2024-09-16 MED ORDER — SERTRALINE HCL 100 MG PO TABS
100.0000 mg | ORAL_TABLET | Freq: Every day | ORAL | 1 refills | Status: AC
  Filled 2024-09-16 – 2024-09-30 (×2): qty 90, 90d supply, fill #0
  Filled 2024-11-01: qty 30, 30d supply, fill #1
  Filled 2024-12-01: qty 30, 30d supply, fill #2

## 2024-09-16 MED ORDER — PANTOPRAZOLE SODIUM 40 MG PO TBEC
40.0000 mg | DELAYED_RELEASE_TABLET | Freq: Every day | ORAL | 1 refills | Status: AC
  Filled 2024-09-16 – 2024-09-30 (×2): qty 90, 90d supply, fill #0
  Filled 2024-10-11 (×2): qty 27, 27d supply, fill #0
  Filled 2024-11-01: qty 30, 30d supply, fill #1
  Filled 2024-12-01: qty 30, 30d supply, fill #2

## 2024-09-16 MED ORDER — METOPROLOL SUCCINATE ER 25 MG PO TB24
25.0000 mg | ORAL_TABLET | Freq: Two times a day (BID) | ORAL | 1 refills | Status: AC
  Filled 2024-09-16: qty 46, 23d supply, fill #0
  Filled 2024-09-30: qty 60, 30d supply, fill #1
  Filled 2024-11-01: qty 60, 30d supply, fill #2
  Filled 2024-12-01: qty 60, 30d supply, fill #3

## 2024-09-16 MED ORDER — TORSEMIDE 20 MG PO TABS
ORAL_TABLET | ORAL | 1 refills | Status: AC
  Filled 2024-09-16: qty 180, 90d supply, fill #0

## 2024-09-16 MED ORDER — TAMSULOSIN HCL 0.4 MG PO CAPS
0.8000 mg | ORAL_CAPSULE | Freq: Every day | ORAL | 2 refills | Status: AC
  Filled 2024-09-16: qty 180, 90d supply, fill #0
  Filled 2024-09-30: qty 18, 9d supply, fill #0
  Filled 2024-09-30: qty 180, 90d supply, fill #0
  Filled 2024-09-30: qty 60, 30d supply, fill #1
  Filled 2024-11-01: qty 60, 30d supply, fill #2
  Filled 2024-12-01: qty 60, 30d supply, fill #3

## 2024-09-16 MED ORDER — LEVOTHYROXINE SODIUM 100 MCG PO TABS
100.0000 ug | ORAL_TABLET | Freq: Every day | ORAL | 3 refills | Status: AC
  Filled 2024-09-16 – 2024-09-30 (×2): qty 90, 90d supply, fill #0
  Filled 2024-11-01: qty 30, 30d supply, fill #1
  Filled 2024-12-01: qty 30, 30d supply, fill #2

## 2024-09-16 MED FILL — Baclofen Tab 10 MG: ORAL | 30 days supply | Qty: 90 | Fill #0 | Status: AC

## 2024-09-16 NOTE — Telephone Encounter (Unsigned)
 Copied from CRM #[id number]. Topic: Clinical - Medication Refill >> Sep 16, 2024 11:18 AM Olam RAMAN wrote: Medication: JARDIANCE 10 MG TABS tablet baclofen  (LIORESAL ) 10 MG tablet   Has the patient contacted their pharmacy? Yes (Agent: If no, request that the patient contact the pharmacy for the refill. If patient does not wish to contact the pharmacy document the reason why and proceed with request.) (Agent: If yes, when and what did the pharmacy advise?)  This is the patient's preferred pharmacy:  [id number] - [place], [place] - [address] AT [address] OF [address] [place] [address] Phone: [phone] Fax: [phone]  [facility] [phone] - [place], [place] - [address] AT [facility] [address]  [place] [address] Phone: [phone] Fax: [phone]  Is this the correct pharmacy for this prescription? Yes If no, delete pharmacy and type the correct one.   Has the prescription been filled recently? No  Is the patient out of the medication? Yes  Has the patient been seen for an appointment in the last year OR does the patient have an upcoming appointment? Yes  Can we respond through MyChart? Yes  Agent: Please be advised that Rx refills may take up to 3 business days. We ask that you follow-up with your pharmacy.

## 2024-09-17 ENCOUNTER — Other Ambulatory Visit: Payer: Self-pay

## 2024-09-17 ENCOUNTER — Other Ambulatory Visit (HOSPITAL_COMMUNITY): Payer: Self-pay

## 2024-09-17 MED ORDER — AMIODARONE HCL 200 MG PO TABS
200.0000 mg | ORAL_TABLET | Freq: Every day | ORAL | 1 refills | Status: AC
  Filled 2024-09-17: qty 90, 90d supply, fill #0
  Filled 2024-09-30: qty 30, 30d supply, fill #0
  Filled 2024-11-01: qty 30, 30d supply, fill #1
  Filled 2024-12-01: qty 30, 30d supply, fill #2

## 2024-09-19 ENCOUNTER — Other Ambulatory Visit: Payer: Self-pay

## 2024-09-23 ENCOUNTER — Other Ambulatory Visit: Payer: Self-pay

## 2024-09-25 ENCOUNTER — Other Ambulatory Visit: Payer: Self-pay

## 2024-09-30 ENCOUNTER — Other Ambulatory Visit: Payer: Self-pay

## 2024-09-30 ENCOUNTER — Other Ambulatory Visit (HOSPITAL_COMMUNITY): Payer: Self-pay

## 2024-10-01 ENCOUNTER — Other Ambulatory Visit: Payer: Self-pay

## 2024-10-02 ENCOUNTER — Other Ambulatory Visit (HOSPITAL_COMMUNITY): Payer: Self-pay

## 2024-10-07 ENCOUNTER — Other Ambulatory Visit: Payer: Self-pay

## 2024-10-07 ENCOUNTER — Other Ambulatory Visit (HOSPITAL_COMMUNITY): Payer: Self-pay

## 2024-10-08 ENCOUNTER — Encounter: Payer: Self-pay | Admitting: Family Medicine

## 2024-10-09 ENCOUNTER — Encounter: Payer: Self-pay | Admitting: Family Medicine

## 2024-10-09 NOTE — Telephone Encounter (Signed)
 Nurses Celestino is due to yeast Antibiotics will not help I recommend nystatin  oral solution 5 mL swish and swallow 4 times daily for 7 days May prescribe 150 mL with 1 refill

## 2024-10-10 ENCOUNTER — Other Ambulatory Visit: Payer: Self-pay | Admitting: Family Medicine

## 2024-10-10 NOTE — Progress Notes (Signed)
 Patient had recurrence of thrush this was sent to [facility] was instructed if he has returned of symptoms to notify us 

## 2024-10-11 ENCOUNTER — Other Ambulatory Visit (HOSPITAL_COMMUNITY): Payer: Self-pay

## 2024-10-11 ENCOUNTER — Other Ambulatory Visit: Payer: Self-pay

## 2024-11-01 ENCOUNTER — Encounter: Payer: Self-pay | Admitting: Pharmacist

## 2024-11-01 ENCOUNTER — Other Ambulatory Visit: Payer: Self-pay

## 2024-11-04 ENCOUNTER — Other Ambulatory Visit (HOSPITAL_COMMUNITY): Payer: Self-pay

## 2024-11-04 ENCOUNTER — Other Ambulatory Visit: Payer: Self-pay

## 2024-11-11 ENCOUNTER — Other Ambulatory Visit (HOSPITAL_COMMUNITY): Payer: Self-pay

## 2024-11-11 MED FILL — Baclofen Tab 10 MG: ORAL | 30 days supply | Qty: 90 | Fill #1 | Status: AC

## 2024-12-01 ENCOUNTER — Other Ambulatory Visit: Payer: Self-pay

## 2024-12-25 ENCOUNTER — Ambulatory Visit: Admitting: Family Medicine

## 2025-07-18 ENCOUNTER — Ambulatory Visit
